# Patient Record
Sex: Male | Born: 1940 | Race: White | Hispanic: No | Marital: Married | Smoking: Never smoker
Health system: Southern US, Community
[De-identification: ages and names within clinical notes are randomized; demographics above are authoritative.]

## PROBLEM LIST (undated history)

## (undated) DIAGNOSIS — D631 Anemia in chronic kidney disease: Secondary | ICD-10-CM

## (undated) DIAGNOSIS — E669 Obesity, unspecified: Secondary | ICD-10-CM

## (undated) DIAGNOSIS — I272 Pulmonary hypertension, unspecified: Secondary | ICD-10-CM

## (undated) DIAGNOSIS — I10 Essential (primary) hypertension: Secondary | ICD-10-CM

## (undated) DIAGNOSIS — D649 Anemia, unspecified: Secondary | ICD-10-CM

## (undated) DIAGNOSIS — Z8679 Personal history of other diseases of the circulatory system: Secondary | ICD-10-CM

## (undated) DIAGNOSIS — K219 Gastro-esophageal reflux disease without esophagitis: Secondary | ICD-10-CM

## (undated) DIAGNOSIS — I5032 Chronic diastolic (congestive) heart failure: Secondary | ICD-10-CM

## (undated) DIAGNOSIS — R51 Headache: Secondary | ICD-10-CM

## (undated) DIAGNOSIS — N189 Chronic kidney disease, unspecified: Secondary | ICD-10-CM

## (undated) DIAGNOSIS — N183 Chronic kidney disease, stage 3 unspecified: Secondary | ICD-10-CM

## (undated) DIAGNOSIS — E291 Testicular hypofunction: Secondary | ICD-10-CM

## (undated) DIAGNOSIS — I2699 Other pulmonary embolism without acute cor pulmonale: Secondary | ICD-10-CM

## (undated) DIAGNOSIS — Z8719 Personal history of other diseases of the digestive system: Secondary | ICD-10-CM

## (undated) DIAGNOSIS — K279 Peptic ulcer, site unspecified, unspecified as acute or chronic, without hemorrhage or perforation: Secondary | ICD-10-CM

## (undated) DIAGNOSIS — R519 Headache, unspecified: Secondary | ICD-10-CM

## (undated) DIAGNOSIS — E271 Primary adrenocortical insufficiency: Secondary | ICD-10-CM

## (undated) DIAGNOSIS — I2510 Atherosclerotic heart disease of native coronary artery without angina pectoris: Secondary | ICD-10-CM

## (undated) DIAGNOSIS — E079 Disorder of thyroid, unspecified: Secondary | ICD-10-CM

## (undated) DIAGNOSIS — Z9289 Personal history of other medical treatment: Secondary | ICD-10-CM

## (undated) DIAGNOSIS — G8929 Other chronic pain: Secondary | ICD-10-CM

## (undated) DIAGNOSIS — K922 Gastrointestinal hemorrhage, unspecified: Secondary | ICD-10-CM

## (undated) DIAGNOSIS — K759 Inflammatory liver disease, unspecified: Secondary | ICD-10-CM

## (undated) DIAGNOSIS — M549 Dorsalgia, unspecified: Secondary | ICD-10-CM

## (undated) HISTORY — DX: Essential (primary) hypertension: I10

## (undated) HISTORY — DX: Anemia in chronic kidney disease: D63.1

## (undated) HISTORY — PX: TONSILLECTOMY: SUR1361

## (undated) HISTORY — DX: Personal history of other medical treatment: Z92.89

## (undated) HISTORY — DX: Chronic kidney disease, stage 3 unspecified: N18.30

## (undated) HISTORY — DX: Disorder of thyroid, unspecified: E07.9

## (undated) HISTORY — DX: Chronic kidney disease, stage 3 (moderate): N18.3

## (undated) HISTORY — DX: Chronic diastolic (congestive) heart failure: I50.32

## (undated) HISTORY — DX: Pulmonary hypertension, unspecified: I27.20

## (undated) HISTORY — PX: CHOLECYSTECTOMY: SHX55

## (undated) HISTORY — DX: Personal history of other diseases of the circulatory system: Z86.79

## (undated) HISTORY — DX: Chronic kidney disease, unspecified: N18.9

## (undated) HISTORY — DX: Primary adrenocortical insufficiency: E27.1

## (undated) HISTORY — DX: Testicular hypofunction: E29.1

## (undated) HISTORY — DX: Other pulmonary embolism without acute cor pulmonale: I26.99

## (undated) MED FILL — Dextrose Inj 5%: INTRAVENOUS | Qty: 1000 | Status: AC

---
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
## 1999-07-23 ENCOUNTER — Encounter: Admission: RE | Admit: 1999-07-23 | Discharge: 1999-07-23 | Payer: Self-pay | Admitting: Family Medicine

## 1999-07-23 ENCOUNTER — Encounter: Payer: Self-pay | Admitting: Family Medicine

## 1999-07-31 ENCOUNTER — Encounter: Admission: RE | Admit: 1999-07-31 | Discharge: 1999-07-31 | Payer: Self-pay | Admitting: Gastroenterology

## 1999-07-31 ENCOUNTER — Encounter: Payer: Self-pay | Admitting: Gastroenterology

## 1999-11-25 ENCOUNTER — Encounter: Payer: Self-pay | Admitting: Emergency Medicine

## 1999-11-25 ENCOUNTER — Emergency Department (HOSPITAL_COMMUNITY): Admission: EM | Admit: 1999-11-25 | Discharge: 1999-11-25 | Payer: Self-pay | Admitting: Emergency Medicine

## 1999-11-27 ENCOUNTER — Ambulatory Visit (HOSPITAL_COMMUNITY): Admission: RE | Admit: 1999-11-27 | Discharge: 1999-11-27 | Payer: Self-pay | Admitting: Internal Medicine

## 2000-12-06 ENCOUNTER — Encounter: Admission: RE | Admit: 2000-12-06 | Discharge: 2001-01-13 | Payer: Self-pay | Admitting: Obstetrics and Gynecology

## 2002-04-12 ENCOUNTER — Encounter: Payer: Self-pay | Admitting: Otolaryngology

## 2002-04-12 ENCOUNTER — Encounter: Admission: RE | Admit: 2002-04-12 | Discharge: 2002-04-12 | Payer: Self-pay | Admitting: Otolaryngology

## 2002-06-11 ENCOUNTER — Encounter: Payer: Self-pay | Admitting: Gastroenterology

## 2002-06-11 ENCOUNTER — Encounter: Admission: RE | Admit: 2002-06-11 | Discharge: 2002-06-11 | Payer: Self-pay | Admitting: Gastroenterology

## 2002-06-15 ENCOUNTER — Ambulatory Visit (HOSPITAL_COMMUNITY): Admission: RE | Admit: 2002-06-15 | Discharge: 2002-06-15 | Payer: Self-pay | Admitting: Gastroenterology

## 2002-06-15 ENCOUNTER — Encounter: Payer: Self-pay | Admitting: Gastroenterology

## 2002-08-13 ENCOUNTER — Ambulatory Visit (HOSPITAL_COMMUNITY): Admission: RE | Admit: 2002-08-13 | Discharge: 2002-08-13 | Payer: Self-pay | Admitting: Gastroenterology

## 2002-08-13 ENCOUNTER — Encounter: Payer: Self-pay | Admitting: Gastroenterology

## 2002-09-11 ENCOUNTER — Ambulatory Visit (HOSPITAL_COMMUNITY): Admission: RE | Admit: 2002-09-11 | Discharge: 2002-09-11 | Payer: Self-pay | Admitting: Endocrinology

## 2002-09-11 ENCOUNTER — Encounter: Payer: Self-pay | Admitting: Endocrinology

## 2003-04-15 ENCOUNTER — Emergency Department (HOSPITAL_COMMUNITY): Admission: EM | Admit: 2003-04-15 | Discharge: 2003-04-15 | Payer: Self-pay | Admitting: Emergency Medicine

## 2003-04-21 ENCOUNTER — Ambulatory Visit (HOSPITAL_COMMUNITY): Admission: RE | Admit: 2003-04-21 | Discharge: 2003-04-21 | Payer: Self-pay | Admitting: Otolaryngology

## 2003-06-28 ENCOUNTER — Ambulatory Visit (HOSPITAL_COMMUNITY): Admission: RE | Admit: 2003-06-28 | Discharge: 2003-06-28 | Payer: Self-pay | Admitting: Neurology

## 2003-09-14 ENCOUNTER — Emergency Department (HOSPITAL_COMMUNITY): Admission: EM | Admit: 2003-09-14 | Discharge: 2003-09-15 | Payer: Self-pay | Admitting: Emergency Medicine

## 2004-08-03 ENCOUNTER — Encounter: Admission: RE | Admit: 2004-08-03 | Discharge: 2004-08-03 | Payer: Self-pay | Admitting: Gastroenterology

## 2004-10-02 ENCOUNTER — Ambulatory Visit (HOSPITAL_COMMUNITY): Admission: RE | Admit: 2004-10-02 | Discharge: 2004-10-02 | Payer: Self-pay | Admitting: Gastroenterology

## 2004-10-02 ENCOUNTER — Encounter (INDEPENDENT_AMBULATORY_CARE_PROVIDER_SITE_OTHER): Payer: Self-pay | Admitting: *Deleted

## 2005-05-05 ENCOUNTER — Encounter: Admission: RE | Admit: 2005-05-05 | Discharge: 2005-05-05 | Payer: Self-pay | Admitting: Gastroenterology

## 2005-05-18 ENCOUNTER — Encounter: Admission: RE | Admit: 2005-05-18 | Discharge: 2005-05-18 | Payer: Self-pay | Admitting: Gastroenterology

## 2006-04-01 ENCOUNTER — Encounter: Admission: RE | Admit: 2006-04-01 | Discharge: 2006-04-01 | Payer: Self-pay | Admitting: Family Medicine

## 2006-04-05 ENCOUNTER — Encounter: Admission: RE | Admit: 2006-04-05 | Discharge: 2006-04-05 | Payer: Self-pay | Admitting: Family Medicine

## 2006-04-15 ENCOUNTER — Encounter: Admission: RE | Admit: 2006-04-15 | Discharge: 2006-04-15 | Payer: Self-pay | Admitting: General Surgery

## 2006-05-05 ENCOUNTER — Ambulatory Visit (HOSPITAL_COMMUNITY): Admission: RE | Admit: 2006-05-05 | Discharge: 2006-05-05 | Payer: Self-pay | Admitting: General Surgery

## 2006-05-05 ENCOUNTER — Encounter (INDEPENDENT_AMBULATORY_CARE_PROVIDER_SITE_OTHER): Payer: Self-pay | Admitting: Specialist

## 2006-05-18 ENCOUNTER — Encounter: Admission: RE | Admit: 2006-05-18 | Discharge: 2006-05-18 | Payer: Self-pay | Admitting: Surgery

## 2006-09-29 ENCOUNTER — Ambulatory Visit (HOSPITAL_COMMUNITY): Admission: RE | Admit: 2006-09-29 | Discharge: 2006-09-30 | Payer: Self-pay | Admitting: Endocrinology

## 2007-03-11 ENCOUNTER — Encounter: Admission: RE | Admit: 2007-03-11 | Discharge: 2007-03-11 | Payer: Self-pay | Admitting: Orthopedic Surgery

## 2007-03-21 ENCOUNTER — Ambulatory Visit (HOSPITAL_COMMUNITY): Admission: RE | Admit: 2007-03-21 | Discharge: 2007-03-22 | Payer: Self-pay | Admitting: Orthopedic Surgery

## 2007-04-30 ENCOUNTER — Ambulatory Visit: Payer: Self-pay | Admitting: Internal Medicine

## 2007-05-08 LAB — CBC WITH DIFFERENTIAL/PLATELET
BASO%: 1 % (ref 0.0–2.0)
Eosinophils Absolute: 0.6 10*3/uL — ABNORMAL HIGH (ref 0.0–0.5)
LYMPH%: 35.8 % (ref 14.0–48.0)
MCHC: 35.2 g/dL (ref 32.0–35.9)
MCV: 88.6 fL (ref 81.6–98.0)
MONO%: 8.8 % (ref 0.0–13.0)
Platelets: 275 10*3/uL (ref 145–400)
RBC: 5.36 10*6/uL (ref 4.20–5.71)

## 2007-05-08 LAB — COMPREHENSIVE METABOLIC PANEL
Alkaline Phosphatase: 83 U/L (ref 39–117)
Glucose, Bld: 107 mg/dL — ABNORMAL HIGH (ref 70–99)
Sodium: 139 mEq/L (ref 135–145)
Total Bilirubin: 0.5 mg/dL (ref 0.3–1.2)
Total Protein: 6.9 g/dL (ref 6.0–8.3)

## 2007-05-09 LAB — LEUKOCYTE ALKALINE PHOS: Leukocyte Alkaline  Phos Stain: 150 — ABNORMAL HIGH (ref 22–124)

## 2007-05-22 LAB — CBC WITH DIFFERENTIAL/PLATELET
Basophils Absolute: 0 10*3/uL (ref 0.0–0.1)
Eosinophils Absolute: 0.6 10*3/uL — ABNORMAL HIGH (ref 0.0–0.5)
HGB: 15.1 g/dL (ref 13.0–17.1)
MCV: 88.6 fL (ref 81.6–98.0)
MONO#: 0.8 10*3/uL (ref 0.1–0.9)
NEUT#: 4.9 10*3/uL (ref 1.5–6.5)
RDW: 13.6 % (ref 11.2–14.6)
WBC: 9.6 10*3/uL (ref 4.0–10.0)
lymph#: 3.3 10*3/uL (ref 0.9–3.3)

## 2007-06-08 ENCOUNTER — Ambulatory Visit (HOSPITAL_COMMUNITY): Admission: RE | Admit: 2007-06-08 | Discharge: 2007-06-08 | Payer: Self-pay | Admitting: Orthopedic Surgery

## 2008-02-15 ENCOUNTER — Encounter: Admission: RE | Admit: 2008-02-15 | Discharge: 2008-02-15 | Payer: Self-pay | Admitting: Family Medicine

## 2008-04-22 ENCOUNTER — Encounter: Admission: RE | Admit: 2008-04-22 | Discharge: 2008-04-22 | Payer: Self-pay | Admitting: Family Medicine

## 2008-06-20 ENCOUNTER — Inpatient Hospital Stay (HOSPITAL_COMMUNITY): Admission: EM | Admit: 2008-06-20 | Discharge: 2008-06-27 | Payer: Self-pay | Admitting: Emergency Medicine

## 2008-06-20 ENCOUNTER — Ambulatory Visit: Payer: Self-pay | Admitting: Cardiology

## 2008-06-21 ENCOUNTER — Encounter: Payer: Self-pay | Admitting: Internal Medicine

## 2008-06-21 ENCOUNTER — Encounter (INDEPENDENT_AMBULATORY_CARE_PROVIDER_SITE_OTHER): Payer: Self-pay | Admitting: Internal Medicine

## 2008-06-25 ENCOUNTER — Encounter: Payer: Self-pay | Admitting: Cardiovascular Disease

## 2008-06-26 HISTORY — PX: CARDIAC CATHETERIZATION: SHX172

## 2008-07-16 ENCOUNTER — Encounter
Admission: RE | Admit: 2008-07-16 | Discharge: 2008-08-06 | Payer: Self-pay | Admitting: Physical Medicine & Rehabilitation

## 2008-07-22 ENCOUNTER — Ambulatory Visit: Payer: Self-pay | Admitting: Physical Medicine & Rehabilitation

## 2008-08-01 ENCOUNTER — Encounter: Admission: RE | Admit: 2008-08-01 | Discharge: 2008-08-01 | Payer: Self-pay | Admitting: Anesthesiology

## 2008-08-06 ENCOUNTER — Ambulatory Visit: Payer: Self-pay | Admitting: Physical Medicine & Rehabilitation

## 2008-09-16 ENCOUNTER — Encounter: Admission: RE | Admit: 2008-09-16 | Discharge: 2008-09-16 | Payer: Self-pay | Admitting: Family Medicine

## 2009-03-11 ENCOUNTER — Ambulatory Visit: Payer: Self-pay | Admitting: Internal Medicine

## 2009-03-23 ENCOUNTER — Encounter: Admission: RE | Admit: 2009-03-23 | Discharge: 2009-03-23 | Payer: Self-pay | Admitting: Neurosurgery

## 2009-04-22 ENCOUNTER — Ambulatory Visit (HOSPITAL_COMMUNITY): Admission: RE | Admit: 2009-04-22 | Discharge: 2009-04-22 | Payer: Self-pay | Admitting: Internal Medicine

## 2009-04-22 ENCOUNTER — Encounter: Payer: Self-pay | Admitting: Internal Medicine

## 2009-05-08 ENCOUNTER — Ambulatory Visit: Payer: Self-pay | Admitting: Internal Medicine

## 2009-05-08 DIAGNOSIS — E039 Hypothyroidism, unspecified: Secondary | ICD-10-CM

## 2009-05-08 DIAGNOSIS — Z9189 Other specified personal risk factors, not elsewhere classified: Secondary | ICD-10-CM | POA: Insufficient documentation

## 2009-05-08 DIAGNOSIS — R0609 Other forms of dyspnea: Secondary | ICD-10-CM

## 2009-05-08 DIAGNOSIS — I5022 Chronic systolic (congestive) heart failure: Secondary | ICD-10-CM

## 2009-05-08 DIAGNOSIS — D510 Vitamin B12 deficiency anemia due to intrinsic factor deficiency: Secondary | ICD-10-CM

## 2010-01-30 ENCOUNTER — Ambulatory Visit: Payer: Self-pay | Admitting: Cardiovascular Disease

## 2010-02-16 ENCOUNTER — Encounter: Payer: Self-pay | Admitting: Family Medicine
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
## 2010-04-06 ENCOUNTER — Ambulatory Visit (INDEPENDENT_AMBULATORY_CARE_PROVIDER_SITE_OTHER): Payer: BC Managed Care – PPO | Admitting: Nurse Practitioner

## 2010-04-06 DIAGNOSIS — R0602 Shortness of breath: Secondary | ICD-10-CM

## 2010-04-06 DIAGNOSIS — I509 Heart failure, unspecified: Secondary | ICD-10-CM

## 2010-04-09 ENCOUNTER — Encounter: Payer: Self-pay | Admitting: Internal Medicine

## 2010-04-09 ENCOUNTER — Ambulatory Visit (HOSPITAL_COMMUNITY): Payer: BC Managed Care – PPO | Attending: Internal Medicine

## 2010-04-09 DIAGNOSIS — E2749 Other adrenocortical insufficiency: Secondary | ICD-10-CM | POA: Insufficient documentation

## 2010-04-09 DIAGNOSIS — I10 Essential (primary) hypertension: Secondary | ICD-10-CM | POA: Insufficient documentation

## 2010-04-09 DIAGNOSIS — R0989 Other specified symptoms and signs involving the circulatory and respiratory systems: Secondary | ICD-10-CM | POA: Insufficient documentation

## 2010-04-09 DIAGNOSIS — R0609 Other forms of dyspnea: Secondary | ICD-10-CM | POA: Insufficient documentation

## 2010-04-09 DIAGNOSIS — E039 Hypothyroidism, unspecified: Secondary | ICD-10-CM | POA: Insufficient documentation

## 2010-04-09 DIAGNOSIS — I519 Heart disease, unspecified: Secondary | ICD-10-CM | POA: Insufficient documentation

## 2010-04-09 DIAGNOSIS — I509 Heart failure, unspecified: Secondary | ICD-10-CM | POA: Insufficient documentation

## 2010-04-09 HISTORY — PX: US ECHOCARDIOGRAPHY: HXRAD669

## 2010-04-16 ENCOUNTER — Encounter: Payer: Self-pay | Admitting: Cardiovascular Disease

## 2010-04-16 ENCOUNTER — Ambulatory Visit (INDEPENDENT_AMBULATORY_CARE_PROVIDER_SITE_OTHER): Payer: BC Managed Care – PPO | Admitting: Cardiovascular Disease

## 2010-04-16 VITALS — BP 108/72 | HR 88 | Resp 20 | Wt 211.0 lb

## 2010-04-16 DIAGNOSIS — E291 Testicular hypofunction: Secondary | ICD-10-CM | POA: Insufficient documentation

## 2010-04-16 DIAGNOSIS — R5383 Other fatigue: Secondary | ICD-10-CM | POA: Insufficient documentation

## 2010-04-16 DIAGNOSIS — I509 Heart failure, unspecified: Secondary | ICD-10-CM

## 2010-04-16 DIAGNOSIS — R5381 Other malaise: Secondary | ICD-10-CM

## 2010-04-16 DIAGNOSIS — N289 Disorder of kidney and ureter, unspecified: Secondary | ICD-10-CM

## 2010-04-16 DIAGNOSIS — E869 Volume depletion, unspecified: Secondary | ICD-10-CM | POA: Insufficient documentation

## 2010-04-16 NOTE — Progress Notes (Signed)
History of Present Illness:  [name] is a middle-aged gentleman with a history of congestive heart failure and pulmonary hypertension. He also has Addison's disease, hypothyroidism, and hypogonadism. He also has a history of a pituitary tumor but was thought not to have complete pituitary failure. He was seen by [name] last week for fatigue and shortness of breath. His Lasix was increased from 40 mg to 60 mg a day. She ordered an echocardiogram which revealed normal left ventricular systolic function. Labwork at that time revealed a white blood cell count of 12.5 ( slightly elevated because of his steroid therapy), a creatinine of 1.99, BUN 27, and a B natruretic peptide of 2.2.   He presents today for followup.  Current medications: Note:  The patient was on Voltaren when he was seen today.  This medication was discontinued today at this visit. Current Outpatient Prescriptions  Medication Sig Dispense Refill  . aspirin 81 MG tablet Take 81 mg by mouth daily.        . carvedilol (COREG) 25 MG tablet Take 25 mg by mouth 2 (two) times daily with a meal.       . Cholecalciferol (VITAMIN D3 PO) Take by mouth.        . cyclobenzaprine (FLEXERIL) 10 MG tablet       . furosemide (LASIX) 40 MG tablet Take 60 mg by mouth daily.       . hydrocortisone (CORTEF) 20 MG tablet Take 20 mg by mouth daily.        . hydrOXYzine (VISTARIL) 25 MG capsule Take 25 mg by mouth 3 (three) times daily as needed.        Marland Kitchen lisinopril (PRINIVIL,ZESTRIL) 20 MG tablet Take 20 mg by mouth daily.        . metoCLOPramide (REGLAN) 10 MG tablet Take 10 mg by mouth 4 (four) times daily.        Marland Kitchen omeprazole (PRILOSEC) 20 MG capsule Take 20 mg by mouth daily.        . potassium chloride (K-DUR,KLOR-CON) 10 MEQ tablet Take 10 mEq by mouth 2 (two) times daily.        . promethazine (PHENERGAN) 25 MG tablet Take 25 mg by mouth every 6 (six) hours as needed.        . tadalafil (CIALIS) 5 MG tablet Take 5 mg by mouth daily as  needed.        . thyroid (ARMOUR) 60 MG tablet Take 60 mg by mouth daily.        . traMADol (ULTRAM) 50 MG tablet Take 50 mg by mouth every 6 (six) hours as needed.        . traZODone (DESYREL) 100 MG tablet Take 100 mg by mouth at bedtime.        Marland Kitchen DISCONTD: diclofenac (VOLTAREN) 75 MG EC tablet Take 75 mg by mouth 2 (two) times daily.           No Known Allergies  Past Medical History  Diagnosis Date  . CHF (congestive heart failure)   . Thyroid disease     addisons  . Pulmonary HTN   . Hypertension   . Addison's disease   . Hypogonadism male     Past Surgical History  Procedure Date  . Cholecystectomy     History  Smoking status  . Never Smoker   Smokeless tobacco  . Not on file    History  Alcohol Use No    History reviewed. No pertinent family  history.  Review of Systems: The patient denies any heat or cold intolerance.  No weight gain or weight loss.  The patient denies headaches or blurry vision.  There is no cough or sputum production.  The patient denies dizziness.  There is no hematuria or hematochezia.  The patient denies any muscle aches or arthritis.  The patient denies any rash.  The patient denies frequent falling or instability.  There is no history of depression or anxiety.  All other systems were reviewed and are negative.  Physical Exam: BP 108/72  Pulse 88  Resp 20  The patient is alert and oriented x 3.  The mood and affect are normal.  The skin is warm and dry.  Color is normal.  The HEENT exam reveals that the sclera are nonicteric.  The mucous membranes are dryer than normal.  The carotids are 2+ without bruits.  There is no thyromegaly.  There is no JVD.  The lungs are clear.  The chest wall is non tender.  The heart exam reveals a regular rate with a normal S1 and S2.  There are no murmurs, gallops, or rubs.  The PMI is not displaced.   Abdominal exam reveals good bowel sounds.  There is no guarding or rebound.  There is no hepatosplenomegaly  or tenderness.  There are no masses.  Exam of the legs reveal no clubbing, cyanosis, or edema. Skin turgor is slightly diminished.   The legs are without rashes.  The distal pulses are intact.  Cranial nerves II - XII are intact.  Motor and sensory functions are intact.  The gait is normal.  Have reviewed his echocardiogram from April 09, 1998. He reveals normal left ventricular systolic function with an ejection fraction of 60-65%. His previous echocardiogram from 2000 and reveals an ejection fraction of 30-35%. This was corroborated by left ventriculogram at the time of heart catheterization in 2010  Labs: Creatinine of 1.99, BUN equals 27,BNP is 2.2. Assessment / Plan:

## 2010-04-16 NOTE — Patient Instructions (Signed)
Hold Furosemide, potassium, and voltaren.

## 2010-04-16 NOTE — Assessment & Plan Note (Signed)
[name] presents with fatigue, dehydration, elevated creatinine, and a very low BNP. He's been thirsty for the past several weeks. I suspect that he is active volume depleted. This is the most likely cause of his renal insufficiency. He's also on Voltaren which can cause renal insufficiency. We will discontinue the Voltaren.We will hold the Lasix and potassium. I'll see him in 2 weeks for office visit, basic metabolic profile, and BNP.

## 2010-04-16 NOTE — Assessment & Plan Note (Signed)
His congestive heart failure has resolved. His left ventricular systolic function has improved from 35% to 60% on medical therapy.  We will continue with his same medications since they have improved his left ventricular systolic function.

## 2010-04-16 NOTE — Assessment & Plan Note (Signed)
I suspect that his profound fatigue is due to volume depletion and renal insufficiency. We have discontinued the Voltaren and we'll hold the Lasix and potassium for the time being to see if this helps.It's also possible that he needs additional cortisol for his Addison's disease. We'll see if he improves with correction of his volume and will have him see his endocrinologist if needed.

## 2010-04-16 NOTE — Assessment & Plan Note (Signed)
The Lasix has been held. We will also hold his potassium.

## 2010-05-04 ENCOUNTER — Encounter: Payer: Self-pay | Admitting: Cardiovascular Disease

## 2010-05-04 ENCOUNTER — Encounter: Payer: Self-pay | Admitting: *Deleted

## 2010-05-04 ENCOUNTER — Other Ambulatory Visit: Payer: Self-pay | Admitting: Cardiovascular Disease

## 2010-05-04 DIAGNOSIS — I509 Heart failure, unspecified: Secondary | ICD-10-CM

## 2010-05-04 DIAGNOSIS — R0602 Shortness of breath: Secondary | ICD-10-CM

## 2010-05-04 LAB — POCT I-STAT 3, VENOUS BLOOD GAS (G3P V)
Bicarbonate: 21.7 mEq/L (ref 20.0–24.0)
O2 Saturation: 57 %
TCO2: 23 mmol/L (ref 0–100)
pCO2, Ven: 36 mmHg — ABNORMAL LOW (ref 45.0–50.0)
pH, Ven: 7.388 — ABNORMAL HIGH (ref 7.250–7.300)
pO2, Ven: 30 mmHg (ref 30.0–45.0)

## 2010-05-04 LAB — CBC
MCHC: 33.4 g/dL (ref 30.0–36.0)
Platelets: 340 10*3/uL (ref 150–400)
Platelets: 352 10*3/uL (ref 150–400)
RDW: 17.3 % — ABNORMAL HIGH (ref 11.5–15.5)
RDW: 17.7 % — ABNORMAL HIGH (ref 11.5–15.5)
WBC: 14.8 10*3/uL — ABNORMAL HIGH (ref 4.0–10.5)

## 2010-05-04 LAB — BASIC METABOLIC PANEL
BUN: 10 mg/dL (ref 6–23)
BUN: 20 mg/dL (ref 6–23)
CO2: 24 mEq/L (ref 19–32)
CO2: 28 mEq/L (ref 19–32)
Calcium: 8.6 mg/dL (ref 8.4–10.5)
Chloride: 104 mEq/L (ref 96–112)
Creatinine, Ser: 0.84 mg/dL (ref 0.4–1.5)
Creatinine, Ser: 1.37 mg/dL (ref 0.4–1.5)
GFR calc non Af Amer: >60 mL/min (ref >60)
Glucose, Bld: 86 mg/dL (ref 70–99)
Potassium: 4.5 mEq/L (ref 3.5–5.1)
Sodium: 140 mEq/L (ref 135–145)

## 2010-05-04 LAB — POCT I-STAT 3, ART BLOOD GAS (G3+)
O2 Saturation: 96 %
pCO2 arterial: 29.9 mmHg — ABNORMAL LOW (ref 35.0–45.0)
pH, Arterial: 7.435 (ref 7.350–7.450)
pO2, Arterial: 75 mmHg — ABNORMAL LOW (ref 80.0–100.0)

## 2010-05-05 ENCOUNTER — Encounter: Payer: Self-pay | Admitting: Cardiovascular Disease

## 2010-05-05 ENCOUNTER — Ambulatory Visit (INDEPENDENT_AMBULATORY_CARE_PROVIDER_SITE_OTHER): Payer: BC Managed Care – PPO | Admitting: Cardiovascular Disease

## 2010-05-05 VITALS — BP 120/80 | HR 90 | Wt 209.0 lb

## 2010-05-05 DIAGNOSIS — E860 Dehydration: Secondary | ICD-10-CM

## 2010-05-05 DIAGNOSIS — I509 Heart failure, unspecified: Secondary | ICD-10-CM

## 2010-05-05 DIAGNOSIS — R0602 Shortness of breath: Secondary | ICD-10-CM

## 2010-05-05 LAB — T4, FREE: Free T4: 0.65 ng/dL — ABNORMAL LOW (ref 0.80–1.80)

## 2010-05-05 LAB — CBC
HCT: 43.5 % (ref 39.0–52.0)
HCT: 45.1 % (ref 39.0–52.0)
Hemoglobin: 14.3 g/dL (ref 13.0–17.0)
Hemoglobin: 14.5 g/dL (ref 13.0–17.0)
Hemoglobin: 15.1 g/dL (ref 13.0–17.0)
Hemoglobin: 15.8 g/dL (ref 13.0–17.0)
MCHC: 32.9 g/dL (ref 30.0–36.0)
MCHC: 33.3 g/dL (ref 30.0–36.0)
MCHC: 33.3 g/dL (ref 30.0–36.0)
MCHC: 33.4 g/dL (ref 30.0–36.0)
MCV: 86.9 fL (ref 78.0–100.0)
MCV: 87.3 fL (ref 78.0–100.0)
RBC: 4.97 MIL/uL (ref 4.22–5.81)
RBC: 5.5 MIL/uL (ref 4.22–5.81)
RDW: 17.1 % — ABNORMAL HIGH (ref 11.5–15.5)
RDW: 17.2 % — ABNORMAL HIGH (ref 11.5–15.5)
RDW: 17.4 % — ABNORMAL HIGH (ref 11.5–15.5)
WBC: 11.7 10*3/uL — ABNORMAL HIGH (ref 4.0–10.5)

## 2010-05-05 LAB — POCT I-STAT 3, ART BLOOD GAS (G3+)
Bicarbonate: 21.4 mEq/L (ref 20.0–24.0)
Patient temperature: 97.4
TCO2: 23 mmol/L (ref 0–100)
pCO2 arterial: 37 mmHg (ref 35.0–45.0)
pH, Arterial: 7.368 (ref 7.350–7.450)

## 2010-05-05 LAB — DIFFERENTIAL
Basophils Absolute: 0.1 10*3/uL (ref 0.0–0.1)
Basophils Relative: 0 % (ref 0–1)
Basophils Relative: 1 % (ref 0–1)
Eosinophils Absolute: 0.5 10*3/uL (ref 0.0–0.7)
Lymphs Abs: 3 10*3/uL (ref 0.7–4.0)
Monocytes Absolute: 0.9 10*3/uL (ref 0.1–1.0)
Monocytes Relative: 7 % (ref 3–12)
Monocytes Relative: 7 % (ref 3–12)
Neutro Abs: 8.1 10*3/uL — ABNORMAL HIGH (ref 1.7–7.7)
Neutrophils Relative %: 50 % (ref 43–77)

## 2010-05-05 LAB — APTT: aPTT: 35 seconds (ref 24–37)

## 2010-05-05 LAB — COMPREHENSIVE METABOLIC PANEL
ALT: 15 U/L (ref 0–53)
Alkaline Phosphatase: 72 U/L (ref 39–117)
CO2: 26 mEq/L (ref 19–32)
Chloride: 107 mEq/L (ref 96–112)
Glucose, Bld: 98 mg/dL (ref 70–99)
Potassium: 5 mEq/L (ref 3.5–5.1)
Sodium: 140 mEq/L (ref 135–145)
Total Bilirubin: 1.2 mg/dL (ref 0.3–1.2)
Total Protein: 6.8 g/dL (ref 6.0–8.3)

## 2010-05-05 LAB — BASIC METABOLIC PANEL
BUN: 19 mg/dL (ref 6–23)
CO2: 25 mEq/L (ref 19–32)
CO2: 28 mEq/L (ref 19–32)
Calcium: 8.4 mg/dL (ref 8.4–10.5)
Calcium: 8.7 mg/dL (ref 8.4–10.5)
Chloride: 108 mEq/L (ref 96–112)
Creatinine, Ser: 1.9 mg/dL — ABNORMAL HIGH (ref 0.4–1.5)
GFR calc Af Amer: >60 mL/min (ref >60)
GFR: 37.97 mL/min — ABNORMAL LOW (ref >60.00)
Glucose, Bld: 115 mg/dL — ABNORMAL HIGH (ref 70–99)
Potassium: 4.3 mEq/L (ref 3.5–5.1)
Sodium: 140 mEq/L (ref 135–145)
Sodium: 142 mEq/L (ref 135–145)

## 2010-05-05 LAB — HEMOGLOBIN A1C
Hgb A1c MFr Bld: 6.1 % (ref 4.6–6.1)
Mean Plasma Glucose: 128 mg/dL

## 2010-05-05 LAB — LIPID PANEL
Cholesterol: 180 mg/dL (ref 0–200)
HDL: 31.3 mg/dL — ABNORMAL LOW (ref >39.00)
LDL Cholesterol: 92 mg/dL (ref 0–99)
LDL Cholesterol: 123 mg/dL — ABNORMAL HIGH (ref 0–99)
Total CHOL/HDL Ratio: 5.8 RATIO
Triglycerides: 133 mg/dL (ref <150)
Triglycerides: 128 mg/dL (ref 0.0–149.0)
VLDL: 27 mg/dL (ref 0–40)
VLDL: 25.6 mg/dL (ref 0.0–40.0)

## 2010-05-05 LAB — HEPATIC FUNCTION PANEL: Total Bilirubin: 0.6 mg/dL (ref 0.3–1.2)

## 2010-05-05 LAB — CK TOTAL AND CKMB (NOT AT ARMC): Total CK: 53 U/L (ref 7–232)

## 2010-05-05 LAB — PROTIME-INR: INR: 1.1 (ref 0.00–1.49)

## 2010-05-05 LAB — CARDIAC PANEL(CRET KIN+CKTOT+MB+TROPI)
Relative Index: INVALID (ref 0.0–2.5)
Troponin I: 0.03 ng/mL (ref 0.00–0.06)

## 2010-05-05 NOTE — Assessment & Plan Note (Signed)
[name] seems to be doing quite a bit better since we held his Lasix and potassium. We also had him hold his Voltaren. Hopefully his renal function has improved. He has labs were drawn today that are currently pending. We will have him continue to hold the Lasix and potassium for now. 

## 2010-05-05 NOTE — Assessment & Plan Note (Signed)
[name] seems to be doing quite a bit better since we held his Lasix and potassium. We also had him hold his Voltaren. Hopefully his renal function has improved. He has labs were drawn today that are currently pending. We will have him continue to hold the Lasix and potassium for now.

## 2010-05-05 NOTE — Assessment & Plan Note (Signed)
His congestive heart failure has resolved. His ejection fraction is now 60-65%. We will continue with his same medications.

## 2010-05-05 NOTE — Progress Notes (Signed)
History of Present Illness:  [name] is a middle-aged gentleman with a history of congestive heart failure and adrenal insufficiency. He presented to me several weeks ago with symptoms consistent with dehydration and possible adrenal insufficiency.  It severe weakness and shortness breath. He had worsening renal insufficiency. We discontinued his Voltaren. We held his Lasix and potassium.  He's feeling much better since that time.  Current Outpatient Prescriptions on File Prior to Visit  Medication Sig Dispense Refill  . aspirin 81 MG tablet Take 81 mg by mouth daily.        . carvedilol (COREG) 25 MG tablet Take 25 mg by mouth 2 (two) times daily with a meal.       . Cholecalciferol (VITAMIN D3 PO) Take by mouth.        . cyclobenzaprine (FLEXERIL) 10 MG tablet       . hydrocortisone (CORTEF) 20 MG tablet Take 20 mg by mouth daily.        . hydrOXYzine (VISTARIL) 25 MG capsule Take 25 mg by mouth 3 (three) times daily as needed.        Marland Kitchen lisinopril (PRINIVIL,ZESTRIL) 20 MG tablet Take 20 mg by mouth daily.        . metoCLOPramide (REGLAN) 10 MG tablet Take 10 mg by mouth 4 (four) times daily.        Marland Kitchen omeprazole (PRILOSEC) 20 MG capsule Take 20 mg by mouth daily.        . promethazine (PHENERGAN) 25 MG tablet Take 25 mg by mouth every 6 (six) hours as needed.        . tadalafil (CIALIS) 5 MG tablet Take 5 mg by mouth daily as needed.        . thyroid (ARMOUR) 60 MG tablet Take 60 mg by mouth daily.        . traMADol (ULTRAM) 50 MG tablet Take 50 mg by mouth every 6 (six) hours as needed.        . traZODone (DESYREL) 100 MG tablet Take 100 mg by mouth at bedtime.        . furosemide (LASIX) 40 MG tablet Take 60 mg by mouth daily.       . potassium chloride (K-DUR,KLOR-CON) 10 MEQ tablet Take 10 mEq by mouth 2 (two) times daily.         Please note that the Lasix and potassium Listed above  have been on hold for the past 2 weeks. No Known Allergies  Past Medical History  Diagnosis Date  .  CHF (congestive heart failure)   . Thyroid disease     addisons  . Pulmonary HTN   . Hypertension   . Addison's disease   . Hypogonadism male   . Renal insufficiency     Past Surgical History  Procedure Date  . Cholecystectomy     History  Smoking status  . Never Smoker   Smokeless tobacco  . Not on file    History  Alcohol Use No    No family history on file.  Reviw of Systems:  Noted in the history of present illness. All other systems are negative. Physical Exam: BP 120/80  Pulse 90  Wt 209 lb (94.802 kg) The patient is alert and oriented x 3.  The mood and affect are normal.  The skin is warm and dry.  Color is normal.  The HEENT exam reveals that the sclera are nonicteric.  The mucous membranes are moist.  The carotids are 2+ without  bruits.  There is no thyromegaly.  There is no JVD.  The lungs are clear.  The chest wall is non tender.  The heart exam reveals a regular rate with a normal S1 and S2.  There are no murmurs, gallops, or rubs.  The PMI is not displaced.   Abdominal exam reveals good bowel sounds.  There is no guarding or rebound.  There is no hepatosplenomegaly or tenderness.  There are no masses.  Exam of the legs reveal no clubbing, cyanosis, or edema.  The legs are without rashes.  The distal pulses are intact.  Cranial nerves II - XII are intact.  Motor and sensory functions are intact.  The gait is normal.  A recent echocardiogram from March 15 reveals a normal left ventricular systolic function with an ejection fraction of 60-65%. He has hypokinesis of the basal inferior and inferoseptal walls. He has mild left ventricular he was found to have trivial aortic insufficiency. His pulmonary artery pressures were normal  Assessment / Plan:

## 2010-05-07 ENCOUNTER — Telehealth: Payer: Self-pay | Admitting: Cardiovascular Disease

## 2010-05-07 NOTE — Telephone Encounter (Signed)
Patient called with lab results. Pt informed to call dr Tenny Craw and set up app to address kidney issues, labs sent to pcp.  Pt verbalized understanding. Alfonso Ramus RN

## 2010-05-11 ENCOUNTER — Other Ambulatory Visit: Payer: Self-pay | Admitting: Family Medicine

## 2010-05-11 DIAGNOSIS — N289 Disorder of kidney and ureter, unspecified: Secondary | ICD-10-CM

## 2010-05-19 ENCOUNTER — Ambulatory Visit
Admission: RE | Admit: 2010-05-19 | Discharge: 2010-05-19 | Disposition: A | Discharge status: Home / Self Care | Payer: BC Managed Care – PPO | Source: Ambulatory Visit | Attending: Family Medicine | Admitting: Family Medicine

## 2010-05-19 DIAGNOSIS — N289 Disorder of kidney and ureter, unspecified: Secondary | ICD-10-CM

## 2010-06-09 NOTE — H&P (Signed)
NAMESHANNON, KIRKENDALL             ACCOUNT NO.:  [id number]   MEDICAL RECORD NO.:  [id number]          PATIENT TYPE:  EMS   LOCATION:  [facility]                         FACILITY:  [facility]   PHYSICIAN:  Michiel Cowboy, MDDATE OF BIRTH:  [birth year]   DATE OF ADMISSION:  06/20/2008  DATE OF DISCHARGE:                              HISTORY & PHYSICAL   PRIMARY CARE PHYSICIAN:  C. Duane Lope, M.D.   CHIEF COMPLAINT:  Shortness of breath.   The patient is a 70 year old gentleman with past medical history  significant for Addison disease, anxiety, GERD, chronic pain who  presents with a few days history of worsening shortness of breath.  He  had been having a lot of congestion lately, initially was able to blow  his nose and now cannot which started to make him feel more and more  short of breath.  He initially scheduled appointment with ENT because he  was very worried that there was something stuck in his nose. ENT thought  that maybe he has a pneumonia and sent to emergency department for  further evaluation.  Here a chest x-ray showed a questionable perihilar  infiltrate.  Of note, the patient is hypoxic on ambulation.  He is short  of breath somewhat at baseline.  No chest pain or pleuritic chest pain  and chest discomfort at all, no nausea, vomiting.  The patient has been  very constipated but looking back it has been a chronic problem.  He  states that he has to use either stool softeners to have a bowel  movement but the patient has a longstanding history of narcotic use.   No fevers, no chills.  He had been having cough but lately became  nonproductive.   Otherwise review of systems only significant for neck pain which he has  chronically.   SOCIAL HISTORY:  The patient does not smoke or drink.   FAMILY HISTORY:  Noncontributory.   ALLERGIES:  No known drug allergies.   MEDICATIONS:  1. Synthroid dose unknown.  2. Testosterone 0.5 mg q. Week.  3. Reglan dose unknown.  4.  OxyContin 40 mg p.o. t.i.d.  5. Diclofenac twice a day.  6. Prevacid.  7. Clonazepam unclear dose, but he takes half a dose in p.m. and twice      as much before bedtime.  8. Aspirin 81 mg p.o. daily.  9. Phenergan.  10.Misoprostol at night.  11.Finasteride.  12.Hydrocortisone down to 20 mg p.o. daily.   ALLERGIES:  NO KNOWN DRUG ALLERGIES.   PHYSICAL EXAMINATION:  VITALS:  Temperature 97.0, blood pressure  155/102, respirations 22, heart rate 110s.  He is saturating 94% on 2  liters.  GENERAL:  The patient appears to be currently actually no acute  distress.  HEAD:  Nontraumatic.  The patient appears anxious, somewhat dryish  mucous membranes but normal skin turgor.  LUNGS:  Clear to auscultation bilaterally.  HEART:  Regular rate and rhythm but somewhat rapid.  No murmurs could be  appreciated.  ABDOMEN:  Obese, soft, nontender.  LOWER EXTREMITIES: Without clubbing, cyanosis or edema.  NEUROLOGY:  The patient intact.  SKIN:  Appears to be intact.   LABORATORY DATA:  White blood cell count 11.7 and hemoglobin 15.8,  sodium 140, potassium 5.0, creatinine 1.4.  LFTs within normal limits.  Chest x-ray showing questionable left perihilar infiltrate.  EKG showing  early repolarization in lead V2 and V3, poor baseline with possible T-  wave flattening in lead II.   ASSESSMENT AND PLAN:  This is a 70 year old gentleman with anxiety,  Addison disease presenting with shortness of breath, tachycardia and  questionable pneumonia.  1. Pneumonia.  Will cover for right now with Rocephin and Zithromax.      Will give p.r.n. Xopenex and avoid albuterol.  2. Shortness of breath, can be explained only by pneumonia as this is      a very small infiltrate.  We will check D-dimer.  Check BNP.  Will      also cycle cardiac enzymes.  3. Tachycardia.  Will check D-dimer.  Will also check TSH.  4. History of Addison disease.  Check orthostatics.  The patient does      not show any symptoms  concerning for Addison crisis at this point,      actually somewhat hypertensive.  Since he is admitted for possible      pneumonia we will increase dose of hydrocortisone.  5. Hypertension.  The patient does not necessarily carry diagnosis of      hypertension.  Rather has elevated blood pressure currently.  Will      give hydralazine p.r.n. to see if we can improve his blood pressure      but given his regular dose of OxyContin and Klonopin as he has not      gotten it recently and acting very anxious.  6. Prophylaxis.  Protonix plus Lovenox.  7. Constipation.  Will give MiraLax and Colace.  8. Chronic pain.  Continue his home dose of p.o. medications.      Michiel Cowboy, MD  Electronically Signed     AVD/MEDQ  D:  06/20/2008  T:  06/20/2008  Job:  [id number]   cc:   Dr. Duane Lope

## 2010-06-09 NOTE — Op Note (Signed)
[name], [name]             ACCOUNT NO.:  [id number]   MEDICAL RECORD NO.:  [id number]          PATIENT TYPE:  OIB   LOCATION:  5037                         FACILITY:  [facility]   PHYSICIAN:  [name], M.D.DATE OF BIRTH:  08-Mar-1940   DATE OF PROCEDURE:  03/21/2007  DATE OF DISCHARGE:                               OPERATIVE REPORT   PREOPERATIVE DIAGNOSIS:  Partial tear rotator cuff left shoulder;  impingement syndrome left shoulder; osteoarthritis AC joint left  shoulder.   POSTOPERATIVE DIAGNOSIS:  Torn labrum and partial tear rotator cuff left  shoulder; impingement syndrome with inferior bone spurs acromion;  osteoarthritis, left AC joint.   OPERATION:  Arthroscopic debridement labrum and debride partial tear of  articular surface rotator cuff; arthroscopic subacromial decompression;  distal clavicle resection through the arthroscope.   SURGEON:  [name]. [name], M.D.   ASSISTANT:  [name], P.A.-C.   ANESTHESIA:  General.   PROCEDURE:  The patient placed on the operating table flat on his back.  After general anesthesia and intubation, the patient placed in semi-  sitting position.  Left upper extremity and shoulder was then prepped  with DuraPrep, draped out in the usual manner.  Through a standard  posterior portal, arthroscope was introduced into the shoulder.  A  standard anterior operative portal was then made.  Very careful  examination of the shoulder was undertaken.  There was fairly normal  articular cartilage over the glenoid and labrum.  There was fraying of  the superior margin of the labrum where it attached to the glenoid but  was not unstable.  The biceps itself appeared normal.  On either side of  the biceps as it exited, there was fraying and tearing of the articular  surface of the rotator cuff but a full thickness tear was not seen.  Through the anterior operative portal, the intra-articular shaver was  introduced.  Superior  margin of the labrum was debrided and the  undersurface of partial tear of the rotator cuff was also debrided  anterior and posterior to the biceps itself.  The arthroscope was then  placed in subacromial space and there was fraying of the surface of the  rotator cuff but no full thickness tear and there was fraying on the  undersurface of acromion.  ArthroCare wand was inserted through a  lateral portal and the undersurface acromion and distal clavicle had  soft tissue resected.  Bleeders were coagulated with the articular wand.  Excellent visualization was achieved.  Through the lateral portal,  a 6  mm bur was inserted and there was a very large bone spur just adjacent  to the [facility] joint on the undersurface of the acromion, this was debrided  back.  An anterior acromioplasty was done and excellent decompression  was achieved.  Then the undersurface of the clavicle was debrided and  the joint itself could then clearly be seen.  A spinal needle was placed  anteriorly to enter the [facility] joint in a parallel manner.  The large 6 mm  bur was then inserted through the anterior portal and distal end of the  clavicle was resected.  Excellent decompression of the distal clavicle  was achieved from anterior to posterior.  Bleeders were coagulated  throughout the procedure which the ArthroCare wand.  I was very pleased  with the surgical outcome once this was completed.  Sutures used to  close the skin and sterile dressing applied and the patient returned to  the recovery room in excellent condition.  Technically this went  extremely well.   DISPOSITION:  1. To my office on Monday.  2. Percocet for pain.  3. He was given a shoulder block.  4. Sling.  5. Usual postop instructions were given.      Dennis A. [name], M.D.  Electronically Signed     RAM/MEDQ  D:  03/21/2007  T:  03/22/2007  Job:  [id number]

## 2010-06-09 NOTE — Cardiovascular Report (Signed)
NAMELAVERN, MASLOW             ACCOUNT NO.:  [id number]   MEDICAL RECORD NO.:  [id number]          PATIENT TYPE:  INP   LOCATION:  2040                         FACILITY:  [facility]   PHYSICIAN:  Vesta Mixer, M.D. DATE OF BIRTH:  14-Feb-1940   DATE OF PROCEDURE:  06/26/2008  DATE OF DISCHARGE:                            CARDIAC CATHETERIZATION   [name] is a 70 year old gentleman with recent hospitalization  for congestive heart failure.  He has been using Afrin nose spray for  the past 40 years.  He had an echocardiogram, which revealed moderate-to-  severe left ventricular dysfunction.  He is referred for heart  catheterization for further evaluation.   The procedure was a right and left heart catheterization with coronary  angiography and left ventriculography.   The right femoral artery and the right femoral vein were easily  cannulated using a modified Seldinger technique.   HEMODYNAMICS:  RA pressure is 13.  RV pressure is 45/15 with a mean of  15.  Pulmonary capillary wedge pressure is 36/35 with a mean of 34.  The  pulmonary artery pressure is 42/35 with a mean of 35.   The left ventricular end-diastolic pressure is 31.   The LV pressure is 128/22.  The aortic pressure is 134/96.   Cardiac output by thermodilution is 3.62 L.  This is an index of 1.66.  The cardiac output by Fick calculation is 3.57.  This is 1.64 index.  The aortic saturation was 96%.  The PA saturation is 57%.   Calculated Wood units are less than 1.   Angiography:  The left ventriculogram revealed a markedly depressed left  ventricular systolic function.  The ejection fraction is 30-35%.   Angiography left main:  The left main is fairly smooth and normal.  It  is fairly short.   The left anterior descending artery is smooth and normal throughout its  course.  It gives off a moderate-sized diagonal branch, which is normal.   There is a large ramus intermediate vessel, which is normal.   The left  circumflex artery is a large vessel.  The obtuse marginal artery is  smooth and normal.   The right coronary artery is large and is dominant.  There is a 50%  stenosis in the proximal aspect of the posterior descending artery.  The  posterolateral segment artery is normal.   COMPLICATIONS:  None.   CONCLUSIONS:  1. Minimal coronary artery irregularities.  He has a 50% stenosis in      the posterior descending artery.  We will treat this medically.  2. Markedly depressed left ventricular systolic function.  He has an      ejection fraction of 30-35%.  He has a markedly elevated left      ventricular end-diastolic pressure with a mean of about 34.  This      is causing secondary pulmonary hypertension.  His Wood units are      normal.   We will continue with aggressive medical management.  We are just  getting him started on carvedilol and ACE inhibitor.  He is to avoid  Afrin  and other nasal sprays.  We will plan on seeing him in the office  in a week or 2.      Vesta Mixer, M.D.  Electronically Signed     PJN/MEDQ  D:  06/26/2008  T:  06/26/2008  Job:  [id number]   cc:   Miguel Aschoff, M.D.
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
## 2010-06-12 NOTE — Discharge Summary (Signed)
[name], [name]             ACCOUNT NO.:  [id number]   MEDICAL RECORD NO.:  [id number]          PATIENT TYPE:  INP   LOCATION:  2040                         FACILITY:  [facility]   PHYSICIAN:  Kela Millin, M.D.DATE OF BIRTH:  04-01-1940   DATE OF ADMISSION:  06/20/2008  DATE OF DISCHARGE:  06/27/2008                               DISCHARGE SUMMARY   DISCHARGE DIAGNOSIS:  1. Pneumonia, community-acquired.  2. Moderate-to-severe left ventricular dysfunction, ejection fraction      30 to 35%.  3. 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  4. 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  The patient to follow up with Dr.      Talmage Nap as directed for further evaluation and management.  5. History of Addison's disease.  6. Hypothyroidism, under-treated.  His thyroid replacement dose      increased.  7. History of hypogonadotropic.  8. Hypertension.  9. History of chronic pain.   PROCEDURE/STUDIES:  1. A CT angiogram of chest.  No evidence for pulmonary embolism.  Mild      aneurysmal dilatation of the ascending thoracic aorta.  Moderate      bronchitic changes with left perihilar infiltrate.  Right basilar      pleural effusions and atelectasis.  Prominent lymph nodes with a      single enlarged subcarinal node, question reactive, in light of      their observed infiltrate.  Left perihilar infiltrate in the      superior segment of the left lower lobe.  Diffuse peribronchial      thickening, mild-to-moderate degree.  2. MRI of brain:  A 7 mm area of relatively delayed enhancement in the      left side of the pituitary gland, could represent a pituitary      adenoma, measuring 7 mm.  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  No      other acute intracranial abnormality.  Moderate atrophy and white      matter disease, advanced for age.  3. Cardiac catheterization:  Per Dr. Elease Hashimoto on June 26, 2008.  Minimal      coronary artery irregularities.  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  Markedly      depressed left ventricular systolic function.  He has an ejection      fraction of 30-35%.  He has a markedly elevated left ventricular      end-diastolic pressure with a mean of about 34.  This is causing      secondary pulmonary hypertension.  His Wood units are normal.  The      patient started on carvedilol and ACE inhibitor.  Will continue      aggressive medical management.  He is to avoid Afrin and other      nasal sprays.  4. 2-D echocardiogram:  Initial one on Jun 13, 2008.  Estimated      ejection fraction about 30%.  Cannot rule out apical clot.  5. Repeat 2-D echo on June 25, 2008.  Ejection fraction 40-45%.  No  evidence of thrombosis.   CONSULTATIONS:  Cardiology, Dr. Elease Hashimoto.   BRIEF HISTORY:  The patient is a 70 year old white male with the above-  listed medical problems who presented with a history of worsening  shortness of breath for a few days.  He reported that he had been having  a lot of congestion and initially was able to get some relief by blowing  his nose but got to a point where he could not, and that made him feel  more and more short of breath.  He initially schedule an appointment  with the ENT because he was worried there was something stuck in his  nose.  He saw the ENT doctor, who thought that the patient might have a  pneumonia and sent him to the ED for further evaluation and management.  A chest x-ray showed a possible perihilar infiltrate.  The patient was  noted to be hypoxic with ambulation.  He denied chest pain, pleuritic  pain, and no chest discomfort.  He complained of constipation, and it  was noted that it was a chronic problem.  He was admitted for further  evaluation and management.  It was noted that the patient was on  longstanding pain medications and had the problems with constipation on  a chronic basis.   Please see the full admission history and physical  dictated on Jun 20, 2008 by Dr. Adela Glimpse for the details of the admission physical exam as  well as the laboratory data.   HOSPITAL COURSE:  1. Perihilar pneumonia, community-acquired:  Upon admission he was      started on empiric antibiotics.  He was also placed on supplemental      oxygen.  He had a follow-up CT angiogram of his chest while in the      hospital, and the results as stated above, confirmed pneumonia.      With antibiotic treatment, his symptoms improved, and he was      oxygenating well on room air.  His leukocytosis was noted to      increase while he was in the hospital, but he had been put on a      decreased dose of steroids, given his history of Addison's disease      on chronic steroids.  He completed his antibiotic course during his      hospital stay and was to follow up with his primary care physician.  2. Severe LV dysfunction:  As part of the workup for his dyspnea, a      BNP was done on admission, and it was mildly elevated at 360.  A 2-      D echocardiogram was ordered to follow up on this, and the results      as stated above.  Following this, cardiology was consulted, and Dr.      Elease Hashimoto saw the patient.  A repeat 2-D echocardiogram was done,      which confirmed his LV dysfunction, and there was no clot that was      noted on that repeat echo.  Following these findings, the patient      was started on a low-dose ACE inhibitor.  Dr. Elease Hashimoto saw the      patient, and cardiac cath was done on Jun 22, 2008.  The patient      was started on Coreg as well.  Dr. Elease Hashimoto also felt that his      chronic use of Afrin nasal spray for 40  years, which is a      vasoconstrictor could have caused pulmonary hypotension, and he      instructed the patient to stop using the Afrin.  Dr. Elease Hashimoto      followed up with the patient and did a cardiac cath on June 26, 2008, which showed minimal cardiac irregularities with 50% stenosis      in the posterior descending artery.   Ejection fraction was 30 to      35%.  He recommended continuing aggressive  medical management and      again, as already mentioned, to avoid Afrin.  Patient was to follow      up with him in the office in 1 to 2 weeks.  3. Hypothyroidism:  The patient had a TSH done, and it was elevated at      5.218.  He was on thyroid replacement, thyroid extract, and the      dose was increased to 120 mg p.o. daily.  He was to follow up with      his PCP/endocrinologist.  4. Probable micro adenoma:  It was noted that the patient has      Addison's as well as hypogonadism and hypothyroidism.  An MRI was      done to evaluate for pituitary dysfunction, and the results are      stated above.  I discussed findings with the patient's      endocrinologist, Dr. Talmage Nap, and she stated that his last MRI was in      2004 and it did not show any microadenoma but that the patient      needed to follow up with her outpatient for further evaluation and      management.  This was discussed with the patient.  5. Addison's disease:  He was on chronic hydrocortisone outpatient and      was placed on stress-dose steroids during his hospital stay.  He      remained stable during this hospital stay and was discharged on      tapering doses of steroids and to continue at his maintenance dose      as tapering.  The patient is a 70 year old white male with the      above-listed medical problems who presented with a history of      worsening shortness of breath for a few days.  He reported that he      had been having a lot of congestion and initially was able to get      some relief by blowing his nose but got to a point where he could      not, and that made him feel more and more short of breath.  He      initially schedule an appointment with the ENT because he was      worried there was something stuck in his nose.  He saw the ENT      doctor, who thought that the patient might have a pneumonia and      sent him to the  ED for further evaluation and management.  A chest      x-ray showed a possible perihilar infiltrate.  The patient was      noted to be hypoxic with ambulation.  He denied chest pain,      pleuritic pain, and no chest discomfort.  He complained of      constipation, and it was noted that it was  a chronic problem.  He      was admitted for further evaluation and management.  It was noted      that the patient was on longstanding pain medications and had the      problems with constipation on a chronic basis.   DISCHARGE MEDICATIONS:  1. Carvedilol of 25 mg one p.o. b.i.d.  2. Lisinopril 5 mg p.o. daily.  3. Hydrocortisone taper, as directed, down to the maintenance dose of      20 mg daily as previously.  4. Fluticasone nasal spray 50 mcg 1 nasal spray per nostril daily      p.r.n.  5. The patient to continue testosterone 0.5 mL once weekly as      previously.  6. OxyContin 40 mg t.i.d. as previously.  7. Promethazine 25 mg p.r.n. as previously.  8. Clonazepam t.i.d. p.r.n.  9. Thyroid extract changed to 120 mg daily.  10.Reglan 10 mg q.a.c. and q.h.s. as previously.  11.Misoprostol 100 mg b.i.d.  12.Protonix 40 mg daily.  13.Finasteride 5 mg p.o. daily.  14.Aspirin 81 mg p.o. daily.  15.Lasix 40 mg p.o. daily.  16.KCl 10 mEq p.o. daily.  17.Mucinex 600 mg 1 p.o. b.i.d. x5 days.  18.Triamterene/HCTZ.  Discontinued.   FOLLOW-UP CARE:  1. Dr. Elease Hashimoto in 1-2 weeks.  2. Dr. Tenny Craw in 1-2 weeks.  3. Dr. Talmage Nap in 1 week.   DISCHARGE CONDITION:  Improve and stable.      Kela Millin, M.D.  Electronically Signed     ACV/MEDQ  D:  08/14/2008  T:  08/14/2008  Job:  [id number]   cc:   Vesta Mixer, M.D.  Dorisann Frames, M.D.  Dr. Tenny Craw

## 2010-06-12 NOTE — Procedures (Signed)
[facility]  Patient:    [name], [name]                    MRN: [id number] Proc. Date: 11/27/99 Adm. Date:  [id number] Attending:  Estella Husk CC:         Bing Neighbors. Tenny Craw, M.D.   Procedure Report  PROCEDURE:  Esophagogastroduodenoscopy  INDICATION: Abdominal pain and chronic reflux.  HISTORY OF PRESENT ILLNESS:  This is a 70 year old with 304 month history of abdominal bloating and pain.  This in the context or worsening constipation. He has just completed colonoscopy.  Please see that dictation.  He also has chronic reflux symptoms.  She is now for upper endoscopy.  INFORMED CONSENT:  The nature of the procedure as well as the risks, benefits and alternatives were reviewed.  He understood and agreed to proceed.  PHYSICAL EXAMINATION:  Completed prior to colonoscopy.  See that dictations.  DESCRIPTION OF PROCEDURE:  After completing colonoscopy, the patient remained in the left lateral decubitus position.  Additional sedation was provided with Versed 1 mg and Demerol 10 mg IV.  The Olympus endoscope was  passed orally under direct vision into the esophagus.  The cervical esophagus revealed an incidental inlet patch.  The proximal and mid esophagus was otherwise normal. The distal esophagus was - remarkable for fibrous stricture of peptic origin. There was associated mucosal inflammation.  No Barretts esophagus.  The endoscope passed beyond this without resistance.  The stomach revealed a sliding hiatal hernia but was otherwise normal. The duodenal bulb and postbulbar duodenum to the third portion was normal.  The major ampulla was normal.  IMPRESSION:  Gastroesophageal reflux disease with endoscopic evidence of esophagitis and stricture.  RECOMMENDATIONS: 1. Reflux precautions. 2. Prevacid 30 mg p.o. q.d. 3. Office visit in three four weeks. DD:  11/27/99 TD:  11/28/99 Job: [id number]
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
## 2010-06-12 NOTE — Procedures (Signed)
[facility]  Patient:    [name], [name]                    MRN: [id number] Proc. Date: 11/27/99 Adm. Date:  [id number] Attending:  Estella Husk CC:         C. Miguel Aschoff, M.D.   Procedure Report  PROCEDURE:  Colonoscopy.  INDICATIONS:  Constipation and abdominal pain.  HISTORY:  This is a 70 year old white male who has had three to four-month history of abdominal pain, bloating and constipation.  He has undergone several evaluations.  He was seen by me two days ago in the emergency room. Laboratories were unremarkable.  X-rays, including CT scan, were also unremarkable.  He has not had prior colonoscopy for evaluation of these symptoms or for the purposes of colorectal neoplasia screen.  He is now for that exam.  The nature of the procedure, as well as the risks, benefits and alternatives have been reviewed.  He understood and agreed to proceed.  PHYSICAL EXAMINATION:  GENERAL:  A well-appearing man in no acute distress.  He is alert and oriented.  VITAL SIGNS:  Stable.  LUNGS:  Clear.  HEART:  Regular.  ABDOMEN:  Soft.  DESCRIPTION OF PROCEDURE:  After informed consent was obtained, the patient was sedated with 90 mg of Demerol and 9 mg of Versed IV.  Digital rectal examination was performed and found to be unremarkable.  The Olympus colonoscope was passed under direct vision per rectum through the entire length of the colon to the cecal tip.  Preparation was excellent, except for dense liquid stool pooled in the cecal tip.  This was irrigated and suctioned.  Small mucosal abnormalities could not be excluded.  However, lesions larger than 1 cm were excluded.  Terminal ileum was intubated and appeared normal.  Careful examination of the mucosa from the cecum to rectum revealed no mucosal abnormalities.  A retroflexed view of the rectum demonstrated internal hemorrhoids.  IMPRESSION: 1. Normal colon and terminal ileum. 2.  Internal hemorrhoids. 3. Abdominal symptoms likely secondary to constipation from chronic    narcotic use.  RECOMMENDATIONS: 1. Miralax 17 g b.i.d. in 10 oz of water or juice. 2. Proceed to upper endoscopy. 3. Office visit in three to four weeks. DD:  11/27/99 TD:  11/28/99 Job: [id number]

## 2010-06-12 NOTE — Op Note (Signed)
[name], [name]             ACCOUNT NO.:  [id number]   MEDICAL RECORD NO.:  [id number]          PATIENT TYPE:  AMB   LOCATION:  ENDO                         FACILITY:  [facility]   PHYSICIAN:  [name]., M.D.DATE OF BIRTH:  22-Dec-1940   DATE OF PROCEDURE:  10/02/2004  DATE OF DISCHARGE:                                 OPERATIVE REPORT   PROCEDURE:  Sigmoidoscopy with biopsy.   INDICATIONS:  Persistent diarrhea.  Wife called, said he was having terrible  diarrhea and was dehydrated and brought him over for an unprepped sigmoid.   MEDICATIONS:  None.  The procedure was done unsedated.   ENDOSCOPE:  Pediatric adjustable colonoscope.   DESCRIPTION OF PROCEDURE:  The procedure had been explained to the patient  and consent obtained.  In the left lateral decubitus position, the scope was  inserted.  This was done in the unprepped state.  Immediately upon entering  the rectum, the patient was seen to have severe melanosis coli with diffuse  dark mucosa throughout.  We went up to approximately 30 cm.  At this point,  solid stool was encountered.  Multiple biopsies were taken.  I was only able  to pass 30 cm.  He had diffuse melanosis coli to this limit.  The scope was  withdrawn.  The patient tolerated the procedure well.   ASSESSMENT:  __________ colon with probable functional diarrhea, 564.5.   PLAN:  Will start the patient on Questran and will check pathology results,  see him back in the office in one to two weeks.           ______________________________  Llana Aliment Malon Gates., M.D.     Waldron Session  D:  10/02/2004  T:  10/02/2004  Job:  [id number]   cc:   C. Duane Lope, M.D.  [address]  [place]  [place] [address]  Fax: [phone]   Dorisann Frames, M.D.  [phone] [address] [place] [address]  Fax: [phone]

## 2010-06-15 ENCOUNTER — Telehealth: Payer: Self-pay | Admitting: Cardiovascular Disease

## 2010-06-15 NOTE — Telephone Encounter (Signed)
Calling wanting Dr. Elease Hashimoto to call [facility] to have them come and pick up the daytime/outdoor breathing equipment equipment because her husband is doing so well. Please call back. I have pulled the chart.

## 2010-06-18 ENCOUNTER — Telehealth: Payer: Self-pay | Admitting: *Deleted

## 2010-06-18 NOTE — Telephone Encounter (Signed)
msg left to call back with where he wants script faxed to for oxygen pick up.Alfonso Ramus RN

## 2010-08-26 ENCOUNTER — Telehealth: Payer: Self-pay | Admitting: Cardiovascular Disease

## 2010-08-26 NOTE — Telephone Encounter (Signed)
PER WIFE, PT HAS HAD TROUBLE BREATHING FOR THE PAST 2 NIGHTS AND SHE IS CONCERNED. PLEASE CALL BACK TODAY. CHART IN BOX.

## 2010-08-26 NOTE — Telephone Encounter (Signed)
No answer

## 2010-08-26 NOTE — Telephone Encounter (Signed)
Unable to reach to advise f/u with PCP or pulmonology. Will try tomorrow.

## 2010-08-26 NOTE — Telephone Encounter (Signed)
Wife said HX: CHF/addisons, wears O2 @[facility] , past two nights she had to wake him up due to agonal type breathing and he stops breathing, pt stated he is even dreaming he cant breath. Denies LE edema.  Has congestion/ runny nose and sob at night only. Please advise.

## 2010-08-26 NOTE — Telephone Encounter (Signed)
Several call backs no answer and unable to leave msg/voice mail box full.

## 2010-08-26 NOTE — Telephone Encounter (Signed)
His LV function is now normal.  He should consult his primary medical doctor

## 2010-08-26 NOTE — Telephone Encounter (Signed)
NO ANSWER, WILL CONTINUE TO CALL

## 2010-08-26 NOTE — Telephone Encounter (Signed)
Called no answer

## 2010-08-27 NOTE — Telephone Encounter (Signed)
Attempt call, voice mail box not set up is what recorder keeps saying.

## 2010-08-27 NOTE — Telephone Encounter (Signed)
msg left/ first time i spoke with wife she said they were having trouble with there phone lines. i left  msg on home line this am stating to f/u with pcp or pulmonologist.

## 2010-10-01 ENCOUNTER — Other Ambulatory Visit: Payer: Self-pay | Admitting: *Deleted

## 2010-10-01 ENCOUNTER — Telehealth: Payer: Self-pay | Admitting: Cardiovascular Disease

## 2010-10-01 MED ORDER — CARVEDILOL 25 MG PO TABS: 25.0000 mg | ORAL_TABLET | Freq: Two times a day (BID) | ORAL | Status: DC

## 2010-10-01 NOTE — Telephone Encounter (Signed)
Patient request refill. done Jodette Sharene Krikorian RN  

## 2010-10-01 NOTE — Telephone Encounter (Signed)
Called needing a refill of her husbands carvedilol (COREG) 25 MG tablet with [employer] Sc[employer]. Please call back. I have pulled his chart.

## 2010-10-01 NOTE — Telephone Encounter (Signed)
Refill complete/ wife called

## 2010-10-01 NOTE — Telephone Encounter (Signed)
meds refilled to both pharmacies till mail order gets there.

## 2010-10-16 LAB — BASIC METABOLIC PANEL
BUN: 13
CO2: 22
GFR calc non Af Amer: 58 — ABNORMAL LOW
Glucose, Bld: 126 — ABNORMAL HIGH
Potassium: 4.3
Sodium: 137

## 2010-10-16 LAB — COMPREHENSIVE METABOLIC PANEL
Alkaline Phosphatase: 82
BUN: 9
CO2: 25
Chloride: 106
Creatinine, Ser: 1.31
GFR calc non Af Amer: 55 — ABNORMAL LOW
Potassium: 3.6
Total Bilirubin: 0.7

## 2010-10-16 LAB — URINALYSIS, ROUTINE W REFLEX MICROSCOPIC
Bilirubin Urine: NEGATIVE
Hgb urine dipstick: NEGATIVE
Ketones, ur: NEGATIVE
Nitrite: NEGATIVE
Urobilinogen, UA: 0.2
pH: 5.5

## 2010-10-16 LAB — APTT: aPTT: 29

## 2010-10-16 LAB — CBC
HCT: 44.1
HCT: 48.5
Hemoglobin: 15.1
Hemoglobin: 16.5
MCHC: 34.2
MCV: 94.2
Platelets: 272
RBC: 5.15
RDW: 14.4
WBC: 16.1 — ABNORMAL HIGH

## 2010-10-16 LAB — PROTIME-INR: INR: 1

## 2010-11-06 LAB — ACTH STIMULATION, 3 TIME POINTS: Cortisol, 60 Min: 18.1 (ref >20)

## 2010-11-11 ENCOUNTER — Encounter: Payer: Self-pay | Admitting: Cardiovascular Disease

## 2010-11-16 ENCOUNTER — Ambulatory Visit (INDEPENDENT_AMBULATORY_CARE_PROVIDER_SITE_OTHER): Payer: BC Managed Care – PPO | Admitting: Cardiovascular Disease

## 2010-11-16 ENCOUNTER — Encounter: Payer: Self-pay | Admitting: Cardiovascular Disease

## 2010-11-16 VITALS — BP 133/85 | HR 82 | Ht 69.0 in | Wt 197.4 lb

## 2010-11-16 DIAGNOSIS — E785 Hyperlipidemia, unspecified: Secondary | ICD-10-CM

## 2010-11-16 DIAGNOSIS — I509 Heart failure, unspecified: Secondary | ICD-10-CM

## 2010-11-16 NOTE — Progress Notes (Signed)
[name] Date of Birth  1940/04/08 [facility] [address]    [address] [place], [place]  [address] [phone]  Fax  [phone]  History of Present Illness:  70 year old gentleman with a history of congestive heart failure. His ejection fraction is 30-35%. He has a history of pulmonary hypertension thought to be at least partially due to chronic aspirin use.  He presents today with no cardiac complaints. He's been having some numbness and tingling in his left arm. He also describes Raynaud's phenomenon. He states that his hands have been turning white ever since the temperature dropped.  He complains of having some neck pain. I suspect that he is having some cervical spine issues.   Current Outpatient Prescriptions on File Prior to Visit  Medication Sig Dispense Refill  . aspirin 81 MG tablet Take 81 mg by mouth daily.        . carvedilol (COREG) 25 MG tablet Take 1 tablet (25 mg total) by mouth 2 (two) times daily with a meal.  60 tablet  1  . Cholecalciferol (VITAMIN D3 PO) Take by mouth.        . cyclobenzaprine (FLEXERIL) 10 MG tablet       . furosemide (LASIX) 40 MG tablet Take 60 mg by mouth daily.       . hydrocortisone (CORTEF) 20 MG tablet Take 20 mg by mouth daily.        Marland Kitchen lisinopril (PRINIVIL,ZESTRIL) 20 MG tablet Take 20 mg by mouth daily.        Marland Kitchen omeprazole (PRILOSEC) 20 MG capsule Take 20 mg by mouth daily.        . promethazine (PHENERGAN) 25 MG tablet Take 25 mg by mouth every 6 (six) hours as needed.        . thyroid (ARMOUR) 60 MG tablet Take 60 mg by mouth daily.        . traMADol (ULTRAM) 50 MG tablet Take 50 mg by mouth every 6 (six) hours as needed.        . traZODone (DESYREL) 100 MG tablet Take 100 mg by mouth at bedtime.         No Known Allergies  Past Medical History  Diagnosis Date  . CHF (congestive heart failure)   . Thyroid disease     addisons  . Pulmonary HTN   . Hypertension   . Addison's disease   . Hypogonadism  male   . Renal insufficiency   . History of aortic insufficiency   . SOB (shortness of breath)     Past Surgical History  Procedure Date  . Cholecystectomy   . Cardiac catheterization 06/26/2008    EF 30-35%  . US echocardiography 04/09/2010    EF 60-65%     History  Smoking status  . Never Smoker   Smokeless tobacco  . Not on file    History  Alcohol Use No    No family history on file.  Reviw of Systems:  Reviewed in the HPI.  All other systems are negative.  Physical Exam: BP 133/85  Pulse 82  Ht 5\' 9"  (1.753 m)  Wt 197 lb 6.4 oz (89.54 kg)  BMI 29.15 kg/m2 The patient is alert and oriented x 3.  The mood and affect are normal.   Skin: warm and dry.  Color is normal.    HEENT:   Normocephalic, atraumatic. His carotids are 2+. He is no JVD.  His voice is raspy.  Lungs: Clear to auscultation  Heart: Regular rate S1-S2.    Abdomen: Has normal bowel sounds. His abdomen is nontender. There is no hepatosplenomegaly.  Extremities:  His distal pulses are excellent. His fingers were cool. There is no evidence of Raynauds  phenomenon today.  Neuro:  Nonfocal. His gait is normal.     ECG: Normal sinus rhythm. His EKG is normal.   Assessment / Plan:

## 2010-11-16 NOTE — Assessment & Plan Note (Signed)
His ejection fraction has improved from 35% to 65% on medical therapy. He is no longer needing home oxygen during the day. He still uses his home oxygen at night.  Is not having nearly the episodes of dizziness. He's lost a lot of weight and all these have improved his breathing and overall feeling of well being.  We'll continue with his current medications.

## 2010-11-16 NOTE — Patient Instructions (Signed)
Your physician wants you to follow-up in: 6 months You will receive a reminder letter in the mail two months in advance. If you don't receive a letter, please call our office to schedule the follow-up appointment.  Your physician recommends that you return for a FASTING lipid profile: 6 months   Your physician recommends that you continue on your current medications as directed. Please refer to the Current Medication list given to you today.  

## 2010-12-03 ENCOUNTER — Telehealth: Payer: Self-pay | Admitting: Cardiovascular Disease

## 2010-12-03 MED ORDER — LISINOPRIL 20 MG PO TABS: 20.0000 mg | ORAL_TABLET | Freq: Every day | ORAL | Status: DC

## 2010-12-03 NOTE — Telephone Encounter (Signed)
Refill completed.

## 2010-12-03 NOTE — Telephone Encounter (Signed)
Lisinopril 20mg , [facility] 90 days supply

## 2011-05-10 ENCOUNTER — Other Ambulatory Visit: Payer: Self-pay

## 2011-05-10 MED ORDER — CARVEDILOL 25 MG PO TABS: 25.0000 mg | ORAL_TABLET | Freq: Two times a day (BID) | ORAL | Status: DC

## 2011-05-10 NOTE — Telephone Encounter (Signed)
Refilled medication

## 2011-05-10 NOTE — Telephone Encounter (Signed)
[facility]

## 2011-05-14 ENCOUNTER — Telehealth: Payer: Self-pay | Admitting: Cardiovascular Disease

## 2011-05-14 NOTE — Telephone Encounter (Signed)
[name] wife calling back stating husband dizzy and SOB--asked if she wanted to see dr Elease Hashimoto or pulmonologist--she stated she wanted to see dr Elease Hashimoto as" he really doesn't have much trouble with his lungs"--advised to go to nearest ED if becomes worse--in meantime we will get an appoint with dr Trudie Buckler agrees--i had mellisa call ms Kullman and give her an appoint for mr Lundblad--nt

## 2011-05-14 NOTE — Telephone Encounter (Signed)
4:45PM--Attempted to phone pt's wife to find out what's going on with pt--no answer--LM on answering machine for them to call us back--nt

## 2011-05-14 NOTE — Telephone Encounter (Signed)
[name] wife  °

## 2011-05-14 NOTE — Telephone Encounter (Signed)
New Problem:    Patient's wife called in because her husband has not felt like himself.  Withing the last two weeks the patient has felt dizzy when bending down and standing up, even with his oxygen he feels like he can't breath, patient is losing weight very rapidly.  Please call back.

## 2011-05-20 ENCOUNTER — Ambulatory Visit: Payer: BC Managed Care – PPO | Admitting: Nurse Practitioner

## 2011-06-14 ENCOUNTER — Other Ambulatory Visit: Payer: Self-pay | Admitting: Cardiovascular Disease

## 2011-06-14 NOTE — Telephone Encounter (Signed)
Out of pills 2 weeks worth to last until get refill [facility]

## 2011-06-15 MED ORDER — LISINOPRIL 20 MG PO TABS: 20.0000 mg | ORAL_TABLET | Freq: Every day | ORAL | Status: DC

## 2011-06-15 MED ORDER — CARVEDILOL 25 MG PO TABS: 25.0000 mg | ORAL_TABLET | Freq: Two times a day (BID) | ORAL | Status: DC

## 2011-06-15 NOTE — Telephone Encounter (Signed)
SPOKE WITH PATIENTS WIFE, AND SCHEDULED AN APPT. WITH DR.[name]. SENT A 30 DAY SUPPLY UNTIL OFFICE VISIT

## 2011-06-16 ENCOUNTER — Other Ambulatory Visit: Payer: Self-pay | Admitting: Cardiovascular Disease

## 2011-06-16 MED ORDER — LISINOPRIL 20 MG PO TABS: 20.0000 mg | ORAL_TABLET | Freq: Every day | ORAL | Status: DC

## 2011-06-16 MED ORDER — CARVEDILOL 25 MG PO TABS: 25.0000 mg | ORAL_TABLET | Freq: Two times a day (BID) | ORAL | Status: DC

## 2011-06-24 ENCOUNTER — Ambulatory Visit (INDEPENDENT_AMBULATORY_CARE_PROVIDER_SITE_OTHER): Payer: BC Managed Care – PPO | Admitting: Cardiovascular Disease

## 2011-06-24 ENCOUNTER — Encounter: Payer: Self-pay | Admitting: Cardiovascular Disease

## 2011-06-24 VITALS — BP 120/78 | HR 80 | Ht 69.0 in | Wt 175.4 lb

## 2011-06-24 DIAGNOSIS — I509 Heart failure, unspecified: Secondary | ICD-10-CM

## 2011-06-24 MED ORDER — LISINOPRIL 10 MG PO TABS: 10.0000 mg | ORAL_TABLET | Freq: Every day | ORAL | Status: DC

## 2011-06-24 NOTE — Assessment & Plan Note (Addendum)
More he feels fairly well. He's had some adjustments to his medications. He initially decrease his testosterone dose and this ultimately required an increase in his son Cortef dose. His lisinopril dose has been decreased a little bit because of hypertension.  It has been 3 years since her last echocardiogram. His furosemide has also been stopped.  We'll get another echocardiogram For further evaluation of his left ventricular function  and to assess his pulmonary hypertension.

## 2011-06-24 NOTE — Patient Instructions (Addendum)
Your physician has requested that you have an echocardiogram. Echocardiography is a painless test that uses sound waves to create images of your heart. It provides your doctor with information about the size and shape of your heart and how well your heart's chambers and valves are working. This procedure takes approximately one hour. There are no restrictions for this procedure.    Your physician recommends that you schedule a follow-up appointment in: 6 MONTHS  Your physician recommends that you return for a FASTING lipid profile:6 MONTHS BMET, LIPID, HEPATIC   Your physician has recommended you make the following change in your medication:   LASIX WAS REMOVED FROM YOUR LIST/ STOP MED  DECREASE LISINOPRIL TO 10 MG DAILY.

## 2011-06-24 NOTE — Progress Notes (Signed)
Selinda Orion Date of Birth  05-Nov-1940       [facility]    [place] [address], [address]  [address] [place], [place]  [address]   [place], [place]  [address] [phone]     [phone]   Fax  [phone]    Fax [phone]  Problem List: 1. 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 Congestive heart failure-EF of 30-35% by echo in 2010 3. Hypertension 4. Addison's disease-on chronic steroid therapy 5. Hypothyroidism 6. Mild adrenal insufficiency  History of Present Illness:  Raymone has doing well from a cardiac standpoint.  He made some adjustments to his Testosterone dose and ultimately had problems with his Addisons disease. His breathing continues to slowly improve.  Current Outpatient Prescriptions on File Prior to Visit  Medication Sig Dispense Refill  . aspirin 81 MG tablet Take 81 mg by mouth daily.        . carvedilol (COREG) 25 MG tablet Take 1 tablet (25 mg total) by mouth 2 (two) times daily with a meal.  180 tablet  1  . Cholecalciferol (VITAMIN D3 PO) Take by mouth.        . cyclobenzaprine (FLEXERIL) 10 MG tablet Take 10 mg by mouth 2 (two) times daily as needed.       . hydrocortisone (CORTEF) 20 MG tablet Take 20 mg by mouth daily.        Marland Kitchen omeprazole (PRILOSEC) 20 MG capsule Take 20 mg by mouth daily.        . promethazine (PHENERGAN) 25 MG tablet Take 25 mg by mouth every 6 (six) hours as needed.        . thyroid (ARMOUR) 60 MG tablet Take 60 mg by mouth daily.        . traMADol (ULTRAM) 50 MG tablet Take 50 mg by mouth every 6 (six) hours as needed.        . traZODone (DESYREL) 100 MG tablet Take 50 mg by mouth at bedtime.       Marland Kitchen DISCONTD: lisinopril (PRINIVIL,ZESTRIL) 20 MG tablet Take 1 tablet (20 mg total) by mouth daily.  90 tablet  1  . furosemide (LASIX) 40 MG tablet Take 60 mg by mouth daily.         No Known Allergies  Past Medical History  Diagnosis Date  . CHF  (congestive heart failure)     His initial ejection fraction was between 30 and 35%. His most recent echocardiogram performed in March of 2012 reveals normal left ventricular systolic function with an EF of 60-65%.  . Thyroid disease     addisons  . Pulmonary HTN   . Hypertension   . Addison's disease   . Hypogonadism male   . Renal insufficiency   . History of aortic insufficiency   . SOB (shortness of breath)     Past Surgical History  Procedure Date  . Cholecystectomy   . Cardiac catheterization 06/26/2008    EF 30-35%  . US echocardiography 04/09/2010    EF 60-65%    History  Smoking status  . Never Smoker   Smokeless tobacco  . Not on file    History  Alcohol Use No    No family history on file.  Reviw of Systems:  Reviewed in the HPI.  All other systems are negative.  Physical Exam: Blood pressure 120/78, pulse 80, height 5\' 9"  (1.753 m), weight 175 lb 6.4 oz (79.561 kg).  General: Well developed, well nourished, in no acute distress.  Head: Normocephalic, atraumatic, sclera non-icteric, mucus membranes are moist,   Neck: Supple. Carotids are 2 + without bruits. No JVD  Lungs: Clear bilaterally to auscultation.  Heart: regular rate.  normal  S1 S2. No murmurs, gallops or rubs.  Abdomen: Soft, non-tender, non-distended with normal bowel sounds. No hepatomegaly. No rebound/guarding. No masses.  Msk:  Strength and tone are normal  Extremities: No clubbing or cyanosis. No edema.  Distal pedal pulses are 2+ and equal bilaterally.  Neuro: Alert and oriented X 3. Moves all extremities spontaneously.  Psych:  Responds to questions appropriately with a normal affect.  ECG:  Assessment / Plan:

## 2011-07-05 ENCOUNTER — Other Ambulatory Visit (HOSPITAL_COMMUNITY): Payer: BC Managed Care – PPO

## 2011-07-12 ENCOUNTER — Ambulatory Visit (HOSPITAL_COMMUNITY): Payer: BC Managed Care – PPO | Attending: Cardiology | Admitting: Radiology

## 2011-07-12 DIAGNOSIS — R0609 Other forms of dyspnea: Secondary | ICD-10-CM | POA: Insufficient documentation

## 2011-07-12 DIAGNOSIS — I359 Nonrheumatic aortic valve disorder, unspecified: Secondary | ICD-10-CM | POA: Insufficient documentation

## 2011-07-12 DIAGNOSIS — I270 Primary pulmonary hypertension: Secondary | ICD-10-CM | POA: Insufficient documentation

## 2011-07-12 DIAGNOSIS — R0989 Other specified symptoms and signs involving the circulatory and respiratory systems: Secondary | ICD-10-CM | POA: Insufficient documentation

## 2011-07-12 DIAGNOSIS — I10 Essential (primary) hypertension: Secondary | ICD-10-CM | POA: Insufficient documentation

## 2011-07-12 DIAGNOSIS — I509 Heart failure, unspecified: Secondary | ICD-10-CM | POA: Insufficient documentation

## 2011-07-12 DIAGNOSIS — E2749 Other adrenocortical insufficiency: Secondary | ICD-10-CM | POA: Insufficient documentation

## 2011-07-12 NOTE — Progress Notes (Signed)
Echocardiogram performed.  

## 2012-01-13 ENCOUNTER — Other Ambulatory Visit: Payer: Self-pay | Admitting: *Deleted

## 2012-01-13 MED ORDER — CARVEDILOL 25 MG PO TABS: 25.0000 mg | ORAL_TABLET | Freq: Two times a day (BID) | ORAL | Status: DC

## 2012-03-14 ENCOUNTER — Other Ambulatory Visit: Payer: Self-pay | Admitting: Gastroenterology

## 2012-03-14 DIAGNOSIS — R109 Unspecified abdominal pain: Secondary | ICD-10-CM

## 2012-03-20 ENCOUNTER — Ambulatory Visit
Admission: RE | Admit: 2012-03-20 | Discharge: 2012-03-20 | Disposition: A | Discharge status: Home / Self Care | Payer: BC Managed Care – PPO | Source: Ambulatory Visit | Attending: Gastroenterology | Admitting: Gastroenterology

## 2012-04-20 ENCOUNTER — Telehealth: Payer: Self-pay | Admitting: Cardiovascular Disease

## 2012-04-20 NOTE — Telephone Encounter (Signed)
Called x 2, unable to leave a msg.

## 2012-04-20 NOTE — Telephone Encounter (Signed)
New Prob   Pt experiencing SOB. Is currently on extra oxygen, however, still feels he is not getting enough throughout the day. Concerned and would like to speak to nurse.

## 2012-04-20 NOTE — Telephone Encounter (Signed)
Wife called back, states her husband is out on errands and will be back soon to confirm some of the questions we reviewed. Pt does not have LE edema, wt is stable at approx 198 lb, pt follows low salt diet due to addison's and everything already tastes salty. She states that he only has SOB with ambulation not at rest. She will call back to inform if he is having any CP. Pt was given a regular f/u app but nothing soon. I will wait to see if he has any other sx, pt does have lung problems too and may need to f/u with pulmonology.  She will call back.

## 2012-04-21 NOTE — Telephone Encounter (Signed)
I called wife and she said he does not have cp, no leg edema, only other complaint is dizziness. Told her to make an app today with pcp to evaluate dizziness. Told her to call with any further questions or concerns, She agreed to plan,

## 2012-05-15 ENCOUNTER — Telehealth: Payer: Self-pay | Admitting: Cardiovascular Disease

## 2012-05-15 NOTE — Telephone Encounter (Signed)
UNABLE TO LEAVE MSG, WILL TRY LATER

## 2012-05-15 NOTE — Telephone Encounter (Signed)
Unable to leave a msg, will try later

## 2012-05-15 NOTE — Telephone Encounter (Signed)
Follow Up     Calling in following up on call from earlier. Please call.

## 2012-05-15 NOTE — Telephone Encounter (Signed)
Wife states pt has had right ankle edema  x 1 day and SOB x 2 weeks, pt is able to lay down at HS and denies belly tightening. Denies weight gain. No other symptoms. Told her to call PCP to evaluate. Told pt I would forward a note to Dr Elease Hashimoto to see if any further recommendations, pt agreed to plan.

## 2012-05-15 NOTE — Telephone Encounter (Signed)
New Prob    Has some questions regarding some swelling in pts ankles. Would like to speak to nurse.

## 2012-05-30 NOTE — Telephone Encounter (Signed)
No answer/ no vm. Wanted to get update on SS

## 2012-06-26 ENCOUNTER — Other Ambulatory Visit: Payer: Self-pay | Admitting: *Deleted

## 2012-06-26 MED ORDER — CARVEDILOL 25 MG PO TABS: 25.0000 mg | ORAL_TABLET | Freq: Two times a day (BID) | ORAL | Status: DC

## 2012-06-26 NOTE — Telephone Encounter (Signed)
Fax Received. Refill Completed. [facility] (R.M.A)  No refills until appointment  

## 2012-07-03 ENCOUNTER — Ambulatory Visit: Payer: BC Managed Care – PPO | Admitting: Cardiovascular Disease

## 2012-07-12 ENCOUNTER — Other Ambulatory Visit (HOSPITAL_COMMUNITY): Payer: Self-pay | Admitting: Urology

## 2012-07-12 DIAGNOSIS — N289 Disorder of kidney and ureter, unspecified: Secondary | ICD-10-CM

## 2012-07-19 ENCOUNTER — Encounter: Payer: Self-pay | Admitting: Cardiovascular Disease

## 2012-08-07 ENCOUNTER — Ambulatory Visit (HOSPITAL_COMMUNITY)
Admission: RE | Admit: 2012-08-07 | Discharge: 2012-08-07 | Disposition: A | Discharge status: Home / Self Care | Payer: BC Managed Care – PPO | Source: Ambulatory Visit | Attending: Urology | Admitting: Urology

## 2012-08-07 DIAGNOSIS — Z9089 Acquired absence of other organs: Secondary | ICD-10-CM | POA: Insufficient documentation

## 2012-08-07 DIAGNOSIS — N289 Disorder of kidney and ureter, unspecified: Secondary | ICD-10-CM

## 2012-08-07 DIAGNOSIS — N281 Cyst of kidney, acquired: Secondary | ICD-10-CM | POA: Insufficient documentation

## 2012-08-07 DIAGNOSIS — R1011 Right upper quadrant pain: Secondary | ICD-10-CM | POA: Insufficient documentation

## 2012-08-07 MED ORDER — GADOBENATE DIMEGLUMINE 529 MG/ML IV SOLN
20.0000 mL | Freq: Once | INTRAVENOUS | Status: AC | PRN
  Administered 2012-08-07: 18 mL via INTRAVENOUS

## 2012-08-08 ENCOUNTER — Ambulatory Visit (INDEPENDENT_AMBULATORY_CARE_PROVIDER_SITE_OTHER)
Admission: RE | Admit: 2012-08-08 | Discharge: 2012-08-08 | Disposition: A | Discharge status: Home / Self Care | Payer: BC Managed Care – PPO | Source: Ambulatory Visit | Attending: Physician Assistant | Admitting: Physician Assistant

## 2012-08-08 ENCOUNTER — Ambulatory Visit (INDEPENDENT_AMBULATORY_CARE_PROVIDER_SITE_OTHER): Payer: BC Managed Care – PPO | Admitting: Physician Assistant

## 2012-08-08 ENCOUNTER — Encounter: Payer: Self-pay | Admitting: Physician Assistant

## 2012-08-08 VITALS — BP 130/80 | HR 74 | Ht 70.0 in | Wt 201.0 lb

## 2012-08-08 DIAGNOSIS — I10 Essential (primary) hypertension: Secondary | ICD-10-CM

## 2012-08-08 DIAGNOSIS — I2510 Atherosclerotic heart disease of native coronary artery without angina pectoris: Secondary | ICD-10-CM

## 2012-08-08 DIAGNOSIS — I5032 Chronic diastolic (congestive) heart failure: Secondary | ICD-10-CM

## 2012-08-08 DIAGNOSIS — R0602 Shortness of breath: Secondary | ICD-10-CM

## 2012-08-08 LAB — BASIC METABOLIC PANEL
CO2: 26 mEq/L (ref 19–32)
Chloride: 104 mEq/L (ref 96–112)
Glucose, Bld: 85 mg/dL (ref 70–99)
Potassium: 3.9 mEq/L (ref 3.5–5.1)
Sodium: 140 mEq/L (ref 135–145)

## 2012-08-08 LAB — CBC WITH DIFFERENTIAL/PLATELET
Basophils Absolute: 0.1 10*3/uL (ref 0.0–0.1)
Basophils Relative: 0.4 % (ref 0.0–3.0)
Eosinophils Relative: 0.9 % (ref 0.0–5.0)
Hemoglobin: 14.6 g/dL (ref 13.0–17.0)
Lymphs Abs: 2.1 10*3/uL (ref 0.7–4.0)
MCHC: 33.3 g/dL (ref 30.0–36.0)
MCV: 94.7 fl (ref 78.0–100.0)
RBC: 4.64 Mil/uL (ref 4.22–5.81)

## 2012-08-08 NOTE — Progress Notes (Signed)
[address], [address] [place], [place]  [address] Phone: [phone] Fax:  [phone]  Date:  08/08/2012   ID:  AIMEE TIMMONS, DOB Apr 09, 1940, MRN [id number]  PCP:  Daisy Floro, MD  Cardiologist:  Dr. Delane Ginger     History of Present Illness: [name] is a 72 y.o. male who returns for evaluation of dyspnea.  He has a hx of NICM with systolic CHF dx in 05/2008, secondary pulmonary HTN, non-obstructive CAD, Addison's Disease, hypothyroidism, hypogonadism, HTN, CKD.  Initial EF was 30-35%.  LHC 06/2008:  EF 30-35%, pPDA 50%, markedly elevated LVEDP with secondary pulmonary HTN.  EF has improved and last echo 06/2011:  Mild LVH, EF 55-60%, Gr 1 DD, mild AI.    He notes a hx of DOE over the last 1+ year.  He feels like it is getting worse.  Denies CP.  No syncope.  No palpitations.  No orthopnea, PND, edema.  He does note an episode of what sounds like PND many months ago but none recent.  No cough.  No fever.  He did lose a lot of weight, but has gained it all back.  Testosterone levels were low recently.  He is on replacement.  He states all his hormone levels are "ok" and follows regularly with endocrine.   Labs (6/10):  Hgb 15.7,  Labs (4/12):  K 4.7, Cr 1.9, ALT 20, proBNP 40.4, LDL 123  Labs (7/14):  Cr 1.32  Wt Readings from Last 3 Encounters:  08/08/12 201 lb (91.173 kg)  06/24/11 175 lb 6.4 oz (79.561 kg)  11/16/10 197 lb 6.4 oz (89.54 kg)     Past Medical History  Diagnosis Date  . CHF (congestive heart failure)     His initial ejection fraction was between 30 and 35%. His most recent echocardiogram performed in March of 2012 reveals normal left ventricular systolic function with an EF of 60-65%.  . Thyroid disease     addisons  . Pulmonary HTN   . Hypertension   . Addison's disease   . Hypogonadism male   . Renal insufficiency   . History of aortic insufficiency   . SOB (shortness of breath)     Current Outpatient Prescriptions    Medication Sig Dispense Refill  . aspirin 81 MG tablet Take 81 mg by mouth daily.        . carvedilol (COREG) 25 MG tablet Take 1 tablet (25 mg total) by mouth 2 (two) times daily with a meal.  180 tablet  0  . Cholecalciferol (VITAMIN D3 PO) Take by mouth.        . fluoxymesterone (HALOTESTIN) 10 MG tablet Take by mouth daily.      . hydrocortisone (CORTEF) 20 MG tablet Take 15 mg by mouth daily.       Marland Kitchen lisinopril (PRINIVIL,ZESTRIL) 10 MG tablet Take 1 tablet (10 mg total) by mouth daily.  90 tablet  3  . omeprazole (PRILOSEC) 20 MG capsule Take 20 mg by mouth daily.        . promethazine (PHENERGAN) 25 MG tablet Take 25 mg by mouth every 6 (six) hours as needed.        . thyroid (ARMOUR) 60 MG tablet Take 60 mg by mouth daily.        . traMADol (ULTRAM) 50 MG tablet Take 50 mg by mouth every 6 (six) hours as needed.        . traZODone (DESYREL) 100 MG tablet Take 50  mg by mouth at bedtime.        No current facility-administered medications for this visit.    Allergies:   No Known Allergies  Social History:  The patient  reports that he has never smoked. He does not have any smokeless tobacco history on file. He reports that he does not drink alcohol or use illicit drugs.   ROS:  Please see the history of present illness.      All other systems reviewed and negative.   PHYSICAL EXAM: VS:  BP 130/80  Pulse 74  Ht 5\' 10"  (1.778 m)  Wt 201 lb (91.173 kg)  BMI 28.84 kg/m2  SpO2 96% Well nourished, well developed, in no acute distress HEENT: normal Neck: no JVD at 90 Vascular: No carotid bruits Cardiac:  normal S1, S2; RRR; no murmur Lungs:  clear to auscultation bilaterally, no wheezing, rhonchi or rales Abd: soft, nontender, no hepatomegaly Ext: no edema Skin: warm and dry Neuro:  CNs 2-12 intact, no focal abnormalities noted  EKG:  NSR, HR 74, normal axis, no acute changes     ASSESSMENT AND PLAN:  1. Dyspnea:  Etiology not clear. He did have a 50% PDA lesion at the  time of this cardiac catheterization. Question if this is an anginal equivalent. I will arrange an ETT-Myoview.  This will also allow reassessment of his LV function. This was normal by prior assessment in 06/2011. He does not have an AI murmur on exam. At this point, it does not appear that his AI has worsened. I will also check a basic metabolic panel, BNP, CBC. If his BNP is significantly elevated, consider adding Lasix and obtaining a formal echocardiogram. Also check a chest x-ray.  He does have a history of secondary pulmonary hypertension.  He tells me he wears O2 Q night.  He saw pulmonology once.  If cardiac workup is unremarkable, consider referral back to pulmonology.  2. CAD:  Continue ASA.  He is not on statin for unclear reasons.  Proceed with myoview as noted.  Consider statin Rx in the future. 3. Hypertension:  Controlled.  Continue current therapy.  4. Diastolic CHF:  EF has normalized.  He does not appear volume overloaded.  Check BNP as noted. 5. Disposition:  F/u with Dr. Delane Ginger in 3-4 weeks.  Signed, Tereso Newcomer, PA-C  08/08/2012 9:11 AM

## 2012-08-08 NOTE — Patient Instructions (Addendum)
Your physician recommends that you return for lab work today--BMET/BNP/CBCd  A chest x-ray takes a picture of the organs and structures inside the chest, including the heart, lungs, and blood vessels. This test can show several things, including, whether the heart is enlarges; whether fluid is building up in the lungs; and whether pacemaker / defibrillator leads are still in place. [place] [facility] [address] basement today.   Your physician has requested that you have en exercise stress myoview. For further information please visit [email or web address] Please follow instruction sheet, as given.  Your physician recommends that you schedule a follow-up appointment in: 3-4 weeks with Dr Elease Hashimoto.

## 2012-08-09 ENCOUNTER — Telehealth: Payer: Self-pay | Admitting: *Deleted

## 2012-08-09 NOTE — Telephone Encounter (Signed)
Message copied by Burnell Blanks on Wed Aug 09, 2012 12:11 PM ------      Message from: [place], [place] T      Created: Tue Aug 08, 2012  5:46 PM       chest X-ray without acute findings      Continue with current treatment plan.      Tereso Newcomer, PA-C        08/08/2012 5:46 PM ------

## 2012-08-09 NOTE — Telephone Encounter (Signed)
Message copied by Burnell Blanks on Wed Aug 09, 2012 12:11 PM ------      Message from: [place], [place] T      Created: Tue Aug 08, 2012  5:46 PM       Potassium and kidney function ok      BNP normal      Hgb normal      Continue with current treatment plan.      Tereso Newcomer, PA-C        08/08/2012 5:46 PM ------

## 2012-08-09 NOTE — Telephone Encounter (Signed)
Left message with wife, call back if any questions

## 2012-08-14 ENCOUNTER — Ambulatory Visit (HOSPITAL_COMMUNITY): Payer: BC Managed Care – PPO | Attending: Physician Assistant | Admitting: Radiology

## 2012-08-14 VITALS — BP 156/94 | HR 76 | Ht 70.0 in | Wt 197.0 lb

## 2012-08-14 DIAGNOSIS — I517 Cardiomegaly: Secondary | ICD-10-CM | POA: Insufficient documentation

## 2012-08-14 DIAGNOSIS — I5032 Chronic diastolic (congestive) heart failure: Secondary | ICD-10-CM

## 2012-08-14 DIAGNOSIS — I4949 Other premature depolarization: Secondary | ICD-10-CM

## 2012-08-14 DIAGNOSIS — I10 Essential (primary) hypertension: Secondary | ICD-10-CM

## 2012-08-14 DIAGNOSIS — R0989 Other specified symptoms and signs involving the circulatory and respiratory systems: Secondary | ICD-10-CM | POA: Insufficient documentation

## 2012-08-14 DIAGNOSIS — R42 Dizziness and giddiness: Secondary | ICD-10-CM | POA: Insufficient documentation

## 2012-08-14 DIAGNOSIS — R0609 Other forms of dyspnea: Secondary | ICD-10-CM | POA: Insufficient documentation

## 2012-08-14 DIAGNOSIS — I2510 Atherosclerotic heart disease of native coronary artery without angina pectoris: Secondary | ICD-10-CM

## 2012-08-14 DIAGNOSIS — R0602 Shortness of breath: Secondary | ICD-10-CM

## 2012-08-14 MED ORDER — TECHNETIUM TC 99M SESTAMIBI GENERIC - CARDIOLITE
10.0000 | Freq: Once | INTRAVENOUS | Status: AC | PRN
  Administered 2012-08-14: 10 via INTRAVENOUS

## 2012-08-14 MED ORDER — TECHNETIUM TC 99M SESTAMIBI GENERIC - CARDIOLITE
30.0000 | Freq: Once | INTRAVENOUS | Status: AC | PRN
  Administered 2012-08-14: 30 via INTRAVENOUS

## 2012-08-14 NOTE — Progress Notes (Signed)
[facility] SITE 3 NUCLEAR MED [address] [place], [place] [address] [phone]    Cardiology Nuclear Med Study  [name] is a 72 y.o. male     MRN : [id number]     DOB: 1940-07-04  Procedure Date: 08/14/2012  Nuclear Med Background Indication for Stress Test:  Evaluation for Ischemia History:  '10 Cath:n/o CAD, EF=35%; medical tx; '13 Echo:EF=60%, mild AI and LVH Cardiac Risk Factors: Hypertension and Lipids  Symptoms:  Dizziness and DOE   Nuclear Pre-Procedure Caffeine/Decaff Intake:  None> 12 hrs NPO After: 3:00pm   Lungs:  Clear. O2 Sat: 94% on room air. IV 0.9% NS with Angio Cath:  22g  IV Site: R Antecubital x 1, tolerated well IV Started by:  Irean Hong, RN  Chest Size (in):  42 Cup Size: n/a  Height: 5\' 10"  (1.778 m)  Weight:  197 lb (89.359 kg)  BMI:  Body mass index is 28.27 kg/(m^2). Tech Comments:  No medications today    Nuclear Med Study 1 or 2 day study: 1 day  Stress Test Type:  Stress  Reading MD: [name], MD  Order Authorizing Provider:  Kristeen Miss, MD  Resting Radionuclide: Technetium 59m Sestamibi  Resting Radionuclide Dose: 11.0 mCi   Stress Radionuclide:  Technetium 46m Sestamibi  Stress Radionuclide Dose: 33.0 mCi           Stress Protocol Rest HR: 76 Stress HR: 133  Rest BP: 156/94 Stress BP: 207/92  Exercise Time (min): 3:00 METS: 4.6   Predicted Max HR: 149 bpm % Max HR: 89.26 bpm Rate Pressure Product: 21308   Dose of Adenosine (mg):  n/a Dose of Lexiscan: n/a mg  Dose of Atropine (mg): n/a Dose of Dobutamine: n/a mcg/kg/min (at max HR)  Stress Test Technologist: Smiley Houseman, CMA-N  Nuclear Technologist:  Doyne Keel, CNMT     Rest Procedure:  Myocardial perfusion imaging was performed at rest 45 minutes following the intravenous administration of Technetium 37m Sestamibi.  Rest ECG: NSR - Normal EKG  Stress Procedure:  The patient exercised on the treadmill utilizing the Bruce Protocol for  three minutes. The patient stopped due to dyspnea and fatigue and denied any chest pain.  Technetium 64m Sestamibi was injected at peak exercise and myocardial perfusion imaging was performed after a brief delay.  Stress ECG: No significant ST segment change suggestive of ischemia.  QPS Raw Data Images:  Acquisition technically good; normal left ventricular size. Stress Images:  There is decreased uptake in the inferior wall. Rest Images:  There is decreased uptake in the inferior wall. Subtraction (SDS):  No evidence of ischemia. Transient Ischemic Dilatation (Normal <1.22):  n/a Lung/Heart Ratio (Normal <0.45):  0.34  Quantitative Gated Spect Images QGS EDV:  112 ml QGS ESV:  52 ml  Impression Exercise Capacity:  Poor exercise capacity. BP Response:  Hypertensive blood pressure response. Clinical Symptoms:  There is dyspnea ECG Impression:  No significant ST segment change suggestive of ischemia. Comparison with Prior Nuclear Study: No images to compare  Overall Impression:  Low risk stress nuclear study with a small, mild, fixed inferior defect consistent with inferior thinning; no ischemia..  LV Ejection Fraction: 54%.  LV Wall Motion:  NL LV Function; NL Wall Motion  [name]

## 2012-08-18 ENCOUNTER — Encounter: Payer: Self-pay | Admitting: Physician Assistant

## 2012-08-21 ENCOUNTER — Encounter: Payer: Self-pay | Admitting: Cardiovascular Disease

## 2012-08-21 ENCOUNTER — Telehealth: Payer: Self-pay | Admitting: *Deleted

## 2012-08-21 NOTE — Telephone Encounter (Signed)
Message copied by Tarri Fuller on Mon Aug 21, 2012 11:08 AM ------      Message from: [place], [place] T      Created: Fri Aug 18, 2012  1:35 PM       Please inform patient stress test normal.      Tereso Newcomer, PA-C  1:35 PM 08/18/2012 ------

## 2012-08-21 NOTE — Telephone Encounter (Signed)
wife notified about normal myoview

## 2012-09-07 ENCOUNTER — Ambulatory Visit: Payer: BC Managed Care – PPO | Admitting: Cardiovascular Disease

## 2012-09-08 ENCOUNTER — Other Ambulatory Visit: Payer: Self-pay | Admitting: Cardiovascular Disease

## 2012-11-15 ENCOUNTER — Telehealth: Payer: Self-pay | Admitting: Cardiovascular Disease

## 2012-11-15 DIAGNOSIS — R0602 Shortness of breath: Secondary | ICD-10-CM

## 2012-11-15 DIAGNOSIS — R609 Edema, unspecified: Secondary | ICD-10-CM

## 2012-11-15 NOTE — Telephone Encounter (Signed)
C/O bilateral feet/toes and ankles swelling for over 1 week. C/o SOB on exertion but no changes otherwise. His diet was reviewed for hidden salt and pt is not getting additional salt. He is elevating his legs but swelling reduces only a small amount but not back to normal.  Pt is not on any diuretics.  Please advise.  07/12/11 last echo   ------------------------------------------------------------ Study Conclusions  - Left ventricle: The cavity size was normal. Wall thickness was increased in a pattern of mild LVH. Systolic function was normal. The estimated ejection fraction was in the range of 55% to 60%. Wall motion was normal; there were no regional wall motion abnormalities. Doppler parameters are consistent with abnormal left ventricular relaxation (grade 1 diastolic dysfunction). - Aortic valve: Mild regurgitation. Transthoracic echocardiography. M-mode, complete 2D, spectral Doppler, and color Doppler. Height: Height: 175.3cm. Height: 69in. Weight: Weight: 79.4kg. Weight: 174.6lb. Body mass index: BMI: 25.8kg/m^2. Body surface area: BSA: 1.49m^2. Blood pressure: 120/78. Patient status: Outpatient. Location: [facility]   08/14/12 last stress test, Overall Impression: Low risk stress nuclear study with a small, mild, fixed inferior defect consistent with inferior thinning; no ischemia..  LV Ejection Fraction: 54%. LV Wall Motion: NL LV Function; NL Wall Motion  [name]

## 2012-11-15 NOTE — Telephone Encounter (Signed)
New problem  Patients wife is concerned with the swelling in patients legs, please return her call.

## 2012-11-15 NOTE — Telephone Encounter (Signed)
I have not seen mr. Squitieri in a long time.  Will need an ov

## 2012-11-16 NOTE — Telephone Encounter (Signed)
I will speak with Dr Elease Hashimoto first before scheduling an app.  Wife was informed and is agreeable to plan. I will return call in am 10/24.

## 2012-11-16 NOTE — Telephone Encounter (Signed)
Follow Up:  [name] wife states she is calling Jodette back. [name] wife also states the pt's legs, feet, and toes are all swollen.... Please advise

## 2012-11-17 MED ORDER — POTASSIUM CHLORIDE ER 10 MEQ PO TBCR: 10.0000 meq | EXTENDED_RELEASE_TABLET | Freq: Every day | ORAL | Status: DC

## 2012-11-17 MED ORDER — FUROSEMIDE 40 MG PO TABS: 40.0000 mg | ORAL_TABLET | Freq: Every day | ORAL | Status: DC

## 2012-11-17 NOTE — Telephone Encounter (Signed)
Pt to start lasix 40 mg 1 daily and kdur 10 meq for 1 week . He is to return for bmet and ov in 2 weeks.

## 2012-11-22 ENCOUNTER — Encounter: Payer: Self-pay | Admitting: Cardiovascular Disease

## 2012-11-28 ENCOUNTER — Other Ambulatory Visit: Payer: Self-pay | Admitting: *Deleted

## 2012-11-28 ENCOUNTER — Other Ambulatory Visit: Payer: Self-pay | Admitting: Cardiovascular Disease

## 2012-11-28 DIAGNOSIS — I509 Heart failure, unspecified: Secondary | ICD-10-CM

## 2012-11-28 MED ORDER — LISINOPRIL 10 MG PO TABS: 10.0000 mg | ORAL_TABLET | Freq: Every day | ORAL | Status: DC

## 2012-12-01 ENCOUNTER — Ambulatory Visit: Payer: BC Managed Care – PPO | Admitting: Nurse Practitioner

## 2012-12-13 ENCOUNTER — Telehealth: Payer: Self-pay | Admitting: *Deleted

## 2012-12-13 NOTE — Telephone Encounter (Signed)
unable to leave msg for pt to call back, voice MSG states no one is available to take your call then disconnects. Pt missed last two app/ pt needs bmet and f/u.

## 2012-12-29 ENCOUNTER — Ambulatory Visit (INDEPENDENT_AMBULATORY_CARE_PROVIDER_SITE_OTHER): Payer: BC Managed Care – PPO | Admitting: Nurse Practitioner

## 2012-12-29 ENCOUNTER — Encounter (INDEPENDENT_AMBULATORY_CARE_PROVIDER_SITE_OTHER): Payer: Self-pay

## 2012-12-29 ENCOUNTER — Encounter: Payer: Self-pay | Admitting: Nurse Practitioner

## 2012-12-29 ENCOUNTER — Other Ambulatory Visit: Payer: BC Managed Care – PPO

## 2012-12-29 VITALS — BP 152/88 | HR 83 | Ht 70.0 in | Wt 203.0 lb

## 2012-12-29 DIAGNOSIS — R0602 Shortness of breath: Secondary | ICD-10-CM

## 2012-12-29 DIAGNOSIS — R0600 Dyspnea, unspecified: Secondary | ICD-10-CM

## 2012-12-29 DIAGNOSIS — R609 Edema, unspecified: Secondary | ICD-10-CM

## 2012-12-29 DIAGNOSIS — R0609 Other forms of dyspnea: Secondary | ICD-10-CM

## 2012-12-29 LAB — BASIC METABOLIC PANEL
BUN: 27 mg/dL — ABNORMAL HIGH (ref 6–23)
CO2: 27 mEq/L (ref 19–32)
Calcium: 9.1 mg/dL (ref 8.4–10.5)
Chloride: 106 mEq/L (ref 96–112)
Creatinine, Ser: 1.5 mg/dL (ref 0.4–1.5)
GFR: 48.9 mL/min — ABNORMAL LOW (ref >60.00)
Glucose, Bld: 97 mg/dL (ref 70–99)
Potassium: 3.4 mEq/L — ABNORMAL LOW (ref 3.5–5.1)
Sodium: 141 mEq/L (ref 135–145)

## 2012-12-29 LAB — CBC WITH DIFFERENTIAL/PLATELET
Basophils Absolute: 0.1 10*3/uL (ref 0.0–0.1)
Basophils Relative: 0.6 % (ref 0.0–3.0)
Eosinophils Absolute: 0.2 10*3/uL (ref 0.0–0.7)
Eosinophils Relative: 1.6 % (ref 0.0–5.0)
HCT: 40.2 % (ref 39.0–52.0)
Hemoglobin: 13.3 g/dL (ref 13.0–17.0)
Lymphocytes Relative: 17.5 % (ref 12.0–46.0)
Lymphs Abs: 2.3 10*3/uL (ref 0.7–4.0)
MCHC: 33.2 g/dL (ref 30.0–36.0)
MCV: 92.1 fl (ref 78.0–100.0)
Monocytes Absolute: 1.1 10*3/uL — ABNORMAL HIGH (ref 0.1–1.0)
Monocytes Relative: 8.2 % (ref 3.0–12.0)
Neutro Abs: 9.3 10*3/uL — ABNORMAL HIGH (ref 1.4–7.7)
Neutrophils Relative %: 72.1 % (ref 43.0–77.0)
Platelets: 232 10*3/uL (ref 150.0–400.0)
RBC: 4.36 Mil/uL (ref 4.22–5.81)
RDW: 15.4 % — ABNORMAL HIGH (ref 11.5–14.6)
WBC: 13 10*3/uL — ABNORMAL HIGH (ref 4.5–10.5)

## 2012-12-29 LAB — BRAIN NATRIURETIC PEPTIDE: Pro B Natriuretic peptide (BNP): 80 pg/mL (ref 0.0–100.0)

## 2012-12-29 MED ORDER — POTASSIUM CHLORIDE ER 10 MEQ PO TBCR: 10.0000 meq | EXTENDED_RELEASE_TABLET | Freq: Every day | ORAL | Status: DC

## 2012-12-29 MED ORDER — FUROSEMIDE 40 MG PO TABS: 40.0000 mg | ORAL_TABLET | Freq: Every day | ORAL | Status: DC

## 2012-12-29 NOTE — Progress Notes (Addendum)
[name] Date of Birth: [birth year] Medical Record #[id number]  History of Present Illness: Mr. Casher is seen back today for a follow up visit. Seen for Dr. Elease Hashimoto. He has a NICM with systolic HF since 2010, secondary pulmonary HTN, nonobstructive CAD per cath in 2010, Addison's disease, hypothyroidism, hypogonadism, HTN, and CKD. EF was down to 30 to 35% but last echo in June of 2013 showed improvement to 55 to 60%.   Was seen back here in July by Tereso Newcomer, PA - was to have been seen back in just a couple of weeks - has not followed thru. He reported being more short of breath at that time. Myoview was carried out. Referral to pulmonary was mentioned.   Called back in October with swelling - lasix was added - continued to miss follow up visits.   Comes in today. Here with his wife. Back here with the same complaint - short of breath with exertion. No real cough. Has had more swelling - took the Lasix and potassium for a week - some improvement but now not taking. ?salt use. Rare PND.   Current Outpatient Prescriptions  Medication Sig Dispense Refill  . amLODipine (NORVASC) 5 MG tablet Take 2.5 mg by mouth daily.       . ANDROGEL PUMP 20.25 MG/ACT (1.62%) GEL daily.       Marland Kitchen aspirin 81 MG tablet Take 81 mg by mouth daily.        . carvedilol (COREG) 25 MG tablet TAKE 1 TABLET TWICE A DAY WITH MEALS  180 tablet  3  . Cholecalciferol (VITAMIN D3 PO) Take by mouth.        . cyclobenzaprine (FLEXERIL) 10 MG tablet Take 10 mg by mouth 3 (three) times daily as needed for muscle spasms.      . hydrocortisone (CORTEF) 20 MG tablet Take 20 mg by mouth 2 (two) times daily.       Marland Kitchen lisinopril (PRINIVIL,ZESTRIL) 10 MG tablet Take 1 tablet (10 mg total) by mouth daily.  90 tablet  0  . omeprazole (PRILOSEC) 20 MG capsule Take 20 mg by mouth daily.        . promethazine (PHENERGAN) 25 MG tablet Take 25 mg by mouth every 6 (six) hours as needed.        . thyroid (ARMOUR) 60 MG tablet Take  60 mg by mouth daily.        . traMADol (ULTRAM) 50 MG tablet Take 50 mg by mouth every 6 (six) hours as needed.        . traZODone (DESYREL) 100 MG tablet Take 50 mg by mouth at bedtime.       . furosemide (LASIX) 40 MG tablet Take 1 tablet (40 mg total) by mouth daily. For one week then hold  30 tablet  0  . potassium chloride (K-DUR) 10 MEQ tablet Take 1 tablet (10 mEq total) by mouth daily. For one week then hold  30 tablet  0   No current facility-administered medications for this visit.    No Known Allergies  Past Medical History  Diagnosis Date  . CHF (congestive heart failure)     His initial ejection fraction was between 30 and 35%. His most recent echocardiogram performed in March of 2012 reveals normal left ventricular systolic function with an EF of 60-65%.  . Thyroid disease     addisons  . Pulmonary HTN   . Hypertension   . Addison's disease   . Hypogonadism  male   . Renal insufficiency   . History of aortic insufficiency   . SOB (shortness of breath)   . Hx of cardiovascular stress test     ETT-Myoview 7/14:  Low risk, inferior defect consistent with thinning, no ischemia, normal wall motion, EF 54%    Past Surgical History  Procedure Laterality Date  . Cholecystectomy    . Cardiac catheterization  06/26/2008    EF 30-35%  . US echocardiography  04/09/2010    EF 60-65%    History  Smoking status  . Never Smoker   Smokeless tobacco  . Not on file    History  Alcohol Use No    History reviewed. No pertinent family history.  Review of Systems: The review of systems is per the HPI.  All other systems were reviewed and are negative.  Physical Exam: BP 152/88  Pulse 83  Ht 5\' 10"  (1.778 m)  Wt 203 lb (92.08 kg)  BMI 29.13 kg/m2  SpO2 97% Weight is up about 6 pounds Patient is very pleasant and in no acute distress. His voice is quite hoarse (says this is normal for him) Skin is warm and dry. Color is normal.  HEENT is unremarkable.  Normocephalic/atraumatic. PERRL. Sclera are nonicteric. Neck is supple. No masses. No JVD. Lungs are clear. Cardiac exam shows a regular rate and rhythm. Abdomen is soft. Extremities are with trace edema. Gait and ROM are intact. No gross neurologic deficits noted.  Wt Readings from Last 3 Encounters:  12/29/12 203 lb (92.08 kg)  08/14/12 197 lb (89.359 kg)  08/08/12 201 lb (91.173 kg)     LABORATORY DATA:   Chemistry      Component Value Date/Time   NA 140 08/08/2012 0930   K 3.9 08/08/2012 0930   CL 104 08/08/2012 0930   CO2 26 08/08/2012 0930   BUN 19 08/08/2012 0930   CREATININE 1.4 08/08/2012 0930      Component Value Date/Time   CALCIUM 9.5 08/08/2012 0930   ALKPHOS 59 05/05/2010 1146   AST 20 05/05/2010 1146   ALT 20 05/05/2010 1146   BILITOT 0.6 05/05/2010 1146     Lab Results  Component Value Date   WBC 15.5* 08/08/2012   HGB 14.6 08/08/2012   HCT 43.9 08/08/2012   MCV 94.7 08/08/2012   PLT 257.0 08/08/2012   Lab Results  Component Value Date   CHOL 180 05/05/2010   HDL 31.30* 05/05/2010   LDLCALC 123* 05/05/2010   TRIG 128.0 05/05/2010   CHOLHDL 6 05/05/2010    Myoview Impression July 2014  Exercise Capacity: Poor exercise capacity.  BP Response: Hypertensive blood pressure response.  Clinical Symptoms: There is dyspnea  ECG Impression: No significant ST segment change suggestive of ischemia.  Comparison with Prior Nuclear Study: No images to compare  Overall Impression: Low risk stress nuclear study with a small, mild, fixed inferior defect consistent with inferior thinning; no ischemia..  LV Ejection Fraction: 54%. LV Wall Motion: NL LV Function; NL Wall Motion  [name]    Echo Study Conclusions from June 2013  - Left ventricle: The cavity size was normal. Wall thickness was increased in a pattern of mild LVH. Systolic function was normal. The estimated ejection fraction was in the range of 55% to 60%. Wall motion was normal; there were no regional wall  motion abnormalities. Doppler parameters are consistent with abnormal left ventricular relaxation (grade 1 diastolic dysfunction). - Aortic valve: Mild regurgitation.   Assessment / Plan: 1.  NICM - last echo showed EF of 55 to 60% - will check labs today - add back potassium and lasix. See him back in a month.   2. Dyspnea - most likely multifactorial - has pulmonary HTN, obesity, past systolic HF - will update his echo, check labs, restart diuretic and refer to Dr. Delton Coombes of pulmonary.  3. Nonobstructive CAD - no chest pain reported. Had negative Myoview in July.  Patient is agreeable to this plan and will call if any problems develop in the interim.   Rosalio Macadamia, RN, ANP-C [facility] [address] [place], [place]  [address]

## 2012-12-29 NOTE — Patient Instructions (Addendum)
Stay on your current medicines but add back the Lasix 40 mg and the Potassium daily  We need to check labs today  We will send you to pulmonary - Dr. Delton Coombes  We need to update your ultrasound of your heart  See Dr. Elease Hashimoto back in a month  Call the [facility] office at [phone] if you have any questions, problems or concerns.

## 2013-01-09 ENCOUNTER — Institutional Professional Consult (permissible substitution): Payer: BC Managed Care – PPO | Admitting: Emergency Medicine

## 2013-01-10 ENCOUNTER — Encounter: Payer: Self-pay | Admitting: Emergency Medicine

## 2013-01-22 ENCOUNTER — Other Ambulatory Visit (HOSPITAL_COMMUNITY): Payer: BC Managed Care – PPO

## 2013-01-22 ENCOUNTER — Encounter: Payer: Self-pay | Admitting: Emergency Medicine

## 2013-02-08 ENCOUNTER — Ambulatory Visit: Payer: BC Managed Care – PPO | Admitting: Cardiovascular Disease

## 2013-02-09 ENCOUNTER — Other Ambulatory Visit: Payer: Self-pay | Admitting: Cardiovascular Disease

## 2013-02-12 ENCOUNTER — Institutional Professional Consult (permissible substitution): Payer: BC Managed Care – PPO | Admitting: Emergency Medicine

## 2013-02-15 ENCOUNTER — Encounter: Payer: Self-pay | Admitting: Emergency Medicine

## 2013-03-12 ENCOUNTER — Ambulatory Visit: Payer: BC Managed Care – PPO | Admitting: Cardiovascular Disease

## 2013-03-12 ENCOUNTER — Other Ambulatory Visit (HOSPITAL_COMMUNITY): Payer: Self-pay | Admitting: Family Medicine

## 2013-03-12 ENCOUNTER — Ambulatory Visit (HOSPITAL_COMMUNITY)
Admission: RE | Admit: 2013-03-12 | Discharge: 2013-03-12 | Disposition: A | Discharge status: Home / Self Care | Payer: BC Managed Care – PPO | Source: Ambulatory Visit | Attending: Family Medicine | Admitting: Family Medicine

## 2013-03-12 ENCOUNTER — Telehealth: Payer: Self-pay | Admitting: Cardiovascular Disease

## 2013-03-12 DIAGNOSIS — M7989 Other specified soft tissue disorders: Secondary | ICD-10-CM | POA: Insufficient documentation

## 2013-03-12 DIAGNOSIS — M25569 Pain in unspecified knee: Secondary | ICD-10-CM

## 2013-03-12 DIAGNOSIS — M79609 Pain in unspecified limb: Secondary | ICD-10-CM

## 2013-03-12 NOTE — Telephone Encounter (Signed)
Pt is taking him to pcp now to assess leg pain.

## 2013-03-12 NOTE — Telephone Encounter (Signed)
New message     C/o last week upper thigh started hurting and pain went down to his calf.  Yesterday ankle was swollen.  Pt has CHF.  Is this a heart problem?

## 2013-03-13 ENCOUNTER — Other Ambulatory Visit: Payer: Self-pay | Admitting: Family Medicine

## 2013-03-26 ENCOUNTER — Ambulatory Visit: Payer: BC Managed Care – PPO | Admitting: Cardiovascular Disease

## 2013-03-27 ENCOUNTER — Encounter: Payer: Self-pay | Admitting: Cardiovascular Disease

## 2013-04-11 ENCOUNTER — Encounter (HOSPITAL_COMMUNITY)
Admission: EM | Disposition: A | Discharge status: Home / Self Care | Payer: Self-pay | Source: Home / Self Care | Attending: Internal Medicine

## 2013-04-11 ENCOUNTER — Encounter (HOSPITAL_COMMUNITY): Payer: Self-pay | Admitting: Emergency Medicine

## 2013-04-11 ENCOUNTER — Inpatient Hospital Stay (HOSPITAL_COMMUNITY)
Admission: EM | Admit: 2013-04-11 | Discharge: 2013-04-12 | DRG: 378 | Disposition: A | Discharge status: Home / Self Care | Payer: BC Managed Care – PPO | Attending: Internal Medicine | Admitting: Internal Medicine

## 2013-04-11 DIAGNOSIS — K922 Gastrointestinal hemorrhage, unspecified: Secondary | ICD-10-CM | POA: Diagnosis present

## 2013-04-11 DIAGNOSIS — E039 Hypothyroidism, unspecified: Secondary | ICD-10-CM | POA: Diagnosis present

## 2013-04-11 DIAGNOSIS — Z9189 Other specified personal risk factors, not elsewhere classified: Secondary | ICD-10-CM

## 2013-04-11 DIAGNOSIS — R0609 Other forms of dyspnea: Secondary | ICD-10-CM

## 2013-04-11 DIAGNOSIS — I10 Essential (primary) hypertension: Secondary | ICD-10-CM | POA: Diagnosis present

## 2013-04-11 DIAGNOSIS — D500 Iron deficiency anemia secondary to blood loss (chronic): Secondary | ICD-10-CM | POA: Diagnosis present

## 2013-04-11 DIAGNOSIS — I5030 Unspecified diastolic (congestive) heart failure: Secondary | ICD-10-CM | POA: Diagnosis present

## 2013-04-11 DIAGNOSIS — R5383 Other fatigue: Secondary | ICD-10-CM

## 2013-04-11 DIAGNOSIS — E291 Testicular hypofunction: Secondary | ICD-10-CM

## 2013-04-11 DIAGNOSIS — D62 Acute posthemorrhagic anemia: Secondary | ICD-10-CM | POA: Diagnosis present

## 2013-04-11 DIAGNOSIS — IMO0002 Reserved for concepts with insufficient information to code with codable children: Secondary | ICD-10-CM

## 2013-04-11 DIAGNOSIS — I359 Nonrheumatic aortic valve disorder, unspecified: Secondary | ICD-10-CM | POA: Diagnosis present

## 2013-04-11 DIAGNOSIS — D510 Vitamin B12 deficiency anemia due to intrinsic factor deficiency: Secondary | ICD-10-CM

## 2013-04-11 DIAGNOSIS — K449 Diaphragmatic hernia without obstruction or gangrene: Secondary | ICD-10-CM | POA: Diagnosis present

## 2013-04-11 DIAGNOSIS — E271 Primary adrenocortical insufficiency: Secondary | ICD-10-CM

## 2013-04-11 DIAGNOSIS — I509 Heart failure, unspecified: Secondary | ICD-10-CM | POA: Diagnosis present

## 2013-04-11 DIAGNOSIS — D689 Coagulation defect, unspecified: Secondary | ICD-10-CM

## 2013-04-11 DIAGNOSIS — T39095A Adverse effect of salicylates, initial encounter: Secondary | ICD-10-CM | POA: Diagnosis present

## 2013-04-11 DIAGNOSIS — E860 Dehydration: Secondary | ICD-10-CM

## 2013-04-11 DIAGNOSIS — R791 Abnormal coagulation profile: Secondary | ICD-10-CM | POA: Diagnosis present

## 2013-04-11 DIAGNOSIS — R0989 Other specified symptoms and signs involving the circulatory and respiratory systems: Secondary | ICD-10-CM

## 2013-04-11 DIAGNOSIS — Z86718 Personal history of other venous thrombosis and embolism: Secondary | ICD-10-CM

## 2013-04-11 DIAGNOSIS — Z7982 Long term (current) use of aspirin: Secondary | ICD-10-CM

## 2013-04-11 DIAGNOSIS — Y92009 Unspecified place in unspecified non-institutional (private) residence as the place of occurrence of the external cause: Secondary | ICD-10-CM

## 2013-04-11 DIAGNOSIS — K279 Peptic ulcer, site unspecified, unspecified as acute or chronic, without hemorrhage or perforation: Secondary | ICD-10-CM | POA: Diagnosis present

## 2013-04-11 DIAGNOSIS — K254 Chronic or unspecified gastric ulcer with hemorrhage: Principal | ICD-10-CM | POA: Diagnosis present

## 2013-04-11 DIAGNOSIS — I2789 Other specified pulmonary heart diseases: Secondary | ICD-10-CM | POA: Diagnosis present

## 2013-04-11 DIAGNOSIS — N289 Disorder of kidney and ureter, unspecified: Secondary | ICD-10-CM

## 2013-04-11 DIAGNOSIS — E2749 Other adrenocortical insufficiency: Secondary | ICD-10-CM | POA: Diagnosis present

## 2013-04-11 HISTORY — DX: Inflammatory liver disease, unspecified: K75.9

## 2013-04-11 HISTORY — DX: Peptic ulcer, site unspecified, unspecified as acute or chronic, without hemorrhage or perforation: K27.9

## 2013-04-11 HISTORY — DX: Personal history of other diseases of the digestive system: Z87.19

## 2013-04-11 HISTORY — DX: Headache, unspecified: R51.9

## 2013-04-11 HISTORY — DX: Gastro-esophageal reflux disease without esophagitis: K21.9

## 2013-04-11 HISTORY — DX: Other chronic pain: G89.29

## 2013-04-11 HISTORY — DX: Headache: R51

## 2013-04-11 HISTORY — DX: Gastrointestinal hemorrhage, unspecified: K92.2

## 2013-04-11 HISTORY — DX: Dorsalgia, unspecified: M54.9

## 2013-04-11 HISTORY — PX: ESOPHAGOGASTRODUODENOSCOPY: SHX5428

## 2013-04-11 LAB — CBC WITH DIFFERENTIAL/PLATELET
BASOS ABS: 0.1 10*3/uL (ref 0.0–0.1)
BASOS PCT: 0 % (ref 0–1)
Eosinophils Absolute: 0.3 10*3/uL (ref 0.0–0.7)
Eosinophils Relative: 2 % (ref 0–5)
HEMATOCRIT: 22.4 % — AB (ref 39.0–52.0)
Hemoglobin: 7 g/dL — ABNORMAL LOW (ref 13.0–17.0)
LYMPHS PCT: 25 % (ref 12–46)
Lymphs Abs: 3.2 10*3/uL (ref 0.7–4.0)
MCH: 29 pg (ref 26.0–34.0)
MCHC: 31.3 g/dL (ref 30.0–36.0)
MCV: 92.9 fL (ref 78.0–100.0)
MONO ABS: 1.1 10*3/uL — AB (ref 0.1–1.0)
Monocytes Relative: 8 % (ref 3–12)
NEUTROS ABS: 8.2 10*3/uL — AB (ref 1.7–7.7)
NEUTROS PCT: 64 % (ref 43–77)
PLATELETS: 285 10*3/uL (ref 150–400)
RBC: 2.41 MIL/uL — ABNORMAL LOW (ref 4.22–5.81)
RDW: 14.8 % (ref 11.5–15.5)
WBC: 12.9 10*3/uL — AB (ref 4.0–10.5)

## 2013-04-11 LAB — COMPREHENSIVE METABOLIC PANEL
ALT: 11 U/L (ref 0–53)
AST: 11 U/L (ref 0–37)
Albumin: 2.9 g/dL — ABNORMAL LOW (ref 3.5–5.2)
Alkaline Phosphatase: 44 U/L (ref 39–117)
BUN: 30 mg/dL — ABNORMAL HIGH (ref 6–23)
CHLORIDE: 106 meq/L (ref 96–112)
CO2: 23 meq/L (ref 19–32)
Calcium: 8.6 mg/dL (ref 8.4–10.5)
Creatinine, Ser: 1.33 mg/dL (ref 0.50–1.35)
GFR, EST AFRICAN AMERICAN: 60 mL/min — AB (ref >90)
GFR, EST NON AFRICAN AMERICAN: 52 mL/min — AB (ref >90)
GLUCOSE: 101 mg/dL — AB (ref 70–99)
Potassium: 3.5 mEq/L — ABNORMAL LOW (ref 3.7–5.3)
Sodium: 142 mEq/L (ref 137–147)
Total Protein: 5.4 g/dL — ABNORMAL LOW (ref 6.0–8.3)

## 2013-04-11 LAB — PREPARE RBC (CROSSMATCH)

## 2013-04-11 LAB — ABO/RH: ABO/RH(D): B POS

## 2013-04-11 LAB — PROTIME-INR
INR: 2.37 — ABNORMAL HIGH (ref 0.00–1.49)
Prothrombin Time: 25.1 seconds — ABNORMAL HIGH (ref 11.6–15.2)

## 2013-04-11 LAB — APTT: aPTT: 32 seconds (ref 24–37)

## 2013-04-11 LAB — POC OCCULT BLOOD, ED: Fecal Occult Bld: POSITIVE — AB

## 2013-04-11 SURGERY — EGD (ESOPHAGOGASTRODUODENOSCOPY)
Anesthesia: Moderate Sedation

## 2013-04-11 MED ORDER — HYDROCODONE-ACETAMINOPHEN 5-325 MG PO TABS
1.0000 | ORAL_TABLET | ORAL | Status: DC | PRN
  Administered 2013-04-11 (×2): 2 via ORAL
  Filled 2013-04-11 (×2): qty 2

## 2013-04-11 MED ORDER — PROMETHAZINE HCL 25 MG PO TABS
25.0000 mg | ORAL_TABLET | Freq: Every day | ORAL | Status: DC | PRN
Start: 2013-04-11 — End: 2013-04-12

## 2013-04-11 MED ORDER — THYROID 60 MG PO TABS
60.0000 mg | ORAL_TABLET | Freq: Every day | ORAL | Status: DC
  Administered 2013-04-11 – 2013-04-12 (×2): 60 mg via ORAL
  Filled 2013-04-11 (×4): qty 1

## 2013-04-11 MED ORDER — FENTANYL CITRATE 0.05 MG/ML IJ SOLN
INTRAMUSCULAR | Status: DC | PRN
  Administered 2013-04-11 (×2): 25 ug via INTRAVENOUS

## 2013-04-11 MED ORDER — SODIUM CHLORIDE 0.9 % IJ SOLN
3.0000 mL | Freq: Two times a day (BID) | INTRAMUSCULAR | Status: DC
  Administered 2013-04-12: 3 mL via INTRAVENOUS

## 2013-04-11 MED ORDER — HYDROCORTISONE 20 MG PO TABS
20.0000 mg | ORAL_TABLET | Freq: Two times a day (BID) | ORAL | Status: DC
  Administered 2013-04-11 – 2013-04-12 (×3): 20 mg via ORAL
  Filled 2013-04-11 (×6): qty 1

## 2013-04-11 MED ORDER — TRAMADOL HCL 50 MG PO TABS
100.0000 mg | ORAL_TABLET | Freq: Every day | ORAL | Status: DC
  Administered 2013-04-11: 100 mg via ORAL
  Filled 2013-04-11: qty 2

## 2013-04-11 MED ORDER — SODIUM CHLORIDE 0.9 % IV BOLUS (SEPSIS)
1000.0000 mL | Freq: Once | INTRAVENOUS | Status: DC
  Administered 2013-04-11: 1000 mL via INTRAVENOUS

## 2013-04-11 MED ORDER — SODIUM CHLORIDE 0.9 % IV SOLN: INTRAVENOUS | Status: DC

## 2013-04-11 MED ORDER — PROTHROMBIN COMPLEX CONC HUMAN 500 UNITS IV KIT
4518.0000 [IU] | PACK | Status: AC
  Administered 2013-04-11: 4518 [IU] via INTRAVENOUS
  Filled 2013-04-11: qty 181

## 2013-04-11 MED ORDER — SODIUM CHLORIDE 0.9 % IJ SOLN: 3.0000 mL | INTRAMUSCULAR | Status: DC | PRN

## 2013-04-11 MED ORDER — LISINOPRIL 10 MG PO TABS
10.0000 mg | ORAL_TABLET | Freq: Every day | ORAL | Status: DC
  Administered 2013-04-11 – 2013-04-12 (×2): 10 mg via ORAL
  Filled 2013-04-11 (×3): qty 1

## 2013-04-11 MED ORDER — SODIUM CHLORIDE 0.9 % IV SOLN: 250.0000 mL | INTRAVENOUS | Status: DC | PRN

## 2013-04-11 MED ORDER — BUTAMBEN-TETRACAINE-BENZOCAINE 2-2-14 % EX AERO
INHALATION_SPRAY | CUTANEOUS | Status: DC | PRN
  Administered 2013-04-11: 1 via TOPICAL

## 2013-04-11 MED ORDER — ONDANSETRON HCL 4 MG PO TABS: 4.0000 mg | ORAL_TABLET | Freq: Four times a day (QID) | ORAL | Status: DC | PRN

## 2013-04-11 MED ORDER — ACETAMINOPHEN 500 MG PO TABS
1000.0000 mg | ORAL_TABLET | Freq: Every day | ORAL | Status: DC
  Administered 2013-04-11 – 2013-04-12 (×2): 1000 mg via ORAL
  Filled 2013-04-11 (×3): qty 2

## 2013-04-11 MED ORDER — FENTANYL CITRATE 0.05 MG/ML IJ SOLN
INTRAMUSCULAR | Status: AC
  Filled 2013-04-11: qty 2

## 2013-04-11 MED ORDER — ONDANSETRON HCL 4 MG/2ML IJ SOLN: 4.0000 mg | Freq: Four times a day (QID) | INTRAMUSCULAR | Status: DC | PRN

## 2013-04-11 MED ORDER — CARVEDILOL 25 MG PO TABS
25.0000 mg | ORAL_TABLET | Freq: Two times a day (BID) | ORAL | Status: DC
  Administered 2013-04-11 – 2013-04-12 (×2): 25 mg via ORAL
  Filled 2013-04-11 (×5): qty 1

## 2013-04-11 MED ORDER — CYCLOBENZAPRINE HCL 10 MG PO TABS: 10.0000 mg | ORAL_TABLET | Freq: Three times a day (TID) | ORAL | Status: DC | PRN

## 2013-04-11 MED ORDER — PANTOPRAZOLE SODIUM 40 MG PO TBEC
40.0000 mg | DELAYED_RELEASE_TABLET | Freq: Every day | ORAL | Status: DC
  Administered 2013-04-11 – 2013-04-12 (×2): 40 mg via ORAL
  Filled 2013-04-11 (×2): qty 1

## 2013-04-11 MED ORDER — MIDAZOLAM HCL 10 MG/2ML IJ SOLN
INTRAMUSCULAR | Status: DC | PRN
  Administered 2013-04-11: 2 mg via INTRAVENOUS
  Administered 2013-04-11: 1 mg via INTRAVENOUS

## 2013-04-11 MED ORDER — MIDAZOLAM HCL 5 MG/ML IJ SOLN
INTRAMUSCULAR | Status: AC
  Filled 2013-04-11: qty 2

## 2013-04-11 MED ORDER — SODIUM CHLORIDE 0.9 % IJ SOLN
3.0000 mL | Freq: Two times a day (BID) | INTRAMUSCULAR | Status: DC
  Administered 2013-04-11 – 2013-04-12 (×3): 3 mL via INTRAVENOUS

## 2013-04-11 MED ORDER — AMLODIPINE BESYLATE 2.5 MG PO TABS
2.5000 mg | ORAL_TABLET | Freq: Every day | ORAL | Status: DC
  Administered 2013-04-11 – 2013-04-12 (×2): 2.5 mg via ORAL
  Filled 2013-04-11 (×3): qty 1

## 2013-04-11 NOTE — Progress Notes (Signed)
EGD done: See formal report. 3 small antral ulcers seen. Would hold off on anticoagulation a few days and patient instructed to substitute Tylenol for [facility] powders for aches and pains. We'll followup in 2 days and check Helicobacter status.

## 2013-04-11 NOTE — ED Notes (Signed)
Pt noticed the past week has had dark tarry stools. 4 days ago pt started feeling weak and has increased/ feels dizzy when standing. Pt is orthostatic- sitting BP 210/106, standing 182/90. HR 80's. Pt recently placed on xarelto for DVT's. LBM today. Pt no diaphoretic, pt nauseated but has not vomited.

## 2013-04-11 NOTE — ED Notes (Signed)
Hospitalist consult at bedside. 

## 2013-04-11 NOTE — ED Notes (Signed)
Pt lying on stretcher resting. Blood administration transfusing.

## 2013-04-11 NOTE — Progress Notes (Signed)
Pt admitted to the unit at 1630. Pt mental status is A&Ox4. Pt oriented to room, staff, and call bell. Skin is intact. Full assessment charted in CH[facility]. Call bell within reach. Visitor guidelines reviewed w/ pt and/or family.    

## 2013-04-11 NOTE — Op Note (Signed)
[facility] [place], [address]   ENDOSCOPY PROCEDURE REPORT  PATIENT: Levorn, Oleski  MR#: [id number] BIRTHDATE: October 20, 1940 , 72  yrs. old GENDER: Male ENDOSCOPIST:Shanan Mcmiller Amedeo Plenty, MD REFERRED BY: PROCEDURE DATE:  04/11/2013 PROCEDURE: ASA CLASS: INDICATIONS:  melena and anemia MEDICATION:   fentanyl 50 mcg, Versed 4 mg TOPICAL ANESTHETIC:    Cetacaine spray  DESCRIPTION OF PROCEDURE:   esophagus: Small sliding hiatal hernia otherwise normal Stomach: No old blood seen some slight residual food. 3 small prepyloric ulcers were seen within 2 cm of the pylorus. One was somewhat linear and serpiginous . other 2 rounded and approximately 4 mm.one of them had a flat rather translucent visible vessel without strong stigmata of hemorrhage. A CLOtest was obtained. 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 Review last colonoscopy and consider repeating if due. await CLOtest and treat for eradication of Helicobacter if positive.    _______________________________ Lorrin MaisTeena Irani, MD 04/11/2013 3:15 PM

## 2013-04-11 NOTE — Progress Notes (Signed)
Called for report from [place] @ 1340 unavailable at that time will call back.

## 2013-04-11 NOTE — Consult Note (Signed)
[facility] Consult Note  Referring Provider: No ref. provider found Primary Care Physician:   Melinda Crutch, MD Primary Gastroenterologist:  Dr.  Laurel Dimmer Complaint: Black stools HPI: [name] is an 73 y.o. white male  who presents with a five-day history of 3-4 lack bowel movements per day with progressive weakness and was found to have a hemoglobin of 7 with a slightly elevated BUN. He was recently started on the anticoagulant xarelto 1 week ago. He also takes [place] powders up to 5 a day. He has no abdominal pain nausea or vomiting. He denies any bright red blood per rectum. He has been started on Protonix and transfusion of packed red blood cells is been initiated.  Past Medical History  Diagnosis Date  . CHF (congestive heart failure)     His initial ejection fraction was between 30 and 35%. His most recent echocardiogram performed in March of 2012 reveals normal left ventricular systolic function with an EF of 60-65%.  . Thyroid disease     addisons  . Pulmonary HTN   . Hypertension   . Addison's disease   . Hypogonadism male   . Renal insufficiency   . History of aortic insufficiency   . SOB (shortness of breath)   . Hx of cardiovascular stress test     ETT-Myoview 7/14:  Low risk, inferior defect consistent with thinning, no ischemia, normal wall motion, EF 54%  . DVT (deep venous thrombosis)     Past Surgical History  Procedure Laterality Date  . Cholecystectomy    . Cardiac catheterization  06/26/2008    EF 30-35%  . US echocardiography  04/09/2010    EF 60-65%     (Not in a hospital admission)  Allergies: No Known Allergies  History reviewed. No pertinent family history.  Social History:  reports that he has never smoked. He does not have any smokeless tobacco history on file. He reports that he does not drink alcohol or use illicit drugs.  Review of Systems: negative except as above   Blood pressure 144/85, pulse 82, temperature 98.6 F (37  C), temperature source Oral, resp. rate 13, height 5' 9"  (1.753 m), weight 92.08 kg (203 lb), SpO2 99.00%. Head: Normocephalic, without obvious abnormality, atraumatic Neck: no adenopathy, no carotid bruit, no JVD, supple, symmetrical, trachea midline and thyroid not enlarged, symmetric, no tenderness/mass/nodules Resp: clear to auscultation bilaterally Cardio: regular rate and rhythm, S1, S2 normal, no murmur, click, rub or gallop GI: Abdomen soft nondistended with normoactive bowel sounds. No hepatosplenomegaly mass or guarding. Extremities: extremities normal, atraumatic, no cyanosis or edema  Results for orders placed during the hospital encounter of 04/11/13 (from the past 48 hour(s))  POC OCCULT BLOOD, ED     Status: Abnormal   Collection Time    04/11/13  9:25 AM      Result Value Ref Range   Fecal Occult Bld POSITIVE (*) NEGATIVE  CBC WITH DIFFERENTIAL     Status: Abnormal   Collection Time    04/11/13 10:00 AM      Result Value Ref Range   WBC 12.9 (*) 4.0 - 10.5 K/uL   RBC 2.41 (*) 4.22 - 5.81 MIL/uL   Hemoglobin 7.0 (*) 13.0 - 17.0 g/dL   HCT 22.4 (*) 39.0 - 52.0 %   MCV 92.9  78.0 - 100.0 fL   MCH 29.0  26.0 - 34.0 pg   MCHC 31.3  30.0 - 36.0 g/dL   RDW 14.8  11.5 - 15.5 %  Platelets 285  150 - 400 K/uL   Neutrophils Relative % 64  43 - 77 %   Neutro Abs 8.2 (*) 1.7 - 7.7 K/uL   Lymphocytes Relative 25  12 - 46 %   Lymphs Abs 3.2  0.7 - 4.0 K/uL   Monocytes Relative 8  3 - 12 %   Monocytes Absolute 1.1 (*) 0.1 - 1.0 K/uL   Eosinophils Relative 2  0 - 5 %   Eosinophils Absolute 0.3  0.0 - 0.7 K/uL   Basophils Relative 0  0 - 1 %   Basophils Absolute 0.1  0.0 - 0.1 K/uL  COMPREHENSIVE METABOLIC PANEL     Status: Abnormal   Collection Time    04/11/13 10:00 AM      Result Value Ref Range   Sodium 142  137 - 147 mEq/L   Potassium 3.5 (*) 3.7 - 5.3 mEq/L   Chloride 106  96 - 112 mEq/L   CO2 23  19 - 32 mEq/L   Glucose, Bld 101 (*) 70 - 99 mg/dL   BUN 30 (*) 6 -  23 mg/dL   Creatinine, Ser 1.33  0.50 - 1.35 mg/dL   Calcium 8.6  8.4 - 10.5 mg/dL   Total Protein 5.4 (*) 6.0 - 8.3 g/dL   Albumin 2.9 (*) 3.5 - 5.2 g/dL   AST 11  0 - 37 U/L   ALT 11  0 - 53 U/L   Alkaline Phosphatase 44  39 - 117 U/L   Total Bilirubin <0.2 (*) 0.3 - 1.2 mg/dL   GFR calc non Af Amer 52 (*) >90 mL/min   GFR calc Af Amer 60 (*) >90 mL/min   Comment: (NOTE)     The eGFR has been calculated using the CKD EPI equation.     This calculation has not been validated in all clinical situations.     eGFR's persistently <90 mL/min signify possible Chronic Kidney     Disease.  PROTIME-INR     Status: Abnormal   Collection Time    04/11/13 10:00 AM      Result Value Ref Range   Prothrombin Time 25.1 (*) 11.6 - 15.2 seconds   INR 2.37 (*) 0.00 - 1.49  APTT     Status: None   Collection Time    04/11/13 10:00 AM      Result Value Ref Range   aPTT 32  24 - 37 seconds  TYPE AND SCREEN     Status: None   Collection Time    04/11/13 10:00 AM      Result Value Ref Range   ABO/RH(D) B POS     Antibody Screen NEG     Sample Expiration 04/14/2013     Unit Number [id number]     Blood Component Type RED CELLS,LR     Unit division 00     Status of Unit ISSUED     Transfusion Status OK TO TRANSFUSE     Crossmatch Result Compatible     Unit Number [id number]     Blood Component Type RED CELLS,LR     Unit division 00     Status of Unit ALLOCATED     Transfusion Status OK TO TRANSFUSE     Crossmatch Result Compatible    ABO/RH     Status: None   Collection Time    04/11/13 10:00 AM      Result Value Ref Range   ABO/RH(D) B POS    PREPARE  RBC (CROSSMATCH)     Status: None   Collection Time    04/11/13 10:00 AM      Result Value Ref Range   Order Confirmation ORDER PROCESSED BY BLOOD BANK     No results found.  Assessment: Likely upper GI bleed exacerbated by use of NSAIDs and anticoagulants suspect peptic ulcer disease Plan:  In addition to supportive care and  proton pump inhibitor and transfusion will proceed with EGD later this afternoon. If negative will need colonoscopy. Eythan Jayne C 04/11/2013, 2:19 PM

## 2013-04-11 NOTE — H&P (Signed)
[facility] History and Physical  DWON SKY KVQ:[id number] DOB: [birth year] DOA: 04/11/2013  Referring physician: Dr. Karlton Lemon PCP:  Melinda Crutch, MD   Chief Complaint: melena  HPI: [name] is a 73 y.o. male  Past medical diastolic heart failure with an EF of 55% on ASA, also Addison's disease currently on steroids, for a  DVT diagnosed 03/12/2013, once relative that comes in for melanotic stools are started one week prior to admission. His previous hemoglobin was 13 he relates about 3-4 bowel movements a day all black urine he started feeling lightheadedness and dizziness with exertion. Some shortness of breath but no chest pain. He relates some abdominal discomfort denies been taking Goody powder for 5 times a day.   In ED: The CBC was done that showed a hemoglobin of 7, (previous hemoglobin was 13 on December of last year), a white count of 13 with an ANC of 8.2, he was typed and crossed 2 units rectal exam was done that showed melanotic stool.   Review of Systems:  Constitutional:  No weight loss, night sweats, Fevers, chills, fatigue.  HEENT:  No headaches, Difficulty swallowing,Tooth/dental problems,Sore throat,  No sneezing, itching, ear ache, nasal congestion, post nasal drip,  Cardio-vascular:  No chest pain, Orthopnea, PND, swelling in lower extremities, anasarca, dizziness, palpitations  Resp:  No shortness of breath with exertion or at rest. No excess mucus, no productive cough, No non-productive cough, No coughing up of blood.No change in color of mucus.No wheezing.No chest wall deformity  Skin:  no rash or lesions.  GU:  no dysuria, change in color of urine, no urgency or frequency. No flank pain.  Musculoskeletal:  No joint pain or swelling. No decreased range of motion. No back pain.  Psych:  No change in mood or affect. No depression or anxiety. No memory loss.   Past Medical History  Diagnosis Date  . CHF (congestive heart failure)     His  initial ejection fraction was between 30 and 35%. His most recent echocardiogram performed in March of 2012 reveals normal left ventricular systolic function with an EF of 60-65%.  . Thyroid disease     addisons  . Pulmonary HTN   . Hypertension   . Addison's disease   . Hypogonadism male   . Renal insufficiency   . History of aortic insufficiency   . SOB (shortness of breath)   . Hx of cardiovascular stress test     ETT-Myoview 7/14:  Low risk, inferior defect consistent with thinning, no ischemia, normal wall motion, EF 54%  . DVT (deep venous thrombosis)    Past Surgical History  Procedure Laterality Date  . Cholecystectomy    . Cardiac catheterization  06/26/2008    EF 30-35%  . US echocardiography  04/09/2010    EF 60-65%   Social History:  reports that he has never smoked. He does not have any smokeless tobacco history on file. He reports that he does not drink alcohol or use illicit drugs.  No Known Allergies  History reviewed. No pertinent family history.   Prior to Admission medications   Medication Sig Start Date End Date Taking? Authorizing Provider  acetaminophen (TYLENOL) 500 MG tablet Take 1,000 mg by mouth daily.   Yes Historical Provider, MD  amLODipine (NORVASC) 5 MG tablet Take 2.5 mg by mouth daily.  12/05/12  Yes Historical Provider, MD  ANDROGEL PUMP 20.25 MG/ACT (1.62%) GEL daily. Apply 1 pump two times daily topically to shoulders. 12/08/12  Yes  Historical Provider, MD  aspirin 81 MG tablet Take 81 mg by mouth daily.    Yes Historical Provider, MD  carvedilol (COREG) 25 MG tablet Take 25 mg by mouth 2 (two) times daily with a meal.   Yes Historical Provider, MD  Cholecalciferol (VITAMIN D3 PO) Take 1 tablet by mouth daily.    Yes Historical Provider, MD  cyclobenzaprine (FLEXERIL) 10 MG tablet Take 10 mg by mouth 3 (three) times daily as needed for muscle spasms.   Yes Historical Provider, MD  furosemide (LASIX) 40 MG tablet Take 40 mg by mouth daily. 12/29/12   Yes Burtis Junes, NP  hydrocortisone (CORTEF) 20 MG tablet Take 20 mg by mouth 2 (two) times daily.    Yes Historical Provider, MD  lisinopril (PRINIVIL,ZESTRIL) 10 MG tablet Take 10 mg by mouth daily.   Yes Historical Provider, MD  pantoprazole (PROTONIX) 40 MG tablet Take 40 mg by mouth daily.   Yes Historical Provider, MD  promethazine (PHENERGAN) 25 MG tablet Take 25 mg by mouth daily as needed for nausea.    Yes Historical Provider, MD  thyroid (ARMOUR) 60 MG tablet Take 60 mg by mouth daily.    Yes Historical Provider, MD  traMADol (ULTRAM) 50 MG tablet Take 100 mg by mouth 3 (three) times daily. 100 mg in the morning, 50 mg in the afternoon, and 100 mg at bedtime.   Yes Historical Provider, MD  traMADol (ULTRAM) 50 MG tablet Take 100 mg by mouth at bedtime.   Yes Historical Provider, MD  XARELTO 20 MG TABS tablet Take 20 mg by mouth every morning. 03/29/13  Yes Historical Provider, MD   Physical Exam: Filed Vitals:   04/11/13 1120  BP: 108/76  Pulse: 82  Temp:   Resp: 16    BP 108/76  Pulse 82  Temp(Src) 98.4 F (36.9 C) (Oral)  Resp 16  Ht 5\' 9"  (1.753 m)  Wt 92.08 kg (203 lb)  BMI 29.96 kg/m2  SpO2 99%  General:  Appears calm and comfortable Eyes: PERRL, normal lids, irises & conjunctiva ENT: grossly normal hearing, lips & tongue Neck: no LAD, masses or thyromegaly Cardiovascular: RRR, no m/r/g. No LE edema. Telemetry: SR, no arrhythmias  Respiratory: CTA bilaterally, no w/r/r. Normal respiratory effort. Abdomen: soft, ntnd Skin: no rash or induration seen on limited exam Musculoskeletal: grossly normal tone BUE/BLE Psychiatric: grossly normal mood and affect, speech fluent and appropriate Neurologic: grossly non-focal.          Labs on Admission:  Basic Metabolic Panel:  Recent Labs Lab 04/11/13 1000  NA 142  K 3.5*  CL 106  CO2 23  GLUCOSE 101*  BUN 30*  CREATININE 1.33  CALCIUM 8.6   Liver Function Tests:  Recent Labs Lab 04/11/13 1000  AST  11  ALT 11  ALKPHOS 44  BILITOT <0.2*  PROT 5.4*  ALBUMIN 2.9*   No results found for this basename: LIPASE, AMYLASE,  in the last 168 hours No results found for this basename: AMMONIA,  in the last 168 hours CBC:  Recent Labs Lab 04/11/13 1000  WBC 12.9*  NEUTROABS 8.2*  HGB 7.0*  HCT 22.4*  MCV 92.9  PLT 285   Cardiac Enzymes: No results found for this basename: CKTOTAL, CKMB, CKMBINDEX, TROPONINI,  in the last 168 hours  BNP (last 3 results)  Recent Labs  08/08/12 0930 12/29/12 0913  PROBNP 38.0 80.0   CBG: No results found for this basename: GLUCAP,  in the last  168 hours  Radiological Exams on Admission: No results found.  EKG: Independently reviewed. none  Assessment/Plan  Acute upper GI bleeding/ Acute blood loss anemia: - Concern for an upper GI bleed, as he was taking Goody powders and ASA, felt some nausea but no epigastric pain to exam. We'll get 2 units of packed red blood cells, prothrombin complex, GI consulted for possible endoscopy. We'll number protonic IV twice a day. We'll place him n.p.o. Place 2 large this idea given a liter saline, knowing that he has diastolic heart failure.  HYPOTHYROIDISM: - Continue Synthroid.  Addison disease: - Continue steroids and Florinef.   Code Status: full Family Communication: none Disposition Plan: inpatient  Time spent: 80 minutes  Charlynne Cousins [facility] Pager [phone]

## 2013-04-11 NOTE — ED Provider Notes (Signed)
CSN: [id number]     Arrival date & time 04/11/13  0911 History   First MD Initiated Contact with Patient 04/11/13 0915     Chief Complaint  Patient presents with  . GI Bleeding     (Consider location/radiation/quality/duration/timing/severity/associated sxs/prior Treatment) HPI Pt with history of CHF recently diagnosed with thrombus in superficial saphenous vein and started on Xarelto. He reports black stools for the last several days with occasional mild upper abdominal discomfort. He has been feeling weak and dizzy at times. He states some mild exertional SOB but no CP. No leg swelling. No syncope.   Past Medical History  Diagnosis Date  . CHF (congestive heart failure)     His initial ejection fraction was between 30 and 35%. His most recent echocardiogram performed in March of 2012 reveals normal left ventricular systolic function with an EF of 60-65%.  . Thyroid disease     addisons  . Pulmonary HTN   . Hypertension   . Addison's disease   . Hypogonadism male   . Renal insufficiency   . History of aortic insufficiency   . SOB (shortness of breath)   . Hx of cardiovascular stress test     ETT-Myoview 7/14:  Low risk, inferior defect consistent with thinning, no ischemia, normal wall motion, EF 54%  . DVT (deep venous thrombosis)    Past Surgical History  Procedure Laterality Date  . Cholecystectomy    . Cardiac catheterization  06/26/2008    EF 30-35%  . US echocardiography  04/09/2010    EF 60-65%   History reviewed. No pertinent family history. History  Substance Use Topics  . Smoking status: Never Smoker   . Smokeless tobacco: Not on file  . Alcohol Use: No    Review of Systems  All other systems reviewed and are negative except as noted in HPI.    Allergies  Review of patient's allergies indicates no known allergies.  Home Medications   Current Outpatient Rx  Name  Route  Sig  Dispense  Refill  . amLODipine (NORVASC) 5 MG tablet   Oral   Take 2.5 mg by  mouth daily.          . ANDROGEL PUMP 20.25 MG/ACT (1.62%) GEL      daily.          Marland Kitchen aspirin 81 MG tablet   Oral   Take 81 mg by mouth daily.           . carvedilol (COREG) 25 MG tablet      TAKE 1 TABLET TWICE A DAY WITH MEALS   180 tablet   3   . Cholecalciferol (VITAMIN D3 PO)   Oral   Take by mouth.           . cyclobenzaprine (FLEXERIL) 10 MG tablet   Oral   Take 10 mg by mouth 3 (three) times daily as needed for muscle spasms.         . furosemide (LASIX) 40 MG tablet   Oral   Take 1 tablet (40 mg total) by mouth daily.   30 tablet   6   . hydrocortisone (CORTEF) 20 MG tablet   Oral   Take 20 mg by mouth 2 (two) times daily.          Marland Kitchen lisinopril (PRINIVIL,ZESTRIL) 10 MG tablet      TAKE 1 TABLET DAILY   90 tablet   0   . omeprazole (PRILOSEC) 20 MG capsule   Oral  Take 20 mg by mouth daily.           . potassium chloride (K-DUR) 10 MEQ tablet   Oral   Take 1 tablet (10 mEq total) by mouth daily.   30 tablet   6   . promethazine (PHENERGAN) 25 MG tablet   Oral   Take 25 mg by mouth every 6 (six) hours as needed.           . thyroid (ARMOUR) 60 MG tablet   Oral   Take 60 mg by mouth daily.           . traMADol (ULTRAM) 50 MG tablet   Oral   Take 50 mg by mouth every 6 (six) hours as needed.           . traZODone (DESYREL) 100 MG tablet   Oral   Take 50 mg by mouth at bedtime.           BP 115/98  Temp(Src) 98.4 F (36.9 C) (Oral)  Resp 18  Ht 5\' 9"  (1.753 m)  Wt 203 lb (92.08 kg)  BMI 29.96 kg/m2  SpO2 100% Physical Exam  Nursing note and vitals reviewed. Constitutional: He is oriented to person, place, and time. He appears well-developed and well-nourished.  HENT:  Head: Normocephalic and atraumatic.  Eyes: EOM are normal. Pupils are equal, round, and reactive to light.  Neck: Normal range of motion. Neck supple.  Cardiovascular: Normal rate, normal heart sounds and intact distal pulses.    Pulmonary/Chest: Effort normal and breath sounds normal.  Abdominal: Bowel sounds are normal. He exhibits no distension. There is no tenderness.  Genitourinary: Guaiac positive stool.  Rectal:  no hemorrhoids, no masses, no tenderness, stool black  Musculoskeletal: Normal range of motion. He exhibits no edema and no tenderness.  Neurological: He is alert and oriented to person, place, and time. He has normal strength. No cranial nerve deficit or sensory deficit.  Skin: Skin is warm and dry. No rash noted.  Psychiatric: He has a normal mood and affect.    ED Course  Procedures (including critical care time) Labs Review Labs Reviewed  CBC WITH DIFFERENTIAL - Abnormal; Notable for the following:    WBC 12.9 (*)    RBC 2.41 (*)    Hemoglobin 7.0 (*)    HCT 22.4 (*)    Neutro Abs 8.2 (*)    Monocytes Absolute 1.1 (*)    All other components within normal limits  COMPREHENSIVE METABOLIC PANEL - Abnormal; Notable for the following:    Potassium 3.5 (*)    Glucose, Bld 101 (*)    BUN 30 (*)    Total Protein 5.4 (*)    Albumin 2.9 (*)    Total Bilirubin <0.2 (*)    GFR calc non Af Amer 52 (*)    GFR calc Af Amer 60 (*)    All other components within normal limits  PROTIME-INR - Abnormal; Notable for the following:    Prothrombin Time 25.1 (*)    INR 2.37 (*)    All other components within normal limits  POC OCCULT BLOOD, ED - Abnormal; Notable for the following:    Fecal Occult Bld POSITIVE (*)    All other components within normal limits  APTT  TYPE AND SCREEN  ABO/RH   Imaging Review No results found.   EKG Interpretation None      MDM   Final diagnoses:  GI bleeding  Anemia due to blood loss  Coagulopathy  Pt with anemia and heme positive stools on Xarelto. HR and BP stable for now. Pharmacy called to help with Xarelto reversal. Spoke with Dr. Amedeo Plenty on call for [facility]. Awaiting call back from Hospitalist to admit.    Charles B. Karle Starch, MD 04/13/13  [phone]

## 2013-04-12 ENCOUNTER — Encounter (HOSPITAL_COMMUNITY): Payer: Self-pay | Admitting: Gastroenterology

## 2013-04-12 DIAGNOSIS — K922 Gastrointestinal hemorrhage, unspecified: Secondary | ICD-10-CM

## 2013-04-12 DIAGNOSIS — K279 Peptic ulcer, site unspecified, unspecified as acute or chronic, without hemorrhage or perforation: Secondary | ICD-10-CM | POA: Diagnosis present

## 2013-04-12 DIAGNOSIS — D500 Iron deficiency anemia secondary to blood loss (chronic): Secondary | ICD-10-CM

## 2013-04-12 LAB — CBC WITH DIFFERENTIAL/PLATELET
BASOS ABS: 0 10*3/uL (ref 0.0–0.1)
Basophils Relative: 0 % (ref 0–1)
EOS ABS: 0.2 10*3/uL (ref 0.0–0.7)
EOS PCT: 2 % (ref 0–5)
HCT: 28.1 % — ABNORMAL LOW (ref 39.0–52.0)
Hemoglobin: 9.4 g/dL — ABNORMAL LOW (ref 13.0–17.0)
LYMPHS ABS: 3.1 10*3/uL (ref 0.7–4.0)
Lymphocytes Relative: 23 % (ref 12–46)
MCH: 29.8 pg (ref 26.0–34.0)
MCHC: 33.5 g/dL (ref 30.0–36.0)
MCV: 89.2 fL (ref 78.0–100.0)
Monocytes Absolute: 1.2 10*3/uL — ABNORMAL HIGH (ref 0.1–1.0)
Monocytes Relative: 9 % (ref 3–12)
Neutro Abs: 9.1 10*3/uL — ABNORMAL HIGH (ref 1.7–7.7)
Neutrophils Relative %: 66 % (ref 43–77)
Platelets: 258 10*3/uL (ref 150–400)
RBC: 3.15 MIL/uL — ABNORMAL LOW (ref 4.22–5.81)
RDW: 16.3 % — AB (ref 11.5–15.5)
WBC: 13.7 10*3/uL — ABNORMAL HIGH (ref 4.0–10.5)

## 2013-04-12 LAB — COMPREHENSIVE METABOLIC PANEL
ALBUMIN: 3.2 g/dL — AB (ref 3.5–5.2)
ALT: 12 U/L (ref 0–53)
AST: 14 U/L (ref 0–37)
Alkaline Phosphatase: 52 U/L (ref 39–117)
BUN: 22 mg/dL (ref 6–23)
CO2: 21 mEq/L (ref 19–32)
Calcium: 8.7 mg/dL (ref 8.4–10.5)
Chloride: 103 mEq/L (ref 96–112)
Creatinine, Ser: 1.12 mg/dL (ref 0.50–1.35)
GFR calc Af Amer: 74 mL/min — ABNORMAL LOW (ref >90)
GFR calc non Af Amer: 64 mL/min — ABNORMAL LOW (ref >90)
Glucose, Bld: 95 mg/dL (ref 70–99)
Potassium: 3.8 mEq/L (ref 3.7–5.3)
SODIUM: 140 meq/L (ref 137–147)
TOTAL PROTEIN: 6.1 g/dL (ref 6.0–8.3)
Total Bilirubin: 0.4 mg/dL (ref 0.3–1.2)

## 2013-04-12 LAB — CBC
HEMATOCRIT: 30 % — AB (ref 39.0–52.0)
HEMOGLOBIN: 10 g/dL — AB (ref 13.0–17.0)
MCH: 29.8 pg (ref 26.0–34.0)
MCHC: 33.3 g/dL (ref 30.0–36.0)
MCV: 89.3 fL (ref 78.0–100.0)
Platelets: 285 10*3/uL (ref 150–400)
RBC: 3.36 MIL/uL — ABNORMAL LOW (ref 4.22–5.81)
RDW: 16.4 % — ABNORMAL HIGH (ref 11.5–15.5)
WBC: 14.2 10*3/uL — ABNORMAL HIGH (ref 4.0–10.5)

## 2013-04-12 LAB — TYPE AND SCREEN
ABO/RH(D): B POS
Antibody Screen: NEGATIVE
Unit division: 0
Unit division: 0

## 2013-04-12 LAB — CLOTEST (H. PYLORI), BIOPSY: HELICOBACTER SCREEN: NEGATIVE

## 2013-04-12 MED ORDER — PANTOPRAZOLE SODIUM 40 MG PO TBEC: 40.0000 mg | DELAYED_RELEASE_TABLET | Freq: Two times a day (BID) | ORAL | Status: DC

## 2013-04-12 MED ORDER — POLYETHYLENE GLYCOL 3350 17 GM/SCOOP PO POWD: 17.0000 g | Freq: Once | ORAL | Status: DC

## 2013-04-12 MED ORDER — RIVAROXABAN 20 MG PO TABS: 20.0000 mg | ORAL_TABLET | Freq: Every morning | ORAL | Status: DC

## 2013-04-12 MED ORDER — POLYSACCHARIDE IRON COMPLEX 150 MG PO CAPS: 150.0000 mg | ORAL_CAPSULE | Freq: Every day | ORAL | Status: DC

## 2013-04-12 MED ORDER — ASPIRIN 81 MG PO TABS: 81.0000 mg | ORAL_TABLET | Freq: Every day | ORAL | Status: DC

## 2013-04-12 NOTE — Discharge Summary (Signed)
Addendum  Patient seen and examined, chart and data base reviewed.  I agree with the above assessment and plan.  For full details please see Mrs. Imogene Burn PA note.  Upper GI bleed secondary to NSAIDs related gastric ulcers, Protonix twice a day.  Follow up with Dr. Amedeo Plenty of [facility] for results of the biopsies and colonoscopy.   Birdie Hopes, MD [facility] Pager: [phone] 04/12/2013, 1:31 PM

## 2013-04-12 NOTE — Discharge Instructions (Signed)
ASPIRIN AND XERALTO HAVE BEEN TEMPORARILY DISCONTINUED UNTIL YOU SEE DR. ROSS EARLY NEXT WEEK.    You may not need Xeralto at the point.  Your clot was in a superficial vein (the left greater saphenous) rather than a deep vein. Please reassess this with Dr. Harrington Challenger.

## 2013-04-12 NOTE — Discharge Summary (Signed)
Physician Discharge Summary  [name] [id number] DOB: 01-29-1940 DOA: 04/11/2013  PCP:  Dennis Crutch, MD  Admit date: 04/11/2013 Discharge date: 04/12/2013  Time spent: 40 minutes  Recommendations for Outpatient Follow-up:  1. Follow-up with PCP on Monday, March 23- Repeat CBC, discontinue aspirin unless strong indication, hold Xarelto until follow-up appointment and PCP determines that it is necessary.   2. Begin taking Niferex 150 mg q day. This should not cause as much constipation as other iron supplements but also prescribing Glycolax if needed. 3. Continue taking Protonix BID q 1 month and then decrease to q day starting the second month. 4. H. Pylori biopsy is pending and patient is over due for colonoscopy.  Follow up with Dr. Amedeo Gates on 4/1 at 9:30.  Discharge Diagnoses:  Principal Problem:   Acute lower GI bleeding Active Problems:   HYPOTHYROIDISM   Addison disease   Acute blood loss anemia   Acute GI bleeding   Peptic ulcer disease   Discharge Condition: stable  Diet recommendation: heart healthy   History of present illness:  [name] is a 73 year old male with a PMH of CHF, Addisons Disease, Thyroid diease, Hepatitis, and Pulmonary HTN, who admitted after having 1 week of melanotic stools, lightheadedness and dizziness with exertion. His baseline Hgb was 13 and on admission it was 7. He reported some abdominal discomfort.  He has been taking an 81 mg aspirin and multiple BC powders daily.  He was also started on Xeralto in February.  Hospital Course:  Acute GI bleeding/Acute blood loss anemia - Pt reported 1 week history of melanotic stools and chronic use of Goody powders. - He was started on Xeralto in 02/2013 for an extensive clot in the left saphenous vein. - CBC showed Hgb of 7, down from 13 in December 2014. - GI was consulted and Dr. Amedeo Gates performed upper endoscopy on 04/11/2013.  Three small antral ulcers were visualized. - He was transfused 2  units PRBCs, started on PPI, and Xarelto was held. - 3/19 Hgb is stable at 10 and will most likely continue to increase. Helicobacter biopsy is pending.   - He has had no further stools since his endoscopic procedure. - Pt was counseled on discontinuing use of Goody Powders and other NSAIDs.   - Aspirin and [name] are on hold until 3/23 when he can discuss their use with his PCP.  HYPOTHYROIDISM -Stable while inpatient. -Treated with Synthroid.  Addison disease - Stable while inpatient. - treated with at home steroid regimen and Florinef.    CHF - Stable while inpatient. - Continued on lasix, lisinopril and coreg.  Procedures:  Upper endoscopy 04/11/2013  Consultations:  G.I.  Discharge Exam: Filed Vitals:   04/12/13 0353  BP: 162/82  Pulse: 88  Temp: 98.8 F (37.1 C)  Resp: 18   Filed Weights   04/11/13 0917 04/11/13 1629  Weight: 92.08 kg (203 lb) 92.806 kg (204 lb 9.6 oz)     General: AAO x 3, very pleasant, NAD Cardiovascular: RRR, no murmors, gallops, rubs. Respiratory: CTA bilaterally, no wheezes, rhonchi, rales. Equal chest rise b/l. Abdomen: soft, nontender, nondistended. Skin: no rash Extremities: warm to touch, distal pulses intact. Able to move all four extremities.  Discharge Instructions      Discharge Orders   Future Orders Complete By Expires   Diet - low sodium heart healthy  As directed    Increase activity slowly  As directed        Medication List  STOP taking these medications       aspirin 81 MG tablet     XARELTO 20 MG Tabs tablet  Generic drug:  Rivaroxaban      TAKE these medications       acetaminophen 500 MG tablet  Commonly known as:  TYLENOL  Take 1,000 mg by mouth daily.     amLODipine 5 MG tablet  Commonly known as:  NORVASC  Take 2.5 mg by mouth daily.     ANDROGEL PUMP 20.25 MG/ACT (1.62%) Gel  Generic drug:  Testosterone  daily. Apply 1 pump two times daily topically to shoulders.     carvedilol 25  MG tablet  Commonly known as:  COREG  Take 25 mg by mouth 2 (two) times daily with a meal.     cyclobenzaprine 10 MG tablet  Commonly known as:  FLEXERIL  Take 10 mg by mouth 3 (three) times daily as needed for muscle spasms.     furosemide 40 MG tablet  Commonly known as:  LASIX  Take 40 mg by mouth daily.     hydrocortisone 20 MG tablet  Commonly known as:  CORTEF  Take 20 mg by mouth 2 (two) times daily.     iron polysaccharides 150 MG capsule  Commonly known as:  NIFEREX  Take 1 capsule (150 mg total) by mouth daily.     lisinopril 10 MG tablet  Commonly known as:  PRINIVIL,ZESTRIL  Take 10 mg by mouth daily.     pantoprazole 40 MG tablet  Commonly known as:  PROTONIX  Take 1 tablet (40 mg total) by mouth 2 (two) times daily. TAKE TWICE A DAY FOR 30 DAYS.  THEN RETURN TO ONCE A DAY     polyethylene glycol powder powder  Commonly known as:  GLYCOLAX  Take 17 g by mouth once. Take 17 g, 1 capful daily in 4 ounces of liquid (1/2 cup).  Take daily unless you have diarrhea     promethazine 25 MG tablet  Commonly known as:  PHENERGAN  Take 25 mg by mouth daily as needed for nausea.     thyroid 60 MG tablet  Commonly known as:  ARMOUR  Take 60 mg by mouth daily.     traMADol 50 MG tablet  Commonly known as:  ULTRAM  Take 100 mg by mouth 3 (three) times daily. 100 mg in the morning, 50 mg in the afternoon, and 100 mg at bedtime.     VITAMIN D3 PO  Take 1 tablet by mouth daily.       No Known Allergies Follow-up Information   Follow up with  Dennis Crutch, MD In 4 days.   Specialty:  Family Medicine   Contact information:   [address] [place] [address] [phone]       Follow up with Dennis Sabins, MD On 04/25/2013. (9:30 am to see Dr. Amedeo Gates.)    Specialty:  Gastroenterology   Contact information:   [address] [place] [address] [phone]       Follow up with  Dennis Crutch, MD On 04/16/2013. (12:30 to see Dr. Harrington Gates)     Specialty:  Family Medicine   Contact information:   [address] [place] [address] [phone]        The results of significant diagnostics from this hospitalization (including imaging, microbiology, ancillary and laboratory) are listed below for reference.      Labs: Basic Metabolic Panel:  Recent Labs  Lab 04/11/13 1000 04/12/13 0845  NA 142 140  K 3.5* 3.8  CL 106 103  CO2 23 21  GLUCOSE 101* 95  BUN 30* 22  CREATININE 1.33 1.12  CALCIUM 8.6 8.7   Liver Function Tests:  Recent Labs Lab 04/11/13 1000 04/12/13 0845  AST 11 14  ALT 11 12  ALKPHOS 44 52  BILITOT <0.2* 0.4  PROT 5.4* 6.1  ALBUMIN 2.9* 3.2*   CBC:  Recent Labs Lab 04/11/13 1000 04/12/13 0845 04/12/13 1107  WBC 12.9* 14.2* 13.7*  NEUTROABS 8.2*  --  9.1*  HGB 7.0* 10.0* 9.4*  HCT 22.4* 30.0* 28.1*  MCV 92.9 89.3 89.2  PLT 285 285 258    BNP (last 3 results)  Recent Labs  08/08/12 0930 12/29/12 0913  PROBNP 38.0 80.0       Signed:  [address], [place], [place] [phone] [facility] 04/12/2013, 12:25 PM

## 2013-04-12 NOTE — Progress Notes (Signed)
Utilization review completed.  

## 2013-04-12 NOTE — Progress Notes (Signed)
Pt given discharge instructions.  PIV removed.  Pt escorted downstairs to drop off location.

## 2013-04-16 ENCOUNTER — Ambulatory Visit (HOSPITAL_COMMUNITY)
Admission: RE | Admit: 2013-04-16 | Discharge: 2013-04-16 | Disposition: A | Discharge status: Home / Self Care | Payer: BC Managed Care – PPO | Source: Ambulatory Visit | Attending: Family Medicine | Admitting: Family Medicine

## 2013-04-16 DIAGNOSIS — D62 Acute posthemorrhagic anemia: Secondary | ICD-10-CM | POA: Insufficient documentation

## 2013-04-16 LAB — ABO/RH: ABO/RH(D): B POS

## 2013-04-17 ENCOUNTER — Other Ambulatory Visit (HOSPITAL_COMMUNITY): Payer: Self-pay | Admitting: Family Medicine

## 2013-04-17 ENCOUNTER — Encounter (HOSPITAL_COMMUNITY): Payer: Self-pay

## 2013-04-17 ENCOUNTER — Ambulatory Visit (HOSPITAL_COMMUNITY)
Admission: RE | Admit: 2013-04-17 | Discharge: 2013-04-17 | Disposition: A | Discharge status: Home / Self Care | Payer: BC Managed Care – PPO | Source: Ambulatory Visit | Attending: Family Medicine | Admitting: Family Medicine

## 2013-04-17 DIAGNOSIS — D62 Acute posthemorrhagic anemia: Secondary | ICD-10-CM | POA: Insufficient documentation

## 2013-04-17 DIAGNOSIS — K254 Chronic or unspecified gastric ulcer with hemorrhage: Secondary | ICD-10-CM | POA: Insufficient documentation

## 2013-04-17 LAB — PREPARE RBC (CROSSMATCH)

## 2013-04-17 MED ORDER — FUROSEMIDE 10 MG/ML IJ SOLN
20.0000 mg | Freq: Once | INTRAMUSCULAR | Status: AC
  Administered 2013-04-17: 20 mg via INTRAVENOUS
  Filled 2013-04-17: qty 2

## 2013-04-17 MED ORDER — SODIUM CHLORIDE 0.9 % IV SOLN
Freq: Once | INTRAVENOUS | Status: AC
  Administered 2013-04-17: 250 mL via INTRAVENOUS

## 2013-04-17 NOTE — Progress Notes (Signed)
Uneventful infusion of 2 units PRBC with lasix 20 mg IV between units. Pt discharged via w/c by staff to meet wife at car

## 2013-04-17 NOTE — Discharge Instructions (Signed)
Blood Transfusion Information WHAT IS A BLOOD TRANSFUSION? A transfusion is the replacement of blood or some of its parts. Blood is made up of multiple cells which provide different functions.  Red blood cells carry oxygen and are used for blood loss replacement.  White blood cells fight against infection.  Platelets control bleeding.  Plasma helps clot blood.  Other blood products are available for specialized needs, such as hemophilia or other clotting disorders. BEFORE THE TRANSFUSION  Who gives blood for transfusions?   You may be able to donate blood to be used at a later date on yourself (autologous donation).  Relatives can be asked to donate blood. This is generally not any safer than if you have received blood from a stranger. The same precautions are taken to ensure safety when a relative's blood is donated.  Healthy volunteers who are fully evaluated to make sure their blood is safe. This is blood bank blood. Transfusion therapy is the safest it has ever been in the practice of medicine. Before blood is taken from a donor, a complete history is taken to make sure that person has no history of diseases nor engages in risky social behavior (examples are intravenous drug use or sexual activity with multiple partners). The donor's travel history is screened to minimize risk of transmitting infections, such as malaria. The donated blood is tested for signs of infectious diseases, such as HIV and hepatitis. The blood is then tested to be sure it is compatible with you in order to minimize the chance of a transfusion reaction. If you or a relative donates blood, this is often done in anticipation of surgery and is not appropriate for emergency situations. It takes many days to process the donated blood. RISKS AND COMPLICATIONS Although transfusion therapy is very safe and saves many lives, the main dangers of transfusion include:   Getting an infectious disease.  Developing a  transfusion reaction. This is an allergic reaction to something in the blood you were given. Every precaution is taken to prevent this. The decision to have a blood transfusion has been considered carefully by your caregiver before blood is given. Blood is not given unless the benefits outweigh the risks. AFTER THE TRANSFUSION  Right after receiving a blood transfusion, you will usually feel much better and more energetic. This is especially true if your red blood cells have gotten low (anemic). The transfusion raises the level of the red blood cells which carry oxygen, and this usually causes an energy increase.  The nurse administering the transfusion will monitor you carefully for complications. HOME CARE INSTRUCTIONS  No special instructions are needed after a transfusion. You may find your energy is better. Speak with your caregiver about any limitations on activity for underlying diseases you may have. SEEK MEDICAL CARE IF:   Your condition is not improving after your transfusion.  You develop redness or irritation at the intravenous (IV) site. SEEK IMMEDIATE MEDICAL CARE IF:  Any of the following symptoms occur over the next 12 hours:  Shaking chills.  You have a temperature by mouth above 102 F (38.9 C), not controlled by medicine.  Chest, back, or muscle pain.  People around you feel you are not acting correctly or are confused.  Shortness of breath or difficulty breathing.  Dizziness and fainting.  You get a rash or develop hives.  You have a decrease in urine output.  Your urine turns a dark color or changes to pink, red, or brown. Any of the following   symptoms occur over the next 10 days:  You have a temperature by mouth above 102 F (38.9 C), not controlled by medicine.  Shortness of breath.  Weakness after normal activity.  The white part of the eye turns yellow (jaundice).  You have a decrease in the amount of urine or are urinating less often.  Your  urine turns a dark color or changes to pink, red, or brown. Document Released: 01/09/2000 Document Revised: 04/05/2011 Document Reviewed: 08/28/2007 ExitCare Patient Information 2014 [employer].  

## 2013-04-18 LAB — TYPE AND SCREEN
ABO/RH(D): B POS
Antibody Screen: NEGATIVE
Unit division: 0
Unit division: 0

## 2013-04-27 ENCOUNTER — Other Ambulatory Visit: Payer: Self-pay | Admitting: Cardiovascular Disease

## 2013-04-30 ENCOUNTER — Telehealth: Payer: Self-pay | Admitting: Cardiovascular Disease

## 2013-04-30 MED ORDER — POTASSIUM CHLORIDE CRYS ER 10 MEQ PO TBCR: 10.0000 meq | EXTENDED_RELEASE_TABLET | Freq: Once | ORAL | Status: DC

## 2013-04-30 NOTE — Telephone Encounter (Signed)
New Message  Pt wife called states that the pt's Feet and ankles are swollen.Lonna Duval that this is a sign of internal bleeding.. Made an appt w/ NP Truitt Merle.. Pt requests a call back to discuss

## 2013-04-30 NOTE — Telephone Encounter (Signed)
C/o SOB, 3+pitting leg edema, per wife he is avoiding all forms of sodium. Pt has colonoscopy 05/02/13 due to anemia, has had 5 units PRBC's. 04/11/13 and 04/17/13. Pt is having a hard time laying down and elevates with pillows. 04/12/13 BUN 22,  creat 1.12, all  in normal range. Has app 05/04/13 w/ Cecille Rubin. Please advise.

## 2013-04-30 NOTE — Telephone Encounter (Signed)
Spoke with Truitt Merle NP, pt to take lasix and K+ twice daily x 3 days. Pt has app here Friday and will have f/u labs. Wife verbalized understanding.

## 2013-05-02 ENCOUNTER — Other Ambulatory Visit: Payer: Self-pay | Admitting: *Deleted

## 2013-05-02 MED ORDER — POTASSIUM CHLORIDE CRYS ER 10 MEQ PO TBCR: 10.0000 meq | EXTENDED_RELEASE_TABLET | Freq: Once | ORAL | Status: DC

## 2013-05-11 ENCOUNTER — Encounter: Payer: Self-pay | Admitting: Nurse Practitioner

## 2013-05-11 ENCOUNTER — Other Ambulatory Visit: Payer: Self-pay | Admitting: *Deleted

## 2013-05-11 ENCOUNTER — Other Ambulatory Visit: Payer: Self-pay | Admitting: Nurse Practitioner

## 2013-05-11 ENCOUNTER — Ambulatory Visit
Admission: RE | Admit: 2013-05-11 | Discharge: 2013-05-11 | Disposition: A | Discharge status: Home / Self Care | Payer: BC Managed Care – PPO | Source: Ambulatory Visit | Attending: Nurse Practitioner | Admitting: Nurse Practitioner

## 2013-05-11 ENCOUNTER — Ambulatory Visit (INDEPENDENT_AMBULATORY_CARE_PROVIDER_SITE_OTHER): Payer: BC Managed Care – PPO | Admitting: Nurse Practitioner

## 2013-05-11 ENCOUNTER — Encounter: Payer: Self-pay | Admitting: Cardiology

## 2013-05-11 ENCOUNTER — Ambulatory Visit (HOSPITAL_COMMUNITY): Payer: BC Managed Care – PPO | Attending: Cardiology | Admitting: *Deleted

## 2013-05-11 VITALS — BP 120/78 | HR 78 | Ht 69.0 in | Wt 203.8 lb

## 2013-05-11 DIAGNOSIS — R0600 Dyspnea, unspecified: Secondary | ICD-10-CM

## 2013-05-11 DIAGNOSIS — I10 Essential (primary) hypertension: Secondary | ICD-10-CM

## 2013-05-11 DIAGNOSIS — R609 Edema, unspecified: Secondary | ICD-10-CM

## 2013-05-11 DIAGNOSIS — K922 Gastrointestinal hemorrhage, unspecified: Secondary | ICD-10-CM

## 2013-05-11 DIAGNOSIS — I8000 Phlebitis and thrombophlebitis of superficial vessels of unspecified lower extremity: Secondary | ICD-10-CM

## 2013-05-11 DIAGNOSIS — I428 Other cardiomyopathies: Secondary | ICD-10-CM | POA: Insufficient documentation

## 2013-05-11 DIAGNOSIS — R0989 Other specified symptoms and signs involving the circulatory and respiratory systems: Secondary | ICD-10-CM | POA: Insufficient documentation

## 2013-05-11 DIAGNOSIS — R0609 Other forms of dyspnea: Secondary | ICD-10-CM | POA: Insufficient documentation

## 2013-05-11 DIAGNOSIS — R0602 Shortness of breath: Secondary | ICD-10-CM

## 2013-05-11 LAB — BRAIN NATRIURETIC PEPTIDE: Pro B Natriuretic peptide (BNP): 95 pg/mL (ref 0.0–100.0)

## 2013-05-11 LAB — CBC
HCT: 30 % — ABNORMAL LOW (ref 39.0–52.0)
Hemoglobin: 9.8 g/dL — ABNORMAL LOW (ref 13.0–17.0)
MCHC: 32.7 g/dL (ref 30.0–36.0)
MCV: 85.1 fl (ref 78.0–100.0)
Platelets: 302 10*3/uL (ref 150.0–400.0)
RBC: 3.53 Mil/uL — ABNORMAL LOW (ref 4.22–5.81)
RDW: 16.1 % — ABNORMAL HIGH (ref 11.5–14.6)
WBC: 12.5 10*3/uL — ABNORMAL HIGH (ref 4.5–10.5)

## 2013-05-11 LAB — BASIC METABOLIC PANEL
BUN: 25 mg/dL — ABNORMAL HIGH (ref 6–23)
CO2: 27 mEq/L (ref 19–32)
Calcium: 9.1 mg/dL (ref 8.4–10.5)
Chloride: 105 mEq/L (ref 96–112)
Creatinine, Ser: 1.6 mg/dL — ABNORMAL HIGH (ref 0.4–1.5)
GFR: 47.04 mL/min — ABNORMAL LOW (ref >60.00)
Glucose, Bld: 97 mg/dL (ref 70–99)
Potassium: 3.8 mEq/L (ref 3.5–5.1)
Sodium: 141 mEq/L (ref 135–145)

## 2013-05-11 MED ORDER — FUROSEMIDE 40 MG PO TABS: 40.0000 mg | ORAL_TABLET | Freq: Two times a day (BID) | ORAL | Status: DC

## 2013-05-11 NOTE — Patient Instructions (Addendum)
We need to check venous doppler today  We need an echocardiogram early next week  Increase the Lasix to 40 mg two times a day  We will check lab today  I need to see you in one week - please keep this visit  Please go to [facility] [place] on the first floor for a chest Xray - you may walk in.

## 2013-05-11 NOTE — Progress Notes (Signed)
Lower extremity venous duplex complete

## 2013-05-11 NOTE — Progress Notes (Signed)
[name] Date of Birth: 10-Sep-1940 Medical Record #[id number]  History of Present Illness: [name] is seen back today for what was to be a 6 week check. Last seen in December 2014. Seen for [name]. He has a NICM with systolic HF since 7124, secondary pulmonary HTN, nonobstructive CAD per cath in 2010, Addison's disease, hypothyroidism, hypogonadism, HTN and CKD. Ef was down to 30 to 35% but last echo from June of 2013 showed improvement to 55 to 60%.   Was seen back here in July of 2014 by Richardson Dopp, PA - was to have been seen back in just a couple of weeks - did not followed thru. He reported being more short of breath at that time. Myoview was carried out. Referral to pulmonary was mentioned.   Called back in October of 2014 with swelling - lasix was added - continued to miss follow up visits.   I saw him back in December of 2014 with the same complaint - short of breath with exertion. I added back his diuretic and potassium. Ordered an echo but he did not follow thru. He was to have been seen back 6 weeks later.  Since he was last here, he had a GI bleed in March - was transfused. Review of the chart notes that he had been placed on Xarelto in February - for what looks like for DVT on the left. Was taking aspirin and multiple BC powders in addition to the Xarelto.   Comes back today. Here with his wife. They are here today because "they had missed so many appointments and he is feeling so bad". Has more swelling in his legs - more in the right than in the left - DVT was in the left. His legs do not hurt. He remains short of breath. No CXR noted from his recent admission. Weight is up and down. ?Salt use. No chest pain. No cough. He is fatigued. He tells me his blood count is not improving. He has no active bleeding reported. He wants to know what is going to be done about his blood clot. Says no one has told them anything. Sounds like there was no explanation for his DVT. No  prior travel/long car trips, etc.   Current Outpatient Prescriptions  Medication Sig Dispense Refill  . acetaminophen (TYLENOL) 500 MG tablet Take 1,000 mg by mouth daily.      Marland Kitchen amLODipine (NORVASC) 5 MG tablet Take 5 mg by mouth daily.       . carvedilol (COREG) 25 MG tablet Take 25 mg by mouth 2 (two) times daily with a meal.      . Cholecalciferol (VITAMIN D3 PO) Take 1 tablet by mouth daily.       . furosemide (LASIX) 40 MG tablet Take 40 mg by mouth daily.      . hydrocortisone (CORTEF) 20 MG tablet Take 20 mg by mouth 3 (three) times daily.       . iron polysaccharides (NIFEREX) 150 MG capsule Take 1 capsule (150 mg total) by mouth daily.  30 capsule  2  . lisinopril (PRINIVIL,ZESTRIL) 10 MG tablet TAKE 1 TABLET DAILY  90 tablet  0  . pantoprazole (PROTONIX) 40 MG tablet Take 1 tablet (40 mg total) by mouth 2 (two) times daily. TAKE TWICE A DAY FOR 30 DAYS.  THEN RETURN TO ONCE A DAY  60 tablet  0  . potassium chloride (K-DUR,KLOR-CON) 10 MEQ tablet Take 1 tablet (10 mEq total) by  mouth once.  90 tablet  3  . promethazine (PHENERGAN) 25 MG tablet Take 25 mg by mouth daily as needed for nausea.       . sucralfate (CARAFATE) 1 G tablet Take 1 g by mouth 2 (two) times daily.       Marland Kitchen thyroid (ARMOUR) 60 MG tablet Take 60 mg by mouth daily.       . traMADol (ULTRAM) 50 MG tablet Take 100 mg by mouth 3 (three) times daily. 100 mg in the morning, 50 mg in the afternoon, and 100 mg at bedtime.       No current facility-administered medications for this visit.    No Known Allergies  Past Medical History  Diagnosis Date  . CHF (congestive heart failure)     His initial ejection fraction was between 30 and 35%. His most recent echocardiogram performed in March of 2012 reveals normal left ventricular systolic function with an EF of 60-65%.  . Thyroid disease     addisons  . Pulmonary HTN   . Hypertension   . Addison's disease   . Hypogonadism male   . Renal insufficiency   . History of  aortic insufficiency   . SOB (shortness of breath)   . Hx of cardiovascular stress test     ETT-Myoview 7/14:  Low risk, inferior defect consistent with thinning, no ischemia, normal wall motion, EF 54%  . DVT (deep venous thrombosis) ~ 02/2013    BLE  . Family history of anesthesia complication     "brother wakes up in the middle of OR" (04/11/2013)  . GERD (gastroesophageal reflux disease)   . H/O hiatal hernia   . Hepatitis 1959    "think it was a form of C"   . Daily headache   . Chronic back pain     "neck to tailbone" (04/11/2013)  . Peptic ulcer disease     suspected 04/11/2013  . On home oxygen therapy     "2-3L @ night" (04/11/2013)  . Acute lower GI bleeding     admitted to [facility] 04/11/2013    Past Surgical History  Procedure Laterality Date  . Cardiac catheterization  06/26/2008    EF 30-35%  . US echocardiography  04/09/2010    EF 60-65%  . Tonsillectomy  1940's  . Appendectomy  1950's  . Cholecystectomy  ~ 1965  . Esophagogastroduodenoscopy N/A 04/11/2013    Procedure: ESOPHAGOGASTRODUODENOSCOPY (EGD);  Surgeon: Missy Sabins, MD;  Location: [facility] ENDOSCOPY;  Service: Endoscopy;  Laterality: N/A;    History  Smoking status  . Never Smoker   Smokeless tobacco  . Never Used    History  Alcohol Use No    History reviewed. No pertinent family history.  Review of Systems: The review of systems is per the HPI.  All other systems were reviewed and are negative.  Physical Exam: BP 120/78  Pulse 78  Ht 5\' 9"  (1.753 m)  Wt 203 lb 12.8 oz (92.443 kg)  BMI 30.08 kg/m2  SpO2 98% Patient is alert and in no acute distress. Skin is warm and dry. Color is normal.  HEENT is unremarkable - voice is hoarse. Normocephalic/atraumatic. PERRL. Sclera are nonicteric. Neck is supple. No masses. No JVD. Lungs are clear. Cardiac exam shows a regular rate and rhythm. Abdomen is soft. Extremities are with 1+ edema on the right, trace on the left. Gait and ROM are intact. No gross neurologic  deficits noted.  Wt Readings from Last 3 Encounters:  05/11/13  203 lb 12.8 oz (92.443 kg)  04/17/13 209 lb (94.802 kg)  04/11/13 204 lb 9.6 oz (92.806 kg)     LABORATORY DATA: Lab Results  Component Value Date   WBC 13.7* 04/12/2013   HGB 9.4* 04/12/2013   HCT 28.1* 04/12/2013   PLT 258 04/12/2013   GLUCOSE 95 04/12/2013   CHOL 180 05/05/2010   TRIG 128.0 05/05/2010   HDL 31.30* 05/05/2010   LDLCALC 123* 05/05/2010   ALT 12 04/12/2013   AST 14 04/12/2013   NA 140 04/12/2013   K 3.8 04/12/2013   CL 103 04/12/2013   CREATININE 1.12 04/12/2013   BUN 22 04/12/2013   CO2 21 04/12/2013   TSH 5.218 Test methodology is 3rd generation TSH 06/21/2008   INR 2.37* 04/11/2013   HGBA1C  Value: 6.1 (NOTE) The ADA recommends the following therapeutic goal for glycemic control related to Hgb A1c measurement: Goal of therapy: <6.5 Hgb A1c  Reference: American Diabetes Association: Clinical Practice Recommendations 2010, Diabetes Care, 2010, 33: (Suppl  1). 06/21/2008   Venous Doppler from 03/12/13 Summary:  - Findings consistent with superficial vein thrombosis involving the left greater saphenous vein. - No evidence of deep vein thrombosis involving the left lower extremity. - No evidence of deep vein thrombosis involving the right common femoral vein. Other specific details can be found in the table(s) above. Prepared and Electronically Authenticated by  Adele Barthel 2015-02-17T11:45:29.740   Assessment / Plan: 1. Recent GI bleed from aspirin, xarelto and BC powders - several ulcers noted on EGD - he tells me his counts are not improving - sees GI again next week.   2. NICM - Chronic systolic HF - last echo from 2013 had shown improvement - he did not follow up for repeat study - continues to have dyspnea and edema - needs to get the echo and check labs today. Lasix increased to 40 mg BID. Send for CXR as well.   3. Nonobstructive CAD - negative Myoview from 2014.   4. Prior DVT - 2 months ago but  in the left superficial greater saphenous vein - no longer on anticoagulation due to GI bleed - will recheck doppler - this is unchanged   Discussed with Dr. Marlou Porch (DOD) - he is in agreement with my plan of increasing the Lasix, get echo and check labs with early follow up. Need to see back next week.   Review of his EPIC chart shows CTA of the chest from 5 years ago that was negative for PE but with mild dilatation of his aorta. This will need to be discussed on return.   Patient is agreeable to this plan and will call if any problems develop in the interim.   Burtis Junes, RN, [place] [address] [place], [place]  [address] [phone]

## 2013-05-18 ENCOUNTER — Ambulatory Visit (INDEPENDENT_AMBULATORY_CARE_PROVIDER_SITE_OTHER): Payer: BC Managed Care – PPO | Admitting: Nurse Practitioner

## 2013-05-18 ENCOUNTER — Encounter: Payer: Self-pay | Admitting: Nurse Practitioner

## 2013-05-18 ENCOUNTER — Ambulatory Visit (HOSPITAL_COMMUNITY)
Admission: RE | Admit: 2013-05-18 | Discharge: 2013-05-18 | Disposition: A | Discharge status: Home / Self Care | Payer: BC Managed Care – PPO | Source: Ambulatory Visit | Attending: Cardiology | Admitting: Cardiology

## 2013-05-18 VITALS — BP 129/77 | HR 92 | Ht 69.0 in | Wt 204.0 lb

## 2013-05-18 DIAGNOSIS — R0600 Dyspnea, unspecified: Secondary | ICD-10-CM

## 2013-05-18 DIAGNOSIS — R0609 Other forms of dyspnea: Secondary | ICD-10-CM

## 2013-05-18 DIAGNOSIS — R0602 Shortness of breath: Secondary | ICD-10-CM

## 2013-05-18 DIAGNOSIS — I10 Essential (primary) hypertension: Secondary | ICD-10-CM

## 2013-05-18 DIAGNOSIS — I359 Nonrheumatic aortic valve disorder, unspecified: Secondary | ICD-10-CM

## 2013-05-18 DIAGNOSIS — R609 Edema, unspecified: Secondary | ICD-10-CM

## 2013-05-18 DIAGNOSIS — I428 Other cardiomyopathies: Secondary | ICD-10-CM

## 2013-05-18 DIAGNOSIS — K922 Gastrointestinal hemorrhage, unspecified: Secondary | ICD-10-CM

## 2013-05-18 DIAGNOSIS — R0989 Other specified symptoms and signs involving the circulatory and respiratory systems: Secondary | ICD-10-CM

## 2013-05-18 LAB — BASIC METABOLIC PANEL
BUN: 29 mg/dL — ABNORMAL HIGH (ref 6–23)
CO2: 25 mEq/L (ref 19–32)
Calcium: 9.4 mg/dL (ref 8.4–10.5)
Chloride: 106 mEq/L (ref 96–112)
Creatinine, Ser: 1.8 mg/dL — ABNORMAL HIGH (ref 0.4–1.5)
GFR: 39.08 mL/min — ABNORMAL LOW (ref >60.00)
Glucose, Bld: 97 mg/dL (ref 70–99)
Potassium: 3.1 mEq/L — ABNORMAL LOW (ref 3.5–5.1)
Sodium: 140 mEq/L (ref 135–145)

## 2013-05-18 LAB — CBC
HCT: 30.8 % — ABNORMAL LOW (ref 39.0–52.0)
Hemoglobin: 9.8 g/dL — ABNORMAL LOW (ref 13.0–17.0)
MCHC: 31.6 g/dL (ref 30.0–36.0)
MCV: 85.1 fl (ref 78.0–100.0)
Platelets: 339 10*3/uL (ref 150.0–400.0)
RBC: 3.62 Mil/uL — ABNORMAL LOW (ref 4.22–5.81)
RDW: 15.8 % — ABNORMAL HIGH (ref 11.5–14.6)
WBC: 13 10*3/uL — ABNORMAL HIGH (ref 4.5–10.5)

## 2013-05-18 MED ORDER — FUROSEMIDE 40 MG PO TABS: 40.0000 mg | ORAL_TABLET | Freq: Every day | ORAL | Status: DC

## 2013-05-18 NOTE — Progress Notes (Signed)
[name] Date of Birth: [birth year] Medical Record [id number]  History of Present Illness: [name] is seen back today for a one week check. Seen for [name]. He has a NICM with systolic HF since [id number], secondary pulmonary HTN, nonobstructive CAD per cath in 2010, Addison's disease, hypothyroidism, hypogonadism, HTN and CKD. Ef was down to 30 to 35% but last echo from June of 2013 showed improvement to 55 to 60%.   Was seen back here in July of 2014 by Richardson Dopp, PA - was to have been seen back in just a couple of weeks - did not followed thru. He reported being more short of breath at that time. Myoview was carried out. Referral to pulmonary was mentioned.   Called back in October of 2014 with swelling - lasix was added - continued to miss follow up visits.   I saw him back in December of 2014 with the same complaint - short of breath with exertion. I added back his diuretic and potassium. Ordered an echo but he did not follow thru. He was to have been seen back 6 weeks later.   Since he was last here, he had a GI bleed in March - was transfused. Review of the chart notes that he had been placed on Xarelto in February - for what looks like for DVT on the left. Was taking aspirin and multiple BC powders in addition to the Xarelto.   Seen last week with multiple issues - and because "they had missed so many appointments and he is feeling so bad". Had more swelling in his legs - more in the right than in the left - DVT was in the left. He remained short of breath. No CXR noted from his recent admission. Weight up and down. ?Salt use. No chest pain. No cough. He is fatigued. He tells me his blood count is not improving. He has no active bleeding reported. He wants to know what is going to be done about his blood clot. Says no one has told them anything. Sounded like there was no explanation for his DVT - turned out to be superficial on our recheck.   Comes in today. Here with his  wife. He is doing better.  His swelling is gone. Legs feel fine. Remains short of breath. Still with raspy voice - has been this way over 10 years. Little dizzy yesterday. No chest pain. No bleeding reported. Sees GI next week.   Current Outpatient Prescriptions  Medication Sig Dispense Refill  . acetaminophen (TYLENOL) 500 MG tablet Take 1,000 mg by mouth daily.      Marland Kitchen amLODipine (NORVASC) 5 MG tablet Take 5 mg by mouth daily.       . carvedilol (COREG) 25 MG tablet Take 25 mg by mouth 2 (two) times daily with a meal.      . Cholecalciferol (VITAMIN D3 PO) Take 1 tablet by mouth daily.       . furosemide (LASIX) 40 MG tablet Take 1 tablet (40 mg total) by mouth 2 (two) times daily.  30 tablet    . hydrocortisone (CORTEF) 20 MG tablet Take 20 mg by mouth 3 (three) times daily.       . iron polysaccharides (NIFEREX) 150 MG capsule Take 1 capsule (150 mg total) by mouth daily.  30 capsule  2  . lisinopril (PRINIVIL,ZESTRIL) 10 MG tablet TAKE 1 TABLET DAILY  90 tablet  0  . pantoprazole (PROTONIX) 40 MG tablet TAKE TWICE A  DAY FOR 30 DAYS.  THEN RETURN TO ONCE A DAY      . potassium chloride (K-DUR,KLOR-CON) 10 MEQ tablet Take 1 tablet (10 mEq total) by mouth once.  90 tablet  3  . promethazine (PHENERGAN) 25 MG tablet Take 25 mg by mouth daily as needed for nausea.       . sucralfate (CARAFATE) 1 G tablet Take 1 g by mouth 2 (two) times daily.       Marland Kitchen thyroid (ARMOUR) 60 MG tablet Take 60 mg by mouth daily.       . traMADol (ULTRAM) 50 MG tablet Take 100 mg by mouth 3 (three) times daily. 100 mg in the morning, 50 mg in the afternoon, and 100 mg at bedtime.       No current facility-administered medications for this visit.    No Known Allergies  Past Medical History  Diagnosis Date  . CHF (congestive heart failure)     His initial ejection fraction was between 30 and 35%. His most recent echocardiogram performed in March of 2012 reveals normal left ventricular systolic function with an EF of  60-65%.  . Thyroid disease     addisons  . Pulmonary HTN   . Hypertension   . Addison's disease   . Hypogonadism male   . Renal insufficiency   . History of aortic insufficiency   . SOB (shortness of breath)   . Hx of cardiovascular stress test     ETT-Myoview 7/14:  Low risk, inferior defect consistent with thinning, no ischemia, normal wall motion, EF 54%  . DVT (deep venous thrombosis) ~ 02/2013    BLE  . Family history of anesthesia complication     "brother wakes up in the middle of OR" (04/11/2013)  . GERD (gastroesophageal reflux disease)   . H/O hiatal hernia   . Hepatitis 1959    "think it was a form of C"   . Daily headache   . Chronic back pain     "neck to tailbone" (04/11/2013)  . Peptic ulcer disease     suspected 04/11/2013  . On home oxygen therapy     "2-3L @ night" (04/11/2013)  . Acute lower GI bleeding     admitted to [facility] 04/11/2013    Past Surgical History  Procedure Laterality Date  . Cardiac catheterization  06/26/2008    EF 30-35%  . US echocardiography  04/09/2010    EF 60-65%  . Tonsillectomy  1940's  . Appendectomy  1950's  . Cholecystectomy  ~ 1965  . Esophagogastroduodenoscopy N/A 04/11/2013    Procedure: ESOPHAGOGASTRODUODENOSCOPY (EGD);  Surgeon: Missy Sabins, MD;  Location: [facility] ENDOSCOPY;  Service: Endoscopy;  Laterality: N/A;    History  Smoking status  . Never Smoker   Smokeless tobacco  . Never Used    History  Alcohol Use No    History reviewed. No pertinent family history.  Review of Systems: The review of systems is per the HPI.  All other systems were reviewed and are negative.  Physical Exam: BP 129/77  Pulse 92  Ht 5\' 9"  (1.753 m)  Wt 204 lb (92.534 kg)  BMI 30.11 kg/m2 Patient is very pleasant and in no acute distress. Skin is warm and dry. Color is normal.  HEENT is unremarkable. Normocephalic/atraumatic. PERRL. Sclera are nonicteric. Neck is supple. No masses. No JVD. Lungs are clear. Cardiac exam shows a regular rate  and rhythm. Abdomen is soft. Extremities are without edema. Gait and ROM are intact.  No gross neurologic deficits noted.  Wt Readings from Last 3 Encounters:  05/18/13 204 lb (92.534 kg)  05/11/13 203 lb 12.8 oz (92.443 kg)  04/17/13 209 lb (94.802 kg)     LABORATORY DATA: PENDING   Lab Results  Component Value Date   WBC 12.5* 05/11/2013   HGB 9.8* 05/11/2013   HCT 30.0* 05/11/2013   PLT 302.0 05/11/2013   GLUCOSE 97 05/11/2013   CHOL 180 05/05/2010   TRIG 128.0 05/05/2010   HDL 31.30* 05/05/2010   LDLCALC 123* 05/05/2010   ALT 12 04/12/2013   AST 14 04/12/2013   NA 141 05/11/2013   K 3.8 05/11/2013   CL 105 05/11/2013   CREATININE 1.6* 05/11/2013   BUN 25* 05/11/2013   CO2 27 05/11/2013   TSH 5.218 Test methodology is 3rd generation TSH 06/21/2008   INR 2.37* 04/11/2013   HGBA1C  Value: 6.1 (NOTE) The ADA recommends the following therapeutic goal for glycemic control related to Hgb A1c measurement: Goal of therapy: <6.5 Hgb A1c  Reference: American Diabetes Association: Clinical Practice Recommendations 2010, Diabetes Care, 2010, 33: (Suppl  1). 06/21/2008   Dg Chest 2 View  05/11/2013    IMPRESSION: Stable exam.  No active cardiopulmonary disease.   Electronically Signed   By: Earle Gell M.D.   On: 05/11/2013 16:56   Echo from 04/2013 Study Conclusions  - Left ventricle: The cavity size was normal. Systolic function was normal. The estimated ejection fraction was in the range of 50% to 55%. Wall motion was normal; there were no regional wall motion abnormalities. There was an increased relative contribution of atrial contraction to ventricular filling. Doppler parameters are consistent with abnormal left ventricular relaxation (grade 1 diastolic dysfunction). - Aortic valve: Mild to moderate regurgitation. - Aorta: Aortic root dimension: 90mm (ED). - Ascending aorta: The ascending aorta was mildly dilated. - Mitral valve: Valve area by pressure half-time: 2.08cm^2.  Venous Doppler  from 03/12/13 Summary:  - Findings consistent with superficial vein thrombosis involving the left greater saphenous vein. - No evidence of deep vein thrombosis involving the left lower extremity. - No evidence of deep vein thrombosis involving the right common femoral vein. Other specific details can be found in the table(s) above. Prepared and Electronically Authenticated by  Adele Barthel 2015-02-17T11:45:29.740    Assessment / Plan:  1. Recent GI bleed from aspirin, xarelto and BC powders - several ulcers noted on EGD -  sees GI again next week.   2. NICM - Chronic systolic HF - last echo from 2013 had shown improvement - most recent echo with normal LV function - this was reported to the patient. Lasix cut back to just one a day.   3. Nonobstructive CAD - negative Myoview from 2014. No active symptoms  4. Prior DVT - 2 months ago but in the left superficial greater saphenous vein - no longer on anticoagulation due to GI bleed - rechecked the doppler and this was found to be unchanged   5. Persistent dyspnea - will send to pulmonary - will need PFTs.   6. 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   See him back here in 6 months. Cut the lasix back. Recheck labs today.   Patient is agreeable to this plan and will call if any problems develop in the interim.   Burtis Junes, RN, [place]  [address]  [place], [place] [address]  [phone]

## 2013-05-18 NOTE — Progress Notes (Signed)
2D Echo Performed 05/18/2013    Secily Walthour, RCS  

## 2013-05-18 NOTE — Patient Instructions (Addendum)
We will recheck labs today  Cut your Lasix back to just one pill a day  We will refer you to pulmonary - Dr. Lamonte Sakai, Dr. Jacquelynn Cree or Dr. Lake Bells only  See [name] in 6 months  Call the [facility] office at [phone] if you have any questions, problems or concerns.

## 2013-05-21 ENCOUNTER — Encounter: Payer: Self-pay | Admitting: Nurse Practitioner

## 2013-05-21 ENCOUNTER — Telehealth: Payer: Self-pay | Admitting: Nurse Practitioner

## 2013-05-21 ENCOUNTER — Other Ambulatory Visit: Payer: Self-pay | Admitting: Nurse Practitioner

## 2013-05-21 DIAGNOSIS — I509 Heart failure, unspecified: Secondary | ICD-10-CM

## 2013-05-21 NOTE — Telephone Encounter (Signed)
New message ° ° ° °Returning Kim's call °

## 2013-05-21 NOTE — Telephone Encounter (Signed)
Pt aware of results 

## 2013-06-04 ENCOUNTER — Telehealth: Payer: Self-pay | Admitting: Cardiovascular Disease

## 2013-06-04 DIAGNOSIS — I82819 Embolism and thrombosis of superficial veins of unspecified lower extremities: Secondary | ICD-10-CM

## 2013-06-04 DIAGNOSIS — R609 Edema, unspecified: Secondary | ICD-10-CM

## 2013-06-04 NOTE — Telephone Encounter (Signed)
Needs support stockings.  May take an extra lasix for 2 days only  Really needs to restrict his salt.  Refer to [facility] for evaluation.   His follow up needs to be with [name].

## 2013-06-04 NOTE — Telephone Encounter (Signed)
Spoke with patient's wife and reviewed Truitt Merle, NP advice.  Wife reports healthy low sodium diet; reports patient has support stockings and she will advise him to put them back on.  Referral for VVS in epic and follow-up scheduled for [name]; patient will need bmet on that day as well. Patient scheduled for June 8 and I advised wife to call back with worsening symptoms in the meantime.  Patient's wife verbalized understanding and agreement.

## 2013-06-04 NOTE — Telephone Encounter (Signed)
New message     Pt has ankle,feet,toes swelling.  Still swollen after double fluid pills given.  Please advise

## 2013-06-04 NOTE — Telephone Encounter (Signed)
I have not seen this patient in 2 years - he has been seen by Cecille Rubin for this issue.  Venous Duplex was negative.  I will have to see him for any further advice.  Or please forward to [place] who may have a better idea of what is going on.

## 2013-06-04 NOTE — Telephone Encounter (Signed)
Spoke with patient's wife who states patient is having lower extremity swelling x approximately 1 week; states swelling is in toes, feet, ankles up to calves; Right larger than left but similar bilaterally.  Wife denies that patient has had SOB, chest discomfort.  Wife states that they were instructed at last ov to take 2 Lasix daily.  Cecille Rubin Gerhardt's notes state that patient was told to decrease Lasix to 40 mg daily, not to increase dose.  Wife reports patient has been taking Lasix 80 mg daily, including today.  When lab work was reviewed, Juanda Crumble, CMA called and advised patient to increase his potassium to 2 pills daily due to low potassium - wife states patient has done this.  Patient is due for repeat bmet today.  I advised wife that I will forward message to [name] for advice and will call her back.  Wife verbalized understanding and agreement.

## 2013-06-11 ENCOUNTER — Other Ambulatory Visit: Payer: Self-pay | Admitting: *Deleted

## 2013-06-11 DIAGNOSIS — I8000 Phlebitis and thrombophlebitis of superficial vessels of unspecified lower extremity: Secondary | ICD-10-CM

## 2013-06-14 ENCOUNTER — Other Ambulatory Visit: Payer: Self-pay

## 2013-06-14 MED ORDER — FUROSEMIDE 40 MG PO TABS: 40.0000 mg | ORAL_TABLET | Freq: Two times a day (BID) | ORAL | Status: DC

## 2013-06-14 MED ORDER — POTASSIUM CHLORIDE CRYS ER 10 MEQ PO TBCR: 10.0000 meq | EXTENDED_RELEASE_TABLET | Freq: Two times a day (BID) | ORAL | Status: DC

## 2013-06-15 ENCOUNTER — Other Ambulatory Visit: Payer: Self-pay | Admitting: *Deleted

## 2013-06-15 MED ORDER — POTASSIUM CHLORIDE CRYS ER 10 MEQ PO TBCR: 10.0000 meq | EXTENDED_RELEASE_TABLET | Freq: Two times a day (BID) | ORAL | Status: DC

## 2013-06-20 ENCOUNTER — Other Ambulatory Visit: Payer: Self-pay | Admitting: Internal Medicine

## 2013-06-20 ENCOUNTER — Encounter: Payer: Self-pay | Admitting: Emergency Medicine

## 2013-06-20 ENCOUNTER — Ambulatory Visit (INDEPENDENT_AMBULATORY_CARE_PROVIDER_SITE_OTHER): Payer: BC Managed Care – PPO | Admitting: Emergency Medicine

## 2013-06-20 VITALS — BP 158/90 | HR 76 | Temp 98.0°F | Ht 70.0 in | Wt 209.2 lb

## 2013-06-20 DIAGNOSIS — R0609 Other forms of dyspnea: Secondary | ICD-10-CM

## 2013-06-20 DIAGNOSIS — R0989 Other specified symptoms and signs involving the circulatory and respiratory systems: Principal | ICD-10-CM

## 2013-06-20 NOTE — Patient Instructions (Signed)
We will perform full pulmonary function testing  We will perform a ventilation-perfusion scan We will discuss with Dr Acie Fredrickson and L. Gerhardt, consider adjusting your blood pressure medication to help your hoarse voice.  Follow with Dr Lamonte Sakai next available to review the results

## 2013-06-20 NOTE — Progress Notes (Signed)
Subjective:    Patient ID: [name], male    DOB: [birth year], 73 y.o.   MRN: [id number]  HPI 73 yo man, never smoker, hx HTN, diastolic dysfxn, Addison's dz, B LE DVT, GERD / PUD / GIB with anemia (Hgb 9.4-10.0). Xarelto was stopped due to the GIB (finished 5-6 weeks). 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 Better on last TTE 4/'15). Referred today for exertional SOB. No hx asthma or childhood PNA's.   He is dyspneic with exertion, progressive over several years. He has a very hoarse voice, occasionally loses his voice. He feels a weakness in his legs when he carries groceries. Sometimes hears wheezing, doesn't cough. Denies CP. He is feels GERD sx, on protonix. He is on an ACE-i.    Review of Systems  Constitutional: Negative for fever and unexpected weight change.  HENT: Negative for congestion, dental problem, ear pain, nosebleeds, postnasal drip, rhinorrhea, sinus pressure, sneezing, sore throat and trouble swallowing.   Eyes: Negative for redness and itching.  Respiratory: Positive for shortness of breath and wheezing. Negative for cough and chest tightness.   Cardiovascular: Negative for palpitations and leg swelling.  Gastrointestinal: Negative for nausea and vomiting.  Genitourinary: Negative for dysuria.  Musculoskeletal: Negative for joint swelling.  Skin: Negative for rash.  Neurological: Negative for headaches.  Hematological: Does not bruise/bleed easily.  Psychiatric/Behavioral: Negative for dysphoric mood. The patient is not nervous/anxious.     Past Medical History  Diagnosis Date  . CHF (congestive heart failure)     His initial ejection fraction was between 30 and 35%. His most recent echocardiogram performed in March of 2012 reveals normal left ventricular systolic function with an EF of 60-65%.  . Thyroid disease     addisons  . Pulmonary HTN   . Hypertension   . Addison's disease   . Hypogonadism male   . Renal insufficiency   . History of  aortic insufficiency   . SOB (shortness of breath)   . Hx of cardiovascular stress test     ETT-Myoview 7/14:  Low risk, inferior defect consistent with thinning, no ischemia, normal wall motion, EF 54%  . DVT (deep venous thrombosis) ~ 02/2013    BLE  . Family history of anesthesia complication     "brother wakes up in the middle of OR" (04/11/2013)  . GERD (gastroesophageal reflux disease)   . H/O hiatal hernia   . Hepatitis 1959    "think it was a form of C"   . Daily headache   . Chronic back pain     "neck to tailbone" (04/11/2013)  . Peptic ulcer disease     suspected 04/11/2013  . On home oxygen therapy     "2-3L @ night" (04/11/2013)  . Acute lower GI bleeding     admitted to [facility] 04/11/2013     Family History  Problem Relation Age of Onset  . Clotting disorder Mother   . Clotting disorder Brother   . Clotting disorder Brother   . Clotting disorder Brother   . Clotting disorder Other     Niece.     History   Social History  . Marital Status: Married    Spouse Name: N/A    Number of Children: N/A  . Years of Education: N/A   Occupational History  . retired    Social History Main Topics  . Smoking status: Never Smoker   . Smokeless tobacco: Never Used  . Alcohol Use: No  .  Drug Use: No  . Sexual Activity: Not Currently   Other Topics Concern  . Not on file   Social History Narrative  . No narrative on file  He owns cats. Had birds as a youngster Has worked as Surveyor, quantity, water treatment Has lived in [place], [place], [place], [place]   No Known Allergies   Outpatient Prescriptions Prior to Visit  Medication Sig Dispense Refill  . acetaminophen (TYLENOL) 500 MG tablet Take 1,000 mg by mouth daily.      Marland Kitchen amLODipine (NORVASC) 5 MG tablet Take 5 mg by mouth daily.       . carvedilol (COREG) 25 MG tablet Take 25 mg by mouth 2 (two) times daily with a meal.      . Cholecalciferol (VITAMIN D3 PO) Take 1 tablet by mouth daily.       . furosemide (LASIX) 40 MG tablet  Take 1 tablet (40 mg total) by mouth 2 (two) times daily.  60 tablet  1  . hydrocortisone (CORTEF) 20 MG tablet Take 20 mg by mouth 3 (three) times daily.       . iron polysaccharides (NIFEREX) 150 MG capsule Take 1 capsule (150 mg total) by mouth daily.  30 capsule  2  . lisinopril (PRINIVIL,ZESTRIL) 10 MG tablet TAKE 1 TABLET DAILY  90 tablet  0  . pantoprazole (PROTONIX) 40 MG tablet TAKE TWICE A DAY FOR 30 DAYS.  THEN RETURN TO ONCE A DAY      . potassium chloride (K-DUR,KLOR-CON) 10 MEQ tablet Take 1 tablet (10 mEq total) by mouth 2 (two) times daily.  180 tablet  1  . promethazine (PHENERGAN) 25 MG tablet Take 25 mg by mouth daily as needed for nausea.       . sucralfate (CARAFATE) 1 G tablet Take 1 g by mouth 2 (two) times daily.       Marland Kitchen thyroid (ARMOUR) 60 MG tablet Take 60 mg by mouth daily.       . traMADol (ULTRAM) 50 MG tablet 100 mg in the morning, 50 mg in the afternoon, and 150 mg at bedtime.       No facility-administered medications prior to visit.          Objective:   Physical Exam Filed Vitals:   06/20/13 1340  BP: 158/90  Pulse: 76  Temp: 98 F (36.7 C)  TempSrc: Oral  Height: 5\' 10"  (1.778 m)  Weight: 209 lb 3.2 oz (94.892 kg)  SpO2: 96%   Gen: Pleasant, well-nourished, in no distress,  normal affect  ENT: No lesions,  mouth clear,  oropharynx clear, no postnasal drip  Neck: No JVD, no TMG, no carotid bruits  Lungs: No use of accessory muscles, no dullness to percussion, clear without rales or rhonchi  Cardiovascular: RRR, heart sounds normal, no murmur or gallops, no peripheral edema  Musculoskeletal: No deformities, no cyanosis or clubbing  Neuro: alert, non focal  Skin: Warm, no lesions or rashes   CXR 05/11/13 --  COMPARISON: 08/08/2012  FINDINGS:  The heart size and mediastinal contours are within normal limits.  Both lungs are clear. The visualized skeletal structures are  unremarkable.  IMPRESSION:  Stable exam. No active  cardiopulmonary disease.     Assessment & Plan:  DYSPNEA No tobacco hx. He has hoarse voice but no cough and no wheeze. Believe we need to eval for obstructive / restrictive disease. Also need to r/o PE since he has hx DVT and did not complete therapy.  -  V/q scan - full PFT - consider changing ACE-i to ARB, can discuss with Dr Acie Fredrickson and L. Servando Snare

## 2013-06-20 NOTE — Assessment & Plan Note (Addendum)
No tobacco hx. He has hoarse voice but no cough and no wheeze. Believe we need to eval for obstructive / restrictive disease. Also need to r/o PE since he has hx DVT and did not complete therapy.  - V/q scan - full PFT - consider changing ACE-i to ARB, can discuss with Dr Acie Fredrickson and L. Servando Snare

## 2013-06-22 ENCOUNTER — Ambulatory Visit (HOSPITAL_COMMUNITY)
Admission: RE | Admit: 2013-06-22 | Discharge: 2013-06-22 | Disposition: A | Discharge status: Home / Self Care | Payer: BC Managed Care – PPO | Source: Ambulatory Visit | Attending: Emergency Medicine | Admitting: Emergency Medicine

## 2013-06-22 ENCOUNTER — Encounter (HOSPITAL_COMMUNITY): Payer: Self-pay

## 2013-06-22 DIAGNOSIS — R0989 Other specified symptoms and signs involving the circulatory and respiratory systems: Principal | ICD-10-CM

## 2013-06-22 DIAGNOSIS — R0609 Other forms of dyspnea: Secondary | ICD-10-CM

## 2013-06-22 DIAGNOSIS — R0602 Shortness of breath: Secondary | ICD-10-CM | POA: Insufficient documentation

## 2013-06-22 DIAGNOSIS — M47814 Spondylosis without myelopathy or radiculopathy, thoracic region: Secondary | ICD-10-CM | POA: Insufficient documentation

## 2013-06-22 LAB — PULMONARY FUNCTION TEST
DL/VA % PRED: 66 %
DL/VA: 2.97 ml/min/mmHg/L
DLCO UNC: 17.49 ml/min/mmHg
DLCO unc % pred: 59 %
FEF 25-75 POST: 2.04 L/s
FEF 25-75 Pre: 1.73 L/sec
FEF2575-%CHANGE-POST: 17 %
FEF2575-%PRED-POST: 93 %
FEF2575-%PRED-PRE: 79 %
FEV1-%Change-Post: 4 %
FEV1-%PRED-PRE: 92 %
FEV1-%Pred-Post: 97 %
FEV1-POST: 2.82 L
FEV1-Pre: 2.7 L
FEV1FVC-%CHANGE-POST: 3 %
FEV1FVC-%Pred-Pre: 95 %
FEV6-%CHANGE-POST: 1 %
FEV6-%Pred-Post: 104 %
FEV6-%Pred-Pre: 102 %
FEV6-PRE: 3.86 L
FEV6-Post: 3.91 L
FEV6FVC-%Change-Post: 0 %
FEV6FVC-%PRED-POST: 105 %
FEV6FVC-%Pred-Pre: 105 %
FVC-%CHANGE-POST: 1 %
FVC-%Pred-Post: 98 %
FVC-%Pred-Pre: 97 %
FVC-Post: 3.93 L
FVC-Pre: 3.88 L
POST FEV1/FVC RATIO: 72 %
PRE FEV1/FVC RATIO: 70 %
PRE FEV6/FVC RATIO: 99 %
Post FEV6/FVC ratio: 100 %
RV % pred: 99 %
RV: 2.37 L
TLC % pred: 95 %
TLC: 6.3 L

## 2013-06-22 MED ORDER — TECHNETIUM TO 99M ALBUMIN AGGREGATED
5.5000 | Freq: Once | INTRAVENOUS | Status: AC | PRN
  Administered 2013-06-22: 5.5 via INTRAVENOUS

## 2013-06-22 MED ORDER — ALBUTEROL SULFATE (2.5 MG/3ML) 0.083% IN NEBU
2.5000 mg | INHALATION_SOLUTION | Freq: Once | RESPIRATORY_TRACT | Status: AC
  Administered 2013-06-22: 2.5 mg via RESPIRATORY_TRACT

## 2013-06-22 MED ORDER — TECHNETIUM TC 99M DIETHYLENETRIAME-PENTAACETIC ACID: 42.3000 | Freq: Once | INTRAVENOUS | Status: DC | PRN

## 2013-06-25 ENCOUNTER — Telehealth: Payer: Self-pay | Admitting: Cardiovascular Disease

## 2013-06-25 NOTE — Telephone Encounter (Signed)
Spoke with patient's wife who states someone from our office called patient and patient was unable to hear them; no message left.  I reviewed appointments and found note that next week's appointment needs to be rescheduled.  Appointment moved to June 3 and wife aware that patient also needs lab work on this day.  Wife verbalized understanding and gratitude.

## 2013-06-25 NOTE — Telephone Encounter (Signed)
New message ° ° ° ° ° °Returning a nurses call °

## 2013-06-27 ENCOUNTER — Encounter: Payer: Self-pay | Admitting: Emergency Medicine

## 2013-06-27 ENCOUNTER — Encounter: Payer: Self-pay | Admitting: Cardiovascular Disease

## 2013-06-27 ENCOUNTER — Encounter: Payer: Self-pay | Admitting: Vascular Surgery

## 2013-06-27 ENCOUNTER — Ambulatory Visit (INDEPENDENT_AMBULATORY_CARE_PROVIDER_SITE_OTHER): Payer: BC Managed Care – PPO | Admitting: Cardiovascular Disease

## 2013-06-27 ENCOUNTER — Other Ambulatory Visit: Payer: BC Managed Care – PPO

## 2013-06-27 ENCOUNTER — Ambulatory Visit (INDEPENDENT_AMBULATORY_CARE_PROVIDER_SITE_OTHER): Payer: BC Managed Care – PPO | Admitting: Emergency Medicine

## 2013-06-27 VITALS — BP 128/76 | HR 81 | Ht 69.0 in | Wt 209.0 lb

## 2013-06-27 VITALS — BP 130/82 | HR 80 | Ht 70.0 in | Wt 206.0 lb

## 2013-06-27 DIAGNOSIS — R490 Dysphonia: Secondary | ICD-10-CM

## 2013-06-27 DIAGNOSIS — R0989 Other specified symptoms and signs involving the circulatory and respiratory systems: Secondary | ICD-10-CM

## 2013-06-27 DIAGNOSIS — R0609 Other forms of dyspnea: Secondary | ICD-10-CM

## 2013-06-27 DIAGNOSIS — I5022 Chronic systolic (congestive) heart failure: Secondary | ICD-10-CM

## 2013-06-27 DIAGNOSIS — I509 Heart failure, unspecified: Secondary | ICD-10-CM

## 2013-06-27 MED ORDER — ALBUTEROL SULFATE HFA 108 (90 BASE) MCG/ACT IN AERS: 2.0000 | INHALATION_SPRAY | Freq: Four times a day (QID) | RESPIRATORY_TRACT | Status: DC | PRN

## 2013-06-27 NOTE — Patient Instructions (Signed)
Please start albuterol 2 puffs as needed for shortness of breath. You may want to take this 10 minutes before doing any exertion to see if it helps you.  Walking oximetry today We will refer you to see Dr Wilburn Cornelia with ENT to evaluate your hoarse voice.  Follow with Dr Lamonte Sakai in 2 months to discuss whether the medication has helped you

## 2013-06-27 NOTE — Assessment & Plan Note (Signed)
He is doing ok.  His EF has normalized.  He continues to have dyspnea.  At this point, I do not see any further cardiac medication adjustments needed. He had a normal Myoview study last year. His echocardiogram was basically unremarkable. He has normal left ventricular systolic function. He has mild valvular disease. His pulmonary artery pressures have normalized.  He still has a superficial venous thrombosis. He was not able to take anticoagulants because of a major GI bleed. He's wearing a compression hose. He'll need to continue to elevate his legs.  I'll see him again in one year.

## 2013-06-27 NOTE — Progress Notes (Signed)
[name] Date of Birth  [birth year]       [facility]    [employer] [address], [place], [place]  [address]   [place], [place]  [address] [phone]     [phone]   Fax  [phone]    Fax [phone]  Problem List: 1. 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 Congestive heart failure-EF of 30-35% by echo in 2010 3. Hypertension 4. Addison's disease-on chronic steroid therapy 5. Hypothyroidism 6. Mild adrenal insufficiency  History of Present Illness:  Jayant has doing well from a cardiac standpoint.  He made some adjustments to his Testosterone dose and ultimately had problems with his Addisons disease. His breathing continues to slowly improve.  June 27, 2013:  I last saw Mr. Tardif approximately 2 years ago. He's been seen by Cecille Rubin and by Richardson Dopp in the meantime. He's been little bit noncompliant and has not made it to all of his visits.  Echo 05/18/13: Left ventricle: The cavity size was normal. Systolic function was normal. The estimated ejection fraction was in the range of 50% to 55%. Wall motion was normal; there were no regional wall motion abnormalities. There was an increased relative contribution of atrial contraction to ventricular filling. Doppler parameters are consistent with abnormal left ventricular relaxation (grade 1 diastolic dysfunction). - Aortic valve: Mild to moderate regurgitation. - Aorta: Aortic root dimension: 18mm (ED). - Ascending aorta: The ascending aorta was mildly dilated   He's been diagnosed as having a superficial vein thrombosis.  He was on xarelto for a while but this was stopped when he had a GI bleed.  He's had a GI bleed in March, 2015. He required ransfusions.     . Current Outpatient Prescriptions on File Prior to Visit  Medication Sig Dispense Refill  . acetaminophen (TYLENOL) 500 MG tablet Take 1,000 mg by mouth daily.       Marland Kitchen amLODipine (NORVASC) 5 MG tablet Take 5 mg by mouth daily.       . carvedilol (COREG) 25 MG tablet Take 25 mg by mouth 2 (two) times daily with a meal.      . Cholecalciferol (VITAMIN D3 PO) Take 1 tablet by mouth daily.       . furosemide (LASIX) 40 MG tablet Take 1 tablet (40 mg total) by mouth 2 (two) times daily.  60 tablet  1  . hydrocortisone (CORTEF) 20 MG tablet Take 20 mg by mouth 3 (three) times daily.       . iron polysaccharides (NIFEREX) 150 MG capsule Take 1 capsule (150 mg total) by mouth daily.  30 capsule  2  . lisinopril (PRINIVIL,ZESTRIL) 10 MG tablet TAKE 1 TABLET DAILY  90 tablet  0  . pantoprazole (PROTONIX) 40 MG tablet TAKE TWICE A DAY FOR 30 DAYS.  THEN RETURN TO ONCE A DAY      . potassium chloride (K-DUR,KLOR-CON) 10 MEQ tablet Take 1 tablet (10 mEq total) by mouth 2 (two) times daily.  180 tablet  1  . promethazine (PHENERGAN) 25 MG tablet Take 25 mg by mouth daily as needed for nausea.       Marland Kitchen thyroid (ARMOUR) 60 MG tablet Take 60 mg by mouth daily.       . traMADol (ULTRAM) 50 MG tablet 100 mg in the morning, 50 mg in the afternoon, and 150 mg at bedtime.       No current  facility-administered medications on file prior to visit.    No Known Allergies  Past Medical History  Diagnosis Date  . CHF (congestive heart failure)     His initial ejection fraction was between 30 and 35%. His most recent echocardiogram performed in March of 2012 reveals normal left ventricular systolic function with an EF of 60-65%.  . Thyroid disease     addisons  . Pulmonary HTN   . Hypertension   . Addison's disease   . Hypogonadism male   . Renal insufficiency   . History of aortic insufficiency   . SOB (shortness of breath)   . Hx of cardiovascular stress test     ETT-Myoview 7/14:  Low risk, inferior defect consistent with thinning, no ischemia, normal wall motion, EF 54%  . DVT (deep venous thrombosis) ~ 02/2013    BLE  . Family history of anesthesia complication       "brother wakes up in the middle of OR" (04/11/2013)  . GERD (gastroesophageal reflux disease)   . H/O hiatal hernia   . Hepatitis 1959    "think it was a form of C"   . Daily headache   . Chronic back pain     "neck to tailbone" (04/11/2013)  . Peptic ulcer disease     suspected 04/11/2013  . On home oxygen therapy     "2-3L @ night" (04/11/2013)  . Acute lower GI bleeding     admitted to [facility] 04/11/2013    Past Surgical History  Procedure Laterality Date  . Cardiac catheterization  06/26/2008    EF 30-35%  . US echocardiography  04/09/2010    EF 60-65%  . Tonsillectomy  1940's  . Appendectomy  1950's  . Cholecystectomy  ~ 1965  . Esophagogastroduodenoscopy N/A 04/11/2013    Procedure: ESOPHAGOGASTRODUODENOSCOPY (EGD);  Surgeon: Missy Sabins, MD;  Location: [facility] ENDOSCOPY;  Service: Endoscopy;  Laterality: N/A;    History  Smoking status  . Never Smoker   Smokeless tobacco  . Never Used    History  Alcohol Use No    Family History  Problem Relation Age of Onset  . Clotting disorder Mother   . Clotting disorder Brother   . Clotting disorder Brother   . Clotting disorder Brother   . Clotting disorder Other     Niece.    Reviw of Systems:  Reviewed in the HPI.  All other systems are negative.  Physical Exam: Blood pressure 130/82, pulse 80, height 5\' 10"  (1.778 m), weight 206 lb (93.441 kg). General: Well developed, well nourished, in no acute distress.  Head: Normocephalic, atraumatic, sclera non-icteric, mucus membranes are moist,   Neck: Supple. Carotids are 2 + without bruits. No JVD  Lungs: Clear bilaterally to auscultation.  Heart: regular rate.  normal  S1 S2. No murmurs, gallops or rubs.  Abdomen: Soft, non-tender, non-distended with normal bowel sounds. No hepatomegaly. No rebound/guarding. No masses.  Msk:  Strength and tone are normal  Extremities: No clubbing or cyanosis. No edema.  Distal pedal pulses are 2+ and equal bilaterally.  Neuro: Alert  and oriented X 3. Moves all extremities spontaneously.  Psych:  Responds to questions appropriately with a normal affect.  ECG:  Assessment / Plan:

## 2013-06-27 NOTE — Progress Notes (Signed)
Subjective:    Patient ID: [name], male    DOB: 1940-07-21, 73 y.o.   MRN: [id number]  Shortness of Breath Associated symptoms include wheezing. Pertinent negatives include no ear pain, fever, headaches, leg swelling, rash, rhinorrhea, sore throat or vomiting.   73 yo man, never smoker, hx HTN, diastolic dysfxn, Addison's dz, B LE DVT, GERD / PUD / GIB with anemia (Hgb 9.4-10.0). Xarelto was stopped due to the GIB (finished 5-6 weeks). 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 Better on last TTE 4/'15). Referred today for exertional SOB. No hx asthma or childhood PNA's.   He is dyspneic with exertion, progressive over several years. He has a very hoarse voice, occasionally loses his voice. He feels a weakness in his legs when he carries groceries. Sometimes hears wheezing, doesn't cough. Denies CP. He is feels GERD sx, on protonix. He is on an ACE-i.   ROV 06/27/13 -- follows for dyspnea. He has had V/q > low prob for chronic PE, Full PFT done 06/22/13 >> mild AFL, normal volumes, decrease DLCO that corrects for Va.  His breathing is about the same. No cough but still has a hoarse voice.    Review of Systems  Constitutional: Negative for fever and unexpected weight change.  HENT: Negative for congestion, dental problem, ear pain, nosebleeds, postnasal drip, rhinorrhea, sinus pressure, sneezing, sore throat and trouble swallowing.   Eyes: Negative for redness and itching.  Respiratory: Positive for shortness of breath and wheezing. Negative for cough and chest tightness.   Cardiovascular: Negative for palpitations and leg swelling.  Gastrointestinal: Negative for nausea and vomiting.  Genitourinary: Negative for dysuria.  Musculoskeletal: Negative for joint swelling.  Skin: Negative for rash.  Neurological: Negative for headaches.  Hematological: Does not bruise/bleed easily.  Psychiatric/Behavioral: Negative for dysphoric mood. The patient is not nervous/anxious.     Past  Medical History  Diagnosis Date  . CHF (congestive heart failure)     His initial ejection fraction was between 30 and 35%. His most recent echocardiogram performed in March of 2012 reveals normal left ventricular systolic function with an EF of 60-65%.  . Thyroid disease     addisons  . Pulmonary HTN   . Hypertension   . Addison's disease   . Hypogonadism male   . Renal insufficiency   . History of aortic insufficiency   . SOB (shortness of breath)   . Hx of cardiovascular stress test     ETT-Myoview 7/14:  Low risk, inferior defect consistent with thinning, no ischemia, normal wall motion, EF 54%  . DVT (deep venous thrombosis) ~ 02/2013    BLE  . Family history of anesthesia complication     "brother wakes up in the middle of OR" (04/11/2013)  . GERD (gastroesophageal reflux disease)   . H/O hiatal hernia   . Hepatitis 1959    "think it was a form of C"   . Daily headache   . Chronic back pain     "neck to tailbone" (04/11/2013)  . Peptic ulcer disease     suspected 04/11/2013  . On home oxygen therapy     "2-3L @ night" (04/11/2013)  . Acute lower GI bleeding     admitted to [facility] 04/11/2013     Family History  Problem Relation Age of Onset  . Clotting disorder Mother   . Clotting disorder Brother   . Clotting disorder Brother   . Clotting disorder Brother   . Clotting disorder  Other     Niece.     History   Social History  . Marital Status: Married    Spouse Name: N/A    Number of Children: N/A  . Years of Education: N/A   Occupational History  . retired    Social History Main Topics  . Smoking status: Never Smoker   . Smokeless tobacco: Never Used  . Alcohol Use: No  . Drug Use: No  . Sexual Activity: Not Currently   Other Topics Concern  . Not on file   Social History Narrative  . No narrative on file  He owns cats. 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 acetaminophen (TYLENOL) 500 MG tablet Take 1,000 mg by mouth daily.      Marland Kitchen amitriptyline (ELAVIL) 50 MG tablet       . amLODipine (NORVASC) 5 MG tablet Take 5 mg by mouth daily.       . Cholecalciferol (VITAMIN D3 PO) Take 1 tablet by mouth daily.       . hydrocortisone (CORTEF) 20 MG tablet Take 20 mg by mouth 3 (three) times daily.       . iron polysaccharides (NIFEREX) 150 MG capsule Take 1 capsule (150 mg total) by mouth daily.  30 capsule  2  . pantoprazole (PROTONIX) 40 MG tablet TAKE TWICE A DAY FOR 30 DAYS.  THEN RETURN TO ONCE A DAY      . promethazine (PHENERGAN) 25 MG tablet Take 25 mg by mouth daily as needed for nausea.       Marland Kitchen thyroid (ARMOUR) 60 MG tablet Take 60 mg by mouth daily.       . traMADol (ULTRAM) 50 MG tablet 100 mg in the morning, 50 mg in the afternoon, and 150 mg at bedtime.      . carvedilol (COREG) 25 MG tablet Take 25 mg by mouth 2 (two) times daily with a meal.      . furosemide (LASIX) 40 MG tablet Take 1 tablet (40 mg total) by mouth 2 (two) times daily.  60 tablet  1  . lisinopril (PRINIVIL,ZESTRIL) 10 MG tablet TAKE 1 TABLET DAILY  90 tablet  0  . potassium chloride (K-DUR,KLOR-CON) 10 MEQ tablet Take 1 tablet (10 mEq total) by mouth 2 (two) times daily.  180 tablet  1   No facility-administered medications prior to visit.          Objective:   Physical Exam Filed Vitals:   06/27/13 1432 06/27/13 1437  BP: 128/90 128/76  Pulse: 81 81  Height: 5\' 9"  (1.753 m) 5\' 9"  (1.753 m)  Weight: 209 lb (94.802 kg) 209 lb (94.802 kg)  SpO2: 97% 97%   Gen: Pleasant, well-nourished, in no distress,  normal affect  ENT: No lesions,  mouth clear,  oropharynx clear, no postnasal drip  Neck: No JVD, no TMG, no carotid bruits  Lungs: No use of accessory muscles, no dullness to percussion, clear without rales or rhonchi  Cardiovascular: RRR, heart sounds normal, no murmur or  gallops, no peripheral edema  Musculoskeletal: No deformities, no cyanosis or clubbing  Neuro: alert, non focal  Skin: Warm, no lesions or rashes   CXR 05/11/13 --  COMPARISON: 08/08/2012  FINDINGS:  The heart size and mediastinal contours are within normal limits.  Both lungs are  clear. The visualized skeletal structures are  unremarkable.  IMPRESSION:  Stable exam. No active cardiopulmonary disease.     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   Please start albuterol 2 puffs as needed for shortness of breath. You may want to take this 10 minutes before doing any exertion to see if it helps you.  Walking oximetry today We will refer you to see Dr Wilburn Cornelia with ENT to evaluate your hoarse voice.  Follow with Dr Lamonte Sakai in 2 months to discuss whether the medication has helped you

## 2013-06-27 NOTE — Patient Instructions (Addendum)
Call Dr. Chalmers Cater about the dose of cortisol.    Your physician recommends that you continue on your current medications as directed. Please refer to the Current Medication list given to you today.  Your physician wants you to follow-up in: 1 year with [name].  You will receive a reminder letter in the mail two months in advance. If you don't receive a letter, please call our office to schedule the follow-up appointment.

## 2013-06-28 ENCOUNTER — Encounter: Payer: BC Managed Care – PPO | Admitting: Vascular Surgery

## 2013-06-28 ENCOUNTER — Other Ambulatory Visit (HOSPITAL_COMMUNITY): Payer: BC Managed Care – PPO

## 2013-07-02 ENCOUNTER — Ambulatory Visit: Payer: BC Managed Care – PPO | Admitting: Cardiovascular Disease

## 2013-07-02 ENCOUNTER — Other Ambulatory Visit: Payer: Self-pay | Admitting: Vascular Surgery

## 2013-07-02 ENCOUNTER — Other Ambulatory Visit: Payer: BC Managed Care – PPO

## 2013-07-02 ENCOUNTER — Ambulatory Visit (HOSPITAL_COMMUNITY)
Admission: RE | Admit: 2013-07-02 | Discharge: 2013-07-02 | Disposition: A | Discharge status: Home / Self Care | Payer: BC Managed Care – PPO | Source: Ambulatory Visit | Attending: Surgery | Admitting: Surgery

## 2013-07-02 DIAGNOSIS — R609 Edema, unspecified: Secondary | ICD-10-CM

## 2013-07-02 DIAGNOSIS — I8000 Phlebitis and thrombophlebitis of superficial vessels of unspecified lower extremity: Secondary | ICD-10-CM

## 2013-07-10 ENCOUNTER — Other Ambulatory Visit: Payer: Self-pay | Admitting: Cardiovascular Disease

## 2013-07-13 ENCOUNTER — Encounter: Payer: Self-pay | Admitting: Surgery

## 2013-07-16 ENCOUNTER — Encounter: Payer: BC Managed Care – PPO | Admitting: Surgery

## 2013-07-25 ENCOUNTER — Other Ambulatory Visit: Payer: Self-pay | Admitting: Cardiovascular Disease

## 2013-08-27 ENCOUNTER — Other Ambulatory Visit: Payer: Self-pay | Admitting: Cardiovascular Disease

## 2013-08-28 ENCOUNTER — Other Ambulatory Visit: Payer: Self-pay | Admitting: Cardiovascular Disease

## 2013-09-14 ENCOUNTER — Other Ambulatory Visit: Payer: Self-pay | Admitting: *Deleted

## 2013-09-14 MED ORDER — POTASSIUM CHLORIDE CRYS ER 10 MEQ PO TBCR: 10.0000 meq | EXTENDED_RELEASE_TABLET | Freq: Two times a day (BID) | ORAL | Status: DC

## 2013-09-24 ENCOUNTER — Other Ambulatory Visit: Payer: Self-pay | Admitting: Gastroenterology

## 2013-10-22 NOTE — Assessment & Plan Note (Addendum)
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   Please start albuterol 2 puffs as needed for shortness of breath. You may want to take this 10 minutes before doing any exertion to see if it helps you.  Walking oximetry today We will refer you to see Dr Wilburn Cornelia with ENT to evaluate your hoarse voice.  Follow with Dr Lamonte Sakai in 2 months to discuss whether the medication has helped you

## 2013-11-16 ENCOUNTER — Ambulatory Visit: Payer: BC Managed Care – PPO | Admitting: Emergency Medicine

## 2013-11-20 ENCOUNTER — Ambulatory Visit: Payer: BC Managed Care – PPO | Admitting: Emergency Medicine

## 2013-11-27 ENCOUNTER — Ambulatory Visit: Payer: BC Managed Care – PPO | Admitting: Emergency Medicine

## 2014-01-16 ENCOUNTER — Other Ambulatory Visit: Payer: Self-pay | Admitting: Emergency Medicine

## 2014-02-14 ENCOUNTER — Encounter: Payer: Self-pay | Admitting: Surgery

## 2014-02-18 ENCOUNTER — Ambulatory Visit (INDEPENDENT_AMBULATORY_CARE_PROVIDER_SITE_OTHER): Payer: Self-pay | Admitting: Surgery

## 2014-02-18 ENCOUNTER — Inpatient Hospital Stay (HOSPITAL_COMMUNITY): Admission: RE | Admit: 2014-02-18 | Payer: BC Managed Care – PPO | Source: Ambulatory Visit

## 2014-02-18 DIAGNOSIS — I809 Phlebitis and thrombophlebitis of unspecified site: Secondary | ICD-10-CM

## 2014-02-22 NOTE — Progress Notes (Signed)
No show

## 2014-03-12 ENCOUNTER — Other Ambulatory Visit: Payer: Self-pay | Admitting: Cardiovascular Disease

## 2014-03-13 NOTE — Telephone Encounter (Signed)
Medication Detail      Disp Refills Start End     carvedilol (COREG) 25 MG tablet 180 tablet 2 07/25/2013     Sig: TAKE 1 TABLET TWICE A DAY WITH MEALS    E-Prescribing Status: Receipt confirmed by pharmacy (07/25/2013 12:20 PM EDT)

## 2014-03-29 ENCOUNTER — Encounter: Payer: Self-pay | Admitting: Surgery

## 2014-04-01 ENCOUNTER — Encounter (HOSPITAL_COMMUNITY): Payer: Medicare Other

## 2014-04-01 ENCOUNTER — Encounter: Payer: Medicare Other | Admitting: Surgery

## 2014-05-07 ENCOUNTER — Ambulatory Visit (INDEPENDENT_AMBULATORY_CARE_PROVIDER_SITE_OTHER): Payer: BLUE CROSS/BLUE SHIELD | Admitting: Nurse Practitioner

## 2014-05-07 ENCOUNTER — Telehealth: Payer: Self-pay | Admitting: Cardiovascular Disease

## 2014-05-07 ENCOUNTER — Encounter: Payer: Self-pay | Admitting: Nurse Practitioner

## 2014-05-07 VITALS — BP 116/80 | HR 75 | Ht 69.0 in | Wt 216.2 lb

## 2014-05-07 DIAGNOSIS — I5033 Acute on chronic diastolic (congestive) heart failure: Secondary | ICD-10-CM | POA: Diagnosis not present

## 2014-05-07 DIAGNOSIS — I10 Essential (primary) hypertension: Secondary | ICD-10-CM | POA: Diagnosis not present

## 2014-05-07 DIAGNOSIS — I5022 Chronic systolic (congestive) heart failure: Secondary | ICD-10-CM | POA: Diagnosis not present

## 2014-05-07 MED ORDER — FUROSEMIDE 40 MG PO TABS: 40.0000 mg | ORAL_TABLET | Freq: Two times a day (BID) | ORAL | Status: DC

## 2014-05-07 NOTE — Telephone Encounter (Signed)
Agree with plans for patient to be seen by [facility].

## 2014-05-07 NOTE — Telephone Encounter (Signed)
Reviewed with [name], NP and he can see pt today at 3 PM.  Wife notified. Pt will be here for appt today

## 2014-05-07 NOTE — Telephone Encounter (Signed)
Pt c/o Shortness Of Breath: STAT if SOB developed within the last 24 hours or pt is noticeably SOB on the phone  1. Are you currently SOB (can you hear that pt is SOB on the phone)? Wife is calling. She reports he is experiencing SOB right now  2. How long have you been experiencing SOB? Within the last 2-3 weeks it has gotten worse and worse.  3. Are you SOB when sitting or when up moving around? With minor movement he experiences sob  4.  Are you currently experiencing any other symptoms? Dizzy light headed. But the SOB is a terrible concern.

## 2014-05-07 NOTE — Progress Notes (Signed)
Patient Name: [name] Date of Encounter: 05/07/2014  Primary Care Provider:   Melinda Crutch, MD Primary Cardiologist:  Joaquim Nam, MD   Chief Complaint  74 y/o male with a h/o pulm HTN and diast CHF who presents with increasing dyspnea and edema.  Past Medical History   Past Medical History  Diagnosis Date  . Chronic diastolic CHF (congestive heart failure)     a. His initial ejection fraction was between 30 and 35%;  b. 04/2013 Echo: EF 50-55% Gr1 DD, mild-mod MR.  . Thyroid disease   . Pulmonary HTN   . Hypertension   . Addison's disease   . Hypogonadism male   . CKD (chronic kidney disease), stage III   . History of aortic insufficiency   . Hx of cardiovascular stress test     a. ETT-Myoview 7/14:  Low risk, inferior defect consistent with thinning, no ischemia, normal wall motion, EF 54%  . DVT (deep venous thrombosis) ~ 02/2013    BLE  . GERD (gastroesophageal reflux disease)   . H/O hiatal hernia   . Hepatitis 1959    "think it was a form of C"   . Daily headache   . Chronic back pain     "neck to tailbone" (04/11/2013)  . Peptic ulcer disease     suspected 04/11/2013  . On home oxygen therapy     "2-3L @ night" (04/11/2013)  . Acute lower GI bleeding     admitted to [facility] 04/11/2013   Past Surgical History  Procedure Laterality Date  . Cardiac catheterization  06/26/2008    EF 30-35%  . US echocardiography  04/09/2010    EF 60-65%  . Tonsillectomy  1940's  . Appendectomy  1950's  . Cholecystectomy  ~ 1965  . Esophagogastroduodenoscopy N/A 04/11/2013    Procedure: ESOPHAGOGASTRODUODENOSCOPY (EGD);  Surgeon: Missy Sabins, MD;  Location: [facility] ENDOSCOPY;  Service: Endoscopy;  Laterality: N/A;    Allergies  No Known Allergies  HPI  74 year old male with a prior history of LV dysfxn with subsequent normalization of LV fxn by echo in 04/2013.  He had a low risk MV in 07/2012.  He also has a h/o PAH and is followed by pulmonology.  About 3 wks ago, he began to note  some DOE and upper chest congestion.  He was scene by his PCP and placed on a steroid taper and abx for URI.  Since then, he has noted progression of DOE along with increasing lower extremity edema, increasing abd girth, and early satiety.  His wt is up about 8 lbs on his home scale. He watches his salt intake very closely and his wife prepares all her meals. They do not routinely go out to eat. He was seen back by primary care yesterday and because of progressive dyspnea, he was advised to present for cardiology evaluation today. He has not had any chest pain. He denies PND, orthopnea, dizziness, syncope.  Home Medications  Prior to Admission medications   Medication Sig Start Date End Date Taking? Authorizing Provider  amLODipine (NORVASC) 5 MG tablet Take 5 mg by mouth daily.  12/05/12  Yes Historical Provider, MD  carvedilol (COREG) 25 MG tablet TAKE 1 TABLET TWICE A DAY WITH MEALS 07/25/13  Yes Thayer Headings, MD  Cholecalciferol (VITAMIN D3 PO) Take 1,000 capsules by mouth daily.    Yes Historical Provider, MD  furosemide (LASIX) 40 MG tablet Take 1 tablet (40 mg total) by mouth 2 (two) times  daily. 05/07/14  Yes Rogelia Mire, NP  hydrocortisone (CORTEF) 20 MG tablet Take 20 mg by mouth 3 (three) times daily.    Yes Historical Provider, MD  lisinopril (PRINIVIL,ZESTRIL) 10 MG tablet TAKE 1 TABLET DAILY (NEEDS TO CONTACT OFFICE TO SCHEDULE APPOINTMENT FOR FUTURE REFILLS)   Yes Thayer Headings, MD  metoCLOPramide (REGLAN) 5 MG tablet Take 5 mg by mouth 4 (four) times daily.   Yes Historical Provider, MD  potassium chloride (K-DUR,KLOR-CON) 10 MEQ tablet Take 1 tablet (10 mEq total) by mouth 2 (two) times daily. 09/14/13  Yes Thayer Headings, MD  PROAIR HFA 108 (90 BASE) MCG/ACT inhaler INHALE 2 PUFFS INTO THE LUNGS EVERY 6 (SIX) HOURS AS NEEDED FOR WHEEZING OR SHORTNESS OF BREATH. 01/16/14  Yes Collene Gobble, MD  thyroid (ARMOUR) 60 MG tablet Take 60 mg by mouth daily.    Yes Historical  Provider, MD  traMADol (ULTRAM) 50 MG tablet Take 50 mg by mouth every 6 (six) hours as needed.   Yes Historical Provider, MD  traZODone (DESYREL) 100 MG tablet Take 100 mg by mouth every evening.  04/16/14  Yes Historical Provider, MD    Review of Systems  As above, is a 2 to three-week history of progressive dyspnea on exertion now associated with lower extremity edema, increasing abdominal girth, and early satiety. He denies PND, chest pain, palpitations, orthopnea, dizziness, or syncope.  All other systems reviewed and are otherwise negative except as noted above.  Physical Exam  VS:  BP 116/80 mmHg  Pulse 75  Ht 5\' 9"  (1.753 m)  Wt 216 lb 3.2 oz (98.068 kg)  BMI 31.91 kg/m2 , BMI Body mass index is 31.91 kg/(m^2). GEN: Well nourished, well developed, in no acute distress. HEENT: normal. Neck: Supple, no carotid bruits, or masses.  JVD present with a JVP of approximately 12.  Cardiac: RRR, no murmurs, rubs, or gallops. No clubbing, cyanosis.  2+ bilateral lower extremity edema to the knees.  Radials/DP/PT 2+ and equal bilaterally.  Respiratory:  Respirations regular and unlabored, clear to auscultation bilaterally. ZS:WFUX, protuberant, nontender  BS + x 4. MS: no deformity or atrophy. Skin: warm and dry, no rash. Neuro:  Strength and sensation are intact. Psych: Normal affect.  Accessory Clinical Findings  ECG - regular sinus rhythm, 75, no acute ST or T changes.  Assessment & Plan  1.  Acute on chronic diastolic congestive heart failure: Patient presented with a 2 to three-week history of progressive dyspnea on exertion, lower extreme edema, early satiety, and increasing abdominal girth. Symptoms of volume overload seemed to worsen after a recent prednisone taper which was given secondary to upper respiratory symptoms. His last echo was a year ago showing normal LV function though he does have a history of LV dysfunction. I will repeat a 2-D echocardiogram. He is on Lasix at  home and I have increased this to 80 mg twice a day over the next week and he will drop back to 40 twice a day following that. He will also increase his potassium chloride to 20 mg twice a day. We will check basic metabolic profile today. I will see him back in clinic within the next 1-2 weeks.  Continue beta blocker, ACE inhibitor.  2. Hypertension: Stable on current regimen including beta blocker, ACE inhibitor, and calcium channel blocker.  3. Disposition: Basic metabolic panel today. Echo this week. Increase Lasix over the next week and follow-up within the next 1-2 weeks.   Murray Hodgkins,  NP 05/07/2014, 4:57 PM

## 2014-05-07 NOTE — Telephone Encounter (Signed)
Received direct call from operator and spoke with pt's wife. She report pt has had increasing shortness of breath for last few weeks. Also with weight gain. Does not weigh daily but a few weeks ago weight was around 200 lbs and is now 216 lbs. Pt was treated with prednisone and antibiotic in mid March by primary care for congestion and cough.  Little improvement. Saw primary care yesterday and chest X-ray done. Have not heard results. Wife reports primary care told her to contact cardiology to be seen this week.  Wife reports pt is short of breath with activity.  Will be short of breath when putting on socks or brushing teeth.  Pt last saw Dr. Cathie Olden in June 2015 and dyspnea noted at that time but wife states shortness of breath has greatly worsened recently.Improves some with rest.   Saw pulmonary last June but has not seen since.  Will review with provider in office.

## 2014-05-07 NOTE — Patient Instructions (Signed)
Medication Instructions:  Increase Lasix ( 80 mg ) twice a day for one weeks, than go back to ( 40 mg ) twice a day Increase Potassium ( 20 meq) twice a day for one week, than go back to ( 10 meq) twice a day Will send in prescription today per patient's request for lasix Labwork: Bmet  Testing/Procedures: Your physician has requested that you have an echocardiogram. Echocardiography is a painless test that uses sound waves to create images of your heart. It provides your doctor with information about the size and shape of your heart and how well your heart's chambers and valves are working. This procedure takes approximately one hour. There are no restrictions for this procedure.   Follow-Up: 2 weeks with Dennis Bayley, NP  Any Other Special Instructions Will Be Listed Below (If Applicable).  Call sooner if any problems to get appointment

## 2014-05-08 LAB — BASIC METABOLIC PANEL
BUN: 26 mg/dL — AB (ref 6–23)
CALCIUM: 8.9 mg/dL (ref 8.4–10.5)
CO2: 26 mEq/L (ref 19–32)
CREATININE: 1.64 mg/dL — AB (ref 0.40–1.50)
Chloride: 105 mEq/L (ref 96–112)
GFR: 43.95 mL/min — AB (ref >60.00)
GLUCOSE: 119 mg/dL — AB (ref 70–99)
Potassium: 4.3 mEq/L (ref 3.5–5.1)
SODIUM: 141 meq/L (ref 135–145)

## 2014-05-10 ENCOUNTER — Telehealth: Payer: Self-pay | Admitting: Cardiology

## 2014-05-10 ENCOUNTER — Encounter: Payer: Self-pay | Admitting: Cardiology

## 2014-05-10 ENCOUNTER — Other Ambulatory Visit: Payer: Self-pay

## 2014-05-10 ENCOUNTER — Inpatient Hospital Stay (HOSPITAL_COMMUNITY)
Admission: EM | Admit: 2014-05-10 | Discharge: 2014-05-13 | DRG: 176 | Disposition: A | Discharge status: Home / Self Care | Payer: Medicare Other | Attending: Internal Medicine | Admitting: Internal Medicine

## 2014-05-10 ENCOUNTER — Ambulatory Visit (HOSPITAL_BASED_OUTPATIENT_CLINIC_OR_DEPARTMENT_OTHER): Payer: Medicare Other | Admitting: Radiology

## 2014-05-10 ENCOUNTER — Other Ambulatory Visit: Payer: BLUE CROSS/BLUE SHIELD

## 2014-05-10 ENCOUNTER — Ambulatory Visit (INDEPENDENT_AMBULATORY_CARE_PROVIDER_SITE_OTHER): Payer: BLUE CROSS/BLUE SHIELD | Admitting: Cardiology

## 2014-05-10 ENCOUNTER — Encounter (HOSPITAL_COMMUNITY): Payer: Self-pay | Admitting: *Deleted

## 2014-05-10 ENCOUNTER — Encounter (HOSPITAL_COMMUNITY): Payer: Self-pay | Admitting: Radiology

## 2014-05-10 VITALS — BP 148/84 | HR 78 | Resp 20

## 2014-05-10 DIAGNOSIS — I2699 Other pulmonary embolism without acute cor pulmonale: Principal | ICD-10-CM | POA: Diagnosis present

## 2014-05-10 DIAGNOSIS — I129 Hypertensive chronic kidney disease with stage 1 through stage 4 chronic kidney disease, or unspecified chronic kidney disease: Secondary | ICD-10-CM | POA: Diagnosis present

## 2014-05-10 DIAGNOSIS — J449 Chronic obstructive pulmonary disease, unspecified: Secondary | ICD-10-CM | POA: Diagnosis present

## 2014-05-10 DIAGNOSIS — E876 Hypokalemia: Secondary | ICD-10-CM | POA: Diagnosis present

## 2014-05-10 DIAGNOSIS — E039 Hypothyroidism, unspecified: Secondary | ICD-10-CM | POA: Diagnosis present

## 2014-05-10 DIAGNOSIS — I5022 Chronic systolic (congestive) heart failure: Secondary | ICD-10-CM | POA: Diagnosis not present

## 2014-05-10 DIAGNOSIS — N183 Chronic kidney disease, stage 3 unspecified: Secondary | ICD-10-CM | POA: Diagnosis present

## 2014-05-10 DIAGNOSIS — E271 Primary adrenocortical insufficiency: Secondary | ICD-10-CM | POA: Diagnosis present

## 2014-05-10 DIAGNOSIS — I10 Essential (primary) hypertension: Secondary | ICD-10-CM | POA: Diagnosis not present

## 2014-05-10 DIAGNOSIS — I5033 Acute on chronic diastolic (congestive) heart failure: Secondary | ICD-10-CM

## 2014-05-10 DIAGNOSIS — K449 Diaphragmatic hernia without obstruction or gangrene: Secondary | ICD-10-CM | POA: Diagnosis present

## 2014-05-10 DIAGNOSIS — K279 Peptic ulcer, site unspecified, unspecified as acute or chronic, without hemorrhage or perforation: Secondary | ICD-10-CM | POA: Diagnosis present

## 2014-05-10 DIAGNOSIS — R0602 Shortness of breath: Secondary | ICD-10-CM

## 2014-05-10 DIAGNOSIS — R0609 Other forms of dyspnea: Secondary | ICD-10-CM

## 2014-05-10 DIAGNOSIS — Z86718 Personal history of other venous thrombosis and embolism: Secondary | ICD-10-CM

## 2014-05-10 DIAGNOSIS — I272 Other secondary pulmonary hypertension: Secondary | ICD-10-CM | POA: Diagnosis present

## 2014-05-10 DIAGNOSIS — I5042 Chronic combined systolic (congestive) and diastolic (congestive) heart failure: Secondary | ICD-10-CM | POA: Diagnosis present

## 2014-05-10 DIAGNOSIS — M549 Dorsalgia, unspecified: Secondary | ICD-10-CM | POA: Diagnosis present

## 2014-05-10 DIAGNOSIS — K2970 Gastritis, unspecified, without bleeding: Secondary | ICD-10-CM | POA: Diagnosis present

## 2014-05-10 DIAGNOSIS — K219 Gastro-esophageal reflux disease without esophagitis: Secondary | ICD-10-CM | POA: Diagnosis present

## 2014-05-10 DIAGNOSIS — G8929 Other chronic pain: Secondary | ICD-10-CM | POA: Diagnosis present

## 2014-05-10 DIAGNOSIS — K2980 Duodenitis without bleeding: Secondary | ICD-10-CM | POA: Diagnosis present

## 2014-05-10 DIAGNOSIS — D510 Vitamin B12 deficiency anemia due to intrinsic factor deficiency: Secondary | ICD-10-CM | POA: Diagnosis present

## 2014-05-10 LAB — D-DIMER, QUANTITATIVE (NOT AT ARMC): D DIMER QUANT: 4.23 ug{FEU}/mL — AB (ref 0.00–0.48)

## 2014-05-10 LAB — BRAIN NATRIURETIC PEPTIDE: Pro B Natriuretic peptide (BNP): 24 pg/mL (ref 0.0–100.0)

## 2014-05-10 NOTE — Progress Notes (Signed)
Echocardiogram performed. Patient arrived for his appointment complaining of severe shortness of breath, edema and nausea.  Triage nurse was notified.  Patient was assessed by triage nurse and Dr. Radford Pax.  Patient was discharged to lab per Dr. Radford Pax.

## 2014-05-10 NOTE — Telephone Encounter (Signed)
I went into [facility] to see results of Chest CT and ER visit after my nurse called him to tell him that D-Dimer was elevated and he needed to go to the ER ASAP.  There was no record of ER visit so I called the patient and spoke with his wife.  She informed me that they had decided to wait until morning.   I instructed her to take her husband to [facility] ER immediately.  He has a high d-dimer and history of bilateral DVTs a year ago so probability of D-Dimer is very high.  I explained to his wife that if he has a PE and does not seek medical attention immediately it could result in death.  She understood and said she would take him immediately.

## 2014-05-10 NOTE — ED Notes (Signed)
Headache nausea today  With indigestion

## 2014-05-10 NOTE — Progress Notes (Addendum)
Cardiology Office Note   Date:  05/10/2014   ID:  [name], DOB [birth year], MRN [id number]  PCP:   Melinda Crutch, MD    Chief Complaint  Patient presents with  . Shortness of Breath      History of Present Illness: [name] is a 74 y.o. male who presented today for echocardiogram in workup for SOB. He has a prior history of LV dysfxn with subsequent normalization of LV fxn by echo in 04/2013. He had a low risk MV in 07/2012. He also has a h/o PAH and is followed by pulmonology. About 3 wks ago, he began to note some DOE and upper chest congestion. He was seen by his PCP and placed on a steroid taper and abx for URI. Since then, he has noted progression of DOE along with increasing lower extremity edema, increasing abd girth, and early satiety. He watches his salt intake very closely and his wife prepares all her meals. They do not routinely go out to eat. He was seen back by primary care on 4/11 and because of progressive dyspnea, he was advised to present for cardiology evaluation 4/12. He has not had any chest pain. He denies PND, orthopnea, dizziness, syncope.  His Lasix was increased to 80mg  BID without any improvement and actually has gotten worse since then.  He presented today for 2D echo and was very SOB.  He has a history of bilateral DVT 02/2013.   Past Medical History  Diagnosis Date  . Chronic diastolic CHF (congestive heart failure)     a. His initial ejection fraction was between 30 and 35%;  b. 04/2013 Echo: EF 50-55% Gr1 DD, mild-mod MR.  . Thyroid disease   . Pulmonary HTN   . Hypertension   . Addison's disease   . Hypogonadism male   . CKD (chronic kidney disease), stage III   . History of aortic insufficiency   . Hx of cardiovascular stress test     a. ETT-Myoview 7/14:  Low risk, inferior defect consistent with thinning, no ischemia, normal wall motion, EF 54%  . DVT (deep venous thrombosis) ~ 02/2013    BLE  . GERD (gastroesophageal reflux  disease)   . H/O hiatal hernia   . Hepatitis 1959    "think it was a form of C"   . Daily headache   . Chronic back pain     "neck to tailbone" (04/11/2013)  . Peptic ulcer disease     suspected 04/11/2013  . On home oxygen therapy     "2-3L @ night" (04/11/2013)  . Acute lower GI bleeding     admitted to [facility] 04/11/2013    Past Surgical History  Procedure Laterality Date  . Cardiac catheterization  06/26/2008    EF 30-35%  . US echocardiography  04/09/2010    EF 60-65%  . Tonsillectomy  1940's  . Appendectomy  1950's  . Cholecystectomy  ~ 1965  . Esophagogastroduodenoscopy N/A 04/11/2013    Procedure: ESOPHAGOGASTRODUODENOSCOPY (EGD);  Surgeon: Missy Sabins, MD;  Location: [facility] ENDOSCOPY;  Service: Endoscopy;  Laterality: N/A;     Current Outpatient Prescriptions  Medication Sig Dispense Refill  . amLODipine (NORVASC) 5 MG tablet Take 5 mg by mouth daily.     . carvedilol (COREG) 25 MG tablet TAKE 1 TABLET TWICE A DAY WITH MEALS 180 tablet 2  . Cholecalciferol (VITAMIN D3 PO) Take 1,000 capsules by mouth daily.     . furosemide (LASIX) 40 MG  tablet Take 1 tablet (40 mg total) by mouth 2 (two) times daily. 180 tablet 1  . hydrocortisone (CORTEF) 20 MG tablet Take 20 mg by mouth 3 (three) times daily.     Marland Kitchen lisinopril (PRINIVIL,ZESTRIL) 10 MG tablet TAKE 1 TABLET DAILY (NEEDS TO CONTACT OFFICE TO SCHEDULE APPOINTMENT FOR FUTURE REFILLS) 90 tablet 3  . metoCLOPramide (REGLAN) 5 MG tablet Take 5 mg by mouth 4 (four) times daily.    . potassium chloride (K-DUR,KLOR-CON) 10 MEQ tablet Take 1 tablet (10 mEq total) by mouth 2 (two) times daily. 180 tablet 1  . PROAIR HFA 108 (90 BASE) MCG/ACT inhaler INHALE 2 PUFFS INTO THE LUNGS EVERY 6 (SIX) HOURS AS NEEDED FOR WHEEZING OR SHORTNESS OF BREATH. 8.5 each 2  . thyroid (ARMOUR) 60 MG tablet Take 60 mg by mouth daily.     . traMADol (ULTRAM) 50 MG tablet Take 50 mg by mouth every 6 (six) hours as needed.    . traZODone (DESYREL) 100 MG tablet  Take 100 mg by mouth every evening.   2   No current facility-administered medications for this visit.    Allergies:   Review of patient's allergies indicates no known allergies.    Social History:  The patient  reports that he has never smoked. He has never used smokeless tobacco. He reports that he does not drink alcohol or use illicit drugs.   Family History:  The patient's family history includes Clotting disorder in his brother, brother, brother, mother, and other.    ROS:  Please see the history of present illness.   Otherwise, review of systems are positive for none.   All other systems are reviewed and negative.    PHYSICAL EXAM: VS:  BP 148/84 mmHg  Pulse 78  Resp 20  SpO2 100% , BMI There is no weight on file to calculate BMI. GEN: Well nourished, well developed, in no acute distress HEENT: normal Neck: no JVD, carotid bruits, or masses Cardiac: RRR; no murmurs, rubs, or gallops,no edema  Respiratory:  clear to auscultation bilaterally, normal work of breathing GI: soft, nontender, nondistended, + BS MS: no deformity or atrophy Skin: warm and dry, no rash Neuro:  Strength and sensation are intact Psych: euthymic mood, full affect   EKG:  EKG is not ordered today.    Recent Labs: 05/18/2013: Hemoglobin 9.8*; Platelets 339.0 05/07/2014: BUN 26*; Creatinine 1.64*; Potassium 4.3; Sodium 141 05/10/2014: Pro B Natriuretic peptide (BNP) 24.0    Lipid Panel    Component Value Date/Time   CHOL 180 05/05/2010 1146   TRIG 128.0 05/05/2010 1146   HDL 31.30* 05/05/2010 1146   CHOLHDL 6 05/05/2010 1146   VLDL 25.6 05/05/2010 1146   LDLCALC 123* 05/05/2010 1146      Wt Readings from Last 3 Encounters:  05/07/14 216 lb 3.2 oz (98.068 kg)  06/27/13 209 lb (94.802 kg)  06/27/13 206 lb (93.441 kg)    Assessment & Plan  1. Chronic diastolic congestive heart failure: Patient presented with a 2 to three-week history of progressive dyspnea on exertion, lower extreme  edema, early satiety, and increasing abdominal girth. Symptoms of volume overload seemed to worsen after a recent prednisone taper which was given secondary to upper respiratory symptoms. His last echo was a year ago showing normal LV function though he does have a history of LV dysfunction.  Lasix was increased this to 80 mg twice a day without any improvement in SOB and actually he has gotten worse. Exam shows  clear lungs bilaterally.  I have recommended that we get a stat d-dimer to rule out acute PE given his history of bilateral LE DVTs a year ago and recent LE edema.  2D echo pending but initial images show normal LVF.  I will get a BNP as well.  Await final results of echo. 2. Hypertension: Borderline control on current regimen including beta blocker, ACE inhibitor, and calcium channel blocker 3.  SOB - see above   Current medicines are reviewed at length with the patient today.  The patient does not have concerns regarding medicines.  The following changes have been made:  no change  Labs/ tests ordered today include: see above assessment and plan No orders of the defined types were placed in this encounter.     Disposition:   FU with [name], NP on 4/25   SignedSueanne Margarita, MD  05/10/2014 10:50 PM    [facility] [place], [place], [place]  [address] Phone: [phone]; Fax: [phone]

## 2014-05-10 NOTE — ED Notes (Signed)
The pt has been sob for 3 weeks .  He saw dr turtner earlier today and tonight they were called because his d-dimer was elevated.  He has had dvts in his legs recently.

## 2014-05-11 ENCOUNTER — Emergency Department (HOSPITAL_COMMUNITY): Payer: Medicare Other

## 2014-05-11 ENCOUNTER — Encounter (HOSPITAL_COMMUNITY): Payer: Self-pay | Admitting: Radiology

## 2014-05-11 DIAGNOSIS — I272 Other secondary pulmonary hypertension: Secondary | ICD-10-CM | POA: Diagnosis present

## 2014-05-11 DIAGNOSIS — M549 Dorsalgia, unspecified: Secondary | ICD-10-CM | POA: Diagnosis present

## 2014-05-11 DIAGNOSIS — I2699 Other pulmonary embolism without acute cor pulmonale: Secondary | ICD-10-CM | POA: Diagnosis present

## 2014-05-11 DIAGNOSIS — I5042 Chronic combined systolic (congestive) and diastolic (congestive) heart failure: Secondary | ICD-10-CM | POA: Diagnosis present

## 2014-05-11 DIAGNOSIS — R0602 Shortness of breath: Secondary | ICD-10-CM | POA: Diagnosis present

## 2014-05-11 DIAGNOSIS — K2970 Gastritis, unspecified, without bleeding: Secondary | ICD-10-CM | POA: Diagnosis present

## 2014-05-11 DIAGNOSIS — I129 Hypertensive chronic kidney disease with stage 1 through stage 4 chronic kidney disease, or unspecified chronic kidney disease: Secondary | ICD-10-CM | POA: Diagnosis present

## 2014-05-11 DIAGNOSIS — J449 Chronic obstructive pulmonary disease, unspecified: Secondary | ICD-10-CM | POA: Diagnosis present

## 2014-05-11 DIAGNOSIS — Z86718 Personal history of other venous thrombosis and embolism: Secondary | ICD-10-CM | POA: Diagnosis not present

## 2014-05-11 DIAGNOSIS — I5022 Chronic systolic (congestive) heart failure: Secondary | ICD-10-CM | POA: Diagnosis not present

## 2014-05-11 DIAGNOSIS — N183 Chronic kidney disease, stage 3 (moderate): Secondary | ICD-10-CM | POA: Diagnosis not present

## 2014-05-11 DIAGNOSIS — G8929 Other chronic pain: Secondary | ICD-10-CM | POA: Diagnosis present

## 2014-05-11 DIAGNOSIS — E039 Hypothyroidism, unspecified: Secondary | ICD-10-CM | POA: Diagnosis present

## 2014-05-11 DIAGNOSIS — E271 Primary adrenocortical insufficiency: Secondary | ICD-10-CM | POA: Diagnosis present

## 2014-05-11 DIAGNOSIS — D510 Vitamin B12 deficiency anemia due to intrinsic factor deficiency: Secondary | ICD-10-CM | POA: Diagnosis present

## 2014-05-11 DIAGNOSIS — K449 Diaphragmatic hernia without obstruction or gangrene: Secondary | ICD-10-CM | POA: Diagnosis present

## 2014-05-11 DIAGNOSIS — K2980 Duodenitis without bleeding: Secondary | ICD-10-CM | POA: Diagnosis present

## 2014-05-11 DIAGNOSIS — K279 Peptic ulcer, site unspecified, unspecified as acute or chronic, without hemorrhage or perforation: Secondary | ICD-10-CM | POA: Diagnosis not present

## 2014-05-11 DIAGNOSIS — E876 Hypokalemia: Secondary | ICD-10-CM | POA: Diagnosis present

## 2014-05-11 DIAGNOSIS — K219 Gastro-esophageal reflux disease without esophagitis: Secondary | ICD-10-CM | POA: Diagnosis present

## 2014-05-11 LAB — TROPONIN I
Troponin I: 0.03 ng/mL (ref <0.031)
Troponin I: <0.03 ng/mL (ref <0.031)
Troponin I: <0.03 ng/mL (ref <0.031)
Troponin I: <0.03 ng/mL (ref <0.031)

## 2014-05-11 LAB — COMPREHENSIVE METABOLIC PANEL
ALT: 16 U/L (ref 0–53)
ANION GAP: 15 (ref 5–15)
AST: 12 U/L (ref 0–37)
Albumin: 3.2 g/dL — ABNORMAL LOW (ref 3.5–5.2)
Alkaline Phosphatase: 50 U/L (ref 39–117)
BUN: 24 mg/dL — AB (ref 6–23)
CHLORIDE: 104 mmol/L (ref 96–112)
CO2: 22 mmol/L (ref 19–32)
Calcium: 8.4 mg/dL (ref 8.4–10.5)
Creatinine, Ser: 1.65 mg/dL — ABNORMAL HIGH (ref 0.50–1.35)
GFR calc Af Amer: 46 mL/min — ABNORMAL LOW (ref >90)
GFR calc non Af Amer: 40 mL/min — ABNORMAL LOW (ref >90)
GLUCOSE: 103 mg/dL — AB (ref 70–99)
Potassium: 2.9 mmol/L — ABNORMAL LOW (ref 3.5–5.1)
Sodium: 141 mmol/L (ref 135–145)
TOTAL PROTEIN: 6 g/dL (ref 6.0–8.3)
Total Bilirubin: 0.7 mg/dL (ref 0.3–1.2)

## 2014-05-11 LAB — BRAIN NATRIURETIC PEPTIDE: B NATRIURETIC PEPTIDE 5: 26.9 pg/mL (ref 0.0–100.0)

## 2014-05-11 LAB — CBC WITH DIFFERENTIAL/PLATELET
BASOS PCT: 0 % (ref 0–1)
Basophils Absolute: 0 10*3/uL (ref 0.0–0.1)
Basophils Absolute: 0 10*3/uL (ref 0.0–0.1)
Basophils Relative: 0 % (ref 0–1)
EOS ABS: 0.1 10*3/uL (ref 0.0–0.7)
EOS ABS: 0.2 10*3/uL (ref 0.0–0.7)
EOS PCT: 1 % (ref 0–5)
Eosinophils Relative: 2 % (ref 0–5)
HCT: 37.8 % — ABNORMAL LOW (ref 39.0–52.0)
HEMATOCRIT: 35 % — AB (ref 39.0–52.0)
Hemoglobin: 11.6 g/dL — ABNORMAL LOW (ref 13.0–17.0)
Hemoglobin: 12.5 g/dL — ABNORMAL LOW (ref 13.0–17.0)
LYMPHS ABS: 1.9 10*3/uL (ref 0.7–4.0)
Lymphocytes Relative: 14 % (ref 12–46)
Lymphocytes Relative: 22 % (ref 12–46)
Lymphs Abs: 2.8 10*3/uL (ref 0.7–4.0)
MCH: 30 pg (ref 26.0–34.0)
MCH: 30.4 pg (ref 26.0–34.0)
MCHC: 33.1 g/dL (ref 30.0–36.0)
MCHC: 33.1 g/dL (ref 30.0–36.0)
MCV: 90.4 fL (ref 78.0–100.0)
MCV: 92 fL (ref 78.0–100.0)
MONOS PCT: 7 % (ref 3–12)
Monocytes Absolute: 0.9 10*3/uL (ref 0.1–1.0)
Monocytes Absolute: 1.1 10*3/uL — ABNORMAL HIGH (ref 0.1–1.0)
Monocytes Relative: 9 % (ref 3–12)
Neutro Abs: 10.4 10*3/uL — ABNORMAL HIGH (ref 1.7–7.7)
Neutro Abs: 8.2 10*3/uL — ABNORMAL HIGH (ref 1.7–7.7)
Neutrophils Relative %: 67 % (ref 43–77)
Neutrophils Relative %: 78 % — ABNORMAL HIGH (ref 43–77)
PLATELETS: 175 10*3/uL (ref 150–400)
Platelets: 225 10*3/uL (ref 150–400)
RBC: 3.87 MIL/uL — ABNORMAL LOW (ref 4.22–5.81)
RBC: 4.11 MIL/uL — ABNORMAL LOW (ref 4.22–5.81)
RDW: 15.8 % — AB (ref 11.5–15.5)
RDW: 15.9 % — ABNORMAL HIGH (ref 11.5–15.5)
WBC: 12.3 10*3/uL — ABNORMAL HIGH (ref 4.0–10.5)
WBC: 13.3 10*3/uL — ABNORMAL HIGH (ref 4.0–10.5)

## 2014-05-11 LAB — TSH: TSH: 0.322 u[IU]/mL — ABNORMAL LOW (ref 0.350–4.500)

## 2014-05-11 LAB — GLUCOSE, CAPILLARY
GLUCOSE-CAPILLARY: 168 mg/dL — AB (ref 70–99)
GLUCOSE-CAPILLARY: 97 mg/dL (ref 70–99)
Glucose-Capillary: 88 mg/dL (ref 70–99)
Glucose-Capillary: 101 mg/dL — ABNORMAL HIGH (ref 70–99)

## 2014-05-11 LAB — BASIC METABOLIC PANEL
Anion gap: 14 (ref 5–15)
BUN: 26 mg/dL — ABNORMAL HIGH (ref 6–23)
CO2: 24 mmol/L (ref 19–32)
CREATININE: 1.88 mg/dL — AB (ref 0.50–1.35)
Calcium: 9 mg/dL (ref 8.4–10.5)
Chloride: 103 mmol/L (ref 96–112)
GFR calc Af Amer: 39 mL/min — ABNORMAL LOW (ref >90)
GFR calc non Af Amer: 34 mL/min — ABNORMAL LOW (ref >90)
GLUCOSE: 176 mg/dL — AB (ref 70–99)
Potassium: 3.5 mmol/L (ref 3.5–5.1)
SODIUM: 141 mmol/L (ref 135–145)

## 2014-05-11 LAB — MAGNESIUM: Magnesium: 2.1 mg/dL (ref 1.5–2.5)

## 2014-05-11 LAB — D-DIMER, QUANTITATIVE: D-Dimer, Quant: 4.8 ug/mL-FEU — ABNORMAL HIGH (ref 0.00–0.48)

## 2014-05-11 LAB — MRSA PCR SCREENING: MRSA by PCR: NEGATIVE

## 2014-05-11 LAB — HEPARIN LEVEL (UNFRACTIONATED)
Heparin Unfractionated: 0.59 IU/mL (ref 0.30–0.70)
Heparin Unfractionated: 0.52 IU/mL (ref 0.30–0.70)

## 2014-05-11 LAB — CBG MONITORING, ED: GLUCOSE-CAPILLARY: 95 mg/dL (ref 70–99)

## 2014-05-11 MED ORDER — ALBUTEROL SULFATE (2.5 MG/3ML) 0.083% IN NEBU
2.5000 mg | INHALATION_SOLUTION | RESPIRATORY_TRACT | Status: DC | PRN
Start: 2014-05-11 — End: 2014-05-13

## 2014-05-11 MED ORDER — IOHEXOL 350 MG/ML SOLN: 80.0000 mL | Freq: Once | INTRAVENOUS | Status: DC | PRN

## 2014-05-11 MED ORDER — TRAZODONE HCL 50 MG PO TABS
50.0000 mg | ORAL_TABLET | Freq: Every evening | ORAL | Status: DC
  Administered 2014-05-11: 50 mg via ORAL
  Administered 2014-05-12 – 2014-05-13 (×2): 100 mg via ORAL
  Filled 2014-05-11 (×3): qty 2

## 2014-05-11 MED ORDER — SODIUM CHLORIDE 0.9 % IV SOLN
INTRAVENOUS | Status: DC
  Administered 2014-05-11 (×2): via INTRAVENOUS

## 2014-05-11 MED ORDER — CARVEDILOL 25 MG PO TABS
25.0000 mg | ORAL_TABLET | Freq: Two times a day (BID) | ORAL | Status: DC
  Administered 2014-05-11 – 2014-05-13 (×6): 25 mg via ORAL
  Filled 2014-05-11 (×8): qty 1

## 2014-05-11 MED ORDER — AMLODIPINE BESYLATE 5 MG PO TABS
5.0000 mg | ORAL_TABLET | Freq: Every day | ORAL | Status: DC
  Administered 2014-05-11 – 2014-05-12 (×2): 5 mg via ORAL
  Filled 2014-05-11 (×3): qty 1

## 2014-05-11 MED ORDER — SODIUM CHLORIDE 0.9 % IJ SOLN
3.0000 mL | Freq: Two times a day (BID) | INTRAMUSCULAR | Status: DC
  Administered 2014-05-11 – 2014-05-12 (×2): 3 mL via INTRAVENOUS

## 2014-05-11 MED ORDER — HYDROCORTISONE 20 MG PO TABS
20.0000 mg | ORAL_TABLET | Freq: Three times a day (TID) | ORAL | Status: DC
  Administered 2014-05-11 – 2014-05-13 (×8): 20 mg via ORAL
  Filled 2014-05-11 (×11): qty 1

## 2014-05-11 MED ORDER — ALBUTEROL SULFATE HFA 108 (90 BASE) MCG/ACT IN AERS: 2.0000 | INHALATION_SPRAY | RESPIRATORY_TRACT | Status: DC | PRN

## 2014-05-11 MED ORDER — HEPARIN BOLUS VIA INFUSION
4500.0000 [IU] | Freq: Once | INTRAVENOUS | Status: AC
  Filled 2014-05-11: qty 4500

## 2014-05-11 MED ORDER — POTASSIUM CHLORIDE 10 MEQ/100ML IV SOLN
10.0000 meq | INTRAVENOUS | Status: AC
  Administered 2014-05-11 (×2): 10 meq via INTRAVENOUS
  Filled 2014-05-11 (×2): qty 100

## 2014-05-11 MED ORDER — SODIUM CHLORIDE 0.9 % IV BOLUS (SEPSIS)
500.0000 mL | Freq: Once | INTRAVENOUS | Status: AC
  Administered 2014-05-11: 500 mL via INTRAVENOUS

## 2014-05-11 MED ORDER — INSULIN ASPART 100 UNIT/ML ~~LOC~~ SOLN
0.0000 [IU] | SUBCUTANEOUS | Status: DC
  Administered 2014-05-11: 2 [IU] via SUBCUTANEOUS
  Administered 2014-05-12: 1 [IU] via SUBCUTANEOUS
  Administered 2014-05-12: 2 [IU] via SUBCUTANEOUS

## 2014-05-11 MED ORDER — HEPARIN (PORCINE) IN NACL 100-0.45 UNIT/ML-% IJ SOLN
1300.0000 [IU]/h | INTRAMUSCULAR | Status: DC
  Administered 2014-05-11 (×2): 1200 [IU]/h via INTRAVENOUS
  Filled 2014-05-11 (×6): qty 250

## 2014-05-11 MED ORDER — POTASSIUM CHLORIDE 10 MEQ/100ML IV SOLN
10.0000 meq | Freq: Once | INTRAVENOUS | Status: DC
  Administered 2014-05-11: 10 meq via INTRAVENOUS

## 2014-05-11 MED ORDER — THYROID 60 MG PO TABS
60.0000 mg | ORAL_TABLET | Freq: Every day | ORAL | Status: DC
  Administered 2014-05-11 – 2014-05-12 (×2): 60 mg via ORAL
  Filled 2014-05-11 (×3): qty 1

## 2014-05-11 MED ORDER — IOHEXOL 350 MG/ML SOLN
75.0000 mL | Freq: Once | INTRAVENOUS | Status: AC | PRN
  Administered 2014-05-11: 75 mL via INTRAVENOUS

## 2014-05-11 MED ORDER — TRAMADOL HCL 50 MG PO TABS
50.0000 mg | ORAL_TABLET | Freq: Three times a day (TID) | ORAL | Status: DC
  Administered 2014-05-11 – 2014-05-13 (×7): 50 mg via ORAL
  Filled 2014-05-11 (×7): qty 1

## 2014-05-11 MED ORDER — POTASSIUM CHLORIDE CRYS ER 20 MEQ PO TBCR: 40.0000 meq | EXTENDED_RELEASE_TABLET | Freq: Two times a day (BID) | ORAL | Status: DC

## 2014-05-11 MED ORDER — METOCLOPRAMIDE HCL 5 MG PO TABS
5.0000 mg | ORAL_TABLET | Freq: Every day | ORAL | Status: DC
  Administered 2014-05-11 – 2014-05-13 (×3): 5 mg via ORAL
  Filled 2014-05-11 (×5): qty 1

## 2014-05-11 NOTE — ED Notes (Signed)
Dr. Wentz at the bedside.  

## 2014-05-11 NOTE — ED Notes (Signed)
Called [facility]ilab for add on mag level.

## 2014-05-11 NOTE — ED Notes (Signed)
Verified with main lab that mag level has already been added.
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
## 2014-05-11 NOTE — ED Provider Notes (Signed)
CSN: [id number]     Arrival date & time 05/10/14  2332 History   First MD Initiated Contact with Patient 05/10/14 2354     Chief Complaint  Patient presents with  . Shortness of Breath    (Consider location/radiation/quality/duration/timing/severity/associated sxs/prior Treatment) HPI Comments: Patient is a 74 year old male with a history of chronic diastolic CHF, pulmonary hypertension, Addison's disease, CK ED stage III, bilateral DVT in 02/2013, and peptic ulcer disease. Patient was instructed to present to the emergency department this evening secondary to an elevated d-dimer ordered by Dr. Golden Hurter of cardiology. Patient reports 2-3 weeks of progressive dyspnea on exertion which has been worsening. He has also had intermittent lower extremity edema bilaterally. Symptoms associated with nausea, headaches, and lightheadedness as well as a few episodes of near syncope recently. No reported syncope or hemoptysis. Symptoms began in mid March.   He was initially treated for an upper respiratory infection with a prednisone taper and antibiotics. He also saw his endocrinologist, Dr. Chalmers Cater, who reported there was no problem with his Addison's disease. Patient had his Lasix increased to 80 mg twice a day by his cardiologist, thinking that his symptoms are secondary to worsening CHF. Patient reports that he believes his symptoms actually worsened with this. He followed up in the office with Dr. Golden Hurter today for an outpatient echocardiogram. Per the note of Dr. Radford Pax, patient's 2-D echo is pending, but on her evaluation his LVEF appeared normal. She completed a D dimer which was positive at 4.23. Patient reports that there is a strong family history of DVT and PE. He states that all of his brothers and mother had blood clots. He was treated with Xarelto in February 2015 for bilateral DVTs, but this medication was discontinued approximately 4 weeks later as he suffered a significant GI bleed.  Patient  is a 74 y.o. male presenting with shortness of breath. The history is provided by the patient. No language interpreter was used.  Shortness of Breath Associated symptoms: cough and headaches   Associated symptoms: no abdominal pain, no chest pain, no fever and no vomiting     Past Medical History  Diagnosis Date  . Chronic diastolic CHF (congestive heart failure)     a. His initial ejection fraction was between 30 and 35%;  b. 04/2013 Echo: EF 50-55% Gr1 DD, mild-mod MR.  . Thyroid disease   . Pulmonary HTN   . Hypertension   . Addison's disease   . Hypogonadism male   . CKD (chronic kidney disease), stage III   . History of aortic insufficiency   . Hx of cardiovascular stress test     a. ETT-Myoview 7/14:  Low risk, inferior defect consistent with thinning, no ischemia, normal wall motion, EF 54%  . DVT (deep venous thrombosis) ~ 02/2013    BLE  . GERD (gastroesophageal reflux disease)   . H/O hiatal hernia   . Hepatitis 1959    "think it was a form of C"   . Daily headache   . Chronic back pain     "neck to tailbone" (04/11/2013)  . Peptic ulcer disease     suspected 04/11/2013  . On home oxygen therapy     "2-3L @ night" (04/11/2013)  . Acute lower GI bleeding     admitted to [facility]/11/2013   Past Surgical History  Procedure Laterality Date  . Cardiac catheterization  06/26/2008    EF 30-35%  . US echocardiography  04/09/2010    EF 60-65%  .  Tonsillectomy  1940's  . Appendectomy  1950's  . Cholecystectomy  ~ 1965  . Esophagogastroduodenoscopy N/A 04/11/2013    Procedure: ESOPHAGOGASTRODUODENOSCOPY (EGD);  Surgeon: Missy Sabins, MD;  Location: [facility] ENDOSCOPY;  Service: Endoscopy;  Laterality: N/A;   Family History  Problem Relation Age of Onset  . Clotting disorder Mother   . Clotting disorder Brother   . Clotting disorder Brother   . Clotting disorder Brother   . Clotting disorder Other     Niece.   History  Substance Use Topics  . Smoking status: Never Smoker   .  Smokeless tobacco: Never Used  . Alcohol Use: No    Review of Systems  Constitutional: Negative for fever.  Respiratory: Positive for cough and shortness of breath.   Cardiovascular: Negative for chest pain.  Gastrointestinal: Positive for nausea. Negative for vomiting and abdominal pain.  Neurological: Positive for light-headedness and headaches. Negative for syncope.  All other systems reviewed and are negative.   Allergies  Review of patient's allergies indicates no known allergies.  Home Medications   Prior to Admission medications   Medication Sig Start Date End Date Taking? Authorizing Provider  amLODipine (NORVASC) 5 MG tablet Take 5 mg by mouth daily.  12/05/12  Yes Historical Provider, MD  carvedilol (COREG) 25 MG tablet TAKE 1 TABLET TWICE A DAY WITH MEALS 07/25/13  Yes Thayer Headings, MD  Cholecalciferol (VITAMIN D3 PO) Take 1,000 capsules by mouth daily.    Yes Historical Provider, MD  furosemide (LASIX) 40 MG tablet Take 1 tablet (40 mg total) by mouth 2 (two) times daily. 05/07/14  Yes Rogelia Mire, NP  gabapentin (NEURONTIN) 600 MG tablet Take 600 mg by mouth daily.   Yes Historical Provider, MD  hydrocortisone (CORTEF) 20 MG tablet Take 20 mg by mouth 3 (three) times daily.    Yes Historical Provider, MD  lisinopril (PRINIVIL,ZESTRIL) 10 MG tablet TAKE 1 TABLET DAILY (NEEDS TO CONTACT OFFICE TO SCHEDULE APPOINTMENT FOR FUTURE REFILLS)   Yes Thayer Headings, MD  metoCLOPramide (REGLAN) 5 MG tablet Take 5 mg by mouth daily.    Yes Historical Provider, MD  potassium chloride (K-DUR,KLOR-CON) 10 MEQ tablet Take 1 tablet (10 mEq total) by mouth 2 (two) times daily. 09/14/13  Yes Thayer Headings, MD  PROAIR HFA 108 (90 BASE) MCG/ACT inhaler INHALE 2 PUFFS INTO THE LUNGS EVERY 6 (SIX) HOURS AS NEEDED FOR WHEEZING OR SHORTNESS OF BREATH. 01/16/14  Yes Collene Gobble, MD  thyroid (ARMOUR) 60 MG tablet Take 60 mg by mouth daily.    Yes Historical Provider, MD  traMADol  (ULTRAM) 50 MG tablet Take 50 mg by mouth 3 (three) times daily.    Yes Historical Provider, MD  traZODone (DESYREL) 100 MG tablet Take 50-100 mg by mouth every evening.  04/16/14  Yes Historical Provider, MD   BP 143/77 mmHg  Pulse 75  Temp(Src) 98.6 F (37 C)  Resp 13  Ht 5\' 10"  (1.778 m)  Wt 204 lb (92.534 kg)  BMI 29.27 kg/m2  SpO2 97%   Physical Exam  Constitutional: He is oriented to person, place, and time. He appears well-developed and well-nourished. No distress.  Nontoxic/nonseptic appearing. Pleasant.  HENT:  Head: Normocephalic and atraumatic.  Eyes: Conjunctivae and EOM are normal. No scleral icterus.  Neck: Normal range of motion.  No JVD  Cardiovascular: Normal rate, regular rhythm and intact distal pulses.   Pulmonary/Chest: Effort normal and breath sounds normal. No respiratory distress. He has  no wheezes. He has no rales.  Lungs CTAB. Patient appears dyspneic. No tachypnea. O2 sats 96% on 2L [place]. He states he uses O2 via [place] at home PRN at nighttime.  Musculoskeletal: Normal range of motion. He exhibits no edema.  No b/l lower extremity pitting edema.  Neurological: He is alert and oriented to person, place, and time. He exhibits normal muscle tone. Coordination normal.  GCS 15. Speech is goal oriented.  Skin: Skin is warm and dry. No rash noted. He is not diaphoretic. No erythema. No pallor.  Psychiatric: He has a normal mood and affect. His behavior is normal.  Nursing note and vitals reviewed.   ED Course  Procedures (including critical care time) Labs Review Labs Reviewed  BASIC METABOLIC PANEL - Abnormal; Notable for the following:    Glucose, Bld 176 (*)    BUN 26 (*)    Creatinine, Ser 1.88 (*)    GFR calc non Af Amer 34 (*)    GFR calc Af Amer 39 (*)    All other components within normal limits  CBC WITH DIFFERENTIAL/PLATELET - Abnormal; Notable for the following:    WBC 13.3 (*)    RBC 4.11 (*)    Hemoglobin 12.5 (*)    HCT 37.8 (*)    RDW 15.9  (*)    Neutrophils Relative % 78 (*)    Neutro Abs 10.4 (*)    All other components within normal limits  D-DIMER, QUANTITATIVE - Abnormal; Notable for the following:    D-Dimer, Quant 4.80 (*)    All other components within normal limits  BRAIN NATRIURETIC PEPTIDE  TROPONIN I    Imaging Review Ct Angio Chest Pe W/cm &/or Wo Cm  05/11/2014   CLINICAL DATA:  Shortness of breath, elevated D-dimer. Dyspnea on exertion. History of DVT.  EXAM: CT ANGIOGRAPHY CHEST WITH CONTRAST  TECHNIQUE: Multidetector CT imaging of the chest was performed using the standard protocol during bolus administration of intravenous contrast. Multiplanar CT image reconstructions and MIPs were obtained to evaluate the vascular anatomy.  CONTRAST:  16mL OMNIPAQUE IOHEXOL 350 MG/ML SOLN  COMPARISON:  Chest radiograph earlier this date.  FINDINGS: There is filling defects within the right and left pulmonary arterial system consistent pulmonary embolus. On the left this involves the left lower lobe pulmonary artery extending into the subsegmental branches primarily posterior basal. Thrombus extends into the left upper lobe and lingular segmental arteries. On the right there is thrombus in the right upper lobe pulmonary artery extending into the segmental and subsegmental branches, right middle lobe segmental and subsegmental branches, and subsegmental branches of the basilar lower lobes. There is right heart strain with a RV to LV ratio of 1.4.  The thoracic aorta is normal in caliber with mild atherosclerosis. Coronary artery calcifications are seen. There is no pericardial or pleural effusion. No definite pulmonary infarct. There peripheral linear opacities in the lower lobes, lingula, and right middle lobes, likely atelectasis.  Evaluation of the upper abdomen demonstrates splenic granuloma. Postcholecystectomy clips noted.  There are no acute or suspicious osseous abnormalities.  Review of the MIP images confirms the above  findings.  IMPRESSION: Positive for bilateral acute PE with CT evidence of right heart strain (RV/LV Ratio = 1.0) consistent with at least submassive (intermediate risk) PE. The presence of right heart strain has been associated with an increased risk of morbidity and mortality. Please activate Code PE by paging [phone].  Critical Value/emergent results were called by telephone at the time of  interpretation on 05/11/2014 at 2:29 am to [facility] , who verbally acknowledged these results.   Electronically Signed   By: Jeb Levering M.D.   On: 05/11/2014 02:29   Dg Chest Port 1 View  05/11/2014   CLINICAL DATA:  Shortness of breath.  EXAM: PORTABLE CHEST - 1 VIEW  COMPARISON:  06/22/2013  FINDINGS: Cardiomediastinal contours are unchanged allowing for differences in technique. There is minimal right greater than left basilar atelectasis or scarring. Pulmonary vasculature is normal. No consolidation, pleural effusion, or pneumothorax. No acute osseous abnormalities are seen.  IMPRESSION: Mild right greater than left bibasilar atelectasis or scarring.   Electronically Signed   By: Jeb Levering M.D.   On: 05/11/2014 00:48     EKG Interpretation   Date/Time:  Friday May 10 2014 23:42:22 EDT Ventricular Rate:  85 PR Interval:  140 QRS Duration: 92 QT Interval:  372 QTC Calculation: 442 R Axis:   35 Text Interpretation:  Normal sinus rhythm Nonspecific ST abnormality  Abnormal ECG since last tracing no significant change Confirmed by Eulis Foster   MD, ELLIOTT ([id number]) on 05/11/2014 2:38:37 AM      CRITICAL CARE Performed by: Antonietta Breach   Total critical care time: 40  Critical care time was exclusive of separately billable procedures and treating other patients.  Critical care was necessary to treat or prevent imminent or life-threatening deterioration.  Critical care was time spent personally by me on the following activities: development of treatment plan with patient and/or  surrogate as well as nursing, discussions with consultants, evaluation of patient's response to treatment, examination of patient, obtaining history from patient or surrogate, ordering and performing treatments and interventions, ordering and review of laboratory studies, ordering and review of radiographic studies, pulse oximetry and re-evaluation of patient's condition.  MDM   Final diagnoses:  Shortness of breath  DOE (dyspnea on exertion)  Pulmonary embolus    74 year old male presented to the emergency department today for further evaluation of dyspnea on exertion. He had a positive d-dimer prior to arrival. Chest CT shows bilateral acute PE with CT evidence of right heart strain. No physiologic evidence of heart strain. Troponin and BNP are negative. Patient has no tachycardia or tachypnea at rest. No hypoxia. He is satting well on 2-3 L O2 via [place]; he is on O2 PRN at baseline for CHF.  Patient has a history of b/l DVT in 02/2013. He was placed on Xarelto for this which was discontinued approximately 4 weeks later secondary to acute GI bleed. Case discussed with Dr. Jimmy Footman of critical care and Dr. Jacqualyn Posey of IR, both of whom agree that patient is stable for Step Down and that there is no indication for emergent thrombolysis. They will consult on this patient's case in the AM. Heparin therapy initiated. Will consult with [facility] for admission. Would also recommend completion of b/l venous duplex in the AM to evaluate for DVT given aforementioned history.  Dr. Ernestina Patches of [facility] to evaluate the patient for admission to Step Down.   Filed Vitals:   05/11/14 0115 05/11/14 0200 05/11/14 0215 05/11/14 0230  BP: 145/92 138/85 143/77 140/79  Pulse: 79 80 75 75  Temp:      Resp: 15 13 13 13   Height:      Weight:      SpO2: 95% 97% 97% 97%     Antonietta Breach, PA-C 05/11/14 0326  Antonietta Breach, PA-C 05/11/14 [place], MD 05/11/14 [phone]

## 2014-05-11 NOTE — Progress Notes (Signed)
[facility] PROGRESS NOTE  [name] QJJ:[id number] DOB: [birth year] DOA: 05/10/2014 PCP:  Melinda Crutch, MD  Same Day Note  Assessment/Plan: 1. Submassive B PE 1. Pt is continued on heparin GTT 2. Critical Care and IR both consulted through ED on admission overnight 3. Plan to continue systemic heparin per IR 4. Cont to monitor in stepdown 5. LE dopplers ordered, pending 2. Diastolic CHF 1. Clinically euvolemic 2. Monitor 3. Recent 2d echo results pending 3. Addison's disease 1. Stable 2. Pt is continued on flourinef 4. Stage 3 CKD 1. Cr near baseline 2. Cont to monitor 5. Hx significant lower GI bleed 1. hgb stable overnight 2. No obvious signs of bleeding 3. Monitor closely 6. Hypothyroid 1. Pt is continued on thyroid replacement 2. Most recent TSH documented from 2010 3. Will repeat TSH 7. Hypokalemia 1. Replaced this AM 2. Continue to follow lytes and correct as needed 8. HTN 1. BP stable  2. Cont current regimen  Code Status: Full Family Communication: Pt in room (indicate person spoken with, relationship, and if by phone, the number) Disposition Plan: Pending   Consultants:  IR  Critical Care  Procedures:    Antibiotics:   (indicate start date, and stop date if known)  HPI/Subjective: Feels well. Eager to go home  Objective: Filed Vitals:   05/11/14 0600 05/11/14 0700 05/11/14 0752 05/11/14 0753  BP:   152/86   Pulse: 79 80 73   Temp:    98.2 F (36.8 C)  TempSrc:    Oral  Resp: 14 13 12    Height:      Weight:      SpO2: 98% 95% 98%     Intake/Output Summary (Last 24 hours) at 05/11/14 1447 Last data filed at 05/11/14 0941  Gross per 24 hour  Intake    984 ml  Output    450 ml  Net    534 ml   Filed Weights   05/10/14 2340 05/11/14 0526  Weight: 92.534 kg (204 lb) 95.4 kg (210 lb 5.1 oz)    Exam:   General:  Awake, in nad  Cardiovascular: regular, s1, s2  Respiratory: normal resp effort, no  wheezing  Abdomen: soft,nondistended  Musculoskeletal: perfused, no clubbing   Data Reviewed: Basic Metabolic Panel:  Recent Labs Lab 05/07/14 1619 05/10/14 2348 05/11/14 0354  NA 141 141 141  K 4.3 3.5 2.9*  CL 105 103 104  CO2 26 24 22   GLUCOSE 119* 176* 103*  BUN 26* 26* 24*  CREATININE 1.64* 1.88* 1.65*  CALCIUM 8.9 9.0 8.4  MG  --   --  2.1   Liver Function Tests:  Recent Labs Lab 05/11/14 0354  AST 12  ALT 16  ALKPHOS 50  BILITOT 0.7  PROT 6.0  ALBUMIN 3.2*   No results for input(s): LIPASE, AMYLASE in the last 168 hours. No results for input(s): AMMONIA in the last 168 hours. CBC:  Recent Labs Lab 05/10/14 2348 05/11/14 0354  WBC 13.3* 12.3*  NEUTROABS 10.4* 8.2*  HGB 12.5* 11.6*  HCT 37.8* 35.0*  MCV 92.0 90.4  PLT 225 175   Cardiac Enzymes:  Recent Labs Lab 05/10/14 2348 05/11/14 0354 05/11/14 0924  TROPONINI <0.03 <0.03 <0.03   BNP (last 3 results)  Recent Labs  05/10/14 2348  BNP 26.9    ProBNP (last 3 results)  Recent Labs  05/11/13 1521 05/10/14 1405  PROBNP 95.0 24.0    CBG:  Recent Labs Lab 05/11/14 0432 05/11/14 0752  05/11/14 1139  GLUCAP 95 88 101*    Recent Results (from the past 240 hour(s))  MRSA PCR Screening     Status: None   Collection Time: 05/11/14  5:37 AM  Result Value Ref Range Status   MRSA by PCR NEGATIVE NEGATIVE Final    Comment:        The GeneXpert MRSA Assay (FDA approved for NASAL specimens only), is one component of a comprehensive MRSA colonization surveillance program. It is not intended to diagnose MRSA infection nor to guide or monitor treatment for MRSA infections.      Studies: Ct Angio Chest Pe W/cm &/or Wo Cm  05/11/2014   CLINICAL DATA:  Shortness of breath, elevated D-dimer. Dyspnea on exertion. History of DVT.  EXAM: CT ANGIOGRAPHY CHEST WITH CONTRAST  TECHNIQUE: Multidetector CT imaging of the chest was performed using the standard protocol during bolus  administration of intravenous contrast. Multiplanar CT image reconstructions and MIPs were obtained to evaluate the vascular anatomy.  CONTRAST:  31mL OMNIPAQUE IOHEXOL 350 MG/ML SOLN  COMPARISON:  Chest radiograph earlier this date.  FINDINGS: There is filling defects within the right and left pulmonary arterial system consistent pulmonary embolus. On the left this involves the left lower lobe pulmonary artery extending into the subsegmental branches primarily posterior basal. Thrombus extends into the left upper lobe and lingular segmental arteries. On the right there is thrombus in the right upper lobe pulmonary artery extending into the segmental and subsegmental branches, right middle lobe segmental and subsegmental branches, and subsegmental branches of the basilar lower lobes. There is right heart strain with a RV to LV ratio of 1.4.  The thoracic aorta is normal in caliber with mild atherosclerosis. Coronary artery calcifications are seen. There is no pericardial or pleural effusion. No definite pulmonary infarct. There peripheral linear opacities in the lower lobes, lingula, and right middle lobes, likely atelectasis.  Evaluation of the upper abdomen demonstrates splenic granuloma. Postcholecystectomy clips noted.  There are no acute or suspicious osseous abnormalities.  Review of the MIP images confirms the above findings.  IMPRESSION: Positive for bilateral acute PE with CT evidence of right heart strain (RV/LV Ratio = 1.0) consistent with at least submassive (intermediate risk) PE. The presence of right heart strain has been associated with an increased risk of morbidity and mortality. Please activate Code PE by paging [phone].  Critical Value/emergent results were called by telephone at the time of interpretation on 05/11/2014 at 2:29 am to [place] , who verbally acknowledged these results.   Electronically Signed   By: Jeb Levering M.D.   On: 05/11/2014 02:29   Dg Chest Port 1  View  05/11/2014   CLINICAL DATA:  Shortness of breath.  EXAM: PORTABLE CHEST - 1 VIEW  COMPARISON:  06/22/2013  FINDINGS: Cardiomediastinal contours are unchanged allowing for differences in technique. There is minimal right greater than left basilar atelectasis or scarring. Pulmonary vasculature is normal. No consolidation, pleural effusion, or pneumothorax. No acute osseous abnormalities are seen.  IMPRESSION: Mild right greater than left bibasilar atelectasis or scarring.   Electronically Signed   By: Jeb Levering M.D.   On: 05/11/2014 00:48    Scheduled Meds: . amLODipine  5 mg Oral Daily  . carvedilol  25 mg Oral BID WC  . hydrocortisone  20 mg Oral TID WC  . insulin aspart  0-9 Units Subcutaneous 6 times per day  . metoCLOPramide  5 mg Oral Q breakfast  . sodium chloride  3  mL Intravenous Q12H  . thyroid  60 mg Oral Daily  . traMADol  50 mg Oral TID  . traZODone  50-100 mg Oral QPM   Continuous Infusions: . sodium chloride 75 mL/hr at 05/11/14 0941  . heparin 1,200 Units/hr (05/11/14 0320)    Active Problems:   Hypothyroidism   ADDISON'S ANEMIA   Pulmonary embolus   [facility], [place] [facility] Pager [phone]. If 7PM-7AM, please contact night-coverage at [email or web address], password [facility] 05/11/2014, 2:47 PM  LOS: 0 days

## 2014-05-11 NOTE — ED Notes (Signed)
Called Dr. Ernestina Patches to report potassium change from 3.5 to 2.9, and kidney disease. MD acknowledges, gives verbal order for 2 runs of K+ and to add magnesium level.

## 2014-05-11 NOTE — Progress Notes (Signed)
ANTICOAGULATION CONSULT NOTE - Initial Consult  Pharmacy Consult for Heparin  Indication: pulmonary embolus  No Known Allergies  Patient Measurements: Height: 5\' 10"  (177.8 cm) Weight: 204 lb (92.534 kg) IBW/kg (Calculated) : 73  Vital Signs: Temp: 98.6 F (37 C) (04/15 2340) BP: 140/79 mmHg (04/16 0230) Pulse Rate: 75 (04/16 0230)  Labs:  Recent Labs  05/10/14 2348  HGB 12.5*  HCT 37.8*  PLT 225  CREATININE 1.88*  TROPONINI <0.03    Estimated Creatinine Clearance: 40 mL/min (by C-G formula based on Cr of 1.88).  Medical History: Past Medical History  Diagnosis Date  . Chronic diastolic CHF (congestive heart failure)     a. His initial ejection fraction was between 30 and 35%;  b. 04/2013 Echo: EF 50-55% Gr1 DD, mild-mod MR.  . Thyroid disease   . Pulmonary HTN   . Hypertension   . Addison's disease   . Hypogonadism male   . CKD (chronic kidney disease), stage III   . History of aortic insufficiency   . Hx of cardiovascular stress test     a. ETT-Myoview 7/14:  Low risk, inferior defect consistent with thinning, no ischemia, normal wall motion, EF 54%  . DVT (deep venous thrombosis) ~ 02/2013    BLE  . GERD (gastroesophageal reflux disease)   . H/O hiatal hernia   . Hepatitis 1959    "think it was a form of C"   . Daily headache   . Chronic back pain     "neck to tailbone" (04/11/2013)  . Peptic ulcer disease     suspected 04/11/2013  . On home oxygen therapy     "2-3L @ night" (04/11/2013)  . Acute lower GI bleeding     admitted to [facility] 04/11/2013   Assessment: 74 y/o M who has been short of breath for weeks, elevated D-dimer, CT Angio with large PE, pt with recent history of bilateral DVT's, but anti-coagulation was STOPPED due to GI BLEED, Hgb 12.5, PLTS 225, noted renal dysfunction.   Goal of Therapy:  Heparin level 0.3-0.7 units/ml Monitor platelets by anticoagulation protocol: Yes   Plan:  -Heparin 4500 units BOLUS -Start heparin drip at 1200  units/hr -1200 HL -Daily CBC/HL -Monitor for bleeding, trend Hgb  Narda Bonds 05/11/2014,2:53 AM

## 2014-05-11 NOTE — Progress Notes (Signed)
Referring Physician(s): Dr. Ernestina Patches  Subjective: 74 yo male with hx of CHF, CKD, and prior hx of Bilat DVT. Was placed on Xarelto but after about 4 weeks developed GI bleed felt secondary to ulcer disease from NSAID use and the Xarelto. He has not been on anticoagulation since that time last year. Pt has reported recent SOB and was worked up by primary cardiologist. Durene Cal to ER for elevated D-dimer and found to have submassive bilateral PE. Right heart strain noted on CT but Echo reported as normal. Pt has been admitted and has been on IV heparin gtt. Feels better since last pm. Pt reports intermittent LE edema as he has chronic LE venous insufficiency. Currently denies any LE swelling. 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 IR is asked to eval pt for possible PE lysis. Chart, PMHx, meds, labs, and imaging reviewed with Dr. Earleen Newport  Past Medical History  Diagnosis Date  . Chronic diastolic CHF (congestive heart failure)     a. His initial ejection fraction was between 30 and 35%;  b. 04/2013 Echo: EF 50-55% Gr1 DD, mild-mod MR.  . Thyroid disease   . Pulmonary HTN   . Hypertension   . Addison's disease   . Hypogonadism male   . CKD (chronic kidney disease), stage III   . History of aortic insufficiency   . Hx of cardiovascular stress test     a. ETT-Myoview 7/14:  Low risk, inferior defect consistent with thinning, no ischemia, normal wall motion, EF 54%  . DVT (deep venous thrombosis) ~ 02/2013    BLE  . GERD (gastroesophageal reflux disease)   . H/O hiatal hernia   . Hepatitis 1959    "think it was a form of C"   . Daily headache   . Chronic back pain     "neck to tailbone" (04/11/2013)  . Peptic ulcer disease     suspected 04/11/2013  . On home oxygen therapy     "2-3L @ night" (04/11/2013)  . Acute lower GI bleeding     admitted to [facility] 04/11/2013   Past Surgical History  Procedure Laterality Date  . Cardiac catheterization   06/26/2008    EF 30-35%  . US echocardiography  04/09/2010    EF 60-65%  . Tonsillectomy  1940's  . Appendectomy  1950's  . Cholecystectomy  ~ 1965  . Esophagogastroduodenoscopy N/A 04/11/2013    Procedure: ESOPHAGOGASTRODUODENOSCOPY (EGD);  Surgeon: Missy Sabins, MD;  Location: [facility] ENDOSCOPY;  Service: Endoscopy;  Laterality: N/A;   History   Social History  . Marital Status: Married    Spouse Name: N/A  . Number of Children: N/A  . Years of Education: N/A   Occupational History  . retired    Social History Main Topics  . Smoking status: Never Smoker   . Smokeless tobacco: Never Used  . Alcohol Use: No  . Drug Use: No  . Sexual Activity: Not Currently   Other Topics Concern  . Not on file   Social History Narrative    Allergies: Review of patient's allergies indicates no known allergies.  Medications:  Current facility-administered medications:  .  0.9 %  sodium chloride infusion, , Intravenous, Continuous, Deneise Lever, MD, Last Rate: 75 mL/hr at 05/11/14 0941 .  albuterol (PROVENTIL) (2.5 MG/3ML) 0.083% nebulizer solution 2.5 mg, 2.5 mg, Inhalation, Q4H PRN, Deneise Lever, MD .  amLODipine (NORVASC) tablet 5 mg, 5 mg, Oral, Daily, Deneise Lever,  MD .  carvedilol (COREG) tablet 25 mg, 25 mg, Oral, BID WC, Deneise Lever, MD, 25 mg at 05/11/14 0759 .  heparin ADULT infusion 100 units/mL (25000 units/250 mL), 1,200 Units/hr, Intravenous, Continuous, Erenest Blank, RPH, Last Rate: 12 mL/hr at 05/11/14 0320, 1,200 Units/hr at 05/11/14 0320 .  hydrocortisone (CORTEF) tablet 20 mg, 20 mg, Oral, TID WC, Deneise Lever, MD, 20 mg at 05/11/14 0759 .  insulin aspart (novoLOG) injection 0-9 Units, 0-9 Units, Subcutaneous, 6 times per day, Deneise Lever, MD, 0 Units at 05/11/14 [phone] .  metoCLOPramide (REGLAN) tablet 5 mg, 5 mg, Oral, Q breakfast, Deneise Lever, MD, 5 mg at 05/11/14 0759 .  sodium chloride 0.9 % injection 3 mL, 3 mL, Intravenous, Q12H, Deneise Lever,  MD, 3 mL at 05/11/14 0433 .  thyroid (ARMOUR) tablet 60 mg, 60 mg, Oral, Daily, Deneise Lever, MD .  traMADol Veatrice Bourbon) tablet 50 mg, 50 mg, Oral, TID, Deneise Lever, MD, 50 mg at 05/11/14 [phone] .  traZODone (DESYREL) tablet 50-100 mg, 50-100 mg, Oral, QPM, Deneise Lever, MD   Review of Systems  Constitutional: Positive for fatigue. Negative for fever, chills and diaphoresis.  HENT: Negative.   Respiratory: Positive for shortness of breath. Negative for cough, chest tightness and stridor.        Mild SOB, has only been OOB to commode.  Cardiovascular: Negative.   Gastrointestinal: Negative.   Genitourinary: Negative.   Musculoskeletal: Negative.   Neurological: Negative.   Psychiatric/Behavioral: Negative.      Vital Signs: BP 152/86 mmHg  Pulse 73  Temp(Src) 98 F (36.7 C) (Oral)  Resp 12  Ht 5\' 10"  (1.778 m)  Wt 210 lb 5.1 oz (95.4 kg)  BMI 30.18 kg/m2  SpO2 98%  Physical Exam  Constitutional: He is oriented to person, place, and time. He appears well-developed and well-nourished. No distress.  HENT:  Head: Normocephalic and atraumatic.  Neck: Normal range of motion. Neck supple. No JVD present. No tracheal deviation present.  Cardiovascular: Normal rate, regular rhythm, normal heart sounds and intact distal pulses.   Pulmonary/Chest: Breath sounds normal. No respiratory distress. He has no wheezes.  Does not appear to have any increased work of breathing  Abdominal: Soft. Bowel sounds are normal. He exhibits no distension.  Musculoskeletal: Normal range of motion.  Trace edema RLE, no edema on left No calf or thigh tenderness  Neurological: He is alert and oriented to person, place, and time.  Skin: He is not diaphoretic.  Psychiatric: He has a normal mood and affect. Judgment normal.    Imaging: Ct Angio Chest Pe W/cm &/or Wo Cm  05/11/2014   CLINICAL DATA:  Shortness of breath, elevated D-dimer. Dyspnea on exertion. History of DVT.  EXAM: CT ANGIOGRAPHY CHEST  WITH CONTRAST  TECHNIQUE: Multidetector CT imaging of the chest was performed using the standard protocol during bolus administration of intravenous contrast. Multiplanar CT image reconstructions and MIPs were obtained to evaluate the vascular anatomy.  CONTRAST:  11mL OMNIPAQUE IOHEXOL 350 MG/ML SOLN  COMPARISON:  Chest radiograph earlier this date.  FINDINGS: There is filling defects within the right and left pulmonary arterial system consistent pulmonary embolus. On the left this involves the left lower lobe pulmonary artery extending into the subsegmental branches primarily posterior basal. Thrombus extends into the left upper lobe and lingular segmental arteries. On the right there is thrombus in the right upper lobe pulmonary artery extending into the segmental and subsegmental branches,  right middle lobe segmental and subsegmental branches, and subsegmental branches of the basilar lower lobes. There is right heart strain with a RV to LV ratio of 1.4.  The thoracic aorta is normal in caliber with mild atherosclerosis. Coronary artery calcifications are seen. There is no pericardial or pleural effusion. No definite pulmonary infarct. There peripheral linear opacities in the lower lobes, lingula, and right middle lobes, likely atelectasis.  Evaluation of the upper abdomen demonstrates splenic granuloma. Postcholecystectomy clips noted.  There are no acute or suspicious osseous abnormalities.  Review of the MIP images confirms the above findings.  IMPRESSION: Positive for bilateral acute PE with CT evidence of right heart strain (RV/LV Ratio = 1.0) consistent with at least submassive (intermediate risk) PE. The presence of right heart strain has been associated with an increased risk of morbidity and mortality. Please activate Code PE by paging [phone].  Critical Value/emergent results were called by telephone at the time of interpretation on 05/11/2014 at 2:29 am to [place] , who verbally acknowledged  these results.   Electronically Signed   By: Jeb Levering M.D.   On: 05/11/2014 02:29   Dg Chest Port 1 View  05/11/2014   CLINICAL DATA:  Shortness of breath.  EXAM: PORTABLE CHEST - 1 VIEW  COMPARISON:  06/22/2013  FINDINGS: Cardiomediastinal contours are unchanged allowing for differences in technique. There is minimal right greater than left basilar atelectasis or scarring. Pulmonary vasculature is normal. No consolidation, pleural effusion, or pneumothorax. No acute osseous abnormalities are seen.  IMPRESSION: Mild right greater than left bibasilar atelectasis or scarring.   Electronically Signed   By: Jeb Levering M.D.   On: 05/11/2014 00:48    Labs:  CBC:  Recent Labs  05/11/13 1521 05/18/13 1207 05/10/14 2348 05/11/14 0354  WBC 12.5* 13.0* 13.3* 12.3*  HGB 9.8* 9.8* 12.5* 11.6*  HCT 30.0* 30.8* 37.8* 35.0*  PLT 302.0 339.0 225 175    BMP:  Recent Labs  05/18/13 1207 05/07/14 1619 05/10/14 2348 05/11/14 0354  NA 140 141 141 141  K 3.1* 4.3 3.5 2.9*  CL 106 105 103 104  CO2 25 26 24 22   GLUCOSE 97 119* 176* 103*  BUN 29* 26* 26* 24*  CALCIUM 9.4 8.9 9.0 8.4  CREATININE 1.8* 1.64* 1.88* 1.65*  GFRNONAA  --   --  34* 40*  GFRAA  --   --  39* 46*    LIVER FUNCTION TESTS:  Recent Labs  05/11/14 0354  BILITOT 0.7  AST 12  ALT 16  ALKPHOS 50  PROT 6.0  ALBUMIN 3.2*   Troponin I: <0.3 BNP: 26.9  Assessment and Plan: Bilateral submassive PE Right heart strain appreciated on CT, but not on Echo No troponin leak and BNP low Pt symptomatically better since admission and heparin. Prior hx of GIB when on Xarelto At this point, do not feel PE lysis would be necessary or improve clinical status. Procedure was discussed with pt and his brother in detail, including the risks and complications ,especially potential for recurrent GI bleeding. LE venous duplex yet to be done. Clinically do not suspect LE DVT. However, pt may be a candidate for IVC filter if  concern for worsening clot burden. IVC filter procedure was also explained to pt and brother in detail, but not known if will be necessary at this stage. Continue IV heparin, need to decide if pt should be tried on po anticoagulation again. EGD from 03/2013 showed small prepyloric ulcers, pt no  longer on NSAIDs.     I spent a total of 40 minutes face to face in clinical consultation/evaluation, greater than 50% of which was counseling/coordinating care for Bilateral pulmonary embolus.  SignedAscencion Dike 05/11/2014, 10:53 AM

## 2014-05-11 NOTE — ED Notes (Signed)
Discussed plan of care with the patient. Called x-ray to inform patient is available for portable, then patient will travel for CTA after speaking with Claiborne Billings, [place].

## 2014-05-11 NOTE — ED Notes (Signed)
Claiborne Billings, PA-C, at the bedside. Portable x-ray arrived.

## 2014-05-11 NOTE — ED Provider Notes (Signed)
  Face-to-face evaluation   History: Patient here for evaluation of elevated d-dimer. He has been short of breath for 1 month. He saw his cardiologist today, at the time had a cardiac echo.  Physical exam: Alert, calm, cooperative. Lungs clear to auscultation. No wheezes, rales or rhonchi. Legs with bilateral lower extremity edema, right greater than left.  Medical screening examination/treatment/procedure(s) were conducted as a shared visit with non-physician practitioner(s) and myself.  I personally evaluated the patient during the encounter  Daleen Bo, MD 05/11/14 [phone]

## 2014-05-11 NOTE — ED Notes (Signed)
Claiborne Billings, PA-C, at the bedside.

## 2014-05-11 NOTE — Progress Notes (Addendum)
ANTICOAGULATION CONSULT NOTE  Pharmacy Consult for Heparin  Indication: pulmonary embolus  No Known Allergies  Patient Measurements: Height: 5\' 10"  (177.8 cm) Weight: 210 lb 5.1 oz (95.4 kg) IBW/kg (Calculated) : 73  Vital Signs: Temp: 98.2 F (36.8 C) (04/16 0753) Temp Source: Oral (04/16 0753) BP: 152/86 mmHg (04/16 0752) Pulse Rate: 73 (04/16 0752)  Labs:  Recent Labs  05/10/14 2348 05/11/14 0354 05/11/14 0924 05/11/14 1120  HGB 12.5* 11.6*  --   --   HCT 37.8* 35.0*  --   --   PLT 225 175  --   --   HEPARINUNFRC  --   --   --  0.59  CREATININE 1.88* 1.65*  --   --   TROPONINI <0.03 <0.03 <0.03  --     Estimated Creatinine Clearance: 46.2 mL/min (by C-G formula based on Cr of 1.65).  Assessment: 74 y/o M who has been short of breath for weeks, elevated D-dimer, CT Angio with bilateral PE with righ heart strain, pt with recent history of bilateral DVT's, but anti-coagulation was STOPPED due to GI BLEED (Xarelto). Pharmacy consulted to dose heparin for new PE.  Heparin level is currently therapeutic at 0.59 on 1200 units/hr. Hgb 11.6, plts 175. No signs of bleeding noted.  Goal of Therapy:  Heparin level 0.3-0.7 units/ml Monitor platelets by anticoagulation protocol: Yes   Plan:  -Continue heparin drip at 1200 units/hr -2000 HL -Daily CBC/HL -Monitor for bleeding, trend Hgb  Theron Arista, PharmD Clinical Pharmacist - Resident Pager: [phone] 4/16/20161:02 PM   ADDENDUM Heparin level this evening remains therapeutic at 0.52 units/mL. No bleeding ntoed.  Plan: -continue heparin at 1200 units/hr -daily HL and CBC  Braylea Brancato D. Jonette Wassel, PharmD, BCPS Clinical Pharmacist Pager: [phone] 05/11/2014 9:03 PM

## 2014-05-11 NOTE — ED Notes (Signed)
Dr. Ernestina Patches, hospitalist, at the bedside.

## 2014-05-12 DIAGNOSIS — I5022 Chronic systolic (congestive) heart failure: Secondary | ICD-10-CM

## 2014-05-12 DIAGNOSIS — N183 Chronic kidney disease, stage 3 (moderate): Secondary | ICD-10-CM

## 2014-05-12 DIAGNOSIS — I2699 Other pulmonary embolism without acute cor pulmonale: Principal | ICD-10-CM

## 2014-05-12 LAB — COMPREHENSIVE METABOLIC PANEL
ALT: 13 U/L (ref 0–53)
AST: 11 U/L (ref 0–37)
Albumin: 2.7 g/dL — ABNORMAL LOW (ref 3.5–5.2)
Alkaline Phosphatase: 44 U/L (ref 39–117)
Anion gap: 9 (ref 5–15)
BUN: 15 mg/dL (ref 6–23)
CO2: 23 mmol/L (ref 19–32)
Calcium: 8 mg/dL — ABNORMAL LOW (ref 8.4–10.5)
Chloride: 107 mmol/L (ref 96–112)
Creatinine, Ser: 1.37 mg/dL — ABNORMAL HIGH (ref 0.50–1.35)
GFR calc Af Amer: 57 mL/min — ABNORMAL LOW (ref >90)
GFR calc non Af Amer: 50 mL/min — ABNORMAL LOW (ref >90)
Glucose, Bld: 97 mg/dL (ref 70–99)
Potassium: 3.4 mmol/L — ABNORMAL LOW (ref 3.5–5.1)
Sodium: 139 mmol/L (ref 135–145)
Total Bilirubin: 0.8 mg/dL (ref 0.3–1.2)
Total Protein: 5.6 g/dL — ABNORMAL LOW (ref 6.0–8.3)

## 2014-05-12 LAB — CBC WITH DIFFERENTIAL/PLATELET
BASOS PCT: 0 % (ref 0–1)
Basophils Absolute: 0 10*3/uL (ref 0.0–0.1)
Eosinophils Absolute: 0.2 10*3/uL (ref 0.0–0.7)
Eosinophils Relative: 1 % (ref 0–5)
HEMATOCRIT: 33.6 % — AB (ref 39.0–52.0)
HEMOGLOBIN: 11 g/dL — AB (ref 13.0–17.0)
LYMPHS ABS: 2.5 10*3/uL (ref 0.7–4.0)
Lymphocytes Relative: 21 % (ref 12–46)
MCH: 29.8 pg (ref 26.0–34.0)
MCHC: 32.7 g/dL (ref 30.0–36.0)
MCV: 91.1 fL (ref 78.0–100.0)
MONOS PCT: 10 % (ref 3–12)
Monocytes Absolute: 1.2 10*3/uL — ABNORMAL HIGH (ref 0.1–1.0)
Neutro Abs: 8.2 10*3/uL — ABNORMAL HIGH (ref 1.7–7.7)
Neutrophils Relative %: 68 % (ref 43–77)
PLATELETS: 188 10*3/uL (ref 150–400)
RBC: 3.69 MIL/uL — AB (ref 4.22–5.81)
RDW: 15.6 % — ABNORMAL HIGH (ref 11.5–15.5)
WBC: 12.1 10*3/uL — AB (ref 4.0–10.5)

## 2014-05-12 LAB — GLUCOSE, CAPILLARY
GLUCOSE-CAPILLARY: 137 mg/dL — AB (ref 70–99)
GLUCOSE-CAPILLARY: 79 mg/dL (ref 70–99)
GLUCOSE-CAPILLARY: 92 mg/dL (ref 70–99)
Glucose-Capillary: 140 mg/dL — ABNORMAL HIGH (ref 70–99)
Glucose-Capillary: 171 mg/dL — ABNORMAL HIGH (ref 70–99)
Glucose-Capillary: 85 mg/dL (ref 70–99)
Glucose-Capillary: 90 mg/dL (ref 70–99)

## 2014-05-12 LAB — HEPARIN LEVEL (UNFRACTIONATED): Heparin Unfractionated: 0.47 IU/mL (ref 0.30–0.70)

## 2014-05-12 LAB — PROTIME-INR
INR: 1.15 (ref 0.00–1.49)
PROTHROMBIN TIME: 14.9 s (ref 11.6–15.2)

## 2014-05-12 MED ORDER — COUMADIN BOOK
Freq: Once | Status: AC
  Administered 2014-05-12: 21:00:00
  Filled 2014-05-12: qty 1

## 2014-05-12 MED ORDER — PANTOPRAZOLE SODIUM 40 MG PO TBEC
40.0000 mg | DELAYED_RELEASE_TABLET | Freq: Every day | ORAL | Status: DC
  Administered 2014-05-12: 40 mg via ORAL
  Filled 2014-05-12: qty 1

## 2014-05-12 MED ORDER — WARFARIN - PHARMACIST DOSING INPATIENT
Freq: Every day | Status: DC
  Administered 2014-05-12: 19:00:00

## 2014-05-12 MED ORDER — WARFARIN VIDEO
Freq: Once | Status: AC
  Administered 2014-05-13: 07:00:00

## 2014-05-12 MED ORDER — WARFARIN - PHYSICIAN DOSING INPATIENT: Freq: Every day | Status: DC

## 2014-05-12 MED ORDER — ACETAMINOPHEN 325 MG PO TABS
650.0000 mg | ORAL_TABLET | Freq: Four times a day (QID) | ORAL | Status: DC | PRN
  Administered 2014-05-12: 650 mg via ORAL
  Filled 2014-05-12: qty 2

## 2014-05-12 MED ORDER — FAMOTIDINE 20 MG PO TABS
20.0000 mg | ORAL_TABLET | Freq: Two times a day (BID) | ORAL | Status: DC
Start: 2014-05-12 — End: 2014-05-13
  Administered 2014-05-12 (×2): 20 mg via ORAL
  Filled 2014-05-12 (×4): qty 1

## 2014-05-12 MED ORDER — WARFARIN SODIUM 7.5 MG PO TABS
7.5000 mg | ORAL_TABLET | Freq: Once | ORAL | Status: AC
  Administered 2014-05-12: 7.5 mg via ORAL
  Filled 2014-05-12: qty 1

## 2014-05-12 NOTE — Progress Notes (Signed)
[facility] PROGRESS NOTE  JHETT FRETWELL ZOX:[id number] DOB: 19-Jan-1941 DOA: 05/10/2014 PCP:  Melinda Crutch, MD  Assessment/Plan: 1. Submassive B PE with acute R LE DVT, L DVT 1. Pt is continued on heparin GTT 2. Critical Care and IR both consulted through ED on admission overnight 3. Plan to continue systemic heparin per IR 4. Discussed case with Pulmonary, ultimately recs to transition to warfarin with lovenox bridge 5. Cont to monitor in stepdown 6. LE dopplers ordered, pending 7. Reports significant family hx of thrombotic disease. When more stable, would have pt follow up closely with Hematology as an outpatient for further eval 2. Diastolic CHF 1. Clinically euvolemic 2. Monitor 3. Recent 2d echo with normal LVEF and grade 1 diastolic dysfunction. Results relayed by Cardiologist on call.  3. Addison's disease 1. Stable 2. Pt is continued on flourinef 4. Stage 3 CKD 1. Cr near baseline 2. Cont to monitor 5. Hx significant lower GI bleed 1. hgb stable overnight 2. No obvious signs of bleeding 3. Monitor closely 4. Discussed case with Pulmonary, recs to consult GI given hx of transfusion dependent UGI bleed one year ago while on therapeutic anticoagulation 5. GI recs noted. Plans for EGD tomorrow 6. Hypothyroid 1. Pt is continued on thyroid replacement 2. Most recent TSH documented from 2010 3. TSH 0.322 7. Hypokalemia 1. Replaced 2. Continue to follow lytes and correct as needed 8. HTN 1. BP stable  2. Cont current regimen  Code Status: Full Family Communication: Pt in room Disposition Plan: Pending   Consultants:  IR  Critical Care  GI  Procedures:    Antibiotics:   (indicate start date, and stop date if known)  HPI/Subjective: No complaints. Wants to go home  Objective: Filed Vitals:   05/12/14 0000 05/12/14 0404 05/12/14 0800 05/12/14 1100  BP:  156/99 153/86   Pulse:  78    Temp:  98.3 F (36.8 C) 98 F (36.7 C) 99.2 F (37.3 C)   TempSrc:  Oral Oral Oral  Resp: 11 14 15    Height:      Weight:      SpO2:  98% 98%     Intake/Output Summary (Last 24 hours) at 05/12/14 1455 Last data filed at 05/12/14 1358  Gross per 24 hour  Intake 2629.5 ml  Output   1750 ml  Net  879.5 ml   Filed Weights   05/10/14 2340 05/11/14 0526  Weight: 92.534 kg (204 lb) 95.4 kg (210 lb 5.1 oz)    Exam:   General:  Awake, laying in bed, in nad  Cardiovascular: regular, s1, s2  Respiratory: normal resp effort, no wheezing  Abdomen: soft,nondistended, obese  Musculoskeletal: perfused, no clubbing   Data Reviewed: Basic Metabolic Panel:  Recent Labs Lab 05/07/14 1619 05/10/14 2348 05/11/14 0354 05/12/14 0300  NA 141 141 141 139  K 4.3 3.5 2.9* 3.4*  CL 105 103 104 107  CO2 26 24 22 23   GLUCOSE 119* 176* 103* 97  BUN 26* 26* 24* 15  CREATININE 1.64* 1.88* 1.65* 1.37*  CALCIUM 8.9 9.0 8.4 8.0*  MG  --   --  2.1  --    Liver Function Tests:  Recent Labs Lab 05/11/14 0354 05/12/14 0300  AST 12 11  ALT 16 13  ALKPHOS 50 44  BILITOT 0.7 0.8  PROT 6.0 5.6*  ALBUMIN 3.2* 2.7*   No results for input(s): LIPASE, AMYLASE in the last 168 hours. No results for input(s): AMMONIA in the last 168  hours. CBC:  Recent Labs Lab 05/10/14 2348 05/11/14 0354 05/12/14 0300  WBC 13.3* 12.3* 12.1*  NEUTROABS 10.4* 8.2* 8.2*  HGB 12.5* 11.6* 11.0*  HCT 37.8* 35.0* 33.6*  MCV 92.0 90.4 91.1  PLT 225 175 188   Cardiac Enzymes:  Recent Labs Lab 05/10/14 2348 05/11/14 0354 05/11/14 0924 05/11/14 1530  TROPONINI <0.03 <0.03 <0.03 0.03   BNP (last 3 results)  Recent Labs  05/10/14 2348  BNP 26.9    ProBNP (last 3 results)  Recent Labs  05/10/14 1405  PROBNP 24.0    CBG:  Recent Labs Lab 05/11/14 1952 05/11/14 2350 05/12/14 0403 05/12/14 0819 05/12/14 1113  GLUCAP 97 137* 92 79 90    Recent Results (from the past 240 hour(s))  MRSA PCR Screening     Status: None   Collection Time:  05/11/14  5:37 AM  Result Value Ref Range Status   MRSA by PCR NEGATIVE NEGATIVE Final    Comment:        The GeneXpert MRSA Assay (FDA approved for NASAL specimens only), is one component of a comprehensive MRSA colonization surveillance program. It is not intended to diagnose MRSA infection nor to guide or monitor treatment for MRSA infections.      Studies: Ct Angio Chest Pe W/cm &/or Wo Cm  05/11/2014   CLINICAL DATA:  Shortness of breath, elevated D-dimer. Dyspnea on exertion. History of DVT.  EXAM: CT ANGIOGRAPHY CHEST WITH CONTRAST  TECHNIQUE: Multidetector CT imaging of the chest was performed using the standard protocol during bolus administration of intravenous contrast. Multiplanar CT image reconstructions and MIPs were obtained to evaluate the vascular anatomy.  CONTRAST:  16mL OMNIPAQUE IOHEXOL 350 MG/ML SOLN  COMPARISON:  Chest radiograph earlier this date.  FINDINGS: There is filling defects within the right and left pulmonary arterial system consistent pulmonary embolus. On the left this involves the left lower lobe pulmonary artery extending into the subsegmental branches primarily posterior basal. Thrombus extends into the left upper lobe and lingular segmental arteries. On the right there is thrombus in the right upper lobe pulmonary artery extending into the segmental and subsegmental branches, right middle lobe segmental and subsegmental branches, and subsegmental branches of the basilar lower lobes. There is right heart strain with a RV to LV ratio of 1.4.  The thoracic aorta is normal in caliber with mild atherosclerosis. Coronary artery calcifications are seen. There is no pericardial or pleural effusion. No definite pulmonary infarct. There peripheral linear opacities in the lower lobes, lingula, and right middle lobes, likely atelectasis.  Evaluation of the upper abdomen demonstrates splenic granuloma. Postcholecystectomy clips noted.  There are no acute or suspicious  osseous abnormalities.  Review of the MIP images confirms the above findings.  IMPRESSION: Positive for bilateral acute PE with CT evidence of right heart strain (RV/LV Ratio = 1.0) consistent with at least submassive (intermediate risk) PE. The presence of right heart strain has been associated with an increased risk of morbidity and mortality. Please activate Code PE by paging [phone].  Critical Value/emergent results were called by telephone at the time of interpretation on 05/11/2014 at 2:29 am to [place] , who verbally acknowledged these results.   Electronically Signed   By: Jeb Levering M.D.   On: 05/11/2014 02:29   Dg Chest Port 1 View  05/11/2014   CLINICAL DATA:  Shortness of breath.  EXAM: PORTABLE CHEST - 1 VIEW  COMPARISON:  06/22/2013  FINDINGS: Cardiomediastinal contours are unchanged allowing for differences  in technique. There is minimal right greater than left basilar atelectasis or scarring. Pulmonary vasculature is normal. No consolidation, pleural effusion, or pneumothorax. No acute osseous abnormalities are seen.  IMPRESSION: Mild right greater than left bibasilar atelectasis or scarring.   Electronically Signed   By: Jeb Levering M.D.   On: 05/11/2014 00:48    Scheduled Meds: . amLODipine  5 mg Oral Daily  . carvedilol  25 mg Oral BID WC  . famotidine  20 mg Oral BID  . hydrocortisone  20 mg Oral TID WC  . insulin aspart  0-9 Units Subcutaneous 6 times per day  . metoCLOPramide  5 mg Oral Q breakfast  . pantoprazole  40 mg Oral Daily  . sodium chloride  3 mL Intravenous Q12H  . thyroid  60 mg Oral Daily  . traMADol  50 mg Oral TID  . traZODone  50-100 mg Oral QPM   Continuous Infusions: . sodium chloride 75 mL/hr at 05/12/14 0160  . heparin 1,200 Units/hr (05/12/14 0500)    Principal Problem:   Pulmonary embolus Active Problems:   Hypothyroidism   ADDISON'S ANEMIA   Chronic systolic CHF (congestive heart failure)   CKD (chronic kidney disease)  stage 3, GFR 30-59 ml/min   Peptic ulcer disease   Adonna Horsley, [facility] Pager [phone]. If 7PM-7AM, please contact night-coverage at [email or web address], password [facility] 05/12/2014, 2:55 PM  LOS: 1 day

## 2014-05-12 NOTE — Consult Note (Addendum)
Name: [name] MRN: [id number] DOB: December 10, 1940    ADMISSION DATE:  05/10/2014 CONSULTATION DATE:  05/12/2014   REFERRING MD :  TRH CHIU  CHIEF COMPLAINT:   Acute on chronic dyspnea  BRIEF PATIENT DESCRIPTION:  74 year old male with acute segmental and subsegmental pulmonary emboli bilateral with prior history of deep venous thrombosis left lower extremity one year previous. Patient admitted for heparinization. Pulmonary was asked to give guidance and input on to treatment plan. Previous history of upper GI bleed in the past due to nonsteroidal anti-inflammatory use (BC powders)  SIGNIFICANT EVENTS    STUDIES:  CT  chest  scan 05/11/2014: Acute bilateral pulmonary emboli seen Echocardiogram 05/10/2014: No significant right heart strain visualized   HISTORY OF PRESENT ILLNESS:   This 74 year old male with prior history of multiple medical problems including Addison's disease, diastolic heart failure, history of deep venous thrombosis left lower extremity, GI bleeding while on Xarelto secondary to associated nonsteroidal use with upper GI ulceration. History of stage IIIc KD aortic insufficiency. Patient presents now with progressive dyspnea on exertion and at rest of the past 3-4 weeks. The patient was initially thought to have an upper respiratory tract infection but despite antibiotics the patient did not improve. Patient was seen back with a primary care physician and sent the patient to cardiology who then did an evaluation for 15/16 which showed d-dimer is markedly elevated and patient was directed from there to the emergency room for further evaluation. Note echocardiogram done at that time showed to be normal. The patient is noted to have had a left lower extremity deep venous thrombosis 1 year ago and had to stop Xarelto secondary to GI bleeding.  In the emergency room a CT angios showed bilateral segmental and subsegmental pulmonary emboli. Venous Doppler ultrasound of  lower extremities is pending. Patient admitted with IV heparin treatment. Pulmonary was asked to see the patient to see if this would be a patient candidate for catheter directed thrombo-lysis.  PAST MEDICAL HISTORY :   has a past medical history of Chronic diastolic CHF (congestive heart failure); Thyroid disease; Pulmonary HTN; Hypertension; Addison's disease; Hypogonadism male; CKD (chronic kidney disease), stage III; History of aortic insufficiency; cardiovascular stress test; DVT (deep venous thrombosis) (~ 02/2013); GERD (gastroesophageal reflux disease); H/O hiatal hernia; Hepatitis (1959); Daily headache; Chronic back pain; Peptic ulcer disease; On home oxygen therapy; and Acute lower GI bleeding.  has past surgical history that includes Cardiac catheterization (06/26/2008); US echocardiography (04/09/2010); Tonsillectomy (1940's); Appendectomy (1950's); Cholecystectomy (~ 1965); and Esophagogastroduodenoscopy (N/A, 04/11/2013). Prior to Admission medications   Medication Sig Start Date End Date Taking? Authorizing Provider  amLODipine (NORVASC) 5 MG tablet Take 5 mg by mouth daily.  12/05/12  Yes Historical Provider, MD  carvedilol (COREG) 25 MG tablet TAKE 1 TABLET TWICE A DAY WITH MEALS 07/25/13  Yes Thayer Headings, MD  Cholecalciferol (VITAMIN D3 PO) Take 1,000 capsules by mouth daily.    Yes Historical Provider, MD  furosemide (LASIX) 40 MG tablet Take 1 tablet (40 mg total) by mouth 2 (two) times daily. 05/07/14  Yes Rogelia Mire, NP  gabapentin (NEURONTIN) 600 MG tablet Take 600 mg by mouth daily.   Yes Historical Provider, MD  hydrocortisone (CORTEF) 20 MG tablet Take 20 mg by mouth 3 (three) times daily.    Yes Historical Provider, MD  lisinopril (PRINIVIL,ZESTRIL) 10 MG tablet TAKE 1 TABLET DAILY (NEEDS TO CONTACT OFFICE TO SCHEDULE APPOINTMENT FOR FUTURE REFILLS)   Yes Wonda Cheng  [name], MD  metoCLOPramide (REGLAN) 5 MG tablet Take 5 mg by mouth daily.    Yes Historical Provider, MD    potassium chloride (K-DUR,KLOR-CON) 10 MEQ tablet Take 1 tablet (10 mEq total) by mouth 2 (two) times daily. 09/14/13  Yes Thayer Headings, MD  PROAIR HFA 108 (90 BASE) MCG/ACT inhaler INHALE 2 PUFFS INTO THE LUNGS EVERY 6 (SIX) HOURS AS NEEDED FOR WHEEZING OR SHORTNESS OF BREATH. 01/16/14  Yes Collene Gobble, MD  thyroid (ARMOUR) 60 MG tablet Take 60 mg by mouth daily.    Yes Historical Provider, MD  traMADol (ULTRAM) 50 MG tablet Take 50 mg by mouth 3 (three) times daily.    Yes Historical Provider, MD  traZODone (DESYREL) 100 MG tablet Take 50-100 mg by mouth every evening.  04/16/14  Yes Historical Provider, MD   No Known Allergies  FAMILY HISTORY:  family history includes Clotting disorder in his brother, brother, brother, mother, and other. SOCIAL HISTORY:  reports that he has never smoked. He has never used smokeless tobacco. He reports that he does not drink alcohol or use illicit drugs.  REVIEW OF SYSTEMS:   Positives are in bold Constitutional: Negative for fever, chills, weight loss, malaise/fatigue and diaphoresis.  HENT: Negative for hearing loss, ear pain, nosebleeds, congestion, sore throat, neck pain, tinnitus and ear discharge.   Eyes: Negative for blurred vision, double vision, photophobia, pain, discharge and redness.  Respiratory: cough, hemoptysis, sputum production, shortness of breath, wheezing and stridor.   Cardiovascular: Negative for chest pain, palpitations, orthopnea, claudication, leg swelling and PND.  Gastrointestinal: Negative for heartburn, nausea, vomiting, abdominal pain, diarrhea, constipation, blood in stool and melena.  Genitourinary: Negative for dysuria, urgency, frequency, hematuria and flank pain.  Musculoskeletal: Negative for myalgias, back pain, joint pain and falls.  Skin: Negative for itching and rash.  Neurological: Negative for dizziness, tingling, tremors, sensory change, speech change, focal weakness, seizures, loss of consciousness,  weakness and headaches.  Endo/Heme/Allergies: Negative for environmental allergies and polydipsia. Does not bruise/bleed easily.  SUBJECTIVE:  The patient is resting comfortably on a heparin drip VITAL SIGNS: Temp:  [98 F (36.7 C)-98.7 F (37.1 C)] 98 F (36.7 C) (04/17 0800) Pulse Rate:  [78-88] 78 (04/17 0404) Resp:  [11-16] 15 (04/17 0800) BP: (120-156)/(72-99) 153/86 mmHg (04/17 0800) SpO2:  [95 %-98 %] 98 % (04/17 0800)  PHYSICAL EXAMINATION: General:  Patient in no acute distress Neuro:  Awake and alert moves all fours HEENT:  No jugular venous distention no lymphadenopathy no thyromegaly Cardiovascular:  Regular rate and rhythm normal S1-S2 no S3 or S4 Lungs:  Clear bilaterally without wheeze rale or rhonchi Abdomen:  Soft nontender bowel sounds active Musculoskeletal:  Full range of motion no joint deformity Skin:  Clear   Recent Labs Lab 05/10/14 2348 05/11/14 0354 05/12/14 0300  NA 141 141 139  K 3.5 2.9* 3.4*  CL 103 104 107  CO2 24 22 23   BUN 26* 24* 15  CREATININE 1.88* 1.65* 1.37*  GLUCOSE 176* 103* 97    Recent Labs Lab 05/10/14 2348 05/11/14 0354 05/12/14 0300  HGB 12.5* 11.6* 11.0*  HCT 37.8* 35.0* 33.6*  WBC 13.3* 12.3* 12.1*  PLT 225 175 188   Ct Angio Chest Pe W/cm &/or Wo Cm  05/11/2014   CLINICAL DATA:  Shortness of breath, elevated D-dimer. Dyspnea on exertion. History of DVT.  EXAM: CT ANGIOGRAPHY CHEST WITH CONTRAST  TECHNIQUE: Multidetector CT imaging of the chest was performed using the standard protocol  during bolus administration of intravenous contrast. Multiplanar CT image reconstructions and MIPs were obtained to evaluate the vascular anatomy.  CONTRAST:  56mL OMNIPAQUE IOHEXOL 350 MG/ML SOLN  COMPARISON:  Chest radiograph earlier this date.  FINDINGS: There is filling defects within the right and left pulmonary arterial system consistent pulmonary embolus. On the left this involves the left lower lobe pulmonary artery extending into  the subsegmental branches primarily posterior basal. Thrombus extends into the left upper lobe and lingular segmental arteries. On the right there is thrombus in the right upper lobe pulmonary artery extending into the segmental and subsegmental branches, right middle lobe segmental and subsegmental branches, and subsegmental branches of the basilar lower lobes. There is right heart strain with a RV to LV ratio of 1.4.  The thoracic aorta is normal in caliber with mild atherosclerosis. Coronary artery calcifications are seen. There is no pericardial or pleural effusion. No definite pulmonary infarct. There peripheral linear opacities in the lower lobes, lingula, and right middle lobes, likely atelectasis.  Evaluation of the upper abdomen demonstrates splenic granuloma. Postcholecystectomy clips noted.  There are no acute or suspicious osseous abnormalities.  Review of the MIP images confirms the above findings.  IMPRESSION: Positive for bilateral acute PE with CT evidence of right heart strain (RV/LV Ratio = 1.0) consistent with at least submassive (intermediate risk) PE. The presence of right heart strain has been associated with an increased risk of morbidity and mortality. Please activate Code PE by paging [phone].  Critical Value/emergent results were called by telephone at the time of interpretation on 05/11/2014 at 2:29 am to [place] , who verbally acknowledged these results.   Electronically Signed   By: Jeb Levering M.D.   On: 05/11/2014 02:29   Dg Chest Port 1 View  05/11/2014   CLINICAL DATA:  Shortness of breath.  EXAM: PORTABLE CHEST - 1 VIEW  COMPARISON:  06/22/2013  FINDINGS: Cardiomediastinal contours are unchanged allowing for differences in technique. There is minimal right greater than left basilar atelectasis or scarring. Pulmonary vasculature is normal. No consolidation, pleural effusion, or pneumothorax. No acute osseous abnormalities are seen.  IMPRESSION: Mild right greater  than left bibasilar atelectasis or scarring.   Electronically Signed   By: Jeb Levering M.D.   On: 05/11/2014 00:48    ASSESSMENT / PLAN: Principal Problem:   Pulmonary embolus Active Problems:   Hypothyroidism   ADDISON'S ANEMIA   Chronic systolic CHF (congestive heart failure)   CKD (chronic kidney disease) stage 3, GFR 30-59 ml/min   Peptic ulcer disease   #1 acute pulmonary emboli with segmental and subsegmental blood clots.  Discussion:  Venous Doppler ultrasound of lower extremity is pending. There is mild  right heart strain on CT angiogram but it is not significant enough to warrant catheter directed thrombolytic lysis.  Previous history of left lower extremity deep venous thrombosis 1 year ago suggests a tendency for clotting. Specific clotting evaluation will need to be delayed until patient is out of the acute setting.  With history of chronic kidney disease and low creatinine clearance and lack of reversal agent and prior history of GI bleeding warfarin would be the best anticoagulant for choice in the short-term.  Note all of the Noac agents will have reversal therapy available by summer 2016   Recommendations:   Continue IV heparin with transitioning to Lovenox injections   Initiate warfarin therapy   Follow daily CBC   Add Pepcid therapy twice a day   Consider consulting  gastroenterology for risk factor assessment relative to anticoagulation   Follow-up results of venous Doppler ultrasound   Mobilization of the patient is acceptable at this time   Pulmonary can follow this patient hasn't outpatient setting but also the patient could be    referred to the [facility] anticoagulation clinic  Mariel Sleet [place]  Cell  [phone]  If no response or cell goes to voicemail, call beeper [phone]  Pulmonary and [place] Pager: [phone]  05/12/2014, 10:15 AM

## 2014-05-12 NOTE — Progress Notes (Signed)
VASCULAR LAB PRELIMINARY  PRELIMINARY  PRELIMINARY  PRELIMINARY  Bilateral lower extremity venous Dopplers completed.    Preliminary report:  There is acute, non occlusive DVT noted throughout the right lower extremity, beginning in the posterior tibial and peroneal veins and coursing through to the popliteal, femoral, and common femoral veins.  There does not appear to be any propagation of thrombus to the left lower extremity.   Cincere Zorn, RVT 05/12/2014, 10:36 AM

## 2014-05-12 NOTE — Consult Note (Signed)
[facility] CONSULT Reason for consult: abdominal and chest pain in gentleman who is currently anticoagulated and has prior history of gastric ulcers Referring Physician: [facility] hospital[facility]. PCP: Dr. Nnodi. Primary G.I.: Dr. Hayes  [name] is an 74 y.o. male.  HPI: he is currently hospitalized in the ICU with acute segmental bilateral pulmonary emboli in DVT in the left lower extremity and anticoagulated. He's had history of previous DVT's and G.I. bleeds. He saw Dr. Hayes and had small gastric ulcers with negative biopsies for H. pylori about a year ago. It was felt that this is probably due to NSAIDs. Dr. Hayes has been following him in the office for chronic severe dyspepsia felt to be due to reflux. He had a repeat EGD at [facility]15 showing erosive gastritis but no ulcerations biopsies negative again for H. pylori. Screening colonoscopy 3/15 was negative for colon polyps, diverticula and positive only for severe melanocytes:.   the patient states in the office records confirm that he has been on multiple PPI's as well as Carafate and metoclopramide for severe dyspepsia. It appears that all of these medications work to some extent but not appear to adequately control of symptoms. He has multiple other medical problems including Addison's disease, history of several DVT's requiring anticoagulation with G.I. bleeding palm Xarelto. He's had chronic back pain and other pains for which he is used NSAIDs in the past. He apparently was a very heavy user of BC powders that he states in his wife confirms that he is not used any BC powders since his 1st DVT and G.I. bleed approximately one year ago. I carefully question them about BC powders, ibuprofen etc. and they insist that he is taking none of these. He's had no melena or hematochezia. He continues to have fairly severe dyspepsia usually worse after eating and most recently has not been on any acid reducing medications since he  states that none of these tender work very long. He has history COPD, chronic congestive heart failure and does require home oxygen. It appears that he has secondary pulmonary hypertension as well. He has CKD. He is currently receiving IV heparin and consideration is being given to changing to Coumadin since he has had significant bleeding in the past with Xarelto. Were asked to see him regarding whether or not he may have any significant lesions in his stomach.  Past Medical History  Diagnosis Date  . Chronic diastolic CHF (congestive heart failure)     a. His initial ejection fraction was between 30 and 35%;  b. 04/2013 Echo: EF 50-55% Gr1 DD, mild-mod MR.  . Thyroid disease   . Pulmonary HTN   . Hypertension   . Addison's disease   . Hypogonadism male   . CKD (chronic kidney disease), stage III   . History of aortic insufficiency   . Hx of cardiovascular stress test     a. ETT-Myoview 7/14:  Low risk, inferior defect consistent with thinning, no ischemia, normal wall motion, EF 54%  . DVT (deep venous thrombosis) ~ 02/2013    BLE  . GERD (gastroesophageal reflux disease)   . H/O hiatal hernia   . Hepatitis 1959    "think it was a form of C"   . Daily headache   . Chronic back pain     "neck to tailbone" (04/11/2013)  . Peptic ulcer disease     suspected 04/11/2013  . On home oxygen therapy     "2-3L @ night" (04/11/2013)  .   Acute lower GI bleeding     admitted to [facility] 04/11/2013    Past Surgical History  Procedure Laterality Date  . Cardiac catheterization  06/26/2008    EF 30-35%  . US echocardiography  04/09/2010    EF 60-65%  . Tonsillectomy  1940's  . Appendectomy  1950's  . Cholecystectomy  ~ 1965  . Esophagogastroduodenoscopy N/A 04/11/2013    Procedure: ESOPHAGOGASTRODUODENOSCOPY (EGD);  Surgeon: Missy Sabins, MD;  Location: [facility] END[facility]COPY;  Service: Endoscopy;  Laterality: N/A;    Family History  Problem Relation Age of Onset  . Clotting disorder Mother   . Clotting  disorder Brother   . Clotting disorder Brother   . Clotting disorder Brother   . Clotting disorder Other     Niece.    Social History:  reports that he has never smoked. He has never used smokeless tobacco. He reports that he does not drink alcohol or use illicit drugs.  Allergies: No Known Allergies  Medications; Prior to Admission medications   Medication Sig Start Date End Date Taking? Authorizing Provider  amLODipine (NORVASC) 5 MG tablet Take 5 mg by mouth daily.  12/05/12  Yes Historical Provider, MD  carvedilol (COREG) 25 MG tablet TAKE 1 TABLET TWICE A DAY WITH MEALS 07/25/13  Yes Thayer Headings, MD  Cholecalciferol (VITAMIN D3 PO) Take 1,000 capsules by mouth daily.    Yes Historical Provider, MD  furosemide (LASIX) 40 MG tablet Take 1 tablet (40 mg total) by mouth 2 (two) times daily. 05/07/14  Yes Rogelia Mire, NP  gabapentin (NEURONTIN) 600 MG tablet Take 600 mg by mouth daily.   Yes Historical Provider, MD  hydrocortisone (CORTEF) 20 MG tablet Take 20 mg by mouth 3 (three) times daily.    Yes Historical Provider, MD  lisinopril (PRINIVIL,ZESTRIL) 10 MG tablet TAKE 1 TABLET DAILY (NEEDS TO CONTACT OFFICE TO SCHEDULE APPOINTMENT FOR FUTURE REFILLS)   Yes Thayer Headings, MD  metoCLOPramide (REGLAN) 5 MG tablet Take 5 mg by mouth daily.    Yes Historical Provider, MD  potassium chloride (K-DUR,KLOR-CON) 10 MEQ tablet Take 1 tablet (10 mEq total) by mouth 2 (two) times daily. 09/14/13  Yes Thayer Headings, MD  PROAIR HFA 108 (90 BASE) MCG/ACT inhaler INHALE 2 PUFFS INTO THE LUNGS EVERY 6 (SIX) HOURS AS NEEDED FOR WHEEZING OR SHORTNESS OF BREATH. 01/16/14  Yes Collene Gobble, MD  thyroid (ARMOUR) 60 MG tablet Take 60 mg by mouth daily.    Yes Historical Provider, MD  traMADol (ULTRAM) 50 MG tablet Take 50 mg by mouth 3 (three) times daily.    Yes Historical Provider, MD  traZODone (DESYREL) 100 MG tablet Take 50-100 mg by mouth every evening.  04/16/14  Yes Historical Provider,  MD   . amLODipine  5 mg Oral Daily  . carvedilol  25 mg Oral BID WC  . famotidine  20 mg Oral BID  . hydrocortisone  20 mg Oral TID WC  . insulin aspart  0-9 Units Subcutaneous 6 times per day  . metoCLOPramide  5 mg Oral Q breakfast  . pantoprazole  40 mg Oral Daily  . sodium chloride  3 mL Intravenous Q12H  . thyroid  60 mg Oral Daily  . traMADol  50 mg Oral TID  . traZODone  50-100 mg Oral QPM   PRN Meds acetaminophen, albuterol Results for orders placed or performed during the hospital encounter of 05/10/14 (from the past 48 hour(s))  Basic metabolic panel  Status: Abnormal   Collection Time: 05/10/14 11:48 PM  Result Value Ref Range   Sodium 141 135 - 145 mmol/L   Potassium 3.5 3.5 - 5.1 mmol/L   Chloride 103 96 - 112 mmol/L   CO2 24 19 - 32 mmol/L   Glucose, Bld 176 (H) 70 - 99 mg/dL   BUN 26 (H) 6 - 23 mg/dL   Creatinine, Ser 1.88 (H) 0.50 - 1.35 mg/dL   Calcium 9.0 8.4 - 10.5 mg/dL   GFR calc non Af Amer 34 (L) >90 mL/min   GFR calc Af Amer 39 (L) >90 mL/min    Comment: (NOTE) The eGFR has been calculated using the CKD EPI equation. This calculation has not been validated in all clinical situations. eGFR's persistently <90 mL/min signify possible Chronic Kidney Disease.    Anion gap 14 5 - 15  BNP (order ONLY if patient complains of dyspnea/SOB AND you have documented it for THIS visit)     Status: None   Collection Time: 05/10/14 11:48 PM  Result Value Ref Range   B Natriuretic Peptide 26.9 0.0 - 100.0 pg/mL  CBC with Differential     Status: Abnormal   Collection Time: 05/10/14 11:48 PM  Result Value Ref Range   WBC 13.3 (H) 4.0 - 10.5 K/uL    Comment: WHITE COUNT CONFIRMED ON SMEAR   RBC 4.11 (L) 4.22 - 5.81 MIL/uL   Hemoglobin 12.5 (L) 13.0 - 17.0 g/dL   HCT 37.8 (L) 39.0 - 52.0 %   MCV 92.0 78.0 - 100.0 fL   MCH 30.4 26.0 - 34.0 pg   MCHC 33.1 30.0 - 36.0 g/dL   RDW 15.9 (H) 11.5 - 15.5 %   Platelets 225 150 - 400 K/uL   Neutrophils Relative % 78  (H) 43 - 77 %   Lymphocytes Relative 14 12 - 46 %   Monocytes Relative 7 3 - 12 %   Eosinophils Relative 1 0 - 5 %   Basophils Relative 0 0 - 1 %   Neutro Abs 10.4 (H) 1.7 - 7.7 K/uL   Lymphs Abs 1.9 0.7 - 4.0 K/uL   Monocytes Absolute 0.9 0.1 - 1.0 K/uL   Eosinophils Absolute 0.1 0.0 - 0.7 K/uL   Basophils Absolute 0.0 0.0 - 0.1 K/uL   WBC Morphology ATYPICAL LYMPHOCYTES   Troponin I     Status: None   Collection Time: 05/10/14 11:48 PM  Result Value Ref Range   Troponin I <0.03 <0.031 ng/mL    Comment:        NO INDICATION OF MYOCARDIAL INJURY.   D-dimer, quantitative     Status: Abnormal   Collection Time: 05/10/14 11:48 PM  Result Value Ref Range   D-Dimer, Quant 4.80 (H) 0.00 - 0.48 ug/mL-FEU    Comment:        AT THE INHOUSE ESTABLISHED CUTOFF VALUE OF 0.48 ug/mL FEU, THIS ASSAY HAS BEEN DOCUMENTED IN THE LITERATURE TO HAVE A SENSITIVITY AND NEGATIVE PREDICTIVE VALUE OF AT LEAST 98 TO 99%.  THE TEST RESULT SHOULD BE CORRELATED WITH AN ASSESSMENT OF THE CLINICAL PROBABILITY OF DVT / VTE.   CBC WITH DIFFERENTIAL     Status: Abnormal   Collection Time: 05/11/14  3:54 AM  Result Value Ref Range   WBC 12.3 (H) 4.0 - 10.5 K/uL   RBC 3.87 (L) 4.22 - 5.81 MIL/uL   Hemoglobin 11.6 (L) 13.0 - 17.0 g/dL   HCT 35.0 (L) 39.0 - 52.0 %     MCV 90.4 78.0 - 100.0 fL   MCH 30.0 26.0 - 34.0 pg   MCHC 33.1 30.0 - 36.0 g/dL   RDW 15.8 (H) 11.5 - 15.5 %   Platelets 175 150 - 400 K/uL   Neutrophils Relative % 67 43 - 77 %   Neutro Abs 8.2 (H) 1.7 - 7.7 K/uL   Lymphocytes Relative 22 12 - 46 %   Lymphs Abs 2.8 0.7 - 4.0 K/uL   Monocytes Relative 9 3 - 12 %   Monocytes Absolute 1.1 (H) 0.1 - 1.0 K/uL   Eosinophils Relative 2 0 - 5 %   Eosinophils Absolute 0.2 0.0 - 0.7 K/uL   Basophils Relative 0 0 - 1 %   Basophils Absolute 0.0 0.0 - 0.1 K/uL  Comprehensive metabolic panel     Status: Abnormal   Collection Time: 05/11/14  3:54 AM  Result Value Ref Range   Sodium 141 135 - 145  mmol/L   Potassium 2.9 (L) 3.5 - 5.1 mmol/L    Comment: DELTA CHECK NOTED   Chloride 104 96 - 112 mmol/L   CO2 22 19 - 32 mmol/L   Glucose, Bld 103 (H) 70 - 99 mg/dL   BUN 24 (H) 6 - 23 mg/dL   Creatinine, Ser 1.65 (H) 0.50 - 1.35 mg/dL   Calcium 8.4 8.4 - 10.5 mg/dL   Total Protein 6.0 6.0 - 8.3 g/dL   Albumin 3.2 (L) 3.5 - 5.2 g/dL   AST 12 0 - 37 U/L   ALT 16 0 - 53 U/L   Alkaline Phosphatase 50 39 - 117 U/L   Total Bilirubin 0.7 0.3 - 1.2 mg/dL   GFR calc non Af Amer 40 (L) >90 mL/min   GFR calc Af Amer 46 (L) >90 mL/min    Comment: (NOTE) The eGFR has been calculated using the CKD EPI equation. This calculation has not been validated in all clinical situations. eGFR's persistently <90 mL/min signify possible Chronic Kidney Disease.    Anion gap 15 5 - 15  Troponin I     Status: None   Collection Time: 05/11/14  3:54 AM  Result Value Ref Range   Troponin I <0.03 <0.031 ng/mL    Comment:        NO INDICATION OF MYOCARDIAL INJURY.   Magnesium     Status: None   Collection Time: 05/11/14  3:54 AM  Result Value Ref Range   Magnesium 2.1 1.5 - 2.5 mg/dL  CBG monitoring, ED     Status: None   Collection Time: 05/11/14  4:32 AM  Result Value Ref Range   Glucose-Capillary 95 70 - 99 mg/dL  MRSA PCR Screening     Status: None   Collection Time: 05/11/14  5:37 AM  Result Value Ref Range   MRSA by PCR NEGATIVE NEGATIVE    Comment:        The GeneXpert MRSA Assay (FDA approved for NASAL specimens only), is one component of a comprehensive MRSA colonization surveillance program. It is not intended to diagnose MRSA infection nor to guide or monitor treatment for MRSA infections.   Glucose, capillary     Status: None   Collection Time: 05/11/14  7:52 AM  Result Value Ref Range   Glucose-Capillary 88 70 - 99 mg/dL  Troponin I     Status: None   Collection Time: 05/11/14  9:24 AM  Result Value Ref Range   Troponin I <0.03 <0.031 ng/mL    Comment:  NO  INDICATION OF MYOCARDIAL INJURY.   Heparin level (unfractionated)     Status: None   Collection Time: 05/11/14 11:20 AM  Result Value Ref Range   Heparin Unfractionated 0.59 0.30 - 0.70 IU/mL    Comment:        IF HEPARIN RESULTS ARE BELOW EXPECTED VALUES, AND PATIENT DOSAGE HAS BEEN CONFIRMED, SUGGEST FOLLOW UP TESTING OF ANTITHROMBIN III LEVELS.   Glucose, capillary     Status: Abnormal   Collection Time: 05/11/14 11:39 AM  Result Value Ref Range   Glucose-Capillary 101 (H) 70 - 99 mg/dL  Troponin I     Status: None   Collection Time: 05/11/14  3:30 PM  Result Value Ref Range   Troponin I 0.03 <0.031 ng/mL    Comment:        NO INDICATION OF MYOCARDIAL INJURY.   TSH     Status: Abnormal   Collection Time: 05/11/14  3:30 PM  Result Value Ref Range   TSH 0.322 (L) 0.350 - 4.500 uIU/mL  Glucose, capillary     Status: Abnormal   Collection Time: 05/11/14  4:36 PM  Result Value Ref Range   Glucose-Capillary 168 (H) 70 - 99 mg/dL   Comment 1 Notify RN    Comment 2 Document in Chart   Heparin level (unfractionated)     Status: None   Collection Time: 05/11/14  7:50 PM  Result Value Ref Range   Heparin Unfractionated 0.52 0.30 - 0.70 IU/mL    Comment:        IF HEPARIN RESULTS ARE BELOW EXPECTED VALUES, AND PATIENT DOSAGE HAS BEEN CONFIRMED, SUGGEST FOLLOW UP TESTING OF ANTITHROMBIN III LEVELS.   Glucose, capillary     Status: None   Collection Time: 05/11/14  7:52 PM  Result Value Ref Range   Glucose-Capillary 97 70 - 99 mg/dL  Glucose, capillary     Status: Abnormal   Collection Time: 05/11/14 11:50 PM  Result Value Ref Range   Glucose-Capillary 137 (H) 70 - 99 mg/dL   Comment 1 Notify RN   CBC WITH DIFFERENTIAL     Status: Abnormal   Collection Time: 05/12/14  3:00 AM  Result Value Ref Range   WBC 12.1 (H) 4.0 - 10.5 K/uL   RBC 3.69 (L) 4.22 - 5.81 MIL/uL   Hemoglobin 11.0 (L) 13.0 - 17.0 g/dL   HCT 33.6 (L) 39.0 - 52.0 %   MCV 91.1 78.0 - 100.0 fL   MCH  29.8 26.0 - 34.0 pg   MCHC 32.7 30.0 - 36.0 g/dL   RDW 15.6 (H) 11.5 - 15.5 %   Platelets 188 150 - 400 K/uL   Neutrophils Relative % 68 43 - 77 %   Neutro Abs 8.2 (H) 1.7 - 7.7 K/uL   Lymphocytes Relative 21 12 - 46 %   Lymphs Abs 2.5 0.7 - 4.0 K/uL   Monocytes Relative 10 3 - 12 %   Monocytes Absolute 1.2 (H) 0.1 - 1.0 K/uL   Eosinophils Relative 1 0 - 5 %   Eosinophils Absolute 0.2 0.0 - 0.7 K/uL   Basophils Relative 0 0 - 1 %   Basophils Absolute 0.0 0.0 - 0.1 K/uL  Comprehensive metabolic panel     Status: Abnormal   Collection Time: 05/12/14  3:00 AM  Result Value Ref Range   Sodium 139 135 - 145 mmol/L   Potassium 3.4 (L) 3.5 - 5.1 mmol/L   Chloride 107 96 - 112 mmol/L   CO2  23 19 - 32 mmol/L   Glucose, Bld 97 70 - 99 mg/dL   BUN 15 6 - 23 mg/dL   Creatinine, Ser 1.37 (H) 0.50 - 1.35 mg/dL   Calcium 8.0 (L) 8.4 - 10.5 mg/dL   Total Protein 5.6 (L) 6.0 - 8.3 g/dL   Albumin 2.7 (L) 3.5 - 5.2 g/dL   AST 11 0 - 37 U/L   ALT 13 0 - 53 U/L   Alkaline Phosphatase 44 39 - 117 U/L   Total Bilirubin 0.8 0.3 - 1.2 mg/dL   GFR calc non Af Amer 50 (L) >90 mL/min   GFR calc Af Amer 57 (L) >90 mL/min    Comment: (NOTE) The eGFR has been calculated using the CKD EPI equation. This calculation has not been validated in all clinical situations. eGFR's persistently <90 mL/min signify possible Chronic Kidney Disease.    Anion gap 9 5 - 15  Heparin level (unfractionated)     Status: None   Collection Time: 05/12/14  3:00 AM  Result Value Ref Range   Heparin Unfractionated 0.47 0.30 - 0.70 IU/mL    Comment:        IF HEPARIN RESULTS ARE BELOW EXPECTED VALUES, AND PATIENT DOSAGE HAS BEEN CONFIRMED, SUGGEST FOLLOW UP TESTING OF ANTITHROMBIN III LEVELS.   Glucose, capillary     Status: None   Collection Time: 05/12/14  4:03 AM  Result Value Ref Range   Glucose-Capillary 92 70 - 99 mg/dL   Comment 1 Notify RN   Glucose, capillary     Status: None   Collection Time: 05/12/14   8:19 AM  Result Value Ref Range   Glucose-Capillary 79 70 - 99 mg/dL   Comment 1 Notify RN    Comment 2 Document in Chart   Glucose, capillary     Status: None   Collection Time: 05/12/14 11:13 AM  Result Value Ref Range   Glucose-Capillary 90 70 - 99 mg/dL   Comment 1 Notify RN    Comment 2 Document in Chart     Ct Angio Chest Pe W/cm &/or Wo Cm  05/11/2014   CLINICAL DATA:  Shortness of breath, elevated D-dimer. Dyspnea on exertion. History of DVT.  EXAM: CT ANGIOGRAPHY CHEST WITH CONTRAST  TECHNIQUE: Multidetector CT imaging of the chest was performed using the standard protocol during bolus administration of intravenous contrast. Multiplanar CT image reconstructions and MIPs were obtained to evaluate the vascular anatomy.  CONTRAST:  75mL OMNIPAQUE IOHEXOL 350 MG/ML SOLN  COMPARISON:  Chest radiograph earlier this date.  FINDINGS: There is filling defects within the right and left pulmonary arterial system consistent pulmonary embolus. On the left this involves the left lower lobe pulmonary artery extending into the subsegmental branches primarily posterior basal. Thrombus extends into the left upper lobe and lingular segmental arteries. On the right there is thrombus in the right upper lobe pulmonary artery extending into the segmental and subsegmental branches, right middle lobe segmental and subsegmental branches, and subsegmental branches of the basilar lower lobes. There is right heart strain with a RV to LV ratio of 1.4.  The thoracic aorta is normal in caliber with mild atherosclerosis. Coronary artery calcifications are seen. There is no pericardial or pleural effusion. No definite pulmonary infarct. There peripheral linear opacities in the lower lobes, lingula, and right middle lobes, likely atelectasis.  Evaluation of the upper abdomen demonstrates splenic granuloma. Postcholecystectomy clips noted.  There are no acute or suspicious osseous abnormalities.  Review of the MIP images    confirms the above findings.  IMPRESSION: Positive for bilateral acute PE with CT evidence of right heart strain (RV/LV Ratio = 1.0) consistent with at least submassive (intermediate risk) PE. The presence of right heart strain has been associated with an increased risk of morbidity and mortality. Please activate Code PE by paging [phone].  Critical Value/emergent results were called by telephone at the time of interpretation on 05/11/2014 at 2:29 am to [place] , who verbally acknowledged these results.   Electronically Signed   By: Jeb Levering M.D.   On: 05/11/2014 02:29   Dg Chest Port 1 View  05/11/2014   CLINICAL DATA:  Shortness of breath.  EXAM: PORTABLE CHEST - 1 VIEW  COMPARISON:  06/22/2013  FINDINGS: Cardiomediastinal contours are unchanged allowing for differences in technique. There is minimal right greater than left basilar atelectasis or scarring. Pulmonary vasculature is normal. No consolidation, pleural effusion, or pneumothorax. No acute osseous abnormalities are seen.  IMPRESSION: Mild right greater than left bibasilar atelectasis or scarring.   Electronically Signed   By: Jeb Levering M.D.   On: 05/11/2014 00:48   ROS: Constitutional:chronic weakness and fatigue in shortness of breath with exertion HEENT: negative  Cardiovascular: chronic chest pain probably reflux for dyspepsia with coronary history noted above Respiratory: COPD requiring oxygen height with chronic shortness of breath  GI:as above  GU: negative  Musculoskeletal:chronic back pain and musculoskeletal pain.  Neuro/Psychiatric: negative  Endocrine/Heme: hypothyroidism, Addison's disease, history of coagulopathy personally as well as family history            Blood pressure 153/86, pulse 78, temperature 99.2 F (37.3 C), temperature source Oral, resp. rate 15, height 5' 10" (1.778 m), weight 95.4 kg (210 lb 5.1 oz), SpO2 98 %.  Physical exam:   General-- alert male who is somewhat  short of breath unable to talk without difficulty ENT--nonicteric mucous membranes moist Neck-- somewhat chubby neck no acute distress  Heart--regular rate and rhythm without murmurs are gallops Lungs-- clear Abdomen-- soft and nontender  Psych-- alert and oriented and appropriate    Assessment: 1. Bilateral pulmonary emboli and recurrent DVT. Patient until anticoagulation. Will need to be changed to Coumadin. 2. Chronic chest pain and dyspepsia felt to be due to reflux that has been refractory multiple different medications. This also had history of chronic gastritis and gastric ulcers. I agree EGD would be appropriate prior to initiating Coumadin therapy. 3. Pulmonary hypertension on chronic 02  Plan: 1. We will hold his heparin infusion in the morning and he will undergo EGD by Dr. Paulita Fujita at 9 AM. Have discussed this with the patient and his wife. He had full colonoscopy 1 year ago it was negative other than melanoma sis and I do not think I would repeat that at this time.  2. I would go ahead with stool Hemoccult just to be sure that there is not ongoing G.I. bleeding.  3. Since he has had routine clotting abnormalities in a strongly positive family history of clotting abnormalities, would question if he needs to be seen by hematology. I presume that this may have been done in the past but cannot find any evidence of that in epic.    Illiana Losurdo JR,Bali Lyn L 05/12/2014, 12:36 PM

## 2014-05-12 NOTE — Progress Notes (Addendum)
ANTICOAGULATION CONSULT NOTE  Pharmacy Consult for Heparin  Indication: pulmonary embolus  No Known Allergies  Patient Measurements: Height: 5\' 10"  (177.8 cm) Weight: 210 lb 5.1 oz (95.4 kg) IBW/kg (Calculated) : 73  Vital Signs: Temp: 98 F (36.7 C) (04/17 0800) Temp Source: Oral (04/17 0800) BP: 153/86 mmHg (04/17 0800) Pulse Rate: 78 (04/17 0404)  Labs:  Recent Labs  05/10/14 2348 05/11/14 0354 05/11/14 0924 05/11/14 1120 05/11/14 1530 05/11/14 1950 05/12/14 0300  HGB 12.5* 11.6*  --   --   --   --  11.0*  HCT 37.8* 35.0*  --   --   --   --  33.6*  PLT 225 175  --   --   --   --  188  HEPARINUNFRC  --   --   --  0.59  --  0.52 0.47  CREATININE 1.88* 1.65*  --   --   --   --  1.37*  TROPONINI <0.03 <0.03 <0.03  --  0.03  --   --     Estimated Creatinine Clearance: 55.7 mL/min (by C-G formula based on Cr of 1.37).  Assessment: 74 y/o M who has been short of breath for weeks, elevated D-dimer, CT Angio with bilateral PE with righ heart strain, pt with recent history of bilateral DVT's, but anti-coagulation was STOPPED due to GI BLEED (Xarelto). Pharmacy consulted to dose heparin for new PE.  Heparin level remains therapeutic at 0.47 on 1200 units/hr. Hgb 11, plts 188. No signs of bleeding noted. Baseline INR is 1.15.  Goal of Therapy:  Heparin level 0.3-0.7 units/ml Monitor platelets by anticoagulation protocol: Yes   Plan:  -Give Warfarin 7.5 mg X 1 -Continue heparin at 1200 units/hr -Daily CBC/HL, PT/INR -Monitor for bleeding, trend Hgb  Theron Arista, PharmD Clinical Pharmacist - Resident Pager: [phone] 4/17/20169:25 AM

## 2014-05-13 ENCOUNTER — Inpatient Hospital Stay (HOSPITAL_COMMUNITY): Payer: Medicare Other | Admitting: Certified Registered Nurse Anesthetist

## 2014-05-13 ENCOUNTER — Encounter (HOSPITAL_COMMUNITY): Payer: Self-pay | Admitting: Certified Registered Nurse Anesthetist

## 2014-05-13 ENCOUNTER — Encounter (HOSPITAL_COMMUNITY)
Admission: EM | Disposition: A | Discharge status: Home / Self Care | Payer: Self-pay | Source: Home / Self Care | Attending: Internal Medicine

## 2014-05-13 DIAGNOSIS — K279 Peptic ulcer, site unspecified, unspecified as acute or chronic, without hemorrhage or perforation: Secondary | ICD-10-CM

## 2014-05-13 HISTORY — PX: ESOPHAGOGASTRODUODENOSCOPY: SHX5428

## 2014-05-13 LAB — COMPREHENSIVE METABOLIC PANEL
ALBUMIN: 2.8 g/dL — AB (ref 3.5–5.2)
ALT: 14 U/L (ref 0–53)
AST: 11 U/L (ref 0–37)
Alkaline Phosphatase: 47 U/L (ref 39–117)
Anion gap: 11 (ref 5–15)
BUN: 12 mg/dL (ref 6–23)
CO2: 19 mmol/L (ref 19–32)
CREATININE: 1.11 mg/dL (ref 0.50–1.35)
Calcium: 8.5 mg/dL (ref 8.4–10.5)
Chloride: 113 mmol/L — ABNORMAL HIGH (ref 96–112)
GFR calc Af Amer: 74 mL/min — ABNORMAL LOW (ref >90)
GFR calc non Af Amer: 64 mL/min — ABNORMAL LOW (ref >90)
Glucose, Bld: 102 mg/dL — ABNORMAL HIGH (ref 70–99)
Potassium: 3.7 mmol/L (ref 3.5–5.1)
Sodium: 143 mmol/L (ref 135–145)
Total Bilirubin: 0.7 mg/dL (ref 0.3–1.2)
Total Protein: 5.9 g/dL — ABNORMAL LOW (ref 6.0–8.3)

## 2014-05-13 LAB — CBC WITH DIFFERENTIAL/PLATELET
Basophils Absolute: 0 10*3/uL (ref 0.0–0.1)
Basophils Relative: 0 % (ref 0–1)
EOS PCT: 1 % (ref 0–5)
Eosinophils Absolute: 0.1 10*3/uL (ref 0.0–0.7)
HCT: 34 % — ABNORMAL LOW (ref 39.0–52.0)
Hemoglobin: 10.9 g/dL — ABNORMAL LOW (ref 13.0–17.0)
Lymphocytes Relative: 20 % (ref 12–46)
Lymphs Abs: 2 10*3/uL (ref 0.7–4.0)
MCH: 29.4 pg (ref 26.0–34.0)
MCHC: 32.1 g/dL (ref 30.0–36.0)
MCV: 91.6 fL (ref 78.0–100.0)
MONO ABS: 1 10*3/uL (ref 0.1–1.0)
Monocytes Relative: 10 % (ref 3–12)
Neutro Abs: 6.7 10*3/uL (ref 1.7–7.7)
Neutrophils Relative %: 69 % (ref 43–77)
PLATELETS: 188 10*3/uL (ref 150–400)
RBC: 3.71 MIL/uL — AB (ref 4.22–5.81)
RDW: 15.4 % (ref 11.5–15.5)
WBC: 9.8 10*3/uL (ref 4.0–10.5)

## 2014-05-13 LAB — HEPARIN LEVEL (UNFRACTIONATED): Heparin Unfractionated: 0.29 IU/mL — ABNORMAL LOW (ref 0.30–0.70)

## 2014-05-13 LAB — GLUCOSE, CAPILLARY
GLUCOSE-CAPILLARY: 90 mg/dL (ref 70–99)
GLUCOSE-CAPILLARY: 82 mg/dL (ref 70–99)
Glucose-Capillary: 112 mg/dL — ABNORMAL HIGH (ref 70–99)

## 2014-05-13 LAB — HEMOGLOBIN A1C
HEMOGLOBIN A1C: 6.2 % — AB (ref 4.8–5.6)
Mean Plasma Glucose: 131 mg/dL

## 2014-05-13 LAB — PROTIME-INR
INR: 1.23 (ref 0.00–1.49)
PROTHROMBIN TIME: 15.7 s — AB (ref 11.6–15.2)

## 2014-05-13 SURGERY — EGD (ESOPHAGOGASTRODUODENOSCOPY)
Anesthesia: Monitor Anesthesia Care

## 2014-05-13 MED ORDER — MORPHINE SULFATE 2 MG/ML IJ SOLN
2.0000 mg | INTRAMUSCULAR | Status: DC | PRN
  Administered 2014-05-13: 2 mg via INTRAVENOUS
  Filled 2014-05-13: qty 1

## 2014-05-13 MED ORDER — LACTATED RINGERS IV SOLN
INTRAVENOUS | Status: DC
  Administered 2014-05-13: 1000 mL via INTRAVENOUS

## 2014-05-13 MED ORDER — WARFARIN SODIUM 5 MG PO TABS: 5.0000 mg | ORAL_TABLET | Freq: Every day | ORAL | Status: DC

## 2014-05-13 MED ORDER — DOCUSATE SODIUM 100 MG PO CAPS: 100.0000 mg | ORAL_CAPSULE | Freq: Two times a day (BID) | ORAL | Status: DC

## 2014-05-13 MED ORDER — WARFARIN SODIUM 6 MG PO TABS: 6.0000 mg | ORAL_TABLET | Freq: Once | ORAL | Status: DC

## 2014-05-13 MED ORDER — PROPOFOL INFUSION 10 MG/ML OPTIME
INTRAVENOUS | Status: DC | PRN
  Administered 2014-05-13: 75 ug/kg/min via INTRAVENOUS

## 2014-05-13 MED ORDER — ENOXAPARIN SODIUM 100 MG/ML ~~LOC~~ SOLN
95.0000 mg | Freq: Two times a day (BID) | SUBCUTANEOUS | Status: DC
  Administered 2014-05-13: 95 mg via SUBCUTANEOUS
  Filled 2014-05-13 (×2): qty 1

## 2014-05-13 MED ORDER — BUTAMBEN-TETRACAINE-BENZOCAINE 2-2-14 % EX AERO
INHALATION_SPRAY | CUTANEOUS | Status: DC | PRN
  Administered 2014-05-13: 1 via TOPICAL

## 2014-05-13 MED ORDER — PANTOPRAZOLE SODIUM 40 MG PO TBEC: 40.0000 mg | DELAYED_RELEASE_TABLET | Freq: Every day | ORAL | Status: DC

## 2014-05-13 MED ORDER — WARFARIN SODIUM 6 MG PO TABS
6.0000 mg | ORAL_TABLET | Freq: Once | ORAL | Status: AC
  Administered 2014-05-13: 6 mg via ORAL
  Filled 2014-05-13: qty 1

## 2014-05-13 MED ORDER — LACTATED RINGERS IV SOLN
INTRAVENOUS | Status: DC | PRN
  Administered 2014-05-13: 09:00:00 via INTRAVENOUS

## 2014-05-13 MED ORDER — ONDANSETRON HCL 4 MG/2ML IJ SOLN
INTRAMUSCULAR | Status: DC | PRN
  Administered 2014-05-13: 4 mg via INTRAVENOUS

## 2014-05-13 MED ORDER — SODIUM CHLORIDE 0.9 % IV SOLN: INTRAVENOUS | Status: DC

## 2014-05-13 MED ORDER — ENOXAPARIN SODIUM 100 MG/ML ~~LOC~~ SOLN: 100.0000 mg | Freq: Two times a day (BID) | SUBCUTANEOUS | Status: DC

## 2014-05-13 NOTE — Discharge Summary (Addendum)
Physician Discharge Summary  [name] YTK:[id number] DOB: 1940/04/27 DOA: 05/10/2014  PCP:  Melinda Crutch, MD  Admit date: 05/10/2014 Discharge date: 05/13/2014  Time spent: 20 minutes  Recommendations for Outpatient Follow-up:  1. Follow up with PCP in 1-2 weeks 2. Follow up with Hematology as scheduled 3. Follow up with Pulmonary for INR check  Discharge Diagnoses:  Principal Problem:   Pulmonary embolus Active Problems:   Hypothyroidism   ADDISON'S ANEMIA   Chronic systolic CHF (congestive heart failure)   CKD (chronic kidney disease) stage 3, GFR 30-59 ml/min   Peptic ulcer disease   Discharge Condition: Stable  Diet recommendation: Heart healthy  Filed Weights   05/10/14 2340 05/11/14 0526  Weight: 92.534 kg (204 lb) 95.4 kg (210 lb 5.1 oz)    History of present illness:  Please see admit h and p from 4/16 for details. Briefly, pt presented with new B PE with heart strain on CT. Pt was admitted for further work up.  Hospital Course:  1. Submassive B PE with acute R LE DVT, L DVT 1. Pt initially continued on heparin GTT,transitioned to lovenox on discharge 2. Critical Care and IR both consulted through ED on admission 3. Discussed case with Pulmonary, plans to transition to warfarin with lovenox bridge 4. LE dopplers ordered with new RLE DVT 5. Reports significant family hx of thrombotic disease. When more stable, would have pt follow up closely with Hematology as an outpatient for further eval 6. On ambulation, pt with O2 sat of 88% with increased respiratory effort 7. Will set pt up for home O2 2. Diastolic CHF 1. Clinically euvolemic 2. Monitor 3. Recent 2d echo with normal LVEF and grade 1 diastolic dysfunction. Results relayed by Cardiologist on call.  3. Addison's disease 1. Stable 2. Pt is continued on flourinef 4. Stage 3 CKD 1. Cr near baseline 2. Cont to monitor 5. Hx significant lower GI bleed 1. hgb stable 2. No obvious signs of  bleeding 3. Monitor closely 4. Discussed case with Pulmonary, recs to consult GI given hx of transfusion dependent UGI bleed one year ago while on therapeutic anticoagulation 5. GI recs noted. Pt underwent EGD 4/18 with evidence of gastric erosions, mild prox gastritis and bulbar duodenitis with recs for indefinite pantoprazole daily as long as pt is on anticoagulation 6. Hypothyroid 1. Pt is continued on thyroid replacement 2. Most recent TSH documented from 2010 3. TSH 0.322 7. Hypokalemia 1. Replaced 8. HTN 1. BP stable  2. Cont current regimen  Procedures:  EGD 4/18  Consultations:  Pulmonary  IR  Gastroenterology  Discharge Exam: Filed Vitals:   05/13/14 0847 05/13/14 1013 05/13/14 1058 05/13/14 1100  BP: 167/93 148/91 186/87 126/81  Pulse: 72  73 72  Temp: 98.7 F (37.1 C) 98.2 F (36.8 C)  99.1 F (37.3 C)  TempSrc: Oral Oral  Oral  Resp: 14 13 17 14   Height:      Weight:      SpO2: 97% 98% 97% 93%    General: Awake, in nad Cardiovascular: regular, s1, s2 Respiratory: normal resp effort, no wheezing  Discharge Instructions     Medication List    TAKE these medications        amLODipine 5 MG tablet  Commonly known as:  NORVASC  Take 5 mg by mouth daily.     carvedilol 25 MG tablet  Commonly known as:  COREG  TAKE 1 TABLET TWICE A DAY WITH MEALS     enoxaparin  100 MG/ML injection  Commonly known as:  LOVENOX  Inject 1 mL (100 mg total) into the skin every 12 (twelve) hours.     furosemide 40 MG tablet  Commonly known as:  LASIX  Take 1 tablet (40 mg total) by mouth 2 (two) times daily.     gabapentin 600 MG tablet  Commonly known as:  NEURONTIN  Take 600 mg by mouth daily.     hydrocortisone 20 MG tablet  Commonly known as:  CORTEF  Take 20 mg by mouth 3 (three) times daily.     lisinopril 10 MG tablet  Commonly known as:  PRINIVIL,ZESTRIL  TAKE 1 TABLET DAILY (NEEDS TO CONTACT OFFICE TO SCHEDULE APPOINTMENT FOR FUTURE REFILLS)      metoCLOPramide 5 MG tablet  Commonly known as:  REGLAN  Take 5 mg by mouth daily.     pantoprazole 40 MG tablet  Commonly known as:  PROTONIX  Take 1 tablet (40 mg total) by mouth daily.     potassium chloride 10 MEQ tablet  Commonly known as:  K-DUR,KLOR-CON  Take 1 tablet (10 mEq total) by mouth 2 (two) times daily.     PROAIR HFA 108 (90 BASE) MCG/ACT inhaler  Generic drug:  albuterol  INHALE 2 PUFFS INTO THE LUNGS EVERY 6 (SIX) HOURS AS NEEDED FOR WHEEZING OR SHORTNESS OF BREATH.     thyroid 60 MG tablet  Commonly known as:  ARMOUR  Take 60 mg by mouth daily.     traMADol 50 MG tablet  Commonly known as:  ULTRAM  Take 50 mg by mouth 3 (three) times daily.     traZODone 100 MG tablet  Commonly known as:  DESYREL  Take 50-100 mg by mouth every evening.     VITAMIN D3 PO  Take 1,000 capsules by mouth daily.     warfarin 6 MG tablet  Commonly known as:  COUMADIN  Take 1 tablet (6 mg total) by mouth one time only at 6 PM.       No Known Allergies Follow-up Information    Follow up with  Melinda Crutch, MD. Schedule an appointment as soon as possible for a visit in 1 week.   Specialty:  Family Medicine   Contact information:   [place] [address] [phone]       Schedule an appointment as soon as possible for a visit with [place].   Why:  Coumadin management   Contact information:   [place] [address]       Schedule an appointment as soon as possible for a visit with Annia Belt, MD.   Specialty:  Oncology   Why:  Hospital follow up   Contact information:   [place]. [place] [address] [phone]        The results of significant diagnostics from this hospitalization (including imaging, microbiology, ancillary and laboratory) are listed below for reference.    Significant Diagnostic Studies: Ct Angio Chest Pe W/cm &/or Wo Cm  05/11/2014   CLINICAL DATA:   Shortness of breath, elevated D-dimer. Dyspnea on exertion. History of DVT.  EXAM: CT ANGIOGRAPHY CHEST WITH CONTRAST  TECHNIQUE: Multidetector CT imaging of the chest was performed using the standard protocol during bolus administration of intravenous contrast. Multiplanar CT image reconstructions and MIPs were obtained to evaluate the vascular anatomy.  CONTRAST:  46mL OMNIPAQUE IOHEXOL 350 MG/ML SOLN  COMPARISON:  Chest radiograph earlier this date.  FINDINGS: There is filling defects within the right and left pulmonary arterial system consistent pulmonary embolus. On the left this involves the left lower lobe pulmonary artery extending into the subsegmental branches primarily posterior basal. Thrombus extends into the left upper lobe and lingular segmental arteries. On the right there is thrombus in the right upper lobe pulmonary artery extending into the segmental and subsegmental branches, right middle lobe segmental and subsegmental branches, and subsegmental branches of the basilar lower lobes. There is right heart strain with a RV to LV ratio of 1.4.  The thoracic aorta is normal in caliber with mild atherosclerosis. Coronary artery calcifications are seen. There is no pericardial or pleural effusion. No definite pulmonary infarct. There peripheral linear opacities in the lower lobes, lingula, and right middle lobes, likely atelectasis.  Evaluation of the upper abdomen demonstrates splenic granuloma. Postcholecystectomy clips noted.  There are no acute or suspicious osseous abnormalities.  Review of the MIP images confirms the above findings.  IMPRESSION: Positive for bilateral acute PE with CT evidence of right heart strain (RV/LV Ratio = 1.0) consistent with at least submassive (intermediate risk) PE. The presence of right heart strain has been associated with an increased risk of morbidity and mortality. Please activate Code PE by paging [phone].  Critical Value/emergent results were called by  telephone at the time of interpretation on 05/11/2014 at 2:29 am to [place] , who verbally acknowledged these results.   Electronically Signed   By: Jeb Levering M.D.   On: 05/11/2014 02:29   Dg Chest Port 1 View  05/11/2014   CLINICAL DATA:  Shortness of breath.  EXAM: PORTABLE CHEST - 1 VIEW  COMPARISON:  06/22/2013  FINDINGS: Cardiomediastinal contours are unchanged allowing for differences in technique. There is minimal right greater than left basilar atelectasis or scarring. Pulmonary vasculature is normal. No consolidation, pleural effusion, or pneumothorax. No acute osseous abnormalities are seen.  IMPRESSION: Mild right greater than left bibasilar atelectasis or scarring.   Electronically Signed   By: Jeb Levering M.D.   On: 05/11/2014 00:48    Microbiology: Recent Results (from the past 240 hour(s))  MRSA PCR Screening     Status: None   Collection Time: 05/11/14  5:37 AM  Result Value Ref Range Status   MRSA by PCR NEGATIVE NEGATIVE Final    Comment:        The GeneXpert MRSA Assay (FDA approved for NASAL specimens only), is one component of a comprehensive MRSA colonization surveillance program. It is not intended to diagnose MRSA infection nor to guide or monitor treatment for MRSA infections.      Labs: Basic Metabolic Panel:  Recent Labs Lab 05/07/14 1619 05/10/14 2348 05/11/14 0354 05/12/14 0300 05/13/14 0250  NA 141 141 141 139 143  K 4.3 3.5 2.9* 3.4* 3.7  CL 105 103 104 107 113*  CO2 26 24 22 23 19   GLUCOSE 119* 176* 103* 97 102*  BUN 26* 26* 24* 15 12  CREATININE 1.64* 1.88* 1.65* 1.37* 1.11  CALCIUM 8.9 9.0 8.4 8.0* 8.5  MG  --   --  2.1  --   --    Liver Function Tests:  Recent Labs Lab 05/11/14 0354 05/12/14 0300 05/13/14 0250  AST 12 11 11   ALT 16 13 14   ALKPHOS 50 44 47  BILITOT 0.7 0.8 0.7  PROT 6.0 5.6* 5.9*  ALBUMIN 3.2* 2.7* 2.8*   No results for input(s): LIPASE, AMYLASE in the last 168 hours. No results  for  input(s): AMMONIA in the last 168 hours. CBC:  Recent Labs Lab 05/10/14 2348 05/11/14 0354 05/12/14 0300 05/13/14 0250  WBC 13.3* 12.3* 12.1* 9.8  NEUTROABS 10.4* 8.2* 8.2* 6.7  HGB 12.5* 11.6* 11.0* 10.9*  HCT 37.8* 35.0* 33.6* 34.0*  MCV 92.0 90.4 91.1 91.6  PLT 225 175 188 188   Cardiac Enzymes:  Recent Labs Lab 05/10/14 2348 05/11/14 0354 05/11/14 0924 05/11/14 1530  TROPONINI <0.03 <0.03 <0.03 0.03   BNP: BNP (last 3 results)  Recent Labs  05/10/14 2348  BNP 26.9    ProBNP (last 3 results)  Recent Labs  05/10/14 1405  PROBNP 24.0    CBG:  Recent Labs Lab 05/12/14 1957 05/12/14 2334 05/13/14 0348 05/13/14 0801 05/13/14 1256  GLUCAP 140* 171* 90 82 112*    Signed:  Gifford Ballon K  [facility] 05/13/2014, 2:57 PM

## 2014-05-13 NOTE — Progress Notes (Signed)
SATURATION QUALIFICATIONS: (This note is used to comply with regulatory documentation for home oxygen)  Patient Saturations on Room Air at Rest = 95%  Patient Saturations on Room Air while Ambulating = 88%  Patient Saturations on 1 Liters of oxygen while Ambulating = 94%  Please briefly explain why patient needs home oxygen:

## 2014-05-13 NOTE — Anesthesia Preprocedure Evaluation (Addendum)
Anesthesia Evaluation  Patient identified by MRN, date of birth, ID band Patient awake    Reviewed: Allergy & Precautions, H&P , NPO status , Patient's Chart, lab work & pertinent test results, reviewed documented beta blocker date and time   Airway Mallampati: II  TM Distance: >3 FB Neck ROM: Full    Dental  (+) Dental Advisory Given, Teeth Intact   Pulmonary shortness of breath and Long-Term Oxygen Therapy,  breath sounds clear to auscultation        Cardiovascular hypertension, Pt. on medications and Pt. on home beta blockers +CHF and DVT Rhythm:Regular Rate:Normal     Neuro/Psych  Headaches,    GI/Hepatic hiatal hernia, PUD, GERD-  Medicated,(+) Hepatitis -, C  Endo/Other  Hypothyroidism Morbid obesity  Renal/GU Renal InsufficiencyRenal disease     Musculoskeletal   Abdominal   Peds  Hematology  (+) anemia ,   Anesthesia Other Findings   Reproductive/Obstetrics                          Anesthesia Physical Anesthesia Plan  ASA: III  Anesthesia Plan: MAC   Post-op Pain Management:    Induction: Intravenous  Airway Management Planned: Natural Airway and Nasal Cannula  Additional Equipment:   Intra-op Plan:   Post-operative Plan:   Informed Consent: I have reviewed the patients History and Physical, chart, labs and discussed the procedure including the risks, benefits and alternatives for the proposed anesthesia with the patient or authorized representative who has indicated his/her understanding and acceptance.   Dental advisory given  Plan Discussed with: CRNA, Anesthesiologist and Surgeon  Anesthesia Plan Comments:         Anesthesia Quick Evaluation

## 2014-05-13 NOTE — Interval H&P Note (Signed)
History and Physical Interval Note:  05/13/2014 9:42 AM  [name]  has presented today for surgery, with the diagnosis of chest pain  The various methods of treatment have been discussed with the patient and family. After consideration of risks, benefits and other options for treatment, the patient has consented to  Procedure(s): ESOPHAGOGASTRODUODENOSCOPY (EGD) (N/A) as a surgical intervention .  The patient's history has been reviewed, patient examined, no change in status, stable for surgery.  I have reviewed the patient's chart and labs.  Questions were answered to the patient's satisfaction.     [name] M  Assessment:  1.  Epigastric pain. 2.  History gastric ulcers  Plan:  1.  Endoscopy. 2.  Risks (bleeding, infection, bowel perforation that could require surgery, sedation-related changes in cardiopulmonary systems), benefits (identification and possible treatment of source of symptoms, exclusion of certain causes of symptoms), and alternatives (watchful waiting, radiographic imaging studies, empiric medical treatment) of upper endoscopy (EGD) were explained to patient/family in detail and patient wishes to proceed.

## 2014-05-13 NOTE — Anesthesia Postprocedure Evaluation (Signed)
  Anesthesia Post-op Note  Patient: [name]  Procedure(s) Performed: Procedure(s): ESOPHAGOGASTRODUODENOSCOPY (EGD) (N/A)  Patient Location: PACU  Anesthesia Type: MAC  Level of Consciousness: awake and alert   Airway and Oxygen Therapy: Patient Spontanous Breathing  Post-op Pain: none  Post-op Assessment: Post-op Vital signs reviewed, Patient's Cardiovascular Status Stable and Respiratory Function Stable  Post-op Vital Signs: Reviewed  Filed Vitals:   05/13/14 1013  BP: 148/91  Pulse:   Temp: 36.8 C  Resp: 13    Complications: No apparent anesthesia complications

## 2014-05-13 NOTE — Care Management Note (Signed)
    Page 1 of 1   05/13/2014     4:22:08 PM CARE MANAGEMENT NOTE 05/13/2014  Patient:  [name], [name]   Account Number:  [id number]  Date Initiated:  05/11/2014  Documentation initiated by:  Whitman Hero  Subjective/Objective Assessment:   From home with family admitted PE.     Action/Plan:   Return to home when medically stable. CM to f/u with d/c disposition.   Anticipated DC Date:  05/13/2014   Anticipated DC Plan:  HOME/SELF CARE         Choice offered to / List presented to:             Status of service:  Completed, signed off Medicare Important Message given?  NO (If response is "NO", the following Medicare IM given date fields will be blank) Date Medicare IM given:   Medicare IM given by:   Date Additional Medicare IM given:   Additional Medicare IM given by:    Discharge Disposition:  HOME/SELF CARE  Per UR Regulation:    If discussed at Long Length of Stay Meetings, dates discussed:    Comments:  05/13/2014 @ [place] RN,BSN,CM [phone]  CM consulted for home O2, referral made with Jeneen Rinks @ [employer], oxygen to be delivered to room (3S08). No other needs identified.

## 2014-05-13 NOTE — Progress Notes (Signed)
ANTICOAGULATION CONSULT NOTE   Pharmacy Consult for Heparin>>lovenox Indication: pulmonary embolus  No Known Allergies  Patient Measurements: Height: 5\' 10"  (177.8 cm) Weight: 210 lb 5.1 oz (95.4 kg) IBW/kg (Calculated) : 73  Vital Signs: Temp: 99.1 F (37.3 C) (04/18 1100) Temp Source: Oral (04/18 1100) BP: 126/81 mmHg (04/18 1100) Pulse Rate: 72 (04/18 1100)  Labs:  Recent Labs  05/11/14 0354 05/11/14 0924  05/11/14 1530 05/11/14 1950 05/12/14 0300 05/12/14 1240 05/13/14 0250  HGB 11.6*  --   --   --   --  11.0*  --  10.9*  HCT 35.0*  --   --   --   --  33.6*  --  34.0*  PLT 175  --   --   --   --  188  --  188  LABPROT  --   --   --   --   --   --  14.9 15.7*  INR  --   --   --   --   --   --  1.15 1.23  HEPARINUNFRC  --   --   < >  --  0.52 0.47  --  0.29*  CREATININE 1.65*  --   --   --   --  1.37*  --  1.11  TROPONINI <0.03 <0.03  --  0.03  --   --   --   --   < > = values in this interval not displayed.  Estimated Creatinine Clearance: 68.7 mL/min (by C-G formula based on Cr of 1.11).  Medical History: Past Medical History  Diagnosis Date  . Chronic diastolic CHF (congestive heart failure)     a. His initial ejection fraction was between 30 and 35%;  b. 04/2013 Echo: EF 50-55% Gr1 DD, mild-mod MR.  . Thyroid disease   . Pulmonary HTN   . Hypertension   . Addison's disease   . Hypogonadism male   . CKD (chronic kidney disease), stage III   . History of aortic insufficiency   . Hx of cardiovascular stress test     a. ETT-Myoview 7/14:  Low risk, inferior defect consistent with thinning, no ischemia, normal wall motion, EF 54%  . DVT (deep venous thrombosis) ~ 02/2013    BLE  . GERD (gastroesophageal reflux disease)   . H/O hiatal hernia   . Hepatitis 1959    "think it was a form of C"   . Daily headache   . Chronic back pain     "neck to tailbone" (04/11/2013)  . Peptic ulcer disease     suspected 04/11/2013  . On home oxygen therapy     "2-3L @  night" (04/11/2013)  . Acute lower GI bleeding     admitted to [facility]/11/2013   Assessment: 74 yo man to transition from IV heparin to lovenox for treatment of PE.  INR today 1.23.  S/p EGD today and ok to resume anticoagulation.  Goal of Therapy:  Monitor platelets by anticoagulation protocol: Yes   Plan:  -Lovenox 95 mg sq q12 hours -Coumadin 6 mg po today -Daily PT/INR -Monitor for bleeding, trend Hgb  Jamina Macbeth Poteet 05/13/2014,2:15 PM

## 2014-05-13 NOTE — Transfer of Care (Signed)
Immediate Anesthesia Transfer of Care Note  Patient: [name]  Procedure(s) Performed: Procedure(s): ESOPHAGOGASTRODUODENOSCOPY (EGD) (N/A)  Patient Location: Endoscopy Unit  Anesthesia Type:MAC  Level of Consciousness: awake, alert  and oriented  Airway & Oxygen Therapy: Patient Spontanous Breathing and Patient connected to nasal cannula oxygen  Post-op Assessment: Report given to RN, Post -op Vital signs reviewed and stable and Patient moving all extremities X 4  Post vital signs: Reviewed and stable  Last Vitals:  Filed Vitals:   05/13/14 0847  BP: 167/93  Pulse: 72  Temp: 37.1 C  Resp: 14    Complications: No apparent anesthesia complications

## 2014-05-13 NOTE — Op Note (Signed)
[facility] [place], [address]   ENDOSCOPY PROCEDURE REPORT  PATIENT: [name], [name]  MR#: [id number] BIRTHDATE: 1940-11-20 , 73  yrs. old GENDER: male ENDOSCOPIST: Arta Silence, MD REFERRED BY:  [facility] PROCEDURE DATE:  2014-05-18 PROCEDURE:  EGD, diagnostic ASA CLASS:     Class III INDICATIONS:  epigastric pain, history gastric ulcers. MEDICATIONS: Monitored anesthesia care and Per Anesthesia TOPICAL ANESTHETIC: Cetacaine Spray  DESCRIPTION OF PROCEDURE: After the risks benefits and alternatives of the procedure were thoroughly explained, informed consent was obtained.  The Pentax Gastroscope [id number] endoscope was introduced through the mouth and advanced to the second portion of the duodenum. The instrument was slowly withdrawn as the mucosa was fully examined. Estimated blood loss is zero unless otherwise noted in this procedure report.    Findings:  Small hiatal hernia.  Widely patent Schatzki's ring.  Few pre-pyloric gastric erosions, mild proximal gastritis.  Mild bulbar duodenitis.  Otherwise normal endoscopy to the second portion of the duodenum; no old or fresh blood was identified. The scope was then withdrawn from the patient and the procedure completed.  COMPLICATIONS: There were no immediate complications.  ENDOSCOPIC IMPRESSION:     As above.  No findings seen that would be considered high-risk for bleeding upon initiation of anticoagulation.  Suspect patient's abdominal pain is multifactorial (GERD and Gastritis can cause this type of pain, but he also has reported constipation and told me his pain improves after passage of bowel movement).  RECOMMENDATIONS:     1.  Watch for potential complications of procedure. 2.  Continue pantoprazole 40 mg po qd indefinitely, so long as patient remains on anticoagulation. 3.  Miralax 17 g po qd. 4.  OK to resume anticoagulation, as clinically needed for  his recent pulmonary embolism. 5.  Will sign-off; please call with questions; Dr. Amedeo Plenty can see patient as oupatient if needed upon discharge; thank you for the consultation.  eSigned:  Arta Silence, MD 2014-05-18 10:14 AM   CC:  CPT CODES: ICD CODES:  The ICD and CPT codes recommended by this software are interpretations from the data that the clinical staff has captured with the software.  The verification of the translation of this report to the ICD and CPT codes and modifiers is the sole responsibility of the health care institution and practicing physician where this report was generated.  [place]. will not be held responsible for the validity of the ICD and CPT codes included on this report.  AMA assumes no liability for data contained or not contained herein. CPT is a Designer, television/film set of the [employer].

## 2014-05-13 NOTE — H&P (View-Only) (Signed)
[facility] CONSULT Reason for consult: abdominal and chest pain in gentleman who is currently anticoagulated and has prior history of gastric ulcers Referring Physician: [facility] hospital[facility]. PCP: Dr. Nnodi. Primary G.I.: Dr. Hayes  [name] is an 73 y.o. male.  HPI: he is currently hospitalized in the ICU with acute segmental bilateral pulmonary emboli in DVT in the left lower extremity and anticoagulated. He's had history of previous DVT's and G.I. bleeds. He saw Dr. Hayes and had small gastric ulcers with negative biopsies for H. pylori about a year ago. It was felt that this is probably due to NSAIDs. Dr. Hayes has been following him in the office for chronic severe dyspepsia felt to be due to reflux. He had a repeat EGD at [facility]15 showing erosive gastritis but no ulcerations biopsies negative again for H. pylori. Screening colonoscopy 3/15 was negative for colon polyps, diverticula and positive only for severe melanocytes:.   the patient states in the office records confirm that he has been on multiple PPI's as well as Carafate and metoclopramide for severe dyspepsia. It appears that all of these medications work to some extent but not appear to adequately control of symptoms. He has multiple other medical problems including Addison's disease, history of several DVT's requiring anticoagulation with G.I. bleeding palm Xarelto. He's had chronic back pain and other pains for which he is used NSAIDs in the past. He apparently was a very heavy user of BC powders that he states in his wife confirms that he is not used any BC powders since his 1st DVT and G.I. bleed approximately one year ago. I carefully question them about BC powders, ibuprofen etc. and they insist that he is taking none of these. He's had no melena or hematochezia. He continues to have fairly severe dyspepsia usually worse after eating and most recently has not been on any acid reducing medications since he  states that none of these tender work very long. He has history COPD, chronic congestive heart failure and does require home oxygen. It appears that he has secondary pulmonary hypertension as well. He has CKD. He is currently receiving IV heparin and consideration is being given to changing to Coumadin since he has had significant bleeding in the past with Xarelto. Were asked to see him regarding whether or not he may have any significant lesions in his stomach.  Past Medical History  Diagnosis Date  . Chronic diastolic CHF (congestive heart failure)     a. His initial ejection fraction was between 30 and 35%;  b. 04/2013 Echo: EF 50-55% Gr1 DD, mild-mod MR.  . Thyroid disease   . Pulmonary HTN   . Hypertension   . Addison's disease   . Hypogonadism male   . CKD (chronic kidney disease), stage III   . History of aortic insufficiency   . Hx of cardiovascular stress test     a. ETT-Myoview 7/14:  Low risk, inferior defect consistent with thinning, no ischemia, normal wall motion, EF 54%  . DVT (deep venous thrombosis) ~ 02/2013    BLE  . GERD (gastroesophageal reflux disease)   . H/O hiatal hernia   . Hepatitis 1959    "think it was a form of C"   . Daily headache   . Chronic back pain     "neck to tailbone" (04/11/2013)  . Peptic ulcer disease     suspected 04/11/2013  . On home oxygen therapy     "2-3L @ night" (04/11/2013)  .   Acute lower GI bleeding     admitted to [facility] 04/11/2013    Past Surgical History  Procedure Laterality Date  . Cardiac catheterization  06/26/2008    EF 30-35%  . US echocardiography  04/09/2010    EF 60-65%  . Tonsillectomy  1940's  . Appendectomy  1950's  . Cholecystectomy  ~ 1965  . Esophagogastroduodenoscopy N/A 04/11/2013    Procedure: ESOPHAGOGASTRODUODENOSCOPY (EGD);  Surgeon: Missy Sabins, MD;  Location: [facility] ENDOSCOPY;  Service: Endoscopy;  Laterality: N/A;    Family History  Problem Relation Age of Onset  . Clotting disorder Mother   . Clotting  disorder Brother   . Clotting disorder Brother   . Clotting disorder Brother   . Clotting disorder Other     Niece.    Social History:  reports that he has never smoked. He has never used smokeless tobacco. He reports that he does not drink alcohol or use illicit drugs.  Allergies: No Known Allergies  Medications; Prior to Admission medications   Medication Sig Start Date End Date Taking? Authorizing Provider  amLODipine (NORVASC) 5 MG tablet Take 5 mg by mouth daily.  12/05/12  Yes Historical Provider, MD  carvedilol (COREG) 25 MG tablet TAKE 1 TABLET TWICE A DAY WITH MEALS 07/25/13  Yes Thayer Headings, MD  Cholecalciferol (VITAMIN D3 PO) Take 1,000 capsules by mouth daily.    Yes Historical Provider, MD  furosemide (LASIX) 40 MG tablet Take 1 tablet (40 mg total) by mouth 2 (two) times daily. 05/07/14  Yes Rogelia Mire, NP  gabapentin (NEURONTIN) 600 MG tablet Take 600 mg by mouth daily.   Yes Historical Provider, MD  hydrocortisone (CORTEF) 20 MG tablet Take 20 mg by mouth 3 (three) times daily.    Yes Historical Provider, MD  lisinopril (PRINIVIL,ZESTRIL) 10 MG tablet TAKE 1 TABLET DAILY (NEEDS TO CONTACT OFFICE TO SCHEDULE APPOINTMENT FOR FUTURE REFILLS)   Yes Thayer Headings, MD  metoCLOPramide (REGLAN) 5 MG tablet Take 5 mg by mouth daily.    Yes Historical Provider, MD  potassium chloride (K-DUR,KLOR-CON) 10 MEQ tablet Take 1 tablet (10 mEq total) by mouth 2 (two) times daily. 09/14/13  Yes Thayer Headings, MD  PROAIR HFA 108 (90 BASE) MCG/ACT inhaler INHALE 2 PUFFS INTO THE LUNGS EVERY 6 (SIX) HOURS AS NEEDED FOR WHEEZING OR SHORTNESS OF BREATH. 01/16/14  Yes Collene Gobble, MD  thyroid (ARMOUR) 60 MG tablet Take 60 mg by mouth daily.    Yes Historical Provider, MD  traMADol (ULTRAM) 50 MG tablet Take 50 mg by mouth 3 (three) times daily.    Yes Historical Provider, MD  traZODone (DESYREL) 100 MG tablet Take 50-100 mg by mouth every evening.  04/16/14  Yes Historical Provider,  MD   . amLODipine  5 mg Oral Daily  . carvedilol  25 mg Oral BID WC  . famotidine  20 mg Oral BID  . hydrocortisone  20 mg Oral TID WC  . insulin aspart  0-9 Units Subcutaneous 6 times per day  . metoCLOPramide  5 mg Oral Q breakfast  . pantoprazole  40 mg Oral Daily  . sodium chloride  3 mL Intravenous Q12H  . thyroid  60 mg Oral Daily  . traMADol  50 mg Oral TID  . traZODone  50-100 mg Oral QPM   PRN Meds acetaminophen, albuterol Results for orders placed or performed during the hospital encounter of 05/10/14 (from the past 48 hour(s))  Basic metabolic panel  Status: Abnormal   Collection Time: 05/10/14 11:48 PM  Result Value Ref Range   Sodium 141 135 - 145 mmol/L   Potassium 3.5 3.5 - 5.1 mmol/L   Chloride 103 96 - 112 mmol/L   CO2 24 19 - 32 mmol/L   Glucose, Bld 176 (H) 70 - 99 mg/dL   BUN 26 (H) 6 - 23 mg/dL   Creatinine, Ser 1.88 (H) 0.50 - 1.35 mg/dL   Calcium 9.0 8.4 - 10.5 mg/dL   GFR calc non Af Amer 34 (L) >90 mL/min   GFR calc Af Amer 39 (L) >90 mL/min    Comment: (NOTE) The eGFR has been calculated using the CKD EPI equation. This calculation has not been validated in all clinical situations. eGFR's persistently <90 mL/min signify possible Chronic Kidney Disease.    Anion gap 14 5 - 15  BNP (order ONLY if patient complains of dyspnea/SOB AND you have documented it for THIS visit)     Status: None   Collection Time: 05/10/14 11:48 PM  Result Value Ref Range   B Natriuretic Peptide 26.9 0.0 - 100.0 pg/mL  CBC with Differential     Status: Abnormal   Collection Time: 05/10/14 11:48 PM  Result Value Ref Range   WBC 13.3 (H) 4.0 - 10.5 K/uL    Comment: WHITE COUNT CONFIRMED ON SMEAR   RBC 4.11 (L) 4.22 - 5.81 MIL/uL   Hemoglobin 12.5 (L) 13.0 - 17.0 g/dL   HCT 37.8 (L) 39.0 - 52.0 %   MCV 92.0 78.0 - 100.0 fL   MCH 30.4 26.0 - 34.0 pg   MCHC 33.1 30.0 - 36.0 g/dL   RDW 15.9 (H) 11.5 - 15.5 %   Platelets 225 150 - 400 K/uL   Neutrophils Relative % 78  (H) 43 - 77 %   Lymphocytes Relative 14 12 - 46 %   Monocytes Relative 7 3 - 12 %   Eosinophils Relative 1 0 - 5 %   Basophils Relative 0 0 - 1 %   Neutro Abs 10.4 (H) 1.7 - 7.7 K/uL   Lymphs Abs 1.9 0.7 - 4.0 K/uL   Monocytes Absolute 0.9 0.1 - 1.0 K/uL   Eosinophils Absolute 0.1 0.0 - 0.7 K/uL   Basophils Absolute 0.0 0.0 - 0.1 K/uL   WBC Morphology ATYPICAL LYMPHOCYTES   Troponin I     Status: None   Collection Time: 05/10/14 11:48 PM  Result Value Ref Range   Troponin I <0.03 <0.031 ng/mL    Comment:        NO INDICATION OF MYOCARDIAL INJURY.   D-dimer, quantitative     Status: Abnormal   Collection Time: 05/10/14 11:48 PM  Result Value Ref Range   D-Dimer, Quant 4.80 (H) 0.00 - 0.48 ug/mL-FEU    Comment:        AT THE INHOUSE ESTABLISHED CUTOFF VALUE OF 0.48 ug/mL FEU, THIS ASSAY HAS BEEN DOCUMENTED IN THE LITERATURE TO HAVE A SENSITIVITY AND NEGATIVE PREDICTIVE VALUE OF AT LEAST 98 TO 99%.  THE TEST RESULT SHOULD BE CORRELATED WITH AN ASSESSMENT OF THE CLINICAL PROBABILITY OF DVT / VTE.   CBC WITH DIFFERENTIAL     Status: Abnormal   Collection Time: 05/11/14  3:54 AM  Result Value Ref Range   WBC 12.3 (H) 4.0 - 10.5 K/uL   RBC 3.87 (L) 4.22 - 5.81 MIL/uL   Hemoglobin 11.6 (L) 13.0 - 17.0 g/dL   HCT 35.0 (L) 39.0 - 52.0 %     MCV 90.4 78.0 - 100.0 fL   MCH 30.0 26.0 - 34.0 pg   MCHC 33.1 30.0 - 36.0 g/dL   RDW 15.8 (H) 11.5 - 15.5 %   Platelets 175 150 - 400 K/uL   Neutrophils Relative % 67 43 - 77 %   Neutro Abs 8.2 (H) 1.7 - 7.7 K/uL   Lymphocytes Relative 22 12 - 46 %   Lymphs Abs 2.8 0.7 - 4.0 K/uL   Monocytes Relative 9 3 - 12 %   Monocytes Absolute 1.1 (H) 0.1 - 1.0 K/uL   Eosinophils Relative 2 0 - 5 %   Eosinophils Absolute 0.2 0.0 - 0.7 K/uL   Basophils Relative 0 0 - 1 %   Basophils Absolute 0.0 0.0 - 0.1 K/uL  Comprehensive metabolic panel     Status: Abnormal   Collection Time: 05/11/14  3:54 AM  Result Value Ref Range   Sodium 141 135 - 145  mmol/L   Potassium 2.9 (L) 3.5 - 5.1 mmol/L    Comment: DELTA CHECK NOTED   Chloride 104 96 - 112 mmol/L   CO2 22 19 - 32 mmol/L   Glucose, Bld 103 (H) 70 - 99 mg/dL   BUN 24 (H) 6 - 23 mg/dL   Creatinine, Ser 1.65 (H) 0.50 - 1.35 mg/dL   Calcium 8.4 8.4 - 10.5 mg/dL   Total Protein 6.0 6.0 - 8.3 g/dL   Albumin 3.2 (L) 3.5 - 5.2 g/dL   AST 12 0 - 37 U/L   ALT 16 0 - 53 U/L   Alkaline Phosphatase 50 39 - 117 U/L   Total Bilirubin 0.7 0.3 - 1.2 mg/dL   GFR calc non Af Amer 40 (L) >90 mL/min   GFR calc Af Amer 46 (L) >90 mL/min    Comment: (NOTE) The eGFR has been calculated using the CKD EPI equation. This calculation has not been validated in all clinical situations. eGFR's persistently <90 mL/min signify possible Chronic Kidney Disease.    Anion gap 15 5 - 15  Troponin I     Status: None   Collection Time: 05/11/14  3:54 AM  Result Value Ref Range   Troponin I <0.03 <0.031 ng/mL    Comment:        NO INDICATION OF MYOCARDIAL INJURY.   Magnesium     Status: None   Collection Time: 05/11/14  3:54 AM  Result Value Ref Range   Magnesium 2.1 1.5 - 2.5 mg/dL  CBG monitoring, ED     Status: None   Collection Time: 05/11/14  4:32 AM  Result Value Ref Range   Glucose-Capillary 95 70 - 99 mg/dL  MRSA PCR Screening     Status: None   Collection Time: 05/11/14  5:37 AM  Result Value Ref Range   MRSA by PCR NEGATIVE NEGATIVE    Comment:        The GeneXpert MRSA Assay (FDA approved for NASAL specimens only), is one component of a comprehensive MRSA colonization surveillance program. It is not intended to diagnose MRSA infection nor to guide or monitor treatment for MRSA infections.   Glucose, capillary     Status: None   Collection Time: 05/11/14  7:52 AM  Result Value Ref Range   Glucose-Capillary 88 70 - 99 mg/dL  Troponin I     Status: None   Collection Time: 05/11/14  9:24 AM  Result Value Ref Range   Troponin I <0.03 <0.031 ng/mL    Comment:  NO  INDICATION OF MYOCARDIAL INJURY.   Heparin level (unfractionated)     Status: None   Collection Time: 05/11/14 11:20 AM  Result Value Ref Range   Heparin Unfractionated 0.59 0.30 - 0.70 IU/mL    Comment:        IF HEPARIN RESULTS ARE BELOW EXPECTED VALUES, AND PATIENT DOSAGE HAS BEEN CONFIRMED, SUGGEST FOLLOW UP TESTING OF ANTITHROMBIN III LEVELS.   Glucose, capillary     Status: Abnormal   Collection Time: 05/11/14 11:39 AM  Result Value Ref Range   Glucose-Capillary 101 (H) 70 - 99 mg/dL  Troponin I     Status: None   Collection Time: 05/11/14  3:30 PM  Result Value Ref Range   Troponin I 0.03 <0.031 ng/mL    Comment:        NO INDICATION OF MYOCARDIAL INJURY.   TSH     Status: Abnormal   Collection Time: 05/11/14  3:30 PM  Result Value Ref Range   TSH 0.322 (L) 0.350 - 4.500 uIU/mL  Glucose, capillary     Status: Abnormal   Collection Time: 05/11/14  4:36 PM  Result Value Ref Range   Glucose-Capillary 168 (H) 70 - 99 mg/dL   Comment 1 Notify RN    Comment 2 Document in Chart   Heparin level (unfractionated)     Status: None   Collection Time: 05/11/14  7:50 PM  Result Value Ref Range   Heparin Unfractionated 0.52 0.30 - 0.70 IU/mL    Comment:        IF HEPARIN RESULTS ARE BELOW EXPECTED VALUES, AND PATIENT DOSAGE HAS BEEN CONFIRMED, SUGGEST FOLLOW UP TESTING OF ANTITHROMBIN III LEVELS.   Glucose, capillary     Status: None   Collection Time: 05/11/14  7:52 PM  Result Value Ref Range   Glucose-Capillary 97 70 - 99 mg/dL  Glucose, capillary     Status: Abnormal   Collection Time: 05/11/14 11:50 PM  Result Value Ref Range   Glucose-Capillary 137 (H) 70 - 99 mg/dL   Comment 1 Notify RN   CBC WITH DIFFERENTIAL     Status: Abnormal   Collection Time: 05/12/14  3:00 AM  Result Value Ref Range   WBC 12.1 (H) 4.0 - 10.5 K/uL   RBC 3.69 (L) 4.22 - 5.81 MIL/uL   Hemoglobin 11.0 (L) 13.0 - 17.0 g/dL   HCT 33.6 (L) 39.0 - 52.0 %   MCV 91.1 78.0 - 100.0 fL   MCH  29.8 26.0 - 34.0 pg   MCHC 32.7 30.0 - 36.0 g/dL   RDW 15.6 (H) 11.5 - 15.5 %   Platelets 188 150 - 400 K/uL   Neutrophils Relative % 68 43 - 77 %   Neutro Abs 8.2 (H) 1.7 - 7.7 K/uL   Lymphocytes Relative 21 12 - 46 %   Lymphs Abs 2.5 0.7 - 4.0 K/uL   Monocytes Relative 10 3 - 12 %   Monocytes Absolute 1.2 (H) 0.1 - 1.0 K/uL   Eosinophils Relative 1 0 - 5 %   Eosinophils Absolute 0.2 0.0 - 0.7 K/uL   Basophils Relative 0 0 - 1 %   Basophils Absolute 0.0 0.0 - 0.1 K/uL  Comprehensive metabolic panel     Status: Abnormal   Collection Time: 05/12/14  3:00 AM  Result Value Ref Range   Sodium 139 135 - 145 mmol/L   Potassium 3.4 (L) 3.5 - 5.1 mmol/L   Chloride 107 96 - 112 mmol/L   CO2  23 19 - 32 mmol/L   Glucose, Bld 97 70 - 99 mg/dL   BUN 15 6 - 23 mg/dL   Creatinine, Ser 1.37 (H) 0.50 - 1.35 mg/dL   Calcium 8.0 (L) 8.4 - 10.5 mg/dL   Total Protein 5.6 (L) 6.0 - 8.3 g/dL   Albumin 2.7 (L) 3.5 - 5.2 g/dL   AST 11 0 - 37 U/L   ALT 13 0 - 53 U/L   Alkaline Phosphatase 44 39 - 117 U/L   Total Bilirubin 0.8 0.3 - 1.2 mg/dL   GFR calc non Af Amer 50 (L) >90 mL/min   GFR calc Af Amer 57 (L) >90 mL/min    Comment: (NOTE) The eGFR has been calculated using the CKD EPI equation. This calculation has not been validated in all clinical situations. eGFR's persistently <90 mL/min signify possible Chronic Kidney Disease.    Anion gap 9 5 - 15  Heparin level (unfractionated)     Status: None   Collection Time: 05/12/14  3:00 AM  Result Value Ref Range   Heparin Unfractionated 0.47 0.30 - 0.70 IU/mL    Comment:        IF HEPARIN RESULTS ARE BELOW EXPECTED VALUES, AND PATIENT DOSAGE HAS BEEN CONFIRMED, SUGGEST FOLLOW UP TESTING OF ANTITHROMBIN III LEVELS.   Glucose, capillary     Status: None   Collection Time: 05/12/14  4:03 AM  Result Value Ref Range   Glucose-Capillary 92 70 - 99 mg/dL   Comment 1 Notify RN   Glucose, capillary     Status: None   Collection Time: 05/12/14   8:19 AM  Result Value Ref Range   Glucose-Capillary 79 70 - 99 mg/dL   Comment 1 Notify RN    Comment 2 Document in Chart   Glucose, capillary     Status: None   Collection Time: 05/12/14 11:13 AM  Result Value Ref Range   Glucose-Capillary 90 70 - 99 mg/dL   Comment 1 Notify RN    Comment 2 Document in Chart     Ct Angio Chest Pe W/cm &/or Wo Cm  05/11/2014   CLINICAL DATA:  Shortness of breath, elevated D-dimer. Dyspnea on exertion. History of DVT.  EXAM: CT ANGIOGRAPHY CHEST WITH CONTRAST  TECHNIQUE: Multidetector CT imaging of the chest was performed using the standard protocol during bolus administration of intravenous contrast. Multiplanar CT image reconstructions and MIPs were obtained to evaluate the vascular anatomy.  CONTRAST:  75mL OMNIPAQUE IOHEXOL 350 MG/ML SOLN  COMPARISON:  Chest radiograph earlier this date.  FINDINGS: There is filling defects within the right and left pulmonary arterial system consistent pulmonary embolus. On the left this involves the left lower lobe pulmonary artery extending into the subsegmental branches primarily posterior basal. Thrombus extends into the left upper lobe and lingular segmental arteries. On the right there is thrombus in the right upper lobe pulmonary artery extending into the segmental and subsegmental branches, right middle lobe segmental and subsegmental branches, and subsegmental branches of the basilar lower lobes. There is right heart strain with a RV to LV ratio of 1.4.  The thoracic aorta is normal in caliber with mild atherosclerosis. Coronary artery calcifications are seen. There is no pericardial or pleural effusion. No definite pulmonary infarct. There peripheral linear opacities in the lower lobes, lingula, and right middle lobes, likely atelectasis.  Evaluation of the upper abdomen demonstrates splenic granuloma. Postcholecystectomy clips noted.  There are no acute or suspicious osseous abnormalities.  Review of the MIP images    confirms the above findings.  IMPRESSION: Positive for bilateral acute PE with CT evidence of right heart strain (RV/LV Ratio = 1.0) consistent with at least submassive (intermediate risk) PE. The presence of right heart strain has been associated with an increased risk of morbidity and mortality. Please activate Code PE by paging [phone].  Critical Value/emergent results were called by telephone at the time of interpretation on 05/11/2014 at 2:29 am to [place] , who verbally acknowledged these results.   Electronically Signed   By: Jeb Levering M.D.   On: 05/11/2014 02:29   Dg Chest Port 1 View  05/11/2014   CLINICAL DATA:  Shortness of breath.  EXAM: PORTABLE CHEST - 1 VIEW  COMPARISON:  06/22/2013  FINDINGS: Cardiomediastinal contours are unchanged allowing for differences in technique. There is minimal right greater than left basilar atelectasis or scarring. Pulmonary vasculature is normal. No consolidation, pleural effusion, or pneumothorax. No acute osseous abnormalities are seen.  IMPRESSION: Mild right greater than left bibasilar atelectasis or scarring.   Electronically Signed   By: Jeb Levering M.D.   On: 05/11/2014 00:48   ROS: Constitutional:chronic weakness and fatigue in shortness of breath with exertion HEENT: negative  Cardiovascular: chronic chest pain probably reflux for dyspepsia with coronary history noted above Respiratory: COPD requiring oxygen height with chronic shortness of breath  GI:as above  GU: negative  Musculoskeletal:chronic back pain and musculoskeletal pain.  Neuro/Psychiatric: negative  Endocrine/Heme: hypothyroidism, Addison's disease, history of coagulopathy personally as well as family history            Blood pressure 153/86, pulse 78, temperature 99.2 F (37.3 C), temperature source Oral, resp. rate 15, height 5' 10" (1.778 m), weight 95.4 kg (210 lb 5.1 oz), SpO2 98 %.  Physical exam:   General-- alert male who is somewhat  short of breath unable to talk without difficulty ENT--nonicteric mucous membranes moist Neck-- somewhat chubby neck no acute distress  Heart--regular rate and rhythm without murmurs are gallops Lungs-- clear Abdomen-- soft and nontender  Psych-- alert and oriented and appropriate    Assessment: 1. Bilateral pulmonary emboli and recurrent DVT. Patient until anticoagulation. Will need to be changed to Coumadin. 2. Chronic chest pain and dyspepsia felt to be due to reflux that has been refractory multiple different medications. This also had history of chronic gastritis and gastric ulcers. I agree EGD would be appropriate prior to initiating Coumadin therapy. 3. Pulmonary hypertension on chronic 02  Plan: 1. We will hold his heparin infusion in the morning and he will undergo EGD by Dr. Paulita Fujita at 9 AM. Have discussed this with the patient and his wife. He had full colonoscopy 1 year ago it was negative other than melanoma sis and I do not think I would repeat that at this time.  2. I would go ahead with stool Hemoccult just to be sure that there is not ongoing G.I. bleeding.  3. Since he has had routine clotting abnormalities in a strongly positive family history of clotting abnormalities, would question if he needs to be seen by hematology. I presume that this may have been done in the past but cannot find any evidence of that in epic.    Trystyn Sitts JR,Cashawn Yanko L 05/12/2014, 12:36 PM

## 2014-05-13 NOTE — Anesthesia Procedure Notes (Signed)
Procedure Name: MAC Date/Time: 05/13/2014 9:48 AM Performed by: Garrison [place] T Pre-anesthesia Checklist: Patient identified, Emergency Drugs available, Suction available and Patient being monitored Patient Re-evaluated:Patient Re-evaluated prior to inductionOxygen Delivery Method: Simple face mask Preoxygenation: Pre-oxygenation with 100% oxygen Intubation Type: IV induction Placement Confirmation: positive ETCO2 and breath sounds checked- equal and bilateral Dental Injury: Teeth and Oropharynx as per pre-operative assessment

## 2014-05-13 NOTE — Progress Notes (Signed)
ANTICOAGULATION CONSULT NOTE - Initial Consult  Pharmacy Consult for Heparin  Indication: pulmonary embolus  No Known Allergies  Patient Measurements: Height: 5\' 10"  (177.8 cm) Weight: 210 lb 5.1 oz (95.4 kg) IBW/kg (Calculated) : 73  Vital Signs: Temp: 98.6 F (37 C) (04/18 0343) Temp Source: Oral (04/18 0343) BP: 133/78 mmHg (04/17 2334) Pulse Rate: 77 (04/17 2334)  Labs:  Recent Labs  05/10/14 2348 05/11/14 0354 05/11/14 0924  05/11/14 1530 05/11/14 1950 05/12/14 0300 05/12/14 1240 05/13/14 0250  HGB 12.5* 11.6*  --   --   --   --  11.0*  --  10.9*  HCT 37.8* 35.0*  --   --   --   --  33.6*  --  34.0*  PLT 225 175  --   --   --   --  188  --  188  LABPROT  --   --   --   --   --   --   --  14.9 15.7*  INR  --   --   --   --   --   --   --  1.15 1.23  HEPARINUNFRC  --   --   --   < >  --  0.52 0.47  --  0.29*  CREATININE 1.88* 1.65*  --   --   --   --  1.37*  --   --   TROPONINI <0.03 <0.03 <0.03  --  0.03  --   --   --   --   < > = values in this interval not displayed.  Estimated Creatinine Clearance: 55.7 mL/min (by C-G formula based on Cr of 1.37).  Medical History: Past Medical History  Diagnosis Date  . Chronic diastolic CHF (congestive heart failure)     a. His initial ejection fraction was between 30 and 35%;  b. 04/2013 Echo: EF 50-55% Gr1 DD, mild-mod MR.  . Thyroid disease   . Pulmonary HTN   . Hypertension   . Addison's disease   . Hypogonadism male   . CKD (chronic kidney disease), stage III   . History of aortic insufficiency   . Hx of cardiovascular stress test     a. ETT-Myoview 7/14:  Low risk, inferior defect consistent with thinning, no ischemia, normal wall motion, EF 54%  . DVT (deep venous thrombosis) ~ 02/2013    BLE  . GERD (gastroesophageal reflux disease)   . H/O hiatal hernia   . Hepatitis 1959    "think it was a form of C"   . Daily headache   . Chronic back pain     "neck to tailbone" (04/11/2013)  . Peptic ulcer disease      suspected 04/11/2013  . On home oxygen therapy     "2-3L @ night" (04/11/2013)  . Acute lower GI bleeding     admitted to [facility] 04/11/2013   Assessment: Slightly sub-therapeutic heparin level, no issues per RN.   Goal of Therapy:  Heparin level 0.3-0.7 units/ml Monitor platelets by anticoagulation protocol: Yes   Plan:  -Increase heparin drip to 1300 units/hr -1100 HL -Daily CBC/HL -Monitor for bleeding, trend Hgb  Narda Bonds 05/13/2014,3:59 AM

## 2014-05-13 NOTE — Progress Notes (Signed)
DC instructions given to patient, questions answered; Rx given to patient. Pt administered own lovenox injection, tolerated well. Pt watched blood thinner video and given coumadin packet, explained importance of consistent diet, INR checks, medication dosing. Pt completed DME O2 assessment, CM to set up for home O2. VSS. All belongings sent home with patient. eICU and CCMD notified of discharge. PIV DC, hemostasis achieved. Will continue to monitor until time of discharge. Delayed DC due to patient request for spouse to be present for DC instructions.    1815: Pt escorted by RN via wheelchair to private vehicle driven by spouse.

## 2014-05-13 NOTE — Consult Note (Signed)
Name: [name] MRN: [id number] DOB: 28-Mar-1940    ADMISSION DATE:  05/10/2014 CONSULTATION DATE:  05/13/2014   REFERRING MD :  TRH CHIU  CHIEF COMPLAINT:   Acute on chronic dyspnea  BRIEF PATIENT DESCRIPTION:  74 year old male with acute segmental and subsegmental pulmonary emboli bilateral with prior history of deep venous thrombosis left lower extremity one year previous. Patient admitted for heparinization. Pulmonary was asked to give guidance and input on to treatment plan. Previous history of upper GI bleed in the past due to nonsteroidal anti-inflammatory use (BC powders)  SIGNIFICANT EVENTS    STUDIES:  CT  chest  scan 05/11/2014: Acute bilateral pulmonary emboli seen Echocardiogram 05/10/2014: No significant right heart strain visualized   HISTORY OF PRESENT ILLNESS:   This 74 year old male with prior history of multiple medical problems including Addison's disease, diastolic heart failure, history of deep venous thrombosis left lower extremity, GI bleeding while on Xarelto secondary to associated nonsteroidal use with upper GI ulceration. History of stage IIIc KD aortic insufficiency. Patient presents now with progressive dyspnea on exertion and at rest of the past 3-4 weeks. The patient was initially thought to have an upper respiratory tract infection but despite antibiotics the patient did not improve. Patient was seen back with a primary care physician and sent the patient to cardiology who then did an evaluation for 15/16 which showed d-dimer is markedly elevated and patient was directed from there to the emergency room for further evaluation. Note echocardiogram done at that time showed to be normal. The patient is noted to have had a left lower extremity deep venous thrombosis 1 year ago and had to stop Xarelto secondary to GI bleeding.  In the emergency room a CT angios showed bilateral segmental and subsegmental pulmonary emboli. Venous Doppler ultrasound of  lower extremities is pending. Patient admitted with IV heparin treatment. Pulmonary was asked to see the patient to see if this would be a patient candidate for catheter directed thrombo-lysis.  SUBJECTIVE:  The patient is resting comfortably on a heparin drip  VITAL SIGNS: Temp:  [98.2 F (36.8 C)-99.5 F (37.5 C)] 99.1 F (37.3 C) (04/18 1100) Pulse Rate:  [66-86] 72 (04/18 1100) Resp:  [13-17] 14 (04/18 1100) BP: (123-186)/(62-100) 126/81 mmHg (04/18 1100) SpO2:  [93 %-98 %] 93 % (04/18 1100)  PHYSICAL EXAMINATION: General:  Patient in no acute distress Neuro:  Awake and alert moves all fours HEENT:  No jugular venous distention no lymphadenopathy no thyromegaly Cardiovascular:  Regular rate and rhythm normal S1-S2 no S3 or S4 Lungs:  Clear bilaterally without wheeze rale or rhonchi Abdomen:  Soft nontender bowel sounds active Musculoskeletal:  Full range of motion no joint deformity Skin:  Clear   Recent Labs Lab 05/11/14 0354 05/12/14 0300 05/13/14 0250  NA 141 139 143  K 2.9* 3.4* 3.7  CL 104 107 113*  CO2 22 23 19   BUN 24* 15 12  CREATININE 1.65* 1.37* 1.11  GLUCOSE 103* 97 102*    Recent Labs Lab 05/11/14 0354 05/12/14 0300 05/13/14 0250  HGB 11.6* 11.0* 10.9*  HCT 35.0* 33.6* 34.0*  WBC 12.3* 12.1* 9.8  PLT 175 188 188   No results found.  ASSESSMENT / PLAN: Principal Problem:   Pulmonary embolus Active Problems:   Hypothyroidism   ADDISON'S ANEMIA   Chronic systolic CHF (congestive heart failure)   CKD (chronic kidney disease) stage 3, GFR 30-59 ml/min   Peptic ulcer disease   #1 acute pulmonary emboli with segmental  and subsegmental blood clots. #2 R/O DVT #3 GI bleeding  Discussion:  Venous Doppler ultrasound of lower extremity is pending. There is mild  right heart strain on CT angiogram but it is not significant enough to warrant catheter directed thrombolytic lysis.  Previous history of left lower extremity deep venous thrombosis 1  year ago suggests a tendency for clotting. Specific clotting evaluation will need to be delayed until patient is out of the acute setting.  With history of chronic kidney disease and low creatinine clearance and lack of reversal agent and prior history of GI bleeding warfarin would be the best anticoagulant for choice in the short-term.  Note all of the Noac agents will have reversal therapy available by summer 2016   Recommendations:   Continue IV heparin with transitioning to Lovenox injections   Initiate warfarin therapy   Follow daily CBC   Add Pepcid therapy twice a day   Follow-up results of venous Doppler ultrasound   Mobilization of the patient is acceptable at this time   Pulmonary can follow this patient in the outpatient setting but also the patient could be    referred to the [facility] anticoagulation clinic  I reviewed the CT myself evidence of PE noted.  PE: continue heparin, start warfarin therapy today.  No catheter directed lysis.  DVT: as above.  GI bleeding in the setting of anti-coag, consult GI, if needs be may stop anticoagulation and place an IVC filter if recommended by GI.  PCCM will sign off, please call back if needed.  Rush Farmer, M.D. [facility] Pulmonary/Critical Care Medicine. Pager: [phone]. After hours pager: [phone].  05/13/2014, 1:30 PM

## 2014-05-14 ENCOUNTER — Encounter (HOSPITAL_COMMUNITY): Payer: Self-pay | Admitting: Gastroenterology

## 2014-05-15 ENCOUNTER — Telehealth: Payer: Self-pay | Admitting: Emergency Medicine

## 2014-05-15 NOTE — Telephone Encounter (Signed)
Spoke with [name]. I have scheduled pt with TP on 05/17/14 at 9am. Nothing further was needed.

## 2014-05-17 ENCOUNTER — Ambulatory Visit (INDEPENDENT_AMBULATORY_CARE_PROVIDER_SITE_OTHER): Payer: BLUE CROSS/BLUE SHIELD | Admitting: Adult Health

## 2014-05-17 ENCOUNTER — Encounter: Payer: Self-pay | Admitting: Adult Health

## 2014-05-17 VITALS — BP 120/84 | HR 77 | Temp 98.7°F | Ht 69.0 in | Wt 210.4 lb

## 2014-05-17 DIAGNOSIS — I2699 Other pulmonary embolism without acute cor pulmonale: Secondary | ICD-10-CM | POA: Diagnosis not present

## 2014-05-17 DIAGNOSIS — K279 Peptic ulcer, site unspecified, unspecified as acute or chronic, without hemorrhage or perforation: Secondary | ICD-10-CM | POA: Diagnosis not present

## 2014-05-17 DIAGNOSIS — J9610 Chronic respiratory failure, unspecified whether with hypoxia or hypercapnia: Secondary | ICD-10-CM | POA: Insufficient documentation

## 2014-05-17 DIAGNOSIS — J9611 Chronic respiratory failure with hypoxia: Secondary | ICD-10-CM

## 2014-05-17 NOTE — Assessment & Plan Note (Signed)
Ambulatory hypoxia in the setting of PE Patient is to use oxygen with activity to keep saturations above 90%.

## 2014-05-17 NOTE — Patient Instructions (Signed)
Continue on Lovenox and Coumadin Bridge as directed  You will need your coumadin (INR ) checked today at the coumadin clinic at Dr. Harrington Challenger office.  Follow up Dr. Harrington Challenger today .  We are referring you to hematology for recurrent blood clots.  follow up Dr. Lamonte Sakai  In 4-6 weeks and As needed   Please contact office for sooner follow up if symptoms do not improve or worsen or seek emergency care  Report any bleeding immediately.

## 2014-05-17 NOTE — Progress Notes (Signed)
Subjective:    Patient ID: [name], male    DOB: 01-Mar-1940, 74 y.o.   MRN: [id number]  74 yo man, never smoker, hx HTN, diastolic dysfxn, Addison's dz, B LE DVT, GERD / PUD / GIB with anemia (Hgb 9.4-10.0). Xarelto was stopped due to the GIB (finished 5-6 weeks). 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 Better on last TTE 4/'15). Referred today for exertional SOB. No hx asthma or childhood PNA's.   He is dyspneic with exertion, progressive over several years. He has a very hoarse voice, occasionally loses his voice. He feels a weakness in his legs when he carries groceries. Sometimes hears wheezing, doesn't cough. Denies CP. He is feels GERD sx, on protonix. He is on an ACE-i.   ROV 06/27/13 -- follows for dyspnea. He has had V/q > low prob for chronic PE, Full PFT done 06/22/13 >> mild AFL, normal volumes, decrease DLCO that corrects for Va.  His breathing is about the same. No cough but still has a hoarse voice.   05/17/2014 [facility] follow up  Patient returns for a post hospital follow-up Patient was admitted April 15 through April 18 for acute pulmonary embolus and DVT. Patient presented with shortness of breath. CT chest showed acute bilateral pulmonary emboli. Echo showed no significant right heart strain. Venous Doppler showed a new right lower extremity DVT. Patient has a history of previous DVT 1 year ago. He did have a GI bleed while on Xarelto. This was felt secondary to associated nostril use with upper GI ulceration. Patient was seen by GI and underwent an upper endoscopy that showed gastric erosions mild proximal gastritis . He was recommended to use Protonix daily. Patient was treated with heparin with transition to Lovenox and Coumadin bridge. He was set up for oxygen with activity. However, has not been using this since discharge. Patient says that he is improved with decreased shortness of breath. However, continues to get winded intermittently, especially  with activity. He denies any signs of bleeding. He is currently on Lovenox and Coumadin. He denies any chest pain, palpitations, orthopnea, PND, extremity weakness, slurred speech, visual or speech changes, amoxicillin, hematuria, bloody stools. He does complain he has been having intermittent headaches, but denies any neurological changes. Patient has an appointment with his primary care physician today. We discussed that he will need lab work including his INR and Coumadin follow-ups at the Coumadin clinic at [facility]. Says he has a family history of blood clots, discussed referral to hematology.   Review of Systems  Constitutional: Negative for fever and unexpected weight change.  HENT: Negative for congestion, dental problem, ear pain, nosebleeds, postnasal drip, rhinorrhea, sinus pressure, sneezing, sore throat and trouble swallowing.   Eyes: Negative for redness and itching.  Respiratory: Positive for shortness of breath Negative for cough and chest tightness.   Cardiovascular: Negative for palpitations  Gastrointestinal: Negative for nausea and vomiting.  Genitourinary: Negative for dysuria.  Musculoskeletal: Negative for joint swelling.  Skin: Negative for rash.  Neurological: Negative for headaches.  Hematological: Does not bruise/bleed easily.  Psychiatric/Behavioral: Negative for dysphoric mood. The patient is not nervous/anxious.     ROS:   Constitutional:   No  weight loss, night sweats,  Fevers, chills, + fatigue, or  lassitude.  HEENT:   Difficulty swallowing,  Tooth/dental problems, or  Sore throat,                No sneezing, itching, ear ache, nasal congestion, post  nasal drip,   CV:  No chest pain,  Orthopnea, PND, anasarca, dizziness, palpitations, syncope.   GI  No heartburn, indigestion, abdominal pain, nausea, vomiting, diarrhea, change in bowel habits, loss of appetite, bloody stools.   Resp:   No chest wall deformity.  Skin: no rash or lesions.  GU: no  dysuria, change in color of urine, no urgency or frequency.  No flank pain, no hematuria   MS:  No joint pain or swelling.  No decreased range of motion.  No back pain.  Psych:  No change in mood or affect. No depression or anxiety.  No memory loss.  Neuro : no visual/speech changes       EXAM:  Gen: Pleasant, well-nourished, in no distress,  normal affect  ENT: No lesions,  mouth clear,  oropharynx clear, no postnasal drip  Neck: No JVD, no TMG, no carotid bruits  Lungs: No use of accessory muscles, no dullness to percussion, clear without rales or rhonchi  Cardiovascular: RRR, heart sounds normal, no murmur or gallops, tr peripheral edema  Musculoskeletal: No deformities, no cyanosis or clubbing  Neuro: alert, non focal  Skin: Warm, no lesions or rashes       Assessment & Plan:

## 2014-05-17 NOTE — Assessment & Plan Note (Signed)
Acute pulmonary embolus on CT 05/10/2014 Patient has a history of recurrent DVT . 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 Patient will continue on Lovenox and Coumadin bridge. He has a appointment with his primary care physician at [place] today Coumadin monitoring will be done at this office. She is advised to report any signs of bleeding immediately. recommended he use oxygen with activity He will follow up at our office in 4-6 weeks with Dr. Lamonte Sakai

## 2014-05-17 NOTE — Assessment & Plan Note (Signed)
Continue on Protonix daily Avoid all nonsteroidals Follow with GI as directed

## 2014-05-20 ENCOUNTER — Encounter: Payer: Self-pay | Admitting: Nurse Practitioner

## 2014-05-20 ENCOUNTER — Ambulatory Visit (INDEPENDENT_AMBULATORY_CARE_PROVIDER_SITE_OTHER): Payer: BLUE CROSS/BLUE SHIELD | Admitting: Nurse Practitioner

## 2014-05-20 ENCOUNTER — Ambulatory Visit (INDEPENDENT_AMBULATORY_CARE_PROVIDER_SITE_OTHER): Payer: BLUE CROSS/BLUE SHIELD | Admitting: Pharmacist

## 2014-05-20 VITALS — BP 114/72 | HR 84 | Ht 70.0 in | Wt 211.8 lb

## 2014-05-20 DIAGNOSIS — I2699 Other pulmonary embolism without acute cor pulmonale: Secondary | ICD-10-CM

## 2014-05-20 DIAGNOSIS — I5032 Chronic diastolic (congestive) heart failure: Secondary | ICD-10-CM

## 2014-05-20 DIAGNOSIS — I82409 Acute embolism and thrombosis of unspecified deep veins of unspecified lower extremity: Secondary | ICD-10-CM | POA: Insufficient documentation

## 2014-05-20 DIAGNOSIS — I10 Essential (primary) hypertension: Secondary | ICD-10-CM

## 2014-05-20 DIAGNOSIS — I82401 Acute embolism and thrombosis of unspecified deep veins of right lower extremity: Secondary | ICD-10-CM | POA: Diagnosis not present

## 2014-05-20 DIAGNOSIS — Z86711 Personal history of pulmonary embolism: Secondary | ICD-10-CM | POA: Insufficient documentation

## 2014-05-20 LAB — POCT INR: INR: 6.9

## 2014-05-20 LAB — PROTIME-INR
INR: 7 ratio — AB (ref 0.8–1.0)
Prothrombin Time: 73.2 s (ref 9.6–13.1)

## 2014-05-20 NOTE — Progress Notes (Signed)
Patient Name: [name] Date of Encounter: 05/20/2014  Primary Care Provider:   Melinda Crutch, MD Primary Cardiologist:  Joaquim Nam, MD   Chief Complaint  74 year old male who presents for hospital follow-up related to recent admission for bilateral PE and right lower extremity DVT.  Past Medical History   Past Medical History  Diagnosis Date  . Chronic diastolic CHF (congestive heart failure)     a. His initial ejection fraction was between 30 and 35%;  b. 04/2013 Echo: EF 50-55% Gr1 DD, mild-mod MR;  c. 04/2014 Echo: EF 60-65%, Gr 1 DD, triv TR.  Marland Kitchen Thyroid disease   . Pulmonary HTN   . Hypertension   . Addison's disease   . Hypogonadism male   . CKD (chronic kidney disease), stage III   . History of aortic insufficiency   . Hx of cardiovascular stress test     a. ETT-Myoview 7/14:  Low risk, inferior defect consistent with thinning, no ischemia, normal wall motion, EF 54%  . DVT (deep venous thrombosis)     a. 02/2013 Bilat LE-->Xarelto later d/c'd 2/2 GIB;  b. 04/2014 Acute Right PT, Peroneal, Popliteal, Femoral, and common femoral DVT-->coumadoin  . GERD (gastroesophageal reflux disease)   . H/O hiatal hernia   . Hepatitis     a. 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  . Daily headache   . Chronic back pain   . Peptic ulcer disease   . On home oxygen therapy     a. added 04/2014 in setting of PE.  Marland Kitchen Acute lower GI bleeding     a. admitted to [facility] 04/11/2013;  b. 04/2014 EGD: 1.  gastric erosions, mild prox gastritis and bulbar duodenitis->Protonix.  . Pulmonary embolism     a. 04/2014 CTA: Bilat PE w/ right heart strain-->coumadin.   Past Surgical History  Procedure Laterality Date  . Cardiac catheterization  06/26/2008    EF 30-35%  . US echocardiography  04/09/2010    EF 60-65%  . Tonsillectomy  1940's  . Appendectomy  1950's  . Cholecystectomy  ~ 1965  . Esophagogastroduodenoscopy N/A 04/11/2013    Procedure: ESOPHAGOGASTRODUODENOSCOPY (EGD);  Surgeon: Missy Sabins, MD;  Location: [facility]  ENDOSCOPY;  Service: Endoscopy;  Laterality: N/A;  . Esophagogastroduodenoscopy N/A 05/13/2014    Procedure: ESOPHAGOGASTRODUODENOSCOPY (EGD);  Surgeon: Arta Silence, MD;  Location: [facility] ENDOSCOPY;  Service: Endoscopy;  Laterality: N/A;    Allergies  No Known Allergies  HPI  74 year old male with the above complex problem list. I last saw him in clinic about 2 weeks ago, at which time he reported a 4 week history of dyspnea and lower extremity edema, which was initially treated as an upper respiratory infection. He did have evidence of volume overload on exam and we increased his Lasix. He was seen 3 days later secondary to ongoing dyspnea despite diuresis. D-dimer was drawn and was elevated at 4. He was referred to the hospital for CT angiography. This was performed early on April 16 and revealed bilateral submassive pulmonary emboli. He was admitted to medicine and placed on heparin. Radical care in interventional radiology were consulted and did not feel that he met criteria for TPA. He was hemodynamically stable throughout his hospitalization and converted to Coumadin. Prior to this, he was seen by GI given prior history of GI bleeding and an EGD was performed on April 18 and showed gastric erosions without evidence of active bleeding. PPI therapy was prescribed.  Since his discharge, he has continued to  have dyspnea with minimal exertion. He has been on Lovenox bridging and finish his last Lovenox injection yesterday morning. He has not yet had her INR checked. He is not having chest pain, palpitations, PND, orthopnea, dizziness, syncope, edema, or early satiety. Previous symptoms of volume overload including increasing abdominal girth and edema have more or less resolved.  Home Medications  Prior to Admission medications   Medication Sig Start Date End Date Taking? Authorizing Provider  amLODipine (NORVASC) 5 MG tablet Take 5 mg by mouth daily.  12/05/12  Yes Historical Provider, MD    carvedilol (COREG) 25 MG tablet TAKE 1 TABLET TWICE A DAY WITH MEALS 07/25/13  Yes Thayer Headings, MD  Cholecalciferol (VITAMIN D3 PO) Take 1,000 capsules by mouth daily.    Yes Historical Provider, MD  enoxaparin (LOVENOX) 100 MG/ML injection Inject 1 mL (100 mg total) into the skin every 12 (twelve) hours. 05/13/14  Yes Donne Hazel, MD  furosemide (LASIX) 40 MG tablet Take 1 tablet (40 mg total) by mouth 2 (two) times daily. 05/07/14  Yes Rogelia Mire, NP  gabapentin (NEURONTIN) 600 MG tablet Take 600 mg by mouth daily.   Yes Historical Provider, MD  hydrocortisone (CORTEF) 20 MG tablet Take 20 mg by mouth 3 (three) times daily.    Yes Historical Provider, MD  lisinopril (PRINIVIL,ZESTRIL) 10 MG tablet TAKE 1 TABLET DAILY (NEEDS TO CONTACT OFFICE TO SCHEDULE APPOINTMENT FOR FUTURE REFILLS)   Yes Thayer Headings, MD  metoCLOPramide (REGLAN) 5 MG tablet Take 5 mg by mouth daily.    Yes Historical Provider, MD  pantoprazole (PROTONIX) 40 MG tablet Take 1 tablet (40 mg total) by mouth daily. 05/13/14  Yes Donne Hazel, MD  potassium chloride (K-DUR,KLOR-CON) 10 MEQ tablet Take 1 tablet (10 mEq total) by mouth 2 (two) times daily. 09/14/13  Yes Thayer Headings, MD  PROAIR HFA 108 (90 BASE) MCG/ACT inhaler INHALE 2 PUFFS INTO THE LUNGS EVERY 6 (SIX) HOURS AS NEEDED FOR WHEEZING OR SHORTNESS OF BREATH. 01/16/14  Yes Collene Gobble, MD  promethazine (PHENERGAN) 25 MG tablet Take 25 mg by mouth every 6 (six) hours as needed. 05/17/14  Yes Historical Provider, MD  thyroid (ARMOUR) 60 MG tablet Take 60 mg by mouth daily.    Yes Historical Provider, MD  traMADol (ULTRAM) 50 MG tablet Take 50 mg by mouth 3 (three) times daily.    Yes Historical Provider, MD  traZODone (DESYREL) 100 MG tablet Take 50-100 mg by mouth every evening.  04/16/14  Yes Historical Provider, MD  warfarin (COUMADIN) 6 MG tablet Take 1 tablet (6 mg total) by mouth one time only at 6 PM. 05/13/14  Yes Donne Hazel, MD    Review of  Systems  As above, he continues to experience DOE.  He denies chest pain, palpitations, pnd, orthopnea, n, v, dizziness, syncope, edema, weight gain, or early satiety. All other systems reviewed and are otherwise negative except as noted above.  Physical Exam  VS:  BP 114/72 mmHg  Pulse 84  Ht _0  (1.778 m)  Wt 211 lb 12.8 oz (96.072 kg)  BMI 30.39 kg/m2  SpO2 97% , BMI Body mass index is 30.39 kg/(m^2). GEN: Well nourished, well developed, in no acute distress. HEENT: normal. Neck: Supple, no JVD, carotid bruits, or masses. Cardiac: RRR, no murmurs, rubs, or gallops. No clubbing, cyanosis.  Trace bilat LE edema.  Radials/DP/PT 2+ and equal bilaterally.  Respiratory:  Respirations regular and unlabored, clear  to auscultation bilaterally. GI: Soft, nontender, nondistended, BS + x 4. MS: no deformity or atrophy. Skin: warm and dry, no rash. Neuro:  Strength and sensation are intact. Psych: Normal affect.  Accessory Clinical Findings  INR pending today  Assessment & Plan  1.  Bilateral pulmonary emboli/right lower extremity DVT: The patient was recently hospitalized secondary to ongoing dyspnea despite adequate diuresis with finding of an elevated d-dimer. CT angiography didn't fact show bilateral submassive PE with RV strain in the setting of extensive right lower extremity DVT. Interestingly, echocardiogram performed the same day did not show significant RV dysfunction. At no point has the patient had tachycardia or significant hypoxia though he does continue to have dyspnea on exertion. He was discharged home with a Lovenox bridge however INR follow-up was not arranged. We will therefore have an INR checked today and plan to follow up INRs in our Coumadin clinic going forward. If INR is subtherapeutic today, he will require ongoing Lovenox bridging. At some point, he will require hematology referral for additional workup of hypercoagulable state, and this can be arranged through his  primary care office.  2. Chronic diastolic congestive heart failure: Volume appears stable today. Heart rate and blood pressure stable. Continue Lasix at current dose.  3. Hypertension: Stable on current regimen including blocker, ACE inhibitor, and calcium channel blocker therapy.  4. Disposition: I will set him up in Coumadin clinic to be seen today. He has pulmonology follow-up in a few weeks and I will arrange for follow-up with [name] in 6 months.   Murray Hodgkins, NP 05/20/2014, 12:52 PM

## 2014-05-20 NOTE — Patient Instructions (Signed)
Medication Instructions:  Your physician recommends that you continue on your current medications as directed. Please refer to the Current Medication list given to you today.  Labwork: NONE  Testing/Procedures: NONE  Follow-Up: Your physician wants you to follow-up in: 6 months with [name]. You will receive a reminder letter in the mail two months in advance. If you don't receive a letter, please call our office to schedule the follow-up appointment.

## 2014-05-23 ENCOUNTER — Ambulatory Visit (INDEPENDENT_AMBULATORY_CARE_PROVIDER_SITE_OTHER): Payer: BLUE CROSS/BLUE SHIELD | Admitting: *Deleted

## 2014-05-23 ENCOUNTER — Telehealth: Payer: Self-pay | Admitting: *Deleted

## 2014-05-23 DIAGNOSIS — I2699 Other pulmonary embolism without acute cor pulmonale: Secondary | ICD-10-CM | POA: Diagnosis not present

## 2014-05-23 DIAGNOSIS — Z5181 Encounter for therapeutic drug level monitoring: Secondary | ICD-10-CM

## 2014-05-23 DIAGNOSIS — I82401 Acute embolism and thrombosis of unspecified deep veins of right lower extremity: Secondary | ICD-10-CM

## 2014-05-23 LAB — POCT INR: INR: 3.2

## 2014-05-23 NOTE — Telephone Encounter (Signed)
Patient in office today for INR. INR today 3.2 Patient c/o shortness of breath but states no worse than it has been since d/c from hospital 05/13/14 with PE Patient was seen by Carolynn Serve NP on 05/20/14 Vital signs today: blood pressure 128/80, HR 78, and O2 sat 97% Patient does not feel like shortness of breath like has in past when holding on to fluid Discussed with Doreene Burke PA and will just have patient continue current medications and keep follow up appointments Advised patient of recommendation and to call back if breathing worse, verbalized understanding

## 2014-05-24 NOTE — Telephone Encounter (Signed)
Spoke with patient's wife who states patient is sleeping and she will give him message to call me back

## 2014-05-24 NOTE — Telephone Encounter (Signed)
I have reviewed not from [name], It appears that [name] is very stable.  Apt. In 6 months .

## 2014-05-24 NOTE — Telephone Encounter (Signed)
Follow Up ° ° ° ° ° ° ° °Pt returning phone call °

## 2014-05-24 NOTE — Telephone Encounter (Signed)
Spoke with patient who states he is returning our call.  I advised patient that I was calling him regarding a message from Alvina Filbert, LPN who spoke with the patient yesterday when he was in the office.  Patient states she was trying to schedule his follow-up appointment.  I advised that per office visit with [name], NP on Monday 4/25, patient does not need to return to see [name] for 6 months.  I advised patient to call back with questions or concerns, otherwise he will receive a letter for an appointment in 6 months.  Patient verbalized understanding and agreement.

## 2014-05-27 ENCOUNTER — Other Ambulatory Visit: Payer: Self-pay | Admitting: Cardiovascular Disease

## 2014-05-28 ENCOUNTER — Ambulatory Visit (INDEPENDENT_AMBULATORY_CARE_PROVIDER_SITE_OTHER): Payer: BLUE CROSS/BLUE SHIELD | Admitting: *Deleted

## 2014-05-28 DIAGNOSIS — I2699 Other pulmonary embolism without acute cor pulmonale: Secondary | ICD-10-CM | POA: Diagnosis not present

## 2014-05-28 DIAGNOSIS — I82401 Acute embolism and thrombosis of unspecified deep veins of right lower extremity: Secondary | ICD-10-CM

## 2014-05-28 DIAGNOSIS — Z5181 Encounter for therapeutic drug level monitoring: Secondary | ICD-10-CM

## 2014-05-28 LAB — POCT INR: INR: 4.8

## 2014-06-03 ENCOUNTER — Telehealth: Payer: Self-pay | Admitting: *Deleted

## 2014-06-03 NOTE — Telephone Encounter (Signed)
Patient wife reports that patient had blood coming out his ears on Saturday and he cut his hand and he bleed for a very long time.  She states that she did call his PCP and the patient refused to go the hospital for treatment.  The patient is not having any bleeding now. Advised that he really needs to see a physician for any bleeding issues.  She did not give the patient any coumadin on 06/02/14 due to the bleeding problems.

## 2014-06-04 ENCOUNTER — Ambulatory Visit (INDEPENDENT_AMBULATORY_CARE_PROVIDER_SITE_OTHER): Payer: BLUE CROSS/BLUE SHIELD | Admitting: *Deleted

## 2014-06-04 DIAGNOSIS — I2699 Other pulmonary embolism without acute cor pulmonale: Secondary | ICD-10-CM | POA: Diagnosis not present

## 2014-06-04 DIAGNOSIS — Z5181 Encounter for therapeutic drug level monitoring: Secondary | ICD-10-CM

## 2014-06-04 DIAGNOSIS — I82401 Acute embolism and thrombosis of unspecified deep veins of right lower extremity: Secondary | ICD-10-CM | POA: Diagnosis not present

## 2014-06-04 LAB — POCT INR: INR: 4.7

## 2014-06-04 MED ORDER — WARFARIN SODIUM 3 MG PO TABS: ORAL_TABLET | ORAL | Status: DC

## 2014-06-04 MED ORDER — WARFARIN SODIUM 3 MG PO TABS
ORAL_TABLET | ORAL | Status: DC
Start: 2014-06-04 — End: 2014-08-22

## 2014-06-05 ENCOUNTER — Ambulatory Visit (HOSPITAL_BASED_OUTPATIENT_CLINIC_OR_DEPARTMENT_OTHER): Payer: Medicare Other

## 2014-06-05 ENCOUNTER — Encounter: Payer: Self-pay | Admitting: Hematology and Oncology

## 2014-06-05 ENCOUNTER — Telehealth: Payer: Self-pay | Admitting: Hematology and Oncology

## 2014-06-05 ENCOUNTER — Ambulatory Visit (HOSPITAL_BASED_OUTPATIENT_CLINIC_OR_DEPARTMENT_OTHER): Payer: BLUE CROSS/BLUE SHIELD | Admitting: Hematology and Oncology

## 2014-06-05 ENCOUNTER — Ambulatory Visit: Payer: BLUE CROSS/BLUE SHIELD

## 2014-06-05 VITALS — BP 142/89 | HR 96 | Temp 98.6°F | Resp 18 | Ht 70.0 in | Wt 219.8 lb

## 2014-06-05 DIAGNOSIS — I82491 Acute embolism and thrombosis of other specified deep vein of right lower extremity: Secondary | ICD-10-CM | POA: Diagnosis not present

## 2014-06-05 DIAGNOSIS — I82431 Acute embolism and thrombosis of right popliteal vein: Secondary | ICD-10-CM

## 2014-06-05 DIAGNOSIS — I82441 Acute embolism and thrombosis of right tibial vein: Secondary | ICD-10-CM

## 2014-06-05 DIAGNOSIS — I2699 Other pulmonary embolism without acute cor pulmonale: Secondary | ICD-10-CM | POA: Diagnosis not present

## 2014-06-05 NOTE — Assessment & Plan Note (Signed)
Acute bilateral pulmonary emboli 05/11/2014 with evidence of right heart strain, thrombus extends into the left upper lobe and lingular segmental arteries, on the right thrombus in the right upper lobe pulmonary artery extending into segmental and subsegmental branches, right middle lobe branches and lower lobe branches.  Acute deep vein thrombosis involving the posterior tibial, peroneal, popliteal, femoral, and common femoral veins of the right lower extremity.   Current treatment: Coumadin  Counseling regarding high risk of blood clots: Patient has extensive bilateral DVTs and bilateral PEs with the extensive family history of blood clots. I recommended initiating hypercoagulability workup. We will send for factor V Leiden, prothrombin gene mutation, antithrombin III, homocysteine, antiphospholipid antibodies, factor VIII. We will not be doing protein C and S because the patient is on Coumadin therapy.  Treatment duration: If there were no other inherited or acquired hypercoagulable causes, my recommendation would be to anticoagulate him for 6 months to one year. Additional recommendations for long-term anticoagulation would depend on hypercoagulable workup.  Return to clinic in 2 weeks to discuss the results of blood tests that about done today.

## 2014-06-05 NOTE — Progress Notes (Signed)
[facility] CONSULT NOTE  Patient Care Team: Lona Kettle, MD as PCP - General (Family Medicine)  DIAGNOSIS: Bilateral pulmonary emboli with right heart strain, right lower extremity DVT Current treatment: Coumadin being managed by Coumadin clinic CHIEF COMPLIANT: Shortness of breath with exertion  INTERVAL HISTORY: [name] is a 74 year old with above-mentioned history of bilateral pulmonary emboli and right leg DVT currently on anticoagulation with Coumadin. He was admitted to the hospital in April with this symptom of shortness of breath to exertion. He had an outpatient evaluation which showed an elevated d-dimer that prompted admission to the hospital. CT scan done in the hospital revealed bilateral pulmonary emboli that were involving multiple segmental and subsegmental branches. He was started on anticoagulation with Coumadin. It is interesting to note that couple of years ago he had what appeared to be a left proximal veins show thrombus was prescribed anticoagulation with Xarelto but he developed profound bleeding that led to discontinuation of Xarelto. He continues to have shortness of breath to minimal exertion. Although his wife reports that he is slowly improving over the past 3 days. Patient is very anxious to get better so didn't go back to gardening in his everyday life. He has been bruising and bleeding from the skin because of supratherapeutic INR levels. His Coumadin dose is being adjusted by the Coumadin clinic. I reviewed her records extensively and collaborated the history with the patient.  MEDICAL HISTORY:  Past Medical History  Diagnosis Date  . Chronic diastolic CHF (congestive heart failure)     a. His initial ejection fraction was between 30 and 35%;  b. 04/2013 Echo: EF 50-55% Gr1 DD, mild-mod MR;  c. 04/2014 Echo: EF 60-65%, Gr 1 DD, triv TR.  Marland Kitchen Thyroid disease   . Pulmonary HTN   . Hypertension   . Addison's disease   . Hypogonadism male   .  CKD (chronic kidney disease), stage III   . History of aortic insufficiency   . Hx of cardiovascular stress test     a. ETT-Myoview 7/14:  Low risk, inferior defect consistent with thinning, no ischemia, normal wall motion, EF 54%  . DVT (deep venous thrombosis)     a. 02/2013 Bilat LE-->Xarelto later d/c'd 2/2 GIB;  b. 04/2014 Acute Right PT, Peroneal, Popliteal, Femoral, and common femoral DVT-->coumadoin  . GERD (gastroesophageal reflux disease)   . H/O hiatal hernia   . Hepatitis     a. 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  . Daily headache   . Chronic back pain   . Peptic ulcer disease   . On home oxygen therapy     a. added 04/2014 in setting of PE.  Marland Kitchen Acute lower GI bleeding     a. admitted to [facility] 04/11/2013;  b. 04/2014 EGD: 1.  gastric erosions, mild prox gastritis and bulbar duodenitis->Protonix.  . Pulmonary embolism     a. 04/2014 CTA: Bilat PE w/ right heart strain-->coumadin.    SURGICAL HISTORY: Past Surgical History  Procedure Laterality Date  . Cardiac catheterization  06/26/2008    EF 30-35%  . US echocardiography  04/09/2010    EF 60-65%  . Tonsillectomy  1940's  . Appendectomy  1950's  . Cholecystectomy  ~ 1965  . Esophagogastroduodenoscopy N/A 04/11/2013    Procedure: ESOPHAGOGASTRODUODENOSCOPY (EGD);  Surgeon: Missy Sabins, MD;  Location: [facility] ENDOSCOPY;  Service: Endoscopy;  Laterality: N/A;  . Esophagogastroduodenoscopy N/A 05/13/2014    Procedure: ESOPHAGOGASTRODUODENOSCOPY (EGD);  Surgeon: Arta Silence, MD;  Location:  [facility] ENDOSCOPY;  Service: Endoscopy;  Laterality: N/A;    SOCIAL HISTORY: History   Social History  . Marital Status: Married    Spouse Name: N/A  . Number of Children: N/A  . Years of Education: N/A   Occupational History  . retired    Social History Main Topics  . Smoking status: Never Smoker   . Smokeless tobacco: Never Used  . Alcohol Use: No  . Drug Use: No  . Sexual Activity: Not Currently   Other Topics Concern  . Not on file   Social History  Narrative    FAMILY HISTORY: Family History  Problem Relation Age of Onset  . Clotting disorder Mother   . Clotting disorder Brother   . Clotting disorder Brother   . Clotting disorder Brother   . Clotting disorder Other     Niece.    ALLERGIES:  has No Known Allergies.  MEDICATIONS:  Current Outpatient Prescriptions  Medication Sig Dispense Refill  . amLODipine (NORVASC) 5 MG tablet Take 5 mg by mouth daily.     . carvedilol (COREG) 25 MG tablet TAKE 1 TABLET TWICE A DAY WITH MEALS 180 tablet 2  . Cholecalciferol (VITAMIN D3 PO) Take 1,000 capsules by mouth daily.     Marland Kitchen enoxaparin (LOVENOX) 100 MG/ML injection Inject 1 mL (100 mg total) into the skin every 12 (twelve) hours. 0 Syringe   . furosemide (LASIX) 40 MG tablet Take 1 tablet (40 mg total) by mouth 2 (two) times daily. 180 tablet 1  . gabapentin (NEURONTIN) 600 MG tablet Take 600 mg by mouth daily.    . hydrocortisone (CORTEF) 20 MG tablet Take 20 mg by mouth 3 (three) times daily.     Marland Kitchen lisinopril (PRINIVIL,ZESTRIL) 10 MG tablet Take 1 tablet (10 mg total) by mouth daily. 90 tablet 2  . metoCLOPramide (REGLAN) 5 MG tablet Take 5 mg by mouth daily.     . pantoprazole (PROTONIX) 40 MG tablet Take 1 tablet (40 mg total) by mouth daily. 30 tablet 0  . potassium chloride (K-DUR,KLOR-CON) 10 MEQ tablet Take 1 tablet (10 mEq total) by mouth 2 (two) times daily. 180 tablet 1  . PROAIR HFA 108 (90 BASE) MCG/ACT inhaler INHALE 2 PUFFS INTO THE LUNGS EVERY 6 (SIX) HOURS AS NEEDED FOR WHEEZING OR SHORTNESS OF BREATH. 8.5 each 2  . promethazine (PHENERGAN) 25 MG tablet Take 25 mg by mouth every 6 (six) hours as needed.  0  . thyroid (ARMOUR) 60 MG tablet Take 60 mg by mouth daily.     . traMADol (ULTRAM) 50 MG tablet Take 50 mg by mouth 3 (three) times daily.     . traZODone (DESYREL) 100 MG tablet Take 50-100 mg by mouth every evening.   2  . warfarin (COUMADIN) 3 MG tablet Take as directed by coumadin clinic 30 tablet 0   No  current facility-administered medications for this visit.    REVIEW OF SYSTEMS:   Constitutional: Denies fevers, chills or abnormal night sweats Eyes: Denies blurriness of vision, double vision or watery eyes Ears, nose, mouth, throat, and face: Denies mucositis or sore throat Respiratory: Shortness of breath to exertion and wheezing Cardiovascular: Denies palpitation, chest discomfort or lower extremity swelling Gastrointestinal:  Denies nausea, heartburn or change in bowel habits Skin: Denies abnormal skin rashes Lymphatics: Denies new lymphadenopathy or easy bruising Neurological:Denies numbness, tingling or new weaknesses Behavioral/Psych: Mood is stable, no new changes   All other systems were reviewed with the patient and  are negative.  PHYSICAL EXAMINATION: ECOG PERFORMANCE STATUS: 1 - Symptomatic but completely ambulatory  Filed Vitals:   06/05/14 1228  BP: 142/89  Pulse: 96  Temp: 98.6 F (37 C)  Resp: 18   Filed Weights   06/05/14 1228  Weight: 219 lb 12.8 oz (99.701 kg)    GENERAL:alert, no distress and comfortable SKIN: skin color, texture, turgor are normal, no rashes or significant lesions EYES: normal, conjunctiva are pink and non-injected, sclera clear OROPHARYNX:no exudate, no erythema and lips, buccal mucosa, and tongue normal  NECK: supple, thyroid normal size, non-tender, without nodularity LYMPH:  no palpable lymphadenopathy in the cervical, axillary or inguinal LUNGS: clear to auscultation and percussion with normal breathing effort HEART: regular rate & rhythm and no murmurs and no lower extremity edema ABDOMEN:abdomen soft, non-tender and normal bowel sounds Musculoskeletal:no cyanosis of digits and no clubbing  PSYCH: alert & oriented x 3 with fluent speech NEURO: no focal motor/sensory deficits   LABORATORY DATA:  I have reviewed the data as listed Lab Results  Component Value Date   WBC 9.8 05/13/2014   HGB 10.9* 05/13/2014   HCT 34.0*  05/13/2014   MCV 91.6 05/13/2014   PLT 188 05/13/2014   Lab Results  Component Value Date   NA 143 05/13/2014   K 3.7 05/13/2014   CL 113* 05/13/2014   CO2 19 05/13/2014    RADIOGRAPHIC STUDIES: I have personally reviewed the radiology reports and agreed with their findings. CT chest with contrast showed multiple bilateral PEs Ultrasound lower extremities showed right lower extremity DVT  ASSESSMENT & PLAN:  Pulmonary embolism Acute bilateral pulmonary emboli 05/11/2014 with evidence of right heart strain, thrombus extends into the left upper lobe and lingular segmental arteries, on the right thrombus in the right upper lobe pulmonary artery extending into segmental and subsegmental branches, right middle lobe branches and lower lobe branches.  Acute deep vein thrombosis involving the posterior tibial, peroneal, popliteal, femoral, and common femoral veins of the right lower extremity.   Current treatment: Coumadin  Counseling regarding high risk of blood clots: Patient has extensive bilateral DVTs and bilateral PEs with the extensive family history of blood clots. I recommended initiating hypercoagulability workup. We will send for factor V Leiden, prothrombin gene mutation, antithrombin III, homocysteine, antiphospholipid antibodies, factor VIII. We will not be doing protein C and S because the patient is on Coumadin therapy.  Treatment duration: If there were no other inherited or acquired hypercoagulable causes, my recommendation would be to anticoagulate him for 6 months to one year. Additional recommendations for long-term anticoagulation would depend on hypercoagulable workup. If patient develops complications to anticoagulation then he will need a filter placement and vascular or cardiothoracic surgery consultation.  Return to clinic in 2 weeks to discuss the results of blood tests that about done today. If his symptoms do not get better then I would make a referral to  cardiothoracic surgery for their assistance in management of his bulky bilateral PE and DVT.    All questions were answered. The patient knows to call the clinic with any problems, questions or concerns.    Rulon Eisenmenger, MD 1:15 PM

## 2014-06-05 NOTE — Progress Notes (Signed)
Checked in new pt with no financial concerns prior to seeing the dr.  Pt has 2 insurances so financial assistance may not be needed.  ° °

## 2014-06-05 NOTE — Telephone Encounter (Signed)
Gave avs & calendar for May. Sent patient to lab

## 2014-06-06 ENCOUNTER — Encounter: Payer: Self-pay | Admitting: Emergency Medicine

## 2014-06-06 ENCOUNTER — Ambulatory Visit (INDEPENDENT_AMBULATORY_CARE_PROVIDER_SITE_OTHER): Payer: BLUE CROSS/BLUE SHIELD | Admitting: Emergency Medicine

## 2014-06-06 VITALS — BP 128/98 | HR 85 | Ht 69.0 in | Wt 222.0 lb

## 2014-06-06 DIAGNOSIS — I2699 Other pulmonary embolism without acute cor pulmonale: Secondary | ICD-10-CM

## 2014-06-06 NOTE — Assessment & Plan Note (Signed)
Bilateral pulmonary emboli in April 2016. His TTE showed normal right heart size and function. He is currently on Coumadin being managed at the [facility] cardiac risk reduction clinic. Guest with him the fact that since this is his second episode of DVT he will need to be treated for life. He has questions about his breathing and when he will start to feel less dyspneic. I've asked him to start to slowly increase his activity. I believe that he will slowly regain his functional  capacity as he is treated with anticoagulation as he begins  to exercise. We will follow-up in 6 months

## 2014-06-06 NOTE — Patient Instructions (Signed)
Please continue your Coumadin and your Coumadin monitoring at the [facility] You may begin to slowly increase you activity to rebuild you stamina.  Please follow up with Dr Acie Fredrickson as recommended to manage your diuretic medications.  Follow with Dr Lamonte Sakai in 6 months or sooner if you have any problems

## 2014-06-06 NOTE — Progress Notes (Signed)
Subjective:    Patient ID: [name], male    DOB: [birth year], 74 y.o.   MRN: [id number]  74 yo man, never smoker, hx HTN, diastolic dysfxn, Addison's dz, B LE DVT, GERD / PUD / GIB with anemia (Hgb 9.4-10.0). Xarelto was stopped due to the GIB (finished 5-6 weeks). Has been followed by [name] for NICM (? Better on last TTE 4/'15). Referred today for exertional SOB. No hx asthma or childhood PNA's.   He is dyspneic with exertion, progressive over several years. He has a very hoarse voice, occasionally loses his voice. He feels a weakness in his legs when he carries groceries. Sometimes hears wheezing, doesn't cough. Denies CP. He is feels GERD sx, on protonix. He is on an ACE-i.   ROV 06/27/13 -- follows for dyspnea. He has had V/q > low prob for chronic PE, Full PFT done 06/22/13 >> mild AFL, normal volumes, decrease DLCO that corrects for Va.  His breathing is about the same. No cough but still has a hoarse voice.   [facility] follow up 04/27/14 ---  Patient returns for a post hospital follow-up Patient was admitted April 15 through April 18 for acute pulmonary embolus and DVT. Patient presented with shortness of breath. CT chest showed acute bilateral pulmonary emboli. Echo showed no significant right heart strain. Venous Doppler showed a new right lower extremity DVT. Patient has a history of previous DVT 1 year ago. He did have a GI bleed while on Xarelto. This was felt secondary to associated nostril use with upper GI ulceration. Patient was seen by GI and underwent an upper endoscopy that showed gastric erosions mild proximal gastritis . He was recommended to use Protonix daily. Patient was treated with heparin with transition to Lovenox and Coumadin bridge. He was set up for oxygen with activity. However, has not been using this since discharge. Patient says that he is improved with decreased shortness of breath. However, continues to get winded intermittently, especially  with activity. He denies any signs of bleeding. He is currently on Lovenox and Coumadin. He denies any chest pain, palpitations, orthopnea, PND, extremity weakness, slurred speech, visual or speech changes, amoxicillin, hematuria, bloody stools. He does complain he has been having intermittent headaches, but denies any neurological changes. Patient has an appointment with his primary care physician today. We discussed that he will need lab work including his INR and Coumadin follow-ups at the Coumadin clinic at [facility]. Says he has a family history of blood clots, discussed referral to hematology.  ROV 06/06/14 -- follow-up visit for DVT and pulmonary embolism diagnosed in April 2016. He also has mild obstruction on pulmonary function testing, a history of diastolic and systolic CHF.  He has been supertherapeutic on coumadin, being adjusted. No current bleeding. Continues to have hoarse voice, no cough. He has been inactive lately. He is having dizziness and imbalance lately.   ROS:   As per the HPI   EXAM:  Filed Vitals:   06/06/14 0919  BP: 128/98  Pulse: 85  Height: 5\' 9"  (1.753 m)  Weight: 222 lb (100.699 kg)  SpO2: 95%    Gen: Pleasant, well-nourished, in no distress,  normal affect  ENT: No lesions,  mouth clear,  oropharynx clear, no postnasal drip, hoarseg  Neck: No JVD, no TMG, no carotid bruits  Lungs: No use of accessory muscles, clear without rales or rhonchi  Cardiovascular: RRR, heart sounds normal, no murmur or gallops, tr peripheral edema  Musculoskeletal: No deformities,  no cyanosis or clubbing  Neuro: alert, non focal  Skin: Warm, no lesions or rashes      Assessment & Plan:  Pulmonary embolism Bilateral pulmonary emboli in April 2016. His TTE showed normal right heart size and function. He is currently on Coumadin being managed at the [facility] cardiac risk reduction clinic. Guest with him the fact that since this is his second episode of DVT he will need to be  treated for life. He has questions about his breathing and when he will start to feel less dyspneic. I've asked him to start to slowly increase his activity. I believe that he will slowly regain his functional  capacity as he is treated with anticoagulation as he begins  to exercise. We will follow-up in 6 months

## 2014-06-09 LAB — HYPERCOAGULABLE PANEL, COMPREHENSIVE
ANTICARDIOLIPIN IGA: 3 U/mL (ref <22)
ANTICARDIOLIPIN IGG: 14 GPL U/mL (ref <23)
AntiThromb III Func: 119 % (ref 76–126)
Anticardiolipin IgM: 15 MPL U/mL — ABNORMAL HIGH (ref <11)
Beta-2 Glyco I IgG: 5 G Units (ref <20)
Beta-2-Glycoprotein I IgA: 22 A Units — ABNORMAL HIGH (ref <20)
Beta-2-Glycoprotein I IgM: 26 M Units — ABNORMAL HIGH (ref <20)
DRVVT 1:1 Mix: 40.2 secs (ref <42.9)
DRVVT: 84.9 secs — ABNORMAL HIGH (ref <42.9)
LUPUS ANTICOAGULANT: NOT DETECTED
PTT Lupus Anticoagulant: 49.4 secs — ABNORMAL HIGH (ref 28.0–43.0)
PTTLA 4:1 Mix: 39.9 secs (ref 28.0–43.0)
Protein C Activity: <15 % — ABNORMAL LOW (ref 75–133)
Protein C, Total: 71 % — ABNORMAL LOW (ref 72–160)
Protein S Activity: 33 % — ABNORMAL LOW (ref 69–129)
Protein S Total: 51 % — ABNORMAL LOW (ref 60–150)

## 2014-06-09 LAB — FACTOR 8 ASSAY: Coagulation Factor VIII: 469 % — ABNORMAL HIGH (ref 73–140)

## 2014-06-09 LAB — MTHFR DNA ANALYSIS

## 2014-06-09 LAB — HOMOCYSTEINE: Homocysteine: 13.7 umol/L (ref 4.0–15.4)

## 2014-06-11 ENCOUNTER — Ambulatory Visit (INDEPENDENT_AMBULATORY_CARE_PROVIDER_SITE_OTHER): Payer: BLUE CROSS/BLUE SHIELD | Admitting: *Deleted

## 2014-06-11 DIAGNOSIS — Z5181 Encounter for therapeutic drug level monitoring: Secondary | ICD-10-CM | POA: Diagnosis not present

## 2014-06-11 DIAGNOSIS — I82401 Acute embolism and thrombosis of unspecified deep veins of right lower extremity: Secondary | ICD-10-CM

## 2014-06-11 DIAGNOSIS — I2699 Other pulmonary embolism without acute cor pulmonale: Secondary | ICD-10-CM | POA: Diagnosis not present

## 2014-06-11 DIAGNOSIS — I82409 Acute embolism and thrombosis of unspecified deep veins of unspecified lower extremity: Secondary | ICD-10-CM

## 2014-06-11 LAB — POCT INR: INR: 5.5

## 2014-06-12 ENCOUNTER — Telehealth: Payer: Self-pay | Admitting: Cardiovascular Disease

## 2014-06-12 DIAGNOSIS — R600 Localized edema: Secondary | ICD-10-CM

## 2014-06-12 NOTE — Telephone Encounter (Signed)
Received call directly from operator from patient's wife, [name], who states she is very concerned about swelling in patient's lower extremities from the feet all the way up to the thighs and into the hands.  She states the patient feels terrible; c/o SOB which has not resolved since being hospitalized and told he has multiple blood clots in bilateral lungs.  Patient is followed by our Coumadin clinic and had pt/inr yesterday of 5.5 of which the patient and his wife are aware.  Patient would like to be seen soon; she reports BP today is 123/75 (1 hour after morning meds).  I advised wife that I will forward message to [name] who is working in the hospital today and will call her back with his advice.  She verbalized understanding and agreement.

## 2014-06-12 NOTE — Telephone Encounter (Signed)
New Message   Patient wife states that patient has massive swelling  Pt c/o swelling: STAT is pt has developed SOB within 24 hours  1. How long have you been experiencing swelling? Some time and Dr. Franco Nones about   2. Where is the swelling located? Feet ankles leg thighs and even in his hands   3.  Are you currently taking a "fluid pill" yes twice a day   4.  Are you currently SOB? Yes and "that's because of Blood Clots "  5.  Have you traveled recently?no

## 2014-06-12 NOTE — Telephone Encounter (Signed)
Spoke with patient's wife and reviewed Dr. Elmarie Shiley advice.  Wife would like to stop Amlodipine to see if this is contributing to the edema; I advised her that patient may d/c.  I advised her that patient will take Lasix 80 mg BID and Klor-Con 20 meq BID today, tomorrow, and Friday and then will resume regular dosage of Lasix 40 mg BID and Klor-Con 10 meq BID on Saturday.  I advised her on appropriate leg elevation above the level of the heart and need for lab appointment for bmet which is scheduled for 5/24 on same day of Coumadin clinic appointment.  I advised her to call me back on Monday to evaluate how patient is doing.  Patient's wife verbalized understanding and agreement with plan of care.  I advised her to call before Monday with questions or concerns.

## 2014-06-12 NOTE — Telephone Encounter (Signed)
Pt has multiple reasons to have leg edema Patient has documented DVT, pulmonary emboli, and diastolic dysfunction and is on amlodipine. All of these will contribute to leg edema. The DVT is in the right leg.  Is the edema worse in the right leg or the same .  I recommend leg elevation Double his lasix for 3 days.  Also double potassium x 3 days ( or add potassium 10 bid if he is not on any)  Can recheck BMP in next week.

## 2014-06-12 NOTE — Telephone Encounter (Signed)
Agree with note by Michelle Swinyer, RN  

## 2014-06-17 ENCOUNTER — Ambulatory Visit: Payer: BLUE CROSS/BLUE SHIELD | Admitting: Emergency Medicine

## 2014-06-18 ENCOUNTER — Other Ambulatory Visit (INDEPENDENT_AMBULATORY_CARE_PROVIDER_SITE_OTHER): Payer: BLUE CROSS/BLUE SHIELD | Admitting: *Deleted

## 2014-06-18 ENCOUNTER — Telehealth: Payer: Self-pay | Admitting: Cardiovascular Disease

## 2014-06-18 ENCOUNTER — Encounter: Payer: Self-pay | Admitting: Nurse Practitioner

## 2014-06-18 ENCOUNTER — Ambulatory Visit (INDEPENDENT_AMBULATORY_CARE_PROVIDER_SITE_OTHER): Payer: BLUE CROSS/BLUE SHIELD | Admitting: *Deleted

## 2014-06-18 DIAGNOSIS — R600 Localized edema: Secondary | ICD-10-CM

## 2014-06-18 DIAGNOSIS — I82401 Acute embolism and thrombosis of unspecified deep veins of right lower extremity: Secondary | ICD-10-CM | POA: Diagnosis not present

## 2014-06-18 DIAGNOSIS — I2699 Other pulmonary embolism without acute cor pulmonale: Secondary | ICD-10-CM | POA: Diagnosis not present

## 2014-06-18 DIAGNOSIS — Z5181 Encounter for therapeutic drug level monitoring: Secondary | ICD-10-CM

## 2014-06-18 LAB — BASIC METABOLIC PANEL
BUN: 24 mg/dL — AB (ref 6–23)
CALCIUM: 9 mg/dL (ref 8.4–10.5)
CO2: 31 mEq/L (ref 19–32)
Chloride: 104 mEq/L (ref 96–112)
Creatinine, Ser: 1.58 mg/dL — ABNORMAL HIGH (ref 0.40–1.50)
GFR: 45.87 mL/min — AB (ref >60.00)
Glucose, Bld: 104 mg/dL — ABNORMAL HIGH (ref 70–99)
Potassium: 3.3 mEq/L — ABNORMAL LOW (ref 3.5–5.1)
Sodium: 142 mEq/L (ref 135–145)

## 2014-06-18 LAB — POCT INR: INR: 1.8

## 2014-06-18 NOTE — Telephone Encounter (Signed)
New problem   Want to speak back to you concerning her husbands blood clots. Please call pt's wife.

## 2014-06-18 NOTE — Telephone Encounter (Signed)
Spoke with patient's wife who called to ask about lab appointment scheduled today.  I advised her that patient has lab appointment today for bmet which can be obtained prior to coumadin clinic appointment at 4:30.  She states patient's swelling began to decrease yesterday and they are very pleased.  I advised her to call back with questions or concerns and she verbalized understanding and agreement.

## 2014-06-19 ENCOUNTER — Other Ambulatory Visit: Payer: Self-pay | Admitting: Hematology and Oncology

## 2014-06-19 ENCOUNTER — Ambulatory Visit (HOSPITAL_BASED_OUTPATIENT_CLINIC_OR_DEPARTMENT_OTHER): Payer: BLUE CROSS/BLUE SHIELD | Admitting: Hematology and Oncology

## 2014-06-19 ENCOUNTER — Telehealth: Payer: Self-pay | Admitting: Cardiovascular Disease

## 2014-06-19 ENCOUNTER — Other Ambulatory Visit: Payer: Self-pay | Admitting: *Deleted

## 2014-06-19 ENCOUNTER — Telehealth: Payer: Self-pay | Admitting: Hematology and Oncology

## 2014-06-19 VITALS — BP 161/87 | HR 95 | Temp 98.4°F | Resp 18 | Ht 69.0 in | Wt 224.1 lb

## 2014-06-19 DIAGNOSIS — I82441 Acute embolism and thrombosis of right tibial vein: Secondary | ICD-10-CM

## 2014-06-19 DIAGNOSIS — I82431 Acute embolism and thrombosis of right popliteal vein: Secondary | ICD-10-CM | POA: Diagnosis not present

## 2014-06-19 DIAGNOSIS — I82491 Acute embolism and thrombosis of other specified deep vein of right lower extremity: Secondary | ICD-10-CM | POA: Diagnosis not present

## 2014-06-19 DIAGNOSIS — I2699 Other pulmonary embolism without acute cor pulmonale: Secondary | ICD-10-CM

## 2014-06-19 DIAGNOSIS — R0602 Shortness of breath: Secondary | ICD-10-CM

## 2014-06-19 NOTE — Telephone Encounter (Signed)
Spoke with patient and his wife who states leg swelling is worse today, particularly in the right leg.  I spoke with them yesterday and they reported the swelling had improved.  Patient states he has had his legs elevated above the level of his heart as advised and states SOB is the same as it has been since being diagnosed with blood clots in lungs months ago.  Patient's wife states bilateral feet have good color but are very tight; denies weeping of fluid.  I advised her that I will discuss treatment options with [name].  The patient had BMET yesterday which indicate K+ level of 3.3, Creatinine 1.58 and BUN 24.  He currently takes Lasix 40 mg BID.  He recently d/c'ed Amlodipine to see if it would help decrease leg swelling.  On 5/18-5/20 he doubled Lasix to 80 mg BID and wife states his leg swelling did improve but he did not urinate as much as he thought he should have.  I am forwarding note to [name] for advice and patient is aware I will call her back later.  She verbalized understanding and agreement.

## 2014-06-19 NOTE — Progress Notes (Signed)
Patient Care Team: Lona Kettle, MD as PCP - General (Family Medicine)  DIAGNOSIS: Bilateral PE with heart strain; DVT legs Current treatment: Coumadin   INTERVAL HISTORY: [name] is a 74 year old with above-mentioned history of bilateral pulmonary emboli who underwent hypercoagulability workup and is here today to discuss the result. He continues to be severely short of breath. His breathing has not improved at all in spite of being on anticoagulation. In fact he reports that he feels much worse. He is continuing to gain water weight with lower extremity edema. His prescribed diabetics by his primary care physician.  REVIEW OF SYSTEMS:   Constitutional: Denies fevers, chills or abnormal weight loss Eyes: Denies blurriness of vision Ears, nose, mouth, throat, and face: Denies mucositis or sore throat Respiratory: Shortness of breath at rest Cardiovascular: Occasional palpitations Gastrointestinal:  Denies nausea, heartburn or change in bowel habits Skin: Denies abnormal skin rashes Lymphatics: Denies new lymphadenopathy or easy bruising Neurological:Denies numbness, tingling or new weaknesses Behavioral/Psych: Mood is stable, no new changes   All other systems were reviewed with the patient and are negative.  I have reviewed the past medical history, past surgical history, social history and family history with the patient and they are unchanged from previous note.  ALLERGIES:  has No Known Allergies.  MEDICATIONS:  Current Outpatient Prescriptions  Medication Sig Dispense Refill  . carvedilol (COREG) 25 MG tablet TAKE 1 TABLET TWICE A DAY WITH MEALS 180 tablet 2  . Cholecalciferol (VITAMIN D3 PO) Take 1,000 capsules by mouth daily.     . furosemide (LASIX) 40 MG tablet Take 1 tablet (40 mg total) by mouth 2 (two) times daily. 180 tablet 1  . gabapentin (NEURONTIN) 600 MG tablet Take 600 mg by mouth daily.    . hydrocortisone (CORTEF) 20 MG tablet Take 20 mg by mouth 3  (three) times daily.     Marland Kitchen lisinopril (PRINIVIL,ZESTRIL) 10 MG tablet Take 1 tablet (10 mg total) by mouth daily. 90 tablet 2  . metoCLOPramide (REGLAN) 5 MG tablet Take 5 mg by mouth daily.     . pantoprazole (PROTONIX) 40 MG tablet Take 1 tablet (40 mg total) by mouth daily. 30 tablet 0  . potassium chloride (K-DUR,KLOR-CON) 10 MEQ tablet Take 1 tablet (10 mEq total) by mouth 2 (two) times daily. 180 tablet 1  . PROAIR HFA 108 (90 BASE) MCG/ACT inhaler INHALE 2 PUFFS INTO THE LUNGS EVERY 6 (SIX) HOURS AS NEEDED FOR WHEEZING OR SHORTNESS OF BREATH. 8.5 each 2  . promethazine (PHENERGAN) 25 MG tablet Take 25 mg by mouth every 6 (six) hours as needed.  0  . thyroid (ARMOUR) 60 MG tablet Take 60 mg by mouth daily.     . traMADol (ULTRAM) 50 MG tablet Take 50 mg by mouth 3 (three) times daily.     . traZODone (DESYREL) 100 MG tablet Take 50-100 mg by mouth every evening.   2  . warfarin (COUMADIN) 3 MG tablet Take as directed by coumadin clinic 30 tablet 0   No current facility-administered medications for this visit.    PHYSICAL EXAMINATION: ECOG PERFORMANCE STATUS: 1 - Symptomatic but completely ambulatory  Filed Vitals:   06/19/14 1408  BP: 161/87  Pulse: 95  Temp: 98.4 F (36.9 C)  Resp: 18   Filed Weights   06/19/14 1408  Weight: 224 lb 1 oz (101.634 kg)    GENERAL:alert, no distress and comfortable SKIN: skin color, texture, turgor are normal, no rashes or significant lesions EYES:  normal, Conjunctiva are pink and non-injected, sclera clear OROPHARYNX:no exudate, no erythema and lips, buccal mucosa, and tongue normal  NECK: supple, thyroid normal size, non-tender, without nodularity LYMPH:  no palpable lymphadenopathy in the cervical, axillary or inguinal LUNGS: Diminished breath sounds at the bases with fine crackles  HEART: regular rate & rhythm and no murmurs ABDOMEN:abdomen soft, non-tender and normal bowel sounds Musculoskeletal:no cyanosis of digits and no clubbing   NEURO: alert & oriented x 3 with fluent speech, no focal motor/sensory deficits Extremities: 3-4+ pitting edema LABORATORY DATA:  I have reviewed the data as listed   Chemistry      Component Value Date/Time   NA 142 06/18/2014 1553   K 3.3* 06/18/2014 1553   CL 104 06/18/2014 1553   CO2 31 06/18/2014 1553   BUN 24* 06/18/2014 1553   CREATININE 1.58* 06/18/2014 1553      Component Value Date/Time   CALCIUM 9.0 06/18/2014 1553   ALKPHOS 47 05/13/2014 0250   AST 11 05/13/2014 0250   ALT 14 05/13/2014 0250   BILITOT 0.7 05/13/2014 0250       Lab Results  Component Value Date   WBC 9.8 05/13/2014   HGB 10.9* 05/13/2014   HCT 34.0* 05/13/2014   MCV 91.6 05/13/2014   PLT 188 05/13/2014   NEUTROABS 6.7 05/13/2014     ASSESSMENT & PLAN:  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 Acute deep vein thrombosis involving the posterior tibial, peroneal, popliteal, femoral, and common femoral veins of the right lower extremity.   Current treatment: Coumadin  Hypercoagulability workup. 1. factor V Leiden: Normal 2. prothrombin gene mutation: Not detected  3. antithrombin III: Normal  4. Homocysteine: Normal 13.7 5. antiphospholipid antibodies: Functional assay was normal but beaded 2 glycoprotein 1 IgG (26) and IgA (22) were elevated, anticardiolipin IgM elevated at 15 6. 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 MTHFR gene mutation: Not detected   Severe shortness of breath: I am very concerned about his breathing issues which have not improved in spite of being on anticoagulation. I will refer him to cardiothoracic surgery to evaluate if he needs  TPA. Profound leg edema: Could be related to his DVT. I would like to refer him to vascular surgery for further evaluation of his legs as well.  I instructed the patient that if he develops profound shortness of breath that he should go to the emergency room and got admitted. While hes in the hospital, he might get a thorough evaluation from the respective specialties.  Treatment duration: We will repeat anti-phospholipid antibody panel in 3 months. If it is persistently positive then he will need to be on anticoagulation indefinitely.   Return to clinic in 3 months after repeat blood testing and follow-up.  No orders of the defined types were placed in this encounter.   The patient has a good understanding of the overall plan. he agrees with it. he will call with any problems that may develop before the next visit here.   Rulon Eisenmenger, MD

## 2014-06-19 NOTE — Assessment & Plan Note (Signed)
Acute bilateral pulmonary emboli 05/11/2014 with evidence of right heart strain, thrombus extends into the left upper lobe and lingular segmental arteries, on the right thrombus in the right upper lobe pulmonary artery extending into segmental and subsegmental branches, right middle lobe branches and lower lobe branches. Acute deep vein thrombosis involving the posterior tibial, peroneal, popliteal, femoral, and common femoral veins of the right lower extremity.   Current treatment: Coumadin  Hypercoagulability workup. 1. factor V Leiden: Normal 2. prothrombin gene mutation,  3. antithrombin III,  4. Homocysteine: Normal 13.7 5. antiphospholipid antibodies: Functional assay was normal but beaded 2 glycoprotein 1 IgG (26) and IgA (22) were elevated, anticardiolipin IgM elevated at 15 6. 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 MTHFR gene mutation: Not detected   Treatment duration: We will repeat anti-phospholipid antibody panel in 3 months. If it is persistently positive then he will need to be on anticoagulation indefinitely.   Return to clinic in 3 months after repeat blood testing and follow-up.

## 2014-06-19 NOTE — Telephone Encounter (Signed)
Reviewed Dr. Elmarie Shiley advice with patient who verbalized understanding and agreement.  Patient aware of lab results and to continue current treatment.

## 2014-06-19 NOTE — Telephone Encounter (Signed)
New Message   Patient wife is calling  Pt c/o swelling: STAT is pt has developed SOB within 24 hours  1. How long have you been experiencing swelling? A month  2. Where is the swelling located? Ankles and feet   3.  Are you currently taking a "fluid pill"?yes   4.  Are you currently SOB?he has been b/c he has blood clots in lungs   5.  Have you traveled recently?no

## 2014-06-19 NOTE — Telephone Encounter (Signed)
Appointments made and a calendar was  mailed to the patient,the referral to [facility] -they

## 2014-06-19 NOTE — Telephone Encounter (Signed)
He has extensive DVT in his right leg which is causing the swelling . He needs bed rest with leg elevation.  We can give him a script for compression hose if needed.  Continue anticoagulation This is not really a cardiac issues but more of a DVT issue.   Increasing lasix is not likely to help much so I would suggest continuing the same dose.

## 2014-06-21 ENCOUNTER — Encounter (HOSPITAL_COMMUNITY): Payer: Self-pay

## 2014-06-21 ENCOUNTER — Ambulatory Visit (HOSPITAL_COMMUNITY)
Admission: RE | Admit: 2014-06-21 | Discharge: 2014-06-21 | Disposition: A | Discharge status: Home / Self Care | Payer: BLUE CROSS/BLUE SHIELD | Source: Ambulatory Visit | Attending: Hematology and Oncology | Admitting: Hematology and Oncology

## 2014-06-21 DIAGNOSIS — I2699 Other pulmonary embolism without acute cor pulmonale: Secondary | ICD-10-CM

## 2014-06-21 DIAGNOSIS — R918 Other nonspecific abnormal finding of lung field: Secondary | ICD-10-CM | POA: Insufficient documentation

## 2014-06-21 DIAGNOSIS — I2510 Atherosclerotic heart disease of native coronary artery without angina pectoris: Secondary | ICD-10-CM | POA: Insufficient documentation

## 2014-06-21 MED ORDER — IOHEXOL 350 MG/ML SOLN
100.0000 mL | Freq: Once | INTRAVENOUS | Status: AC | PRN
Start: 2014-06-21 — End: 2014-06-21
  Administered 2014-06-21: 100 mL via INTRAVENOUS

## 2014-06-25 ENCOUNTER — Telehealth: Payer: Self-pay | Admitting: *Deleted

## 2014-06-25 NOTE — Telephone Encounter (Signed)
VM message from pt's wife this am @8 :13 am. She states her husband is feeling very short of breath. Has h/o of significant PE. Pt had scan on 06/21/14 which showed no acute PE at that time. Call back to pt's home. No answer. Left VM to call back.

## 2014-06-25 NOTE — Telephone Encounter (Signed)
Pt reports he has spoken with Dr. Lindi Adie.

## 2014-06-25 NOTE — Telephone Encounter (Signed)
Call report dtd 06/21/14 rcvd from [facility].  Sent to scan.

## 2014-06-26 ENCOUNTER — Telehealth: Payer: Self-pay | Admitting: *Deleted

## 2014-06-26 NOTE — Telephone Encounter (Signed)
Patients spouse called with questions about blood clots. "Dr. Lindi Adie spoke with [place] about test to make sure there are no blood clots.  We know there are no clots in legs but did we miss a test for the lungs?   Will notify provider for any further orders or instructions.  Return number is home [phone].  Observed appointment for coumadin clinic with cardiologist.  Discussed these lab values are an indicator.

## 2014-06-26 NOTE — Telephone Encounter (Signed)
Dr. Lindi Adie called and spoke to patient's wife.

## 2014-06-28 ENCOUNTER — Ambulatory Visit (INDEPENDENT_AMBULATORY_CARE_PROVIDER_SITE_OTHER): Payer: Medicare Other | Admitting: Cardiovascular Disease

## 2014-06-28 ENCOUNTER — Ambulatory Visit (INDEPENDENT_AMBULATORY_CARE_PROVIDER_SITE_OTHER): Payer: Medicare Other | Admitting: *Deleted

## 2014-06-28 ENCOUNTER — Encounter: Payer: Self-pay | Admitting: Cardiovascular Disease

## 2014-06-28 VITALS — BP 176/104 | HR 81 | Ht 69.0 in | Wt 227.8 lb

## 2014-06-28 DIAGNOSIS — I82401 Acute embolism and thrombosis of unspecified deep veins of right lower extremity: Secondary | ICD-10-CM | POA: Diagnosis not present

## 2014-06-28 DIAGNOSIS — I82409 Acute embolism and thrombosis of unspecified deep veins of unspecified lower extremity: Secondary | ICD-10-CM | POA: Diagnosis not present

## 2014-06-28 DIAGNOSIS — I2699 Other pulmonary embolism without acute cor pulmonale: Secondary | ICD-10-CM | POA: Diagnosis not present

## 2014-06-28 DIAGNOSIS — Z5181 Encounter for therapeutic drug level monitoring: Secondary | ICD-10-CM

## 2014-06-28 DIAGNOSIS — R600 Localized edema: Secondary | ICD-10-CM

## 2014-06-28 LAB — POCT INR: INR: 2.5

## 2014-06-28 MED ORDER — SPIRONOLACTONE 25 MG PO TABS: 25.0000 mg | ORAL_TABLET | Freq: Every day | ORAL | Status: DC

## 2014-06-28 NOTE — Patient Instructions (Addendum)
Medication Instructions:  START Aldactone (Spironolactone) 25 mg once daily  Labwork: Your physician recommends that you return for lab work in: 1 week for basic metabolic panel   Testing/Procedures: None Ordered  Follow-Up: Your physician recommends that you schedule a follow-up appointment in: 2 months with [name]     For your  leg edema you  should do  the following 1. Leg elevation - I recommend the Lounge Dr. Leg rest.  See below for details  2. Salt restriction  -  Use potassium chloride instead of regular salt as a salt substitute. 3. Walk regularly 4. Compression hose - [facility] 5. Weight loss     Go to [email or web address]

## 2014-06-28 NOTE — Progress Notes (Signed)
 Patient Name: [name] Date of Encounter: 06/28/2014  Primary Care Provider:   Ross, Alan, MD Primary Cardiologist:  P. , MD   Chief Complaint  74-year-old male who presents for hospital follow-up related to recent admission for bilateral PE and right lower extremity DVT.  Past Medical History   Past Medical History  Diagnosis Date  . Chronic diastolic CHF (congestive heart failure)     a. His initial ejection fraction was between 30 and 35%;  b. 04/2013 Echo: EF 50-55% Gr1 DD, mild-mod MR;  c. 04/2014 Echo: EF 60-65%, Gr 1 DD, triv TR.  . Thyroid disease   . Pulmonary HTN   . Hypertension   . Addison's disease   . Hypogonadism male   . CKD (chronic kidney disease), stage III   . History of aortic insufficiency   . Hx of cardiovascular stress test     a. ETT-Myoview 7/14:  Low risk, inferior defect consistent with thinning, no ischemia, normal wall motion, EF 54%  . DVT (deep venous thrombosis)     a. 02/2013 Bilat LE-->Xarelto later d/c'd 2/2 GIB;  b. 04/2014 Acute Right PT, Peroneal, Popliteal, Femoral, and common femoral DVT-->coumadoin  . GERD (gastroesophageal reflux disease)   . H/O hiatal hernia   . Hepatitis     a. 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  . Daily headache   . Chronic back pain   . Peptic ulcer disease   . On home oxygen therapy     a. added 04/2014 in setting of PE.  . Acute lower GI bleeding     a. admitted to [facility] 04/11/2013;  b. 04/2014 EGD: 1.  gastric erosions, mild prox gastritis and bulbar duodenitis->Protonix.  . Pulmonary embolism     a. 04/2014 CTA: Bilat PE w/ right heart strain-->coumadin.   Past Surgical History  Procedure Laterality Date  . Cardiac catheterization  06/26/2008    EF 30-35%  . Us echocardiography  04/09/2010    EF 60-65%  . Tonsillectomy  1940's  . Appendectomy  1950's  . Cholecystectomy  ~ 1965  . Esophagogastroduodenoscopy N/A 04/11/2013    Procedure: ESOPHAGOGASTRODUODENOSCOPY (EGD);  Surgeon: John C Hayes, MD;  Location: [facility]  ENDOSCOPY;  Service: Endoscopy;  Laterality: N/A;  . Esophagogastroduodenoscopy N/A 05/13/2014    Procedure: ESOPHAGOGASTRODUODENOSCOPY (EGD);  Surgeon: William Outlaw, MD;  Location: [facility] ENDOSCOPY;  Service: Endoscopy;  Laterality: N/A;    Allergies  No Known Allergies  HPI He was seen by Chris Berge, NP in April, 2016 - note is as below  74-year-old male with the above complex problem list. I last saw him in clinic about 2 weeks ago, at which time he reported a 4 week history of dyspnea and lower extremity edema, which was initially treated as an upper respiratory infection. He did have evidence of volume overload on exam and we increased his Lasix. He was seen 3 days later secondary to ongoing dyspnea despite diuresis. D-dimer was drawn and was elevated at 4. He was referred to the hospital for CT angiography. This was performed early on April 16 and revealed bilateral submassive pulmonary emboli. He was admitted to medicine and placed on heparin. Radical care in interventional radiology were consulted and did not feel that he met criteria for TPA. He was hemodynamically stable throughout his hospitalization and converted to Coumadin. Prior to this, he was seen by GI given prior history of GI bleeding and an EGD was performed on April 18 and showed gastric erosions without evidence   of active bleeding. PPI therapy was prescribed.  Since his discharge, he has continued to have dyspnea with minimal exertion. He has been on Lovenox bridging and finish his last Lovenox injection yesterday morning. He has not yet had her INR checked. He is not having chest pain, palpitations, PND, orthopnea, dizziness, syncope, edema, or early satiety. Previous symptoms of volume overload including increasing abdominal girth and edema have more or less resolved.   June 28, 2014:     Home Medications  Current Outpatient Prescriptions  Medication Sig Dispense Refill  . carvedilol (COREG) 25 MG tablet TAKE 1 TABLET  TWICE A DAY WITH MEALS 180 tablet 2  . Cholecalciferol (VITAMIN D3 PO) Take 1,000 capsules by mouth daily.     . furosemide (LASIX) 40 MG tablet Take 1 tablet (40 mg total) by mouth 2 (two) times daily. 180 tablet 1  . gabapentin (NEURONTIN) 600 MG tablet Take 600 mg by mouth daily.    . hydrocortisone (CORTEF) 20 MG tablet Take 20 mg by mouth 3 (three) times daily.     Marland Kitchen lisinopril (PRINIVIL,ZESTRIL) 10 MG tablet Take 1 tablet (10 mg total) by mouth daily. 90 tablet 2  . metoCLOPramide (REGLAN) 5 MG tablet Take 5 mg by mouth daily.     . pantoprazole (PROTONIX) 40 MG tablet Take 1 tablet (40 mg total) by mouth daily. 30 tablet 0  . potassium chloride (K-DUR,KLOR-CON) 10 MEQ tablet Take 1 tablet (10 mEq total) by mouth 2 (two) times daily. 180 tablet 1  . PROAIR HFA 108 (90 BASE) MCG/ACT inhaler INHALE 2 PUFFS INTO THE LUNGS EVERY 6 (SIX) HOURS AS NEEDED FOR WHEEZING OR SHORTNESS OF BREATH. 8.5 each 2  . promethazine (PHENERGAN) 25 MG tablet Take 25 mg by mouth every 6 (six) hours as needed.  0  . thyroid (ARMOUR) 60 MG tablet Take 60 mg by mouth daily.     . traMADol (ULTRAM) 50 MG tablet Take 50 mg by mouth 3 (three) times daily.     . traZODone (DESYREL) 100 MG tablet Take 50-100 mg by mouth every evening.   2  . warfarin (COUMADIN) 3 MG tablet Take as directed by coumadin clinic 30 tablet 0   No current facility-administered medications for this visit.                                                                                                                       Review of Systems  As above, he continues to experience DOE.  He denies chest pain, palpitations, pnd, orthopnea, n, v, dizziness, syncope, edema, weight gain, or early satiety. All other systems reviewed and are otherwise negative except as noted above.  Physical Exam  VS:  BP 176/104 mmHg  Pulse 81  Ht 5' 9" (1.753 m)  Wt 103.329 kg (227 lb 12.8 oz)  BMI 33.62 kg/m2  SpO2 98% , BMI Body mass index  is 33.62 kg/(m^2). GEN: Well nourished, well developed, in no acute distress. HEENT: normal. Neck:  Supple, no JVD, carotid bruits, or masses. Cardiac: RRR, no murmurs, rubs, or gallops. No clubbing, cyanosis.  Trace bilat LE edema.  Radials/DP/PT 2+ and equal bilaterally.  Respiratory:  Respirations regular and unlabored, clear to auscultation bilaterally. GI: Soft, nontender, nondistended, BS + x 4. MS: no deformity or atrophy. Skin: warm and dry, no rash. Neuro:  Strength and sensation are intact. Psych: Normal affect.  Accessory Clinical Findings  INR pending today  Assessment & Plan  1.  Bilateral pulmonary emboli/right lower extremity DVT:    On warfarin,    2. Chronic diastolic congestive heart failure: Volume appears stable today. Heart rate and blood pressure stable. Continue Lasix at current dose. Will add spironolactone 25 mg a day. We'll check a basic medical profile in the week.  3. Hypertension: Stable on current regimen including blocker, ACE inhibitor, and calcium channel blocker therapy.  We'll add spironolactone 25 mg a day.  4. 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 He had a elevated left ventricular end-diastolic pressure. If he feels better with the addition of Aldactone and I do not think that we need to do anything further but if he has continued trust breath.suggest that we repeat a right and left heart catheterization. I'll see him again in 2 months for follow-up assessment.    ,  J, MD  06/28/2014 9:19 AM    [facility] [address],  [address] [place], [place]  [address] Pager [phone] Phone: [phone]; Fax: [phone]   [place] [facility]  [address] [place], [place]  [address] [phone]    Fax [phone]   

## 2014-07-01 ENCOUNTER — Other Ambulatory Visit: Payer: Self-pay | Admitting: *Deleted

## 2014-07-01 MED ORDER — CARVEDILOL 25 MG PO TABS: 25.0000 mg | ORAL_TABLET | Freq: Two times a day (BID) | ORAL | Status: DC

## 2014-07-05 ENCOUNTER — Other Ambulatory Visit: Payer: Medicare Other

## 2014-07-08 ENCOUNTER — Telehealth: Payer: Self-pay | Admitting: Cardiovascular Disease

## 2014-07-08 ENCOUNTER — Other Ambulatory Visit (INDEPENDENT_AMBULATORY_CARE_PROVIDER_SITE_OTHER): Payer: BLUE CROSS/BLUE SHIELD | Admitting: *Deleted

## 2014-07-08 ENCOUNTER — Ambulatory Visit (INDEPENDENT_AMBULATORY_CARE_PROVIDER_SITE_OTHER): Payer: BLUE CROSS/BLUE SHIELD | Admitting: *Deleted

## 2014-07-08 DIAGNOSIS — I82401 Acute embolism and thrombosis of unspecified deep veins of right lower extremity: Secondary | ICD-10-CM | POA: Diagnosis not present

## 2014-07-08 DIAGNOSIS — Z5181 Encounter for therapeutic drug level monitoring: Secondary | ICD-10-CM

## 2014-07-08 DIAGNOSIS — I82409 Acute embolism and thrombosis of unspecified deep veins of unspecified lower extremity: Secondary | ICD-10-CM | POA: Diagnosis not present

## 2014-07-08 DIAGNOSIS — I2699 Other pulmonary embolism without acute cor pulmonale: Secondary | ICD-10-CM | POA: Diagnosis not present

## 2014-07-08 DIAGNOSIS — R600 Localized edema: Secondary | ICD-10-CM

## 2014-07-08 LAB — BASIC METABOLIC PANEL
BUN: 28 mg/dL — ABNORMAL HIGH (ref 6–23)
CO2: 25 mEq/L (ref 19–32)
Calcium: 9.8 mg/dL (ref 8.4–10.5)
Chloride: 105 mEq/L (ref 96–112)
Creatinine, Ser: 2.09 mg/dL — ABNORMAL HIGH (ref 0.40–1.50)
GFR: 33.21 mL/min — ABNORMAL LOW (ref >60.00)
GLUCOSE: 99 mg/dL (ref 70–99)
POTASSIUM: 3.9 meq/L (ref 3.5–5.1)
SODIUM: 137 meq/L (ref 135–145)

## 2014-07-08 LAB — POCT INR: INR: 2.2

## 2014-07-08 NOTE — Telephone Encounter (Addendum)
Patient's wife st that patient has had BLE cramping for a couple days.  Yesterday, he had a "terrible episode" lasting the whole morning and a majority of the day. He had diarrhea, nausea, the sweats, chills, and was pale.  [name] wife st his legs were "scary skinny" as well since starting the aldactone at last OV on 6/3. During the episode, BP= 88/59. After the episode, he held his afternoon diuretics and his BP increased to 120/82.  This morning, he held his diuretics as well. His BP is 144/80. He feels "a little better but is still experiencing dizziness." He has a BMET scheduled for today.  Per Truitt Merle, patient is to hold his diuretics and call Wednesday to follow-up with [name].  Patient's wife agrees with treatment plan.

## 2014-07-08 NOTE — Telephone Encounter (Signed)
New Message  Pt wife calling about pt's BP issue: 88/59 recorded yesterday 6/12, better today:116/87 per pt wife. Pt is coming in to office for blood work wants to spweak w/ Rn about BP issue. Please call back and discuss.

## 2014-07-08 NOTE — Addendum Note (Signed)
Addended by: Eulis Foster on: 07/08/2014 08:53 AM   Modules accepted: Orders

## 2014-07-08 NOTE — Telephone Encounter (Signed)
Left message to call back  

## 2014-07-10 ENCOUNTER — Telehealth: Payer: Self-pay | Admitting: Cardiovascular Disease

## 2014-07-10 DIAGNOSIS — I5032 Chronic diastolic (congestive) heart failure: Secondary | ICD-10-CM

## 2014-07-10 MED ORDER — FUROSEMIDE 40 MG PO TABS: 20.0000 mg | ORAL_TABLET | Freq: Every day | ORAL | Status: DC

## 2014-07-10 MED ORDER — SPIRONOLACTONE 25 MG PO TABS: 12.5000 mg | ORAL_TABLET | Freq: Every day | ORAL | Status: DC

## 2014-07-10 MED ORDER — POTASSIUM CHLORIDE CRYS ER 10 MEQ PO TBCR: 10.0000 meq | EXTENDED_RELEASE_TABLET | Freq: Once | ORAL | Status: DC

## 2014-07-10 NOTE — Telephone Encounter (Signed)
New message      Call to give an update to [place].  Pt is still weak.  He was dehydrated from the fluid pills.

## 2014-07-10 NOTE — Telephone Encounter (Signed)
I spoke with Dennis Merle, NP who advises patient restart Lasix 20 mg once daily, Spironolactone 12.5 mg once daily and Kdur 7meq once daily; repeat bmet in 1 week.  I reported these changes to patient's wife who verbalized understanding and agreement.  Patient is scheduled for lab appointment on 6/23.

## 2014-07-10 NOTE — Telephone Encounter (Signed)
Spoke with patient's wife who called to give a follow-up report on how patient is feeling since calling in on 6/13.  Patient is currently holding diuretics and potassium supplement as instructed by Truitt Merle, NP on 6/13.  She reports BP 129/78, HR 91 bpm this morning before going to pharmacy; 100/60, HR 92 after returning from errand.  She reports patient continues to be weak and get very tired with activity but this is not new for the patient; he has multiple pulmonary emboli and requires supplemental oxygen.  I advised her that [name] is out of the office this week and that I will review with Truitt Merle, NP for recommendations on restarting diuretics.  She verbalized understanding and agreement.

## 2014-07-13 NOTE — Telephone Encounter (Signed)
His labs also suggest significant volume depletion Diuretics have been held Will re-address on Wednesday

## 2014-07-17 ENCOUNTER — Telehealth: Payer: Self-pay | Admitting: Cardiovascular Disease

## 2014-07-17 ENCOUNTER — Other Ambulatory Visit: Payer: Self-pay | Admitting: Emergency Medicine

## 2014-07-17 DIAGNOSIS — I2782 Chronic pulmonary embolism: Secondary | ICD-10-CM

## 2014-07-17 DIAGNOSIS — I272 Pulmonary hypertension, unspecified: Secondary | ICD-10-CM

## 2014-07-17 DIAGNOSIS — I5032 Chronic diastolic (congestive) heart failure: Secondary | ICD-10-CM

## 2014-07-17 NOTE — Telephone Encounter (Signed)
Spoke with patient's wife who states they are in agreement with getting cardiopulmonary stress test.  She states they would like an answer to the reason for his SOB and trust Dr. Elmarie Shiley advice.  I reviewed pre-test instructions with her including for patient to hold Carvedilol the night before and the morning of the test; she states she wrote these down to review with her husband.  I advised that the patient will receive a call to schedule the test.  She verbalized understanding and agreement and will call back with questions or concerns.

## 2014-07-17 NOTE — Telephone Encounter (Signed)
Lets get a cardiopulmonary stress test.

## 2014-07-17 NOTE — Telephone Encounter (Signed)
Spoke with patient's wife who states patient continues to have SOB despite the reduction of blood clots in the lungs (states large clots are dissolved and residual ones remain) and swelling in lower extremities is resolved.  States BP has been well-controlled.   BP readings:  Mon 130/77, HR 90 bpm Tues 152/87, HR 90's bpm Today  157/76, HR 85 bpm Wife states patient complains that he is wiped out just from walking the short distance down the driveway to the mailbox and back.  Patient would like to know why SOB continues to get worse.  I advised that I will send message to [name] who is in the office today for advice and call them back.  Wife verbalized understanding and agreement.

## 2014-07-17 NOTE — Telephone Encounter (Signed)
Pt c/o Shortness Of Breath: STAT if SOB developed within the last 24 hours or pt is noticeably SOB on the phone  1. Are you currently SOB (can you hear that pt is SOB on the phone)? Yes  2. How long have you been experiencing SOB? Ongoing since April  3. Are you SOB when sitting or when up moving around? When moving around  4. Are you currently experiencing any other symptoms? No

## 2014-07-18 ENCOUNTER — Other Ambulatory Visit (INDEPENDENT_AMBULATORY_CARE_PROVIDER_SITE_OTHER): Payer: BLUE CROSS/BLUE SHIELD | Admitting: *Deleted

## 2014-07-18 DIAGNOSIS — I5032 Chronic diastolic (congestive) heart failure: Secondary | ICD-10-CM | POA: Diagnosis not present

## 2014-07-18 LAB — BASIC METABOLIC PANEL
BUN: 26 mg/dL — ABNORMAL HIGH (ref 6–23)
CO2: 22 mEq/L (ref 19–32)
Calcium: 9.1 mg/dL (ref 8.4–10.5)
Chloride: 107 mEq/L (ref 96–112)
Creatinine, Ser: 1.91 mg/dL — ABNORMAL HIGH (ref 0.40–1.50)
GFR: 36.84 mL/min — ABNORMAL LOW (ref >60.00)
GLUCOSE: 94 mg/dL (ref 70–99)
Potassium: 4.5 mEq/L (ref 3.5–5.1)
Sodium: 137 mEq/L (ref 135–145)

## 2014-07-18 NOTE — Addendum Note (Signed)
Addended by: Eulis Foster on: 07/18/2014 10:18 AM   Modules accepted: Orders

## 2014-07-22 ENCOUNTER — Ambulatory Visit (HOSPITAL_COMMUNITY): Payer: BLUE CROSS/BLUE SHIELD | Attending: Cardiovascular Disease

## 2014-07-22 DIAGNOSIS — I5022 Chronic systolic (congestive) heart failure: Secondary | ICD-10-CM | POA: Insufficient documentation

## 2014-07-22 DIAGNOSIS — I270 Primary pulmonary hypertension: Secondary | ICD-10-CM

## 2014-07-22 DIAGNOSIS — I5032 Chronic diastolic (congestive) heart failure: Secondary | ICD-10-CM

## 2014-07-22 DIAGNOSIS — I272 Other secondary pulmonary hypertension: Secondary | ICD-10-CM | POA: Insufficient documentation

## 2014-07-22 DIAGNOSIS — I2782 Chronic pulmonary embolism: Secondary | ICD-10-CM | POA: Insufficient documentation

## 2014-07-23 ENCOUNTER — Telehealth: Payer: Self-pay | Admitting: Cardiovascular Disease

## 2014-07-23 ENCOUNTER — Ambulatory Visit (INDEPENDENT_AMBULATORY_CARE_PROVIDER_SITE_OTHER): Payer: BLUE CROSS/BLUE SHIELD | Admitting: *Deleted

## 2014-07-23 DIAGNOSIS — I2699 Other pulmonary embolism without acute cor pulmonale: Secondary | ICD-10-CM

## 2014-07-23 DIAGNOSIS — I82401 Acute embolism and thrombosis of unspecified deep veins of right lower extremity: Secondary | ICD-10-CM | POA: Diagnosis not present

## 2014-07-23 DIAGNOSIS — Z5181 Encounter for therapeutic drug level monitoring: Secondary | ICD-10-CM | POA: Diagnosis not present

## 2014-07-23 DIAGNOSIS — I82409 Acute embolism and thrombosis of unspecified deep veins of unspecified lower extremity: Secondary | ICD-10-CM

## 2014-07-23 LAB — POCT INR: INR: 2

## 2014-07-23 NOTE — Telephone Encounter (Signed)
New message     patient wife calling for test results

## 2014-07-24 NOTE — Telephone Encounter (Signed)
F/u    Pt following up on previous message concerning test results.

## 2014-07-24 NOTE — Telephone Encounter (Signed)
Spoke with patient's wife who is calling for CPX results for patient.  I advised that per [name], the test needs to be read by Dr. Haroldine Laws for final interpretation; there are very specific measurements that we are measured and he is the physician trained in reading them.  I advised her that we will call as soon as the results are available.  Patient's wife verbalized understanding and agreement.

## 2014-07-27 ENCOUNTER — Other Ambulatory Visit: Payer: Self-pay | Admitting: Emergency Medicine

## 2014-07-30 ENCOUNTER — Telehealth: Payer: Self-pay | Admitting: Cardiovascular Disease

## 2014-07-30 NOTE — Telephone Encounter (Signed)
New message      Pt calling regarding heart/lung stress test results.  Please call to advise

## 2014-07-30 NOTE — Telephone Encounter (Signed)
Returned call to patient's wife.She was calling to get results from cardiopulmonary stress test done 07/22/14.Advised final report not available.Advised I will let Dr.[name]'s nurse know you called.

## 2014-07-31 ENCOUNTER — Telehealth: Payer: Self-pay | Admitting: Nurse Practitioner

## 2014-07-31 NOTE — Telephone Encounter (Signed)
The cardiopulmonary stress test shows mild functional impairment that seems to be cardiac.     I think he needs a right and left heart cath . It has been over a month since I saw him so maybe we should work him in tomorrow at the open slot so I can go over the cath and results.    He will need to hold coumadin so please schedule it for next week .     Thanks    Spoke with patient and his wife and reviewed results.  Patient is scheduled to see [name] tomorrow, 7/7 and cath will be scheduled after that visit.  Patient and wife verbalized understanding and agreement.

## 2014-08-01 ENCOUNTER — Ambulatory Visit (INDEPENDENT_AMBULATORY_CARE_PROVIDER_SITE_OTHER): Payer: BLUE CROSS/BLUE SHIELD | Admitting: Cardiovascular Disease

## 2014-08-01 ENCOUNTER — Encounter: Payer: Self-pay | Admitting: Cardiovascular Disease

## 2014-08-01 VITALS — BP 110/66 | HR 80 | Ht 69.0 in | Wt 218.0 lb

## 2014-08-01 DIAGNOSIS — I5032 Chronic diastolic (congestive) heart failure: Secondary | ICD-10-CM | POA: Insufficient documentation

## 2014-08-01 DIAGNOSIS — I2699 Other pulmonary embolism without acute cor pulmonale: Secondary | ICD-10-CM | POA: Diagnosis not present

## 2014-08-01 NOTE — Progress Notes (Signed)
Patient Name: [name] Date of Encounter: 08/01/2014  Primary Care Provider:   Melinda Crutch, MD Primary Cardiologist:  Joaquim Nam, MD   Chief Complaint  74 year old male who presents for hospital follow-up related to recent admission for bilateral PE and right lower extremity DVT.  Past Medical History   Past Medical History  Diagnosis Date  . Chronic diastolic CHF (congestive heart failure)     a. His initial ejection fraction was between 30 and 35%;  b. 04/2013 Echo: EF 50-55% Gr1 DD, mild-mod MR;  c. 04/2014 Echo: EF 60-65%, Gr 1 DD, triv TR.  Marland Kitchen Thyroid disease   . Pulmonary HTN   . Hypertension   . Addison's disease   . Hypogonadism male   . CKD (chronic kidney disease), stage III   . History of aortic insufficiency   . Hx of cardiovascular stress test     a. ETT-Myoview 7/14:  Low risk, inferior defect consistent with thinning, no ischemia, normal wall motion, EF 54%  . DVT (deep venous thrombosis)     a. 02/2013 Bilat LE-->Xarelto later d/c'd 2/2 GIB;  b. 04/2014 Acute Right PT, Peroneal, Popliteal, Femoral, and common femoral DVT-->coumadoin  . GERD (gastroesophageal reflux disease)   . H/O hiatal hernia   . Hepatitis     a. 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  . Daily headache   . Chronic back pain   . Peptic ulcer disease   . On home oxygen therapy     a. added 04/2014 in setting of PE.  Marland Kitchen Acute lower GI bleeding     a. admitted to [facility] 04/11/2013;  b. 04/2014 EGD: 1.  gastric erosions, mild prox gastritis and bulbar duodenitis->Protonix.  . Pulmonary embolism     a. 04/2014 CTA: Bilat PE w/ right heart strain-->coumadin.   Past Surgical History  Procedure Laterality Date  . Cardiac catheterization  06/26/2008    EF 30-35%  . US echocardiography  04/09/2010    EF 60-65%  . Tonsillectomy  1940's  . Appendectomy  1950's  . Cholecystectomy  ~ 1965  . Esophagogastroduodenoscopy N/A 04/11/2013    Procedure: ESOPHAGOGASTRODUODENOSCOPY (EGD);  Surgeon: Missy Sabins, MD;  Location: [facility]  ENDOSCOPY;  Service: Endoscopy;  Laterality: N/A;  . Esophagogastroduodenoscopy N/A 05/13/2014    Procedure: ESOPHAGOGASTRODUODENOSCOPY (EGD);  Surgeon: Arta Silence, MD;  Location: [facility] ENDOSCOPY;  Service: Endoscopy;  Laterality: N/A;    Allergies  No Known Allergies  HPI He was seen by [name], NP in April, 2016 - note is as below  74 year old male with the above complex problem list. I last saw him in clinic about 2 weeks ago, at which time he reported a 4 week history of dyspnea and lower extremity edema, which was initially treated as an upper respiratory infection. He did have evidence of volume overload on exam and we increased his Lasix. He was seen 3 days later secondary to ongoing dyspnea despite diuresis. D-dimer was drawn and was elevated at 4. He was referred to the hospital for CT angiography. This was performed early on April 16 and revealed bilateral submassive pulmonary emboli. He was admitted to medicine and placed on heparin. Radical care in interventional radiology were consulted and did not feel that he met criteria for TPA. He was hemodynamically stable throughout his hospitalization and converted to Coumadin. Prior to this, he was seen by GI given prior history of GI bleeding and an EGD was performed on April 18 and showed gastric erosions without evidence  of active bleeding. PPI therapy was prescribed.  Since his discharge, he has continued to have dyspnea with minimal exertion. He has been on Lovenox bridging and finish his last Lovenox injection yesterday morning. He has not yet had her INR checked. He is not having chest pain, palpitations, PND, orthopnea, dizziness, syncope, edema, or early satiety. Previous symptoms of volume overload including increasing abdominal girth and edema have more or less resolved.   August 01, 2014:    Tae returns for evaluation of his chronic shortness of breath and leg edema. The cardiopulmonary stress test revealed mild functional  impairment suggestive of a circulatory issue. He returns today to discuss right left heart catheterization.  Still has shortness of breath. Unable to do anything without gasping for air - doing ADLs cause significant dyspnea . Has had progressive DOE over the past month or so    Home Medications  Current Outpatient Prescriptions  Medication Sig Dispense Refill  . carvedilol (COREG) 25 MG tablet Take 1 tablet (25 mg total) by mouth 2 (two) times daily with a meal. 180 tablet 1  . Cholecalciferol (VITAMIN D3 PO) Take 1,000 capsules by mouth daily.     . furosemide (LASIX) 40 MG tablet Take 0.5 tablets (20 mg total) by mouth daily. 180 tablet 1  . gabapentin (NEURONTIN) 600 MG tablet Take 600 mg by mouth daily.    . hydrocortisone (CORTEF) 20 MG tablet Take 20 mg by mouth 2 (two) times daily. Pt take 1/2 tab in QAM and 1 tab in QPM    . lisinopril (PRINIVIL,ZESTRIL) 10 MG tablet Take 1 tablet (10 mg total) by mouth daily. 90 tablet 2  . metoCLOPramide (REGLAN) 5 MG tablet Take 5 mg by mouth daily.     . pantoprazole (PROTONIX) 40 MG tablet Take 1 tablet (40 mg total) by mouth daily. 30 tablet 0  . potassium chloride (K-DUR,KLOR-CON) 10 MEQ tablet Take 1 tablet (10 mEq total) by mouth once. 180 tablet 1  . PROAIR HFA 108 (90 BASE) MCG/ACT inhaler INHALE 2 PUFFS INTO THE LUNGS EVERY 6 (SIX) HOURS AS NEEDED FOR WHEEZING OR SHORTNESS OF BREATH. 8.5 each 2  . promethazine (PHENERGAN) 25 MG tablet Take 25 mg by mouth every 6 (six) hours as needed.  0  . thyroid (ARMOUR) 60 MG tablet Take 60 mg by mouth daily.     . traMADol (ULTRAM) 50 MG tablet Take 50 mg by mouth 3 (three) times daily.     . traZODone (DESYREL) 100 MG tablet Take 50-100 mg by mouth every evening.   2  . warfarin (COUMADIN) 3 MG tablet Take as directed by coumadin clinic 30 tablet 0  . spironolactone (ALDACTONE) 25 MG tablet Take 0.5 tablets (12.5 mg total) by mouth daily. (Patient not taking: Reported on 08/01/2014) 31 tablet 11    No current facility-administered medications for this visit.  Review of Systems  As above, he continues to experience DOE.  He denies chest pain, palpitations, pnd, orthopnea, n, v, dizziness, syncope, edema, weight gain, or early satiety. All other systems reviewed and are otherwise negative except as noted above.  Physical Exam  VS:  BP 110/66 mmHg  Pulse 80  Ht _0  (1.753 m)  Wt 98.884 kg (218 lb)  BMI 32.18 kg/m2  SpO2 96% , BMI Body mass index is 32.18 kg/(m^2). GEN: Well nourished, well developed, in no acute distress. HEENT: normal. Neck: Supple, no JVD, carotid bruits, or masses. Cardiac: RRR, no murmurs, rubs, or gallops. No clubbing, cyanosis.  Trace bilat LE edema.  Radials/DP/PT 2+ and equal bilaterally.  Respiratory:  Respirations regular and unlabored, clear to auscultation bilaterally. GI: Soft, nontender, nondistended, BS + x 4. MS: no deformity or atrophy. Skin: warm and dry, no rash. Neuro:  Strength and sensation are intact. Psych: Normal affect.  Accessory Clinical Findings  INR pending today  Assessment & Plan  1.  Bilateral pulmonary emboli/right lower extremity DVT:    On warfarin,    2. Chronic diastolic congestive heart failure: Volume appears stable today. Heart rate and blood pressure stable. Continue Lasix at current dose. spirinolactone was stopped by his nephrologist.  He did not notice any worsening of his dyspnea.   3. Hypertension: Stable on current regimen including blocker, ACE inhibitor, and calcium channel blocker therapy.     4. Pulmonary hypertension: He had a cardiac cath  in 2010 which revealed secondary pulmonary  hypertension.   He has had bilateral pulmonary emboli since his last cath. Will schedule him for R and L heart cath for several weeks with dr. Haroldine Laws .    Deanglo Hissong, Wonda Cheng, MD   08/01/2014 3:10 PM    [address],  [place], [place]  [address] Pager [phone] Phone: [phone]; Fax: [phone]   [facility]  [address] [place], [place]  [address] [phone]    Fax [phone]

## 2014-08-01 NOTE — Patient Instructions (Signed)
Medication Instructions:  Your physician recommends that you continue on your current medications as directed. Please refer to the Current Medication list given to you today.   Labwork: Your physician recommends that you return for lab work in: at least 2 days before your heart cath   Testing/Procedures: Your physician has requested that you have a cardiac catheterization.  Cardiac catheterization is used to diagnose and/or treat various heart conditions. Doctors may recommend this procedure for a number of different reasons. The most common reason is to evaluate chest pain. Chest pain can be a symptom of coronary artery disease (CAD), and cardiac catheterization can show whether plaque is narrowing or blocking your heart's arteries. This procedure is also used to evaluate the valves, as well as measure the blood flow and oxygen levels in different parts of your heart. For further information please visit [email or web address]. Please follow instruction sheet, as given.  We will call you to schedule - your cath will be scheduled with Dr. Haroldine Laws when he returns from vacation the week of July 18  Follow-Up: Your physician wants you to follow-up in: 3 months with [name].  You will receive a reminder letter in the mail two months in advance. If you don't receive a letter, please call our office to schedule the follow-up appointment.

## 2014-08-03 ENCOUNTER — Other Ambulatory Visit: Payer: Self-pay | Admitting: Emergency Medicine

## 2014-08-05 ENCOUNTER — Telehealth: Payer: Self-pay | Admitting: Cardiovascular Disease

## 2014-08-05 NOTE — Telephone Encounter (Signed)
Spoke with patient who states he is having low blood pressure today and feeling fatigued.  He states he saw his nephrologist, Dr. Lauralyn Primes @ [facility] [phone]) today who prescribed 1 tablet of Lasix 40 mg today and tomorrow (patient has been taking 20 mg daily).  Patient asked to put his wife on the phone to explain other changes and symptoms to me.  She states Dr. Olivia Mackie also decreased Lisinopril to 5 mg daily and gave directions from for management of dye and fluids for upcoming heart cath.  I advised patient's wife that we will call to schedule that when Dr. Haroldine Laws returns.  She advised that patient has swelling in right ankle up to calf that started over the weekend.  I advised that per [name], there will be vascular and tissue changes due to past hx of DVT.  He advises continued foot elevation and continue medical therapy.  I advised her to call back with worsening problems or concerns.  Patient's wife verbalized understanding and agreement with plan of care.  Medication list updated to reflect change in Lisinopril.

## 2014-08-05 NOTE — Telephone Encounter (Signed)
New message      Pt c/o swelling: STAT is pt has developed SOB within 24 hours  1. How long have you been experiencing swelling? Noticed it over the weekend 2. Where is the swelling located? Rt ankle up to calf 3.  Are you currently taking a "fluid pill"? yes  4.  Are you currently SOB? Pt is still SOB---nothing new  5.  Have you traveled recently?

## 2014-08-13 ENCOUNTER — Telehealth (HOSPITAL_COMMUNITY): Payer: Self-pay | Admitting: *Deleted

## 2014-08-13 ENCOUNTER — Ambulatory Visit (INDEPENDENT_AMBULATORY_CARE_PROVIDER_SITE_OTHER): Payer: BLUE CROSS/BLUE SHIELD | Admitting: *Deleted

## 2014-08-13 DIAGNOSIS — I2699 Other pulmonary embolism without acute cor pulmonale: Secondary | ICD-10-CM

## 2014-08-13 DIAGNOSIS — I82401 Acute embolism and thrombosis of unspecified deep veins of right lower extremity: Secondary | ICD-10-CM

## 2014-08-13 DIAGNOSIS — Z5181 Encounter for therapeutic drug level monitoring: Secondary | ICD-10-CM | POA: Diagnosis not present

## 2014-08-13 DIAGNOSIS — I82409 Acute embolism and thrombosis of unspecified deep veins of unspecified lower extremity: Secondary | ICD-10-CM | POA: Diagnosis not present

## 2014-08-13 LAB — POCT INR: INR: 2.4

## 2014-08-13 NOTE — Telephone Encounter (Signed)
[name] wife called earlier today very anxious to schedule pt's heart cath, per Dr Elmarie Shiley note he would like pt to do a R/L HC for pulm htn, Dr Haroldine Laws states this is ok to schedule.  Scheduled cath for 7/26 and called pt's wife with info, however she has tons of questions and is very concerned about his kidney function and holding coumadin.  Scheduled appt with Dr Haroldine Laws on Fri 7/22 to discuss all these issues and make a plan for cath, will leave sch for 7/26 at this time

## 2014-08-16 ENCOUNTER — Encounter (HOSPITAL_COMMUNITY): Payer: Self-pay | Admitting: *Deleted

## 2014-08-16 ENCOUNTER — Other Ambulatory Visit: Payer: Self-pay | Admitting: *Deleted

## 2014-08-16 ENCOUNTER — Encounter (HOSPITAL_COMMUNITY): Payer: Self-pay

## 2014-08-16 ENCOUNTER — Encounter (HOSPITAL_COMMUNITY): Payer: Self-pay | Admitting: Cardiology

## 2014-08-16 ENCOUNTER — Ambulatory Visit (HOSPITAL_COMMUNITY)
Admission: RE | Admit: 2014-08-16 | Discharge: 2014-08-16 | Disposition: A | Discharge status: Home / Self Care | Payer: BLUE CROSS/BLUE SHIELD | Source: Ambulatory Visit | Attending: Internal Medicine | Admitting: Internal Medicine

## 2014-08-16 VITALS — BP 114/70 | HR 77 | Ht 70.0 in | Wt 218.8 lb

## 2014-08-16 DIAGNOSIS — J988 Other specified respiratory disorders: Secondary | ICD-10-CM | POA: Diagnosis not present

## 2014-08-16 DIAGNOSIS — R0600 Dyspnea, unspecified: Secondary | ICD-10-CM | POA: Insufficient documentation

## 2014-08-16 DIAGNOSIS — I5032 Chronic diastolic (congestive) heart failure: Secondary | ICD-10-CM | POA: Diagnosis not present

## 2014-08-16 DIAGNOSIS — I2699 Other pulmonary embolism without acute cor pulmonale: Secondary | ICD-10-CM | POA: Insufficient documentation

## 2014-08-16 DIAGNOSIS — N183 Chronic kidney disease, stage 3 (moderate): Secondary | ICD-10-CM | POA: Diagnosis not present

## 2014-08-16 DIAGNOSIS — E669 Obesity, unspecified: Secondary | ICD-10-CM | POA: Insufficient documentation

## 2014-08-16 NOTE — Progress Notes (Signed)
Pt scheduled for R/L Heart cath on 08/20/14 with Dr. Haroldine Laws Cpt [phone] With pts current insurance- [employer]

## 2014-08-16 NOTE — Progress Notes (Signed)
Patient ID: [name], male   DOB: [birth year], 74 y.o.   MRN: [id number]  Referring Physician: [name] Primary Care: Primary Cardiologist:  [name] HF: Bensimhon  HPI: [name] is a 74 y.o. with hx of obesity, chronic SOB, diastolic CHF, CKD stage 3 (baseline cr 1.9-2.0), HTN, Addison disease, submassive bilateral PE in 4/16 referred by [name] for persistent pulmonary HTN.  He has long h/o chronic dyspnea. Has been followed by Drs. [name] and [name]. Had CT scan in 2010 which was normal. Echo with EF 40-45%. (previously noted to be 30-35%). Echo repeated in 2012 and EF normalized.   In 2015 had superficial thrombophlebitis. Started on Xarelto and had GI bleed.   He was admitted to hospital on 05/10/14 for worsening SOB and was found to have submassive PEs. He did not meet criteria for TPA.  He was placed on heparin drip and transitioned to coumadin prior to d/c.   Echo with EF 55-60%. RV noted to be normal. In 7/14 normal Myoview.   Has had persistent dyspnea. No SOB with just minimal activity like walking to mailbox. F/u CT scan of chest in 5/16 was normal except for small pulmonary nodule and mild calcification of LAD. No residual PE. Non-smoker. Wife says he snores just a little bit. No witnessed apnea. Just mild LE edema. No orthopnea or PND. Has gained 20 pounds in about 6 months. Nonsmoker.   He presents today to establish prior to a [facility] with Dr Haroldine Laws on 08/21/14.  PFTs  5/15 FEV1 2.7L FVC   3.88 DLCO 60%  CPX 07/22/14 FVC 3.75, FEV1 2.37, FEV1/FVC 63 BP rest: 124/70 BP peak: 216/92 Peak VO2: 16.8 (76% predicted peak VO2) - when corrected to ideal weight the peak VO2 is 21.2 ml/kg (ibw)/min (81% of the ibw-adjusted predicted).  VE/VCO2 slope: 37 OUES: 1.8 Peak RER: 1.11 Ventilatory Threshold: 9.2 (41.6% predicted measured peak VO2) VE/MVV: 68% PETCO2 at peak: 31 O2pulse: 12  (80% predicted O2pulse)  Past Medical History  Diagnosis Date  .  Chronic diastolic CHF (congestive heart failure)     a. His initial ejection fraction was between 30 and 35%;  b. 04/2013 Echo: EF 50-55% Gr1 DD, mild-mod MR;  c. 04/2014 Echo: EF 60-65%, Gr 1 DD, triv TR.  Marland Kitchen Thyroid disease   . Pulmonary HTN   . Hypertension   . Addison's disease   . Hypogonadism male   . CKD (chronic kidney disease), stage III   . History of aortic insufficiency   . Hx of cardiovascular stress test     a. ETT-Myoview 7/14:  Low risk, inferior defect consistent with thinning, no ischemia, normal wall motion, EF 54%  . DVT (deep venous thrombosis)     a. 02/2013 Bilat LE-->Xarelto later d/c'd 2/2 GIB;  b. 04/2014 Acute Right PT, Peroneal, Popliteal, Femoral, and common femoral DVT-->coumadoin  . GERD (gastroesophageal reflux disease)   . H/O hiatal hernia   . Hepatitis     a. 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  . Daily headache   . Chronic back pain   . Peptic ulcer disease   . On home oxygen therapy     a. added 04/2014 in setting of PE.  Marland Kitchen Acute lower GI bleeding     a. admitted to [facility] 04/11/2013;  b. 04/2014 EGD: 1.  gastric erosions, mild prox gastritis and bulbar duodenitis->Protonix.  . Pulmonary embolism     a. 04/2014 CTA: Bilat PE w/ right heart strain-->coumadin.    Current  Outpatient Prescriptions  Medication Sig Dispense Refill  . carvedilol (COREG) 25 MG tablet Take 1 tablet (25 mg total) by mouth 2 (two) times daily with a meal. 180 tablet 1  . Cholecalciferol (VITAMIN D3 PO) Take 1,000 capsules by mouth daily.     . furosemide (LASIX) 40 MG tablet Take 0.5 tablets (20 mg total) by mouth daily. 180 tablet 1  . gabapentin (NEURONTIN) 600 MG tablet Take 600 mg by mouth daily.    . hydrocortisone (CORTEF) 20 MG tablet Take 20 mg by mouth 2 (two) times daily. Pt take 1/2 tab in QAM and 1 tab in QPM    . lisinopril (PRINIVIL,ZESTRIL) 10 MG tablet Take 5 mg by mouth daily.    . metoCLOPramide (REGLAN) 5 MG tablet Take 5 mg by mouth daily.     . pantoprazole (PROTONIX) 40 MG tablet  Take 1 tablet (40 mg total) by mouth daily. 30 tablet 0  . potassium chloride (K-DUR,KLOR-CON) 10 MEQ tablet Take 1 tablet (10 mEq total) by mouth once. 180 tablet 1  . PROAIR HFA 108 (90 BASE) MCG/ACT inhaler INHALE 2 PUFFS INTO THE LUNGS EVERY 6 (SIX) HOURS AS NEEDED FOR WHEEZING OR SHORTNESS OF BREATH. 8.5 Inhaler 2  . promethazine (PHENERGAN) 25 MG tablet Take 25 mg by mouth every 6 (six) hours as needed.  0  . thyroid (ARMOUR) 60 MG tablet Take 60 mg by mouth daily.     . traMADol (ULTRAM) 50 MG tablet Take 50 mg by mouth 3 (three) times daily.     . traZODone (DESYREL) 100 MG tablet Take 50-100 mg by mouth every evening.   2  . warfarin (COUMADIN) 3 MG tablet Take as directed by coumadin clinic 30 tablet 0   No current facility-administered medications for this encounter.    No Known Allergies    History   Social History  . Marital Status: Married    Spouse Name: N/A  . Number of Children: N/A  . Years of Education: N/A   Occupational History  . retired    Social History Main Topics  . Smoking status: Never Smoker   . Smokeless tobacco: Never Used  . Alcohol Use: No  . Drug Use: No  . Sexual Activity: Not Currently   Other Topics Concern  . Not on file   Social History Narrative      Family History  Problem Relation Age of Onset  . Clotting disorder Mother   . Clotting disorder Brother   . Clotting disorder Brother   . Clotting disorder Brother   . Clotting disorder Other     Niece.    Filed Vitals:   08/16/14 1034  BP: 114/70  Pulse: 77  Height: 5\' 10"  (1.778 m)  Weight: 218 lb 12.8 oz (99.247 kg)  SpO2: 100%    PHYSICAL EXAM: General:  Well appearing. No respiratory difficulty. Hoarse voice HEENT: normal Neck: supple. no JVD. Carotids 2+ bilat; no bruits. No lymphadenopathy or thryomegaly appreciated. Cor: PMI nondisplaced. Regular rate & rhythm. +s4 no murmur Lungs: clear Abdomen: obese soft, nontender, nondistended. No hepatosplenomegaly. No  bruits or masses. Good bowel sounds. Extremities: no cyanosis, clubbing, rash, edema Neuro: alert & oriented x 3, cranial nerves grossly intact. moves all 4 extremities w/o difficulty. Affect pleasant.  ECG: 05/10/14 NSR 88 normal axis. No ST-T wave abnormalities.     ASSESSMENT & PLAN:  1. Chronic dyspnea 2. Submassive PE 4/16    --f/u CT scan 5/16 no residual clot 3.  Obesity 4. Mild obstructive airway disease 5. CKD, stage 3  Complex situation. I have reviewed all of his studies closely as described in HPI. He has chronic dyspnea which has gotten much worse over the past 6 months to 1 year. This is complicated by recent submassive PE which have resolved on follow CT scan. In looking at his CPX testing, his functional capacit is just mildly decreased and when corrected for normal body actually comes up into the normal range. That said his Ve/VCo2 slope is elevated and suggestive of higher filling pressures during exercise. I suspect his dyspnea is multifactorial but that his weight may be playing a significant role but there is also evidence of COPD and diastolic HF.   I have recommended a low-carb diet for weight loss and we will proceed with L/R cath to further evaluate. We discussed risks of contrast nephropathy. He will also need a sleep study at some point and possible VQ to exclude chronic PE.   Total time spent 45 minutes. Over half that time spent discussing above.   Bensimhon, Daniel,MD 11:43 AM

## 2014-08-16 NOTE — Patient Instructions (Signed)
Heart Catheterization scheduled for 7/26, see instruction sheet  Follow up with Dr Acie Fredrickson

## 2014-08-16 NOTE — Addendum Note (Signed)
Encounter addended by: Scarlette Calico, RN on: 08/16/2014 12:06 PM<BR>     Documentation filed: Patient Instructions Section

## 2014-08-20 ENCOUNTER — Encounter (HOSPITAL_COMMUNITY)
Admission: RE | Disposition: A | Discharge status: Home / Self Care | Payer: Self-pay | Source: Ambulatory Visit | Attending: Cardiovascular Disease

## 2014-08-20 ENCOUNTER — Encounter (HOSPITAL_COMMUNITY): Payer: Self-pay | Admitting: Internal Medicine

## 2014-08-20 ENCOUNTER — Inpatient Hospital Stay (HOSPITAL_COMMUNITY)
Admission: RE | Admit: 2014-08-20 | Discharge: 2014-08-22 | DRG: 246 | Disposition: A | Discharge status: Home / Self Care | Payer: BLUE CROSS/BLUE SHIELD | Source: Ambulatory Visit | Attending: Cardiovascular Disease | Admitting: Cardiovascular Disease

## 2014-08-20 DIAGNOSIS — Z86711 Personal history of pulmonary embolism: Secondary | ICD-10-CM

## 2014-08-20 DIAGNOSIS — N183 Chronic kidney disease, stage 3 (moderate): Secondary | ICD-10-CM

## 2014-08-20 DIAGNOSIS — Z86718 Personal history of other venous thrombosis and embolism: Secondary | ICD-10-CM

## 2014-08-20 DIAGNOSIS — I2510 Atherosclerotic heart disease of native coronary artery without angina pectoris: Secondary | ICD-10-CM | POA: Diagnosis not present

## 2014-08-20 DIAGNOSIS — I5032 Chronic diastolic (congestive) heart failure: Secondary | ICD-10-CM | POA: Diagnosis present

## 2014-08-20 DIAGNOSIS — R0600 Dyspnea, unspecified: Secondary | ICD-10-CM | POA: Diagnosis not present

## 2014-08-20 DIAGNOSIS — Z79899 Other long term (current) drug therapy: Secondary | ICD-10-CM

## 2014-08-20 DIAGNOSIS — I2542 Coronary artery dissection: Secondary | ICD-10-CM | POA: Diagnosis present

## 2014-08-20 DIAGNOSIS — I200 Unstable angina: Secondary | ICD-10-CM | POA: Diagnosis present

## 2014-08-20 DIAGNOSIS — Z955 Presence of coronary angioplasty implant and graft: Secondary | ICD-10-CM

## 2014-08-20 DIAGNOSIS — I272 Other secondary pulmonary hypertension: Secondary | ICD-10-CM | POA: Diagnosis present

## 2014-08-20 DIAGNOSIS — J449 Chronic obstructive pulmonary disease, unspecified: Secondary | ICD-10-CM | POA: Diagnosis present

## 2014-08-20 DIAGNOSIS — E079 Disorder of thyroid, unspecified: Secondary | ICD-10-CM | POA: Diagnosis present

## 2014-08-20 DIAGNOSIS — I129 Hypertensive chronic kidney disease with stage 1 through stage 4 chronic kidney disease, or unspecified chronic kidney disease: Secondary | ICD-10-CM | POA: Diagnosis present

## 2014-08-20 DIAGNOSIS — Z7901 Long term (current) use of anticoagulants: Secondary | ICD-10-CM

## 2014-08-20 DIAGNOSIS — E669 Obesity, unspecified: Secondary | ICD-10-CM | POA: Diagnosis present

## 2014-08-20 DIAGNOSIS — Z9981 Dependence on supplemental oxygen: Secondary | ICD-10-CM

## 2014-08-20 DIAGNOSIS — Z8711 Personal history of peptic ulcer disease: Secondary | ICD-10-CM

## 2014-08-20 DIAGNOSIS — K219 Gastro-esophageal reflux disease without esophagitis: Secondary | ICD-10-CM | POA: Diagnosis present

## 2014-08-20 DIAGNOSIS — E271 Primary adrenocortical insufficiency: Secondary | ICD-10-CM | POA: Diagnosis present

## 2014-08-20 DIAGNOSIS — Z6831 Body mass index (BMI) 31.0-31.9, adult: Secondary | ICD-10-CM

## 2014-08-20 HISTORY — DX: Obesity, unspecified: E66.9

## 2014-08-20 HISTORY — PX: CARDIAC CATHETERIZATION: SHX172

## 2014-08-20 HISTORY — DX: Anemia, unspecified: D64.9

## 2014-08-20 HISTORY — DX: Atherosclerotic heart disease of native coronary artery without angina pectoris: I25.10

## 2014-08-20 LAB — POCT I-STAT 3, VENOUS BLOOD GAS (G3P V)
ACID-BASE DEFICIT: 1 mmol/L (ref 0.0–2.0)
ACID-BASE DEFICIT: 1 mmol/L (ref 0.0–2.0)
Acid-Base Excess: 2 mmol/L (ref 0.0–2.0)
BICARBONATE: 26.5 meq/L — AB (ref 20.0–24.0)
Bicarbonate: 23.9 mEq/L (ref 20.0–24.0)
Bicarbonate: 24 mEq/L (ref 20.0–24.0)
Bicarbonate: 24.6 mEq/L — ABNORMAL HIGH (ref 20.0–24.0)
O2 Saturation: 69 %
O2 Saturation: 71 %
O2 Saturation: 74 %
O2 Saturation: 68 %
PCO2 VEN: 39.2 mmHg — AB (ref 45.0–50.0)
PCO2 VEN: 40 mmHg — AB (ref 45.0–50.0)
PCO2 VEN: 42.2 mmHg — AB (ref 45.0–50.0)
PH VEN: 7.406 — AB (ref 7.250–7.300)
PH VEN: 7.407 — AB (ref 7.250–7.300)
PO2 VEN: 36 mmHg (ref 30.0–45.0)
TCO2: 25 mmol/L (ref 0–100)
TCO2: 26 mmol/L (ref 0–100)
TCO2: 28 mmol/L (ref 0–100)
TCO2: 25 mmol/L (ref 0–100)
pCO2, Ven: 38.9 mmHg — ABNORMAL LOW (ref 45.0–50.0)
pH, Ven: 7.387 — ABNORMAL HIGH (ref 7.250–7.300)
pH, Ven: 7.397 — ABNORMAL HIGH (ref 7.250–7.300)
pO2, Ven: 38 mmHg (ref 30.0–45.0)
pO2, Ven: 39 mmHg (ref 30.0–45.0)
pO2, Ven: 36 mmHg (ref 30.0–45.0)

## 2014-08-20 LAB — BASIC METABOLIC PANEL
Anion gap: 10 (ref 5–15)
BUN: 25 mg/dL — AB (ref 6–20)
CO2: 24 mmol/L (ref 22–32)
Calcium: 8.9 mg/dL (ref 8.9–10.3)
Chloride: 106 mmol/L (ref 101–111)
Creatinine, Ser: 1.8 mg/dL — ABNORMAL HIGH (ref 0.61–1.24)
GFR calc non Af Amer: 36 mL/min — ABNORMAL LOW (ref >60)
GFR, EST AFRICAN AMERICAN: 41 mL/min — AB (ref >60)
Glucose, Bld: 102 mg/dL — ABNORMAL HIGH (ref 65–99)
Potassium: 3.8 mmol/L (ref 3.5–5.1)
Sodium: 140 mmol/L (ref 135–145)

## 2014-08-20 LAB — POCT I-STAT 3, ART BLOOD GAS (G3+)
Acid-base deficit: 2 mmol/L (ref 0.0–2.0)
Bicarbonate: 21.9 mEq/L (ref 20.0–24.0)
O2 Saturation: 93 %
PH ART: 7.415 (ref 7.350–7.450)
TCO2: 23 mmol/L (ref 0–100)
pCO2 arterial: 34.2 mmHg — ABNORMAL LOW (ref 35.0–45.0)
pO2, Arterial: 65 mmHg — ABNORMAL LOW (ref 80.0–100.0)

## 2014-08-20 LAB — PROTIME-INR
INR: 1.31 (ref 0.00–1.49)
Prothrombin Time: 16.4 s — ABNORMAL HIGH (ref 11.6–15.2)

## 2014-08-20 LAB — CBC
HCT: 35.4 % — ABNORMAL LOW (ref 39.0–52.0)
Hemoglobin: 11.3 g/dL — ABNORMAL LOW (ref 13.0–17.0)
MCH: 28.8 pg (ref 26.0–34.0)
MCHC: 31.9 g/dL (ref 30.0–36.0)
MCV: 90.3 fL (ref 78.0–100.0)
Platelets: 276 K/uL (ref 150–400)
RBC: 3.92 MIL/uL — ABNORMAL LOW (ref 4.22–5.81)
RDW: 15.8 % — ABNORMAL HIGH (ref 11.5–15.5)
WBC: 10.6 K/uL — ABNORMAL HIGH (ref 4.0–10.5)

## 2014-08-20 SURGERY — RIGHT/LEFT HEART CATH AND CORONARY ANGIOGRAPHY

## 2014-08-20 MED ORDER — HEPARIN (PORCINE) IN NACL 2-0.9 UNIT/ML-% IJ SOLN
INTRAMUSCULAR | Status: AC
  Filled 2014-08-20: qty 1000

## 2014-08-20 MED ORDER — SODIUM CHLORIDE 0.9 % IJ SOLN
3.0000 mL | Freq: Two times a day (BID) | INTRAMUSCULAR | Status: DC
Start: 2014-08-20 — End: 2014-08-20

## 2014-08-20 MED ORDER — ASPIRIN 81 MG PO CHEW
CHEWABLE_TABLET | ORAL | Status: AC
  Administered 2014-08-20: 81 mg via ORAL
  Filled 2014-08-20: qty 1

## 2014-08-20 MED ORDER — LIDOCAINE HCL (PF) 1 % IJ SOLN
INTRAMUSCULAR | Status: DC | PRN
  Administered 2014-08-20: 09:00:00

## 2014-08-20 MED ORDER — SODIUM CHLORIDE 0.9 % IV SOLN: 250.0000 mL | INTRAVENOUS | Status: DC | PRN

## 2014-08-20 MED ORDER — CARVEDILOL 12.5 MG PO TABS
25.0000 mg | ORAL_TABLET | Freq: Two times a day (BID) | ORAL | Status: DC
  Administered 2014-08-20 – 2014-08-22 (×5): 25 mg via ORAL
  Filled 2014-08-20 (×6): qty 1
  Filled 2014-08-20 (×2): qty 2

## 2014-08-20 MED ORDER — HEPARIN SODIUM (PORCINE) 5000 UNIT/ML IJ SOLN
5000.0000 [IU] | Freq: Three times a day (TID) | INTRAMUSCULAR | Status: DC
  Administered 2014-08-20 – 2014-08-21 (×2): 5000 [IU] via SUBCUTANEOUS
  Filled 2014-08-20 (×5): qty 1

## 2014-08-20 MED ORDER — SODIUM CHLORIDE 0.9 % IJ SOLN
3.0000 mL | Freq: Two times a day (BID) | INTRAMUSCULAR | Status: DC
  Administered 2014-08-20 – 2014-08-21 (×2): 3 mL via INTRAVENOUS

## 2014-08-20 MED ORDER — SODIUM BICARBONATE 8.4 % IV SOLN
INTRAVENOUS | Status: DC
  Filled 2014-08-20: qty 1000

## 2014-08-20 MED ORDER — TRAZODONE HCL 50 MG PO TABS
100.0000 mg | ORAL_TABLET | Freq: Every evening | ORAL | Status: DC
  Administered 2014-08-20 – 2014-08-21 (×2): 100 mg via ORAL
  Filled 2014-08-20: qty 2
  Filled 2014-08-20 (×2): qty 1
  Filled 2014-08-20: qty 2

## 2014-08-20 MED ORDER — FENTANYL CITRATE (PF) 100 MCG/2ML IJ SOLN
INTRAMUSCULAR | Status: AC
  Filled 2014-08-20: qty 2

## 2014-08-20 MED ORDER — GABAPENTIN 600 MG PO TABS
600.0000 mg | ORAL_TABLET | Freq: Every day | ORAL | Status: DC
  Administered 2014-08-21 – 2014-08-22 (×2): 600 mg via ORAL
  Filled 2014-08-20 (×2): qty 1

## 2014-08-20 MED ORDER — SODIUM CHLORIDE 0.9 % IV SOLN
INTRAVENOUS | Status: DC
  Administered 2014-08-20: 1000 mL via INTRAVENOUS

## 2014-08-20 MED ORDER — SODIUM CHLORIDE 0.9 % IJ SOLN: 3.0000 mL | INTRAMUSCULAR | Status: DC | PRN

## 2014-08-20 MED ORDER — LIDOCAINE HCL (PF) 1 % IJ SOLN
INTRAMUSCULAR | Status: AC
  Filled 2014-08-20: qty 30

## 2014-08-20 MED ORDER — ACETAMINOPHEN 325 MG PO TABS
650.0000 mg | ORAL_TABLET | ORAL | Status: DC | PRN
  Administered 2014-08-20 – 2014-08-22 (×4): 650 mg via ORAL
  Filled 2014-08-20 (×5): qty 2

## 2014-08-20 MED ORDER — NITROGLYCERIN 1 MG/10 ML FOR IR/CATH LAB
INTRA_ARTERIAL | Status: AC
  Filled 2014-08-20: qty 10

## 2014-08-20 MED ORDER — LISINOPRIL 5 MG PO TABS
5.0000 mg | ORAL_TABLET | Freq: Every day | ORAL | Status: DC
  Administered 2014-08-20 – 2014-08-22 (×3): 5 mg via ORAL
  Filled 2014-08-20 (×4): qty 1

## 2014-08-20 MED ORDER — METOCLOPRAMIDE HCL 5 MG PO TABS
5.0000 mg | ORAL_TABLET | Freq: Every day | ORAL | Status: DC
  Administered 2014-08-21 – 2014-08-22 (×2): 5 mg via ORAL
  Filled 2014-08-20 (×2): qty 1

## 2014-08-20 MED ORDER — CARVEDILOL 25 MG PO TABS: 25.0000 mg | ORAL_TABLET | Freq: Two times a day (BID) | ORAL | Status: DC

## 2014-08-20 MED ORDER — FENTANYL CITRATE (PF) 100 MCG/2ML IJ SOLN
INTRAMUSCULAR | Status: DC | PRN
  Administered 2014-08-20: 25 ug via INTRAVENOUS

## 2014-08-20 MED ORDER — ALBUTEROL SULFATE (2.5 MG/3ML) 0.083% IN NEBU
3.0000 mL | INHALATION_SOLUTION | Freq: Four times a day (QID) | RESPIRATORY_TRACT | Status: DC
  Administered 2014-08-20 – 2014-08-21 (×4): 3 mL via RESPIRATORY_TRACT
  Filled 2014-08-20 (×5): qty 3

## 2014-08-20 MED ORDER — PANTOPRAZOLE SODIUM 40 MG PO TBEC
40.0000 mg | DELAYED_RELEASE_TABLET | Freq: Every day | ORAL | Status: DC
  Administered 2014-08-21 – 2014-08-22 (×2): 40 mg via ORAL
  Filled 2014-08-20 (×2): qty 1

## 2014-08-20 MED ORDER — ASPIRIN 81 MG PO CHEW
81.0000 mg | CHEWABLE_TABLET | ORAL | Status: AC
Start: 2014-08-20 — End: 2014-08-20
  Administered 2014-08-20: 81 mg via ORAL

## 2014-08-20 MED ORDER — POTASSIUM CHLORIDE CRYS ER 10 MEQ PO TBCR
10.0000 meq | EXTENDED_RELEASE_TABLET | Freq: Once | ORAL | Status: AC
  Administered 2014-08-20: 10 meq via ORAL
  Filled 2014-08-20: qty 1

## 2014-08-20 MED ORDER — SODIUM CHLORIDE 0.9 % IV SOLN
INTRAVENOUS | Status: DC
  Administered 2014-08-20: 07:00:00 via INTRAVENOUS

## 2014-08-20 MED ORDER — SODIUM BICARBONATE BOLUS VIA INFUSION
INTRAVENOUS | Status: AC
  Administered 2014-08-20: 08:00:00 via INTRAVENOUS
  Filled 2014-08-20: qty 1

## 2014-08-20 MED ORDER — MIDAZOLAM HCL 2 MG/2ML IJ SOLN
INTRAMUSCULAR | Status: AC
  Filled 2014-08-20: qty 2

## 2014-08-20 MED ORDER — IOHEXOL 350 MG/ML SOLN
INTRAVENOUS | Status: DC | PRN
  Administered 2014-08-20: 70 mL via INTRACARDIAC

## 2014-08-20 MED ORDER — SODIUM CHLORIDE 0.9 % WEIGHT BASED INFUSION: 1.0000 mL/kg/h | INTRAVENOUS | Status: AC

## 2014-08-20 MED ORDER — MIDAZOLAM HCL 2 MG/2ML IJ SOLN
INTRAMUSCULAR | Status: DC | PRN
  Administered 2014-08-20: 1 mg via INTRAVENOUS

## 2014-08-20 MED ORDER — ONDANSETRON HCL 4 MG/2ML IJ SOLN
4.0000 mg | Freq: Four times a day (QID) | INTRAMUSCULAR | Status: DC | PRN
  Administered 2014-08-21: 4 mg via INTRAVENOUS
  Filled 2014-08-20: qty 2

## 2014-08-20 MED ORDER — ASPIRIN 81 MG PO CHEW
81.0000 mg | CHEWABLE_TABLET | ORAL | Status: AC
  Administered 2014-08-21: 81 mg via ORAL
  Filled 2014-08-20: qty 1

## 2014-08-20 MED ORDER — FENTANYL CITRATE (PF) 100 MCG/2ML IJ SOLN
25.0000 ug | INTRAMUSCULAR | Status: DC | PRN
  Administered 2014-08-20: 25 ug via INTRAVENOUS

## 2014-08-20 MED ORDER — THYROID 60 MG PO TABS
60.0000 mg | ORAL_TABLET | Freq: Every day | ORAL | Status: DC
  Administered 2014-08-21 – 2014-08-22 (×2): 60 mg via ORAL
  Filled 2014-08-20 (×2): qty 1

## 2014-08-20 SURGICAL SUPPLY — 12 items
CATH INFINITI 5FR MULTPACK ANG (CATHETERS) ×2 IMPLANT
CATH SWAN GANZ 7F STRAIGHT (CATHETERS) ×3 IMPLANT
HOVERMATT SINGLE USE (MISCELLANEOUS) ×2 IMPLANT
KIT HEART LEFT (KITS) ×3 IMPLANT
KIT HEART RIGHT NAMIC (KITS) ×3 IMPLANT
PACK CARDIAC CATHETERIZATION (CUSTOM PROCEDURE TRAY) ×3 IMPLANT
SHEATH PINNACLE 5F 10CM (SHEATH) ×2 IMPLANT
SHEATH PINNACLE 7F 10CM (SHEATH) ×2 IMPLANT
TRANSDUCER W/STOPCOCK (MISCELLANEOUS) ×6 IMPLANT
TUBING CIL FLEX 10 FLL-RA (TUBING) ×1 IMPLANT
WIRE EMERALD 3MM-J .025X260CM (WIRE) ×2 IMPLANT
WIRE EMERALD 3MM-J .035X150CM (WIRE) ×2 IMPLANT

## 2014-08-20 NOTE — H&P (Addendum)
HPI:  [name] is a 74 y.o. with hx of obesity, chronic SOB, diastolic CHF, CKD stage 3 (baseline cr 1.9-2.0), HTN, Addison disease, submassive bilateral PE in 4/16 referred by [name] for persistent pulmonary HTN.  He has long h/o chronic dyspnea. Has been followed by Drs. [name] and [name]. Had CT scan in 2010 which was normal. Echo with EF 40-45%. (previously noted to be 30-35%). Echo repeated in 2012 and EF normalized.   In 2015 had superficial thrombophlebitis. Started on Xarelto and had GI bleed.   He was admitted to hospital on 05/10/14 for worsening SOB and was found to have submassive PEs. He did not meet criteria for TPA. He was placed on heparin drip and transitioned to coumadin prior to d/c. Echo with EF 55-60%. RV noted to be normal. In 7/14 normal Myoview.   Has had persistent dyspnea. No SOB with just minimal activity like walking to mailbox. F/u CT scan of chest in 5/16 was normal except for small pulmonary nodule and mild calcification of LAD. No residual PE.   I saw him in clinic last week and we set him up for R/L cath today. Cath showed 2v CAD (RCA and LAD) with normal pulmonary pressures. Will admit overnight for hydration.   Review of Systems:     Cardiac Review of Systems: {Y] = yes [ ]  = no  Chest Pain [    ]  Resting SOB [  y ] Exertional SOB  Blue.Reese  ]  Orthopnea [  ]   Pedal Edema [   ]    Palpitations [  ] Syncope  [  ]   Presyncope [   ]  General Review of Systems: [Y] = yes [  ]=no Constitional: recent weight change [  ]; anorexia [  ]; fatigue Blue.Reese  ]; nausea [  ]; night sweats [  ]; fever [  ]; or chills [  ];                                                                     Dental: poor dentition[  ];   Eye : blurred vision [  ]; diplopia [   ]; vision changes [  ];  Amaurosis fugax[  ]; Resp: cough [  ];  wheezing[  ];  hemoptysis[  ]; shortness of breath[  ]; paroxysmal nocturnal dyspnea[  ]; dyspnea on exertion[  ]; or orthopnea[  ];    GI:  gallstones[  ], vomiting[  ];  dysphagia[  ]; melena[  ];  hematochezia [  ]; heartburn[  ];   GU: kidney stones [  ]; hematuria[  ];   dysuria [  ];  nocturia[  ];               Skin: rash [  ], swelling[  ];, hair loss[  ];  peripheral edema[  ];  or itching[  ]; Musculosketetal: myalgias[  ];  joint swelling[  ];  joint erythema[  ];  joint pain[  ];  back pain[  ];  Heme/Lymph: bruising[  ];  bleeding[  ];  anemia[  ];  Neuro: TIA[  ];  headaches[  ];  stroke[  ];  vertigo[  ];  seizures[  ];   paresthesias[  ];  difficulty walking[  ];  Psych:depression[  ]; anxiety[  ];  Endocrine: diabetes[  ];  thyroid dysfunction[  ];  Other:  Past Medical History  Diagnosis Date  . Chronic diastolic CHF (congestive heart failure)     a. His initial ejection fraction was between 30 and 35%;  b. 04/2013 Echo: EF 50-55% Gr1 DD, mild-mod MR;  c. 04/2014 Echo: EF 60-65%, Gr 1 DD, triv TR.  Marland Kitchen Thyroid disease   . Pulmonary HTN   . Hypertension   . Addison's disease   . Hypogonadism male   . CKD (chronic kidney disease), stage III   . History of aortic insufficiency   . Hx of cardiovascular stress test     a. ETT-Myoview 7/14:  Low risk, inferior defect consistent with thinning, no ischemia, normal wall motion, EF 54%  . DVT (deep venous thrombosis)     a. 02/2013 Bilat LE-->Xarelto later d/c'd 2/2 GIB;  b. 04/2014 Acute Right PT, Peroneal, Popliteal, Femoral, and common femoral DVT-->coumadoin  . GERD (gastroesophageal reflux disease)   . H/O hiatal hernia   . Hepatitis     a. 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  . Daily headache   . Chronic back pain   . Peptic ulcer disease   . On home oxygen therapy     a. added 04/2014 in setting of PE.  Marland Kitchen Acute lower GI bleeding     a. admitted to [facility] 04/11/2013;  b. 04/2014 EGD: 1.  gastric erosions, mild prox gastritis and bulbar duodenitis->Protonix.  . Pulmonary embolism     a. 04/2014 CTA: Bilat PE w/ right heart strain-->coumadin.  . Coronary artery disease   .  Shortness of breath dyspnea     Prior to Admission medications   Medication Sig Start Date End Date Taking? Authorizing Provider  carvedilol (COREG) 25 MG tablet Take 1 tablet (25 mg total) by mouth 2 (two) times daily with a meal. 07/01/14  Yes Thayer Headings, MD  Cholecalciferol (VITAMIN D3 PO) Take 1,000 capsules by mouth 2 (two) times a week.    Yes Historical Provider, MD  furosemide (LASIX) 40 MG tablet Take 0.5 tablets (20 mg total) by mouth daily. 07/10/14  Yes Burtis Junes, NP  gabapentin (NEURONTIN) 600 MG tablet Take 600 mg by mouth daily.   Yes Historical Provider, MD  hydrocortisone (CORTEF) 20 MG tablet Take 20 mg by mouth 2 (two) times daily.    Yes Historical Provider, MD  lisinopril (PRINIVIL,ZESTRIL) 10 MG tablet Take 5 mg by mouth daily.   Yes Historical Provider, MD  metoCLOPramide (REGLAN) 5 MG tablet Take 5 mg by mouth daily.    Yes Historical Provider, MD  pantoprazole (PROTONIX) 40 MG tablet Take 1 tablet (40 mg total) by mouth daily. 05/13/14  Yes Donne Hazel, MD  potassium chloride (K-DUR,KLOR-CON) 10 MEQ tablet Take 1 tablet (10 mEq total) by mouth once. 07/10/14  Yes Burtis Junes, NP  PROAIR HFA 108 (90 BASE) MCG/ACT inhaler INHALE 2 PUFFS INTO THE LUNGS EVERY 6 (SIX) HOURS AS NEEDED FOR WHEEZING OR SHORTNESS OF BREATH. 08/05/14  Yes Collene Gobble, MD  thyroid (ARMOUR) 60 MG tablet Take 60 mg by mouth daily.    Yes Historical Provider, MD  traMADol (ULTRAM) 50 MG tablet Take 100 mg by mouth 3 (three) times daily.    Yes Historical Provider, MD  traZODone (DESYREL) 100 MG tablet Take 100 mg by mouth every  evening.  04/16/14  Yes Historical Provider, MD  warfarin (COUMADIN) 3 MG tablet Take as directed by coumadin clinic Patient taking differently: Take 3-4.5 mg by mouth daily at 6 PM. Take as directed by coumadin clinic 3 mg Tues, Thurs, Sat  :   All other days is 4.5 mg 06/04/14  Yes Collene Gobble, MD      No Known Allergies  History   Social History  .  Marital Status: Married    Spouse Name: N/A  . Number of Children: N/A  . Years of Education: N/A   Occupational History  . retired    Social History Main Topics  . Smoking status: Never Smoker   . Smokeless tobacco: Never Used  . Alcohol Use: No  . Drug Use: No  . Sexual Activity: Not Currently   Other Topics Concern  . Not on file   Social History Narrative    Family History  Problem Relation Age of Onset  . Clotting disorder Mother   . Clotting disorder Brother   . Clotting disorder Brother   . Clotting disorder Brother   . Clotting disorder Other     Niece.    PHYSICAL EXAM: Filed Vitals:   08/20/14 1617  BP: 153/83  Pulse:   Temp:   Resp:    General: Well appearing. No respiratory difficulty. Hoarse voice HEENT: normal Neck: supple. no JVD. Carotids 2+ bilat; no bruits. No lymphadenopathy or thryomegaly appreciated. Cor: PMI nondisplaced. Regular rate & rhythm. +s4 no murmur Lungs: clear Abdomen: obese soft, nontender, nondistended. No hepatosplenomegaly. No bruits or masses. Good bowel sounds. Extremities: no cyanosis, clubbing, rash, edema Neuro: alert & oriented x 3, cranial nerves grossly intact. moves all 4 extremities w/o difficulty. Affect pleasant.  ECG: pending  Results for orders placed or performed during the hospital encounter of 08/20/14 (from the past 24 hour(s))  Basic metabolic panel     Status: Abnormal   Collection Time: 08/20/14  6:53 AM  Result Value Ref Range   Sodium 140 135 - 145 mmol/L   Potassium 3.8 3.5 - 5.1 mmol/L   Chloride 106 101 - 111 mmol/L   CO2 24 22 - 32 mmol/L   Glucose, Bld 102 (H) 65 - 99 mg/dL   BUN 25 (H) 6 - 20 mg/dL   Creatinine, Ser 1.80 (H) 0.61 - 1.24 mg/dL   Calcium 8.9 8.9 - 10.3 mg/dL   GFR calc non Af Amer 36 (L) >60 mL/min   GFR calc Af Amer 41 (L) >60 mL/min   Anion gap 10 5 - 15  CBC     Status: Abnormal   Collection Time: 08/20/14  6:53 AM  Result Value Ref Range   WBC 10.6 (H) 4.0 - 10.5  K/uL   RBC 3.92 (L) 4.22 - 5.81 MIL/uL   Hemoglobin 11.3 (L) 13.0 - 17.0 g/dL   HCT 35.4 (L) 39.0 - 52.0 %   MCV 90.3 78.0 - 100.0 fL   MCH 28.8 26.0 - 34.0 pg   MCHC 31.9 30.0 - 36.0 g/dL   RDW 15.8 (H) 11.5 - 15.5 %   Platelets 276 150 - 400 K/uL  Protime-INR     Status: Abnormal   Collection Time: 08/20/14  6:53 AM  Result Value Ref Range   Prothrombin Time 16.4 (H) 11.6 - 15.2 seconds   INR 1.31 0.00 - 1.49  I-STAT 3, arterial blood gas (G3+)     Status: Abnormal   Collection Time: 08/20/14  8:39 AM  Result Value Ref Range   pH, Arterial 7.415 7.350 - 7.450   pCO2 arterial 34.2 (L) 35.0 - 45.0 mmHg   pO2, Arterial 65.0 (L) 80.0 - 100.0 mmHg   Bicarbonate 21.9 20.0 - 24.0 mEq/L   TCO2 23 0 - 100 mmol/L   O2 Saturation 93.0 %   Acid-base deficit 2.0 0.0 - 2.0 mmol/L   Sample type ARTERIAL   I-STAT 3, venous blood gas (G3P V)     Status: Abnormal   Collection Time: 08/20/14  8:49 AM  Result Value Ref Range   pH, Ven 7.397 (H) 7.250 - 7.300   pCO2, Ven 38.9 (L) 45.0 - 50.0 mmHg   pO2, Ven 39.0 30.0 - 45.0 mmHg   Bicarbonate 23.9 20.0 - 24.0 mEq/L   TCO2 25 0 - 100 mmol/L   O2 Saturation 74.0 %   Acid-base deficit 1.0 0.0 - 2.0 mmol/L   Sample type VENOUS    Comment NOTIFIED PHYSICIAN   I-STAT 3, venous blood gas (G3P V)     Status: Abnormal   Collection Time: 08/20/14  8:53 AM  Result Value Ref Range   pH, Ven 7.387 (H) 7.250 - 7.300   pCO2, Ven 40.0 (L) 45.0 - 50.0 mmHg   pO2, Ven 38.0 30.0 - 45.0 mmHg   Bicarbonate 24.0 20.0 - 24.0 mEq/L   TCO2 25 0 - 100 mmol/L   O2 Saturation 71.0 %   Acid-base deficit 1.0 0.0 - 2.0 mmol/L   Sample type VENOUS    Comment NOTIFIED PHYSICIAN   I-STAT 3, venous blood gas (G3P V)     Status: Abnormal   Collection Time: 08/20/14  9:16 AM  Result Value Ref Range   pH, Ven 7.406 (H) 7.250 - 7.300   pCO2, Ven 42.2 (L) 45.0 - 50.0 mmHg   pO2, Ven 36.0 30.0 - 45.0 mmHg   Bicarbonate 26.5 (H) 20.0 - 24.0 mEq/L   TCO2 28 0 - 100 mmol/L    O2 Saturation 68.0 %   Acid-Base Excess 2.0 0.0 - 2.0 mmol/L   Sample type VENOUS    Comment NOTIFIED PHYSICIAN   I-STAT 3, venous blood gas (G3P V)     Status: Abnormal   Collection Time: 08/20/14  9:18 AM  Result Value Ref Range   pH, Ven 7.407 (H) 7.250 - 7.300   pCO2, Ven 39.2 (L) 45.0 - 50.0 mmHg   pO2, Ven 36.0 30.0 - 45.0 mmHg   Bicarbonate 24.6 (H) 20.0 - 24.0 mEq/L   TCO2 26 0 - 100 mmol/L   O2 Saturation 69.0 %   Sample type VENOUS    Comment NOTIFIED PHYSICIAN    No results found.   ASSESSMENT: 1. 2v CAD 2. Chronic dyspnea 3. Recent PE 4/16  4. CKD, stage 3  PLAN/DISCUSSION:  Cath results reviewed with Drs Acie Fredrickson and Irish Lack. Given severe limiting dyspnea, will plan PCI of R PDA +/- LAD and see if this help with symptoms. He does have recent PE 3 months ago and remains on coumadin so will need to consider appropriate anticoagulation approach. Recent chest CT shows complete resolution of PE so may be able to stop coumadin in favor of DAPT only or could consider coumadin/Plavix only. Will leave to discretion of interventional team. Hydrate gently overnight. Check BMET in am.   Bensimhon, Daniel,MD 5:51 PM

## 2014-08-20 NOTE — Interval H&P Note (Signed)
History and Physical Interval Note:  08/20/2014 8:27 AM  [name]  has presented today for surgery, with the diagnosis of hp  The various methods of treatment have been discussed with the patient and family. After consideration of risks, benefits and other options for treatment, the patient has consented to  Procedure(s): Right/Left Heart Cath and Coronary Angiography (N/A) and possible coronary angioplasty as a surgical intervention .  The patient's history has been reviewed, patient examined, no change in status, stable for surgery.  I have reviewed the patient's chart and labs.  Questions were answered to the patient's satisfaction.     [name], Quillian Quince

## 2014-08-20 NOTE — H&P (View-Only) (Signed)
Patient ID: [name], male   DOB: 02-06-1940, 74 y.o.   MRN: [id number]  Referring Physician: [name] Primary Care: Primary Cardiologist:  [name] HF: Kailana Benninger  HPI: [name] is a 74 y.o. with hx of obesity, chronic SOB, diastolic CHF, CKD stage 3 (baseline cr 1.9-2.0), HTN, Addison disease, submassive bilateral PE in 4/16 referred by [name] for persistent pulmonary HTN.  He has long h/o chronic dyspnea. Has been followed by Drs. [name] and [name]. Had CT scan in 2010 which was normal. Echo with EF 40-45%. (previously noted to be 30-35%). Echo repeated in 2012 and EF normalized.   In 2015 had superficial thrombophlebitis. Started on Xarelto and had GI bleed.   He was admitted to hospital on 05/10/14 for worsening SOB and was found to have submassive PEs. He did not meet criteria for TPA.  He was placed on heparin drip and transitioned to coumadin prior to d/c.   Echo with EF 55-60%. RV noted to be normal. In 7/14 normal Myoview.   Has had persistent dyspnea. No SOB with just minimal activity like walking to mailbox. F/u CT scan of chest in 5/16 was normal except for small pulmonary nodule and mild calcification of LAD. No residual PE. Non-smoker. Wife says he snores just a little bit. No witnessed apnea. Just mild LE edema. No orthopnea or PND. Has gained 20 pounds in about 6 months. Nonsmoker.   He presents today to establish prior to a [facility] with Dr Haroldine Laws on 08/21/14.  PFTs  5/15 FEV1 2.7L FVC   3.88 DLCO 60%  CPX 07/22/14 FVC 3.75, FEV1 2.37, FEV1/FVC 63 BP rest: 124/70 BP peak: 216/92 Peak VO2: 16.8 (76% predicted peak VO2) - when corrected to ideal weight the peak VO2 is 21.2 ml/kg (ibw)/min (81% of the ibw-adjusted predicted).  VE/VCO2 slope: 37 OUES: 1.8 Peak RER: 1.11 Ventilatory Threshold: 9.2 (41.6% predicted measured peak VO2) VE/MVV: 68% PETCO2 at peak: 31 O2pulse: 12  (80% predicted O2pulse)  Past Medical History  Diagnosis Date  .  Chronic diastolic CHF (congestive heart failure)     a. His initial ejection fraction was between 30 and 35%;  b. 04/2013 Echo: EF 50-55% Gr1 DD, mild-mod MR;  c. 04/2014 Echo: EF 60-65%, Gr 1 DD, triv TR.  Marland Kitchen Thyroid disease   . Pulmonary HTN   . Hypertension   . Addison's disease   . Hypogonadism male   . CKD (chronic kidney disease), stage III   . History of aortic insufficiency   . Hx of cardiovascular stress test     a. ETT-Myoview 7/14:  Low risk, inferior defect consistent with thinning, no ischemia, normal wall motion, EF 54%  . DVT (deep venous thrombosis)     a. 02/2013 Bilat LE-->Xarelto later d/c'd 2/2 GIB;  b. 04/2014 Acute Right PT, Peroneal, Popliteal, Femoral, and common femoral DVT-->coumadoin  . GERD (gastroesophageal reflux disease)   . H/O hiatal hernia   . Hepatitis     a. 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  . Daily headache   . Chronic back pain   . Peptic ulcer disease   . On home oxygen therapy     a. added 04/2014 in setting of PE.  Marland Kitchen Acute lower GI bleeding     a. admitted to [facility] 04/11/2013;  b. 04/2014 EGD: 1.  gastric erosions, mild prox gastritis and bulbar duodenitis->Protonix.  . Pulmonary embolism     a. 04/2014 CTA: Bilat PE w/ right heart strain-->coumadin.    Current  Outpatient Prescriptions  Medication Sig Dispense Refill  . carvedilol (COREG) 25 MG tablet Take 1 tablet (25 mg total) by mouth 2 (two) times daily with a meal. 180 tablet 1  . Cholecalciferol (VITAMIN D3 PO) Take 1,000 capsules by mouth daily.     . furosemide (LASIX) 40 MG tablet Take 0.5 tablets (20 mg total) by mouth daily. 180 tablet 1  . gabapentin (NEURONTIN) 600 MG tablet Take 600 mg by mouth daily.    . hydrocortisone (CORTEF) 20 MG tablet Take 20 mg by mouth 2 (two) times daily. Pt take 1/2 tab in QAM and 1 tab in QPM    . lisinopril (PRINIVIL,ZESTRIL) 10 MG tablet Take 5 mg by mouth daily.    . metoCLOPramide (REGLAN) 5 MG tablet Take 5 mg by mouth daily.     . pantoprazole (PROTONIX) 40 MG tablet  Take 1 tablet (40 mg total) by mouth daily. 30 tablet 0  . potassium chloride (K-DUR,KLOR-CON) 10 MEQ tablet Take 1 tablet (10 mEq total) by mouth once. 180 tablet 1  . PROAIR HFA 108 (90 BASE) MCG/ACT inhaler INHALE 2 PUFFS INTO THE LUNGS EVERY 6 (SIX) HOURS AS NEEDED FOR WHEEZING OR SHORTNESS OF BREATH. 8.5 Inhaler 2  . promethazine (PHENERGAN) 25 MG tablet Take 25 mg by mouth every 6 (six) hours as needed.  0  . thyroid (ARMOUR) 60 MG tablet Take 60 mg by mouth daily.     . traMADol (ULTRAM) 50 MG tablet Take 50 mg by mouth 3 (three) times daily.     . traZODone (DESYREL) 100 MG tablet Take 50-100 mg by mouth every evening.   2  . warfarin (COUMADIN) 3 MG tablet Take as directed by coumadin clinic 30 tablet 0   No current facility-administered medications for this encounter.    No Known Allergies    History   Social History  . Marital Status: Married    Spouse Name: N/A  . Number of Children: N/A  . Years of Education: N/A   Occupational History  . retired    Social History Main Topics  . Smoking status: Never Smoker   . Smokeless tobacco: Never Used  . Alcohol Use: No  . Drug Use: No  . Sexual Activity: Not Currently   Other Topics Concern  . Not on file   Social History Narrative      Family History  Problem Relation Age of Onset  . Clotting disorder Mother   . Clotting disorder Brother   . Clotting disorder Brother   . Clotting disorder Brother   . Clotting disorder Other     Niece.    Filed Vitals:   08/16/14 1034  BP: 114/70  Pulse: 77  Height: 5\' 10"  (1.778 m)  Weight: 218 lb 12.8 oz (99.247 kg)  SpO2: 100%    PHYSICAL EXAM: General:  Well appearing. No respiratory difficulty. Hoarse voice HEENT: normal Neck: supple. no JVD. Carotids 2+ bilat; no bruits. No lymphadenopathy or thryomegaly appreciated. Cor: PMI nondisplaced. Regular rate & rhythm. +s4 no murmur Lungs: clear Abdomen: obese soft, nontender, nondistended. No hepatosplenomegaly. No  bruits or masses. Good bowel sounds. Extremities: no cyanosis, clubbing, rash, edema Neuro: alert & oriented x 3, cranial nerves grossly intact. moves all 4 extremities w/o difficulty. Affect pleasant.  ECG: 05/10/14 NSR 88 normal axis. No ST-T wave abnormalities.     ASSESSMENT & PLAN:  1. Chronic dyspnea 2. Submassive PE 4/16    --f/u CT scan 5/16 no residual clot 3.  Obesity 4. Mild obstructive airway disease 5. CKD, stage 3  Complex situation. I have reviewed all of his studies closely as described in HPI. He has chronic dyspnea which has gotten much worse over the past 6 months to 1 year. This is complicated by recent submassive PE which have resolved on follow CT scan. In looking at his CPX testing, his functional capacit is just mildly decreased and when corrected for normal body actually comes up into the normal range. That said his Ve/VCo2 slope is elevated and suggestive of higher filling pressures during exercise. I suspect his dyspnea is multifactorial but that his weight may be playing a significant role but there is also evidence of COPD and diastolic HF.   I have recommended a low-carb diet for weight loss and we will proceed with L/R cath to further evaluate. We discussed risks of contrast nephropathy. He will also need a sleep study at some point and possible VQ to exclude chronic PE.   Total time spent 45 minutes. Over half that time spent discussing above.   Denali Becvar,MD 11:43 AM

## 2014-08-20 NOTE — Progress Notes (Signed)
  Spoke with [name] who spoke with interventional team. Would like to admit and hydrate overnight for PCI R PDA and possible LAD in am, if creatinine stable.   Bensimhon, Daniel,MD 9:42 AM

## 2014-08-20 NOTE — Progress Notes (Signed)
Site area: RFA/RFV Site Prior to Removal:  Level 0 Pressure Applied For:69min Manual:  yes  Patient Status During Pull:  stable Post Pull Site:  Level 0 Post Pull Instructions Given:  yes Post Pull Pulses Present: palpable Dressing Applied:  clear Bedrest begins @ 1030 1430 Comments:

## 2014-08-21 ENCOUNTER — Encounter (HOSPITAL_COMMUNITY): Payer: Self-pay | Admitting: Cardiovascular Disease

## 2014-08-21 ENCOUNTER — Encounter (HOSPITAL_COMMUNITY)
Admission: RE | Disposition: A | Discharge status: Home / Self Care | Payer: BLUE CROSS/BLUE SHIELD | Source: Ambulatory Visit | Attending: Cardiovascular Disease

## 2014-08-21 DIAGNOSIS — N289 Disorder of kidney and ureter, unspecified: Secondary | ICD-10-CM | POA: Diagnosis not present

## 2014-08-21 DIAGNOSIS — E079 Disorder of thyroid, unspecified: Secondary | ICD-10-CM | POA: Diagnosis present

## 2014-08-21 DIAGNOSIS — Z9981 Dependence on supplemental oxygen: Secondary | ICD-10-CM | POA: Diagnosis not present

## 2014-08-21 DIAGNOSIS — I129 Hypertensive chronic kidney disease with stage 1 through stage 4 chronic kidney disease, or unspecified chronic kidney disease: Secondary | ICD-10-CM | POA: Diagnosis present

## 2014-08-21 DIAGNOSIS — I200 Unstable angina: Secondary | ICD-10-CM | POA: Diagnosis not present

## 2014-08-21 DIAGNOSIS — E271 Primary adrenocortical insufficiency: Secondary | ICD-10-CM | POA: Diagnosis present

## 2014-08-21 DIAGNOSIS — Z86711 Personal history of pulmonary embolism: Secondary | ICD-10-CM | POA: Diagnosis not present

## 2014-08-21 DIAGNOSIS — I5032 Chronic diastolic (congestive) heart failure: Secondary | ICD-10-CM | POA: Diagnosis present

## 2014-08-21 DIAGNOSIS — N183 Chronic kidney disease, stage 3 (moderate): Secondary | ICD-10-CM | POA: Diagnosis present

## 2014-08-21 DIAGNOSIS — Z6831 Body mass index (BMI) 31.0-31.9, adult: Secondary | ICD-10-CM | POA: Diagnosis not present

## 2014-08-21 DIAGNOSIS — K219 Gastro-esophageal reflux disease without esophagitis: Secondary | ICD-10-CM | POA: Diagnosis present

## 2014-08-21 DIAGNOSIS — Z7901 Long term (current) use of anticoagulants: Secondary | ICD-10-CM | POA: Diagnosis not present

## 2014-08-21 DIAGNOSIS — Z8711 Personal history of peptic ulcer disease: Secondary | ICD-10-CM | POA: Diagnosis not present

## 2014-08-21 DIAGNOSIS — J449 Chronic obstructive pulmonary disease, unspecified: Secondary | ICD-10-CM | POA: Diagnosis present

## 2014-08-21 DIAGNOSIS — R0600 Dyspnea, unspecified: Secondary | ICD-10-CM | POA: Diagnosis present

## 2014-08-21 DIAGNOSIS — Z86718 Personal history of other venous thrombosis and embolism: Secondary | ICD-10-CM | POA: Diagnosis not present

## 2014-08-21 DIAGNOSIS — I272 Other secondary pulmonary hypertension: Secondary | ICD-10-CM | POA: Diagnosis present

## 2014-08-21 DIAGNOSIS — Z79899 Other long term (current) drug therapy: Secondary | ICD-10-CM | POA: Diagnosis not present

## 2014-08-21 DIAGNOSIS — E669 Obesity, unspecified: Secondary | ICD-10-CM | POA: Diagnosis present

## 2014-08-21 DIAGNOSIS — I2542 Coronary artery dissection: Secondary | ICD-10-CM | POA: Diagnosis present

## 2014-08-21 DIAGNOSIS — I2510 Atherosclerotic heart disease of native coronary artery without angina pectoris: Secondary | ICD-10-CM | POA: Diagnosis present

## 2014-08-21 HISTORY — PX: CARDIAC CATHETERIZATION: SHX172

## 2014-08-21 LAB — CBC
HCT: 33.5 % — ABNORMAL LOW (ref 39.0–52.0)
Hemoglobin: 10.7 g/dL — ABNORMAL LOW (ref 13.0–17.0)
MCH: 28.6 pg (ref 26.0–34.0)
MCHC: 31.9 g/dL (ref 30.0–36.0)
MCV: 89.6 fL (ref 78.0–100.0)
PLATELETS: 265 10*3/uL (ref 150–400)
RBC: 3.74 MIL/uL — ABNORMAL LOW (ref 4.22–5.81)
RDW: 16 % — ABNORMAL HIGH (ref 11.5–15.5)
WBC: 8.4 10*3/uL (ref 4.0–10.5)

## 2014-08-21 LAB — BASIC METABOLIC PANEL
Anion gap: 8 (ref 5–15)
BUN: 17 mg/dL (ref 6–20)
CO2: 24 mmol/L (ref 22–32)
Calcium: 8.1 mg/dL — ABNORMAL LOW (ref 8.9–10.3)
Chloride: 107 mmol/L (ref 101–111)
Creatinine, Ser: 1.42 mg/dL — ABNORMAL HIGH (ref 0.61–1.24)
GFR calc Af Amer: 55 mL/min — ABNORMAL LOW (ref >60)
GFR, EST NON AFRICAN AMERICAN: 47 mL/min — AB (ref >60)
Glucose, Bld: 86 mg/dL (ref 65–99)
POTASSIUM: 4 mmol/L (ref 3.5–5.1)
SODIUM: 139 mmol/L (ref 135–145)

## 2014-08-21 LAB — PROTIME-INR
INR: 1.24 (ref 0.00–1.49)
PROTHROMBIN TIME: 15.8 s — AB (ref 11.6–15.2)

## 2014-08-21 LAB — POCT ACTIVATED CLOTTING TIME: Activated Clotting Time: 546 s

## 2014-08-21 SURGERY — CORONARY STENT INTERVENTION

## 2014-08-21 MED ORDER — FENTANYL CITRATE (PF) 100 MCG/2ML IJ SOLN
INTRAMUSCULAR | Status: AC
  Filled 2014-08-21: qty 2

## 2014-08-21 MED ORDER — ALBUTEROL SULFATE (2.5 MG/3ML) 0.083% IN NEBU: 3.0000 mL | INHALATION_SOLUTION | Freq: Four times a day (QID) | RESPIRATORY_TRACT | Status: DC | PRN

## 2014-08-21 MED ORDER — ASPIRIN 81 MG PO CHEW
CHEWABLE_TABLET | ORAL | Status: AC
  Filled 2014-08-21: qty 4

## 2014-08-21 MED ORDER — SODIUM CHLORIDE 0.9 % IJ SOLN: 3.0000 mL | Freq: Two times a day (BID) | INTRAMUSCULAR | Status: DC

## 2014-08-21 MED ORDER — ASPIRIN 81 MG PO CHEW
CHEWABLE_TABLET | ORAL | Status: DC | PRN
  Administered 2014-08-21: 324 mg via ORAL

## 2014-08-21 MED ORDER — ISOSORBIDE MONONITRATE ER 30 MG PO TB24
30.0000 mg | ORAL_TABLET | Freq: Every day | ORAL | Status: DC
  Administered 2014-08-21 – 2014-08-22 (×2): 30 mg via ORAL
  Filled 2014-08-21 (×2): qty 1

## 2014-08-21 MED ORDER — ASPIRIN 81 MG PO CHEW: 81.0000 mg | CHEWABLE_TABLET | Freq: Every day | ORAL | Status: DC

## 2014-08-21 MED ORDER — NITROGLYCERIN 1 MG/10 ML FOR IR/CATH LAB
INTRA_ARTERIAL | Status: DC | PRN
  Administered 2014-08-21: 200 ug
  Administered 2014-08-21 (×4): 200 ug via INTRACORONARY

## 2014-08-21 MED ORDER — SODIUM CHLORIDE 0.9 % IV SOLN: 250.0000 mL | INTRAVENOUS | Status: DC | PRN

## 2014-08-21 MED ORDER — HEPARIN (PORCINE) IN NACL 2-0.9 UNIT/ML-% IJ SOLN
INTRAMUSCULAR | Status: AC
  Filled 2014-08-21: qty 1000

## 2014-08-21 MED ORDER — SODIUM CHLORIDE 0.9 % IJ SOLN
3.0000 mL | Freq: Two times a day (BID) | INTRAMUSCULAR | Status: DC
  Administered 2014-08-21: 16:00:00 3 mL via INTRAVENOUS

## 2014-08-21 MED ORDER — MAGNESIUM HYDROXIDE 400 MG/5ML PO SUSP: 30.0000 mL | Freq: Every day | ORAL | Status: DC | PRN

## 2014-08-21 MED ORDER — LIDOCAINE HCL (PF) 1 % IJ SOLN
INTRAMUSCULAR | Status: AC
  Filled 2014-08-21: qty 30

## 2014-08-21 MED ORDER — IOHEXOL 350 MG/ML SOLN
INTRAVENOUS | Status: DC | PRN
  Administered 2014-08-21: 275 mL via INTRAVENOUS

## 2014-08-21 MED ORDER — SODIUM CHLORIDE 0.9 % IJ SOLN
3.0000 mL | INTRAMUSCULAR | Status: DC | PRN
Start: 2014-08-21 — End: 2014-08-22

## 2014-08-21 MED ORDER — CLOPIDOGREL BISULFATE 300 MG PO TABS
ORAL_TABLET | ORAL | Status: AC
  Filled 2014-08-21: qty 2

## 2014-08-21 MED ORDER — CLOPIDOGREL BISULFATE 75 MG PO TABS
ORAL_TABLET | ORAL | Status: DC | PRN
  Administered 2014-08-21: 600 mg via ORAL

## 2014-08-21 MED ORDER — MIDAZOLAM HCL 2 MG/2ML IJ SOLN
INTRAMUSCULAR | Status: DC | PRN
  Administered 2014-08-21 (×2): 1 mg via INTRAVENOUS

## 2014-08-21 MED ORDER — FUROSEMIDE 20 MG PO TABS
20.0000 mg | ORAL_TABLET | Freq: Once | ORAL | Status: AC
  Administered 2014-08-21: 20 mg via ORAL
  Filled 2014-08-21: qty 1

## 2014-08-21 MED ORDER — BIVALIRUDIN 250 MG IV SOLR
INTRAVENOUS | Status: AC
  Filled 2014-08-21: qty 250

## 2014-08-21 MED ORDER — NITROGLYCERIN 1 MG/10 ML FOR IR/CATH LAB
INTRA_ARTERIAL | Status: AC
  Filled 2014-08-21: qty 10

## 2014-08-21 MED ORDER — SODIUM CHLORIDE 0.9 % IV SOLN
250.0000 mg | INTRAVENOUS | Status: DC | PRN
  Administered 2014-08-21 (×2): 1.75 mg/kg/h via INTRAVENOUS

## 2014-08-21 MED ORDER — SODIUM BICARBONATE BOLUS VIA INFUSION
INTRAVENOUS | Status: DC
  Filled 2014-08-21: qty 1

## 2014-08-21 MED ORDER — HEPARIN (PORCINE) IN NACL 100-0.45 UNIT/ML-% IJ SOLN: 1200.0000 [IU]/h | INTRAMUSCULAR | Status: DC

## 2014-08-21 MED ORDER — SODIUM BICARBONATE BOLUS VIA INFUSION
INTRAVENOUS | Status: AC
  Administered 2014-08-21: 290 meq via INTRAVENOUS

## 2014-08-21 MED ORDER — SODIUM CHLORIDE 0.9 % IV SOLN
0.2500 mg/kg/h | INTRAVENOUS | Status: DC
  Filled 2014-08-21: qty 250

## 2014-08-21 MED ORDER — FENTANYL CITRATE (PF) 100 MCG/2ML IJ SOLN
INTRAMUSCULAR | Status: DC | PRN
  Administered 2014-08-21 (×2): 50 ug via INTRAVENOUS

## 2014-08-21 MED ORDER — ADENOSINE 12 MG/4ML IV SOLN
16.0000 mL | INTRAVENOUS | Status: AC
  Administered 2014-08-21: 16 mL via INTRAVENOUS
  Filled 2014-08-21: qty 16

## 2014-08-21 MED ORDER — MIDAZOLAM HCL 2 MG/2ML IJ SOLN
INTRAMUSCULAR | Status: AC
Start: 2014-08-21 — End: 2014-08-21
  Filled 2014-08-21: qty 2

## 2014-08-21 MED ORDER — BIVALIRUDIN BOLUS VIA INFUSION - CUPID
INTRAVENOUS | Status: DC | PRN
  Administered 2014-08-21: 74.175 mg via INTRAVENOUS

## 2014-08-21 MED ORDER — WARFARIN - PHARMACIST DOSING INPATIENT: Freq: Every day | Status: DC

## 2014-08-21 MED ORDER — HEPARIN (PORCINE) IN NACL 100-0.45 UNIT/ML-% IJ SOLN
1200.0000 [IU]/h | INTRAMUSCULAR | Status: DC
  Administered 2014-08-22: 01:00:00 1200 [IU]/h via INTRAVENOUS
  Filled 2014-08-21: qty 250

## 2014-08-21 MED ORDER — SODIUM BICARBONATE 8.4 % IV SOLN
INTRAVENOUS | Status: AC
  Filled 2014-08-21: qty 1000

## 2014-08-21 MED ORDER — WARFARIN SODIUM 5 MG PO TABS
5.0000 mg | ORAL_TABLET | Freq: Once | ORAL | Status: AC
  Administered 2014-08-21: 18:00:00 5 mg via ORAL
  Filled 2014-08-21: qty 1

## 2014-08-21 MED ORDER — SODIUM CHLORIDE 0.9 % IJ SOLN: 3.0000 mL | INTRAMUSCULAR | Status: DC | PRN

## 2014-08-21 MED ORDER — CLOPIDOGREL BISULFATE 75 MG PO TABS
75.0000 mg | ORAL_TABLET | Freq: Every day | ORAL | Status: DC
  Administered 2014-08-22: 75 mg via ORAL
  Filled 2014-08-21: qty 1

## 2014-08-21 SURGICAL SUPPLY — 22 items
BALLN EUPHORA RX 1.5X20 (BALLOONS) ×3
BALLN TREK RX 2.5X8 (BALLOONS) ×3
BALLN ~~LOC~~ EUPHORA RX 3.0X15 (BALLOONS) ×3
BALLOON EUPHORA RX 1.5X20 (BALLOONS) IMPLANT
BALLOON TREK RX 2.5X8 (BALLOONS) IMPLANT
BALLOON ~~LOC~~ EUPHORA RX 3.0X15 (BALLOONS) IMPLANT
CATH MICROCATH NAVVUS (MICROCATHETER) ×1 IMPLANT
CATH VISTA GUIDE 6FR AR2 (CATHETERS) ×3 IMPLANT
CATH VISTA GUIDE 6FR JR4 (CATHETERS) ×2 IMPLANT
CATH VISTA GUIDE 6FR XBLAD3.5 (CATHETERS) ×2 IMPLANT
GLIDESHEATH SLEND SS 6F .021 (SHEATH) ×2 IMPLANT
KIT ENCORE 26 ADVANTAGE (KITS) ×3 IMPLANT
KIT HEART LEFT (KITS) ×3 IMPLANT
MICROCATHETER NAVVUS (MICROCATHETER) ×3
PACK CARDIAC CATHETERIZATION (CUSTOM PROCEDURE TRAY) ×3 IMPLANT
STENT RESOLUTE INTEG 2.75X14 (Permanent Stent) ×2 IMPLANT
STENT RESOLUTE INTEG 2.75X18 (Permanent Stent) ×2 IMPLANT
TRANSDUCER W/STOPCOCK (MISCELLANEOUS) ×3 IMPLANT
TUBING CIL FLEX 10 FLL-RA (TUBING) ×3 IMPLANT
WIRE INTUITION PROPEL ST 180CM (WIRE) ×2 IMPLANT
WIRE RUNTHROUGH .014X180CM (WIRE) ×2 IMPLANT
WIRE SAFE-T 1.5MM-J .035X260CM (WIRE) ×2 IMPLANT

## 2014-08-21 NOTE — Progress Notes (Signed)
TR BAND REMOVAL  LOCATION:    right radial  DEFLATED PER PROTOCOL:    Yes.    TIME BAND OFF / DRESSING APPLIED:    1630   SITE UPON ARRIVAL:    Level 0  SITE AFTER BAND REMOVAL:    Level 0  REVERSE ALLEN'S TEST:     positive  CIRCULATION SENSATION AND MOVEMENT:    Within Normal Limits   Yes.    COMMENTS:   Tolerated procedure well

## 2014-08-21 NOTE — Progress Notes (Addendum)
ANTICOAGULATION CONSULT NOTE - Initial Consult  Pharmacy Consult for heparin and warfarin Indication: Hx of PE, Coumadin PTA  No Known Allergies  Patient Measurements: Height: 5\' 10"  (177.8 cm) Weight: 218 lb 1.6 oz (98.93 kg) (a scale) IBW/kg (Calculated) : 73 Heparin Dosing Weight: 93.3 kg  Vital Signs: Temp: 98.5 F (36.9 C) (07/27 0515) Temp Source: Oral (07/27 0515) BP: 133/68 mmHg (07/27 1311) Pulse Rate: 64 (07/27 1311)  Labs:  Recent Labs  08/20/14 0653 08/21/14 0555  HGB 11.3* 10.7*  HCT 35.4* 33.5*  PLT 276 265  LABPROT 16.4* 15.8*  INR 1.31 1.24  CREATININE 1.80* 1.42*    Estimated Creatinine Clearance: 54.7 mL/min (by C-G formula based on Cr of 1.42).   Medical History: Past Medical History  Diagnosis Date  . Chronic diastolic CHF (congestive heart failure)     a. His initial ejection fraction was between 30 and 35%;  b. 04/2013 Echo: EF 50-55% Gr1 DD, mild-mod MR;  c. 04/2014 Echo: EF 60-65%, Gr 1 DD, triv TR.  Marland Kitchen Thyroid disease   . Pulmonary HTN   . Hypertension   . Addison's disease   . Hypogonadism male   . CKD (chronic kidney disease), stage III   . History of aortic insufficiency   . Hx of cardiovascular stress test     a. ETT-Myoview 7/14:  Low risk, inferior defect consistent with thinning, no ischemia, normal wall motion, EF 54%  . DVT (deep venous thrombosis)     a. 02/2013 Bilat LE-->Xarelto later d/c'd 2/2 GIB;  b. 04/2014 Acute Right PT, Peroneal, Popliteal, Femoral, and common femoral DVT-->coumadoin  . GERD (gastroesophageal reflux disease)   . H/O hiatal hernia   . Hepatitis     a. 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  . Daily headache   . Chronic back pain   . Peptic ulcer disease   . On home oxygen therapy     a. added 04/2014 in setting of PE.  Marland Kitchen Acute lower GI bleeding     a. admitted to [facility] 04/11/2013;  b. 04/2014 EGD: 1.  gastric erosions, mild prox gastritis and bulbar duodenitis->Protonix.  . Pulmonary embolism     a. 04/2014 CTA: Bilat PE w/  right heart strain-->coumadin.  . Coronary artery disease   . Shortness of breath dyspnea     Medications:  Scheduled:  . albuterol  3 mL Inhalation Q6H  . aspirin  81 mg Oral Daily  . carvedilol  25 mg Oral BID WC  . [START ON 08/22/2014] clopidogrel  75 mg Oral Q breakfast  . gabapentin  600 mg Oral Daily  . isosorbide mononitrate  30 mg Oral Daily  . lisinopril  5 mg Oral Daily  . metoCLOPramide  5 mg Oral Daily  . pantoprazole  40 mg Oral QAC breakfast  . sodium bicarbonate (CIN) infusion   Intravenous Pre-Cath  . sodium chloride  3 mL Intravenous Q12H  . sodium chloride  3 mL Intravenous Q12H  . sodium chloride  3 mL Intravenous Q12H  . thyroid  60 mg Oral Daily  . traZODone  100 mg Oral QPM  . warfarin  5 mg Oral ONCE-1800  . Warfarin - Pharmacist Dosing Inpatient   Does not apply q1800    Assessment: 74 YO male who presented for a cath procedure. Pharmacy consulted to restart Coumadin with heparin bridge s/p cath.. S/p diagnostic cath 7/26, s/p PCI +DES today. Sheath removed 12:10pm, TR band scheduled for removal at 4:45pm. Pharmacy consulted to  restart warfarin for hx PE 3 months ago this evening with heparin bridge to begin 8 hr post TR band removal. Stop ASA once INR therapeutic.  PTA warfarin regimen: 4.5 mg daily except 3.5 mg TuThSa INR 1.31 upon admission on 7/26 and 1.24 prior to cath procedure.   Goal of Therapy:  INR 2-3  HL 0.3 - 0.7 Monitor platelets by anticoagulation protocol: Yes   Plan:  - Warfarin 5 mg po x1 this evening - Start heparin 1200 units/hr 8 hours after TR band removal starting at 0100 - 0700 HL -  F/u INR, HL, CBC, renal function, s/sx bleeding -  D/c ASA once INR therapeutic per MD - Continue plavix once INR therapeutic per MD  Joya San, PharmD Clinical Pharmacist Pager # [phone] 08/21/2014 3:06 PM

## 2014-08-21 NOTE — Interval H&P Note (Signed)
Cath Lab Visit (complete for each Cath Lab visit)  Clinical Evaluation Leading to the Procedure:   ACS: No.  Non-ACS:    Anginal Classification: CCS III  Anti-ischemic medical therapy: Minimal Therapy (1 class of medications)  Non-Invasive Test Results: No non-invasive testing performed  Prior CABG: No previous CABG      History and Physical Interval Note:  08/21/2014 10:09 AM  [name]  has presented today for surgery, with the diagnosis of CAD  The various methods of treatment have been discussed with the patient and family. After consideration of risks, benefits and other options for treatment, the patient has consented to  Procedure(s): Coronary Stent Intervention (N/A) as a surgical intervention .  The patient's history has been reviewed, patient examined, no change in status, stable for surgery.  I have reviewed the patient's chart and labs.  Questions were answered to the patient's satisfaction.     Kathlyn [place]

## 2014-08-21 NOTE — Progress Notes (Signed)
Notified Pat that pt NaBicarb going at 290 bolus dose stated at 0930 on iv to Rt hand area, pt lost 2nd site.  Verbalized understanding.  Karie Kirks, Therapist, sports.

## 2014-08-22 ENCOUNTER — Encounter (HOSPITAL_COMMUNITY): Payer: Self-pay | Admitting: Physician Assistant

## 2014-08-22 DIAGNOSIS — N289 Disorder of kidney and ureter, unspecified: Secondary | ICD-10-CM

## 2014-08-22 DIAGNOSIS — Z7901 Long term (current) use of anticoagulants: Secondary | ICD-10-CM

## 2014-08-22 DIAGNOSIS — I200 Unstable angina: Secondary | ICD-10-CM

## 2014-08-22 LAB — CBC
HCT: 32.8 % — ABNORMAL LOW (ref 39.0–52.0)
Hemoglobin: 10.6 g/dL — ABNORMAL LOW (ref 13.0–17.0)
MCH: 28.7 pg (ref 26.0–34.0)
MCHC: 32.3 g/dL (ref 30.0–36.0)
MCV: 88.9 fL (ref 78.0–100.0)
PLATELETS: 237 10*3/uL (ref 150–400)
RBC: 3.69 MIL/uL — ABNORMAL LOW (ref 4.22–5.81)
RDW: 15.9 % — ABNORMAL HIGH (ref 11.5–15.5)
WBC: 10.2 10*3/uL (ref 4.0–10.5)

## 2014-08-22 LAB — PROTIME-INR
INR: 1.38 (ref 0.00–1.49)
Prothrombin Time: 17.1 seconds — ABNORMAL HIGH (ref 11.6–15.2)

## 2014-08-22 LAB — HEPARIN LEVEL (UNFRACTIONATED): Heparin Unfractionated: 0.4 IU/mL (ref 0.30–0.70)

## 2014-08-22 LAB — BASIC METABOLIC PANEL
Anion gap: 8 (ref 5–15)
BUN: 10 mg/dL (ref 6–20)
CALCIUM: 8.3 mg/dL — AB (ref 8.9–10.3)
CO2: 26 mmol/L (ref 22–32)
Chloride: 108 mmol/L (ref 101–111)
Creatinine, Ser: 1.39 mg/dL — ABNORMAL HIGH (ref 0.61–1.24)
GFR calc Af Amer: 56 mL/min — ABNORMAL LOW (ref >60)
GFR calc non Af Amer: 49 mL/min — ABNORMAL LOW (ref >60)
Glucose, Bld: 96 mg/dL (ref 65–99)
Potassium: 3.5 mmol/L (ref 3.5–5.1)
Sodium: 142 mmol/L (ref 135–145)

## 2014-08-22 MED ORDER — HYDROCORTISONE 20 MG PO TABS
20.0000 mg | ORAL_TABLET | Freq: Two times a day (BID) | ORAL | Status: DC
  Administered 2014-08-22: 11:00:00 20 mg via ORAL
  Filled 2014-08-22 (×2): qty 1

## 2014-08-22 MED ORDER — WARFARIN SODIUM 3 MG PO TABS
ORAL_TABLET | ORAL | Status: DC
Start: 2014-08-22 — End: 2015-01-13

## 2014-08-22 MED ORDER — POTASSIUM CHLORIDE CRYS ER 10 MEQ PO TBCR
10.0000 meq | EXTENDED_RELEASE_TABLET | Freq: Every day | ORAL | Status: DC
  Administered 2014-08-22: 12:00:00 10 meq via ORAL
  Filled 2014-08-22: qty 1

## 2014-08-22 MED ORDER — TRAMADOL HCL 50 MG PO TABS
50.0000 mg | ORAL_TABLET | Freq: Once | ORAL | Status: AC
  Administered 2014-08-22: 50 mg via ORAL
  Filled 2014-08-22: qty 1

## 2014-08-22 MED ORDER — ASPIRIN EC 81 MG PO TBEC
81.0000 mg | DELAYED_RELEASE_TABLET | Freq: Every day | ORAL | Status: DC
  Administered 2014-08-22: 81 mg via ORAL
  Filled 2014-08-22: qty 1

## 2014-08-22 MED ORDER — ASPIRIN 81 MG PO TBEC: 81.0000 mg | DELAYED_RELEASE_TABLET | Freq: Every day | ORAL | Status: DC

## 2014-08-22 MED ORDER — ISOSORBIDE MONONITRATE ER 30 MG PO TB24: 30.0000 mg | ORAL_TABLET | Freq: Every day | ORAL | Status: DC

## 2014-08-22 MED ORDER — FUROSEMIDE 20 MG PO TABS
20.0000 mg | ORAL_TABLET | Freq: Every day | ORAL | Status: DC
  Administered 2014-08-22: 20 mg via ORAL
  Filled 2014-08-22: qty 1

## 2014-08-22 MED ORDER — POTASSIUM CHLORIDE CRYS ER 10 MEQ PO TBCR: 10.0000 meq | EXTENDED_RELEASE_TABLET | Freq: Every day | ORAL | Status: DC

## 2014-08-22 MED ORDER — CLOPIDOGREL BISULFATE 75 MG PO TABS
75.0000 mg | ORAL_TABLET | Freq: Every day | ORAL | Status: DC
Start: 2014-08-22 — End: 2015-04-18

## 2014-08-22 MED ORDER — NITROGLYCERIN 0.4 MG SL SUBL: 0.4000 mg | SUBLINGUAL_TABLET | SUBLINGUAL | Status: DC | PRN

## 2014-08-22 MED FILL — Clopidogrel Bisulfate Tab 300 MG (Base Equiv): ORAL | Qty: 2 | Status: AC

## 2014-08-22 NOTE — Progress Notes (Signed)
Pt given all discharge instructions and questions were answered. Pt aware that asa is to be stopped once his inr is therapeutic. All belongings with pt and pt is not experiencing any chest pain or sob at discharge.

## 2014-08-22 NOTE — Progress Notes (Signed)
CARDIAC REHAB PHASE I   PRE:  Rate/Rhythm: 98 SR    BP: sitting 127/67    SaO2: 88 RA  MODE:  Ambulation: 500 ft   POST:  Rate/Rhythm: 108 ST    BP: sitting 124/90     SaO2: 95 RA  Pt just d/c'd O2 upon my arrival. Somewhat SOB coming out of BR. Able to walk 500 ft with minimal SOB. Sts he feels much improved and that he would have been panting after a few feet before PCI. SaO2 difficult to register in hall but 95 RA after walking with warming fingers. To recliner. Ed completed and pt receptive. Encouraged slow increase in walking and CRPII which pt is interested in. Will send referral to [facility]. Sts his wife manages his meds. I reinforced importance of Plavix.  [phone]   Josephina Shih [place] CES, ACSM 08/22/2014 10:19 AM

## 2014-08-22 NOTE — Discharge Summary (Signed)
Discharge Summary   Patient ID: [name] MRN: [id number], DOB/AGE: 1940-02-10 74 y.o. Admit date: 08/20/2014 D/C date:     08/22/2014  Primary Care Provider:  Melinda Crutch, MD Primary Cardiologist: [name]/Bensimhon  Primary Discharge Diagnoses:  1. Dyspnea likely multifactorial but may be due in part to newly diagnosed CAD  - s/p difficult PCI - had pressure wire analysis of LAD s/p DES in mid segment c/b wire-induced dissection in the distal segment of the LAD which was treated with repeated balloon inflations. Also had DES to rPDA - again had either spasm distal to the stent placement or an edge dissection but the vessel was small in that area and given the lack of symptoms and EKG changes, Dr. Fletcher Anon decided to abort the procedure 2. 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 CKD stage III 4. Addison's disease 5. Chronic diastolic CHF 6. Obesity Body mass index is 31.44 kg/(m^2). 7. Mild anemia  PMH:  Past Medical History  Diagnosis Date  . Chronic diastolic CHF (congestive heart failure)     a. His initial ejection fraction was between 30 and 35%;  b. 04/2013 Echo: EF 50-55% Gr1 DD, mild-mod MR;  c. 04/2014 Echo: EF 60-65%, Gr 1 DD, triv TR.  Marland Kitchen Thyroid disease   . Pulmonary HTN   . Hypertension   . Addison's disease   . Hypogonadism male   . CKD (chronic kidney disease), stage III   . History of aortic insufficiency   . Hx of cardiovascular stress test     a. ETT-Myoview 7/14:  Low risk, inferior defect consistent with thinning, no ischemia, normal wall motion, EF 54%  . DVT (deep venous thrombosis)     a. 02/2013 superficial thrombophlebitis --> Xarelto later d/c'd 2/2 GIB;  b. 04/2014 Acute Right PT, Peroneal, Popliteal, Femoral, and common femoral DVT-->coumadin  . GERD (gastroesophageal reflux disease)   . H/O hiatal hernia   . Hepatitis     a. 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  . Daily  headache   . Chronic back pain   . Peptic ulcer disease   . On home oxygen therapy     a. added 04/2014 in setting of PE.  Marland Kitchen Acute lower GI bleeding     a. [facility] 04/11/2013;  b. 04/2014 EGD: 1.  gastric erosions, mild prox gastritis and bulbar duodenitis->Protonix.  . Pulmonary embolism     a. 04/2014 CTA: Bilat submassive PE ->coumadin.  . Coronary artery disease     a. 07/2014 - s/p difficult PCI - had pressure wire analysis of LAD s/p DES in mid segment c/b wire-induced dissection in the distal segment of the LAD which was treated with repeated balloon inflations. Also had DES to rPDA - again had either spasm distal to the stent placement or an edge dissection but the vessel was small in that area; procedure aborted.  . Obesity   . Anemia      Hospital Course: [name] is a 74 y.o. with hx of obesity, chronic SOB, diastolic CHF, CKD stage 3 (baseline cr 1.9-2.0), HTN, Addison disease, submassive bilateral PE in 4/16 referred by [name] for persistent pulmonary HTN.  He has long h/o chronic dyspnea. Has been followed by Drs. [name] and [name]. Had CT scan in 2010 which was normal. Echo with EF 40-45%. (previously noted to be 30-35%). Echo repeated in 2012 and EF normalized.  In 2015 had superficial thrombophlebitis. Started on Xarelto and had GI bleed.   He was admitted to hospital on 05/10/14 for worsening SOB and was found to have submassive PEs. He did not meet criteria for TPA. He was placed on heparin drip and transitioned to coumadin prior to d/c. Echo with EF 55-60%. RV noted to be normal. In 7/14 normal Myoview. Has had persistent dyspnea. No SOB with just minimal activity like walking to mailbox. F/u CT scan of chest in 5/16 was normal except for small pulmonary nodule and mild calcification of LAD. No residual PE.   Dr. Haroldine Laws recommended outpatient [facility] for further eval. Coumadin was held without bridging in prep for for this. He underwent diagnostic  cath on 08/20/14 showing 2V CAD, normal pulmonary pressures with elevated cardiac output but no evidence of intracardiac shunt. As above he'd previously had normal EF by echo. Dr. Haroldine Laws suspected some of his dyspnea may be related to his body habitus, but also felt he required PCI for this CAD. The patient was hydrated. Lasix was held. The following day (yesterday 08/21/14) he underwent planned PCI. This was a difficult procedure. He had pressure wire analysis of LAD with subsequent DES placement in mid segment c/b wire-induced dissection in the distal segment of the LAD which was treated with repeated balloon inflations. Also had DES to rPDA - again had either spasm distal to the stent placement or an edge dissection but the vessel was small in that area. Given the lack of symptoms and EKG changes, Dr. Fletcher Anon decided to abort the procedure. Imdur was started. The interventional team recommended to resume Coumadin last night, start Plavix, and continue ASA, with plans to stop ASA when INR therapeutic. I discussed with pharmacy and they recommended to continue home dose of Coumadin with INR check on Monday which has been arranged. He was not felt to require bridging going out of the hospital. Creatinine was stable post-cath. The patient initially was felt to have borderline low O2 sats this AM but when rechecked, these were normal, including with ambulation. The patient feels well today and is very eager to be discharged. Dr. Irish Lack has seen and examined the patient today and feels he is stable for discharge.    Discharge Vitals: Blood pressure 107/62, pulse 89, temperature 98 F (36.7 C), temperature source Oral, resp. rate 18, height 5\' 10"  (1.778 m), weight 219 lb 2.2 oz (99.4 kg), SpO2 92 %.  Labs: Lab Results  Component Value Date   WBC 10.2 08/22/2014   HGB 10.6* 08/22/2014   HCT 32.8* 08/22/2014   MCV 88.9 08/22/2014   PLT 237 08/22/2014     Recent Labs Lab 08/22/14 0327  NA 142  K 3.5    CL 108  CO2 26  BUN 10  CREATININE 1.39*  CALCIUM 8.3*  GLUCOSE 96    Lab Results  Component Value Date   CHOL 180 05/05/2010   HDL 31.30* 05/05/2010   LDLCALC 123* 05/05/2010   TRIG 128.0 05/05/2010     Diagnostic Studies/Procedures   Cardiac catheterization this admission, please see full report and above for summary.   Discharge Medications   Current Discharge Medication List    START taking these medications   Details  aspirin EC 81 MG EC tablet Take 1 tablet (81 mg total) by mouth daily. Stop aspirin as soon as Coumadin INR level is therapeutic (between 2-3).    clopidogrel (PLAVIX) 75 MG tablet Take 1 tablet (75 mg total) by mouth daily.  Qty: 30 tablet, Refills: 11    isosorbide mononitrate (IMDUR) 30 MG 24 hr tablet Take 1 tablet (30 mg total) by mouth daily. Qty: 30 tablet, Refills: 6    nitroGLYCERIN (NITROSTAT) 0.4 MG SL tablet Place 1 tablet (0.4 mg total) under the tongue every 5 (five) minutes as needed for chest pain (up to 3 doses). Qty: 25 tablet, Refills: 3      CONTINUE these medications which have CHANGED   Details  potassium chloride (K-DUR,KLOR-CON) 10 MEQ tablet Take 1 tablet (10 mEq total) by mouth daily. Previous Rx said take "once" not "once daily" Qty: 30 tablet, Refills: 3    warfarin (COUMADIN) 3 MG tablet Continue taking 1/2 tablet everyday except 1 tablet on Tuesdays, Thursdays and Saturdays. Not changed just added actual instructions rather than "as directed"      CONTINUE these medications which have NOT CHANGED   Details  carvedilol (COREG) 25 MG tablet Take 1 tablet (25 mg total) by mouth 2 (two) times daily with a meal.     Cholecalciferol (VITAMIN D3 PO) Take 1,000 capsules by mouth 2 (two) times a week.     furosemide (LASIX) 40 MG tablet Take 0.5 tablets (20 mg total) by mouth daily.     gabapentin (NEURONTIN) 600 MG tablet Take 600 mg by mouth daily.    hydrocortisone (CORTEF) 20 MG tablet Take 20 mg by mouth 2 (two)  times daily.     lisinopril (PRINIVIL,ZESTRIL) 10 MG tablet Take 5 mg by mouth daily.    metoCLOPramide (REGLAN) 5 MG tablet Take 5 mg by mouth daily.     pantoprazole (PROTONIX) 40 MG tablet Take 1 tablet (40 mg total) by mouth daily.     PROAIR HFA 108 (90 BASE) MCG/ACT inhaler INHALE 2 PUFFS INTO THE LUNGS EVERY 6 (SIX) HOURS AS NEEDED FOR WHEEZING OR SHORTNESS OF BREATH.     thyroid (ARMOUR) 60 MG tablet Take 60 mg by mouth daily.     traMADol (ULTRAM) 50 MG tablet Take 100 mg by mouth 3 (three) times daily.     traZODone (DESYREL) 100 MG tablet Take 100 mg by mouth every evening.          Disposition   The patient will be discharged in stable condition to home. Discharge Instructions    Amb Referral to Cardiac Rehabilitation    Complete by:  As directed   Congestive Heart Failure: If diagnosis is Heart Failure, patient MUST meet each of the CMS criteria: 1. Left Ventricular Ejection Fraction </= 35% 2. NYHA class II-IV symptoms despite being on optimal heart failure therapy for at least 6 weeks. 3. Stable = have not had a recent (<6 weeks) or planned (<6 months) major cardiovascular hospitalization or procedure  Program Details: - Physician supervised classes - 1-3 classes per week over a 12-18 week period, generally for a total of 36 sessions  Physician Certification: I certify that the above Cardiac Rehabilitation treatment is medically necessary and is medically approved by me for treatment of this patient. The patient is willing and cooperative, able to ambulate and medically stable to participate in exercise rehabilitation. The participant's progress and Individualized Treatment Plan will be reviewed by the Medical Director, Cardiac Rehab staff and as indicated by the Referring/Ordering Physician.  Diagnosis:  PCI     Diet - low sodium heart healthy    Complete by:  As directed      Increase activity slowly    Complete by:  As directed  New medicines include  aspirin, Plavix, Imdur, and Nitroglycerin. Baby aspirin is available over the counter. You will have your Coumadin INR level rechecked on Monday 08/26/14. As soon as your INR level is therapeutic again (between 2-3), you will STOP your aspirin.  You will continue the same dose of Coumadin you were taking before you came into the hospital.  No driving for 2 days. No lifting over 5 lbs for 1 week. No sexual activity for 1 week. Keep procedure site clean & dry. If you notice increased pain, swelling, bleeding or pus, call/return!  You may shower, but no soaking baths/hot tubs/pools for 1 week.          Follow-up Information    Follow up with [facility].   Specialty:  Cardiology   Why:  Coumadin clinic appointment - 08/26/14 at 12:15pm.   Contact information:   [address], [place] [phone]      Follow up with Richardson Dopp, PA-C.   Specialties:  Physician Assistant, Radiology, Interventional Cardiology   Why:  [facility] - 09/05/14 at 1:30pm. Nicki Reaper is one of Dr. Elmarie Shiley PAs.   Contact information:   [address] [address] [phone]         Duration of Discharge Encounter: Greater than 30 minutes including physician and PA time.  Signed, Lisbeth Renshaw, [place] PA-C 08/22/2014, 11:14 AM  I have examined the patient and reviewed assessment and plan and discussed with patient.  Agree with above as stated.  Cr stable post PCI.  No further chest pain.  Continue DAPT until Coumadin therapeutic. THen COumadin and plavix.  See my note from earlier today.  Dennis Alabi S.

## 2014-08-22 NOTE — Progress Notes (Signed)
Patient: [name] / Admit Date: 08/20/2014 / Date of Encounter: 08/22/2014, 8:28 AM   Subjective: No CP or SOB.   Objective: Telemetry: NSR Physical Exam: Blood pressure 131/75, pulse 89, temperature 98.8 F (37.1 C), temperature source Oral, resp. rate 20, height 5\' 10"  (1.778 m), weight 219 lb 2.2 oz (99.4 kg), SpO2 92 %. General: Well developed obese WM in no acute distress. Head: Normocephalic, atraumatic, sclera non-icteric, no xanthomas, nares are without discharge. Neck:  JVP not elevated. Lungs: Coarse bilaterally to auscultation without wheezes, rales, or rhonchi. Breathing is unlabored. Heart: RRR S1 S2 without murmurs, rubs, or gallops.  Abdomen: Soft, non-tender, non-distended with normoactive bowel sounds. No rebound/guarding. Extremities: No clubbing or cyanosis. No edema. Distal pedal pulses are 2+ and equal bilaterally. Neuro: Alert and oriented X 3. Moves all extremities spontaneously. Psych:  Responds to questions appropriately with a normal affect.   Intake/Output Summary (Last 24 hours) at 08/22/14 0828 Last data filed at 08/22/14 0539  Gross per 24 hour  Intake    500 ml  Output   1250 ml  Net   -750 ml    Inpatient Medications:  . aspirin  81 mg Oral Daily  . carvedilol  25 mg Oral BID WC  . clopidogrel  75 mg Oral Q breakfast  . gabapentin  600 mg Oral Daily  . isosorbide mononitrate  30 mg Oral Daily  . lisinopril  5 mg Oral Daily  . metoCLOPramide  5 mg Oral Daily  . pantoprazole  40 mg Oral QAC breakfast  . sodium chloride  3 mL Intravenous Q12H  . sodium chloride  3 mL Intravenous Q12H  . sodium chloride  3 mL Intravenous Q12H  . thyroid  60 mg Oral Daily  . traZODone  100 mg Oral QPM  . Warfarin - Pharmacist Dosing Inpatient   Does not apply q1800   Infusions:  . heparin 1,200 Units/hr (08/22/14 0033)    Labs:  Recent Labs  08/21/14 0555 08/22/14 0327  NA 139 142  K 4.0 3.5  CL 107 108  CO2 24 26  GLUCOSE 86 96  BUN 17  10  CREATININE 1.42* 1.39*  CALCIUM 8.1* 8.3*   No results for input(s): AST, ALT, ALKPHOS, BILITOT, PROT, ALBUMIN in the last 72 hours.  Recent Labs  08/21/14 0555 08/22/14 0327  WBC 8.4 10.2  HGB 10.7* 10.6*  HCT 33.5* 32.8*  MCV 89.6 88.9  PLT 265 237   No results for input(s): CKTOTAL, CKMB, TROPONINI in the last 72 hours. Invalid input(s): POCBNP No results for input(s): HGBA1C in the last 72 hours.   Radiology/Studies:  No results found.   Assessment and Plan   1. Recent continued dyspnea with finding of CAD on diagnostic cath 7/26. On 7/27 he difficult PCI - had successful pressure wire interrogation of LAD stenosis with subsequent placement of DES in mid-segment, but it was complicated by wire-induced dissection in the distal segment of the LAD which was treated with repeated balloon inflations. He also had DES to rPDA - again had either spasm distal to the stent placement or an edge dissection but the vessel was small in that area and given the lack of symptoms and EKG changes, Dr. Fletcher Anon decided to abort the procedure. Plan is for ASA + Plavix + resumption of Coumadin, then discontinuation of aspirin once INR therapeutic. I will discuss with MD whether this is to occur inpatient or outpatient. Complicated hx of PE/DVT and GIB in the past.  2. 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 See above.   3. O2 desaturations - O2 92% on 2L this AM. Cardiac rehab to see to determine if he needs home O2. He reports he was not on this prior to admission. May need input from CHF team to see if there is anything else they want while he is here.  4. CKD stage III - Cr stable post-cath. Consider resuming home Lasix. 4. Addison's disease - resume home Solu-Cortef. 5. Chronic diastolic CHF, weight up a few pounds in-house but similar to outpatient weight on 7/22 (218lb). 6. Obesity Body  mass index is 31.44 kg/(m^2).  7. Mild anemia  Signed, Melina Copa PA-C Pager: [phone]   I have examined the patient and reviewed assessment and plan and discussed with patient.  Agree with above as stated.  He wants to go home. Discussed issues with anticoagulation but he still prefers to go home.  He does wear oxygen.  If he does well with cardiac rehab, will plan on d/c later today.  Stop aspirin when INR therapeutic.  Rubel Heckard S.

## 2014-08-26 ENCOUNTER — Ambulatory Visit (INDEPENDENT_AMBULATORY_CARE_PROVIDER_SITE_OTHER): Payer: BLUE CROSS/BLUE SHIELD | Admitting: *Deleted

## 2014-08-26 ENCOUNTER — Telehealth: Payer: Self-pay | Admitting: Cardiovascular Disease

## 2014-08-26 DIAGNOSIS — Z5181 Encounter for therapeutic drug level monitoring: Secondary | ICD-10-CM | POA: Diagnosis not present

## 2014-08-26 DIAGNOSIS — I82401 Acute embolism and thrombosis of unspecified deep veins of right lower extremity: Secondary | ICD-10-CM | POA: Diagnosis not present

## 2014-08-26 DIAGNOSIS — I2699 Other pulmonary embolism without acute cor pulmonale: Secondary | ICD-10-CM | POA: Diagnosis not present

## 2014-08-26 LAB — POCT INR: INR: 1.9

## 2014-08-26 NOTE — Telephone Encounter (Signed)
Symptoms noted, As we just learned, his leg swelling and other leg symptoms are likely related to his DVT and not his heart. He may need to check with his medical doctor for additional advice.

## 2014-08-26 NOTE — Telephone Encounter (Signed)
His PA pressures are normal. I suspect this leg swelling is due to possible residual DVT or venous insufficiency due to the DVT.    Leg elevation will help

## 2014-08-26 NOTE — Telephone Encounter (Signed)
Pt wife called as pt has gone back to lay down.  Pt has had swelling in feet and ankles since cath with no difference when laying down at night.  She has noticed any indention's in his feel or ankles but she has not specifically looked at it. Pt does not have any SOB but does have some congestions.  Pt was not given incentive spirometer during 2 night stay at hospital but educated pt wife on importance of ambulation and taking deep breaths to keep lungs open and may encourage coughing to decrease congestion.  Pt has not taken today's medications or checked his BP. According to wife, she has not checked his cath site but she stated he has not c/o about it and stated that he took the bandage off the other day.  Instructed wife that when he wakes up to check BP, weight, and swelling, and give [place] a call back.   Message routed to [name] for any suggestions

## 2014-08-26 NOTE — Telephone Encounter (Signed)
Spoke with pt he is having some periodic sharp pain lower extremity but it is not very often.  Pt stated that is weight is up 4 lbs since Friday as he did not weigh over the weekend, from 215 lbs to 219 lbs; earlier in the week pt weight had been 217 lbs. When he presses in the the swelling of his feet there is some pitting.  Pt stated he has been taking mediations as ordered. Pt unable to check BP as machine ran out of batteries. Pt has a scheduled Coumadin appt today, will let him know Dr. Elmarie Shiley suggestions of keeping feet elevated.  Will remind pt to check weight daily, and call our office if weight increases >3 lbs or greater over night or >5 lbs or greater in a week.

## 2014-08-26 NOTE — Telephone Encounter (Signed)
Pt c/o swelling: STAT is pt has developed SOB within 24 hours  1. How long have you been experiencing swelling? On and off for months. Surgery last week but sweeling over the weekend in his feet and ankles   2. Where is the swelling located? Feet and ankles   3.  Are you currently taking a "fluid pill"? Yes   4.  Are you currently SOB? No but he is experiencing congestion   5.  Have you traveled recently?No   Pt wife called states that they are concerned about the swelling and congestion

## 2014-08-27 NOTE — Telephone Encounter (Signed)
Patient's wife calling. Patient complaining of severe headache in the mornings, hand cordination is slightly off, feeling extremely tired, and  feels off balance when ambulating. Patient's recent VS HR 83, BP 133/87, and O2 98 % on room air. Patient is alert and oriented x 3. Patient does not have any one-sided weakness. Patient is keeping BLE elevated, and swelling slightly better. Patient is sleeping all day and night. Informed patient's wife that patient might be recovering from being in the hospital, where it is hard to get some rest. Encouraged patient's wife that his vital signs are stable. Will forward to [name] for further advisement.

## 2014-08-27 NOTE — Telephone Encounter (Signed)
Follow up      Pt had stents put in last week.  He is wanting to sleep a lot and seems to be confused.  Could this be from the stents?  Pt spoke to a nurse yesterday and forgot to ask this question.

## 2014-08-28 NOTE — Telephone Encounter (Signed)
Attempted to call patient- no answer and no voice mail.

## 2014-08-28 NOTE — Telephone Encounter (Signed)
Spoke with patient's wife who states patient is feeling better today, states she believes he has a common cold with sneezing, coughing, runny nose, and body aches.  She requests to know what patient should take for his cold symptoms.  I advised her to avoid medicines with decongestants and suggested Tylenol for fever/aches, Mucinex for cough/chest congestion, and plenty of fluids.  She reports BP 150 diastolic; states she cannot remember systolic reading. States prior to this reading, BP has been within normal range.  I advised her to continue to monitor and to call back if BP does not improve.  I reviewed Dr. Elmarie Shiley advice regarding the lower extremity swelling.  Patient's wife verbalized understanding and agreement with plan of care and thanked me for the call.

## 2014-09-02 ENCOUNTER — Ambulatory Visit: Payer: Medicare Other | Admitting: Cardiovascular Disease

## 2014-09-02 ENCOUNTER — Ambulatory Visit (INDEPENDENT_AMBULATORY_CARE_PROVIDER_SITE_OTHER): Payer: Medicare Other

## 2014-09-02 DIAGNOSIS — I82409 Acute embolism and thrombosis of unspecified deep veins of unspecified lower extremity: Secondary | ICD-10-CM

## 2014-09-02 DIAGNOSIS — Z5181 Encounter for therapeutic drug level monitoring: Secondary | ICD-10-CM

## 2014-09-02 DIAGNOSIS — I82401 Acute embolism and thrombosis of unspecified deep veins of right lower extremity: Secondary | ICD-10-CM | POA: Diagnosis not present

## 2014-09-02 DIAGNOSIS — I2699 Other pulmonary embolism without acute cor pulmonale: Secondary | ICD-10-CM

## 2014-09-02 LAB — POCT INR: INR: 1.7

## 2014-09-04 NOTE — Progress Notes (Signed)
Cardiology Office Note   Date:  09/05/2014   ID:  [name], DOB [birth year], MRN [id number]  PCP:   Melinda Crutch, MD  Cardiologist:  Dr. Liam Rogers   Electrophysiologist:  n/a  Chief Complaint  Patient presents with  . Coronary Artery Disease    s/p PCI     History of Present Illness: [name] is a 74 y.o. male with a hx of diastolic HF (prior nonischemic cardiomyopathy with systolic HF), pulmonary HTN, HTN, CKD stage 3, Addison's disease, AI, prior DVT, prior GI bleed (while on Xarelto).  Admitted in 04/2014 with submassive pulmonary embolism with associated R heart strain and RLE DVT.  He was placed on Coumadin.    He continued to dyspnea and LE edema.  CPX testing demonstrated a mild functional impairment.  The primary defect appeared to be circulatory in nature.  There was a suggestion of mild obstruction on PFTs.  Last seen by Dr. Liam Rogers 08/01/14.  Volume appeared to remain stable.  R/L heart cath was arranged.    R/L heart cath was done by Dr. Glori Bickers and demonstrated normal pulmonary pressures and elevated CO and 2 v CAD.  He therefore underwent 2 v PCI with a DES to the distal LAD (done with FFR) and a DES to the RPDA.  Procedure was complicated by wire-induced dissection in the distal segment of the LAD which was treated with repeated balloon inflations. He also had spasm distal to the stent placement or an edge dissection in the RCA. Patient was placed on aspirin plus Plavix plus Coumadin. Plan is to stop aspirin once his INR is therapeutic.  He returns for follow-up. He is here today with his son. He is doing well. Denies chest pain. Dyspnea is much improved. He does have some dyspnea with exertion and is NYHA 2b. He denies PND. He sleeps on 4 pillows chronically. LE edema is improved. He denies syncope. He denies near syncope. He does get dizzy at times. He has felt fatigued since his PCI. He is somewhat nauseated today. He does have occasional  wheezing. Denies fevers or cough.   Studies/Reports Reviewed Today:  PCI 08/21/14 1. Successful pressure wire interrogation of the LAD with subsequent placement of a drug-eluting stent in the midsegment for an initial 70% stenosis with 0% residual stenosis. This was complicated by a wire-induced dissection in the distal segment of the LAD which was treated with repeated balloon inflations.  2.75 x 18 mm Resolute Integrity DES 2. Successful drug-eluting stent placement to the right PDA. Initial stenosis was 80% which was reduced to 0%. Again, the patient was noted to have either spasm distal to the stent placement or an edge dissection but the vessel was small in that area and given the lack of symptoms and EKG changes I decided to abort the procedure.  2.75 x 18 mm Resolute Integrity DES Recommendations: This was overall a difficult procedure due to the mentioned issues. Continue hydration to prevent contrast-induced nephropathy. In terms of anticoagulation, warfarin can be resumed tonight. Heparin can be resumed in 8 hours. For now continue with aspirin and Plavix. Once INR is therapeutic, aspirin can be discontinued. The patient can continue on warfarin and Plavix.   R/L [facility] 08/20/14 LAD:  Dist 80% RCA:  Mid 20%, RPDA 95% RA = 6 RV = 28/4/10 PA = 29/15 (21) PCWP = 15 Ao = 148/77 (107) LV = 138/5/18 Fick CO/CI = 7.8/3.6 Thermo CO/CI = 6.5/3.0 PVR < 1 WU  Ao sat 93% PA sat = 71%, 74% High SVC sat = 68% RA sat = 69% Assessment: 1) 2V CAD  2) Normal pulmonary pressures with elevated cardiac output but no evidence of intracardiac shunt 3) Normal EF by echo  CPX 07/22/14 Conclusion: Exercise testing with gas exchange demonstrates a mild functional impairment when compared to matched sedentary norms. The primary defect appears to be circulatory in nature but the patient's obesity is also playing a role. There was a hypertensive response to exercise. Resting spirometry suggests mild  obstruction.   LE venous duplex 05/12/14 Findings consistent with acute deep vein thrombosis involving the posterior tibial, peroneal, popliteal, femoral, and common femoral veins of the right lower extremity.  Echo 05/10/14 EF 60-65%, no RWMA, Gr 1 DD, trivial AI  Myoview 08/15/12 Low risk stress nuclear study with a small, mild, fixed inferior defect consistent with inferior thinning; no ischemia. LV Ejection Fraction: 54%. LV Wall Motion: NL LV Function; NL Wall Motion   Past Medical History  Diagnosis Date  . Chronic diastolic CHF (congestive heart failure)     a. His initial ejection fraction was between 30 and 35%;  b. 04/2013 Echo: EF 50-55% Gr1 DD, mild-mod MR;  c. 04/2014 Echo: EF 60-65%, Gr 1 DD, triv TR.  Marland Kitchen Thyroid disease   . Pulmonary HTN   . Hypertension   . Addison's disease   . Hypogonadism male   . CKD (chronic kidney disease), stage III   . History of aortic insufficiency   . Hx of cardiovascular stress test     a. ETT-Myoview 7/14:  Low risk, inferior defect consistent with thinning, no ischemia, normal wall motion, EF 54%  . DVT (deep venous thrombosis)     a. 02/2013 superficial thrombophlebitis --> Xarelto later d/c'd 2/2 GIB;  b. 04/2014 Acute Right PT, Peroneal, Popliteal, Femoral, and common femoral DVT-->coumadin  . GERD (gastroesophageal reflux disease)   . H/O hiatal hernia   . Hepatitis     a. 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  . Daily headache   . Chronic back pain   . Peptic ulcer disease   . On home oxygen therapy     a. added 04/2014 in setting of PE.  Marland Kitchen Acute lower GI bleeding     a. admitted to [facility] 04/11/2013;  b. 04/2014 EGD: 1.  gastric erosions, mild prox gastritis and bulbar duodenitis->Protonix.  . Pulmonary embolism     a. 04/2014 CTA: Bilat submassive PE ->coumadin.  . Coronary artery disease     a. 07/2014 - s/p difficult PCI - had pressure wire analysis of LAD s/p DES in mid segment c/b wire-induced dissection in the distal segment of the LAD which was treated  with repeated balloon inflations. Also had DES to rPDA - again had either spasm distal to the stent placement or an edge dissection but the vessel was small in that area; procedure aborted.  . Obesity   . Anemia     Past Surgical History  Procedure Laterality Date  . US echocardiography  04/09/2010    EF 60-65%  . Tonsillectomy  1940's  . Appendectomy  1950's  . Cholecystectomy  ~ 1965  . Esophagogastroduodenoscopy N/A 04/11/2013    Procedure: ESOPHAGOGASTRODUODENOSCOPY (EGD);  Surgeon: Missy Sabins, MD;  Location[facility] ENDOSCOPY;  Service: Endoscopy;  Laterality: N/A;  . Esophagogastroduodenoscopy N/A 05/13/2014    Procedure: ESOPHAGOGASTRODUODENOSCOPY (EGD);  Surgeon: Arta Silence, MD;  Location[facility] ENDOSCOPY;  Service: Endoscopy;  Laterality: N/A;  . Cardiac catheterization  06/26/2008    EF 30-35%  . Cardiac catheterization N/A 08/20/2014    Procedure: Right/Left Heart Cath and Coronary Angiography;  Surgeon: Jolaine Artist, MD;  Location: [place] [facility] LAB;  Service: Cardiovascular;  Laterality: N/A;  . Cardiac catheterization N/A 08/21/2014    Procedure: Coronary Stent Intervention;  Surgeon: [place] Hampshire, MD;  Location: [place] [facility];  Service: Cardiovascular;  Laterality: N/A;     Current Outpatient Prescriptions  Medication Sig Dispense Refill  . carvedilol (COREG) 25 MG tablet Take 1 tablet (25 mg total) by mouth 2 (two) times daily with a meal. 180 tablet 1  . Cholecalciferol (VITAMIN D3 PO) Take 1,000 capsules by mouth 2 (two) times a week.     . clopidogrel (PLAVIX) 75 MG tablet Take 1 tablet (75 mg total) by mouth daily. 30 tablet 11  . furosemide (LASIX) 40 MG tablet Take 0.5 tablets (20 mg total) by mouth daily. 180 tablet 1  . gabapentin (NEURONTIN) 600 MG tablet Take 600 mg by mouth daily.    . hydrocortisone (CORTEF) 20 MG tablet Take 20 mg by mouth 2 (two) times daily.     . isosorbide mononitrate (IMDUR) 30 MG 24 hr tablet Take 1 tablet (30 mg total) by  mouth daily. 30 tablet 6  . lisinopril (PRINIVIL,ZESTRIL) 10 MG tablet Take 5 mg by mouth daily.    . metoCLOPramide (REGLAN) 5 MG tablet Take 5 mg by mouth daily.     . nitroGLYCERIN (NITROSTAT) 0.4 MG SL tablet Place 1 tablet (0.4 mg total) under the tongue every 5 (five) minutes as needed for chest pain (up to 3 doses). 25 tablet 3  . pantoprazole (PROTONIX) 40 MG tablet Take 1 tablet (40 mg total) by mouth daily. 30 tablet 0  . potassium chloride (K-DUR,KLOR-CON) 10 MEQ tablet Take 1 tablet (10 mEq total) by mouth daily. 30 tablet 3  . PROAIR HFA 108 (90 BASE) MCG/ACT inhaler INHALE 2 PUFFS INTO THE LUNGS EVERY 6 (SIX) HOURS AS NEEDED FOR WHEEZING OR SHORTNESS OF BREATH. 8.5 Inhaler 2  . thyroid (ARMOUR) 60 MG tablet Take 60 mg by mouth daily.     . traMADol (ULTRAM) 50 MG tablet Take 100 mg by mouth 3 (three) times daily.     . traZODone (DESYREL) 100 MG tablet Take 100 mg by mouth every evening.   2  . warfarin (COUMADIN) 3 MG tablet Continue taking 1/2 tablet everyday except 1 tablet on Tuesdays, Thursdays and Saturdays.    Marland Kitchen aspirin EC 81 MG EC tablet Take 1 tablet (81 mg total) by mouth daily. Stop aspirin as soon as Coumadin INR level is therapeutic (between 2-3). (Patient not taking: Reported on 09/05/2014)     No current facility-administered medications for this visit.    Allergies:   Review of patient's allergies indicates no known allergies.    Social History:  The patient  reports that he has never smoked. He has never used smokeless tobacco. He reports that he does not drink alcohol or use illicit drugs.   Family History:  The patient's family history includes Clotting disorder in his brother, brother, brother, mother, and other.    ROS:   Please see the history of present illness.   Review of Systems  All other systems reviewed and are negative.    PHYSICAL EXAM: VS:  BP 120/78 mmHg  Pulse 83  Ht 5\' 10"  (1.778 m)  Wt 224 lb 6.4 oz (101.787 kg)  BMI 32.20 kg/m2  Wt Readings from Last 3 Encounters:  09/05/14 224 lb 6.4 oz (101.787 kg)  08/22/14 219 lb 2.2 oz (99.4 kg)  08/16/14 218 lb 12.8 oz (99.247 kg)     GEN: Well nourished, well developed, in no acute distress HEENT: normal Neck: no JVD,  no masses Cardiac:  Normal S1/S2, RRR; no murmur ,  no rubs or gallops, no edema ; R wrist without hematoma; R groin without hematoma or bruit Respiratory:  clear to auscultation bilaterally, no wheezing, rhonchi or rales. GI: soft, nontender, nondistended, + BS MS: no deformity or atrophy Skin: warm and dry  Neuro:  CNs II-XII intact, Strength and sensation are intact Psych: Normal affect   EKG:  EKG is ordered today.  It demonstrates:   NSR, HR 83, normal axis   Recent Labs: 05/10/2014: B Natriuretic Peptide 26.9; Pro B Natriuretic peptide (BNP) 24.0 05/11/2014: Magnesium 2.1; TSH 0.322* 05/13/2014: ALT 14 08/22/2014: BUN 10; Creatinine, Ser 1.39*; Hemoglobin 10.6*; Platelets 237; Potassium 3.5; Sodium 142    Lipid Panel    Component Value Date/Time   CHOL 180 05/05/2010 1146   TRIG 128.0 05/05/2010 1146   HDL 31.30* 05/05/2010 1146   CHOLHDL 6 05/05/2010 1146   VLDL 25.6 05/05/2010 1146   LDLCALC 123* 05/05/2010 1146      ASSESSMENT AND PLAN:  Coronary artery disease involving native coronary artery of native heart without angina pectoris: Doing well after recent PCI to the LAD and right PDA with a DES. Procedure was complicated by spasm and distal dissection. He was placed on isosorbide. He has had some dizziness and lethargy since his procedure. I have recommended that we decrease his isosorbide to 15 mg daily. He has a prior history of GI bleeding. Check a follow-up BMET and CBC today.  He stopped aspirin. He should remain on aspirin + Plavix and Coumadin until his INR is therapeutic. He will then stop his aspirin and remain on Plavix + Coumadin. He will resume his aspirin until his INR is greater than 2. Continue beta blocker, ACE  inhibitor, nitrates. He is not on statin therapy for unclear reasons. I will place him on atorvastatin 40 mg daily. Check lipids and LFTs in 6 weeks.  Pulmonary embolus:  Continue Coumadin. He will need lifelong anticoagulation.   Pulmonary hypertension: Pulmonary pressures on his right heart catheterization were normal.   Chronic diastolic CHF (congestive heart failure): Volume status appears to be stable. Check follow-up BMET today.  CKD (chronic kidney disease) stage 3, GFR 30-59 ml/min: Creatinine recently stable. Check follow-up BMET.   Hyperlipidemia: Add atorvastatin as noted.   check follow-up lipids and LFTs in 6 weeks.    Medication Changes: Current medicines are reviewed at length with the patient today.  Concerns regarding medicines are as outlined above.  The following changes have been made:   Discontinued Medications   No medications on file   Modified Medications   No medications on file   New Prescriptions   No medications on file    Labs/ tests ordered today include:   No orders of the defined types were placed in this encounter.      Disposition:   FU with Dr. Liam Rogers 8 weeks.    Signed, Versie Starks, MHS 09/05/2014 2:05 PM    [facility] [place], [place], [place]  [address] Phone: [phone]; Fax: [phone]

## 2014-09-05 ENCOUNTER — Other Ambulatory Visit: Payer: Self-pay | Admitting: Emergency Medicine

## 2014-09-05 ENCOUNTER — Telehealth: Payer: Self-pay | Admitting: *Deleted

## 2014-09-05 ENCOUNTER — Other Ambulatory Visit: Payer: Self-pay | Admitting: Cardiovascular Disease

## 2014-09-05 ENCOUNTER — Ambulatory Visit (INDEPENDENT_AMBULATORY_CARE_PROVIDER_SITE_OTHER): Payer: BLUE CROSS/BLUE SHIELD | Admitting: Physician Assistant

## 2014-09-05 ENCOUNTER — Encounter: Payer: Self-pay | Admitting: Physician Assistant

## 2014-09-05 VITALS — BP 120/78 | HR 83 | Ht 70.0 in | Wt 224.4 lb

## 2014-09-05 DIAGNOSIS — I2699 Other pulmonary embolism without acute cor pulmonale: Secondary | ICD-10-CM | POA: Diagnosis not present

## 2014-09-05 DIAGNOSIS — N183 Chronic kidney disease, stage 3 unspecified: Secondary | ICD-10-CM

## 2014-09-05 DIAGNOSIS — E785 Hyperlipidemia, unspecified: Secondary | ICD-10-CM

## 2014-09-05 DIAGNOSIS — I5032 Chronic diastolic (congestive) heart failure: Secondary | ICD-10-CM

## 2014-09-05 DIAGNOSIS — I270 Primary pulmonary hypertension: Secondary | ICD-10-CM | POA: Diagnosis not present

## 2014-09-05 DIAGNOSIS — Z9861 Coronary angioplasty status: Secondary | ICD-10-CM

## 2014-09-05 DIAGNOSIS — I2510 Atherosclerotic heart disease of native coronary artery without angina pectoris: Secondary | ICD-10-CM

## 2014-09-05 DIAGNOSIS — I272 Pulmonary hypertension, unspecified: Secondary | ICD-10-CM

## 2014-09-05 LAB — BASIC METABOLIC PANEL
BUN: 24 mg/dL — ABNORMAL HIGH (ref 6–23)
CALCIUM: 9.7 mg/dL (ref 8.4–10.5)
CHLORIDE: 106 meq/L (ref 96–112)
CO2: 26 mEq/L (ref 19–32)
Creatinine, Ser: 2.01 mg/dL — ABNORMAL HIGH (ref 0.40–1.50)
GFR: 34.72 mL/min — ABNORMAL LOW (ref >60.00)
Glucose, Bld: 102 mg/dL — ABNORMAL HIGH (ref 70–99)
Potassium: 4.4 mEq/L (ref 3.5–5.1)
SODIUM: 141 meq/L (ref 135–145)

## 2014-09-05 LAB — CBC WITH DIFFERENTIAL/PLATELET
BASOS ABS: 0 10*3/uL (ref 0.0–0.1)
Basophils Relative: 0.4 % (ref 0.0–3.0)
Eosinophils Absolute: 0.3 10*3/uL (ref 0.0–0.7)
Eosinophils Relative: 2.4 % (ref 0.0–5.0)
HEMATOCRIT: 34.5 % — AB (ref 39.0–52.0)
Hemoglobin: 11.1 g/dL — ABNORMAL LOW (ref 13.0–17.0)
LYMPHS ABS: 2.4 10*3/uL (ref 0.7–4.0)
Lymphocytes Relative: 22.2 % (ref 12.0–46.0)
MCHC: 32.3 g/dL (ref 30.0–36.0)
MCV: 87.2 fl (ref 78.0–100.0)
MONOS PCT: 9.9 % (ref 3.0–12.0)
Monocytes Absolute: 1.1 10*3/uL — ABNORMAL HIGH (ref 0.1–1.0)
Neutro Abs: 7.2 10*3/uL (ref 1.4–7.7)
Neutrophils Relative %: 65.1 % (ref 43.0–77.0)
PLATELETS: 407 10*3/uL — AB (ref 150.0–400.0)
RBC: 3.96 Mil/uL — ABNORMAL LOW (ref 4.22–5.81)
RDW: 16.9 % — ABNORMAL HIGH (ref 11.5–15.5)
WBC: 11 10*3/uL — AB (ref 4.0–10.5)

## 2014-09-05 MED ORDER — ATORVASTATIN CALCIUM 40 MG PO TABS: 40.0000 mg | ORAL_TABLET | Freq: Every day | ORAL | Status: DC

## 2014-09-05 MED ORDER — POTASSIUM CHLORIDE CRYS ER 10 MEQ PO TBCR: 10.0000 meq | EXTENDED_RELEASE_TABLET | ORAL | Status: DC

## 2014-09-05 MED ORDER — FUROSEMIDE 40 MG PO TABS: 20.0000 mg | ORAL_TABLET | ORAL | Status: DC

## 2014-09-05 MED ORDER — ISOSORBIDE MONONITRATE ER 30 MG PO TB24: 15.0000 mg | ORAL_TABLET | Freq: Every day | ORAL | Status: DC

## 2014-09-05 NOTE — Telephone Encounter (Signed)
[name] wife notified of lab results and Hold Lasix, K+, Lisinopril x 2 days.; then resume lisinopril 5 mg on Sunday also starting Sunday take lasix and K+ every other day. Wife verbalized understanding.

## 2014-09-05 NOTE — Patient Instructions (Signed)
Medication Instructions:  1. DECREASE IMDUR TO 15 MG DAILY (1/2 TAB DAILY)  2. REMAIN ON ASPIRIN UNTIL YOU SEE THE COUMADIN CLINIC  3. START ATORVASTATIN 40 MG 1 TABLET EVERY NIGHT AT BEDTIME  Labwork: 1. TODAY BMET, CBC W/DIFF  2. FASTING LIPID AND LIVER PANEL IN 6 WEEKS  Testing/Procedures: NONE  Follow-Up: 6-8 WEEKS WITH [name]  Any Other Special Instructions Will Be Listed Below (If Applicable). [facility]

## 2014-09-09 ENCOUNTER — Ambulatory Visit (INDEPENDENT_AMBULATORY_CARE_PROVIDER_SITE_OTHER): Payer: BLUE CROSS/BLUE SHIELD | Admitting: *Deleted

## 2014-09-09 ENCOUNTER — Telehealth: Payer: Self-pay | Admitting: Cardiovascular Disease

## 2014-09-09 ENCOUNTER — Other Ambulatory Visit: Payer: Medicare Other

## 2014-09-09 ENCOUNTER — Other Ambulatory Visit (INDEPENDENT_AMBULATORY_CARE_PROVIDER_SITE_OTHER): Payer: BLUE CROSS/BLUE SHIELD | Admitting: *Deleted

## 2014-09-09 ENCOUNTER — Telehealth: Payer: Self-pay | Admitting: *Deleted

## 2014-09-09 DIAGNOSIS — I2699 Other pulmonary embolism without acute cor pulmonale: Secondary | ICD-10-CM | POA: Diagnosis not present

## 2014-09-09 DIAGNOSIS — I5032 Chronic diastolic (congestive) heart failure: Secondary | ICD-10-CM | POA: Diagnosis not present

## 2014-09-09 DIAGNOSIS — Z5181 Encounter for therapeutic drug level monitoring: Secondary | ICD-10-CM

## 2014-09-09 DIAGNOSIS — I82401 Acute embolism and thrombosis of unspecified deep veins of right lower extremity: Secondary | ICD-10-CM | POA: Diagnosis not present

## 2014-09-09 LAB — BASIC METABOLIC PANEL
BUN: 18 mg/dL (ref 6–23)
CALCIUM: 9.5 mg/dL (ref 8.4–10.5)
CO2: 25 mEq/L (ref 19–32)
Chloride: 108 mEq/L (ref 96–112)
Creatinine, Ser: 1.68 mg/dL — ABNORMAL HIGH (ref 0.40–1.50)
GFR: 42.7 mL/min — ABNORMAL LOW (ref >60.00)
Glucose, Bld: 101 mg/dL — ABNORMAL HIGH (ref 70–99)
Potassium: 4.1 mEq/L (ref 3.5–5.1)
Sodium: 141 mEq/L (ref 135–145)

## 2014-09-09 LAB — POCT INR: INR: 2.1

## 2014-09-09 NOTE — Telephone Encounter (Signed)
Spoke with patient's wife, Carlyon Shadow, who states she is concerned about the way the patient has been feeling over the past few days.  Reports patient had dizziness, fatigue yesterday; went to get up and said he felt like he would fall down if he tried to stand.  BP at that time was 103/56, she is unsure as to what his pulse was.  States he is sleeping more.  BP today is 136/72 and he reports he feels better than he did yesterday.  Patient had recent medications made after last week's ov with Richardson Dopp, PA due to elevated BUN/Creatine.  Darlene reports she gave him 1/2 Lisinopril yesterday after holding for 2 days as advised by Julaine Hua, [place] for Richardson Dopp.  She reports she gave him Lasix and K+ today and will continue every other day as advised.  He is scheduled for repeat bmet today.  She reports today's weight is 210 lb, no increase over the past few days.  Denies ankle/leg edema.  States he does not have much appetite and is sleeping more than usual.  I advised her that he is likely feeling the effects of the decreased kidney function, especially if he is not eating and drinking well.  I advised her to make certain he is well hydrated and following a healthy diet with regular meals through the day.  She reports they eat a good deal of fresh fruits and vegetables and lean protein but admits patient has probably not had enough to eat or drink over the past few days.  I advised her to encourage this with him and that we will follow-up after his repeat bmet today.  I advised her to call back with questions or concerns prior to that time.  Darlene verbalized understanding and agreement.

## 2014-09-09 NOTE — Telephone Encounter (Signed)
Pt notfied of lab results by phone with verbal understanding

## 2014-09-09 NOTE — Telephone Encounter (Signed)
I have called [facility].  Spoke to his wife, Carlyon Shadow Most of his issues are not cardiac. He has moderate CKD - his lasix has been cut back for a creatinine of 2.0. His creatinine has been 2.0 in the past . He has various aches and pain .   I have reassured him that his complaints are mainly noncardiac and that he should contact his medical doctor for further advice.   I've advised him to decrease his Kdur to every other day along with the lasix .     [name], Wonda Cheng, MD  09/09/2014 11:06 AM    [facility],  [place], [place]  [address] Pager [phone] Phone: [phone]; Fax: [phone]   [facility]  [address] [place], [place]  [address] [phone]   Fax [phone]

## 2014-09-09 NOTE — Telephone Encounter (Signed)
New message    Patient wife calling to speak with nurse.

## 2014-09-11 ENCOUNTER — Ambulatory Visit (HOSPITAL_BASED_OUTPATIENT_CLINIC_OR_DEPARTMENT_OTHER): Payer: Medicare Other

## 2014-09-11 DIAGNOSIS — I2699 Other pulmonary embolism without acute cor pulmonale: Secondary | ICD-10-CM

## 2014-09-12 ENCOUNTER — Telehealth: Payer: Self-pay | Admitting: Cardiovascular Disease

## 2014-09-12 MED ORDER — CARVEDILOL 25 MG PO TABS: 12.5000 mg | ORAL_TABLET | Freq: Two times a day (BID) | ORAL | Status: DC

## 2014-09-12 NOTE — Telephone Encounter (Addendum)
Spoke with patient's wife, Carlyon Shadow, who states patient is sweaty today, feels weak and has lower than normal BP.  She reports BP 90/53 mmHg today, pulse 70's bpm.  States patient continues to take Lisinopril 5 mg daily, Lasix 20 mg every other day (due tomorrow), and Carvedilol 25 mg BID.  She states he feels weak and like he will fall if he walks around too much, so he has spent much of the day in the bed.  He denies SOB.  She reviewed his diet with me and he has likely not eaten enough today but continues to stay well hydrated.  I advised her that he may have a salty snack to help with light-headedness, low blood pressure and that possibly the Carvedilol dose is too high.  I was able to talk with [name], who is in the [facility] office today and he advised patient may decrease Carvedilol to 12.5 mg BID. I advised her to call back Monday to report how patient is doing. Darlene verbalized understanding and agreement with plan of care and thanked me for the call.

## 2014-09-12 NOTE — Telephone Encounter (Signed)
New Message  Pt requested to speak w/ RN concerning pt sweating and lo BP. Please call back and discuss.

## 2014-09-14 LAB — CARDIOLIPIN ANTIBODIES, IGG, IGM, IGA
ANTICARDIOLIPIN IGG: 19 GPL U/mL (ref <23)
ANTICARDIOLIPIN IGM: 14 [MPL'U]/mL — AB (ref <11)
Anticardiolipin IgA: 6 APL U/mL (ref <22)

## 2014-09-14 LAB — BETA-2 GLYCOPROTEIN ANTIBODIES
BETA 2 GLYCO I IGG: 4 G Units (ref <20)
BETA-2-GLYCOPROTEIN I IGA: 39 A Units — AB (ref <20)
Beta-2-Glycoprotein I IgM: 49 M Units — ABNORMAL HIGH (ref <20)

## 2014-09-14 LAB — LUPUS ANTICOAGULANT PANEL

## 2014-09-16 ENCOUNTER — Telehealth: Payer: Self-pay | Admitting: Cardiovascular Disease

## 2014-09-16 ENCOUNTER — Ambulatory Visit: Payer: Medicare Other | Admitting: Hematology and Oncology

## 2014-09-16 LAB — RFX DRVVT SCR W/RFLX CONF 1:1 MIX: dRVVT Screen: 72 s — ABNORMAL HIGH (ref <=45)

## 2014-09-16 NOTE — Assessment & Plan Note (Signed)
Acute bilateral pulmonary emboli 05/11/2014 with evidence of right heart strain, thrombus extends into the left upper lobe and lingular segmental arteries, on the right thrombus in the right upper lobe pulmonary artery extending into segmental and subsegmental branches, right middle lobe branches and lower lobe branches. Acute deep vein thrombosis involving the posterior tibial, peroneal, popliteal, femoral, and common femoral veins of the right lower extremity.   Current treatment: Coumadin  Hypercoagulability workup. 1. factor V Leiden: Normal 2. prothrombin gene mutation: Not detected  3. antithrombin III: Normal  4. Homocysteine: Normal 13.7 5. Antiphospholipid antibodies: Functional assay was normal but beaded 2 glycoprotein 1 IgG (26) and IgA (22) were elevated, anticardiolipin IgM elevated at 15; repeat testing on 09/14/2014 showed persistent positive, anti-Cardiolipin IgM antibody as well as beta-2 glycoprotein IgM and IgA antibodies 6. 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 MTHFR gene mutation: Not detected   Plan for anticoagulation:Based on persistently positive antibodies, I recommend Indefinite anticoagulation unless there are contraindications.  Severe dyspnea: Due to coronary artery disease status post difficult PCI Chronic diastolic CHF Chronic kidney disease stage III  Return to clinic in 6 months for follow-up

## 2014-09-16 NOTE — Telephone Encounter (Signed)
New message      Pt c/o BP issue: STAT if pt c/o blurred vision, one-sided weakness or slurred speech  1. What are your last 5 BP readings? 125/75 today @ 8:45am   2. Are you having any other symptoms (ex. Dizziness, headache, blurred vision, passed out)? Dizziness all week  3. What is your BP issue? B/p was running lower last week but seems to be back to normal  Pt wife state pt is not feeling well and pt is sleeping a lot which is not normal for him Pt wife states no chest pain

## 2014-09-16 NOTE — Telephone Encounter (Signed)
Spoke with patient's wife, Carlyon Shadow, who states patient's BP has improved since cutting the Carvedilol dose in half but dizziness persists, even when patient is laying down.  I advised her that with the improved BP and constant dizziness she should call PCP for evaluation of other causes of dizziness.  She states he has hx of vertigo, which has been well controlled for years but may have returned.  I advised her to keep follow-up appointment in September with [name] and to call sooner with questions or concerns.  She verbalized understanding and agreement and thanked me for the call.

## 2014-09-18 ENCOUNTER — Telehealth (HOSPITAL_COMMUNITY): Payer: Self-pay

## 2014-09-18 NOTE — Telephone Encounter (Signed)
Phone call to enroll in CRPII, pt declined at this time. Will check with spouse and call back when/if interested.

## 2014-09-23 ENCOUNTER — Ambulatory Visit (INDEPENDENT_AMBULATORY_CARE_PROVIDER_SITE_OTHER): Payer: BLUE CROSS/BLUE SHIELD

## 2014-09-23 DIAGNOSIS — I82401 Acute embolism and thrombosis of unspecified deep veins of right lower extremity: Secondary | ICD-10-CM | POA: Diagnosis not present

## 2014-09-23 DIAGNOSIS — I2699 Other pulmonary embolism without acute cor pulmonale: Secondary | ICD-10-CM | POA: Diagnosis not present

## 2014-09-23 DIAGNOSIS — Z5181 Encounter for therapeutic drug level monitoring: Secondary | ICD-10-CM | POA: Diagnosis not present

## 2014-09-23 LAB — POCT INR: INR: 1.8

## 2014-10-07 ENCOUNTER — Ambulatory Visit (INDEPENDENT_AMBULATORY_CARE_PROVIDER_SITE_OTHER): Payer: BLUE CROSS/BLUE SHIELD | Admitting: *Deleted

## 2014-10-07 ENCOUNTER — Other Ambulatory Visit (INDEPENDENT_AMBULATORY_CARE_PROVIDER_SITE_OTHER): Payer: BLUE CROSS/BLUE SHIELD | Admitting: *Deleted

## 2014-10-07 DIAGNOSIS — I82409 Acute embolism and thrombosis of unspecified deep veins of unspecified lower extremity: Secondary | ICD-10-CM | POA: Diagnosis not present

## 2014-10-07 DIAGNOSIS — I10 Essential (primary) hypertension: Secondary | ICD-10-CM

## 2014-10-07 DIAGNOSIS — I2699 Other pulmonary embolism without acute cor pulmonale: Secondary | ICD-10-CM

## 2014-10-07 DIAGNOSIS — I82401 Acute embolism and thrombosis of unspecified deep veins of right lower extremity: Secondary | ICD-10-CM | POA: Diagnosis not present

## 2014-10-07 DIAGNOSIS — Z5181 Encounter for therapeutic drug level monitoring: Secondary | ICD-10-CM | POA: Diagnosis not present

## 2014-10-07 DIAGNOSIS — I5032 Chronic diastolic (congestive) heart failure: Secondary | ICD-10-CM | POA: Diagnosis not present

## 2014-10-07 DIAGNOSIS — I200 Unstable angina: Secondary | ICD-10-CM | POA: Diagnosis not present

## 2014-10-07 LAB — HEPATIC FUNCTION PANEL
ALBUMIN: 3.7 g/dL (ref 3.5–5.2)
ALK PHOS: 49 U/L (ref 39–117)
ALT: 12 U/L (ref 0–53)
AST: 14 U/L (ref 0–37)
BILIRUBIN TOTAL: 0.2 mg/dL (ref 0.2–1.2)
Bilirubin, Direct: 0 mg/dL (ref 0.0–0.3)
Total Protein: 6.6 g/dL (ref 6.0–8.3)

## 2014-10-07 LAB — LIPID PANEL
CHOL/HDL RATIO: 6
Cholesterol: 206 mg/dL — ABNORMAL HIGH (ref 0–200)
HDL: 34 mg/dL — AB (ref >39.00)
Triglycerides: 492 mg/dL — ABNORMAL HIGH (ref 0.0–149.0)

## 2014-10-07 LAB — POCT INR: INR: 2

## 2014-10-07 LAB — LDL CHOLESTEROL, DIRECT: LDL DIRECT: 97 mg/dL

## 2014-10-07 NOTE — Addendum Note (Signed)
Addended by: Eulis Foster on: 10/07/2014 08:31 AM   Modules accepted: Orders

## 2014-10-07 NOTE — Addendum Note (Signed)
Addended by: Eulis Foster on: 10/07/2014 08:32 AM   Modules accepted: Orders

## 2014-10-14 ENCOUNTER — Encounter: Payer: Self-pay | Admitting: Cardiovascular Disease

## 2014-10-14 ENCOUNTER — Ambulatory Visit (INDEPENDENT_AMBULATORY_CARE_PROVIDER_SITE_OTHER): Payer: BLUE CROSS/BLUE SHIELD | Admitting: Cardiovascular Disease

## 2014-10-14 VITALS — BP 106/70 | HR 85 | Ht 70.0 in | Wt 214.8 lb

## 2014-10-14 DIAGNOSIS — I2510 Atherosclerotic heart disease of native coronary artery without angina pectoris: Secondary | ICD-10-CM

## 2014-10-14 DIAGNOSIS — I2699 Other pulmonary embolism without acute cor pulmonale: Secondary | ICD-10-CM

## 2014-10-14 NOTE — Patient Instructions (Signed)
Medication Instructions:  Your physician recommends that you continue on your current medications as directed. Please refer to the Current Medication list given to you today.   Labwork: None Ordered   Testing/Procedures: None Ordered   Follow-Up: Your physician wants you to follow-up in: 6 months with Dr. [name].  You will receive a reminder letter in the mail two months in advance. If you don't receive a letter, please call our office to schedule the follow-up appointment.     

## 2014-10-14 NOTE — Progress Notes (Signed)
Patient Name: [name] Date of Encounter: 10/14/2014  Primary Care Provider:   Melinda Crutch, MD Primary Cardiologist:  Dennis Nam, MD   Chief Complaint  74 year old male who presents for hospital follow-up related to recent admission for bilateral PE and right lower extremity DVT.  Past Medical History   Past Medical History  Diagnosis Date  . Chronic diastolic CHF (congestive heart failure)     a. His initial ejection fraction was between 30 and 35%;  b. 04/2013 Echo: EF 50-55% Gr1 DD, mild-mod MR;  c. 04/2014 Echo: EF 60-65%, Gr 1 DD, triv TR.  Marland Kitchen Thyroid disease   . Pulmonary HTN   . Hypertension   . Addison's disease   . Hypogonadism male   . CKD (chronic kidney disease), stage III   . History of aortic insufficiency   . Hx of cardiovascular stress test     a. ETT-Myoview 7/14:  Low risk, inferior defect consistent with thinning, no ischemia, normal wall motion, EF 54%  . DVT (deep venous thrombosis)     a. 02/2013 superficial thrombophlebitis --> Xarelto later d/c'd 2/2 GIB;  b. 04/2014 Acute Right PT, Peroneal, Popliteal, Femoral, and common femoral DVT-->coumadin  . GERD (gastroesophageal reflux disease)   . H/O hiatal hernia   . Hepatitis     a. 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  . Daily headache   . Chronic back pain   . Peptic ulcer disease   . On home oxygen therapy     a. added 04/2014 in setting of PE.  Marland Kitchen Acute lower GI bleeding     a. admitted to [facility] 04/11/2013;  b. 04/2014 EGD: 1.  gastric erosions, mild prox gastritis and bulbar duodenitis->Protonix.  . Pulmonary embolism     a. 04/2014 CTA: Bilat submassive PE ->coumadin.  . Coronary artery disease     a. 07/2014 - s/p difficult PCI - had pressure wire analysis of LAD s/p DES in mid segment c/b wire-induced dissection in the distal segment of the LAD which was treated with repeated balloon inflations. Also had DES to rPDA - again had either spasm distal to the stent placement or an edge dissection but the vessel was small in  that area; procedure aborted.  . Obesity   . Anemia    Past Surgical History  Procedure Laterality Date  . US echocardiography  04/09/2010    EF 60-65%  . Tonsillectomy  1940's  . Appendectomy  1950's  . Cholecystectomy  ~ 1965  . Esophagogastroduodenoscopy N/A 04/11/2013    Procedure: ESOPHAGOGASTRODUODENOSCOPY (EGD);  Surgeon: Dennis Sabins, MD;  Location: [facility] ENDOSCOPY;  Service: Endoscopy;  Laterality: N/A;  . Esophagogastroduodenoscopy N/A 05/13/2014    Procedure: ESOPHAGOGASTRODUODENOSCOPY (EGD);  Surgeon: Dennis Silence, MD;  Location: [facility] ENDOSCOPY;  Service: Endoscopy;  Laterality: N/A;  . Cardiac catheterization  06/26/2008    EF 30-35%  . Cardiac catheterization N/A 08/20/2014    Procedure: Right/Left Heart Cath and Coronary Angiography;  Surgeon: Dennis Artist, MD;  Location: [facility] CV LAB;  Service: Cardiovascular;  Laterality: N/A;  . Cardiac catheterization N/A 08/21/2014    Procedure: Coronary Stent Intervention;  Surgeon: Dennis Hampshire, MD;  Location: [place] CV LAB;  Service: Cardiovascular;  Laterality: N/A;    Allergies  No Known Allergies  HPI He was seen by Dennis Bayley, NP in April, 2016 - note is as below  74 year old male with the above complex problem list. I last saw him in clinic about 2 weeks  ago, at which time he reported a 4 week history of dyspnea and lower extremity edema, which was initially treated as an upper respiratory infection. He did have evidence of volume overload on exam and we increased his Lasix. He was seen 3 days later secondary to ongoing dyspnea despite diuresis. D-dimer was drawn and was elevated at 4. He was referred to the hospital for CT angiography. This was performed early on April 16 and revealed bilateral submassive pulmonary emboli. He was admitted to medicine and placed on heparin. Radical care in interventional radiology were consulted and did not feel that he met criteria for TPA. He was hemodynamically stable  throughout his hospitalization and converted to Coumadin. Prior to this, he was seen by GI given prior history of GI bleeding and an EGD was performed on April 18 and showed gastric erosions without evidence of active bleeding. PPI therapy was prescribed.  Since his discharge, he has continued to have dyspnea with minimal exertion. He has been on Lovenox bridging and finish his last Lovenox injection yesterday morning. He has not yet had her INR checked. He is not having chest pain, palpitations, PND, orthopnea, dizziness, syncope, edema, or early satiety. Previous symptoms of volume overload including increasing abdominal girth and edema have more or less resolved.   August 01, 2014:    [name] returns for evaluation of his chronic shortness of breath and leg edema. The cardiopulmonary stress test revealed mild functional impairment suggestive of a circulatory issue. He returns today to discuss right left heart catheterization.  Still has shortness of breath. Unable to do anything without gasping for air - doing ADLs cause significant dyspnea . Has had progressive DOE over the past month or so    Sept. 19, 2016:  [name] had PCI of the mid - distal LAD a month ago. mid to distal LAD.  2.75 x 18 mm resolute integrity drug-eluting stent which was deployed to 14 atm. This was postdilated with a 3.0 x 15 mm noncompliant balloon. His right-sided pressures were completely normal.  Complains of some mild swelling of his right ankle. Thinks that he is able to do more exercise and recover quicker.  Breathing is ok   Home Medications  Current Outpatient Prescriptions  Medication Sig Dispense Refill  . atorvastatin (LIPITOR) 40 MG tablet Take 1 tablet (40 mg total) by mouth daily. 30 tablet 11  . carvedilol (COREG) 6.25 MG tablet Take 6.25 mg by mouth 2 (two) times daily with a meal.    . Cholecalciferol (VITAMIN D3 PO) Take 1,000 capsules by mouth 2 (two) times a week.     . clopidogrel (PLAVIX) 75 MG  tablet Take 1 tablet (75 mg total) by mouth daily. 30 tablet 11  . furosemide (LASIX) 40 MG tablet Take 0.5 tablets (20 mg total) by mouth every other day.    . gabapentin (NEURONTIN) 600 MG tablet Take 600 mg by mouth daily.    . hydrocortisone (CORTEF) 20 MG tablet Take 20 mg by mouth 2 (two) times daily.     . isosorbide mononitrate (IMDUR) 30 MG 24 hr tablet Take 0.5 tablets (15 mg total) by mouth daily.    Marland Kitchen lisinopril (PRINIVIL,ZESTRIL) 10 MG tablet Take 5 mg by mouth daily.    . metoCLOPramide (REGLAN) 5 MG tablet Take 5 mg by mouth daily.     . nitroGLYCERIN (NITROSTAT) 0.4 MG SL tablet Place 1 tablet (0.4 mg total) under the tongue every 5 (five) minutes as needed for chest pain (up  to 3 doses). 25 tablet 3  . pantoprazole (PROTONIX) 40 MG tablet Take 1 tablet (40 mg total) by mouth daily. 30 tablet 0  . potassium chloride (K-DUR,KLOR-CON) 10 MEQ tablet Take 1 tablet (10 mEq total) by mouth every other day.    Marland Kitchen PROAIR HFA 108 (90 BASE) MCG/ACT inhaler INHALE 2 PUFFS INTO THE LUNGS EVERY 6 (SIX) HOURS AS NEEDED FOR WHEEZING OR SHORTNESS OF BREATH. 8.5 Inhaler 2  . thyroid (ARMOUR) 60 MG tablet Take 60 mg by mouth daily.     . traMADol (ULTRAM) 50 MG tablet Take 100 mg by mouth 3 (three) times daily.     . traZODone (DESYREL) 100 MG tablet Take 100 mg by mouth every evening.   2  . warfarin (COUMADIN) 3 MG tablet Continue taking 1/2 tablet everyday except 1 tablet on Tuesdays, Thursdays and Saturdays.    Marland Kitchen warfarin (COUMADIN) 3 MG tablet TAKE AS DIRECTED BY COUMADIN CLINIC 30 tablet 3   No current facility-administered medications for this visit.                                                                                                                       Review of Systems  As above, he continues to experience DOE.  He denies chest pain, palpitations, pnd, orthopnea, n, v, dizziness, syncope, edema, weight gain, or early satiety. All other systems reviewed and  are otherwise negative except as noted above.  Physical Exam  VS:  BP 106/70 mmHg  Pulse 85  Ht 5' 10"  (1.778 m)  Wt 97.433 kg (214 lb 12.8 oz)  BMI 30.82 kg/m2  SpO2 98% , BMI Body mass index is 30.82 kg/(m^2). GEN: Well nourished, well developed, in no acute distress. HEENT: normal. Neck: Supple, no JVD, carotid bruits, or masses. Cardiac: RRR, no murmurs, rubs, or gallops. No clubbing, cyanosis.  Trace bilat LE edema.  Radials/DP/PT 2+ and equal bilaterally.  His posterior tibialis pulses is 2+ bilateral  Respiratory:  Respirations regular and unlabored, clear to auscultation bilaterally. GI: Soft, nontender, nondistended, BS + x 4. MS: no deformity or atrophy. Skin: warm and dry, no rash. Neuro:  Strength and sensation are intact. Psych: Normal affect.  Accessory Clinical Findings    Assessment & Plan  1.  Bilateral pulmonary emboli/right lower extremity DVT:    On warfarin,    2. Chronic diastolic congestive heart failure: Volume appears stable today. Heart rate and blood pressure stable. Continue Lasix at current dose. spirinolactone was stopped by his nephrologist.  He did not notice any worsening of his dyspnea.   3. Hypertension: Stable on current regimen including blocker, ACE inhibitor, and calcium channel blocker therapy.     4. CAD - s/p PCI .   No angina .   5.  Peripheral neuropathy:  He has excellent distal pulses in his feet. The burning that he is having in his feet and toes are  not due to peripheral arterial disease. I suspect that he has peripheral  neuropathy. He should talk to his general medical doctor about that. He does not have diabetes.  [name], Wonda Cheng, MD  10/14/2014 11:54 AM    [address],  [place], [place]  [address] Pager [phone] Phone: [phone]; Fax: [phone]   [facility]  [address] [place], [place]  [address] [phone]    Fax [phone]

## 2014-10-21 ENCOUNTER — Ambulatory Visit: Payer: BLUE CROSS/BLUE SHIELD | Admitting: Pharmacist

## 2014-10-28 ENCOUNTER — Ambulatory Visit (INDEPENDENT_AMBULATORY_CARE_PROVIDER_SITE_OTHER): Payer: BLUE CROSS/BLUE SHIELD | Admitting: Pharmacist

## 2014-10-28 DIAGNOSIS — I82401 Acute embolism and thrombosis of unspecified deep veins of right lower extremity: Secondary | ICD-10-CM

## 2014-10-28 DIAGNOSIS — Z5181 Encounter for therapeutic drug level monitoring: Secondary | ICD-10-CM | POA: Diagnosis not present

## 2014-10-28 DIAGNOSIS — I2510 Atherosclerotic heart disease of native coronary artery without angina pectoris: Secondary | ICD-10-CM

## 2014-10-28 DIAGNOSIS — I2699 Other pulmonary embolism without acute cor pulmonale: Secondary | ICD-10-CM | POA: Diagnosis not present

## 2014-10-28 DIAGNOSIS — E785 Hyperlipidemia, unspecified: Secondary | ICD-10-CM

## 2014-10-28 LAB — POCT INR: INR: 1.7

## 2014-10-28 MED ORDER — ATORVASTATIN CALCIUM 80 MG PO TABS: 80.0000 mg | ORAL_TABLET | Freq: Every day | ORAL | Status: DC

## 2014-10-28 MED ORDER — FENOFIBRATE 48 MG PO TABS: 48.0000 mg | ORAL_TABLET | Freq: Every day | ORAL | Status: DC

## 2014-10-28 NOTE — Progress Notes (Signed)
Patient ID: [name]                 DOB: 2040/11/30, 74 yo                        MRN: [id number]     HPI: [name] is a 74 y.o. male patient referred to lipid clinic by [name]. PMH is significant for HLD and CAD s/p PCI, diastolic HF (prior nonischemic cardiomyopathy with systolic HF), pulmonary HTN, HTN, CKD stage 3, Addison's disease, prior DVT/PE and GI bleed while on Xarelto. He already takes atorvastatin 40mg  daily for his HLD and was referred to discuss his elevated TG today.  Current Medications: atorvastatin 40mg  daily - no issues tolerating Intolerances: none Risk Factors: CAD s/p PCI, sex, age LDL goal: 70mg /dL, TG goal 150mg /dL  Diet: Spent an extensive amount of time discussing diet. For breakfast, patient eats oats with flax seed, 3 eggs, liver pudding, and fried grits with syrup. For lunch/dinner - lightly fried fish or chicken, vegetables and potatoes. Likes to put honey on a lot of his food. Snacks on [employer] italian ice - has 3-4 a day. Drinks regular gatorade and water.  Exercise: Stays active in his yard. Would like to start walking more with his wife  Family History: Clotting disorders in his 3 brothers and mother.  Social History: Has never smoked, does not drink alcohol or use illicit drugs.  Labs: 09/2014: TC 206, TG 492, HDL 34, LDL-d 97, LFTs wnl (atorvastatin 40mg  daily) TG normal in 2012, most recent A1c in 04/2014 was prediabetic at 6.2  Past Medical History  Diagnosis Date  . Chronic diastolic CHF (congestive heart failure)     a. His initial ejection fraction was between 30 and 35%;  b. 04/2013 Echo: EF 50-55% Gr1 DD, mild-mod MR;  c. 04/2014 Echo: EF 60-65%, Gr 1 DD, triv TR.  Marland Kitchen Thyroid disease   . Pulmonary HTN   . Hypertension   . Addison's disease   . Hypogonadism male   . CKD (chronic kidney disease), stage III   . History of aortic insufficiency   . Hx of cardiovascular stress test     a. ETT-Myoview 7/14:  Low risk, inferior  defect consistent with thinning, no ischemia, normal wall motion, EF 54%  . DVT (deep venous thrombosis)     a. 02/2013 superficial thrombophlebitis --> Xarelto later d/c'd 2/2 GIB;  b. 04/2014 Acute Right PT, Peroneal, Popliteal, Femoral, and common femoral DVT-->coumadin  . GERD (gastroesophageal reflux disease)   . H/O hiatal hernia   . Hepatitis     a. 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  . Daily headache   . Chronic back pain   . Peptic ulcer disease   . On home oxygen therapy     a. added 04/2014 in setting of PE.  Marland Kitchen Acute lower GI bleeding     a. admitted to [facility] 04/11/2013;  b. 04/2014 EGD: 1.  gastric erosions, mild prox gastritis and bulbar duodenitis->Protonix.  . Pulmonary embolism     a. 04/2014 CTA: Bilat submassive PE ->coumadin.  . Coronary artery disease     a. 07/2014 - s/p difficult PCI - had pressure wire analysis of LAD s/p DES in mid segment c/b wire-induced dissection in the distal segment of the LAD which was treated with repeated balloon inflations. Also had DES to rPDA - again had either spasm distal to the stent placement or an edge dissection  but the vessel was small in that area; procedure aborted.  . Obesity   . Anemia     Current Outpatient Prescriptions on File Prior to Visit  Medication Sig Dispense Refill  . carvedilol (COREG) 6.25 MG tablet Take 6.25 mg by mouth 2 (two) times daily with a meal.    . Cholecalciferol (VITAMIN D3 PO) Take 1,000 capsules by mouth 2 (two) times a week.     . clopidogrel (PLAVIX) 75 MG tablet Take 1 tablet (75 mg total) by mouth daily. 30 tablet 11  . furosemide (LASIX) 40 MG tablet Take 0.5 tablets (20 mg total) by mouth every other day.    . gabapentin (NEURONTIN) 600 MG tablet Take 600 mg by mouth daily.    . hydrocortisone (CORTEF) 20 MG tablet Take 20 mg by mouth 2 (two) times daily.     . isosorbide mononitrate (IMDUR) 30 MG 24 hr tablet Take 0.5 tablets (15 mg total) by mouth daily.    Marland Kitchen lisinopril (PRINIVIL,ZESTRIL) 10 MG tablet Take 5 mg by  mouth daily.    . metoCLOPramide (REGLAN) 5 MG tablet Take 5 mg by mouth daily.     . nitroGLYCERIN (NITROSTAT) 0.4 MG SL tablet Place 1 tablet (0.4 mg total) under the tongue every 5 (five) minutes as needed for chest pain (up to 3 doses). 25 tablet 3  . pantoprazole (PROTONIX) 40 MG tablet Take 1 tablet (40 mg total) by mouth daily. 30 tablet 0  . potassium chloride (K-DUR,KLOR-CON) 10 MEQ tablet Take 1 tablet (10 mEq total) by mouth every other day.    Marland Kitchen PROAIR HFA 108 (90 BASE) MCG/ACT inhaler INHALE 2 PUFFS INTO THE LUNGS EVERY 6 (SIX) HOURS AS NEEDED FOR WHEEZING OR SHORTNESS OF BREATH. 8.5 Inhaler 2  . thyroid (ARMOUR) 60 MG tablet Take 60 mg by mouth daily.     . traMADol (ULTRAM) 50 MG tablet Take 100 mg by mouth 3 (three) times daily.     . traZODone (DESYREL) 100 MG tablet Take 100 mg by mouth every evening.   2  . warfarin (COUMADIN) 3 MG tablet Continue taking 1/2 tablet everyday except 1 tablet on Tuesdays, Thursdays and Saturdays.    Marland Kitchen warfarin (COUMADIN) 3 MG tablet TAKE AS DIRECTED BY COUMADIN CLINIC 30 tablet 3   No current facility-administered medications on file prior to visit.    No Known Allergies  Assessment/Plan:  1. Hyperlipidemia - Patient with LDL of 97mg /dL above goal 70mg /dL given history of CAD s/p PCI. Increased atorvastatin from 40mg  to 80mg  daily and patient will try to limit frying fish and chicken. TG also elevated at 492mg /dL above goal 150mg /dL. Starting fenofibrate 48mg  once daily WF (lower dose since CrCl <60). Also discussed dietary changes extensively regarding excessive sugar consumption - patient will  switch to sugar-free icees and drinks - see patient instructions for details. Will try to start walking more with his wife as well. F/u lipid panel and clinic visit in 3 months.   Megan E. Supple, PharmD [facility] [address] [place], [place] [address] Phone: [phone]; Fax: [phone] 10/28/2014 10:37  AM

## 2014-10-28 NOTE — Patient Instructions (Signed)
It was nice to see you in clinic today. We will work on lowering your triglycerides and your LDL.  To help lower your triglycerides - try to cut back on the sugar. For your frozen ice - look for a sugar free alternative. If you cannot find one, try to limit your daily serving to 1 icee. Instead of buying regular gatorade, look for the vitamin water zero - these have more nutrients and no sugar in them. Watch the portion size of honey that you use every day. I sent over a new prescription for fenofibrate to start taking once a day with food to help lower your triglycerides as well.  To help lower your LDL - try to bake or grill more of your fish and chicken instead of frying it. I also increased the dose of your Lipitor (atorvastatin) from 40mg  to 80mg  once daily.   We will follow up with a lipid panel in 3 months on Monday, January 9th. Lab opens at 7:30 in the morning, come in any time after for fasting blood work. I will see you in clinic the next day on Tuesday, January 10th at 8:30am to talk about your cholesterol.   Call Nicki Gracy in clinic if you have any questions at [phone]

## 2014-11-11 ENCOUNTER — Ambulatory Visit: Payer: Medicare Other | Admitting: Neurology

## 2014-11-12 ENCOUNTER — Encounter: Payer: Self-pay | Admitting: Neurology

## 2014-11-14 ENCOUNTER — Telehealth: Payer: Self-pay | Admitting: Cardiovascular Disease

## 2014-11-14 NOTE — Telephone Encounter (Signed)
New Message       [name] wife calling stating that she has some questions about pt's care. Please call back and advise.

## 2014-11-14 NOTE — Telephone Encounter (Signed)
Spoke with patient's wife, per his ok. She states he feels horrible.  He can't breath secondary to congestion and bad cough.  For the last 2 months he has had a bad sinus infection/head cold and been on 2 "powerful" abx. She is calling cause she isn't sure if this is a primary or cardiology concern.  States that she did call PCP and left message to call back to discuss same concerns above. Informed her that it was correct to have called PCP.  Explained that this sounds more related to recent sinus infection and pt's hx and not heart.  I did ask that they call back if PCP feels like they should be evaluated by cardiology. She agrees with this plan and thought that maybe Dr Harrington Challenger would be who he should see first anyway. She is aware I will forward to Christen Bame, Dr. Elmarie Shiley nurse, for her FYI.  She would like Sharyn Lull to call & check in with them next week.

## 2014-11-27 ENCOUNTER — Ambulatory Visit (INDEPENDENT_AMBULATORY_CARE_PROVIDER_SITE_OTHER): Payer: BLUE CROSS/BLUE SHIELD | Admitting: Internal Medicine

## 2014-11-27 DIAGNOSIS — Z5181 Encounter for therapeutic drug level monitoring: Secondary | ICD-10-CM

## 2014-11-27 LAB — POCT INR: INR: 3.4

## 2014-12-03 ENCOUNTER — Encounter: Payer: Self-pay | Admitting: Cardiovascular Disease

## 2014-12-03 ENCOUNTER — Ambulatory Visit (INDEPENDENT_AMBULATORY_CARE_PROVIDER_SITE_OTHER): Payer: BLUE CROSS/BLUE SHIELD | Admitting: Cardiovascular Disease

## 2014-12-03 VITALS — BP 130/86 | HR 89 | Ht 70.0 in | Wt 215.0 lb

## 2014-12-03 DIAGNOSIS — I2510 Atherosclerotic heart disease of native coronary artery without angina pectoris: Secondary | ICD-10-CM | POA: Diagnosis not present

## 2014-12-03 DIAGNOSIS — R0602 Shortness of breath: Secondary | ICD-10-CM

## 2014-12-03 NOTE — Patient Instructions (Signed)
Medication Instructions:  Your physician recommends that you continue on your current medications as directed. Please refer to the Current Medication list given to you today.   Labwork: TODAY - BNP   Testing/Procedures: Your physician has requested that you have a lexiscan myoview. For further information please visit [email or web address]. Please follow instruction sheet, as given.   Follow-Up: Your physician wants you to follow-up in: 6 months with [name].  You will receive a reminder letter in the mail two months in advance. If you don't receive a letter, please call our office to schedule the follow-up appointment.   If you need a refill on your cardiac medications before your next appointment, please call your pharmacy.   Thank you for choosing [facility]! Christen Bame, RN [phone]

## 2014-12-03 NOTE — Progress Notes (Signed)
 Patient Name: [name] Date of Encounter: 12/03/2014  Primary Care Provider:   Ross, Alan, MD Primary Cardiologist:  P. [name], MD   Chief Complaint  74-year-old male who presents for hospital follow-up related to recent admission for bilateral PE and right lower extremity DVT.  Past Medical History   Past Medical History  Diagnosis Date  . Chronic diastolic CHF (congestive heart failure) (HCC)     a. His initial ejection fraction was between 30 and 35%;  b. 04/2013 Echo: EF 50-55% Gr1 DD, mild-mod MR;  c. 04/2014 Echo: EF 60-65%, Gr 1 DD, triv TR.  . Thyroid disease   . Pulmonary HTN (HCC)   . Hypertension   . Addison's disease (HCC)   . Hypogonadism male   . CKD (chronic kidney disease), stage III   . History of aortic insufficiency   . Hx of cardiovascular stress test     a. ETT-Myoview 7/14:  Low risk, inferior defect consistent with thinning, no ischemia, normal wall motion, EF 54%  . DVT (deep venous thrombosis) (HCC)     a. 02/2013 superficial thrombophlebitis --> Xarelto later d/c'd 2/2 GIB;  b. 04/2014 Acute Right PT, Peroneal, Popliteal, Femoral, and common femoral DVT-->coumadin  . GERD (gastroesophageal reflux disease)   . H/O hiatal hernia   . Hepatitis     a. 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  . Daily headache   . Chronic back pain   . Peptic ulcer disease   . On home oxygen therapy     a. added 04/2014 in setting of PE.  . Acute lower GI bleeding     a. admitted to [facility] 04/11/2013;  b. 04/2014 EGD: 1.  gastric erosions, mild prox gastritis and bulbar duodenitis->Protonix.  . Pulmonary embolism (HCC)     a. 04/2014 CTA: Bilat submassive PE ->coumadin.  . Coronary artery disease     a. 07/2014 - s/p difficult PCI - had pressure wire analysis of LAD s/p DES in mid segment c/b wire-induced dissection in the distal segment of the LAD which was treated with repeated balloon inflations. Also had DES to rPDA - again had either spasm distal to the stent placement or an edge dissection  but the vessel was small in that area; procedure aborted.  . Obesity   . Anemia    Past Surgical History  Procedure Laterality Date  . Us echocardiography  04/09/2010    EF 60-65%  . Tonsillectomy  1940's  . Appendectomy  1950's  . Cholecystectomy  ~ 1965  . Esophagogastroduodenoscopy N/A 04/11/2013    Procedure: ESOPHAGOGASTRODUODENOSCOPY (EGD);  Surgeon: John C Hayes, MD;  Location: [facility] ENDOSCOPY;  Service: Endoscopy;  Laterality: N/A;  . Esophagogastroduodenoscopy N/A 05/13/2014    Procedure: ESOPHAGOGASTRODUODENOSCOPY (EGD);  Surgeon: William Outlaw, MD;  Location: [facility] ENDOSCOPY;  Service: Endoscopy;  Laterality: N/A;  . Cardiac catheterization  06/26/2008    EF 30-35%  . Cardiac catheterization N/A 08/20/2014    Procedure: Right/Left Heart Cath and Coronary Angiography;  Surgeon: Daniel R Bensimhon, MD;  Location: [facility] INVASIVE CV LAB;  Service: Cardiovascular;  Laterality: N/A;  . Cardiac catheterization N/A 08/21/2014    Procedure: Coronary Stent Intervention;  Surgeon: Muhammad A Arida, MD;  Location: [facility] INVASIVE CV LAB;  Service: Cardiovascular;  Laterality: N/A;    Allergies  No Known Allergies  HPI He was seen by Chris Berge, NP in April, 2016 - note is as below  74-year-old male with the above complex problem list. I last saw him   in clinic about 2 weeks ago, at which time he reported a 4 week history of dyspnea and lower extremity edema, which was initially treated as an upper respiratory infection. He did have evidence of volume overload on exam and we increased his Lasix. He was seen 3 days later secondary to ongoing dyspnea despite diuresis. D-dimer was drawn and was elevated at 4. He was referred to the hospital for CT angiography. This was performed early on April 16 and revealed bilateral submassive pulmonary emboli. He was admitted to medicine and placed on heparin. Radical care in interventional radiology were consulted and did not feel that he met criteria for TPA. He was  hemodynamically stable throughout his hospitalization and converted to Coumadin. Prior to this, he was seen by GI given prior history of GI bleeding and an EGD was performed on April 18 and showed gastric erosions without evidence of active bleeding. PPI therapy was prescribed.  Since his discharge, he has continued to have dyspnea with minimal exertion. He has been on Lovenox bridging and finish his last Lovenox injection yesterday morning. He has not yet had her INR checked. He is not having chest pain, palpitations, PND, orthopnea, dizziness, syncope, edema, or early satiety. Previous symptoms of volume overload including increasing abdominal girth and edema have more or less resolved.   August 01, 2014:    Gilles returns for evaluation of his chronic shortness of breath and leg edema. The cardiopulmonary stress test revealed mild functional impairment suggestive of a circulatory issue. He returns today to discuss right left heart catheterization.  Still has shortness of breath. Unable to do anything without gasping for air - doing ADLs cause significant dyspnea . Has had progressive DOE over the past month or so    Sept. 19, 2016:  Zedric had PCI of the mid - distal LAD a month ago. mid to distal LAD.  2.75 x 18 mm resolute integrity drug-eluting stent which was deployed to 14 atm. This was postdilated with a 3.0 x 15 mm noncompliant balloon. His right-sided pressures were completely normal.  Complains of some mild swelling of his right ankle. Thinks that he is able to do more exercise and recover quicker.  Breathing is ok   Nov. 8, 2016: No CP , Still short of breath , seems to be getting a little worse, similar to prior to his stenting .  Getting some exercise  Home Medications  Current Outpatient Prescriptions  Medication Sig Dispense Refill  . atorvastatin (LIPITOR) 80 MG tablet Take 1 tablet (80 mg total) by mouth daily. 90 tablet 3  . carvedilol (COREG) 6.25 MG tablet Take 6.25  mg by mouth 2 (two) times daily with a meal.    . Cholecalciferol (VITAMIN D3 PO) Take 1,000 capsules by mouth 2 (two) times a week.     . clopidogrel (PLAVIX) 75 MG tablet Take 1 tablet (75 mg total) by mouth daily. 30 tablet 11  . fenofibrate (TRICOR) 48 MG tablet Take 1 tablet (48 mg total) by mouth daily. 30 tablet 11  . furosemide (LASIX) 40 MG tablet Take 0.5 tablets (20 mg total) by mouth every other day.    . gabapentin (NEURONTIN) 600 MG tablet Take 600 mg by mouth daily.    . hydrocortisone (CORTEF) 20 MG tablet Take 20 mg by mouth 2 (two) times daily.     . isosorbide mononitrate (IMDUR) 30 MG 24 hr tablet Take 0.5 tablets (15 mg total) by mouth daily.    . lisinopril (PRINIVIL,ZESTRIL)   10 MG tablet Take 5 mg by mouth daily.    . metoCLOPramide (REGLAN) 5 MG tablet Take 5 mg by mouth daily.     . nitroGLYCERIN (NITROSTAT) 0.4 MG SL tablet Place 1 tablet (0.4 mg total) under the tongue every 5 (five) minutes as needed for chest pain (up to 3 doses). 25 tablet 3  . pantoprazole (PROTONIX) 40 MG tablet Take 1 tablet (40 mg total) by mouth daily. 30 tablet 0  . potassium chloride (K-DUR,KLOR-CON) 10 MEQ tablet Take 1 tablet (10 mEq total) by mouth every other day.    Marland Kitchen PROAIR HFA 108 (90 BASE) MCG/ACT inhaler INHALE 2 PUFFS INTO THE LUNGS EVERY 6 (SIX) HOURS AS NEEDED FOR WHEEZING OR SHORTNESS OF BREATH. 8.5 Inhaler 2  . thyroid (ARMOUR) 60 MG tablet Take 60 mg by mouth daily.     . traMADol (ULTRAM) 50 MG tablet Take 100 mg by mouth 3 (three) times daily.     . traZODone (DESYREL) 100 MG tablet Take 100 mg by mouth every evening.   2  . warfarin (COUMADIN) 3 MG tablet Continue taking 1/2 tablet everyday except 1 tablet on Tuesdays, Thursdays and Saturdays.    Marland Kitchen warfarin (COUMADIN) 3 MG tablet TAKE AS DIRECTED BY COUMADIN CLINIC 30 tablet 3   No current facility-administered medications for this visit.                                                                                                                         Review of Systems  As above, he continues to experience DOE.  He denies chest pain, palpitations, pnd, orthopnea, n, v, dizziness, syncope, edema, weight gain, or early satiety. All other systems reviewed and are otherwise negative except as noted above.  Physical Exam  VS:  BP 130/86 mmHg  Pulse 89  Ht 5' 10" (1.778 m)  Wt 215 lb (97.523 kg)  BMI 30.85 kg/m2  SpO2 96% , BMI Body mass index is 30.85 kg/(m^2). GEN: Well nourished, well developed, in no acute distress. HEENT: normal. Neck: Supple, no JVD, carotid bruits, or masses. Cardiac: RRR, no murmurs, rubs, or gallops. No clubbing, cyanosis.  Trace bilat LE edema.  Radials/DP/PT 2+ and equal bilaterally.  His posterior tibialis pulses is 2+ bilateral  Respiratory:  Respirations regular and unlabored, clear to auscultation bilaterally. GI: Soft, nontender, nondistended, BS + x 4. MS: no deformity or atrophy. Skin: warm and dry, no rash. Neuro:  Strength and sensation are intact. Psych: Normal affect.   Assessment & Plan  1.  Bilateral pulmonary emboli/right lower extremity DVT:    On warfarin,    2. Chronic diastolic congestive heart failure: Volume appears stable today. Heart rate and blood pressure stable. Continue Lasix at current dose. spirinolactone was stopped by his nephrologist.  He did not notice any worsening of his dyspnea.   3. Hypertension: Stable on current regimen including blocker, ACE inhibitor, and calcium channel blocker therapy.     4. CAD -  s/p PCI .   No angina .  He continues to have significant DOE.  He relates these symptoms to being similar to prior to his stenting .   Will get a Lexiscan myoview to evaluate his dyspnea.    5.  Peripheral neuropathy:  He has excellent distal pulses in his feet. The burning that he is having in his feet and toes are  not due to peripheral arterial disease.  I suspect that he has peripheral neuropathy. He should talk  to his general medical doctor about that. He does not have diabetes.  [name], Wonda Cheng, MD  12/03/2014 4:45 PM    [address],  [place], [place]  [address] Pager [phone] Phone: [phone]; Fax: [phone]

## 2014-12-04 LAB — BRAIN NATRIURETIC PEPTIDE: BRAIN NATRIURETIC PEPTIDE: 45.8 pg/mL (ref 0.0–100.0)

## 2014-12-05 ENCOUNTER — Other Ambulatory Visit: Payer: Self-pay

## 2014-12-05 MED ORDER — CARVEDILOL 6.25 MG PO TABS: 6.2500 mg | ORAL_TABLET | Freq: Two times a day (BID) | ORAL | Status: DC

## 2014-12-10 ENCOUNTER — Telehealth (HOSPITAL_COMMUNITY): Payer: Self-pay

## 2014-12-10 ENCOUNTER — Telehealth (HOSPITAL_COMMUNITY): Payer: Self-pay | Admitting: Radiology

## 2014-12-10 NOTE — Telephone Encounter (Signed)
Left message on voicemail in reference to upcoming appointment scheduled for 12-13-2014. Phone number given for a call back so details instructions can be given. [name] A   

## 2014-12-10 NOTE — Telephone Encounter (Signed)
Patient given detailed instructions per Myocardial Perfusion Study Information Sheet for the test on 12/13/2014 at 9:15. Patient notified to arrive 15 minutes early and that it is imperative to arrive on time for appointment to keep from having the test rescheduled.  If you need to cancel or reschedule your appointment, please call the office within 24 hours of your appointment. Failure to do so may result in a cancellation of your appointment, and a $50 no show fee. Patient verbalized understanding.EHK

## 2014-12-13 ENCOUNTER — Ambulatory Visit (HOSPITAL_COMMUNITY): Payer: BLUE CROSS/BLUE SHIELD | Attending: Cardiology

## 2014-12-13 ENCOUNTER — Ambulatory Visit (INDEPENDENT_AMBULATORY_CARE_PROVIDER_SITE_OTHER): Payer: BLUE CROSS/BLUE SHIELD | Admitting: *Deleted

## 2014-12-13 DIAGNOSIS — I82401 Acute embolism and thrombosis of unspecified deep veins of right lower extremity: Secondary | ICD-10-CM | POA: Diagnosis not present

## 2014-12-13 DIAGNOSIS — R0602 Shortness of breath: Secondary | ICD-10-CM | POA: Diagnosis not present

## 2014-12-13 DIAGNOSIS — I2699 Other pulmonary embolism without acute cor pulmonale: Secondary | ICD-10-CM | POA: Diagnosis not present

## 2014-12-13 DIAGNOSIS — I2510 Atherosclerotic heart disease of native coronary artery without angina pectoris: Secondary | ICD-10-CM | POA: Insufficient documentation

## 2014-12-13 DIAGNOSIS — Z5181 Encounter for therapeutic drug level monitoring: Secondary | ICD-10-CM

## 2014-12-13 LAB — MYOCARDIAL PERFUSION IMAGING
CHL CUP NUCLEAR SRS: 1
CHL CUP NUCLEAR SSS: 2
CSEPPHR: 93 {beats}/min
LHR: 0.31
LV dias vol: 119 mL
LVSYSVOL: 63 mL
NUC STRESS TID: 1.02
Rest HR: 78 {beats}/min
SDS: 1

## 2014-12-13 LAB — POCT INR: INR: 3.4

## 2014-12-13 MED ORDER — REGADENOSON 0.4 MG/5ML IV SOLN
0.4000 mg | Freq: Once | INTRAVENOUS | Status: AC
  Administered 2014-12-13: 0.4 mg via INTRAVENOUS

## 2014-12-13 MED ORDER — TECHNETIUM TC 99M SESTAMIBI GENERIC - CARDIOLITE
10.2000 | Freq: Once | INTRAVENOUS | Status: AC | PRN
  Administered 2014-12-13: 10 via INTRAVENOUS

## 2014-12-13 MED ORDER — TECHNETIUM TC 99M SESTAMIBI GENERIC - CARDIOLITE
32.1000 | Freq: Once | INTRAVENOUS | Status: AC | PRN
  Administered 2014-12-13: 32.1 via INTRAVENOUS

## 2014-12-23 ENCOUNTER — Telehealth: Payer: Self-pay | Admitting: Cardiovascular Disease

## 2014-12-23 ENCOUNTER — Ambulatory Visit: Payer: BLUE CROSS/BLUE SHIELD | Admitting: Emergency Medicine

## 2014-12-23 NOTE — Telephone Encounter (Signed)
New Message  Pt wife calling to speak w/ RN- pt has been taking full dosage of furosemide and she stated that it does has not helped. Pt calling to see what to do going forward. Please call back and discuss.

## 2014-12-23 NOTE — Telephone Encounter (Signed)
Spoke with patient's wife, Carlyon Shadow, who states she gave patient full dose of Lasix 40 mg for a few days per advice from [name] to see if it helps to improve his breathing.  Darlene states patient reports he felt like his breathing was worse after this therapy.  She wanted to give [place] the update and let us know that she is going to call Dr. Lamonte Sakai for a pulmonary evaluation as [name] has advised that there is not a cardiac explanation for patient's SOB.  I advised her to call back with questions or concerns.  She verbalized understanding and agreement and thanked me for our help.

## 2014-12-23 NOTE — Telephone Encounter (Signed)
[facility]  Pt wife calling to speak w/ RN- pt has been taking full dosage of

## 2014-12-23 NOTE — Telephone Encounter (Signed)
Agree with note from Michelle Swinyer, RN  

## 2014-12-27 ENCOUNTER — Encounter: Payer: Self-pay | Admitting: Emergency Medicine

## 2014-12-27 ENCOUNTER — Ambulatory Visit (INDEPENDENT_AMBULATORY_CARE_PROVIDER_SITE_OTHER): Payer: BLUE CROSS/BLUE SHIELD | Admitting: Emergency Medicine

## 2014-12-27 ENCOUNTER — Ambulatory Visit (INDEPENDENT_AMBULATORY_CARE_PROVIDER_SITE_OTHER): Payer: BLUE CROSS/BLUE SHIELD | Admitting: *Deleted

## 2014-12-27 VITALS — BP 150/90 | HR 85 | Ht 69.0 in | Wt 215.0 lb

## 2014-12-27 DIAGNOSIS — Z5181 Encounter for therapeutic drug level monitoring: Secondary | ICD-10-CM | POA: Diagnosis not present

## 2014-12-27 DIAGNOSIS — I2699 Other pulmonary embolism without acute cor pulmonale: Secondary | ICD-10-CM

## 2014-12-27 DIAGNOSIS — I5032 Chronic diastolic (congestive) heart failure: Secondary | ICD-10-CM | POA: Diagnosis not present

## 2014-12-27 DIAGNOSIS — I82401 Acute embolism and thrombosis of unspecified deep veins of right lower extremity: Secondary | ICD-10-CM | POA: Diagnosis not present

## 2014-12-27 DIAGNOSIS — R0602 Shortness of breath: Secondary | ICD-10-CM | POA: Diagnosis not present

## 2014-12-27 LAB — POCT INR: INR: 2.1

## 2014-12-27 NOTE — Progress Notes (Signed)
Subjective:    Patient ID: [name], male    DOB: October 26, 1940, 74 y.o.   MRN: [id number]  74 yo man, never smoker, hx HTN, diastolic dysfxn, Addison's dz, B LE DVT, GERD / PUD / GIB with anemia (Hgb 9.4-10.0). Xarelto was stopped due to the GIB (finished 5-6 weeks). 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 Better on last TTE 4/'15). Referred today for exertional SOB. No hx asthma or childhood PNA's.   He is dyspneic with exertion, progressive over several years. He has a very hoarse voice, occasionally loses his voice. He feels a weakness in his legs when he carries groceries. Sometimes hears wheezing, doesn't cough. Denies CP. He is feels GERD sx, on protonix. He is on an ACE-i.   ROV 06/27/13 -- follows for dyspnea. He has had V/q > low prob for chronic PE, Full PFT done 06/22/13 >> mild AFL, normal volumes, decrease DLCO that corrects for Va.  His breathing is about the same. No cough but still has a hoarse voice. He does have some increased GERD sx.   [facility] follow up 04/27/14 ---  Patient returns for a post hospital follow-up Patient was admitted April 15 through April 18 for acute pulmonary embolus and DVT. Patient presented with shortness of breath. CT chest showed acute bilateral pulmonary emboli. Echo showed no significant right heart strain. Venous Doppler showed a new right lower extremity DVT. Patient has a history of previous DVT 1 year ago. He did have a GI bleed while on Xarelto. This was felt secondary to associated nostril use with upper GI ulceration. Patient was seen by GI and underwent an upper endoscopy that showed gastric erosions mild proximal gastritis . He was recommended to use Protonix daily. Patient was treated with heparin with transition to Lovenox and Coumadin bridge. He was set up for oxygen with activity. However, has not been using this since discharge. Patient says that he is improved with decreased shortness of breath. However, continues to get  winded intermittently, especially with activity. He denies any signs of bleeding. He is currently on Lovenox and Coumadin. He denies any chest pain, palpitations, orthopnea, PND, extremity weakness, slurred speech, visual or speech changes, amoxicillin, hematuria, bloody stools. He does complain he has been having intermittent headaches, but denies any neurological changes. Patient has an appointment with his primary care physician today. We discussed that he will need lab work including his INR and Coumadin follow-ups at the Coumadin clinic at [facility]. Says he has a family history of blood clots, discussed referral to hematology.  ROV 06/06/14 -- follow-up visit for DVT and pulmonary embolism diagnosed in April 2016. He also has mild obstruction on pulmonary function testing, a history of diastolic and systolic CHF.  He has been supertherapeutic on coumadin, being adjusted. No current bleeding. Continues to have hoarse voice, no cough. He has been inactive lately. He is having dizziness and imbalance lately.   ROV 12/27/14 -- Follow-up visit for history of mild obstructive lung disease, nonischemic cardiomyopathy with systolic CHF, DVT and PE diagnosed April 2016. This was a second DVT as he was also treated for a clot in 2015. He has some chronic upper airway irritation and hoarse voice. Presents today reporting that his coumadin has been influenced by a recent sinusitis and abx use. He was HA, sinus pain, drainage. He still has hoarse voice. He notes heavy breathing with walking, dressing. He has tried [employer], without much benefit. His wt has been stable. He  remains busy, active, but notes that he is more dyspneic. He is having UA noise. Having more GERD sx lately. His Addison's meds have been stable.   ROS:   As per the HPI  EXAM:  Filed Vitals:   12/27/14 0933  BP: 150/90  Pulse: 85  Height: 5\' 9"  (1.753 m)  Weight: 215 lb (97.523 kg)  SpO2: 98%    Gen: Pleasant, well-nourished, in no  distress,  normal affect  ENT: No lesions,  mouth clear,  oropharynx clear, no postnasal drip, hoarseg  Neck: No JVD, no TMG, no carotid bruits  Lungs: No use of accessory muscles, clear without rales or rhonchi  Cardiovascular: RRR, heart sounds normal, no murmur or gallops, tr peripheral edema  Musculoskeletal: No deformities, no cyanosis or clubbing  Neuro: alert, non focal  Skin: Warm, no lesions or rashes      Assessment & Plan:  Chronic diastolic CHF (congestive heart failure) Continue systemic anticoagulation w coumadin, he will need anticoagulation for life given his hx multiple DVT  SOB (shortness of breath)  Etiology unclear and this may be multifactorial. Certainly his Addison's could be a contributor although this has been stable. He does have mild obstruction on spirometry, but I suspect deconditioning is most important factor here. We will do a trial of symbicort to see if he benefits. If so then can continue.

## 2014-12-27 NOTE — Patient Instructions (Signed)
We will start Symbicort 2 puffs twice a day for the next 3-4 weeks to see if you benefit from this.  Please call our office to let us know if the medicine helps you. If so we will continue this.  Continue your coumadin  Follow with Cardiology and Endocrinology as planned.  Follow with Dr Lamonte Sakai in 3 months or sooner if you have any problems.

## 2014-12-27 NOTE — Assessment & Plan Note (Signed)
Etiology unclear and this may be multifactorial. Certainly his Addison's could be a contributor although this has been stable. He does have mild obstruction on spirometry, but I suspect deconditioning is most important factor here. We will do a trial of symbicort to see if he benefits. If so then can continue.

## 2014-12-27 NOTE — Assessment & Plan Note (Signed)
Continue systemic anticoagulation w coumadin, he will need anticoagulation for life given his hx multiple DVT

## 2015-01-02 ENCOUNTER — Other Ambulatory Visit: Payer: Self-pay | Admitting: Nurse Practitioner

## 2015-01-03 NOTE — Telephone Encounter (Signed)
Please advise on what current sig should be. Thanks, MI

## 2015-01-03 NOTE — Telephone Encounter (Signed)
Patient was to return to original dose of 20 mg every other day.  Thank you.

## 2015-01-10 ENCOUNTER — Ambulatory Visit (INDEPENDENT_AMBULATORY_CARE_PROVIDER_SITE_OTHER): Payer: BLUE CROSS/BLUE SHIELD | Admitting: Pharmacist

## 2015-01-10 DIAGNOSIS — Z5181 Encounter for therapeutic drug level monitoring: Secondary | ICD-10-CM | POA: Diagnosis not present

## 2015-01-10 DIAGNOSIS — I82401 Acute embolism and thrombosis of unspecified deep veins of right lower extremity: Secondary | ICD-10-CM | POA: Diagnosis not present

## 2015-01-10 DIAGNOSIS — I2699 Other pulmonary embolism without acute cor pulmonale: Secondary | ICD-10-CM | POA: Diagnosis not present

## 2015-01-10 LAB — POCT INR: INR: 2.1

## 2015-01-12 ENCOUNTER — Other Ambulatory Visit: Payer: Self-pay | Admitting: Cardiovascular Disease

## 2015-01-21 ENCOUNTER — Telehealth: Payer: Self-pay | Admitting: Emergency Medicine

## 2015-01-21 NOTE — Telephone Encounter (Signed)
Per 12/27/14 OV: Patient Instructions       We will start Symbicort 2 puffs twice a day for the next 3-4 weeks to see if you benefit from this.   Please call our office to let us know if the medicine helps you. If so we will continue this.    ---  Called spoke with spouse. She reports pt will start on 2nd box of symbicort today. He can't tell it is helping him at all. He has been doing 2 puffs BID. He wants to know if anything else he can try. Spouse aware RB not back until 02/03/15. He was fine with this. Please advise RB thanks

## 2015-01-24 NOTE — Telephone Encounter (Signed)
Awaiting RB's return to office 

## 2015-01-29 NOTE — Telephone Encounter (Signed)
Spoke with pt, states that his Symbicort is not working for pt.  Pt states he still feels like he "isn't getting enough air".   S/s worse in mornings, feels better as the day progresses. Pt requesting a different inhaler prior to appt on 1/13.   Pt uses [facility] on [address].    RB please advise on alternative inhaler.  Thanks.

## 2015-01-29 NOTE — Telephone Encounter (Signed)
Pt calling stating that med given to try is not helping, and is wanting an sooner appointment to see RB please advise.[name]

## 2015-01-29 NOTE — Telephone Encounter (Signed)
Made pt appointment for 1/13@2 :67, still wanting to speak to nurse about the med issue.[name]

## 2015-02-03 ENCOUNTER — Other Ambulatory Visit: Payer: BLUE CROSS/BLUE SHIELD

## 2015-02-04 ENCOUNTER — Other Ambulatory Visit (INDEPENDENT_AMBULATORY_CARE_PROVIDER_SITE_OTHER): Payer: BLUE CROSS/BLUE SHIELD

## 2015-02-04 ENCOUNTER — Ambulatory Visit: Payer: BLUE CROSS/BLUE SHIELD | Admitting: Pharmacist

## 2015-02-04 DIAGNOSIS — I10 Essential (primary) hypertension: Secondary | ICD-10-CM | POA: Diagnosis not present

## 2015-02-04 NOTE — Telephone Encounter (Signed)
The fact that the symbicort hasn't changed his breathing suggests that other factors are influencing his shortness of breath besides obstructive lung disease, including his addison's disease, deconditioning, etc. If the symbicort hasn't helped, I doubt that he will get benefit from an alternative since they all work in the same way. He has used albuterol in the past, ? Whether he has benefited.

## 2015-02-04 NOTE — Telephone Encounter (Signed)
Called and spoke with patient. Informed him of RB's recs. Pt stated him or his wife do not remember him using albuterol in the past and if RB feels it could help would like to try albuterol. I explained to him that I would send a message to RB and once his recs were received would return his call. Pt voiced understanding and had no further questions.   RB please advise

## 2015-02-04 NOTE — Telephone Encounter (Signed)
RB - please advise. 

## 2015-02-06 NOTE — Telephone Encounter (Signed)
Yes have him retry it and keep track of the effects so we can review >  Albuterol 2 puffs up to every 4 hours if needed for shortness of breath.

## 2015-02-07 ENCOUNTER — Ambulatory Visit: Payer: BLUE CROSS/BLUE SHIELD | Admitting: Pharmacist

## 2015-02-07 ENCOUNTER — Ambulatory Visit (INDEPENDENT_AMBULATORY_CARE_PROVIDER_SITE_OTHER): Payer: BLUE CROSS/BLUE SHIELD | Admitting: Pharmacist

## 2015-02-07 ENCOUNTER — Ambulatory Visit (INDEPENDENT_AMBULATORY_CARE_PROVIDER_SITE_OTHER): Payer: BLUE CROSS/BLUE SHIELD | Admitting: Emergency Medicine

## 2015-02-07 ENCOUNTER — Encounter: Payer: Self-pay | Admitting: Emergency Medicine

## 2015-02-07 VITALS — BP 126/80 | HR 89 | Ht 71.0 in | Wt 216.0 lb

## 2015-02-07 DIAGNOSIS — I2510 Atherosclerotic heart disease of native coronary artery without angina pectoris: Secondary | ICD-10-CM | POA: Diagnosis not present

## 2015-02-07 DIAGNOSIS — R0602 Shortness of breath: Secondary | ICD-10-CM

## 2015-02-07 DIAGNOSIS — R0600 Dyspnea, unspecified: Secondary | ICD-10-CM

## 2015-02-07 DIAGNOSIS — Z5181 Encounter for therapeutic drug level monitoring: Secondary | ICD-10-CM | POA: Diagnosis not present

## 2015-02-07 DIAGNOSIS — I2782 Chronic pulmonary embolism: Secondary | ICD-10-CM | POA: Diagnosis not present

## 2015-02-07 DIAGNOSIS — I82401 Acute embolism and thrombosis of unspecified deep veins of right lower extremity: Secondary | ICD-10-CM | POA: Diagnosis not present

## 2015-02-07 DIAGNOSIS — I2699 Other pulmonary embolism without acute cor pulmonale: Secondary | ICD-10-CM | POA: Diagnosis not present

## 2015-02-07 LAB — POCT INR: INR: 1.9

## 2015-02-07 NOTE — Assessment & Plan Note (Addendum)
Suspect that this is multifactorial, with a contribution of deconditioning. Little evidence to support UA disease impacting his exercise tolerance. He describes difficulty taking deep breaths, restriction. Will check a cardiopulmonary exercise test to help determine deconditioning vs other etiology. Will also check a sniff test. Failure to respond to symbicort argues against significant COPD although he does have some mild obstruction on his spirometry.

## 2015-02-07 NOTE — Telephone Encounter (Signed)
Attempted to contact pt. No answer, no option to leave a message. 

## 2015-02-07 NOTE — Patient Instructions (Signed)
We will perform a sniff test.  We will perform a cardiopulmonary exercise test.  Please stop Symbicort Use albuterol (ProAir) 2 puffs as needed for shortness of breath Follow with Dr Lamonte Sakai after the cardiopulm test to review, or sooner if you have any problems.

## 2015-02-07 NOTE — Telephone Encounter (Signed)
Pt aware that this will be discussed this afternoon. Nothing further needed.

## 2015-02-07 NOTE — Assessment & Plan Note (Signed)
Stable at this time. Has been evaluated recently by dobuta stress test and then L heart cath

## 2015-02-07 NOTE — Assessment & Plan Note (Signed)
On chronic anticoagulation. 

## 2015-02-07 NOTE — Telephone Encounter (Signed)
atc pt, line rang several times and then asked me for "remote access code". Wcb.

## 2015-02-07 NOTE — Progress Notes (Signed)
Subjective:    Patient ID: [name], male    DOB: 09/02/1940, 75 y.o.   MRN: [id number]  75 yo man, never smoker, hx HTN, diastolic dysfxn, Addison's dz, B LE DVT, GERD / PUD / GIB with anemia (Hgb 9.4-10.0). Xarelto was stopped due to the GIB (finished 5-6 weeks). 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 Better on last TTE 4/'15). Referred today for exertional SOB. No hx asthma or childhood PNA's.   He is dyspneic with exertion, progressive over several years. He has a very hoarse voice, occasionally loses his voice. He feels a weakness in his legs when he carries groceries. Sometimes hears wheezing, doesn't cough. Denies CP. He is feels GERD sx, on protonix. He is on an ACE-i.   ROV 06/27/13 -- follows for dyspnea. He has had V/q > low prob for chronic PE, Full PFT done 06/22/13 >> mild AFL, normal volumes, decrease DLCO that corrects for Va.  His breathing is about the same. No cough but still has a hoarse voice. He does have some increased GERD sx.   [facility] follow up 04/27/14 ---  Patient returns for a post hospital follow-up Patient was admitted April 15 through April 18 for acute pulmonary embolus and DVT. Patient presented with shortness of breath. CT chest showed acute bilateral pulmonary emboli. Echo showed no significant right heart strain. Venous Doppler showed a new right lower extremity DVT. Patient has a history of previous DVT 1 year ago. He did have a GI bleed while on Xarelto. This was felt secondary to associated nostril use with upper GI ulceration. Patient was seen by GI and underwent an upper endoscopy that showed gastric erosions mild proximal gastritis . He was recommended to use Protonix daily. Patient was treated with heparin with transition to Lovenox and Coumadin bridge. He was set up for oxygen with activity. However, has not been using this since discharge. Patient says that he is improved with decreased shortness of breath. However, continues to get  winded intermittently, especially with activity. He denies any signs of bleeding. He is currently on Lovenox and Coumadin. He denies any chest pain, palpitations, orthopnea, PND, extremity weakness, slurred speech, visual or speech changes, amoxicillin, hematuria, bloody stools. He does complain he has been having intermittent headaches, but denies any neurological changes. Patient has an appointment with his primary care physician today. We discussed that he will need lab work including his INR and Coumadin follow-ups at the Coumadin clinic at [facility]. Says he has a family history of blood clots, discussed referral to hematology.  ROV 06/06/14 -- follow-up visit for DVT and pulmonary embolism diagnosed in April 2016. He also has mild obstruction on pulmonary function testing, a history of diastolic and systolic CHF.  He has been supertherapeutic on coumadin, being adjusted. No current bleeding. Continues to have hoarse voice, no cough. He has been inactive lately. He is having dizziness and imbalance lately.   ROV 12/27/14 -- Follow-up visit for history of mild obstructive lung disease, nonischemic cardiomyopathy with systolic CHF, DVT and PE diagnosed April 2016. This was a second DVT as he was also treated for a clot in 2015. He has some chronic upper airway irritation and hoarse voice. Presents today reporting that his coumadin has been influenced by a recent sinusitis and abx use. He was HA, sinus pain, drainage. He still has hoarse voice. He notes heavy breathing with walking, dressing. He has tried Ship broker, without much benefit. His wt has been stable. He  remains busy, active, but notes that he is more dyspneic. He is having UA noise. Having more GERD sx lately. His Addison's meds have been stable.   ROV 02/07/15 -- history of Addison's disease, mild obstructive lung disease, nonischemic cardiomyopathy with systolic CHF, DVT and PE diagnosed April 2016. This was a second DVT as he was also treated for  a clot in 2015. He has some chronic upper airway irritation and hoarse voice. Last visit we started Symbicort to see if he would benefit. He did not notice any benefit.  His largest complaint is not being able to take a deep breath. He has chronic hoarseness but does not believe he is having upper airway  obstruction.    ROS:   As per the HPI  EXAM:  Filed Vitals:   02/07/15 1434  BP: 126/80  Pulse: 89  Height: 5\' 11"  (1.803 m)  Weight: 216 lb (97.977 kg)  SpO2: 96%    Gen: Pleasant, well-nourished, in no distress,  normal affect  ENT: No lesions,  mouth clear,  oropharynx clear, no postnasal drip, hoarseg  Neck: No JVD, no TMG, no carotid bruits  Lungs: No use of accessory muscles, clear without rales or rhonchi  Cardiovascular: RRR, heart sounds normal, no murmur or gallops, tr peripheral edema  Musculoskeletal: No deformities, no cyanosis or clubbing  Neuro: alert, non focal  Skin: Warm, no lesions or rashes      Assessment & Plan:  Pulmonary embolus On chronic anticoagulation  CAD (coronary artery disease) Stable at this time. Has been evaluated recently by dobuta stress test and then L heart cath  SOB (shortness of breath) Suspect that this is multifactorial, with a contribution of deconditioning. Little evidence to support UA disease impacting his exercise tolerance. He describes difficulty taking deep breaths, restriction. Will check a cardiopulmonary exercise test to help determine deconditioning vs other etiology. Will also check a sniff test. Failure to respond to symbicort argues against significant COPD although he does have some mild obstruction on his spirometry.

## 2015-02-07 NOTE — Telephone Encounter (Signed)
Pt has appt this afternoon, please discuss this then per pt.

## 2015-02-10 ENCOUNTER — Ambulatory Visit: Payer: Medicare Other | Admitting: Neurology

## 2015-02-11 ENCOUNTER — Encounter (HOSPITAL_COMMUNITY): Payer: BLUE CROSS/BLUE SHIELD

## 2015-02-17 ENCOUNTER — Ambulatory Visit (HOSPITAL_COMMUNITY): Admission: RE | Admit: 2015-02-17 | Payer: BLUE CROSS/BLUE SHIELD | Source: Ambulatory Visit

## 2015-02-19 ENCOUNTER — Encounter (HOSPITAL_COMMUNITY): Payer: BLUE CROSS/BLUE SHIELD

## 2015-02-24 ENCOUNTER — Other Ambulatory Visit: Payer: BLUE CROSS/BLUE SHIELD

## 2015-02-26 ENCOUNTER — Telehealth: Payer: Self-pay | Admitting: Cardiovascular Disease

## 2015-02-26 NOTE — Telephone Encounter (Signed)
Pt on Coumadin for DVT and bilateral PE in April 2016 - will route to pulmonologist Dr. Lamonte Sakai to address any potential need for Lovenox bridging while pt is off Coumadin for 5 days.

## 2015-02-26 NOTE — Telephone Encounter (Signed)
I have printed this note and placed in HIM for faxing

## 2015-02-26 NOTE — Telephone Encounter (Signed)
Patient may hold coumadin for 5 days prior to back injection

## 2015-02-26 NOTE — Telephone Encounter (Signed)
New message      Request for surgical clearance:  What type of surgery is being performed? Epidural in back 1. When is this surgery scheduled?  Pending clearance  2. Are there any medications that need to be held prior to surgery and how long? Hold coumadin 5-7 days prior  3. Name of physician performing surgery? Dr Normajean Glasgow  4. What is your office phone and fax number? Fax [phone]

## 2015-02-26 NOTE — Telephone Encounter (Signed)
Patient has completed almost 9 months of therapy and I believe it would be acceptable for him to temporarily be off warfarin in order to have his procedure without an enoxaparin bridge. Thanks

## 2015-02-27 ENCOUNTER — Other Ambulatory Visit: Payer: BLUE CROSS/BLUE SHIELD | Admitting: *Deleted

## 2015-02-27 ENCOUNTER — Other Ambulatory Visit: Payer: Self-pay | Admitting: Pharmacist

## 2015-02-27 DIAGNOSIS — E785 Hyperlipidemia, unspecified: Secondary | ICD-10-CM

## 2015-02-27 LAB — HEPATIC FUNCTION PANEL
ALBUMIN: 3.8 g/dL (ref 3.6–5.1)
ALK PHOS: 49 U/L (ref 40–115)
ALT: 17 U/L (ref 9–46)
AST: 18 U/L (ref 10–35)
BILIRUBIN TOTAL: 0.2 mg/dL (ref 0.2–1.2)
Bilirubin, Direct: <0.1 mg/dL (ref <=0.2)
TOTAL PROTEIN: 6.6 g/dL (ref 6.1–8.1)

## 2015-02-27 LAB — LIPID PANEL
Cholesterol: 177 mg/dL (ref 125–200)
HDL: 41 mg/dL (ref >=40)
LDL CALC: 91 mg/dL (ref <130)
Total CHOL/HDL Ratio: 4.3 Ratio (ref <=5.0)
Triglycerides: 223 mg/dL — ABNORMAL HIGH (ref <150)
VLDL: 45 mg/dL — AB (ref <30)

## 2015-02-27 LAB — LDL CHOLESTEROL, DIRECT: Direct LDL: 96 mg/dL (ref <130)

## 2015-02-27 NOTE — Telephone Encounter (Signed)
Clearance given to hold Coumadin x5 days with no Lovenox bridge. Will fax clearance to Dr. Mina Marble at [phone].

## 2015-02-27 NOTE — Telephone Encounter (Signed)
Wife wants to know if Dr Mina Marble have contacted you for clearance for pt's surgery?

## 2015-02-28 ENCOUNTER — Ambulatory Visit (INDEPENDENT_AMBULATORY_CARE_PROVIDER_SITE_OTHER): Payer: BLUE CROSS/BLUE SHIELD | Admitting: Pharmacist

## 2015-02-28 ENCOUNTER — Ambulatory Visit: Payer: BLUE CROSS/BLUE SHIELD | Admitting: Pharmacist

## 2015-02-28 VITALS — Wt 216.4 lb

## 2015-02-28 DIAGNOSIS — E785 Hyperlipidemia, unspecified: Secondary | ICD-10-CM | POA: Diagnosis not present

## 2015-02-28 DIAGNOSIS — I2699 Other pulmonary embolism without acute cor pulmonale: Secondary | ICD-10-CM | POA: Diagnosis not present

## 2015-02-28 DIAGNOSIS — I82401 Acute embolism and thrombosis of unspecified deep veins of right lower extremity: Secondary | ICD-10-CM | POA: Diagnosis not present

## 2015-02-28 DIAGNOSIS — Z5181 Encounter for therapeutic drug level monitoring: Secondary | ICD-10-CM

## 2015-02-28 DIAGNOSIS — I2782 Chronic pulmonary embolism: Secondary | ICD-10-CM

## 2015-02-28 LAB — POCT INR: INR: 2.8

## 2015-02-28 MED ORDER — EZETIMIBE 10 MG PO TABS: 10.0000 mg | ORAL_TABLET | Freq: Every day | ORAL | Status: DC

## 2015-02-28 NOTE — Patient Instructions (Signed)
Pick up your new prescription for Zetia 10mg  to take once daily Switch over to a sugar free frozen ice alternative Continue taking Lipitor and fenofibrate for your cholesterol We will recheck your cholesterol in 3 months on Monday, May 8th Call Chaquetta Schlottman in lipid/Coumadin clinic with any questions [phone]

## 2015-02-28 NOTE — Progress Notes (Signed)
Patient ID: [name]            DOB: 04/10/1940, 75 yo                  MRN: [id number]     HPI: [name] is a 75 y.o. male patient referred to lipid clinic by [name] who presents today for follow up. PMH is significant for HLD and CAD s/p PCI, diastolic HF (prior nonischemic cardiomyopathy with systolic HF), pulmonary HTN, HTN, CKD stage 3, Addison's disease, prior DVT/PE and GI bleed while on Xarelto. He was started on fenofibrate last visit and his atorvastatin was increased to 80 mg. He presents today with his wife for follow up on his HLD and elevated TG.  Patient reports that he is tolerating his Lipitor and fenofibrate well with no issues. He reports that he switched his gatorade to a lower sugar alternative, however he admits that he is still eating 4-5 [employer] [place] ices a day (~100g of sugar total). Tg have dropped 50% since starting fenofibrate, dietary changes will bring pt to goal if he makes them.  Current Medications: atorvastatin 80 mg, fenofibrate 48 mg Intolerances: none Risk Factors: CAD s/p PCI, sex, age LDL goal: 70mg /dL, TG goal 150mg /dL  Diet: Spent an extensive amount of time discussing diet. For breakfast, patient eats oats with flax seed, 3 eggs, liver pudding, and fried grits with syrup. For lunch/dinner - lightly fried fish or chicken, vegetables and potatoes. Likes to put honey on a lot of his food. Snacks on Lindy's italian ice - has 4-5 a day. Replaced regular Gatorade with G2 Gatorade.  Exercise: Stays active in his yard. Would like to start walking more with his wife  Family History: Clotting disorders in his 3 brothers and mother.  Social History: Has never smoked, does not drink alcohol or use illicit drugs.  Labs: 02/2015: TC 177, TG 223, HDL 41, LDL-d 96, LFTs wnl (atorvastatin 80mg  daily, fenofibrate 48mg  daily) 09/2014: TC 206, TG 492, HDL 34, LDL-d 97, LFTs wnl (atorvastatin 40mg  daily) TG normal in 2012, most recent A1c in  04/2014 was prediabetic at 6.2  Past Medical History  Diagnosis Date  . Chronic diastolic CHF (congestive heart failure) ([place])     a. His initial ejection fraction was between 30 and 35%;  b. 04/2013 Echo: EF 50-55% Gr1 DD, mild-mod MR;  c. 04/2014 Echo: EF 60-65%, Gr 1 DD, triv TR.  Marland Kitchen Thyroid disease   . Pulmonary HTN ([place])   . Hypertension   . Addison's disease ([place])   . Hypogonadism male   . CKD (chronic kidney disease), stage III   . History of aortic insufficiency   . Hx of cardiovascular stress test     a. ETT-Myoview 7/14:  Low risk, inferior defect consistent with thinning, no ischemia, normal wall motion, EF 54%  . DVT (deep venous thrombosis) ([place])     a. 02/2013 superficial thrombophlebitis --> Xarelto later d/c'd 2/2 GIB;  b. 04/2014 Acute Right PT, Peroneal, Popliteal, Femoral, and common femoral DVT-->coumadin  . GERD (gastroesophageal reflux disease)   . H/O hiatal hernia   . Hepatitis     a. 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  . Daily headache   . Chronic back pain   . Peptic ulcer disease   . On home oxygen therapy     a. added 04/2014 in setting of PE.  Marland Kitchen Acute lower GI bleeding     a. admitted to [facility] 04/11/2013;  b. 04/2014 EGD: 1.  gastric erosions, mild prox gastritis and bulbar duodenitis->Protonix.  . Pulmonary embolism ([place])     a. 04/2014 CTA: Bilat submassive PE ->coumadin.  . Coronary artery disease     a. 07/2014 - s/p difficult PCI - had pressure wire analysis of LAD s/p DES in mid segment c/b wire-induced dissection in the distal segment of the LAD which was treated with repeated balloon inflations. Also had DES to rPDA - again had either spasm distal to the stent placement or an edge dissection but the vessel was small in that area; procedure aborted.  . Obesity   . Anemia     Current Outpatient Prescriptions on File Prior to Visit  Medication Sig Dispense Refill  . atorvastatin (LIPITOR) 80 MG tablet Take 1 tablet (80 mg total) by mouth daily. 90 tablet 3  . carvedilol  (COREG) 6.25 MG tablet Take 1 tablet (6.25 mg total) by mouth 2 (two) times daily with a meal. 180 tablet 3  . Cholecalciferol (VITAMIN D3 PO) Take 1,000 capsules by mouth 2 (two) times a week.     . clopidogrel (PLAVIX) 75 MG tablet Take 1 tablet (75 mg total) by mouth daily. 30 tablet 11  . fenofibrate (TRICOR) 48 MG tablet Take 1 tablet (48 mg total) by mouth daily. 30 tablet 11  . furosemide (LASIX) 40 MG tablet Take 0.5 tablets (20 mg total) by mouth every other day.    . gabapentin (NEURONTIN) 600 MG tablet Take 600 mg by mouth daily.    . hydrocortisone (CORTEF) 20 MG tablet Take 20 mg by mouth 2 (two) times daily.     . isosorbide mononitrate (IMDUR) 30 MG 24 hr tablet Take 0.5 tablets (15 mg total) by mouth daily.    Marland Kitchen lisinopril (PRINIVIL,ZESTRIL) 10 MG tablet Take 5 mg by mouth daily.    . metoCLOPramide (REGLAN) 5 MG tablet Take 5 mg by mouth daily.     . nitroGLYCERIN (NITROSTAT) 0.4 MG SL tablet Place 1 tablet (0.4 mg total) under the tongue every 5 (five) minutes as needed for chest pain (up to 3 doses). 25 tablet 3  . pantoprazole (PROTONIX) 40 MG tablet Take 1 tablet (40 mg total) by mouth daily. 30 tablet 0  . potassium chloride (K-DUR,KLOR-CON) 10 MEQ tablet Take 1 tablet (10 mEq total) by mouth every other day.    Marland Kitchen PROAIR HFA 108 (90 BASE) MCG/ACT inhaler INHALE 2 PUFFS INTO THE LUNGS EVERY 6 (SIX) HOURS AS NEEDED FOR WHEEZING OR SHORTNESS OF BREATH. 8.5 Inhaler 2  . thyroid (ARMOUR) 60 MG tablet Take 60 mg by mouth daily.     . traMADol (ULTRAM) 50 MG tablet Take 100 mg by mouth 3 (three) times daily.     . traZODone (DESYREL) 100 MG tablet Take 100 mg by mouth every evening.   2  . warfarin (COUMADIN) 3 MG tablet TAKE AS DIRECTED BY COUMADIN CLINIC 30 tablet 3   No current facility-administered medications on file prior to visit.    No Known Allergies  Assessment/Plan:  1. Hyperlipidemia - Patient with LDL of 96 mg/dL above goal 70mg /dL given history of CAD s/p PCI.  Patient currently taking atorvastatin 80 mg daily and fenofibrate 48 mg. Will start Zetia 10mg  daily. TG still elevated at 223 mg/dL above goal 150mg /dL. Discussed dietary changes regarding excessive sugar consumption - patient will switch to sugar free icees. F/u lipid panel in 3 months.   Yashika Mask E. Madisynn Plair, PharmD, [facility] [id number] [address] [place], [place] [address] Phone: [phone]; Fax: [phone] 02/28/2015 10:26 AM

## 2015-03-04 ENCOUNTER — Ambulatory Visit (INDEPENDENT_AMBULATORY_CARE_PROVIDER_SITE_OTHER): Payer: BLUE CROSS/BLUE SHIELD | Admitting: *Deleted

## 2015-03-04 DIAGNOSIS — I2782 Chronic pulmonary embolism: Secondary | ICD-10-CM | POA: Diagnosis not present

## 2015-03-04 DIAGNOSIS — Z5181 Encounter for therapeutic drug level monitoring: Secondary | ICD-10-CM | POA: Diagnosis not present

## 2015-03-04 DIAGNOSIS — I82401 Acute embolism and thrombosis of unspecified deep veins of right lower extremity: Secondary | ICD-10-CM | POA: Diagnosis not present

## 2015-03-04 DIAGNOSIS — I2699 Other pulmonary embolism without acute cor pulmonale: Secondary | ICD-10-CM

## 2015-03-04 LAB — POCT INR: INR: 1.3

## 2015-03-07 ENCOUNTER — Ambulatory Visit: Payer: BLUE CROSS/BLUE SHIELD | Admitting: Emergency Medicine

## 2015-03-31 ENCOUNTER — Ambulatory Visit: Payer: BLUE CROSS/BLUE SHIELD | Admitting: Emergency Medicine

## 2015-03-31 ENCOUNTER — Encounter: Payer: Self-pay | Admitting: Emergency Medicine

## 2015-03-31 ENCOUNTER — Ambulatory Visit (INDEPENDENT_AMBULATORY_CARE_PROVIDER_SITE_OTHER): Payer: BLUE CROSS/BLUE SHIELD | Admitting: Emergency Medicine

## 2015-03-31 VITALS — BP 114/70 | HR 84 | Ht 70.0 in | Wt 222.0 lb

## 2015-03-31 DIAGNOSIS — R490 Dysphonia: Secondary | ICD-10-CM

## 2015-03-31 DIAGNOSIS — R0602 Shortness of breath: Secondary | ICD-10-CM

## 2015-03-31 DIAGNOSIS — I272 Other secondary pulmonary hypertension: Secondary | ICD-10-CM | POA: Diagnosis not present

## 2015-03-31 DIAGNOSIS — R0609 Other forms of dyspnea: Secondary | ICD-10-CM | POA: Diagnosis not present

## 2015-03-31 NOTE — Assessment & Plan Note (Signed)
Chronic hoarseness, etiology unclear. I believe he needs to have an airway inspection and I'll refer him to ENT for laryngoscopy

## 2015-03-31 NOTE — Progress Notes (Signed)
Subjective:    Patient ID: [name], male    DOB: 08/10/1940, 75 y.o.   MRN: [id number]  75 yo man, never smoker, hx HTN, diastolic dysfxn, Addison's dz, B LE DVT, GERD / PUD / GIB with anemia (Hgb 9.4-10.0). Xarelto was stopped due to the GIB (finished 5-6 weeks). 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 Better on last TTE 4/'15). Referred today for exertional SOB. No hx asthma or childhood PNA's.   He is dyspneic with exertion, progressive over several years. He has a very hoarse voice, occasionally loses his voice. He feels a weakness in his legs when he carries groceries. Sometimes hears wheezing, doesn't cough. Denies CP. He is feels GERD sx, on protonix. He is on an ACE-i.   ROV 06/27/13 -- follows for dyspnea. He has had V/q > low prob for chronic PE, Full PFT done 06/22/13 >> mild AFL, normal volumes, decrease DLCO that corrects for Va.  His breathing is about the same. No cough but still has a hoarse voice. He does have some increased GERD sx.   [facility] follow up 04/27/14 ---  Patient returns for a post hospital follow-up Patient was admitted April 15 through April 18 for acute pulmonary embolus and DVT. Patient presented with shortness of breath. CT chest showed acute bilateral pulmonary emboli. Echo showed no significant right heart strain. Venous Doppler showed a new right lower extremity DVT. Patient has a history of previous DVT 1 year ago. He did have a GI bleed while on Xarelto. This was felt secondary to associated nostril use with upper GI ulceration. Patient was seen by GI and underwent an upper endoscopy that showed gastric erosions mild proximal gastritis . He was recommended to use Protonix daily. Patient was treated with heparin with transition to Lovenox and Coumadin bridge. He was set up for oxygen with activity. However, has not been using this since discharge. Patient says that he is improved with decreased shortness of breath. However, continues to get  winded intermittently, especially with activity. He denies any signs of bleeding. He is currently on Lovenox and Coumadin. He denies any chest pain, palpitations, orthopnea, PND, extremity weakness, slurred speech, visual or speech changes, amoxicillin, hematuria, bloody stools. He does complain he has been having intermittent headaches, but denies any neurological changes. Patient has an appointment with his primary care physician today. We discussed that he will need lab work including his INR and Coumadin follow-ups at the Coumadin clinic at [facility]. Says he has a family history of blood clots, discussed referral to hematology.  ROV 06/06/14 -- follow-up visit for DVT and pulmonary embolism diagnosed in April 2016. He also has mild obstruction on pulmonary function testing, a history of diastolic and systolic CHF.  He has been supertherapeutic on coumadin, being adjusted. No current bleeding. Continues to have hoarse voice, no cough. He has been inactive lately. He is having dizziness and imbalance lately.   ROV 12/27/14 -- Follow-up visit for history of mild obstructive lung disease, nonischemic cardiomyopathy with systolic CHF, DVT and PE diagnosed April 2016. This was a second DVT as he was also treated for a clot in 2015. He has some chronic upper airway irritation and hoarse voice. Presents today reporting that his coumadin has been influenced by a recent sinusitis and abx use. He was HA, sinus pain, drainage. He still has hoarse voice. He notes heavy breathing with walking, dressing. He has tried Ship broker, without much benefit. His wt has been stable. He  remains busy, active, but notes that he is more dyspneic. He is having UA noise. Having more GERD sx lately. His Addison's meds have been stable.   ROV 02/07/15 -- history of Addison's disease, mild obstructive lung disease, nonischemic cardiomyopathy with systolic CHF, DVT and PE diagnosed April 2016. This was a second DVT as he was also treated for  a clot in 2015. He has some chronic upper airway irritation and hoarse voice. Last visit we started Symbicort to see if he would benefit. He did not notice any benefit.  His largest complaint is not being able to take a deep breath. He has chronic hoarseness but does not believe he is having upper airway  obstruction.   ROV 03/31/15 -- patient with a history of mild obstructive lung disease. He also has a nonischemic cardiomyopathy with systolic CHF, history of DVT and PE for which she is anticoagulated, Addison's disease. He has chronic upper airways disease and hoarse voice.. I suspect that his dyspnea is multifactorial with contribution of deconditioning.  He had a CPEX in 6/16 that showed cardiac limitation, he then went for cath that showed normal PAPs and CAd that required PTCI. He felt better initially, but now back to baseline dyspnea or worse.    ROS:   As per the HPI  EXAM:  Filed Vitals:   03/31/15 1433  BP: 114/70  Pulse: 84  Height: 5\' 10"  (1.778 m)  Weight: 222 lb (100.699 kg)  SpO2: 93%    Gen: Pleasant, well-nourished, in no distress,  normal affect  ENT: No lesions,  mouth clear,  oropharynx clear, no postnasal drip, hoarseg  Neck: No JVD, no TMG, no carotid bruits  Lungs: No use of accessory muscles, clear without rales or rhonchi  Cardiovascular: RRR, heart sounds normal, no murmur or gallops, tr peripheral edema  Musculoskeletal: No deformities, no cyanosis or clubbing  Neuro: alert, non focal  Skin: Warm, no lesions or rashes      Assessment & Plan:  SOB (shortness of breath) He continues to have severely limiting exertional dyspnea, etiology unclear. After his last cardio pulmonary exercise test in June 2016 he underwent cardiac catheterization that showed coronary disease. He underwent stenting to his PDA and his LAD. He was initially better but then his breathing worsened again. Again it's unclear to me whether this is pulmonary or cardiac. His pulmonary  evaluation thus far has been underwhelming. He had a right heart catheterization that showed normal pulmonary arterial pressures. We will repeat the cardio pulmonary exercise test to decide our next steps  Pulmonary hypertension Normal pulmonary pressures by right heart catheterization in 07/2014  Hoarseness Chronic hoarseness, etiology unclear. I believe he needs to have an airway inspection and I'll refer him to ENT for laryngoscopy

## 2015-03-31 NOTE — Patient Instructions (Signed)
We will go forward with your cardiopulmonary exercise testing.  We will refer you to see ENT to have your hoarseness evaluated.  Follow with Dr Lamonte Sakai in 2-3 months or sooner if you have any problems.

## 2015-03-31 NOTE — Assessment & Plan Note (Signed)
Normal pulmonary pressures by right heart catheterization in 07/2014

## 2015-03-31 NOTE — Assessment & Plan Note (Signed)
He continues to have severely limiting exertional dyspnea, etiology unclear. After his last cardio pulmonary exercise test in June 2016 he underwent cardiac catheterization that showed coronary disease. He underwent stenting to his PDA and his LAD. He was initially better but then his breathing worsened again. Again it's unclear to me whether this is pulmonary or cardiac. His pulmonary evaluation thus far has been underwhelming. He had a right heart catheterization that showed normal pulmonary arterial pressures. We will repeat the cardio pulmonary exercise test to decide our next steps

## 2015-04-01 ENCOUNTER — Encounter: Payer: BLUE CROSS/BLUE SHIELD | Admitting: Oncology

## 2015-04-01 ENCOUNTER — Telehealth (HOSPITAL_COMMUNITY): Payer: Self-pay | Admitting: Vascular Surgery

## 2015-04-01 NOTE — Telephone Encounter (Signed)
Returned pt call to change CPX, No voicemail

## 2015-04-02 ENCOUNTER — Other Ambulatory Visit: Payer: Self-pay | Admitting: *Deleted

## 2015-04-02 DIAGNOSIS — I5032 Chronic diastolic (congestive) heart failure: Secondary | ICD-10-CM

## 2015-04-02 MED ORDER — POTASSIUM CHLORIDE CRYS ER 10 MEQ PO TBCR: 10.0000 meq | EXTENDED_RELEASE_TABLET | ORAL | Status: DC

## 2015-04-03 ENCOUNTER — Ambulatory Visit (HOSPITAL_COMMUNITY): Payer: BLUE CROSS/BLUE SHIELD | Attending: Emergency Medicine

## 2015-04-03 DIAGNOSIS — R0609 Other forms of dyspnea: Secondary | ICD-10-CM

## 2015-04-03 DIAGNOSIS — R0600 Dyspnea, unspecified: Secondary | ICD-10-CM | POA: Insufficient documentation

## 2015-04-04 DIAGNOSIS — R0600 Dyspnea, unspecified: Secondary | ICD-10-CM | POA: Diagnosis not present

## 2015-04-07 ENCOUNTER — Telehealth: Payer: Self-pay | Admitting: Emergency Medicine

## 2015-04-07 NOTE — Telephone Encounter (Signed)
Called spoke with Carlyon Shadow (spouse). Pt had CPST on 04/04/15. Please advise RB thanks

## 2015-04-07 NOTE — Telephone Encounter (Signed)
Please let the patient know that the exercise test shows that he does not have a ventilatory limitation on exercise due to lung dysfunction. It instead suggests that he has a cardiac limitation on exercise. Unclear to me whether this reflects heart disease or possibly medications. I believe he needs to see cardiology asap to review the CPEX and discuss any further workup that needs to be done.

## 2015-04-08 NOTE — Telephone Encounter (Signed)
Spoke with pt's wife and advised of pt's CPST results per Dr Lamonte Sakai.  She states that she will call and make appt with Dr Acie Fredrickson asap.

## 2015-04-14 ENCOUNTER — Encounter (HOSPITAL_COMMUNITY): Payer: BLUE CROSS/BLUE SHIELD

## 2015-04-15 ENCOUNTER — Encounter: Payer: Self-pay | Admitting: Cardiology

## 2015-04-15 ENCOUNTER — Ambulatory Visit (INDEPENDENT_AMBULATORY_CARE_PROVIDER_SITE_OTHER): Payer: BLUE CROSS/BLUE SHIELD | Admitting: *Deleted

## 2015-04-15 ENCOUNTER — Ambulatory Visit (INDEPENDENT_AMBULATORY_CARE_PROVIDER_SITE_OTHER): Payer: BLUE CROSS/BLUE SHIELD | Admitting: Cardiology

## 2015-04-15 VITALS — BP 118/76 | HR 93 | Ht 70.0 in | Wt 228.0 lb

## 2015-04-15 DIAGNOSIS — I2510 Atherosclerotic heart disease of native coronary artery without angina pectoris: Secondary | ICD-10-CM

## 2015-04-15 DIAGNOSIS — R9430 Abnormal result of cardiovascular function study, unspecified: Secondary | ICD-10-CM | POA: Insufficient documentation

## 2015-04-15 DIAGNOSIS — N183 Chronic kidney disease, stage 3 unspecified: Secondary | ICD-10-CM

## 2015-04-15 DIAGNOSIS — R0609 Other forms of dyspnea: Secondary | ICD-10-CM

## 2015-04-15 DIAGNOSIS — E039 Hypothyroidism, unspecified: Secondary | ICD-10-CM | POA: Diagnosis not present

## 2015-04-15 DIAGNOSIS — Z5181 Encounter for therapeutic drug level monitoring: Secondary | ICD-10-CM

## 2015-04-15 DIAGNOSIS — I2699 Other pulmonary embolism without acute cor pulmonale: Secondary | ICD-10-CM

## 2015-04-15 DIAGNOSIS — E271 Primary adrenocortical insufficiency: Secondary | ICD-10-CM

## 2015-04-15 DIAGNOSIS — R0602 Shortness of breath: Secondary | ICD-10-CM | POA: Diagnosis not present

## 2015-04-15 DIAGNOSIS — I82401 Acute embolism and thrombosis of unspecified deep veins of right lower extremity: Secondary | ICD-10-CM

## 2015-04-15 DIAGNOSIS — Z9861 Coronary angioplasty status: Secondary | ICD-10-CM

## 2015-04-15 DIAGNOSIS — I2782 Chronic pulmonary embolism: Secondary | ICD-10-CM

## 2015-04-15 DIAGNOSIS — R0600 Dyspnea, unspecified: Secondary | ICD-10-CM

## 2015-04-15 LAB — CBC
HCT: 24.2 % — ABNORMAL LOW (ref 39.0–52.0)
Hemoglobin: 7.3 g/dL — ABNORMAL LOW (ref 13.0–17.0)
MCH: 21 pg — ABNORMAL LOW (ref 26.0–34.0)
MCHC: 30.2 g/dL (ref 30.0–36.0)
MCV: 69.5 fL — ABNORMAL LOW (ref 78.0–100.0)
MPV: 9.1 fL (ref 8.6–12.4)
Platelets: 452 10*3/uL — ABNORMAL HIGH (ref 150–400)
RBC: 3.48 MIL/uL — ABNORMAL LOW (ref 4.22–5.81)
RDW: 18 % — ABNORMAL HIGH (ref 11.5–15.5)
WBC: 13 10*3/uL — ABNORMAL HIGH (ref 4.0–10.5)

## 2015-04-15 LAB — TSH: TSH: 1.24 mIU/L (ref 0.40–4.50)

## 2015-04-15 LAB — T4, FREE: Free T4: 0.7 ng/dL — ABNORMAL LOW (ref 0.8–1.8)

## 2015-04-15 LAB — POCT INR: INR: 3.4

## 2015-04-15 MED ORDER — FUROSEMIDE 20 MG PO TABS: 20.0000 mg | ORAL_TABLET | Freq: Every day | ORAL | Status: DC

## 2015-04-15 NOTE — Assessment & Plan Note (Signed)
[name] main complaint is worsening exertional dyspnea

## 2015-04-15 NOTE — Progress Notes (Addendum)
04/15/2015 [name]   [birth year]  [id number]  Primary Physician  Melinda Crutch, MD Primary Cardiologist: Dr Acie Fredrickson  HPI:   75 y/o complicated patient added to my schedule today after he had an abnormal cardio-pulmonary function study that suggested a cardiac etiology to the pt's complaints of acute on chronic dyspnea. The pt has CAD- s/p complex PCI in July 2016. Myoview in Nov 2016 was not normal but medical Rx recommended. His EF was 55% in April, and 47% in Nov at his Myoview. He has had dyspnea with any exertion. His wife says its chronic but getting worse. They had seen Dr Lamonte Sakai who had ordered the cardio-pulmonary stress. They were told the problem was his heart and to get to his heart doctor ASAP.           The pt denies orthopnea or edema. He is on a low dose of Lasix- 20 mg QOD. He has stage 3 CRI and is followed by a nephrologist in [place].   Current Outpatient Prescriptions  Medication Sig Dispense Refill  . atorvastatin (LIPITOR) 80 MG tablet Take 1 tablet (80 mg total) by mouth daily. 90 tablet 3  . carvedilol (COREG) 6.25 MG tablet Take 1 tablet (6.25 mg total) by mouth 2 (two) times daily with a meal. 180 tablet 3  . Cholecalciferol (VITAMIN D3 PO) Take 1,000 capsules by mouth 2 (two) times a week.     . clopidogrel (PLAVIX) 75 MG tablet Take 1 tablet (75 mg total) by mouth daily. 30 tablet 11  . ezetimibe (ZETIA) 10 MG tablet Take 1 tablet (10 mg total) by mouth daily. 30 tablet 11  . fenofibrate (TRICOR) 48 MG tablet Take 1 tablet (48 mg total) by mouth daily. 30 tablet 11  . gabapentin (NEURONTIN) 600 MG tablet Take 600 mg by mouth daily.    . hydrocortisone (CORTEF) 20 MG tablet Take 20 mg by mouth 2 (two) times daily.     . isosorbide mononitrate (IMDUR) 30 MG 24 hr tablet Take 0.5 tablets (15 mg total) by mouth daily.    Marland Kitchen lisinopril (PRINIVIL,ZESTRIL) 10 MG tablet Take 5 mg by mouth daily.    . metoCLOPramide (REGLAN) 5 MG tablet Take 5 mg by mouth daily.     .  nitroGLYCERIN (NITROSTAT) 0.4 MG SL tablet Place 1 tablet (0.4 mg total) under the tongue every 5 (five) minutes as needed for chest pain (up to 3 doses). 25 tablet 3  . pantoprazole (PROTONIX) 40 MG tablet Take 1 tablet (40 mg total) by mouth daily. 30 tablet 0  . potassium chloride (K-DUR,KLOR-CON) 10 MEQ tablet Take 5 mEq by mouth daily.    Marland Kitchen PROAIR HFA 108 (90 BASE) MCG/ACT inhaler INHALE 2 PUFFS INTO THE LUNGS EVERY 6 (SIX) HOURS AS NEEDED FOR WHEEZING OR SHORTNESS OF BREATH. 8.5 Inhaler 2  . thyroid (ARMOUR) 60 MG tablet Take 60 mg by mouth daily.     . traMADol (ULTRAM) 50 MG tablet Take 100 mg by mouth 3 (three) times daily.     . traZODone (DESYREL) 100 MG tablet Take 100 mg by mouth every evening.   2  . warfarin (COUMADIN) 3 MG tablet TAKE AS DIRECTED BY COUMADIN CLINIC 30 tablet 3  . furosemide (LASIX) 20 MG tablet Take 1 tablet (20 mg total) by mouth daily. 90 tablet 3   No current facility-administered medications for this visit.    No Known Allergies  Social History   Social History  . Marital  Status: Married    Spouse Name: N/A  . Number of Children: N/A  . Years of Education: N/A   Occupational History  . retired    Social History Main Topics  . Smoking status: Never Smoker   . Smokeless tobacco: Never Used  . Alcohol Use: No  . Drug Use: No  . Sexual Activity: Not Currently   Other Topics Concern  . Not on file   Social History Narrative     Review of Systems: General: negative for chills, fever, night sweats or weight changes.  Cardiovascular: negative for chest pain, edema, orthopnea, palpitations Dermatological: negative for rash Respiratory: negative for cough or wheezing Urologic: negative for hematuria Abdominal: negative for nausea, vomiting, diarrhea, bright red blood per rectum, melena, or hematemesis Neurologic: negative for visual changes, syncope, or dizziness All other systems reviewed and are otherwise negative except as noted  above.    Blood pressure 118/76, pulse 93, height 5\' 10"  (1.778 m), weight 228 lb (103.42 kg).  General appearance: alert, cooperative, no distress and moderately obese Neck: no carotid bruit and no JVD Lungs: decreased but no rales Heart: regular rate and rhythm Abdomen: obese, non tender Extremities: no edema Skin: Skin color, texture, turgor normal. No rashes or lesions Neurologic: Grossly normal  EKG NSR,   ASSESSMENT AND PLAN:   Dyspnea on exertion [name] main complaint is worsening exertional dyspnea  Abnormal cardiovascular function study Pt added on to my schedule after abnormal cardio-pulmonary stress test on 04/04/15 indicating a cardiac etiology for his dyspnea  CKD (chronic kidney disease) stage 3, GFR 30-59 ml/min Followed by [facility]  Addison disease On chronic hydrocortisone  History of pulmonary embolism 04/2014 CTA: Bilat PE w/ right heart strain-->initially placed on Xarelto but had GI bleeding and this was changed to coumadin. His INR has been sub therapeutic recently.   Hypothyroidism Last TSH was low   PLAN  I reviewed his stress test with Dr Meda Coffee. Plan is for an echo ASAP. I also ordered labs including a TSH, BNP. CBC, and BMP. He will have an INR today in the office. I increased his Lasix to 40 mg daily x 3 days followed by 20 mg daily. He will follow up with Dr Acie Fredrickson. I did offer to admit him today as he is obviously SOB with exertion (O2 sat 98 on RA) but he declined.   Kerin Ransom K PA-C 04/15/2015 2:34 PM

## 2015-04-15 NOTE — Assessment & Plan Note (Signed)
04/2014 CTA: Bilat PE w/ right heart strain-->coumadin though his INR has been sub theraputic

## 2015-04-15 NOTE — Assessment & Plan Note (Signed)
Followed by [facility]

## 2015-04-15 NOTE — Assessment & Plan Note (Signed)
On chronic hydrocortisone

## 2015-04-15 NOTE — Assessment & Plan Note (Signed)
Last TSH was low

## 2015-04-15 NOTE — Patient Instructions (Signed)
Medication Instructions:  1) INCREASE your Furosemide to 40mg  once daily x 3 days, then start taking 20mg  once daily.  Labwork: BNP, BMP, CBC, TSH and Free T4 today.  Testing/Procedures: Your physician has requested that you have an echocardiogram this week. Echocardiography is a painless test that uses sound waves to create images of your heart. It provides your doctor with information about the size and shape of your heart and how well your heart's chambers and valves are working. This procedure takes approximately one hour. There are no restrictions for this procedure.   Follow-Up: Keep follow up appointment as previously scheduled with [name].  Your physician recommends that you schedule a follow-up appointment in: the CHF clinic soon after you are seen by [name] on 04/30/15.   Any Other Special Instructions Will Be Listed Below (If Applicable).     If you need a refill on your cardiac medications before your next appointment, please call your pharmacy.

## 2015-04-15 NOTE — Assessment & Plan Note (Signed)
Pt added on to my schedule after abnormal cardio-pulmonary stress test on 04/04/15 indicating a cardiac etiology for his dyspnea

## 2015-04-16 ENCOUNTER — Encounter (HOSPITAL_COMMUNITY): Payer: Self-pay | Admitting: Family Medicine

## 2015-04-16 ENCOUNTER — Observation Stay (HOSPITAL_COMMUNITY)
Admission: EM | Admit: 2015-04-16 | Discharge: 2015-04-18 | Disposition: A | Discharge status: Home / Self Care | Payer: BLUE CROSS/BLUE SHIELD | Attending: Internal Medicine | Admitting: Internal Medicine

## 2015-04-16 ENCOUNTER — Ambulatory Visit: Payer: BLUE CROSS/BLUE SHIELD | Admitting: Nurse Practitioner

## 2015-04-16 ENCOUNTER — Telehealth: Payer: Self-pay | Admitting: Cardiology

## 2015-04-16 ENCOUNTER — Observation Stay (HOSPITAL_COMMUNITY): Payer: BLUE CROSS/BLUE SHIELD

## 2015-04-16 DIAGNOSIS — J9610 Chronic respiratory failure, unspecified whether with hypoxia or hypercapnia: Secondary | ICD-10-CM | POA: Diagnosis not present

## 2015-04-16 DIAGNOSIS — Z7902 Long term (current) use of antithrombotics/antiplatelets: Secondary | ICD-10-CM | POA: Insufficient documentation

## 2015-04-16 DIAGNOSIS — K279 Peptic ulcer, site unspecified, unspecified as acute or chronic, without hemorrhage or perforation: Secondary | ICD-10-CM

## 2015-04-16 DIAGNOSIS — D649 Anemia, unspecified: Secondary | ICD-10-CM | POA: Insufficient documentation

## 2015-04-16 DIAGNOSIS — E271 Primary adrenocortical insufficiency: Secondary | ICD-10-CM | POA: Diagnosis not present

## 2015-04-16 DIAGNOSIS — R0609 Other forms of dyspnea: Secondary | ICD-10-CM

## 2015-04-16 DIAGNOSIS — I82509 Chronic embolism and thrombosis of unspecified deep veins of unspecified lower extremity: Secondary | ICD-10-CM | POA: Insufficient documentation

## 2015-04-16 DIAGNOSIS — I2782 Chronic pulmonary embolism: Secondary | ICD-10-CM | POA: Diagnosis not present

## 2015-04-16 DIAGNOSIS — Z7982 Long term (current) use of aspirin: Secondary | ICD-10-CM | POA: Diagnosis not present

## 2015-04-16 DIAGNOSIS — I2699 Other pulmonary embolism without acute cor pulmonale: Secondary | ICD-10-CM | POA: Diagnosis present

## 2015-04-16 DIAGNOSIS — E039 Hypothyroidism, unspecified: Secondary | ICD-10-CM | POA: Insufficient documentation

## 2015-04-16 DIAGNOSIS — N183 Chronic kidney disease, stage 3 unspecified: Secondary | ICD-10-CM | POA: Diagnosis present

## 2015-04-16 DIAGNOSIS — I2510 Atherosclerotic heart disease of native coronary artery without angina pectoris: Secondary | ICD-10-CM | POA: Diagnosis not present

## 2015-04-16 DIAGNOSIS — Z7901 Long term (current) use of anticoagulants: Secondary | ICD-10-CM | POA: Insufficient documentation

## 2015-04-16 DIAGNOSIS — R5383 Other fatigue: Secondary | ICD-10-CM | POA: Diagnosis present

## 2015-04-16 DIAGNOSIS — I5032 Chronic diastolic (congestive) heart failure: Secondary | ICD-10-CM | POA: Insufficient documentation

## 2015-04-16 DIAGNOSIS — I130 Hypertensive heart and chronic kidney disease with heart failure and stage 1 through stage 4 chronic kidney disease, or unspecified chronic kidney disease: Secondary | ICD-10-CM | POA: Diagnosis not present

## 2015-04-16 DIAGNOSIS — D631 Anemia in chronic kidney disease: Secondary | ICD-10-CM | POA: Diagnosis not present

## 2015-04-16 DIAGNOSIS — Z9861 Coronary angioplasty status: Secondary | ICD-10-CM | POA: Diagnosis not present

## 2015-04-16 DIAGNOSIS — R0600 Dyspnea, unspecified: Principal | ICD-10-CM | POA: Insufficient documentation

## 2015-04-16 LAB — COMPREHENSIVE METABOLIC PANEL
ALT: 16 U/L — ABNORMAL LOW (ref 17–63)
ANION GAP: 14 (ref 5–15)
AST: 17 U/L (ref 15–41)
Albumin: 3.4 g/dL — ABNORMAL LOW (ref 3.5–5.0)
Alkaline Phosphatase: 41 U/L (ref 38–126)
BILIRUBIN TOTAL: 0.2 mg/dL — AB (ref 0.3–1.2)
BUN: 32 mg/dL — AB (ref 6–20)
CO2: 20 mmol/L — ABNORMAL LOW (ref 22–32)
Calcium: 8.8 mg/dL — ABNORMAL LOW (ref 8.9–10.3)
Chloride: 110 mmol/L (ref 101–111)
Creatinine, Ser: 2.08 mg/dL — ABNORMAL HIGH (ref 0.61–1.24)
GFR, EST AFRICAN AMERICAN: 34 mL/min — AB (ref >60)
GFR, EST NON AFRICAN AMERICAN: 30 mL/min — AB (ref >60)
Glucose, Bld: 89 mg/dL (ref 65–99)
POTASSIUM: 4 mmol/L (ref 3.5–5.1)
SODIUM: 144 mmol/L (ref 135–145)
TOTAL PROTEIN: 6.2 g/dL — AB (ref 6.5–8.1)

## 2015-04-16 LAB — BRAIN NATRIURETIC PEPTIDE: Brain Natriuretic Peptide: 23.1 pg/mL (ref <100)

## 2015-04-16 LAB — IRON AND TIBC
Iron: 13 ug/dL — ABNORMAL LOW (ref 45–182)
SATURATION RATIOS: 3 % — AB (ref 17.9–39.5)
TIBC: 440 ug/dL (ref 250–450)
UIBC: 427 ug/dL

## 2015-04-16 LAB — HEMOGLOBIN AND HEMATOCRIT, BLOOD
HCT: 27.9 % — ABNORMAL LOW (ref 39.0–52.0)
Hemoglobin: 8 g/dL — ABNORMAL LOW (ref 13.0–17.0)

## 2015-04-16 LAB — CBC
HEMATOCRIT: 24.6 % — AB (ref 39.0–52.0)
HEMOGLOBIN: 7.3 g/dL — AB (ref 13.0–17.0)
MCH: 21.7 pg — ABNORMAL LOW (ref 26.0–34.0)
MCHC: 29.7 g/dL — ABNORMAL LOW (ref 30.0–36.0)
MCV: 73.2 fL — ABNORMAL LOW (ref 78.0–100.0)
Platelets: 386 10*3/uL (ref 150–400)
RBC: 3.36 MIL/uL — ABNORMAL LOW (ref 4.22–5.81)
RDW: 18.3 % — AB (ref 11.5–15.5)
WBC: 13.1 10*3/uL — AB (ref 4.0–10.5)

## 2015-04-16 LAB — BASIC METABOLIC PANEL
BUN: 31 mg/dL — ABNORMAL HIGH (ref 7–25)
CO2: 20 mmol/L (ref 20–31)
Calcium: 8.8 mg/dL (ref 8.6–10.3)
Chloride: 110 mmol/L (ref 98–110)
Creat: 2.19 mg/dL — ABNORMAL HIGH (ref 0.70–1.18)
Glucose, Bld: 111 mg/dL — ABNORMAL HIGH (ref 65–99)
Potassium: 4.6 mmol/L (ref 3.5–5.3)
Sodium: 143 mmol/L (ref 135–146)

## 2015-04-16 LAB — FERRITIN: Ferritin: 8 ng/mL — ABNORMAL LOW (ref 24–336)

## 2015-04-16 LAB — PREPARE RBC (CROSSMATCH)

## 2015-04-16 LAB — POC OCCULT BLOOD, ED: FECAL OCCULT BLD: NEGATIVE

## 2015-04-16 LAB — RETICULOCYTES
RBC.: 3.59 MIL/uL — ABNORMAL LOW (ref 4.22–5.81)
RETIC COUNT ABSOLUTE: 75.4 10*3/uL (ref 19.0–186.0)
RETIC CT PCT: 2.1 % (ref 0.4–3.1)

## 2015-04-16 LAB — FOLATE: Folate: 5.5 ng/mL — ABNORMAL LOW (ref >5.9)

## 2015-04-16 LAB — PROTIME-INR
INR: 2.22 — ABNORMAL HIGH (ref 0.00–1.49)
Prothrombin Time: 24.4 seconds — ABNORMAL HIGH (ref 11.6–15.2)

## 2015-04-16 LAB — VITAMIN B12: VITAMIN B 12: 1000 pg/mL — AB (ref 180–914)

## 2015-04-16 MED ORDER — FENOFIBRATE 54 MG PO TABS
54.0000 mg | ORAL_TABLET | Freq: Every day | ORAL | Status: DC
  Administered 2015-04-17 – 2015-04-18 (×2): 54 mg via ORAL
  Filled 2015-04-16 (×4): qty 1

## 2015-04-16 MED ORDER — TRAMADOL HCL 50 MG PO TABS
100.0000 mg | ORAL_TABLET | Freq: Three times a day (TID) | ORAL | Status: DC
  Administered 2015-04-16 – 2015-04-18 (×5): 100 mg via ORAL
  Filled 2015-04-16 (×5): qty 2

## 2015-04-16 MED ORDER — EZETIMIBE 10 MG PO TABS
10.0000 mg | ORAL_TABLET | Freq: Every day | ORAL | Status: DC
  Administered 2015-04-17 – 2015-04-18 (×2): 10 mg via ORAL
  Filled 2015-04-16 (×3): qty 1

## 2015-04-16 MED ORDER — ATORVASTATIN CALCIUM 80 MG PO TABS
80.0000 mg | ORAL_TABLET | Freq: Every day | ORAL | Status: DC
  Administered 2015-04-16 – 2015-04-17 (×2): 80 mg via ORAL
  Filled 2015-04-16 (×2): qty 1

## 2015-04-16 MED ORDER — ONDANSETRON HCL 4 MG/2ML IJ SOLN: 4.0000 mg | Freq: Four times a day (QID) | INTRAMUSCULAR | Status: DC | PRN

## 2015-04-16 MED ORDER — ONDANSETRON HCL 4 MG PO TABS: 4.0000 mg | ORAL_TABLET | Freq: Four times a day (QID) | ORAL | Status: DC | PRN

## 2015-04-16 MED ORDER — SODIUM CHLORIDE 0.9 % IV SOLN: Freq: Once | INTRAVENOUS | Status: DC

## 2015-04-16 MED ORDER — ISOSORBIDE MONONITRATE ER 30 MG PO TB24
15.0000 mg | ORAL_TABLET | Freq: Every day | ORAL | Status: DC
  Administered 2015-04-17 – 2015-04-18 (×2): 15 mg via ORAL
  Filled 2015-04-16 (×3): qty 1

## 2015-04-16 MED ORDER — SODIUM CHLORIDE 0.9 % IV SOLN: 250.0000 mL | INTRAVENOUS | Status: DC | PRN

## 2015-04-16 MED ORDER — ATORVASTATIN CALCIUM 80 MG PO TABS: 80.0000 mg | ORAL_TABLET | Freq: Every day | ORAL | Status: DC

## 2015-04-16 MED ORDER — POTASSIUM CHLORIDE CRYS ER 10 MEQ PO TBCR
5.0000 meq | EXTENDED_RELEASE_TABLET | Freq: Every day | ORAL | Status: DC
  Filled 2015-04-16: qty 1

## 2015-04-16 MED ORDER — CARVEDILOL 6.25 MG PO TABS
6.2500 mg | ORAL_TABLET | Freq: Two times a day (BID) | ORAL | Status: DC
  Administered 2015-04-16 – 2015-04-18 (×4): 6.25 mg via ORAL
  Filled 2015-04-16 (×4): qty 1

## 2015-04-16 MED ORDER — ACETAMINOPHEN 650 MG RE SUPP: 650.0000 mg | Freq: Four times a day (QID) | RECTAL | Status: DC | PRN

## 2015-04-16 MED ORDER — LISINOPRIL 5 MG PO TABS: 5.0000 mg | ORAL_TABLET | Freq: Every day | ORAL | Status: DC

## 2015-04-16 MED ORDER — HYDROCORTISONE 20 MG PO TABS
20.0000 mg | ORAL_TABLET | Freq: Two times a day (BID) | ORAL | Status: DC
  Administered 2015-04-16 – 2015-04-18 (×4): 20 mg via ORAL
  Filled 2015-04-16 (×5): qty 1

## 2015-04-16 MED ORDER — ACETAMINOPHEN 325 MG PO TABS: 650.0000 mg | ORAL_TABLET | Freq: Four times a day (QID) | ORAL | Status: DC | PRN

## 2015-04-16 MED ORDER — BISACODYL 10 MG RE SUPP: 10.0000 mg | Freq: Every day | RECTAL | Status: DC | PRN

## 2015-04-16 MED ORDER — SODIUM CHLORIDE 0.9% FLUSH
3.0000 mL | Freq: Two times a day (BID) | INTRAVENOUS | Status: DC
  Administered 2015-04-17: 3 mL via INTRAVENOUS

## 2015-04-16 MED ORDER — METOCLOPRAMIDE HCL 5 MG PO TABS
5.0000 mg | ORAL_TABLET | Freq: Every day | ORAL | Status: DC
  Administered 2015-04-16 – 2015-04-18 (×3): 5 mg via ORAL
  Filled 2015-04-16 (×3): qty 1

## 2015-04-16 MED ORDER — TRAZODONE HCL 100 MG PO TABS
100.0000 mg | ORAL_TABLET | Freq: Every evening | ORAL | Status: DC
  Administered 2015-04-16 – 2015-04-17 (×2): 100 mg via ORAL
  Filled 2015-04-16 (×3): qty 1

## 2015-04-16 MED ORDER — SENNOSIDES-DOCUSATE SODIUM 8.6-50 MG PO TABS
2.0000 | ORAL_TABLET | Freq: Two times a day (BID) | ORAL | Status: DC
  Administered 2015-04-17 – 2015-04-18 (×3): 2 via ORAL
  Filled 2015-04-16 (×3): qty 2

## 2015-04-16 MED ORDER — ALBUTEROL SULFATE (2.5 MG/3ML) 0.083% IN NEBU: 2.5000 mg | INHALATION_SOLUTION | Freq: Four times a day (QID) | RESPIRATORY_TRACT | Status: DC | PRN

## 2015-04-16 MED ORDER — LISINOPRIL 5 MG PO TABS
5.0000 mg | ORAL_TABLET | Freq: Every day | ORAL | Status: DC
  Administered 2015-04-17 – 2015-04-18 (×2): 5 mg via ORAL
  Filled 2015-04-16 (×2): qty 1

## 2015-04-16 MED ORDER — FUROSEMIDE 20 MG PO TABS
20.0000 mg | ORAL_TABLET | Freq: Every day | ORAL | Status: DC
  Administered 2015-04-17 – 2015-04-18 (×2): 20 mg via ORAL
  Filled 2015-04-16 (×2): qty 1

## 2015-04-16 MED ORDER — THYROID 60 MG PO TABS
60.0000 mg | ORAL_TABLET | Freq: Every day | ORAL | Status: DC
  Administered 2015-04-17 – 2015-04-18 (×2): 60 mg via ORAL
  Filled 2015-04-16 (×4): qty 1

## 2015-04-16 MED ORDER — SODIUM CHLORIDE 0.9% FLUSH
3.0000 mL | Freq: Two times a day (BID) | INTRAVENOUS | Status: DC
  Administered 2015-04-17 (×2): 3 mL via INTRAVENOUS

## 2015-04-16 MED ORDER — PANTOPRAZOLE SODIUM 40 MG PO TBEC: 40.0000 mg | DELAYED_RELEASE_TABLET | Freq: Every day | ORAL | Status: DC

## 2015-04-16 MED ORDER — GABAPENTIN 300 MG PO CAPS
600.0000 mg | ORAL_CAPSULE | Freq: Every day | ORAL | Status: DC
  Administered 2015-04-16 – 2015-04-18 (×3): 600 mg via ORAL
  Filled 2015-04-16: qty 2
  Filled 2015-04-16: qty 6
  Filled 2015-04-16 (×3): qty 2

## 2015-04-16 MED ORDER — TRAMADOL HCL 50 MG PO TABS: 100.0000 mg | ORAL_TABLET | Freq: Three times a day (TID) | ORAL | Status: DC

## 2015-04-16 MED ORDER — ALBUTEROL SULFATE HFA 108 (90 BASE) MCG/ACT IN AERS: 1.0000 | INHALATION_SPRAY | Freq: Four times a day (QID) | RESPIRATORY_TRACT | Status: DC | PRN

## 2015-04-16 MED ORDER — METOCLOPRAMIDE HCL 5 MG PO TABS: 5.0000 mg | ORAL_TABLET | Freq: Every day | ORAL | Status: DC

## 2015-04-16 MED ORDER — SODIUM CHLORIDE 0.9% FLUSH: 3.0000 mL | INTRAVENOUS | Status: DC | PRN

## 2015-04-16 MED ORDER — FUROSEMIDE 20 MG PO TABS: 20.0000 mg | ORAL_TABLET | Freq: Every day | ORAL | Status: DC

## 2015-04-16 MED ORDER — PANTOPRAZOLE SODIUM 40 MG PO TBEC
40.0000 mg | DELAYED_RELEASE_TABLET | Freq: Every day | ORAL | Status: DC
  Administered 2015-04-17 – 2015-04-18 (×2): 40 mg via ORAL
  Filled 2015-04-16: qty 1

## 2015-04-16 NOTE — ED Notes (Signed)
Lunch tray ordered for pt.

## 2015-04-16 NOTE — ED Notes (Addendum)
Pt sent here by his doctor for anemia. Had some blood work done yesterday. sts past couple of weeks he has been weak ans SOB with exertion. Denies any rectal bleeding. Pt on coumadin.

## 2015-04-16 NOTE — Consult Note (Signed)
Cardiology Consult    Patient ID: [name] MRN: [id number], DOB/AGE: [id number]   Admit date: 04/16/2015 Date of Consult: 04/16/2015  Primary Physician:  Melinda Crutch, MD Reason for Consult: Dyspnea with Exertion Primary Cardiologist: [name] Requesting Provider: Dr. Marily Memos   History of Present Illness    [name] is a 75 y.o. male w/ PMH of CAD (s/p complex DES to LAD, DES to rPDA in 07/2014), chronic diastolic CHF, Stage 3 CKD, HTN, Pulmonary HTN, history of PE (occurred in 04/2014, had GI bleed with Xarelto; now on Coumadin) and Addison's Disease who presented to [name] on 04/16/2015 for worsening dyspnea on exertion and newly diagnosed anemia.  Was seen by Dr. Lamonte Sakai and underwent a cardio-pulmonary stress test on 04/03/2015 which showed a " moderate to severly reduced functional capacity due primarily to a circulatory limitation. The pVO2 had dropped nearly 40% since previous CPX text in 6/16. Consider further cardiac work-up as clinically indicated." With these results, he was referred to Cardiology and seen by Kerin Ransom, PA-C on 04/15/2015. A stat echocardiogram was ordered and labs were placed. His Lasix was increased to 40mg  daily for 3 days, followed by 20mg  daily.  His lab work resulted this morning and showed a Hgb of 7.3 (down from, 11.1 seven months ago). WBC 13.3. Platelets 452. Electrolytes WNL. Creatinine elevated to 2.19.  TSH 1.24. BNP 23.1. The patient was informed of these results and it was recommended he come [name] for further evaluation.  In talking with the patient today, he reports worsening dyspnea for the past two months. He is unsure if this is similar to his previous anginal symptoms. Denies any chest pain, palpitations, orthopnea, or PND. Does report occasional lower extremity edema. Denies any hematemesis,   While in the [facility], he has been seen by Internal Medicine and and is currently being transfused 1 unit pRBC's. While he has not  been up out of bed yet, he already reports feeling "less fatigued". Does report mild nausea.    Past Medical History   Past Medical History  Diagnosis Date  . Chronic diastolic CHF (congestive heart failure) ([place])     a. His initial ejection fraction was between 30 and 35%;  b. 04/2013 Echo: EF 50-55% Gr1 DD, mild-mod MR;  c. 04/2014 Echo: EF 60-65%, Gr 1 DD, triv TR.  Marland Kitchen Thyroid disease   . Pulmonary HTN ([place])   . Hypertension   . Addison's disease ([place])   . Hypogonadism male   . CKD (chronic kidney disease), stage III   . History of aortic insufficiency   . Hx of cardiovascular stress test     a. ETT-Myoview 7/14:  Low risk, inferior defect consistent with thinning, no ischemia, normal wall motion, EF 54%  . DVT (deep venous thrombosis) ([place])     a. 02/2013 superficial thrombophlebitis --> Xarelto later d/c'd 2/2 GIB;  b. 04/2014 Acute Right PT, Peroneal, Popliteal, Femoral, and common femoral DVT-->coumadin  . GERD (gastroesophageal reflux disease)   . H/O hiatal hernia   . Hepatitis     a. 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  . Daily headache   . Chronic back pain   . Peptic ulcer disease   . On home oxygen therapy     a. added 04/2014 in setting of PE.  Marland Kitchen Acute lower GI bleeding     a. admitted to [facility] 04/11/2013;  b. 04/2014 EGD: 1.  gastric erosions, mild prox gastritis and bulbar  duodenitis->Protonix.  . Pulmonary embolism ([place])     a. 04/2014 CTA: Bilat submassive PE ->coumadin.  . Coronary artery disease     a. 07/2014 - s/p difficult PCI - had pressure wire analysis of LAD s/p DES in mid segment c/b wire-induced dissection in the distal segment of the LAD which was treated with repeated balloon inflations. Also had DES to rPDA - again had either spasm distal to the stent placement or an edge dissection but the vessel was small in that area; procedure aborted.  . Obesity   . Anemia     Past Surgical History  Procedure Laterality Date  . US echocardiography  04/09/2010    EF 60-65%  . Tonsillectomy   1940's  . Appendectomy  1950's  . Cholecystectomy  ~ 1965  . Esophagogastroduodenoscopy N/A 04/11/2013    Procedure: ESOPHAGOGASTRODUODENOSCOPY (EGD);  Surgeon: Missy Sabins, MD;  Location: [facility] ENDOSCOPY;  Service: Endoscopy;  Laterality: N/A;  . Esophagogastroduodenoscopy N/A 05/13/2014    Procedure: ESOPHAGOGASTRODUODENOSCOPY (EGD);  Surgeon: Arta Silence, MD;  Location: [facility] ENDOSCOPY;  Service: Endoscopy;  Laterality: N/A;  . Cardiac catheterization  06/26/2008    EF 30-35%  . Cardiac catheterization N/A 08/20/2014    Procedure: Right/Left Heart Cath and Coronary Angiography;  Surgeon: Jolaine Artist, MD;  Location: [facility] LAB;  Service: Cardiovascular;  Laterality: N/A;  . Cardiac catheterization N/A 08/21/2014    Procedure: Coronary Stent Intervention;  Surgeon: Wellington Hampshire, MD;  Location: [place] [facility] LAB;  Service: Cardiovascular;  Laterality: N/A;     Allergies  No Known Allergies  Inpatient Medications    . atorvastatin  80 mg Oral Daily  . carvedilol  6.25 mg Oral BID WC  . ezetimibe  10 mg Oral Daily  . [START ON 04/17/2015] fenofibrate  54 mg Oral Daily  . furosemide  20 mg Oral Daily  . gabapentin  600 mg Oral Daily  . hydrocortisone  20 mg Oral BID  . [START ON 04/17/2015] isosorbide mononitrate  15 mg Oral Daily  . lisinopril  5 mg Oral Daily  . metoCLOPramide  5 mg Oral Daily  . pantoprazole  40 mg Oral Daily  . [START ON 04/17/2015] potassium chloride  5 mEq Oral Daily  . sodium chloride flush  3 mL Intravenous Q12H  . sodium chloride flush  3 mL Intravenous Q12H  . [START ON 04/17/2015] thyroid  60 mg Oral Daily  . traMADol  100 mg Oral TID  . traZODone  100 mg Oral QPM    Family History    Family History  Problem Relation Age of Onset  . Clotting disorder Mother   . Clotting disorder Brother   . Clotting disorder Brother   . Clotting disorder Brother   . Clotting disorder Other     Niece.    Social History    Social History   Social  History  . Marital Status: Married    Spouse Name: N/A  . Number of Children: N/A  . Years of Education: N/A   Occupational History  . retired    Social History Main Topics  . Smoking status: Never Smoker   . Smokeless tobacco: Never Used  . Alcohol Use: No  . Drug Use: No  . Sexual Activity: Not Currently   Other Topics Concern  . Not on file   Social History Narrative     Review of Systems    General:  No chills, fever, night sweats or weight changes.  Positive for fatigue.  Cardiovascular:  No chest pain, orthopnea, palpitations, paroxysmal nocturnal dyspnea. Positive for dyspnea on exertion and edema.  Dermatological: No rash, lesions/masses Respiratory: No cough, dyspnea Urologic: No hematuria, dysuria Abdominal:   No nausea, vomiting, diarrhea, bright red blood per rectum, melena, or hematemesis Neurologic:  No visual changes, wkns, changes in mental status. All other systems reviewed and are otherwise negative except as noted above.  Physical Exam    Blood pressure 154/114, pulse 95, temperature 98.5 F (36.9 C), temperature source Oral, resp. rate 13, height 5\' 10"  (1.778 m), weight 210 lb (95.255 kg), SpO2 96 %.  General: Pleasant, Caucasian male appearing in NAD.  Psych: Normal affect. Neuro: Alert and oriented X 3. Moves all extremities spontaneously. HEENT: Normal  Neck: Supple without bruits or JVD. Lungs:  Resp regular and unlabored, CTA without wheezing or rales. Heart: RRR no s3, s4, or murmurs. Abdomen: Soft, non-tender, non-distended, BS + x 4.  Extremities: No clubbing, cyanosis or edema. DP/PT/Radials 2+ and equal bilaterally.  Labs    Troponin (Point of Care Test) No results for input(s): TROPIPOC in the last 72 hours. No results for input(s): CKTOTAL, CKMB, TROPONINI in the last 72 hours. Lab Results  Component Value Date   WBC 13.1* 04/16/2015   HGB 7.3* 04/16/2015   HCT 24.6* 04/16/2015   MCV 73.2* 04/16/2015   PLT 386 04/16/2015      Recent Labs Lab 04/16/15 0922  NA 144  K 4.0  CL 110  CO2 20*  BUN 32*  CREATININE 2.08*  CALCIUM 8.8*  PROT 6.2*  BILITOT 0.2*  ALKPHOS 41  ALT 16*  AST 17  GLUCOSE 89   Lab Results  Component Value Date   CHOL 177 02/27/2015   HDL 41 02/27/2015   LDLCALC 91 02/27/2015   TRIG 223* 02/27/2015   Lab Results  Component Value Date   DDIMER 4.80* 05/10/2014     Radiology Studies    Portable Chest 1 View: 04/16/2015  CLINICAL DATA:  75 year old male with a history of shortness of breath and fatigue for 5 months. EXAM: PORTABLE CHEST 1 VIEW COMPARISON:  CT 06/21/2014, 05/11/2014, chest x-ray 05/11/2014 FINDINGS: Apical lordotic positioning. Cardiomediastinal silhouette unchanged in size and contour. Low lung volumes accentuating the interstitium. No pneumothorax.  No confluent airspace disease. Atherosclerotic calcification of the aortic arch. No displaced fracture. IMPRESSION: Low lung volumes without evidence of acute cardiopulmonary disease. Signed, Dulcy Fanny. Earleen Newport, DO [facility] Radiology Electronically Signed   By: Corrie Mckusick D.O.   On: 04/16/2015 11:43    EKG & Cardiac Imaging    EKG: Pending  Echocardiogram: 04/2014 Study Conclusions  - Left ventricle: The cavity size was normal. Systolic function was  normal. The estimated ejection fraction was in the range of 60%  to 65%. Wall motion was normal; there were no regional wall  motion abnormalities. Doppler parameters are consistent with  abnormal left ventricular relaxation (grade 1 diastolic  dysfunction). - Aortic valve: There was trivial regurgitation. - Atrial septum: No defect or patent foramen ovale was identified.  Assessment & Plan     1. Dyspnea with exertion - reports 2 months of worsening dyspnea with exertion. Recently seen by Dr. Lamonte Sakai and underwent a cardio-pulmonary stress test on 04/03/2015 which showed a " moderate to severly reduced  functional capacity due primarily to a circulatory limitation".  - found to have a Hgb of 7.3 (down from, 11.1 seven months ago). Currently being transfused  1 unit pRBC's.  - repeat echocardiogram to assess LV function is pending. - would see how his dyspnea responds to improvement of his anemia. If his EF is significantly reduced on echo, he would require further cardiac workup once his anemia and kidney function improves.  2. History of CAD - s/p complex DES to LAD, DES to rPDA in [id number] which was complicated by a wire-induced dissection - denies any current chest pain or radiating pain. Last NST was in 11/2014 and showed a small defect of mild severity in the apex.  - continue statin, BB, Zetia, Fenofibrate (statin intolerant), ACE-I, and Imdur. Plavix currently held in the setting of anemia. Would resume once safe to do so.  3. Anemia - Hgb 7.3 in the office on 04/15/2015. Previously 11.1 seven months ago. - Currently receiving 1 unit pRBC's.  - per admitting team  4. Chronic diastolic CHF  - echo in [id number] showed EF of 60-65%. Was read as 47% by NST in 11/2014. - repeat echocardiogram is pending. Does not appear to be volume overloaded on physical exam. - continue BB, ACE-I, and PO Lasix.   5. Stage 3 CKD - creatinine at 2.08 on 04/16/2015. Similar to previous values in 2016. If continues to worsen, would hold ACE-I. - continue to monitor.  6. History of PE - occurred in 04/2014, had GI bleed with Xarelto and was switched to Coumadin - Coumadin currently held in the setting of his anemia.    Signed, Erma Heritage, PA-C 04/16/2015, 1:54 PM Pager: [phone]  Patient seen, examined. Available data reviewed. Agree with findings, assessment, and plan as outlined by Bernerd Pho, PA-C. Pt independently interviewed and examined. Exam reveals an alert, oriented male in no distress. JVP is normal. There are no carotid bruits. Lung fields are clear. Heart is regular rate and  rhythm without murmur or gallop. There is no peripheral edema. His recent evaluation is reviewed. He had a myocardial perfusion scan 12/13/2014 demonstrating some artifact at the apex but no significant ischemia. LVEF was mildly reduced. He had a cardiopulmonary exercise test 04/04/2015 demonstrating circulatory impairment. An echocardiogram is currently pending. His records are carefully reviewed and he has significant coronary disease and underwent 2 vessel PCI last year. He has no symptoms of angina. The patient is admitted for progressive exertional dyspnea. He is found to have a 4 g hemoglobin drop over the last 7 months with a hemoglobin now of 7.3 mg/dL. I suspect the patient has symptomatic anemia on a background of pulmonary emboli last year. He has been anticoagulated with a combination of Xarelto and Plavix. These are on hold while he undergoes anemia evaluation. We'll review his echo when available. I don't think he will need further cardiac evaluation unless he is not symptomatically better with blood transfusion. Will follow along with you. For the long-term, probably could stop Plavix as the patient is over 6 months out from PCI.  Sherren Mocha, M.D. 04/16/2015 3:00 PM

## 2015-04-16 NOTE — ED Provider Notes (Signed)
CSN: [id number]     Arrival date & time 04/16/15  0845 History   First MD Initiated Contact with Patient 04/16/15 [phone]     Chief Complaint  Patient presents with  . Anemia    Patient is a 75 y.o. male presenting with anemia.  Anemia This is a recurrent problem. Associated symptoms include shortness of breath. Pertinent negatives include no chest pain, no abdominal pain and no headaches.  Patient's had shortness of breath and fatigue. Sent in from home after cardiology appointment yesterday. Reportedly has hemoglobin of 7. He is on Coumadin for DVTs. He had previously been on Xarelto but had GI bleeds and was switched to Coumadin. He's had shortness of breath. He was seen by pulmonology and told it was cardiac cause. Followed up with cardiology because of this. States his been feeling bad for last month.  Past Medical History  Diagnosis Date  . Chronic diastolic CHF (congestive heart failure) ([place])     a. His initial ejection fraction was between 30 and 35%;  b. 04/2013 Echo: EF 50-55% Gr1 DD, mild-mod MR;  c. 04/2014 Echo: EF 60-65%, Gr 1 DD, triv TR.  Marland Kitchen Thyroid disease   . Pulmonary HTN ([place])   . Hypertension   . Addison's disease ([place])   . Hypogonadism male   . CKD (chronic kidney disease), stage III   . History of aortic insufficiency   . Hx of cardiovascular stress test     a. ETT-Myoview 7/14:  Low risk, inferior defect consistent with thinning, no ischemia, normal wall motion, EF 54%  . DVT (deep venous thrombosis) ([place])     a. 02/2013 superficial thrombophlebitis --> Xarelto later d/c'd 2/2 GIB;  b. 04/2014 Acute Right PT, Peroneal, Popliteal, Femoral, and common femoral DVT-->coumadin  . GERD (gastroesophageal reflux disease)   . H/O hiatal hernia   . Hepatitis     a. 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  . Daily headache   . Chronic back pain   . Peptic ulcer disease   . On home oxygen therapy     a. added 04/2014 in setting of PE.  Marland Kitchen Acute lower GI bleeding     a. admitted to [facility] 04/11/2013;  b.  04/2014 EGD: 1.  gastric erosions, mild prox gastritis and bulbar duodenitis->Protonix.  . Pulmonary embolism ([place])     a. 04/2014 CTA: Bilat submassive PE ->coumadin.  . Coronary artery disease     a. 07/2014 - s/p difficult PCI - had pressure wire analysis of LAD s/p DES in mid segment c/b wire-induced dissection in the distal segment of the LAD which was treated with repeated balloon inflations. Also had DES to rPDA - again had either spasm distal to the stent placement or an edge dissection but the vessel was small in that area; procedure aborted.  . Obesity   . Anemia    Past Surgical History  Procedure Laterality Date  . US echocardiography  04/09/2010    EF 60-65%  . Tonsillectomy  1940's  . Appendectomy  1950's  . Cholecystectomy  ~ 1965  . Esophagogastroduodenoscopy N/A 04/11/2013    Procedure: ESOPHAGOGASTRODUODENOSCOPY (EGD);  Surgeon: Missy Sabins, MD;  Location: [facility] ENDOSCOPY;  Service: Endoscopy;  Laterality: N/A;  . Esophagogastroduodenoscopy N/A 05/13/2014    Procedure: ESOPHAGOGASTRODUODENOSCOPY (EGD);  Surgeon: Arta Silence, MD;  Location: [facility] ENDOSCOPY;  Service: Endoscopy;  Laterality: N/A;  . Cardiac catheterization  06/26/2008    EF 30-35%  . Cardiac catheterization N/A 08/20/2014    Procedure:  Right/Left Heart Cath and Coronary Angiography;  Surgeon: Jolaine Artist, MD;  Location: [place] CV LAB;  Service: Cardiovascular;  Laterality: N/A;  . Cardiac catheterization N/A 08/21/2014    Procedure: Coronary Stent Intervention;  Surgeon: Wellington Hampshire, MD;  Location: [place] CV LAB;  Service: Cardiovascular;  Laterality: N/A;   Family History  Problem Relation Age of Onset  . Clotting disorder Mother   . Clotting disorder Brother   . Clotting disorder Brother   . Clotting disorder Brother   . Clotting disorder Other     Niece.   Social History  Substance Use Topics  . Smoking status: Never Smoker   . Smokeless tobacco: Never Used  . Alcohol Use: No     Review of Systems  Constitutional: Positive for fatigue. Negative for activity change and appetite change.  Eyes: Negative for pain.  Respiratory: Positive for shortness of breath. Negative for chest tightness.   Cardiovascular: Negative for chest pain and leg swelling.  Gastrointestinal: Negative for nausea, vomiting, abdominal pain, diarrhea and blood in stool.  Genitourinary: Negative for flank pain.  Musculoskeletal: Negative for back pain and neck stiffness.  Skin: Positive for pallor. Negative for rash.  Neurological: Negative for weakness, numbness and headaches.  Psychiatric/Behavioral: Negative for behavioral problems.      Allergies  Review of patient's allergies indicates no known allergies.  Home Medications   Prior to Admission medications   Medication Sig Start Date End Date Taking? Authorizing Provider  atorvastatin (LIPITOR) 80 MG tablet Take 1 tablet (80 mg total) by mouth daily. 10/28/14  Yes Thayer Headings, MD  carvedilol (COREG) 6.25 MG tablet Take 1 tablet (6.25 mg total) by mouth 2 (two) times daily with a meal. 12/05/14  Yes Thayer Headings, MD  Cholecalciferol (VITAMIN D3 PO) Take 1,000 capsules by mouth 2 (two) times a week.    Yes Historical Provider, MD  clopidogrel (PLAVIX) 75 MG tablet Take 1 tablet (75 mg total) by mouth daily. 08/22/14  Yes Dayna N Dunn, PA-C  ezetimibe (ZETIA) 10 MG tablet Take 1 tablet (10 mg total) by mouth daily. 02/28/15  Yes Thayer Headings, MD  fenofibrate (TRICOR) 48 MG tablet Take 1 tablet (48 mg total) by mouth daily. 10/28/14  Yes Thayer Headings, MD  furosemide (LASIX) 20 MG tablet Take 1 tablet (20 mg total) by mouth daily. 04/15/15  Yes Luke K Kilroy, PA-C  gabapentin (NEURONTIN) 600 MG tablet Take 600 mg by mouth daily.   Yes Historical Provider, MD  hydrocortisone (CORTEF) 20 MG tablet Take 20 mg by mouth 2 (two) times daily.    Yes Historical Provider, MD  isosorbide mononitrate (IMDUR) 30 MG 24 hr tablet Take 0.5  tablets (15 mg total) by mouth daily. 09/05/14  Yes Scott Joylene Draft, PA-C  lisinopril (PRINIVIL,ZESTRIL) 10 MG tablet Take 5 mg by mouth daily.   Yes Historical Provider, MD  metoCLOPramide (REGLAN) 5 MG tablet Take 5 mg by mouth daily.    Yes Historical Provider, MD  pantoprazole (PROTONIX) 40 MG tablet Take 1 tablet (40 mg total) by mouth daily. 05/13/14  Yes Donne Hazel, MD  potassium chloride (K-DUR,KLOR-CON) 10 MEQ tablet Take 5 mEq by mouth daily.   Yes Historical Provider, MD  PROAIR HFA 108 (90 BASE) MCG/ACT inhaler INHALE 2 PUFFS INTO THE LUNGS EVERY 6 (SIX) HOURS AS NEEDED FOR WHEEZING OR SHORTNESS OF BREATH. 08/05/14  Yes Collene Gobble, MD  thyroid (ARMOUR) 60 MG tablet Take 60  mg by mouth daily.    Yes Historical Provider, MD  traMADol (ULTRAM) 50 MG tablet Take 100 mg by mouth 3 (three) times daily.    Yes Historical Provider, MD  traZODone (DESYREL) 100 MG tablet Take 100 mg by mouth every evening.  04/16/14  Yes Historical Provider, MD  warfarin (COUMADIN) 3 MG tablet TAKE AS DIRECTED BY COUMADIN CLINIC 01/13/15  Yes Collene Gobble, MD  nitroGLYCERIN (NITROSTAT) 0.4 MG SL tablet Place 1 tablet (0.4 mg total) under the tongue every 5 (five) minutes as needed for chest pain (up to 3 doses). 08/22/14   Dayna N Dunn, PA-C   BP 131/88 mmHg  Pulse 89  Temp(Src) 98.5 F (36.9 C) (Oral)  Resp 18  Ht 5\' 10"  (1.778 m)  Wt 210 lb (95.255 kg)  BMI 30.13 kg/m2  SpO2 98% Physical Exam  Constitutional: He is oriented to person, place, and time. He appears well-developed and well-nourished.  HENT:  Head: Normocephalic and atraumatic.  Eyes: Pupils are equal, round, and reactive to light.  Neck: Normal range of motion.  Cardiovascular: Normal rate and regular rhythm.   Pulmonary/Chest: Effort normal and breath sounds normal.  Abdominal: Soft. He exhibits no distension. There is no tenderness. There is no rebound and no guarding.  Genitourinary: Rectum normal. Guaiac negative stool.   Musculoskeletal: Normal range of motion. He exhibits no edema.  Neurological: He is alert and oriented to person, place, and time. No cranial nerve deficit.  Skin: Skin is warm and dry. There is pallor.  Psychiatric: He has a normal mood and affect.  Nursing note and vitals reviewed.   ED Course  Procedures (including critical care time) Labs Review Labs Reviewed  COMPREHENSIVE METABOLIC PANEL - Abnormal; Notable for the following:    CO2 20 (*)    BUN 32 (*)    Creatinine, Ser 2.08 (*)    Calcium 8.8 (*)    Total Protein 6.2 (*)    Albumin 3.4 (*)    ALT 16 (*)    Total Bilirubin 0.2 (*)    GFR calc non Af Amer 30 (*)    GFR calc Af Amer 34 (*)    All other components within normal limits  CBC - Abnormal; Notable for the following:    WBC 13.1 (*)    RBC 3.36 (*)    Hemoglobin 7.3 (*)    HCT 24.6 (*)    MCV 73.2 (*)    MCH 21.7 (*)    MCHC 29.7 (*)    RDW 18.3 (*)    All other components within normal limits  PROTIME-INR - Abnormal; Notable for the following:    Prothrombin Time 24.4 (*)    INR 2.22 (*)    All other components within normal limits  RETICULOCYTES - Abnormal; Notable for the following:    RBC. 3.59 (*)    All other components within normal limits  VITAMIN B12  FOLATE  IRON AND TIBC  FERRITIN  POC OCCULT BLOOD, ED  TYPE AND SCREEN  PREPARE RBC (CROSSMATCH)    Imaging Review Portable Chest 1 View  04/16/2015  CLINICAL DATA:  75 year old male with a history of shortness of breath and fatigue for 5 months. EXAM: PORTABLE CHEST 1 VIEW COMPARISON:  CT 06/21/2014, 05/11/2014, chest x-ray 05/11/2014 FINDINGS: Apical lordotic positioning. Cardiomediastinal silhouette unchanged in size and contour. Low lung volumes accentuating the interstitium. No pneumothorax.  No confluent airspace disease. Atherosclerotic calcification of the aortic arch. No displaced fracture. IMPRESSION: Low  lung volumes without evidence of acute cardiopulmonary disease. Signed, Dulcy Fanny. Earleen Newport, DO [facility] Electronically Signed   By: Corrie Mckusick D.O.   On: 04/16/2015 11:43   I have personally reviewed and evaluated these images and lab results as part of my medical decision-making.   EKG Interpretation None      MDM   Final diagnoses:  Anemia, unspecified anemia type    Patient presents with anemia. Likely his been somewhat chronic. Has Coumadin for previous DVTs. Vitals reassuring. Will admit to internal medicine with cardiology consult.    Davonna Belling, MD 04/16/15 1226

## 2015-04-16 NOTE — H&P (Signed)
[facility] History and Physical  [name] [id number] DOB: 12/15/1940 DOA: 04/16/2015  Referring physician: Alvino Chapel PCP:  Melinda Crutch, MD   Chief Complaint: DOE  HPI: [name] is a very pleasant 75 y.o. male with past medical history that includes hypertension, diastolic dysfunction, Addison's disease, lower extremity DVT, GERD/PUD/GIB with anemia presents to the emergency department with the chief complaint abnormal labs. Initial evaluation reveals hemoglobin of 7.3.  Information is obtained from the patient and the chart. Patient reports he's had dyspnea with exertion for several years but has progressed over the last several weeks. Associated symptoms include generalized fatigue hoarseness lower extremity weakness. He states resting makes it better activity makes it worse. He saw pulmonology 2 months ago his note indicates pulmonary evaluation underwhelming. He was then referred to cardiology for further workup. Today they obtain lab work that showed a hemoglobin of 7.2. They recommended he come to the emergency department today. Patient denies chest pain palpitation headache dizziness syncope or near-syncope. He denies fever chills cough lower extremity edema abdominal pain nausea vomiting dysuria hematuria frequency or urgency. He denies melena bright red blood per rectum. He does endorse NSAID use in the form of BC powders but states he does not take these routinely. He reports compliance with his Plavix and Coumadin. He denies any unusual bruising or bleeding. He reports he has had colonoscopy very remote history and EGD 2016. He does not remember the result of either.  Emergency department he's afebrile hemodynamically stable and not hypoxic.   Review of Systems:  10 point review of systems complete and all systems are negative except as indicated in the history of present illness  Past Medical History  Diagnosis Date  . Chronic diastolic CHF (congestive  heart failure) ([place])     a. His initial ejection fraction was between 30 and 35%;  b. 04/2013 Echo: EF 50-55% Gr1 DD, mild-mod MR;  c. 04/2014 Echo: EF 60-65%, Gr 1 DD, triv TR.  Marland Kitchen Thyroid disease   . Pulmonary HTN ([place])   . Hypertension   . Addison's disease ([place])   . Hypogonadism male   . CKD (chronic kidney disease), stage III   . History of aortic insufficiency   . Hx of cardiovascular stress test     a. ETT-Myoview 7/14:  Low risk, inferior defect consistent with thinning, no ischemia, normal wall motion, EF 54%  . DVT (deep venous thrombosis) ([place])     a. 02/2013 superficial thrombophlebitis --> Xarelto later d/c'd 2/2 GIB;  b. 04/2014 Acute Right PT, Peroneal, Popliteal, Femoral, and common femoral DVT-->coumadin  . GERD (gastroesophageal reflux disease)   . H/O hiatal hernia   . Hepatitis     a. 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  . Daily headache   . Chronic back pain   . Peptic ulcer disease   . On home oxygen therapy     a. added 04/2014 in setting of PE.  Marland Kitchen Acute lower GI bleeding     a. admitted to [facility] 04/11/2013;  b. 04/2014 EGD: 1.  gastric erosions, mild prox gastritis and bulbar duodenitis->Protonix.  . Pulmonary embolism ([place])     a. 04/2014 CTA: Bilat submassive PE ->coumadin.  . Coronary artery disease     a. 07/2014 - s/p difficult PCI - had pressure wire analysis of LAD s/p DES in mid segment c/b wire-induced dissection in the distal segment of the LAD which was treated with repeated balloon inflations. Also had DES to rPDA - again  had either spasm distal to the stent placement or an edge dissection but the vessel was small in that area; procedure aborted.  . Obesity   . Anemia    Past Surgical History  Procedure Laterality Date  . US echocardiography  04/09/2010    EF 60-65%  . Tonsillectomy  1940's  . Appendectomy  1950's  . Cholecystectomy  ~ 1965  . Esophagogastroduodenoscopy N/A 04/11/2013    Procedure: ESOPHAGOGASTRODUODENOSCOPY (EGD);  Surgeon: Missy Sabins, MD;  Location: [facility]  ENDOSCOPY;  Service: Endoscopy;  Laterality: N/A;  . Esophagogastroduodenoscopy N/A 05/13/2014    Procedure: ESOPHAGOGASTRODUODENOSCOPY (EGD);  Surgeon: Arta Silence, MD;  Location: [facility] ENDOSCOPY;  Service: Endoscopy;  Laterality: N/A;  . Cardiac catheterization  06/26/2008    EF 30-35%  . Cardiac catheterization N/A 08/20/2014    Procedure: Right/Left Heart Cath and Coronary Angiography;  Surgeon: Jolaine Artist, MD;  Location: [place] CV LAB;  Service: Cardiovascular;  Laterality: N/A;  . Cardiac catheterization N/A 08/21/2014    Procedure: Coronary Stent Intervention;  Surgeon: Wellington Hampshire, MD;  Location: [place] CV LAB;  Service: Cardiovascular;  Laterality: N/A;   Social History:  reports that he has never smoked. He has never used smokeless tobacco. He reports that he does not drink alcohol or use illicit drugs. He is a retired Engineer, drilling. Lives at home with his wife and has done so for the last 58 years. He is independent with ADLs slightly hard of hearing No Known Allergies  Family History  Problem Relation Age of Onset  . Clotting disorder Mother   . Clotting disorder Brother   . Clotting disorder Brother   . Clotting disorder Brother   . Clotting disorder Other     Niece.   He has 4 brothers and 5 1/2 siblings whose collective medical history negative for any Cancer, no one with MI or stroke no diabetes. Other deceased from "clotting disorder"  Prior to Admission medications   Medication Sig Start Date End Date Taking? Authorizing Provider  atorvastatin (LIPITOR) 80 MG tablet Take 1 tablet (80 mg total) by mouth daily. 10/28/14  Yes Thayer Headings, MD  carvedilol (COREG) 6.25 MG tablet Take 1 tablet (6.25 mg total) by mouth 2 (two) times daily with a meal. 12/05/14  Yes Thayer Headings, MD  Cholecalciferol (VITAMIN D3 PO) Take 1,000 capsules by mouth 2 (two) times a week.    Yes Historical Provider, MD  clopidogrel (PLAVIX) 75 MG tablet Take 1 tablet (75 mg  total) by mouth daily. 08/22/14  Yes Dayna N Dunn, PA-C  ezetimibe (ZETIA) 10 MG tablet Take 1 tablet (10 mg total) by mouth daily. 02/28/15  Yes Thayer Headings, MD  fenofibrate (TRICOR) 48 MG tablet Take 1 tablet (48 mg total) by mouth daily. 10/28/14  Yes Thayer Headings, MD  furosemide (LASIX) 20 MG tablet Take 1 tablet (20 mg total) by mouth daily. 04/15/15  Yes Luke K Kilroy, PA-C  gabapentin (NEURONTIN) 600 MG tablet Take 600 mg by mouth daily.   Yes Historical Provider, MD  hydrocortisone (CORTEF) 20 MG tablet Take 20 mg by mouth 2 (two) times daily.    Yes Historical Provider, MD  isosorbide mononitrate (IMDUR) 30 MG 24 hr tablet Take 0.5 tablets (15 mg total) by mouth daily. 09/05/14  Yes Scott Joylene Draft, PA-C  lisinopril (PRINIVIL,ZESTRIL) 10 MG tablet Take 5 mg by mouth daily.   Yes Historical Provider, MD  metoCLOPramide (REGLAN) 5 MG tablet Take 5  mg by mouth daily.    Yes Historical Provider, MD  pantoprazole (PROTONIX) 40 MG tablet Take 1 tablet (40 mg total) by mouth daily. 05/13/14  Yes Donne Hazel, MD  potassium chloride (K-DUR,KLOR-CON) 10 MEQ tablet Take 5 mEq by mouth daily.   Yes Historical Provider, MD  PROAIR HFA 108 (90 BASE) MCG/ACT inhaler INHALE 2 PUFFS INTO THE LUNGS EVERY 6 (SIX) HOURS AS NEEDED FOR WHEEZING OR SHORTNESS OF BREATH. 08/05/14  Yes Collene Gobble, MD  thyroid (ARMOUR) 60 MG tablet Take 60 mg by mouth daily.    Yes Historical Provider, MD  traMADol (ULTRAM) 50 MG tablet Take 100 mg by mouth 3 (three) times daily.    Yes Historical Provider, MD  traZODone (DESYREL) 100 MG tablet Take 100 mg by mouth every evening.  04/16/14  Yes Historical Provider, MD  warfarin (COUMADIN) 3 MG tablet TAKE AS DIRECTED BY COUMADIN CLINIC 01/13/15  Yes Collene Gobble, MD  nitroGLYCERIN (NITROSTAT) 0.4 MG SL tablet Place 1 tablet (0.4 mg total) under the tongue every 5 (five) minutes as needed for chest pain (up to 3 doses). 08/22/14   Charlie Pitter, PA-C   Physical Exam: Filed  Vitals:   04/16/15 0854 04/16/15 1030 04/16/15 1103  BP: 160/86 149/84 146/85  Pulse: 91 89 89  Temp: 98.3 F (36.8 C)    TempSrc: Oral    Resp: 20  12  Height: 5\' 10"  (1.778 m)    Weight: 95.255 kg (210 lb)    SpO2: 99% 97% 97%    Wt Readings from Last 3 Encounters:  04/16/15 95.255 kg (210 lb)  04/15/15 103.42 kg (228 lb)  03/31/15 100.699 kg (222 lb)    General:  Appears calm and comfortable, nourished quite pleasant Eyes: PERRL, normal lids, irises & conjunctiva ENT: grossly normal hearing, lips & tongueIs membranes of his mouth are moist and pink Neck: no LAD, masses or thyromegaly Cardiovascular: RRR, no m/r/g. No LE edema.  Respiratory: CTA bilaterally, no w/r/r. Normal respiratory effort. Abdomen: soft, ntnd, knees positive bowel sounds no guarding or rebounding Skin: no rash or induration seen on limited exam Musculoskeletal: grossly normal tone BUE/BLE, joints without swelling or erythema full range of motion Psychiatric: grossly normal mood and affect, speech fluent and appropriate Neurologic: grossly non-focal. Speech clear facial symmetry           Labs on Admission:  Basic Metabolic Panel:  Recent Labs Lab 04/15/15 1450 04/16/15 0922  NA 143 144  K 4.6 4.0  CL 110 110  CO2 20 20*  GLUCOSE 111* 89  BUN 31* 32*  CREATININE 2.19* 2.08*  CALCIUM 8.8 8.8*   Liver Function Tests:  Recent Labs Lab 04/16/15 0922  AST 17  ALT 16*  ALKPHOS 41  BILITOT 0.2*  PROT 6.2*  ALBUMIN 3.4*   No results for input(s): LIPASE, AMYLASE in the last 168 hours. No results for input(s): AMMONIA in the last 168 hours. CBC:  Recent Labs Lab 04/15/15 1450 04/16/15 0922  WBC 13.0* 13.1*  HGB 7.3* 7.3*  HCT 24.2* 24.6*  MCV 69.5* 73.2*  PLT 452* 386   Cardiac Enzymes: No results for input(s): CKTOTAL, CKMB, CKMBINDEX, TROPONINI in the last 168 hours.  BNP (last 3 results)  Recent Labs  05/10/14 2348  BNP 26.9    ProBNP (last 3 results)  Recent  Labs  05/10/14 1405  PROBNP 24.0    CBG: No results for input(s): GLUCAP in the last 168 hours.  Radiological Exams on Admission: Portable Chest 1 View  04/16/2015  CLINICAL DATA:  75 year old male with a history of shortness of breath and fatigue for 5 months. EXAM: PORTABLE CHEST 1 VIEW COMPARISON:  CT 06/21/2014, 05/11/2014, chest x-ray 05/11/2014 FINDINGS: Apical lordotic positioning. Cardiomediastinal silhouette unchanged in size and contour. Low lung volumes accentuating the interstitium. No pneumothorax.  No confluent airspace disease. Atherosclerotic calcification of the aortic arch. No displaced fracture. IMPRESSION: Low lung volumes without evidence of acute cardiopulmonary disease. Signed, Dulcy Fanny. Earleen Newport, DO [facility] Electronically Signed   By: Corrie Mckusick D.O.   On: 04/16/2015 11:43    EKG:   Assessment/Plan Principal Problem:   DOE (dyspnea on exertion) Active Problems:   Hypothyroidism   Dyspnea on exertion   CKD (chronic kidney disease) stage 3, GFR 30-59 ml/min   Fatigue   Addison disease (HCC)   Peptic ulcer disease   Pulmonary embolus (HCC)   Chronic respiratory failure (HCC)   Chronic diastolic CHF (congestive heart failure) (HCC)   CAD S/P percutaneous coronary angioplasty   Anemia  #1. Dyspnea on exertion. Acute on chronic. Patient has undergone an outpatient workup involving pulmonology and cardiology who opined likely multifactorial. Cardiac versus pulmonary versus anemia. Patient is a nonsmoker. He underwent cardiopulmonary stress test on March 10 report indicates moderate to severely reduced functional capacity and recommended further cardiac workup.  -Admit to telemetry -Obtain chest x-ray -Cardiology consult -Transfuse 1 unit packed RBCs  2. Anemia. Patient with a history of peptic ulcer disease and GI bleed. He's on Plavix and Coumadin due to history of DVT. INR 2.22 on admission.  Reports he does take Goody powders occasionally. 11 7.3 on admission. Chart review indicates 7 months ago hemoglobin 11.1. EGD done April 2016 report indicates no findings seen that would be high risk for bleeding. Colonoscopy done quite remotely does not remember her results. FOBT negative -Obtain an anemia panel -Cycle hemoglobin is -Hold Coumadin and Plavix -Pain PT and INR -Transfuse 1 unit packed RBCs -Hold NSAIDs -Continue PPI -May need GI consult  3. Diastolic heart failure. myoview November 2016 EF 47%. Do not appear overloaded. Home dictation's include Coreg, Lasix, imdur, lisinopril -We'll hold Lasix for now -Monitor intake and output -Obtain daily weight -Await cardiology recommendations  4. CAD status post percutaneous coronary angioplasty 2016. Denies chest pain palpitations. -Obtain EKG -Monitor on telemetry -Continue home meds  5. Chronic kidney disease stage III. Creatinine 2.08 on admission. Chart review indicates this is close to baseline. -Monitor urine output  6. Addison's disease/hypothyroidism. Chronic low-dose steroids. Appears stable at baseline. Recent TSH 1.24 with free T4 0.7 -Continue home meds  [employer] notified  Code Status: full DVT Prophylaxis: Family Communication: none present Disposition Plan: home when ready  Time spent: 67 minutes  [facility]

## 2015-04-16 NOTE — ED Notes (Signed)
Spoke with Apolonio Schneiders in pharmacy, ok to hold medications until meds rescheduled per admission.

## 2015-04-16 NOTE — Telephone Encounter (Signed)
Labs from yesterday reviewed. His Hgb is down to 7.3. He has had significant bleeding in the past on Xarelto and this was changed to Coumadin. INR yesterday in the offcie was 3.4 and he was instructed to hold his Coumadin last pm which he did. I called and spoke with Ms Nitka this am and suggested Mr Verdugo come to [facility] ED to have his labs re checked and for him to plan on admission. We will see him in consult once admitted.   Kerin Ransom PA-C 04/16/2015 8:02 AM

## 2015-04-17 ENCOUNTER — Ambulatory Visit (HOSPITAL_COMMUNITY)
Admission: RE | Admit: 2015-04-17 | Discharge: 2015-04-17 | Disposition: A | Discharge status: Home / Self Care | Payer: BLUE CROSS/BLUE SHIELD | Source: Ambulatory Visit | Attending: Cardiology | Admitting: Cardiology

## 2015-04-17 ENCOUNTER — Observation Stay (HOSPITAL_COMMUNITY): Payer: BLUE CROSS/BLUE SHIELD

## 2015-04-17 DIAGNOSIS — E271 Primary adrenocortical insufficiency: Secondary | ICD-10-CM

## 2015-04-17 DIAGNOSIS — Z7901 Long term (current) use of anticoagulants: Secondary | ICD-10-CM | POA: Diagnosis not present

## 2015-04-17 DIAGNOSIS — I5032 Chronic diastolic (congestive) heart failure: Secondary | ICD-10-CM

## 2015-04-17 DIAGNOSIS — R0609 Other forms of dyspnea: Secondary | ICD-10-CM

## 2015-04-17 DIAGNOSIS — D649 Anemia, unspecified: Secondary | ICD-10-CM | POA: Diagnosis not present

## 2015-04-17 DIAGNOSIS — Z9861 Coronary angioplasty status: Secondary | ICD-10-CM

## 2015-04-17 DIAGNOSIS — E038 Other specified hypothyroidism: Secondary | ICD-10-CM

## 2015-04-17 DIAGNOSIS — R0600 Dyspnea, unspecified: Secondary | ICD-10-CM | POA: Diagnosis not present

## 2015-04-17 DIAGNOSIS — I2510 Atherosclerotic heart disease of native coronary artery without angina pectoris: Secondary | ICD-10-CM | POA: Diagnosis not present

## 2015-04-17 DIAGNOSIS — R0602 Shortness of breath: Secondary | ICD-10-CM

## 2015-04-17 LAB — CBC
HCT: 27.2 % — ABNORMAL LOW (ref 39.0–52.0)
Hemoglobin: 8.1 g/dL — ABNORMAL LOW (ref 13.0–17.0)
MCH: 21.9 pg — ABNORMAL LOW (ref 26.0–34.0)
MCHC: 29.8 g/dL — ABNORMAL LOW (ref 30.0–36.0)
MCV: 73.5 fL — ABNORMAL LOW (ref 78.0–100.0)
Platelets: 395 K/uL (ref 150–400)
RBC: 3.7 MIL/uL — ABNORMAL LOW (ref 4.22–5.81)
RDW: 18.5 % — ABNORMAL HIGH (ref 11.5–15.5)
WBC: 11.4 K/uL — ABNORMAL HIGH (ref 4.0–10.5)

## 2015-04-17 LAB — BASIC METABOLIC PANEL
Anion gap: 11 (ref 5–15)
BUN: 25 mg/dL — ABNORMAL HIGH (ref 6–20)
CALCIUM: 8.6 mg/dL — AB (ref 8.9–10.3)
CHLORIDE: 110 mmol/L (ref 101–111)
CO2: 21 mmol/L — AB (ref 22–32)
CREATININE: 1.89 mg/dL — AB (ref 0.61–1.24)
GFR calc non Af Amer: 33 mL/min — ABNORMAL LOW (ref >60)
GFR, EST AFRICAN AMERICAN: 39 mL/min — AB (ref >60)
GLUCOSE: 92 mg/dL (ref 65–99)
Potassium: 4 mmol/L (ref 3.5–5.1)
Sodium: 142 mmol/L (ref 135–145)

## 2015-04-17 LAB — URINALYSIS, ROUTINE W REFLEX MICROSCOPIC
BILIRUBIN URINE: NEGATIVE
GLUCOSE, UA: NEGATIVE mg/dL
HGB URINE DIPSTICK: NEGATIVE
Ketones, ur: NEGATIVE mg/dL
Leukocytes, UA: NEGATIVE
Nitrite: NEGATIVE
Protein, ur: NEGATIVE mg/dL
SPECIFIC GRAVITY, URINE: 1.01 (ref 1.005–1.030)
pH: 5.5 (ref 5.0–8.0)

## 2015-04-17 LAB — TYPE AND SCREEN
ABO/RH(D): B POS
ANTIBODY SCREEN: NEGATIVE
UNIT DIVISION: 0

## 2015-04-17 LAB — PROTIME-INR
INR: 1.44 (ref 0.00–1.49)
PROTHROMBIN TIME: 17.7 s — AB (ref 11.6–15.2)

## 2015-04-17 MED ORDER — SENNOSIDES-DOCUSATE SODIUM 8.6-50 MG PO TABS: 1.0000 | ORAL_TABLET | Freq: Every day | ORAL | Status: DC

## 2015-04-17 MED ORDER — FERROUS SULFATE 325 (65 FE) MG PO TABS: 325.0000 mg | ORAL_TABLET | Freq: Every day | ORAL | Status: DC

## 2015-04-17 MED ORDER — SODIUM CHLORIDE 0.9 % IV SOLN
510.0000 mg | Freq: Once | INTRAVENOUS | Status: AC
  Administered 2015-04-17: 510 mg via INTRAVENOUS
  Filled 2015-04-17 (×2): qty 17

## 2015-04-17 MED ORDER — WARFARIN - PHARMACIST DOSING INPATIENT: Freq: Every day | Status: DC

## 2015-04-17 MED ORDER — WARFARIN SODIUM 3 MG PO TABS: 3.0000 mg | ORAL_TABLET | Freq: Once | ORAL | Status: DC

## 2015-04-17 MED ORDER — POTASSIUM CHLORIDE 20 MEQ/15ML (10%) PO SOLN
5.0000 meq | Freq: Every day | ORAL | Status: DC
  Administered 2015-04-17 – 2015-04-18 (×2): 5.0667 meq via ORAL
  Filled 2015-04-17 (×2): qty 15

## 2015-04-17 MED ORDER — FOLIC ACID 1 MG PO TABS: 1.0000 mg | ORAL_TABLET | Freq: Every day | ORAL | Status: DC

## 2015-04-17 MED ORDER — FOLIC ACID 1 MG PO TABS
1.0000 mg | ORAL_TABLET | Freq: Every day | ORAL | Status: DC
  Administered 2015-04-17 – 2015-04-18 (×2): 1 mg via ORAL
  Filled 2015-04-17 (×2): qty 1

## 2015-04-17 NOTE — Progress Notes (Signed)
PROGRESS NOTE  [name] [id number] DOB: [birth year] DOA: 04/16/2015 PCP:  Melinda Crutch, MD  HPI/Recap of past 24 hours:  Feeling better, denies sob at rest, no chest pain, no bleeding  Assessment/Plan: Principal Problem:   DOE (dyspnea on exertion) Active Problems:   Hypothyroidism   Dyspnea on exertion   CKD (chronic kidney disease) stage 3, GFR 30-59 ml/min   Fatigue   Addison disease (HCC)   Peptic ulcer disease   Pulmonary embolus (HCC)   Chronic respiratory failure (HCC)   Chronic diastolic CHF (congestive heart failure) ([place])   CAD S/P percutaneous coronary angioplasty   Anemia  #1. Dyspnea on exertion.  Acute on chronic. Patient has undergone an outpatient workup involving pulmonology and cardiology who opioned likely multifactorial. Cardiac versus pulmonary versus anemia. Patient is a nonsmoker. He underwent cardiopulmonary stress test on March 10 report indicates moderate to severely reduced functional capacity and recommended further cardiac workup.  -ekg unremarkable, remain sinus rhythm on telemetry , cxr unremarkable, echocardiogram result pending -Cardiology consulted, input appreciated -s/p 1 unit packed RBCs on 3/22 with appropriate increase of hgb post transfusion.  2. Anemia. Likely combination of malnutrition, and anemia of chronic disease, no overt sign of bleed. FOBt negative, urine no blood. Patient with a history of peptic ulcer disease and GI bleed. He's on Plavix and Coumadin due to history of DVT. INR 2.22 on admission. Reports he does take Goody powders occasionally. 11 7.3 on admission. Chart review indicates 7 months ago hemoglobin 11.1. EGD done April 2016 report indicates no findings seen that would be high risk for bleeding. Colonoscopy done quite remotely does not remember results but he states he was told to repeat colonoscopy in 47yrs.  - anemia panel showed iron deficiency and low folic acid, retic count is also inappropriately  low -coumadin held since admission, resumed on 3/23, due to no overt bleed, FOBt negative, due to h/o recurrent dvt/pe,  -Transfuse 1 unit packed RBCs, s/p iv iron, will discharge on oral iron and folic acid supplement -Hold NSAIDs -Continue PPI -need outpatient GI consult  3. Chronic Diastolic heart failure. myoview November 2016 EF 47%. Do not appear overloaded. Home meds includes Coreg, Lasix, imdur, lisinopril - Lasix held since admission, will resume  -Monitor intake and output -Obtain daily weight -cardiology consulted  4. CAD status post percutaneous coronary angioplasty 2016. Denies chest pain palpitations. - EKG no acute changes -telemetry no arrythmia , echo pending, -Continue home meds, cards consulted  5. Chronic kidney disease stage III. Creatinine 2.08 on admission. Chart review indicates this is close to baseline. -Monitor urine output, patient appear to have some mild memory impairment, he could not tell me whether he sees a nephrologist or not, states his wife takes care all his medical appointments. Per care everywhere, patient is closed followed by nephrologist dr Olivia Mackie at [facility], last visit on 02/05/2015 when his lasix dose was increased from lasix 20mg  qod to daily. Resume lasix that was held since admission, advised patient to return to nephrology due to concerning for anemia of chronic disease.  6. Addison's disease/hypothyroidism. Chronic low-dose steroids. Appears stable at baseline. Recent TSH 1.24 with free T4 0.7 -Continue home meds    Code Status: full DVT Prophylaxis: Family Communication: none present Disposition Plan: home , likely 3/24 if hgb stable   Consultants:  cardiology  Procedures:  prbc transfusion  Iron transfusion  Antibiotics:  none   Objective: BP 130/77 mmHg  Pulse 92  Temp(Src) 98.9 F (  37.2 C) (Oral)  Resp 18  Ht 5\' 10"  (1.778 m)  Wt 98.022 kg (216 lb 1.6 oz)  BMI 31.01 kg/m2  SpO2  97%  Intake/Output Summary (Last 24 hours) at 04/17/15 1524 Last data filed at 04/17/15 1426  Gross per 24 hour  Intake   1089 ml  Output   2175 ml  Net  -1086 ml   Filed Weights   04/16/15 0854 04/16/15 1559 04/17/15 0645  Weight: 95.255 kg (210 lb) 100.7 kg (222 lb 0.1 oz) 98.022 kg (216 lb 1.6 oz)    Exam:   General:  NAD, moon facies likely from chronic steroids  Cardiovascular: RRR  Respiratory: CTABL  Abdomen: Soft/ND/NT, positive BS  Musculoskeletal: No Edema  Neuro: aaox3, but seems to have mild memory impairment  Data Reviewed: Basic Metabolic Panel:  Recent Labs Lab 04/15/15 1450 04/16/15 0922 04/17/15 0400  NA 143 144 142  K 4.6 4.0 4.0  CL 110 110 110  CO2 20 20* 21*  GLUCOSE 111* 89 92  BUN 31* 32* 25*  CREATININE 2.19* 2.08* 1.89*  CALCIUM 8.8 8.8* 8.6*   Liver Function Tests:  Recent Labs Lab 04/16/15 0922  AST 17  ALT 16*  ALKPHOS 41  BILITOT 0.2*  PROT 6.2*  ALBUMIN 3.4*   No results for input(s): LIPASE, AMYLASE in the last 168 hours. No results for input(s): AMMONIA in the last 168 hours. CBC:  Recent Labs Lab 04/15/15 1450 04/16/15 0922 04/16/15 2000 04/17/15 0400  WBC 13.0* 13.1*  --  11.4*  HGB 7.3* 7.3* 8.0* 8.1*  HCT 24.2* 24.6* 27.9* 27.2*  MCV 69.5* 73.2*  --  73.5*  PLT 452* 386  --  395   Cardiac Enzymes:   No results for input(s): CKTOTAL, CKMB, CKMBINDEX, TROPONINI in the last 168 hours. BNP (last 3 results)  Recent Labs  05/10/14 2348  BNP 26.9    ProBNP (last 3 results)  Recent Labs  05/10/14 1405  PROBNP 24.0    CBG: No results for input(s): GLUCAP in the last 168 hours.  No results found for this or any previous visit (from the past 240 hour(s)).   Studies: No results found.  Scheduled Meds: . sodium chloride   Intravenous Once  . atorvastatin  80 mg Oral q1800  . carvedilol  6.25 mg Oral BID WC  . ezetimibe  10 mg Oral Daily  . fenofibrate  54 mg Oral Daily  . ferumoxytol  510  mg Intravenous Once  . folic acid  1 mg Oral Daily  . furosemide  20 mg Oral Daily  . gabapentin  600 mg Oral Daily  . hydrocortisone  20 mg Oral BID  . isosorbide mononitrate  15 mg Oral Daily  . lisinopril  5 mg Oral Daily  . metoCLOPramide  5 mg Oral Daily  . pantoprazole  40 mg Oral Daily  . potassium chloride  5.0667 mEq Oral Daily  . senna-docusate  2 tablet Oral BID  . sodium chloride flush  3 mL Intravenous Q12H  . sodium chloride flush  3 mL Intravenous Q12H  . thyroid  60 mg Oral Daily  . traMADol  100 mg Oral TID  . traZODone  100 mg Oral QPM    Continuous Infusions:    Time spent: 34mins  Nusrat Encarnacion MD, PhD  [facility] Pager [phone]. If 7PM-7AM, please contact night-coverage at [email or web address], password [facility] 04/17/2015, 3:24 PM

## 2015-04-17 NOTE — Progress Notes (Signed)
  Echocardiogram 2D Echocardiogram has been performed.  [name] 04/17/2015, 10:29 AM

## 2015-04-17 NOTE — Progress Notes (Signed)
SATURATION QUALIFICATIONS: (This note is used to comply with regulatory documentation for home oxygen)  Patient Saturations on Room Air at Rest = 97%  Patient Saturations on Room Air while Ambulating = 97%  Patient Saturations on  Liters of oxygen while Ambulating = %  Please briefly explain why patient needs home oxygen:  No O2 required, as pt was on room air while ambulating, with O2 sats at 97%.

## 2015-04-17 NOTE — Progress Notes (Signed)
ANTICOAGULATION CONSULT NOTE - Initial Consult  Pharmacy Consult for coumadin Indication: hx of PE  No Known Allergies  Patient Measurements: Height: 5\' 10"  (177.8 cm) Weight: 216 lb 1.6 oz (98.022 kg) (scale c) IBW/kg (Calculated) : 73  Vital Signs: Temp: 98.9 F (37.2 C) (03/23 1150) Temp Source: Oral (03/23 1150) BP: 130/77 mmHg (03/23 1150) Pulse Rate: 92 (03/23 1150)  Labs:  Recent Labs  04/15/15 1438  04/15/15 1450 04/16/15 0922 04/16/15 2000 04/17/15 0400  HGB  --   < > 7.3* 7.3* 8.0* 8.1*  HCT  --   < > 24.2* 24.6* 27.9* 27.2*  PLT  --   --  452* 386  --  395  LABPROT  --   --   --  24.4*  --   --   INR 3.4  --   --  2.22*  --   --   CREATININE  --   --  2.19* 2.08*  --  1.89*  < > = values in this interval not displayed.  Estimated Creatinine Clearance: 40.3 mL/min (by C-G formula based on Cr of 1.89).   Medical History: Past Medical History  Diagnosis Date  . Chronic diastolic CHF (congestive heart failure) ([place])     a. His initial ejection fraction was between 30 and 35%;  b. 04/2013 Echo: EF 50-55% Gr1 DD, mild-mod MR;  c. 04/2014 Echo: EF 60-65%, Gr 1 DD, triv TR.  Marland Kitchen Thyroid disease   . Pulmonary HTN ([place])   . Hypertension   . Addison's disease ([place])   . Hypogonadism male   . CKD (chronic kidney disease), stage III   . History of aortic insufficiency   . Hx of cardiovascular stress test     a. ETT-Myoview 7/14:  Low risk, inferior defect consistent with thinning, no ischemia, normal wall motion, EF 54%  . DVT (deep venous thrombosis) ([place])     a. 02/2013 superficial thrombophlebitis --> Xarelto later d/c'd 2/2 GIB;  b. 04/2014 Acute Right PT, Peroneal, Popliteal, Femoral, and common femoral DVT-->coumadin  . GERD (gastroesophageal reflux disease)   . H/O hiatal hernia   . Hepatitis     a. 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  . Daily headache   . Chronic back pain   . Peptic ulcer disease   . On home oxygen therapy     a. added 04/2014 in setting of PE.  Marland Kitchen Acute  lower GI bleeding     a. admitted to [facility] 04/11/2013;  b. 04/2014 EGD: 1.  gastric erosions, mild prox gastritis and bulbar duodenitis->Protonix.  . Pulmonary embolism ([place])     a. 04/2014 CTA: Bilat submassive PE ->coumadin.  . Coronary artery disease     a. 07/2014 - s/p difficult PCI - had pressure wire analysis of LAD s/p DES in mid segment c/b wire-induced dissection in the distal segment of the LAD which was treated with repeated balloon inflations. Also had DES to rPDA - again had either spasm distal to the stent placement or an edge dissection but the vessel was small in that area; procedure aborted.  . Obesity   . Anemia     Assessment: 59 YOM with hx of PE on coumadin PTA, admitted with DOE and found to have low hgb 7.3, has been holding coumadin. FOB negative. How hgb 8.1 after 1 unit of PRBC. Pharmacy is consulted to restart coumadin. INR 1.44 today.  PTA dose: 3mg  daily except for 1.5 mg on Monday and Fridays  Goal of  Therapy:  INR 2-3 Monitor platelets by anticoagulation protocol: Yes   Plan:  - Coumadin 3mg  po x 1 tonight - Daily PT/INR  Maryanna Shape, PharmD, BCPS  Clinical Pharmacist  Pager: [phone]   04/17/2015,3:30 PM

## 2015-04-17 NOTE — Progress Notes (Signed)
Hospital Problem List     Principal Problem:   DOE (dyspnea on exertion) Active Problems:   Hypothyroidism   Dyspnea on exertion   CKD (chronic kidney disease) stage 3, GFR 30-59 ml/min   Fatigue   Addison disease (HCC)   Peptic ulcer disease   Pulmonary embolus (HCC)   Chronic respiratory failure (HCC)   Chronic diastolic CHF (congestive heart failure) (HCC)   CAD S/P percutaneous coronary angioplasty   Anemia    Patient Profile:   Primary Cardiologist: [name]  75 y.o. male w/ PMH of CAD (s/p complex DES to LAD, DES to Athena in 07/2014), chronic diastolic CHF, Stage 3 CKD, HTN, Pulmonary HTN, history of PE (occurred in 04/2014, had GI bleed with Xarelto; now on Coumadin) and Addison's Disease who presented to [facility] ED on 04/16/2015 for worsening dyspnea on exertion and newly diagnosed anemia (Hgb 7.3).  Subjective   Says his breathing is similar to yesterday. Denies any chest pain or palpitations. No evidence of active bleeding.  Inpatient Medications    . sodium chloride   Intravenous Once  . atorvastatin  80 mg Oral q1800  . carvedilol  6.25 mg Oral BID WC  . ezetimibe  10 mg Oral Daily  . fenofibrate  54 mg Oral Daily  . furosemide  20 mg Oral Daily  . gabapentin  600 mg Oral Daily  . hydrocortisone  20 mg Oral BID  . isosorbide mononitrate  15 mg Oral Daily  . lisinopril  5 mg Oral Daily  . metoCLOPramide  5 mg Oral Daily  . pantoprazole  40 mg Oral Daily  . potassium chloride  5 mEq Oral Daily  . senna-docusate  2 tablet Oral BID  . sodium chloride flush  3 mL Intravenous Q12H  . sodium chloride flush  3 mL Intravenous Q12H  . thyroid  60 mg Oral Daily  . traMADol  100 mg Oral TID  . traZODone  100 mg Oral QPM    Vital Signs    Filed Vitals:   04/16/15 1559 04/16/15 2231 04/17/15 0217 04/17/15 0645  BP: 127/80 137/74 136/68 150/78  Pulse: 86 86 89 89  Temp: 99 F (37.2 C) 98.3 F (36.8 C) 98.9 F (37.2 C) 99 F (37.2 C)  TempSrc: Oral  Oral Oral Oral  Resp: 18 18 18 18   Height: 5\' 10"  (1.778 m)     Weight: 222 lb 0.1 oz (100.7 kg)   216 lb 1.6 oz (98.022 kg)  SpO2: 95% 94% 93% 95%    Intake/Output Summary (Last 24 hours) at 04/17/15 0825 Last data filed at 04/17/15 0645  Gross per 24 hour  Intake    759 ml  Output   1725 ml  Net   -966 ml   Filed Weights   04/16/15 0854 04/16/15 1559 04/17/15 0645  Weight: 210 lb (95.255 kg) 222 lb 0.1 oz (100.7 kg) 216 lb 1.6 oz (98.022 kg)    Physical Exam    General: Well developed, well nourished, male appearing in no acute distress. Head: Normocephalic, atraumatic.  Neck: Supple without bruits, JVD not elevated. Lungs:  Resp regular and unlabored, CTA without wheezing or rales. Heart: RRR, S1, S2, no S3, S4, or murmur; no rub. Abdomen: Soft, non-tender, non-distended with normoactive bowel sounds. No hepatomegaly. No rebound/guarding. No obvious abdominal masses. Extremities: No clubbing, cyanosis, or edema. Distal pedal pulses are 2+ bilaterally. Neuro: Alert and oriented X 3. Moves all extremities spontaneously. Psych: Normal affect.  Labs    CBC  Recent Labs  04/16/15 0922 04/16/15 2000 04/17/15 0400  WBC 13.1*  --  11.4*  HGB 7.3* 8.0* 8.1*  HCT 24.6* 27.9* 27.2*  MCV 73.2*  --  73.5*  PLT 386  --  [id number]   Basic Metabolic Panel  Recent Labs  04/16/15 0922 04/17/15 0400  NA 144 142  K 4.0 4.0  CL 110 110  CO2 20* 21*  GLUCOSE 89 92  BUN 32* 25*  CREATININE 2.08* 1.89*  CALCIUM 8.8* 8.6*   Liver Function Tests  Recent Labs  04/16/15 0922  AST 17  ALT 16*  ALKPHOS 41  BILITOT 0.2*  PROT 6.2*  ALBUMIN 3.4*   Thyroid Function Tests  Recent Labs  04/15/15 1450  TSH 1.24    Telemetry    NSR HR in 80's - 90's. Tachycardiac into the 110's at times.  ECG    NSR, HR 93, No acute ST or T-wave changes.    Cardiac Studies and Radiology    Portable Chest 1 View: 04/16/2015  CLINICAL DATA:  75 year old male with a history of shortness  of breath and fatigue for 5 months. EXAM: PORTABLE CHEST 1 VIEW COMPARISON:  CT 06/21/2014, 05/11/2014, chest x-ray 05/11/2014 FINDINGS: Apical lordotic positioning. Cardiomediastinal silhouette unchanged in size and contour. Low lung volumes accentuating the interstitium. No pneumothorax.  No confluent airspace disease. Atherosclerotic calcification of the aortic arch. No displaced fracture. IMPRESSION: Low lung volumes without evidence of acute cardiopulmonary disease. Signed, Dulcy Fanny. Earleen Newport, DO [facility] Electronically Signed   By: Corrie Mckusick D.O.   On: 04/16/2015 11:43    Echocardiogram: Pending  Assessment & Plan    1. Dyspnea with exertion - reports 2 months of worsening dyspnea with exertion. Recently seen by Dr. Lamonte Sakai and underwent a cardio-pulmonary stress test on 04/03/2015 which showed a " moderate to severly reduced functional capacity due primarily to a circulatory limitation".  - found to have a Hgb of 7.3 (down from, 11.1 seven months ago). Transfused 1 unit pRBC's thus far.  - repeat echocardiogram to assess LV function is pending and is scheduled to be performed today. - would see how his dyspnea responds to improvement of his anemia. If his EF is significantly reduced on echo, he would require further cardiac workup once his anemia and kidney function improves.  2. History of CAD - s/p complex DES to LAD, DES to rPDA in [id number] which was complicated by a wire-induced dissection - denies any current chest pain or radiating pain. Last NST was in 11/2014 and showed a small defect of mild severity in the apex.  - continue statin, BB, Zetia, Fenofibrate (statin intolerant), ACE-I, and Imdur. Per Dr. Burt Knack, his Plavix can be stopped in the long-term due to being 6 months out from PCI.  3. Anemia - Hgb 7.3 in the office on 04/15/2015. Previously 11.1 seven months ago. - Received 1 unit pRBC's with Hgb at 8.1 this AM. -  per admitting team  4. Chronic diastolic CHF  - echo in [id number] showed EF of 60-65%. Was read as 47% by NST in 11/2014. - repeat echocardiogram is pending. Does not appear to be volume overloaded on physical exam. - continue BB, ACE-I, and PO Lasix.   5. Stage 3 CKD - creatinine at 1.89 on 04/17/2015. Similar to previous values in 2016. If creatinine worsens, would hold ACE-I. - continue to monitor.  6. History of PE - occurred in 04/2014,  had GI bleed with Xarelto and was switched to Coumadin - Coumadin currently held in the setting of his anemia.   Signed, Erma Heritage , PA-C 8:25 AM 04/17/2015 Pager: [phone]  Patient seen, examined. Available data reviewed. Agree with findings, assessment, and plan as outlined by Bernerd Pho, PA-C. The patient is clinically improved. Awaiting his echo report. Exam as outlined above and changes made where appropriate. I suspect he has symptomatic anemia. His hemoglobin is at a proximally 8 mg/dL after transfusion. His baseline is greater than 11. Will defer to the primary team regarding further transfusion is indicated. If he does receive another unit of blood, I would give him IV diuresis with that. Otherwise will await echo report and continue current management. It would be reasonable to transition him from Plavix to aspirin 81 mg daily at this point.  Sherren Mocha, M.D. 04/17/2015 11:30 AM

## 2015-04-18 DIAGNOSIS — Z7901 Long term (current) use of anticoagulants: Secondary | ICD-10-CM | POA: Diagnosis not present

## 2015-04-18 DIAGNOSIS — R0609 Other forms of dyspnea: Secondary | ICD-10-CM | POA: Diagnosis not present

## 2015-04-18 DIAGNOSIS — N183 Chronic kidney disease, stage 3 (moderate): Secondary | ICD-10-CM

## 2015-04-18 DIAGNOSIS — R0600 Dyspnea, unspecified: Secondary | ICD-10-CM | POA: Diagnosis not present

## 2015-04-18 DIAGNOSIS — I2510 Atherosclerotic heart disease of native coronary artery without angina pectoris: Secondary | ICD-10-CM | POA: Diagnosis not present

## 2015-04-18 DIAGNOSIS — Z8619 Personal history of other infectious and parasitic diseases: Secondary | ICD-10-CM

## 2015-04-18 DIAGNOSIS — E271 Primary adrenocortical insufficiency: Secondary | ICD-10-CM | POA: Diagnosis not present

## 2015-04-18 LAB — BASIC METABOLIC PANEL
ANION GAP: 10 (ref 5–15)
BUN: 24 mg/dL — ABNORMAL HIGH (ref 6–20)
CALCIUM: 8.7 mg/dL — AB (ref 8.9–10.3)
CO2: 23 mmol/L (ref 22–32)
CREATININE: 1.99 mg/dL — AB (ref 0.61–1.24)
Chloride: 111 mmol/L (ref 101–111)
GFR, EST AFRICAN AMERICAN: 36 mL/min — AB (ref >60)
GFR, EST NON AFRICAN AMERICAN: 31 mL/min — AB (ref >60)
Glucose, Bld: 124 mg/dL — ABNORMAL HIGH (ref 65–99)
Potassium: 3.8 mmol/L (ref 3.5–5.1)
SODIUM: 144 mmol/L (ref 135–145)

## 2015-04-18 LAB — CBC WITH DIFFERENTIAL/PLATELET
BASOS ABS: 0.1 10*3/uL (ref 0.0–0.1)
BASOS PCT: 0 %
Eosinophils Absolute: 0.5 10*3/uL (ref 0.0–0.7)
Eosinophils Relative: 3 %
HEMATOCRIT: 30.2 % — AB (ref 39.0–52.0)
HEMOGLOBIN: 8.5 g/dL — AB (ref 13.0–17.0)
Lymphocytes Relative: 22 %
Lymphs Abs: 3 10*3/uL (ref 0.7–4.0)
MCH: 21 pg — ABNORMAL LOW (ref 26.0–34.0)
MCHC: 28.1 g/dL — ABNORMAL LOW (ref 30.0–36.0)
MCV: 74.8 fL — ABNORMAL LOW (ref 78.0–100.0)
MONOS PCT: 11 %
Monocytes Absolute: 1.5 10*3/uL — ABNORMAL HIGH (ref 0.1–1.0)
NEUTROS ABS: 8.7 10*3/uL — AB (ref 1.7–7.7)
Neutrophils Relative %: 64 %
Platelets: 421 10*3/uL — ABNORMAL HIGH (ref 150–400)
RBC: 4.04 MIL/uL — AB (ref 4.22–5.81)
RDW: 19.1 % — ABNORMAL HIGH (ref 11.5–15.5)
WBC: 13.7 10*3/uL — AB (ref 4.0–10.5)

## 2015-04-18 LAB — PROTIME-INR
INR: 1.33 (ref 0.00–1.49)
Prothrombin Time: 16.6 seconds — ABNORMAL HIGH (ref 11.6–15.2)

## 2015-04-18 MED ORDER — WARFARIN SODIUM 3 MG PO TABS: ORAL_TABLET | ORAL | Status: DC

## 2015-04-18 MED ORDER — ASPIRIN EC 81 MG PO TBEC
81.0000 mg | DELAYED_RELEASE_TABLET | Freq: Every day | ORAL | Status: DC
  Administered 2015-04-18: 81 mg via ORAL
  Filled 2015-04-18: qty 1

## 2015-04-18 MED ORDER — WARFARIN SODIUM 5 MG PO TABS: 5.0000 mg | ORAL_TABLET | Freq: Once | ORAL | Status: DC

## 2015-04-18 MED ORDER — ASPIRIN 81 MG PO TBEC: 81.0000 mg | DELAYED_RELEASE_TABLET | Freq: Every day | ORAL | Status: DC

## 2015-04-18 NOTE — Care Management Obs Status (Signed)
[place] NOTIFICATION   Patient Details  Name: [name] MRN: [id number] Date of Birth: 1940/12/15   Medicare Observation Status Notification Given:  Yes    Royston Bake, RN 04/18/2015, 11:29 AM

## 2015-04-18 NOTE — Discharge Summary (Signed)
Discharge Summary  [name] [id number] DOB: 1940-12-14  PCP:  Melinda Crutch, MD  Admit date: 04/16/2015 Discharge date: 04/18/2015  Time spent: >65mins  Recommendations for Outpatient Follow-up:  1. Please have INR checked on Monday and follow up with coumadin clinic for further dose adjustment  2. F/u with PMD within a week  for hospital discharge follow up, repeat cbc/bmp at follow up, pmd to refer patient to GI.  3. F/u with cardiology for cad 4. F/u with nephrology for ckd, anemia of chronic disease 5. Medication changes: d/c plavix, start asa, iron and folic acid supplement.  Discharge Diagnoses:  Active Hospital Problems   Diagnosis Date Noted  . DOE (dyspnea on exertion) 04/16/2015  . Chronic anticoagulation   . Anemia 04/16/2015  . CAD S/P percutaneous coronary angioplasty 09/05/2014  . Chronic diastolic CHF (congestive heart failure) ([place]) 08/01/2014  . Chronic respiratory failure ([place]) 05/17/2014  . Pulmonary embolus ([place]) 05/11/2014  . Peptic ulcer disease 04/12/2013  . Addison disease ([place]) 04/11/2013  . CKD (chronic kidney disease) stage 3, GFR 30-59 ml/min 04/16/2010  . Fatigue 04/16/2010  . Hypothyroidism 05/08/2009  . Dyspnea on exertion 05/08/2009    Resolved Hospital Problems   Diagnosis Date Noted Date Resolved  No resolved problems to display.    Discharge Condition: stable  Diet recommendation: heart healthy  Filed Weights   04/16/15 1559 04/17/15 0645 04/18/15 0351  Weight: 100.7 kg (222 lb 0.1 oz) 98.022 kg (216 lb 1.6 oz) 98.657 kg (217 lb 8 oz)    History of present illness:  [name] is a very pleasant 75 y.o. male with past medical history that includes hypertension, diastolic dysfunction, Addison's disease, lower extremity DVT, GERD/PUD/GIB with anemia presents to the emergency department with the chief complaint abnormal labs. Initial evaluation reveals hemoglobin of 7.3.  Information is obtained from the patient and  the chart. Patient reports he's had dyspnea with exertion for several years but has progressed over the last several weeks. Associated symptoms include generalized fatigue hoarseness lower extremity weakness. He states resting makes it better activity makes it worse. He saw pulmonology 2 months ago his note indicates pulmonary evaluation underwhelming. He was then referred to cardiology for further workup. Today they obtain lab work that showed a hemoglobin of 7.2. They recommended he come to the emergency department today. Patient denies chest pain palpitation headache dizziness syncope or near-syncope. He denies fever chills cough lower extremity edema abdominal pain nausea vomiting dysuria hematuria frequency or urgency. He denies melena bright red blood per rectum. He does endorse NSAID use in the form of BC powders but states he does not take these routinely. He reports compliance with his Plavix and Coumadin. He denies any unusual bruising or bleeding. He reports he has had colonoscopy very remote history and EGD 2016. He does not remember the result of either.   Hospital Course:  Principal Problem:   DOE (dyspnea on exertion) Active Problems:   Hypothyroidism   Dyspnea on exertion   CKD (chronic kidney disease) stage 3, GFR 30-59 ml/min   Fatigue   Addison disease (HCC)   Peptic ulcer disease   Pulmonary embolus (HCC)   Chronic respiratory failure (HCC)   Chronic diastolic CHF (congestive heart failure) (HCC)   CAD S/P percutaneous coronary angioplasty   Anemia   Chronic anticoagulation  #1. Dyspnea on exertion.  Acute on chronic. Patient has undergone an outpatient workup involving pulmonology and cardiology who opioned likely multifactorial. Cardiac versus pulmonary versus anemia.  Patient is a nonsmoker. He underwent cardiopulmonary stress test on March 10 report indicates moderate to severely reduced functional capacity and recommended further cardiac workup.  -ekg unremarkable,  remain sinus rhythm on telemetry , cxr unremarkable, echocardiogram no significant interval changes -Cardiology consulted, input appreciated -s/p 1 unit packed RBCs on 3/22 with appropriate increase of hgb post transfusion.  2. Anemia. Likely combination of malnutrition, and anemia of chronic disease, no overt sign of bleed. FOBT negative, urine no blood. Patient with a history of peptic ulcer disease and GI bleed. He's on Plavix and Coumadin due to history of DVT. INR 2.22 on admission. Reports he does take Goody powders occasionally. 11 7.3 on admission. Chart review indicates 7 months ago hemoglobin 11.1. EGD done April 2016 report indicates no findings seen that would be high risk for bleeding. Colonoscopy done in 2001 with internal hemorrhoids.   - anemia panel showed iron deficiency and low folic acid, retic count is also inappropriately low -coumadin held since admission, resumed on 3/23, due to no overt bleed, FOBT negative, due to h/o recurrent dvt/pe,  -Transfuse 1 unit packed RBCs, s/p iv iron, discharged on oral iron and folic acid supplement -avoid NSAIDs -Continue PPI -PMD to refer patient to GI to consider colonoscopy  3. Chronic Diastolic heart failure. myoview November 2016 EF 47%. Do not appear overloaded. Home meds includes Coreg, Lasix, imdur, lisinopril - Lasix held since admission, resumed at discharge -Monitor intake and output -euvolemic at discharge, no edema, lung clear -cardiology input appreciated.  4. CAD status post percutaneous coronary angioplasty 2016. Denies chest pain palpitations. - EKG no acute changes -telemetry no arrythmia , repeat echo no significant interval changes , -Continue home meds, cards consulted, cleared patient to be discharged on asa, d/c plavix,   5. Chronic kidney disease stage III. Creatinine 2.08 on admission. Chart review indicates this is close to baseline. -Monitor urine output, patient appear to have some mild memory  impairment, he could not tell me whether he sees a nephrologist or not, states his wife takes care all his medical appointments. Per care everywhere, patient is closed followed by nephrologist dr Olivia Mackie at [facility], last visit on 02/05/2015 when his lasix dose was increased from lasix 20mg  qod to daily. Resume lasix that was held since admission, advised patient to return to nephrology due to concerning for anemia of chronic disease.  6. Addison's disease/hypothyroidism. Chronic low-dose steroids. Appears stable at baseline. Recent TSH 1.24 with free T4 0.7 -Continue home meds    Code Status: full  Family Communication: patient and wife Disposition Plan: home  3/24    Consultants:  cardiology  Procedures:  prbc transfusionx1 on 3/22  Iron transfusion x1 with feraheme on 3/23  Antibiotics:  none  Discharge Exam: BP 156/79 mmHg  Pulse 89  Temp(Src) 98.4 F (36.9 C) (Oral)  Resp 18  Ht 5\' 10"  (1.778 m)  Wt 98.657 kg (217 lb 8 oz)  BMI 31.21 kg/m2  SpO2 95%   General: NAD, moon facies likely from chronic steroids  Cardiovascular: RRR  Respiratory: CTABL  Abdomen: Soft/ND/NT, positive BS  Musculoskeletal: No Edema  Neuro: aaox3, but seems to have mild memory impairment   Discharge Instructions You were cared for by a hospitalist during your hospital stay. If you have any questions about your discharge medications or the care you received while you were in the hospital after you are discharged, you can call the unit and asked to speak with the hospitalist on call if the  hospitalist that took care of you is not available. Once you are discharged, your primary care physician will handle any further medical issues. Please note that NO REFILLS for any discharge medications will be authorized once you are discharged, as it is imperative that you return to your primary care physician (or establish a relationship with a primary care physician if you do not have  one) for your aftercare needs so that they can reassess your need for medications and monitor your lab values.  Discharge Instructions    Diet - low sodium heart healthy    Complete by:  As directed      Increase activity slowly    Complete by:  As directed             Medication List    STOP taking these medications        clopidogrel 75 MG tablet  Commonly known as:  PLAVIX      TAKE these medications        aspirin 81 MG EC tablet  Take 1 tablet (81 mg total) by mouth daily.     atorvastatin 80 MG tablet  Commonly known as:  LIPITOR  Take 1 tablet (80 mg total) by mouth daily.     carvedilol 6.25 MG tablet  Commonly known as:  COREG  Take 1 tablet (6.25 mg total) by mouth 2 (two) times daily with a meal.     ezetimibe 10 MG tablet  Commonly known as:  ZETIA  Take 1 tablet (10 mg total) by mouth daily.     fenofibrate 48 MG tablet  Commonly known as:  TRICOR  Take 1 tablet (48 mg total) by mouth daily.     ferrous sulfate 325 (65 FE) MG tablet  Take 1 tablet (325 mg total) by mouth daily with breakfast.     folic acid 1 MG tablet  Commonly known as:  FOLVITE  Take 1 tablet (1 mg total) by mouth daily.     furosemide 20 MG tablet  Commonly known as:  LASIX  Take 1 tablet (20 mg total) by mouth daily.     gabapentin 600 MG tablet  Commonly known as:  NEURONTIN  Take 600 mg by mouth daily.     hydrocortisone 20 MG tablet  Commonly known as:  CORTEF  Take 20 mg by mouth 2 (two) times daily.     isosorbide mononitrate 30 MG 24 hr tablet  Commonly known as:  IMDUR  Take 0.5 tablets (15 mg total) by mouth daily.     lisinopril 10 MG tablet  Commonly known as:  PRINIVIL,ZESTRIL  Take 5 mg by mouth daily.     metoCLOPramide 5 MG tablet  Commonly known as:  REGLAN  Take 5 mg by mouth daily.     nitroGLYCERIN 0.4 MG SL tablet  Commonly known as:  NITROSTAT  Place 1 tablet (0.4 mg total) under the tongue every 5 (five) minutes as needed for chest pain  (up to 3 doses).     pantoprazole 40 MG tablet  Commonly known as:  PROTONIX  Take 1 tablet (40 mg total) by mouth daily.     potassium chloride 10 MEQ tablet  Commonly known as:  K-DUR,KLOR-CON  Take 5 mEq by mouth daily.     PROAIR HFA 108 (90 Base) MCG/ACT inhaler  Generic drug:  albuterol  INHALE 2 PUFFS INTO THE LUNGS EVERY 6 (SIX) HOURS AS NEEDED FOR WHEEZING OR SHORTNESS OF BREATH.  senna-docusate 8.6-50 MG tablet  Commonly known as:  Senokot-S  Take 1 tablet by mouth at bedtime.     thyroid 60 MG tablet  Commonly known as:  ARMOUR  Take 60 mg by mouth daily.     traMADol 50 MG tablet  Commonly known as:  ULTRAM  Take 100 mg by mouth 3 (three) times daily.     traZODone 100 MG tablet  Commonly known as:  DESYREL  Take 100 mg by mouth every evening.     VITAMIN D3 PO  Take 1,000 capsules by mouth 2 (two) times a week.     warfarin 3 MG tablet  Commonly known as:  COUMADIN  Please take 6mg  on 3/24, then go back to home dose 3mg  every day except Monday and Friday . On Monday and Friday take 1.5mg . Have INR checked on Monday 3/27.       No Known Allergies     Follow-up Information    Follow up with LUFADEJU, ADETOYE, MD In 1 month.   Specialty:  Nephrology   Why:  CKD III and anemia of chronic disease   Contact information:   [id number] [place] [address] [phone]       Follow up with  Melinda Crutch, MD. Go on 04/29/2015.   Specialty:  Family Medicine   Why:  hospital discharge follow up, repeat cbc/bmp, pmd to refer patient to GI for possible endoscope to r/o GI bleed.    Contact information:   [place] [address] [phone]       Follow up with  Melinda Crutch, MD. Go on 04/29/2015.   Specialty:  Family Medicine   Why:  @2 :15PM   Contact information:   [place] [address] [phone]       Follow up with [name], Wonda Cheng, MD On 04/30/2015.   Specialty:  Cardiology   Why:  Cardiology  Follow-Up on 04/30/2015 at 10:15AM.   Contact information:   [place]  [address] [phone]        The results of significant diagnostics from this hospitalization (including imaging, microbiology, ancillary and laboratory) are listed below for reference.    Significant Diagnostic Studies: Portable Chest 1 View  04/16/2015  CLINICAL DATA:  75 year old male with a history of shortness of breath and fatigue for 5 months. EXAM: PORTABLE CHEST 1 VIEW COMPARISON:  CT 06/21/2014, 05/11/2014, chest x-ray 05/11/2014 FINDINGS: Apical lordotic positioning. Cardiomediastinal silhouette unchanged in size and contour. Low lung volumes accentuating the interstitium. No pneumothorax.  No confluent airspace disease. Atherosclerotic calcification of the aortic arch. No displaced fracture. IMPRESSION: Low lung volumes without evidence of acute cardiopulmonary disease. Signed, Dulcy Fanny. Earleen Newport, DO [facility]iology Electronically Signed   By: Corrie Mckusick D.O.   On: 04/16/2015 11:43    Microbiology: No results found for this or any previous visit (from the past 240 hour(s)).   Labs: Basic Metabolic Panel:  Recent Labs Lab 04/15/15 1450 04/16/15 0922 04/17/15 0400 04/18/15 0430  NA 143 144 142 144  K 4.6 4.0 4.0 3.8  CL 110 110 110 111  CO2 20 20* 21* 23  GLUCOSE 111* 89 92 124*  BUN 31* 32* 25* 24*  CREATININE 2.19* 2.08* 1.89* 1.99*  CALCIUM 8.8 8.8* 8.6* 8.7*   Liver Function Tests:  Recent Labs Lab 04/16/15 0922  AST 17  ALT 16*  ALKPHOS 41  BILITOT  0.2*  PROT 6.2*  ALBUMIN 3.4*   No results for input(s): LIPASE, AMYLASE in the last 168 hours. No results for input(s): AMMONIA in the last 168 hours. CBC:  Recent Labs Lab 04/15/15 1450 04/16/15 0922 04/16/15 2000 04/17/15 0400 04/18/15 0430  WBC 13.0* 13.1*  --  11.4* 13.7*  NEUTROABS  --   --   --   --  8.7*  HGB 7.3* 7.3* 8.0* 8.1* 8.5*    HCT 24.2* 24.6* 27.9* 27.2* 30.2*  MCV 69.5* 73.2*  --  73.5* 74.8*  PLT 452* 386  --  395 421*   Cardiac Enzymes: No results for input(s): CKTOTAL, CKMB, CKMBINDEX, TROPONINI in the last 168 hours. BNP: BNP (last 3 results)  Recent Labs  05/10/14 2348  BNP 26.9    ProBNP (last 3 results)  Recent Labs  05/10/14 1405  PROBNP 24.0    CBG: No results for input(s): GLUCAP in the last 168 hours.     SignedFlorencia Reasons MD, PhD  [facility] 04/18/2015, 10:05 AM

## 2015-04-18 NOTE — Progress Notes (Addendum)
ANTICOAGULATION CONSULT NOTE  Pharmacy Consult for coumadin Indication: hx of PE  No Known Allergies  Patient Measurements: Height: 5\' 10"  (177.8 cm) Weight: 217 lb 8 oz (98.657 kg) (c scale) IBW/kg (Calculated) : 73  Vital Signs: Temp: 98.4 F (36.9 C) (03/24 0351) Temp Source: Oral (03/24 0351) BP: 138/74 mmHg (03/24 0351) Pulse Rate: 86 (03/24 0351)  Labs:  Recent Labs  04/16/15 0922 04/16/15 2000 04/17/15 0400 04/17/15 1536 04/18/15 0430  HGB 7.3* 8.0* 8.1*  --  8.5*  HCT 24.6* 27.9* 27.2*  --  30.2*  PLT 386  --  395  --  421*  LABPROT 24.4*  --   --  17.7* 16.6*  INR 2.22*  --   --  1.44 1.33  CREATININE 2.08*  --  1.89*  --  1.99*    Estimated Creatinine Clearance: 38.4 mL/min (by C-G formula based on Cr of 1.99).   Assessment: 68 YOM with hx of PE on coumadin PTA, admitted with DOE and found to have low hgb 7.3- have been holding warfarin, now to resume.  Warfarin ordered last night, but not given. INR 1.33 today. Hgb 8.5, plts 421- no evidence of active bleeding.  PTA dose: 3mg  daily except for 1.5 mg on Monday and Fridays  Goal of Therapy:  INR 2-3 Monitor platelets by anticoagulation protocol: Yes   Plan:  - Coumadin 5mg  po x 1 tonight - Daily PT/INR - Follow for s/s bleeding  Nilani Hugill D. Marlette Curvin, PharmD, BCPS Clinical Pharmacist Pager: [phone] 04/18/2015 8:29 AM   ADDENDUM Spoke with Dr. Erlinda Hong- patient to be discharged today. Recommend patient take warfarin 6mg  po x1 tonight (2 tablets), then resume home dose tomorrow 3/25- 3mg  daily except 1.5mg  on Mondays and Fridays.  INR check on Monday 3/27 or Tuesday 3/28  Genelle Economou D. Lauranne Beyersdorf, PharmD, BCPS Clinical Pharmacist Pager: [phone] 04/18/2015 9:25 AM

## 2015-04-18 NOTE — Progress Notes (Signed)
Orders received for pt discharge.  Discharge summary printed and reviewed with pt.  Explained medication regimen, and pt had no further questions at this time.  IV removed and site remains clean, dry, intact.  Telemetry removed.  Pt in stable condition and awaiting transport. 

## 2015-04-21 ENCOUNTER — Telehealth: Payer: Self-pay | Admitting: Cardiology

## 2015-04-21 NOTE — Telephone Encounter (Signed)
New Message:  [name] wife called in stating that she spoke to [facility] yesterday and he advised her to take him to the hospital because his Hemoglobin was at a 7. While he was there she says that he had a few transfusions and now she would like to know where is levels are. Please f/u with her

## 2015-04-21 NOTE — Telephone Encounter (Signed)
New Message:  [name] wife called in stating that [name] advised the pt to go to the hospital because his Hemoglobin was at a 7. She says that he had some transfusions while he was there and now she would like to know where his levels are now. Please f/u with her

## 2015-04-21 NOTE — Telephone Encounter (Signed)
Informed wife that Hgb was at 8.5 on Friday. She wants Kerin Ransom, [place] to know that they are so appreciative of him and how wonderful he was.  I will forward to him for his FYI. She thanks me for calling back so quickly.

## 2015-04-22 ENCOUNTER — Telehealth: Payer: Self-pay | Admitting: Cardiovascular Disease

## 2015-04-22 NOTE — Telephone Encounter (Signed)
New message      Pt was dx with anemia by Lurena Joiner recently.  When should pt expect to feel better. He is really sob. Also, should pt be taking an 81mg  aspirin?

## 2015-04-22 NOTE — Telephone Encounter (Signed)
Spoke with patient's wife, Carlyon Shadow, who called to ask if it is normal for patient to continue to be so short of breath.  I advised her that it will take time to recover from the anemia, hgb is currently 8.5.  Patient also has history of diastolic chf and hx pulmonary embolus and has suffered for some time for dyspnea.  I encouraged her that it will take some time for him to return to baseline and that he should continue current medications and follow-up with [name] next week as planned.  Darlene verbalized understanding and agreement and thanked me for the call.

## 2015-04-30 ENCOUNTER — Telehealth: Payer: Self-pay | Admitting: Cardiovascular Disease

## 2015-04-30 ENCOUNTER — Ambulatory Visit (INDEPENDENT_AMBULATORY_CARE_PROVIDER_SITE_OTHER): Payer: BLUE CROSS/BLUE SHIELD | Admitting: Cardiovascular Disease

## 2015-04-30 ENCOUNTER — Encounter: Payer: Self-pay | Admitting: Cardiovascular Disease

## 2015-04-30 ENCOUNTER — Ambulatory Visit (INDEPENDENT_AMBULATORY_CARE_PROVIDER_SITE_OTHER): Payer: BLUE CROSS/BLUE SHIELD | Admitting: *Deleted

## 2015-04-30 VITALS — BP 120/80 | HR 88 | Ht 70.0 in | Wt 223.0 lb

## 2015-04-30 DIAGNOSIS — I82401 Acute embolism and thrombosis of unspecified deep veins of right lower extremity: Secondary | ICD-10-CM | POA: Diagnosis not present

## 2015-04-30 DIAGNOSIS — Z9861 Coronary angioplasty status: Secondary | ICD-10-CM | POA: Diagnosis not present

## 2015-04-30 DIAGNOSIS — I2699 Other pulmonary embolism without acute cor pulmonale: Secondary | ICD-10-CM | POA: Diagnosis not present

## 2015-04-30 DIAGNOSIS — I2782 Chronic pulmonary embolism: Secondary | ICD-10-CM | POA: Diagnosis not present

## 2015-04-30 DIAGNOSIS — I2510 Atherosclerotic heart disease of native coronary artery without angina pectoris: Secondary | ICD-10-CM | POA: Diagnosis not present

## 2015-04-30 DIAGNOSIS — I5032 Chronic diastolic (congestive) heart failure: Secondary | ICD-10-CM

## 2015-04-30 DIAGNOSIS — Z5181 Encounter for therapeutic drug level monitoring: Secondary | ICD-10-CM

## 2015-04-30 LAB — POCT INR: INR: 1.9

## 2015-04-30 MED ORDER — FUROSEMIDE 20 MG PO TABS: ORAL_TABLET | ORAL | Status: DC

## 2015-04-30 NOTE — Patient Instructions (Signed)
Medication Instructions:  DECREASE Lasix to 20 mg 3 times per week TAKE Kdur 10 meq - take 1/2 tab 3 times per week on the same day you take lasix   Labwork: Please have lab work faxed to [phone] Attention [name]   Testing/Procedures: None Ordered   Follow-Up: Your physician wants you to follow-up in: 6 months with [name].  You will receive a reminder letter in the mail two months in advance. If you don't receive a letter, please call our office to schedule the follow-up appointment.   If you need a refill on your cardiac medications before your next appointment, please call your pharmacy.   Thank you for choosing [facility]! Christen Bame, RN [phone]

## 2015-04-30 NOTE — Telephone Encounter (Signed)
New Message:   Pt just saw Dr Donata Duff wants to tell you something about his hemoglobin.

## 2015-04-30 NOTE — Telephone Encounter (Signed)
Spoke with patient's wife who called to let us know that per Dr. Harrington Challenger, patient's hemoglobin is 10.5.  I thanked her for the call.

## 2015-04-30 NOTE — Progress Notes (Signed)
Patient Name: [name] Date of Encounter: 04/30/2015  Primary Care Provider:   Melinda Crutch, MD Primary Cardiologist:  Joaquim Nam, MD   Chief Complaint  75 year old male who presents for hospital follow-up related to recent admission for bilateral PE and right lower extremity DVT.  Past Medical History   Past Medical History  Diagnosis Date  . Chronic diastolic CHF (congestive heart failure) ([place])     a. His initial ejection fraction was between 30 and 35%;  b. 04/2013 Echo: EF 50-55% Gr1 DD, mild-mod MR;  c. 04/2014 Echo: EF 60-65%, Gr 1 DD, triv TR.  Marland Kitchen Thyroid disease   . Pulmonary HTN ([place])   . Hypertension   . Addison's disease ([place])   . Hypogonadism male   . CKD (chronic kidney disease), stage III   . History of aortic insufficiency   . Hx of cardiovascular stress test     a. ETT-Myoview 7/14:  Low risk, inferior defect consistent with thinning, no ischemia, normal wall motion, EF 54%  . DVT (deep venous thrombosis) ([place])     a. 02/2013 superficial thrombophlebitis --> Xarelto later d/c'd 2/2 GIB;  b. 04/2014 Acute Right PT, Peroneal, Popliteal, Femoral, and common femoral DVT-->coumadin  . GERD (gastroesophageal reflux disease)   . H/O hiatal hernia   . Hepatitis     a. 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  . Daily headache   . Chronic back pain   . Peptic ulcer disease   . On home oxygen therapy     a. added 04/2014 in setting of PE.  Marland Kitchen Acute lower GI bleeding     a. admitted to [facility] 04/11/2013;  b. 04/2014 EGD: 1.  gastric erosions, mild prox gastritis and bulbar duodenitis->Protonix.  . Pulmonary embolism ([place])     a. 04/2014 CTA: Bilat submassive PE ->coumadin.  . Coronary artery disease     a. 07/2014 - s/p difficult PCI - had pressure wire analysis of LAD s/p DES in mid segment c/b wire-induced dissection in the distal segment of the LAD which was treated with repeated balloon inflations. Also had DES to rPDA - again had either spasm distal to the stent placement or an edge dissection  but the vessel was small in that area; procedure aborted.  . Obesity   . Anemia    Past Surgical History  Procedure Laterality Date  . US echocardiography  04/09/2010    EF 60-65%  . Tonsillectomy  1940's  . Appendectomy  1950's  . Cholecystectomy  ~ 1965  . Esophagogastroduodenoscopy N/A 04/11/2013    Procedure: ESOPHAGOGASTRODUODENOSCOPY (EGD);  Surgeon: Missy Sabins, MD;  Location: [facility] ENDOSCOPY;  Service: Endoscopy;  Laterality: N/A;  . Esophagogastroduodenoscopy N/A 05/13/2014    Procedure: ESOPHAGOGASTRODUODENOSCOPY (EGD);  Surgeon: Arta Silence, MD;  Location: [facility] ENDOSCOPY;  Service: Endoscopy;  Laterality: N/A;  . Cardiac catheterization  06/26/2008    EF 30-35%  . Cardiac catheterization N/A 08/20/2014    Procedure: Right/Left Heart Cath and Coronary Angiography;  Surgeon: Jolaine Artist, MD;  Location: [facility] CV LAB;  Service: Cardiovascular;  Laterality: N/A;  . Cardiac catheterization N/A 08/21/2014    Procedure: Coronary Stent Intervention;  Surgeon: Wellington Hampshire, MD;  Location: [facility] LAB;  Service: Cardiovascular;  Laterality: N/A;    Allergies  No Known Allergies  HPI He was seen by [name], NP in April, 2016 - note is as below  75 year old male with the above complex problem list. I last saw him  in clinic about 2 weeks ago, at which time he reported a 4 week history of dyspnea and lower extremity edema, which was initially treated as an upper respiratory infection. He did have evidence of volume overload on exam and we increased his Lasix. He was seen 3 days later secondary to ongoing dyspnea despite diuresis. D-dimer was drawn and was elevated at 4. He was referred to the hospital for CT angiography. This was performed early on April 16 and revealed bilateral submassive pulmonary emboli. He was admitted to medicine and placed on heparin. Radical care in interventional radiology were consulted and did not feel that he met criteria for TPA. He was  hemodynamically stable throughout his hospitalization and converted to Coumadin. Prior to this, he was seen by GI given prior history of GI bleeding and an EGD was performed on April 18 and showed gastric erosions without evidence of active bleeding. PPI therapy was prescribed.  Since his discharge, he has continued to have dyspnea with minimal exertion. He has been on Lovenox bridging and finish his last Lovenox injection yesterday morning. He has not yet had her INR checked. He is not having chest pain, palpitations, PND, orthopnea, dizziness, syncope, edema, or early satiety. Previous symptoms of volume overload including increasing abdominal girth and edema have more or less resolved.   August 01, 2014:    Luisdavid returns for evaluation of his chronic shortness of breath and leg edema. The cardiopulmonary stress test revealed mild functional impairment suggestive of a circulatory issue. He returns today to discuss right left heart catheterization.  Still has shortness of breath. Unable to do anything without gasping for air - doing ADLs cause significant dyspnea . Has had progressive DOE over the past month or so    Sept. 19, 2016:  Karry had PCI of the mid - distal LAD a month ago. mid to distal LAD.  2.75 x 18 mm resolute integrity drug-eluting stent which was deployed to 14 atm. This was postdilated with a 3.0 x 15 mm noncompliant balloon. His right-sided pressures were completely normal.  Complains of some mild swelling of his right ankle. Thinks that he is able to do more exercise and recover quicker.  Breathing is ok   Nov. 8, 2016: No CP , Still short of breath , seems to be getting a little worse, similar to prior to his stenting .  Getting some exercise  April 30, 2015:  Has had a month of more fatigue, shortness of breath .  Was seen by Kerin Ransom,   Was found to to be anemic/ Was admitted to the hospital on March 22. He received one unit of blood and also a transfusion of  iron.   Stool quiac was negative .  He seen his general medical doctor but has not seen a gastroenterologist.   Is scheduled to see a hemotologist / oncologist.   His home health nurse increased his Lasix to 20 mg a day. His wife thought that he was becoming very volume depleted. He has a severe lack of energy at this point. Had blood work at his primary  MD office  Melinda Crutch, MD )    Home Medications  Current Outpatient Prescriptions  Medication Sig Dispense Refill  . atorvastatin (LIPITOR) 80 MG tablet Take 1 tablet (80 mg total) by mouth daily. 90 tablet 3  . carvedilol (COREG) 6.25 MG tablet Take 1 tablet (6.25 mg total) by mouth 2 (two) times daily with a meal. 180 tablet 3  .  Cholecalciferol (VITAMIN D3 PO) Take 1,000 capsules by mouth 2 (two) times a week.     . ezetimibe (ZETIA) 10 MG tablet Take 1 tablet (10 mg total) by mouth daily. 30 tablet 11  . fenofibrate (TRICOR) 48 MG tablet Take 1 tablet (48 mg total) by mouth daily. 30 tablet 11  . ferrous sulfate 325 (65 FE) MG tablet Take 1 tablet (325 mg total) by mouth daily with breakfast. 30 tablet 0  . folic acid (FOLVITE) 1 MG tablet Take 1 tablet (1 mg total) by mouth daily. 30 tablet 0  . furosemide (LASIX) 20 MG tablet Take 1 tablet (20 mg total) by mouth daily. 90 tablet 3  . gabapentin (NEURONTIN) 600 MG tablet Take 600 mg by mouth daily.    . hydrocortisone (CORTEF) 20 MG tablet Take 20 mg by mouth 2 (two) times daily.     . isosorbide mononitrate (IMDUR) 30 MG 24 hr tablet Take 0.5 tablets (15 mg total) by mouth daily.    Marland Kitchen lisinopril (PRINIVIL,ZESTRIL) 10 MG tablet Take 5 mg by mouth daily.    . metoCLOPramide (REGLAN) 5 MG tablet Take 5 mg by mouth daily.     . nitroGLYCERIN (NITROSTAT) 0.4 MG SL tablet Place 1 tablet (0.4 mg total) under the tongue every 5 (five) minutes as needed for chest pain (up to 3 doses). 25 tablet 3  . pantoprazole (PROTONIX) 40 MG tablet Take 1 tablet (40 mg total) by mouth daily. 30 tablet 0    . potassium chloride (K-DUR,KLOR-CON) 10 MEQ tablet Take 5 mEq by mouth daily.    Marland Kitchen PROAIR HFA 108 (90 BASE) MCG/ACT inhaler INHALE 2 PUFFS INTO THE LUNGS EVERY 6 (SIX) HOURS AS NEEDED FOR WHEEZING OR SHORTNESS OF BREATH. 8.5 Inhaler 2  . senna-docusate (SENOKOT-S) 8.6-50 MG tablet Take 1 tablet by mouth at bedtime. 30 tablet 0  . thyroid (ARMOUR) 60 MG tablet Take 60 mg by mouth daily.     . traMADol (ULTRAM) 50 MG tablet Take 100 mg by mouth 3 (three) times daily.     . traZODone (DESYREL) 100 MG tablet Take 100 mg by mouth every evening.   2  . warfarin (COUMADIN) 3 MG tablet Please take 34m on 3/24, then go back to home dose 344mevery day except Monday and Friday . On Monday and Friday take 1.16m69mHave INR checked on Monday 3/27. 30 tablet 3   No current facility-administered medications for this visit.                                                                                                                       Review of Systems  As above, he continues to experience DOE.  He denies chest pain, palpitations, pnd, orthopnea, n, v, dizziness, syncope, edema, weight gain, or early satiety. All other systems reviewed and are otherwise negative except as noted above.  Physical Exam  VS:  BP 120/80 mmHg  Pulse 88  Ht _0  (1.778 m)  Wt 223 lb (101.152 kg)  BMI 32.00 kg/m2  SpO2 97% , BMI Body mass index is 32 kg/(m^2). GEN: Well nourished, well developed, in no acute distress. HEENT: normal. Neck: Supple, no JVD, carotid bruits, or masses. Cardiac: RRR, no murmurs, rubs, or gallops. No clubbing, cyanosis.  Trace bilat LE edema.  Radials/DP/PT 2+ and equal bilaterally.  His posterior tibialis pulses is 2+ bilateral  Respiratory:  Respirations regular and unlabored, clear to auscultation bilaterally. GI: Soft, nontender, nondistended, BS + x 4. MS: no deformity or atrophy. Skin: warm and dry, no rash. Neuro:  Strength and sensation are intact. Psych: Normal  affect.   Assessment & Plan  1.  Bilateral pulmonary emboli/right lower extremity DVT:    On warfarin,    2. Chronic diastolic congestive heart failure: Volume appears stable today. Heart rate and blood pressure stable. Continue Lasix at current dose. spirinolactone was stopped by his nephrologist.  He did not notice any worsening of his dyspnea.   3. Hypertension: Stable on current regimen including blocker, ACE inhibitor, and calcium channel blocker therapy.     4. CAD - s/p PCI .   No angina .  He continues to have significant DOE.  No real angina   5.  Peripheral neuropathy:  He has excellent distal pulses in his feet. The burning that he is having in his feet and toes are  not due to peripheral arterial disease. 6. Anemia:   No evidence of GI source.   Has an appt with Hematology soon  Thayer Headings, MD  04/30/2015 10:40 AM    [address],  [address] [place], [place]  [id number] Pager [phone] Phone: [phone]; Fax: [phone]

## 2015-05-14 ENCOUNTER — Other Ambulatory Visit: Payer: Self-pay | Admitting: *Deleted

## 2015-05-14 MED ORDER — LISINOPRIL 10 MG PO TABS: 5.0000 mg | ORAL_TABLET | Freq: Every day | ORAL | Status: DC

## 2015-05-14 NOTE — Telephone Encounter (Signed)
Verbal permission given to speak with [name] regarding medication.

## 2015-05-15 ENCOUNTER — Other Ambulatory Visit: Payer: Self-pay | Admitting: Emergency Medicine

## 2015-06-02 ENCOUNTER — Other Ambulatory Visit: Payer: BLUE CROSS/BLUE SHIELD

## 2015-06-03 ENCOUNTER — Telehealth: Payer: Self-pay | Admitting: Cardiovascular Disease

## 2015-06-03 NOTE — Telephone Encounter (Signed)
I spoke with pt's wife Darlene. I do not see [facility] DPR on file, pt not available to give me verbal permission to speak with patient. Darlene states she will ask pt to call back when he is available.

## 2015-06-03 NOTE — Telephone Encounter (Signed)
Agree with office visit with flex PA tomorrow

## 2015-06-03 NOTE — Telephone Encounter (Signed)
[name] wife states pt does note increase in lower extremity edema as well as swelling in his hands and increase in shortness of breath.  [name]  wife denies any changes in pt's medication or diet recently.  Pt 's wife advised I will forward to Dr Acie Fredrickson for review

## 2015-06-03 NOTE — Telephone Encounter (Signed)
Pt gave me verbal permission to speak with his wife Darlene.  [name] wife states weight end of April 2017 -  219 lbs [name] weight yesterday was 224 lbs--pt's wife states she gave pt lasix 40mg  daily instead of lasix 20mg  daily( usual daily dose)and Klor 10 mEq instead of 1/2 of a 10 mEq (usual daily dose) since weight seemed to be going up.  [name] weight today is 226 lbs.  [name] wife concerned that pt's weight has increased 7 pounds in 2-3 weeks and that she gave him extra lasix yesterday and his weight went up 2 pounds from yesterday

## 2015-06-03 NOTE — Telephone Encounter (Signed)
Reviewed with Richardson Dopp, PA--  Per Scott--take lasix 40 mg now in addition to 20 mg lasix taken this morning, take KLor 1/2 of 10 mEq tablet now in addition to 1/2 of 10 mEq KLor taken this morning, arrange Flex appt tomorrow.

## 2015-06-03 NOTE — Telephone Encounter (Signed)
[name] wife aware of recommendations to take extra lasix and KLor today, appt scheduled tomorrow with Nell Range, Flex PA at 2 PM.

## 2015-06-03 NOTE — Telephone Encounter (Signed)
New Message:  [name] wife is calling in to report the pt's weight. She was instructed to keep a watch this for the the pt's fluid retention. Please f/u with her.

## 2015-06-04 ENCOUNTER — Other Ambulatory Visit: Payer: BLUE CROSS/BLUE SHIELD | Admitting: *Deleted

## 2015-06-04 ENCOUNTER — Ambulatory Visit (INDEPENDENT_AMBULATORY_CARE_PROVIDER_SITE_OTHER): Payer: BLUE CROSS/BLUE SHIELD | Admitting: Physician Assistant

## 2015-06-04 ENCOUNTER — Encounter: Payer: Self-pay | Admitting: Physician Assistant

## 2015-06-04 VITALS — BP 140/70 | HR 88 | Ht 70.0 in | Wt 233.2 lb

## 2015-06-04 DIAGNOSIS — N183 Chronic kidney disease, stage 3 unspecified: Secondary | ICD-10-CM

## 2015-06-04 DIAGNOSIS — E785 Hyperlipidemia, unspecified: Secondary | ICD-10-CM

## 2015-06-04 DIAGNOSIS — Z86711 Personal history of pulmonary embolism: Secondary | ICD-10-CM

## 2015-06-04 DIAGNOSIS — I10 Essential (primary) hypertension: Secondary | ICD-10-CM

## 2015-06-04 DIAGNOSIS — I5033 Acute on chronic diastolic (congestive) heart failure: Secondary | ICD-10-CM

## 2015-06-04 DIAGNOSIS — I2510 Atherosclerotic heart disease of native coronary artery without angina pectoris: Secondary | ICD-10-CM | POA: Diagnosis not present

## 2015-06-04 DIAGNOSIS — D638 Anemia in other chronic diseases classified elsewhere: Secondary | ICD-10-CM

## 2015-06-04 DIAGNOSIS — Z9861 Coronary angioplasty status: Secondary | ICD-10-CM

## 2015-06-04 LAB — HEPATIC FUNCTION PANEL
ALT: 18 U/L (ref 9–46)
AST: 15 U/L (ref 10–35)
Albumin: 4.1 g/dL (ref 3.6–5.1)
Alkaline Phosphatase: 52 U/L (ref 40–115)
TOTAL PROTEIN: 6.7 g/dL (ref 6.1–8.1)
Total Bilirubin: 0.2 mg/dL (ref 0.2–1.2)

## 2015-06-04 LAB — LDL CHOLESTEROL, DIRECT: LDL DIRECT: 58 mg/dL (ref <130)

## 2015-06-04 MED ORDER — POTASSIUM CHLORIDE CRYS ER 20 MEQ PO TBCR: 20.0000 meq | EXTENDED_RELEASE_TABLET | Freq: Two times a day (BID) | ORAL | Status: DC

## 2015-06-04 MED ORDER — FUROSEMIDE 40 MG PO TABS: 40.0000 mg | ORAL_TABLET | Freq: Every day | ORAL | Status: DC

## 2015-06-04 NOTE — Addendum Note (Signed)
Addended by: Eulis Foster on: 06/04/2015 02:35 PM   Modules accepted: Orders

## 2015-06-04 NOTE — Patient Instructions (Addendum)
Medication Instructions:  Your physician has recommended you make the following change in your medication:  1.  INCREASE the Lasix to 40 mg taking 1 tablet twice a day 2.  INCREASE the Potassium to 20 meq taking 1 tablet twice a day  Labwork: TODAY;  BNP & BMP (ALONG WITH THE LIPID)  Testing/Procedures: None ordered  Follow-Up: Your physician recommends that you schedule a follow-up appointment in: 06/09/15 WITH Truitt Merle, NP (FLEX IS FINE)   Any Other Special Instructions Will Be Listed Below (If Applicable).   If you need a refill on your cardiac medications before your next appointment, please call your pharmacy.

## 2015-06-04 NOTE — Progress Notes (Signed)
Cardiology Office Note    Date:  06/04/2015   ID:  [name], DOB [birth year], MRN [id number]  PCP:   Melinda Crutch, MD  Cardiologist:  [name]   LE edema, weight gain and SOB.   History of Present Illness:  [name] is a 75 y.o. 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    In 04/2014 he was diagnosed with bilateral submassive pulmonary emboli. He was seen by GI given prior history of GI bleeding and an EGD was performed which showed gastric erosions without evidence of active bleeding. PPI therapy was prescribed. He was started on coumadin and bridged with Lovenox.   He continued to have severe DOE. A cardiopulmonary stress test revealed mild functional impairment suggestive of a circulatory issue. He was set up for left and right heart catheterization which revealed significant 2V CAD. He underwent PCI/DES to dLAD and rPDA in 07/2014. He was put on ASA, plavix and coumadin for 1 month followed by only plavix and coumadin.   In 03/2015 he was seen in the office for fatige and SOB. He was found to be anemia and admitted for transfusion. He received one unit of blood and also a transfusion of iron.Stool quiac was negative  His plavix was discontinued.  He was scheduled to see a hemotologist / oncologist since there was no evidence of GI source. 2D ECHO showed EF 60% - 65%, G1DD and dilated aortic room at 44 mm.    He was seen by [name] for post hospital follow up in 04/30/2015. He felt his volume status was stable. Weight was 223lbs.  Today he presents to clinic for evaluation. Over the weekend he had a sinus infection and cough. He has continued to notice weight gain and fluid retention. He has some SOB. He chronically sleeps sitting up due to neck pains. No PND. He doesn't know if he  has had any abdominal distension. He has swelling up to his knees. No dizziness or syncope. No blood in his stool or urine. Today he weight 233 on our scale.     Past Medical History  Diagnosis Date  . Chronic diastolic CHF (congestive heart failure) ([place])     a. His initial ejection fraction was between 30 and 35%;  b. 04/2013 Echo: EF 50-55% Gr1 DD, mild-mod MR;  c. 04/2014 Echo: EF 60-65%, Gr 1 DD, triv TR.  Marland Kitchen Thyroid disease   . Pulmonary HTN ([place])   . Hypertension   . Addison's disease ([place])   . Hypogonadism male   . CKD (chronic kidney disease), stage III   . History of aortic insufficiency   . Hx of cardiovascular stress test     a. ETT-Myoview 7/14:  Low risk, inferior defect consistent with thinning, no ischemia, normal wall motion, EF 54%  . DVT (deep venous thrombosis) ([place])     a. 02/2013 superficial thrombophlebitis --> Xarelto later d/c'd 2/2 GIB;  b. 04/2014 Acute Right PT, Peroneal, Popliteal, Femoral, and common femoral DVT-->coumadin  . GERD (gastroesophageal reflux disease)   . H/O hiatal hernia   . Hepatitis     a. 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  . Daily headache   . Chronic back pain   . Peptic ulcer disease   . On home oxygen therapy  a. added 04/2014 in setting of PE.  Marland Kitchen Acute lower GI bleeding     a. admitted to [facility] 04/11/2013;  b. 04/2014 EGD: 1.  gastric erosions, mild prox gastritis and bulbar duodenitis->Protonix.  . Pulmonary embolism ([place])     a. 04/2014 CTA: Bilat submassive PE ->coumadin.  . Coronary artery disease     a. 07/2014 - s/p difficult PCI - had pressure wire analysis of LAD s/p DES in mid segment c/b wire-induced dissection in the distal segment of the LAD which was treated with repeated balloon inflations. Also had DES to rPDA - again had either spasm distal to the stent placement or an edge dissection but the vessel was small in that area; procedure aborted.  . Obesity   . Anemia     Past Surgical History  Procedure Laterality Date  . US echocardiography   04/09/2010    EF 60-65%  . Tonsillectomy  1940's  . Appendectomy  1950's  . Cholecystectomy  ~ 1965  . Esophagogastroduodenoscopy N/A 04/11/2013    Procedure: ESOPHAGOGASTRODUODENOSCOPY (EGD);  Surgeon: Missy Sabins, MD;  Location: [facility] ENDOSCOPY;  Service: Endoscopy;  Laterality: N/A;  . Esophagogastroduodenoscopy N/A 05/13/2014    Procedure: ESOPHAGOGASTRODUODENOSCOPY (EGD);  Surgeon: Arta Silence, MD;  Location: [facility] ENDOSCOPY;  Service: Endoscopy;  Laterality: N/A;  . Cardiac catheterization  06/26/2008    EF 30-35%  . Cardiac catheterization N/A 08/20/2014    Procedure: Right/Left Heart Cath and Coronary Angiography;  Surgeon: Jolaine Artist, MD;  Location: [facility] CV LAB;  Service: Cardiovascular;  Laterality: N/A;  . Cardiac catheterization N/A 08/21/2014    Procedure: Coronary Stent Intervention;  Surgeon: Wellington Hampshire, MD;  Location: [place] [facility] LAB;  Service: Cardiovascular;  Laterality: N/A;    Current Medications: Outpatient Prescriptions Prior to Visit  Medication Sig Dispense Refill  . atorvastatin (LIPITOR) 80 MG tablet Take 1 tablet (80 mg total) by mouth daily. 90 tablet 3  . carvedilol (COREG) 6.25 MG tablet Take 1 tablet (6.25 mg total) by mouth 2 (two) times daily with a meal. 180 tablet 3  . Cholecalciferol (VITAMIN D3 PO) Take 1,000 capsules by mouth 2 (two) times a week.     . ezetimibe (ZETIA) 10 MG tablet Take 1 tablet (10 mg total) by mouth daily. 30 tablet 11  . fenofibrate (TRICOR) 48 MG tablet Take 1 tablet (48 mg total) by mouth daily. 30 tablet 11  . ferrous sulfate 325 (65 FE) MG tablet Take 1 tablet (325 mg total) by mouth daily with breakfast. 30 tablet 0  . folic acid (FOLVITE) 1 MG tablet Take 1 tablet (1 mg total) by mouth daily. 30 tablet 0  . gabapentin (NEURONTIN) 600 MG tablet Take 600 mg by mouth daily.    . hydrocortisone (CORTEF) 20 MG tablet Take 20 mg by mouth 3 (three) times daily.     . isosorbide mononitrate (IMDUR) 30 MG 24 hr  tablet Take 0.5 tablets (15 mg total) by mouth daily.    Marland Kitchen lisinopril (PRINIVIL,ZESTRIL) 10 MG tablet Take 0.5 tablets (5 mg total) by mouth daily. 7 tablet 0  . metoCLOPramide (REGLAN) 5 MG tablet Take 5 mg by mouth daily.     . nitroGLYCERIN (NITROSTAT) 0.4 MG SL tablet Place 1 tablet (0.4 mg total) under the tongue every 5 (five) minutes as needed for chest pain (up to 3 doses). 25 tablet 3  . pantoprazole (PROTONIX) 40 MG tablet Take 1 tablet (40 mg total) by mouth daily. [place]  tablet 0  . PROAIR HFA 108 (90 BASE) MCG/ACT inhaler INHALE 2 PUFFS INTO THE LUNGS EVERY 6 (SIX) HOURS AS NEEDED FOR WHEEZING OR SHORTNESS OF BREATH. 8.5 Inhaler 2  . thyroid (ARMOUR) 60 MG tablet Take 60 mg by mouth daily.     . traMADol (ULTRAM) 50 MG tablet Take 100 mg by mouth 3 (three) times daily.     . traZODone (DESYREL) 100 MG tablet Take 100 mg by mouth every evening.   2  . warfarin (COUMADIN) 3 MG tablet Please take 6mg  on 3/24, then go back to home dose 3mg  every day except Monday and Friday . On Monday and Friday take 1.5mg . Have INR checked on Monday 3/27. 30 tablet 3  . warfarin (COUMADIN) 3 MG tablet TAKE AS DIRECTED BY COUMADIN CLINIC 30 tablet 3  . furosemide (LASIX) 20 MG tablet Take 3 times per week 90 tablet 3  . potassium chloride (K-DUR,KLOR-CON) 10 MEQ tablet Take 5 mEq by mouth 3 (three) times a week.    . senna-docusate (SENOKOT-S) 8.6-50 MG tablet Take 1 tablet by mouth at bedtime. (Patient not taking: Reported on 06/04/2015) 30 tablet 0   No facility-administered medications prior to visit.     Allergies:   Other   Social History   Social History  . Marital Status: Married    Spouse Name: N/A  . Number of Children: N/A  . Years of Education: N/A   Occupational History  . retired    Social History Main Topics  . Smoking status: Never Smoker   . Smokeless tobacco: Never Used  . Alcohol Use: No  . Drug Use: No  . Sexual Activity: Not Currently   Other Topics Concern  . None    Social History Narrative     Family History:  The patient's family history includes Clotting disorder in his brother, brother, brother, mother, and other. There is no history of Heart attack.   ROS:   Please see the history of present illness.    ROS All other systems reviewed and are negative.   PHYSICAL EXAM:   VS:  BP 140/70 mmHg  Pulse 88  Ht 5\' 10"  (1.778 m)  Wt 233 lb 3.2 oz (105.779 kg)  BMI 33.46 kg/m2   GEN: Well nourished, well developed, in no acute distress HEENT: normal Neck: no JVD, carotid bruits, or masses Cardiac: RRR; no murmurs, rubs, or gallops, 2+ bilateral LE edema to knees. + JVD Respiratory:  Mild crackles at bases, normal work of breathing GI: soft, nontender, nondistended, + BS MS: no deformity or atrophy Skin: warm and dry, no rash Neuro:  Alert and Oriented x 3, Strength and sensation are intact Psych: euthymic mood, full affect  Wt Readings from Last 3 Encounters:  06/04/15 233 lb 3.2 oz (105.779 kg)  04/30/15 223 lb (101.152 kg)  04/18/15 217 lb 8 oz (98.657 kg)      Studies/Labs Reviewed:   EKG:  EKG is ordered today.    Recent Labs: 04/15/2015: TSH 1.24 04/16/2015: ALT 16* 04/18/2015: BUN 24*; Creatinine, Ser 1.99*; Hemoglobin 8.5*; Platelets 421*; Potassium 3.8; Sodium 144   Lipid Panel    Component Value Date/Time   CHOL 177 02/27/2015 1256   TRIG 223* 02/27/2015 1256   HDL 41 02/27/2015 1256   CHOLHDL 4.3 02/27/2015 1256   VLDL 45* 02/27/2015 1256   LDLCALC 91 02/27/2015 1256   LDLDIRECT 96 02/27/2015 1256    Additional studies/ records that were reviewed today include:  2D ECHO: 04/17/2015 LV EF: 60% - 65% Study Conclusions - Left ventricle: The cavity size was normal. Wall thickness was  increased in a pattern of moderate LVH. Systolic function was  normal. The estimated ejection fraction was in the range of 60%  to 65%. Wall motion was normal; there were no regional wall  motion abnormalities. Doppler  parameters are consistent with  abnormal left ventricular relaxation (grade 1 diastolic  dysfunction). The E/e&' ratio is between 8-15, suggesting  indeterminate LV filling pressure. - Aortic valve: Trileaflet. Sclerosis without stenosis. There was  trivial regurgitation. - Aorta: Aortic root dimension: 44 mm (ED). - Aortic root: The aortic root is dilated. - Ascending aorta: The ascending aorta was normal in size. - Mitral valve: Calcified annulus. There was trivial regurgitation. - Left atrium: Moderately dilated at 40 ml/m2. - Tricuspid valve: There was no significant regurgitation. - Inferior vena cava: The vessel was normal in size. The  respirophasic diameter changes were in the normal range (= 50%),  consistent with normal central venous pressure. Impressions: - Compared to a prior study in 2016, there are few changes. The  aortic root measures 4.4 cm. The ascending aorta measures up to 4  cm at the most distal visualized point.   08/20/14 L/RHC Procedures    Right/Left Heart Cath and Coronary Angiography    Conclusion     Dist LAD lesion, 80% stenosed.  Mid RCA lesion, 20% stenosed.  RPDA lesion, 95% stenosed.  RA = 6 RV = 28/4/10 PA = 29/15 (21) PCWP = 15 Ao = 148/77 (107) LV = 138/5/18 Fick CO/CI = 7.8/3.6 Thermo CO/CI = 6.5/3.0 PVR < 1 WU Ao sat 93% PA sat = 71%, 74% High SVC sat = 68% RA sat = 69%   Assessment: 1) 2V CAD  2) Normal pulmonary pressures with elevated cardiac output but no evidence of intracardiac shunt 3) Normal EF by echo Plan/Discussion: Despite his obesity and recent PE, his PA pressures are surprisingly normal. Suspect some of his dyspnea related to his body habitus but also has significant 2V CAD and will need PCI. With PE 3 months ago will discuss timing of intervention and possibility of triple-drug therapy with [name] and the interventional team.    08/21/14 Procedures    Coronary Stent Intervention      Conclusion    1. Successful pressure wire interrogation of the LAD with subsequent placement of a drug-eluting stent in the midsegment for an initial 70% stenosis with 0% residual stenosis. This was complicated by a wire-induced dissection in the distal segment of the LAD which was treated with repeated balloon inflations.  2. Successful drug-eluting stent placement to the right PDA. Initial stenosis was 80% which was reduced to 0%. Again, the patient was noted to have either spasm distal to the stent placement or an edge dissection but the vessel was small in that area and given the lack of symptoms and EKG changes I decided to abort the procedure.  Recommendations: This was overall a difficult procedure due to the mentioned issues. Continue hydration to prevent contrast-induced nephropathy. In terms of anticoagulation, warfarin can be resumed tonight. Heparin can be resumed in 8 hours. For now continue with aspirin and Plavix. Once INR is therapeutic, aspirin can be discontinued. The patient can continue on warfarin and Plavix.       ASSESSMENT:    1. Acute on chronic diastolic CHF (congestive heart failure), NYHA class 2 ([place])   2. CAD S/P percutaneous coronary angioplasty  3. Essential hypertension   4. Hyperlipidemia   5. CKD (chronic kidney disease) stage 3, GFR 30-59 ml/min   6. Anemia of chronic disease   7. History of pulmonary embolism      PLAN:  In order of problems listed above:  Acute on chronic diastolic CHF:  He has been on lasix 20mg  daily. He has not had very good UOP on this. His weight is up 10 lbs since last visit 223--> 233 lbs and he has s/s CHF. Will increase lasix to 40mg  BID and Kdur 58mEq BID with close follow up on Monday. Will check BNP and BMP today. He has CKD and we will have to watch his kidney function with diuresis.   CAD s/p DES to dLAD and rPDA (02/2014): continue plavix. No ASA due to coumadin. Continue BB and statin.   HTN: BP well controlled  on current regimen.   HLD: continue statin, Zetia and fenofibrate   CKD: creat baseline ~2. Check BMET today   Anemia of chronic disease: recently admitted for transfusion of blood and iron.   Hx of DVT/bilateral PE: continue coumadin  Medication Adjustments/Labs and Tests Ordered: Current medicines are reviewed at length with the patient today.  Concerns regarding medicines are outlined above.  Medication changes, Labs and Tests ordered today are listed in the Patient Instructions below. Patient Instructions  Medication Instructions:  Your physician has recommended you make the following change in your medication:  1.  INCREASE the Lasix to 40 mg taking 1 tablet twice a day 2.  INCREASE the Potassium to 20 meq taking 1 tablet twice a day  Labwork: TODAY;  BNP & BMP (ALONG WITH THE LIPID)  Testing/Procedures: None ordered  Follow-Up: Your physician recommends that you schedule a follow-up appointment in: 06/09/15 WITH Truitt Merle, NP (FLEX IS FINE)   Any Other Special Instructions Will Be Listed Below (If Applicable).   If you need a refill on your cardiac medications before your next appointment, please call your pharmacy.       Judy Pimple, PA-C  06/04/2015 2:50 PM    [facility] [address], [place], [place]  [address] Phone: [phone]; Fax: [phone]

## 2015-06-09 ENCOUNTER — Encounter: Payer: Self-pay | Admitting: Nurse Practitioner

## 2015-06-09 ENCOUNTER — Ambulatory Visit: Payer: BLUE CROSS/BLUE SHIELD | Admitting: Emergency Medicine

## 2015-06-09 ENCOUNTER — Telehealth: Payer: Self-pay | Admitting: Nurse Practitioner

## 2015-06-09 ENCOUNTER — Ambulatory Visit (INDEPENDENT_AMBULATORY_CARE_PROVIDER_SITE_OTHER): Payer: BLUE CROSS/BLUE SHIELD | Admitting: Nurse Practitioner

## 2015-06-09 ENCOUNTER — Ambulatory Visit (INDEPENDENT_AMBULATORY_CARE_PROVIDER_SITE_OTHER): Payer: BLUE CROSS/BLUE SHIELD | Admitting: *Deleted

## 2015-06-09 VITALS — BP 170/110 | HR 84 | Ht 70.0 in | Wt 231.8 lb

## 2015-06-09 DIAGNOSIS — I5033 Acute on chronic diastolic (congestive) heart failure: Secondary | ICD-10-CM

## 2015-06-09 DIAGNOSIS — I2782 Chronic pulmonary embolism: Secondary | ICD-10-CM | POA: Diagnosis not present

## 2015-06-09 DIAGNOSIS — Z5181 Encounter for therapeutic drug level monitoring: Secondary | ICD-10-CM | POA: Diagnosis not present

## 2015-06-09 DIAGNOSIS — I82409 Acute embolism and thrombosis of unspecified deep veins of unspecified lower extremity: Secondary | ICD-10-CM

## 2015-06-09 LAB — BASIC METABOLIC PANEL
BUN: 25 mg/dL (ref 7–25)
CO2: 26 mmol/L (ref 20–31)
Calcium: 9.4 mg/dL (ref 8.6–10.3)
Chloride: 104 mmol/L (ref 98–110)
Creat: 2.03 mg/dL — ABNORMAL HIGH (ref 0.70–1.18)
Glucose, Bld: 102 mg/dL — ABNORMAL HIGH (ref 65–99)
Potassium: 3.8 mmol/L (ref 3.5–5.3)
Sodium: 144 mmol/L (ref 135–146)

## 2015-06-09 LAB — CBC
HCT: 37.2 % — ABNORMAL LOW (ref 38.5–50.0)
Hemoglobin: 11.5 g/dL — ABNORMAL LOW (ref 13.2–17.1)
MCH: 26.3 pg — ABNORMAL LOW (ref 27.0–33.0)
MCHC: 30.9 g/dL — ABNORMAL LOW (ref 32.0–36.0)
MCV: 85.1 fL (ref 80.0–100.0)
MPV: 9.8 fL (ref 7.5–12.5)
Platelets: 346 10*3/uL (ref 140–400)
RBC: 4.37 MIL/uL (ref 4.20–5.80)
RDW: 23.6 % — ABNORMAL HIGH (ref 11.0–15.0)
WBC: 14.2 10*3/uL — ABNORMAL HIGH (ref 3.8–10.8)

## 2015-06-09 LAB — BRAIN NATRIURETIC PEPTIDE: Brain Natriuretic Peptide: 23.5 pg/mL (ref <100)

## 2015-06-09 LAB — POCT INR: INR: 2.6

## 2015-06-09 NOTE — Addendum Note (Signed)
Addended by: Lamar Laundry on: 06/09/2015 09:06 AM   Modules accepted: Orders

## 2015-06-09 NOTE — Telephone Encounter (Signed)
New message      Wife was not able to come to appt with husband.  Please call and give an update on today's appt

## 2015-06-09 NOTE — Patient Instructions (Addendum)
SHOW THIS PAPER TO YOUR WIFE FOR ME  We will be checking the following labs today - BNP, BMET and CBC   Medication Instructions:    Continue with your current medicines.   Needs to stay on the increased dose of Furosemide and Potassium    Testing/Procedures To Be Arranged:  N/A  Follow-Up:   See PA/NP in about 2 weeks  See [name] in October as planned.   See Dr. Beryle Beams tomorrow as planned.     Other Special Instructions:   Restrict your salt.  You have to start check your blood pressure - twice a day for the next 2 weeks and keep a diary - bring to your next appointment.     If you need a refill on your cardiac medications before your next appointment, please call your pharmacy.   Call the [place] office at [phone] if you have any questions, problems or concerns.

## 2015-06-09 NOTE — Telephone Encounter (Signed)
S/w wife went over AVS with pt's wife.

## 2015-06-09 NOTE — Progress Notes (Addendum)
CARDIOLOGY OFFICE NOTE  Date:  06/09/2015    [name] Date of Birth: 10-22-1940 Medical Record [id number]  PCP:   Melinda Crutch, MD  Cardiologist:  [name]    Chief Complaint  Patient presents with  . Congestive Heart Failure    5 day check - seen for [name]    History of Present Illness: [name] is a 75 y.o. male who presents today for a 5 day recheck. Seen for [name].   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    In 04/2014 he was diagnosed with bilateral submassive pulmonary emboli. He was seen by GI given prior history of GI bleeding and an EGD was performed which showed gastric erosions without evidence of active bleeding. PPI therapy was prescribed. He was started on coumadin and bridged with Lovenox.   He continued to have severe DOE. A cardiopulmonary stress test revealed mild functional impairment suggestive of a circulatory issue. He was set up for left and right heart catheterization which revealed significant 2V CAD. He underwent PCI/DES to dLAD and rPDA in 07/2014. He was put on ASA, plavix and coumadin for 1 month followed by only plavix and coumadin.   In 03/2015 he was seen in the office for fatige and SOB. He was found to be anemia and admitted for transfusion. He received one unit of blood and also a transfusion of iron.Stool quiac was negative His plavix was discontinued. He was scheduled to see a hemotologist / oncologist since there was no evidence of GI source. 2D ECHO showed EF 60% - 65%, G1DD and dilated aortic room at 44 mm.   He was seen by [name] for post hospital follow up in 04/30/2015. He felt his volume status was stable. Weight was 223lbs.  Seen 5 days ago by Bonney Leitz, PA. Complaining of weight gain and fluid retention. He had had a sinus infection. Some dyspnea. + edema on exam. Lasix was  increased and he was asked to come back for follow up.   Comes back today. Here alone today. He admits that he really can't do much or tell me much without her. Not clear how much of his medicines he has had today. Says he really did not know how to get here today.  Feels like he "does not get enough air". Tires easily. Swelling goes and comes. Says he does not get much salt. Has had swelling in his hands. Wife did write me a note - she thought his ankles and feet seemed less swollen. Weight really not down. No chest pain "he thinks". He admits he "can't do much without Mama".   Past Medical History  Diagnosis Date  . Chronic diastolic CHF (congestive heart failure) ([place])     a. His initial ejection fraction was between 30 and 35%;  b. 04/2013 Echo: EF 50-55% Gr1 DD, mild-mod MR;  c. 04/2014 Echo: EF 60-65%, Gr 1 DD, triv TR.  Marland Kitchen Thyroid disease   . Pulmonary HTN ([place])   . Hypertension   . Addison's disease ([place])   . Hypogonadism male   . CKD (chronic kidney disease), stage III   . History of aortic insufficiency   . Hx of cardiovascular stress test     a. ETT-Myoview 7/14:  Low risk, inferior defect consistent with thinning, no ischemia, normal wall motion, EF 54%  .  DVT (deep venous thrombosis) ([place])     a. 02/2013 superficial thrombophlebitis --> Xarelto later d/c'd 2/2 GIB;  b. 04/2014 Acute Right PT, Peroneal, Popliteal, Femoral, and common femoral DVT-->coumadin  . GERD (gastroesophageal reflux disease)   . H/O hiatal hernia   . Hepatitis     a. 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  . Daily headache   . Chronic back pain   . Peptic ulcer disease   . On home oxygen therapy     a. added 04/2014 in setting of PE.  Marland Kitchen Acute lower GI bleeding     a. admitted to [facility] 04/11/2013;  b. 04/2014 EGD: 1.  gastric erosions, mild prox gastritis and bulbar duodenitis->Protonix.  . Pulmonary embolism ([place])     a. 04/2014 CTA: Bilat submassive PE ->coumadin.  . Coronary artery disease     a. 07/2014 - s/p difficult PCI - had  pressure wire analysis of LAD s/p DES in mid segment c/b wire-induced dissection in the distal segment of the LAD which was treated with repeated balloon inflations. Also had DES to rPDA - again had either spasm distal to the stent placement or an edge dissection but the vessel was small in that area; procedure aborted.  . Obesity   . Anemia     Past Surgical History  Procedure Laterality Date  . US echocardiography  04/09/2010    EF 60-65%  . Tonsillectomy  1940's  . Appendectomy  1950's  . Cholecystectomy  ~ 1965  . Esophagogastroduodenoscopy N/A 04/11/2013    Procedure: ESOPHAGOGASTRODUODENOSCOPY (EGD);  Surgeon: Missy Sabins, MD;  Location: [facility] ENDOSCOPY;  Service: Endoscopy;  Laterality: N/A;  . Esophagogastroduodenoscopy N/A 05/13/2014    Procedure: ESOPHAGOGASTRODUODENOSCOPY (EGD);  Surgeon: Arta Silence, MD;  Location[facility] ENDOSCOPY;  Service: Endoscopy;  Laterality: N/A;  . Cardiac catheterization  06/26/2008    EF 30-35%  . Cardiac catheterization N/A 08/20/2014    Procedure: Right/Left Heart Cath and Coronary Angiography;  Surgeon: Jolaine Artist, MD;  Location: [place] CV LAB;  Service: Cardiovascular;  Laterality: N/A;  . Cardiac catheterization N/A 08/21/2014    Procedure: Coronary Stent Intervention;  Surgeon: Wellington Hampshire, MD;  Location: [place] CV LAB;  Service: Cardiovascular;  Laterality: N/A;     Medications: Current Outpatient Prescriptions  Medication Sig Dispense Refill  . atorvastatin (LIPITOR) 80 MG tablet Take 1 tablet (80 mg total) by mouth daily. 90 tablet 3  . carvedilol (COREG) 6.25 MG tablet Take 1 tablet (6.25 mg total) by mouth 2 (two) times daily with a meal. 180 tablet 3  . Cholecalciferol (VITAMIN D3 PO) Take 1,000 capsules by mouth 2 (two) times a week.     . ezetimibe (ZETIA) 10 MG tablet Take 1 tablet (10 mg total) by mouth daily. 30 tablet 11  . fenofibrate (TRICOR) 48 MG tablet Take 1 tablet (48 mg total) by mouth daily. 30 tablet 11    . ferrous sulfate 325 (65 FE) MG tablet Take 1 tablet (325 mg total) by mouth daily with breakfast. 30 tablet 0  . folic acid (FOLVITE) 1 MG tablet Take 1 tablet (1 mg total) by mouth daily. 30 tablet 0  . furosemide (LASIX) 40 MG tablet Take 1 tablet (40 mg total) by mouth daily. 90 tablet 3  . gabapentin (NEURONTIN) 600 MG tablet Take 600 mg by mouth daily.    . hydrocortisone (CORTEF) 20 MG tablet Take 20 mg by mouth 3 (three) times daily.     . isosorbide  mononitrate (IMDUR) 30 MG 24 hr tablet Take 0.5 tablets (15 mg total) by mouth daily.    Marland Kitchen lisinopril (PRINIVIL,ZESTRIL) 10 MG tablet Take 0.5 tablets (5 mg total) by mouth daily. 7 tablet 0  . metoCLOPramide (REGLAN) 5 MG tablet Take 5 mg by mouth daily.     . nitroGLYCERIN (NITROSTAT) 0.4 MG SL tablet Place 1 tablet (0.4 mg total) under the tongue every 5 (five) minutes as needed for chest pain (up to 3 doses). 25 tablet 3  . pantoprazole (PROTONIX) 40 MG tablet Take 1 tablet (40 mg total) by mouth daily. 30 tablet 0  . potassium chloride (K-DUR,KLOR-CON) 20 MEQ tablet Take 1 tablet (20 mEq total) by mouth 2 (two) times daily. 90 tablet 3  . PROAIR HFA 108 (90 BASE) MCG/ACT inhaler INHALE 2 PUFFS INTO THE LUNGS EVERY 6 (SIX) HOURS AS NEEDED FOR WHEEZING OR SHORTNESS OF BREATH. 8.5 Inhaler 2  . promethazine (PHENERGAN) 25 MG tablet Take 25 mg by mouth every 6 (six) hours as needed for nausea.     Marland Kitchen thyroid (ARMOUR) 60 MG tablet Take 60 mg by mouth daily.     . traMADol (ULTRAM) 50 MG tablet Take 100 mg by mouth 3 (three) times daily.     . traZODone (DESYREL) 100 MG tablet Take 100 mg by mouth every evening.   2  . warfarin (COUMADIN) 3 MG tablet Please take 6mg  on 3/24, then go back to home dose 3mg  every day except Monday and Friday . On Monday and Friday take 1.5mg . Have INR checked on Monday 3/27. 30 tablet 3  . warfarin (COUMADIN) 3 MG tablet TAKE AS DIRECTED BY COUMADIN CLINIC 30 tablet 3   No current facility-administered  medications for this visit.    Allergies: Allergies  Allergen Reactions  . Other Other (See Comments)    Other reaction(s): Unknown, NO KNOWN DRUG ALLERGY    Social History: The patient  reports that he has never smoked. He has never used smokeless tobacco. He reports that he does not drink alcohol or use illicit drugs.   Family History: The patient's family history includes Clotting disorder in his brother, brother, brother, mother, and other. There is no history of Heart attack.   Review of Systems: Please see the history of present illness.   Otherwise, the review of systems is positive for none.   All other systems are reviewed and negative.   Physical Exam: VS:  BP 170/110 mmHg  Pulse 84  Ht 5\' 10"  (1.778 m)  Wt 231 lb 12.8 oz (105.144 kg)  BMI 33.26 kg/m2 .  BMI Body mass index is 33.26 kg/(m^2).  Wt Readings from Last 3 Encounters:  06/09/15 231 lb 12.8 oz (105.144 kg)  06/04/15 233 lb 3.2 oz (105.779 kg)  04/30/15 223 lb (101.152 kg)   BP is 160/90 by me.   General: Pleasant. He is alert and in no acute distress.  Voice remains raspy.  HEENT: Normal. Neck: Supple, no JVD, carotid bruits, or masses noted.  Cardiac: Regular rate and rhythm. No murmurs, rubs, or gallops. Trace edema.  Respiratory:  Lungs are clear to auscultation bilaterally with normal work of breathing.  GI: Soft and nontender.  MS: No deformity or atrophy. Gait and ROM intact. Skin: Warm and dry. Color is normal.  Neuro:  Strength and sensation are intact and no gross focal deficits noted.  Psych: Alert, appropriate and with normal affect.   LABORATORY DATA:  EKG:  EKG is not ordered today.  Lab Results  Component Value Date   WBC 13.7* 04/18/2015   HGB 8.5* 04/18/2015   HCT 30.2* 04/18/2015   PLT 421* 04/18/2015   GLUCOSE 124* 04/18/2015   CHOL 177 02/27/2015   TRIG 223* 02/27/2015   HDL 41 02/27/2015   LDLDIRECT 58 06/04/2015   LDLCALC 91 02/27/2015   ALT 18 06/04/2015   AST 15  06/04/2015   NA 144 04/18/2015   K 3.8 04/18/2015   CL 111 04/18/2015   CREATININE 1.99* 04/18/2015   BUN 24* 04/18/2015   CO2 23 04/18/2015   TSH 1.24 04/15/2015   INR 1.9 04/30/2015   HGBA1C 6.2* 05/11/2014   Lab Results  Component Value Date   INR 1.9 04/30/2015   INR 1.33 04/18/2015   INR 1.44 05-16-2015     BNP (last 3 results) No results for input(s): BNP in the last 8760 hours.  ProBNP (last 3 results) No results for input(s): PROBNP in the last 8760 hours.   Other Studies Reviewed Today: 2D ECHO: 05-16-2015 LV EF: 60% - 65% Study Conclusions - Left ventricle: The cavity size was normal. Wall thickness was  increased in a pattern of moderate LVH. Systolic function was  normal. The estimated ejection fraction was in the range of 60%  to 65%. Wall motion was normal; there were no regional wall  motion abnormalities. Doppler parameters are consistent with  abnormal left ventricular relaxation (grade 1 diastolic  dysfunction). The E/e&' ratio is between 8-15, suggesting  indeterminate LV filling pressure. - Aortic valve: Trileaflet. Sclerosis without stenosis. There was  trivial regurgitation. - Aorta: Aortic root dimension: 44 mm (ED). - Aortic root: The aortic root is dilated. - Ascending aorta: The ascending aorta was normal in size. - Mitral valve: Calcified annulus. There was trivial regurgitation. - Left atrium: Moderately dilated at 40 ml/m2. - Tricuspid valve: There was no significant regurgitation. - Inferior vena cava: The vessel was normal in size. The  respirophasic diameter changes were in the normal range (= 50%),  consistent with normal central venous pressure. Impressions: - Compared to a prior study in 2016, there are few changes. The  aortic root measures 4.4 cm. The ascending aorta measures up to 4  cm at the most distal visualized point.   08/20/14 L/[facility] Procedures    Right/Left Heart Cath and Coronary Angiography     Conclusion     Dist LAD lesion, 80% stenosed.  Mid RCA lesion, 20% stenosed.  RPDA lesion, 95% stenosed.  RA = 6 RV = 28/4/10 PA = 29/15 (21) PCWP = 15 Ao = 148/77 (107) LV = 138/5/18 Fick CO/CI = 7.8/3.6 Thermo CO/CI = 6.5/3.0 PVR < 1 WU Ao sat 93% PA sat = 71%, 74% High SVC sat = 68% RA sat = 69%   Assessment: 1) 2V CAD  2) Normal pulmonary pressures with elevated cardiac output but no evidence of intracardiac shunt 3) Normal EF by echo Plan/Discussion: Despite his obesity and recent PE, his PA pressures are surprisingly normal. Suspect some of his dyspnea related to his body habitus but also has significant 2V CAD and will need PCI. With PE 3 months ago will discuss timing of intervention and possibility of triple-drug therapy with [name] and the interventional team.    08/21/14 Procedures    Coronary Stent Intervention    Conclusion    1. Successful pressure wire interrogation of the LAD with subsequent placement of a drug-eluting stent in the midsegment for an initial 70% stenosis with 0%  residual stenosis. This was complicated by a wire-induced dissection in the distal segment of the LAD which was treated with repeated balloon inflations.  2. Successful drug-eluting stent placement to the right PDA. Initial stenosis was 80% which was reduced to 0%. Again, the patient was noted to have either spasm distal to the stent placement or an edge dissection but the vessel was small in that area and given the lack of symptoms and EKG changes I decided to abort the procedure.  Recommendations: This was overall a difficult procedure due to the mentioned issues. Continue hydration to prevent contrast-induced nephropathy. In terms of anticoagulation, warfarin can be resumed tonight. Heparin can be resumed in 8 hours. For now continue with aspirin and Plavix. Once INR is therapeutic, aspirin can be discontinued. The patient can continue on warfarin and Plavix.            Assessment/Plan:  Acute on chronic diastolic CHF: weight really on down 2 pounds. Would continue with increased dose of Lasix and potassium. See back in 2 weeks.   CAD s/p DES to dLAD and rPDA (02/2014): continue plavix. No ASA due to coumadin. Continue BB and statin. No active symptoms reported.   HTN: BP not controlled. Not checking at home. He is agreeable to start monitoring. He is to show his AVS to his wife.   HLD: on triple therapy.   CKD: creat baseline ~2. Repeat his labs today.   Anemia of chronic disease: recently admitted for transfusion of blood and iron. No recent CBC noted - would update. This may be driving his issue with volume overload. Seeing Dr. Beryle Beams later this week for evaluation since no GI source noted.   Hx of DVT/bilateral PE: remains on coumadin therapy. No recent INR - will try to get checked today.   Current medicines are reviewed with the patient today.  The patient does not have concerns regarding medicines other than what has been noted above.  The following changes have been made:  See above.  Labs/ tests ordered today include:    Orders Placed This Encounter  Procedures  . Basic metabolic panel  . CBC  . Brain natriuretic peptide     Disposition:   FU with [name] as planned in October.   Patient is agreeable to this plan and will call if any problems develop in the interim.   Signed: Burtis Junes, RN, ANP-C 06/09/2015 8:58 AM  [facility] [address] [place], [place]  [address] Phone: [phone] Fax: [phone]       Addendum from Dr. Beryle Beams 06/11/2015 Hi - just saw him 5/16: note in EPIC; discrepancy on med list: my list says ASA, no Plavix. Your list -just the opposite. I would agree, given his hx, plavix probably safer than ASA since he has to be on chronic coumadin. Good news is his Hb up to 11.5 on PO iron. Don't think it will get much better w  creatinine 2     JG    ----- Message -----     From: Burtis Junes, NP     Sent: 06/09/2015  9:02 AM      To: Annia Belt, MD    I think we need to get his medicines verified.   Burtis Junes, RN, [facility] [address] [place], [place]  [address] [phone].

## 2015-06-10 ENCOUNTER — Ambulatory Visit (INDEPENDENT_AMBULATORY_CARE_PROVIDER_SITE_OTHER): Payer: BLUE CROSS/BLUE SHIELD | Admitting: Oncology

## 2015-06-10 ENCOUNTER — Encounter: Payer: Self-pay | Admitting: Oncology

## 2015-06-10 VITALS — BP 133/74 | HR 100 | Temp 98.9°F | Ht 70.0 in | Wt 228.0 lb

## 2015-06-10 DIAGNOSIS — Z7901 Long term (current) use of anticoagulants: Secondary | ICD-10-CM

## 2015-06-10 DIAGNOSIS — D72829 Elevated white blood cell count, unspecified: Secondary | ICD-10-CM

## 2015-06-10 DIAGNOSIS — N189 Chronic kidney disease, unspecified: Secondary | ICD-10-CM

## 2015-06-10 DIAGNOSIS — D631 Anemia in chronic kidney disease: Secondary | ICD-10-CM | POA: Diagnosis not present

## 2015-06-10 DIAGNOSIS — N183 Chronic kidney disease, stage 3 unspecified: Secondary | ICD-10-CM

## 2015-06-10 DIAGNOSIS — D510 Vitamin B12 deficiency anemia due to intrinsic factor deficiency: Secondary | ICD-10-CM

## 2015-06-10 DIAGNOSIS — D509 Iron deficiency anemia, unspecified: Secondary | ICD-10-CM

## 2015-06-10 DIAGNOSIS — Z832 Family history of diseases of the blood and blood-forming organs and certain disorders involving the immune mechanism: Secondary | ICD-10-CM

## 2015-06-10 DIAGNOSIS — D649 Anemia, unspecified: Secondary | ICD-10-CM

## 2015-06-10 DIAGNOSIS — D473 Essential (hemorrhagic) thrombocythemia: Secondary | ICD-10-CM

## 2015-06-10 DIAGNOSIS — E271 Primary adrenocortical insufficiency: Secondary | ICD-10-CM | POA: Diagnosis not present

## 2015-06-10 DIAGNOSIS — I2782 Chronic pulmonary embolism: Secondary | ICD-10-CM

## 2015-06-10 LAB — SAVE SMEAR

## 2015-06-10 NOTE — Patient Instructions (Signed)
Return for lab on June 16 MD visit 1 week after lab
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
## 2015-06-12 LAB — CBC WITH DIFFERENTIAL/PLATELET
Basophils Absolute: 0 10*3/uL (ref 0.0–0.2)
Basos: 0 %
EOS (ABSOLUTE): 0.2 10*3/uL (ref 0.0–0.4)
Eos: 1 %
HEMATOCRIT: 38.8 % (ref 37.5–51.0)
Hemoglobin: 12.2 g/dL — ABNORMAL LOW (ref 12.6–17.7)
IMMATURE GRANULOCYTES: 1 %
Immature Grans (Abs): 0.1 10*3/uL (ref 0.0–0.1)
Lymphocytes Absolute: 1.9 10*3/uL (ref 0.7–3.1)
Lymphs: 15 %
MCH: 26.4 pg — ABNORMAL LOW (ref 26.6–33.0)
MCHC: 31.4 g/dL — AB (ref 31.5–35.7)
MCV: 84 fL (ref 79–97)
MONOS ABS: 1.7 10*3/uL — AB (ref 0.1–0.9)
Monocytes: 13 %
NEUTROS PCT: 70 %
Neutrophils Absolute: 9.4 10*3/uL — ABNORMAL HIGH (ref 1.4–7.0)
Platelets: 378 10*3/uL (ref 150–379)
RBC: 4.62 x10E6/uL (ref 4.14–5.80)
RDW: 23.8 % — AB (ref 12.3–15.4)
WBC: 13.3 10*3/uL — AB (ref 3.4–10.8)

## 2015-06-12 LAB — PROTEIN ELECTROPHORESIS, SERUM
A/G RATIO SPE: 1.2 (ref 0.7–1.7)
Albumin ELP: 3.8 g/dL (ref 2.9–4.4)
Alpha 1: 0.2 g/dL (ref 0.0–0.4)
Alpha 2: 1 g/dL (ref 0.4–1.0)
BETA: 1.1 g/dL (ref 0.7–1.3)
GAMMA GLOBULIN: 0.8 g/dL (ref 0.4–1.8)
GLOBULIN, TOTAL: 3.1 g/dL (ref 2.2–3.9)

## 2015-06-12 LAB — KAPPA/LAMBDA LIGHT CHAINS
IG KAPPA FREE LIGHT CHAIN: 28.43 mg/L — AB (ref 3.30–19.40)
IG LAMBDA FREE LIGHT CHAIN: 24.52 mg/L (ref 5.71–26.30)
Kappa/Lambda FluidC Ratio: 1.16 (ref 0.26–1.65)

## 2015-06-12 LAB — LACTATE DEHYDROGENASE: LDH: 222 IU/L (ref 121–224)

## 2015-06-12 LAB — IMMUNOFIXATION ELECTROPHORESIS
IgA/Immunoglobulin A, Serum: 203 mg/dL (ref 61–437)
IgG (Immunoglobin G), Serum: 720 mg/dL (ref 700–1600)
IgM (Immunoglobulin M), Srm: 120 mg/dL (ref 15–143)
Total Protein: 6.9 g/dL (ref 6.0–8.5)

## 2015-06-12 LAB — RETICULOCYTES: RETIC CT PCT: 1.6 % (ref 0.6–2.6)

## 2015-06-22 NOTE — Progress Notes (Addendum)
Cardiology Office Note    Date:  06/24/2015   ID:  [name] 10/11/1940, MRN [id number]  PCP:   Melinda Crutch, MD  Cardiologist:  [name]   CHF follow up.    History of Present Illness:  [name] is a 75 y.o. 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  In 04/2014 he was diagnosed with bilateral submassive pulmonary emboli. He was seen by GI given prior history of GI bleeding and an EGD was performed which showed gastric erosions without evidence of active bleeding. PPI therapy was prescribed. He was started on coumadin and bridged with Lovenox.   He continued to have severe DOE. A cardiopulmonary stress test revealed mild functional impairment suggestive of a circulatory issue. He was set up for left and right heart catheterization which revealed significant 2V CAD. He underwent PCI/DES to dLAD and rPDA in 07/2014. He was put on ASA, plavix and coumadin for 1 month followed by only plavix and coumadin.   In 03/2015 he was seen in the office for fatige and SOB. He was found to be anemia and admitted for transfusion. He received one unit of blood and also a transfusion of iron.Stool quiac was negative  His plavix was discontinued.  He was scheduled to see a hemotologist / oncologist since there was no evidence of GI source. 2D ECHO showed EF 60% - 65%, G1DD and dilated aortic room at 44 mm.   He was seen by [name] for post hospital follow up in 04/30/2015. He felt his volume status was stable. Weight was 223lbs.  I saw him in clinic on 06/04/15 weight gain and fluid retention. He was up 10 lbs from previous (233 lbs on our scale.) I increased his last from 20mg  daily to 40mg  BID. He saw Truitt Merle NP for close follow up on 06/09/15. He had only lost 2 lbs and still felt dyspneic.  Creat was stable ~2. She kept him on lasix 40mg  BID.   Today he presents to clinic for follow up. He had been doing better in terms of fluid until this AM. He was running 221-222 lbs at home until this AM when he went back up to 228lbs. He isn't really feeling much better in terms of SOB; however, LE edema has improved a little. He does not feel like he is urinating a lot more than usual. No orthopnea or PND. No dizziness or syncope.    Past Medical History  Diagnosis Date  . Chronic diastolic CHF (congestive heart failure) ([place])     a. His initial ejection fraction was between 30 and 35%;  b. 04/2013 Echo: EF 50-55% Gr1 DD, mild-mod MR;  c. 04/2014 Echo: EF 60-65%, Gr 1 DD, triv TR.  Marland Kitchen Thyroid disease   . Pulmonary HTN ([place])   . Hypertension   . Addison's disease ([place])   . Hypogonadism male   . CKD (chronic kidney disease), stage III   . History of aortic insufficiency   . Hx of cardiovascular stress test     a. ETT-Myoview 7/14:  Low risk, inferior defect consistent with thinning, no ischemia, normal wall motion, EF 54%  . DVT (deep venous thrombosis) ([place])     a. 02/2013 superficial thrombophlebitis --> Xarelto later d/c'd 2/2 GIB;  b. 04/2014 Acute  Right PT, Peroneal, Popliteal, Femoral, and common femoral DVT-->coumadin  . GERD (gastroesophageal reflux disease)   . H/O hiatal hernia   . Hepatitis     a. 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  . Daily headache   . Chronic back pain   . Peptic ulcer disease   . On home oxygen therapy     a. added 04/2014 in setting of PE.  Marland Kitchen Acute lower GI bleeding     a. admitted to [facility] 04/11/2013;  b. 04/2014 EGD: 1.  gastric erosions, mild prox gastritis and bulbar duodenitis->Protonix.  . Pulmonary embolism ([employer])     a. 04/2014 CTA: Bilat submassive PE ->coumadin.  . Coronary artery disease     a. 07/2014 - s/p difficult PCI - had pressure wire analysis of LAD s/p DES in mid segment c/b wire-induced dissection in the distal segment of the LAD which was treated with repeated  balloon inflations. Also had DES to rPDA - again had either spasm distal to the stent placement or an edge dissection but the vessel was small in that area; procedure aborted.  . Obesity   . Anemia     Past Surgical History  Procedure Laterality Date  . US echocardiography  04/09/2010    EF 60-65%  . Tonsillectomy  1940's  . Appendectomy  1950's  . Cholecystectomy  ~ 1965  . Esophagogastroduodenoscopy N/A 04/11/2013    Procedure: ESOPHAGOGASTRODUODENOSCOPY (EGD);  Surgeon: Missy Sabins, MD;  Location: [facility] ENDOSCOPY;  Service: Endoscopy;  Laterality: N/A;  . Esophagogastroduodenoscopy N/A 05/13/2014    Procedure: ESOPHAGOGASTRODUODENOSCOPY (EGD);  Surgeon: Arta Silence, MD;  Location: [facility] ENDOSCOPY;  Service: Endoscopy;  Laterality: N/A;  . Cardiac catheterization  06/26/2008    EF 30-35%  . Cardiac catheterization N/A 08/20/2014    Procedure: Right/Left Heart Cath and Coronary Angiography;  Surgeon: Jolaine Artist, MD;  Location: [facility];  Service: Cardiovascular;  Laterality: N/A;  . Cardiac catheterization N/A 08/21/2014    Procedure: Coronary Stent Intervention;  Surgeon: Wellington Hampshire, MD;  Location: [facility];  Service: Cardiovascular;  Laterality: N/A;    Current Medications: Outpatient Prescriptions Prior to Visit  Medication Sig Dispense Refill  . atorvastatin (LIPITOR) 80 MG tablet Take 1 tablet (80 mg total) by mouth daily. 90 tablet 3  . carvedilol (COREG) 6.25 MG tablet Take 1 tablet (6.25 mg total) by mouth 2 (two) times daily with a meal. 180 tablet 3  . Cholecalciferol (VITAMIN D3 PO) Take 1,000 capsules by mouth daily.     Marland Kitchen ezetimibe (ZETIA) 10 MG tablet Take 1 tablet (10 mg total) by mouth daily. 30 tablet 11  . fenofibrate (TRICOR) 48 MG tablet Take 1 tablet (48 mg total) by mouth daily. 30 tablet 11  . ferrous sulfate 325 (65 FE) MG tablet Take 1 tablet (325 mg total) by mouth daily with breakfast. 30 tablet 0  . folic acid (FOLVITE) 1 MG  tablet Take 1 tablet (1 mg total) by mouth daily. 30 tablet 0  . gabapentin (NEURONTIN) 600 MG tablet Take 600 mg by mouth daily.    . hydrocortisone (CORTEF) 20 MG tablet Take 20 mg by mouth 3 (three) times daily.     . isosorbide mononitrate (IMDUR) 30 MG 24 hr tablet Take 0.5 tablets (15 mg total) by mouth daily.    Marland Kitchen lisinopril (PRINIVIL,ZESTRIL) 10 MG tablet Take 0.5 tablets (5 mg total) by mouth daily. 7 tablet 0  . metoCLOPramide (REGLAN) 5 MG tablet Take  5 mg by mouth daily.     . nitroGLYCERIN (NITROSTAT) 0.4 MG SL tablet Place 1 tablet (0.4 mg total) under the tongue every 5 (five) minutes as needed for chest pain (up to 3 doses). 25 tablet 3  . pantoprazole (PROTONIX) 40 MG tablet Take 1 tablet (40 mg total) by mouth daily. 30 tablet 0  . PROAIR HFA 108 (90 BASE) MCG/ACT inhaler INHALE 2 PUFFS INTO THE LUNGS EVERY 6 (SIX) HOURS AS NEEDED FOR WHEEZING OR SHORTNESS OF BREATH. 8.5 Inhaler 2  . promethazine (PHENERGAN) 25 MG tablet Take 25 mg by mouth every 6 (six) hours as needed for nausea.     Marland Kitchen thyroid (ARMOUR) 60 MG tablet Take 60 mg by mouth daily.     . traMADol (ULTRAM) 50 MG tablet Take 100 mg by mouth 3 (three) times daily.     . traZODone (DESYREL) 100 MG tablet Take 100 mg by mouth every evening.   2  . warfarin (COUMADIN) 3 MG tablet Please take 6mg  on 3/24, then go back to home dose 3mg  every day except Monday and Friday . On Monday and Friday take 1.5mg . Have INR checked on Monday 3/27. 30 tablet 3  . warfarin (COUMADIN) 3 MG tablet TAKE AS DIRECTED BY COUMADIN CLINIC 30 tablet 3  . furosemide (LASIX) 40 MG tablet Take 1 tablet (40 mg total) by mouth daily. 90 tablet 3  . potassium chloride (K-DUR,KLOR-CON) 20 MEQ tablet Take 1 tablet (20 mEq total) by mouth 2 (two) times daily. 90 tablet 3   No facility-administered medications prior to visit.     Allergies:   Review of patient's allergies indicates no active allergies.   Social History   Social History  . Marital  Status: Married    Spouse Name: N/A  . Number of Children: N/A  . Years of Education: N/A   Occupational History  . retired    Social History Main Topics  . Smoking status: Never Smoker   . Smokeless tobacco: Never Used  . Alcohol Use: No  . Drug Use: No  . Sexual Activity: Not Asked   Other Topics Concern  . None   Social History Narrative     Family History:  The patient's family history includes Clotting disorder in his brother, brother, brother, mother, and other. There is no history of Heart attack.   ROS:   Please see the history of present illness.    ROS All other systems reviewed and are negative.   PHYSICAL EXAM:   VS:  BP 158/92 mmHg  Pulse 80  Ht 5\' 9"  (1.753 m)  Wt 233 lb 12.8 oz (106.051 kg)  BMI 34.51 kg/m2   GEN: Well nourished, well developed, in no acute distress HEENT: normal Neck: no JVD, carotid bruits, or masses Cardiac: RRR; no murmurs, rubs, or gallops, Trace bilateral LE edema to knees. + JVD Respiratory: normal breath sounds.  normal work of breathing GI: soft, nontender, nondistended, + BS MS: no deformity or atrophy Skin: warm and dry, no rash Neuro:  Alert and Oriented x 3, Strength and sensation are intact Psych: euthymic mood, full affect  Wt Readings from Last 3 Encounters:  06/24/15 233 lb 12.8 oz (106.051 kg)  06/10/15 228 lb (103.42 kg)  06/09/15 231 lb 12.8 oz (105.144 kg)      Studies/Labs Reviewed:   EKG:  EKG is ordered today.    Recent Labs: 04/15/2015: TSH 1.24 06/04/2015: ALT 18 06/09/2015: Brain Natriuretic Peptide 23.5; BUN 25; Creat  2.03*; Hemoglobin 11.5*; Potassium 3.8; Sodium 144 06/10/2015: Platelets 378   Lipid Panel    Component Value Date/Time   CHOL 177 02/27/2015 1256   TRIG 223* 02/27/2015 1256   HDL 41 02/27/2015 1256   CHOLHDL 4.3 02/27/2015 1256   VLDL 45* 02/27/2015 1256   LDLCALC 91 02/27/2015 1256   LDLDIRECT 58 06/04/2015 1450    Additional studies/ records that were reviewed today  include:    2D ECHO: 04/17/2015 LV EF: 60% - 65% Study Conclusions - Left ventricle: The cavity size was normal. Wall thickness was  increased in a pattern of moderate LVH. Systolic function was  normal. The estimated ejection fraction was in the range of 60%  to 65%. Wall motion was normal; there were no regional wall  motion abnormalities. Doppler parameters are consistent with  abnormal left ventricular relaxation (grade 1 diastolic  dysfunction). The E/e&' ratio is between 8-15, suggesting  indeterminate LV filling pressure. - Aortic valve: Trileaflet. Sclerosis without stenosis. There was  trivial regurgitation. - Aorta: Aortic root dimension: 44 mm (ED). - Aortic root: The aortic root is dilated. - Ascending aorta: The ascending aorta was normal in size. - Mitral valve: Calcified annulus. There was trivial regurgitation. - Left atrium: Moderately dilated at 40 ml/m2. - Tricuspid valve: There was no significant regurgitation. - Inferior vena cava: The vessel was normal in size. The  respirophasic diameter changes were in the normal range (= 50%),  consistent with normal central venous pressure. Impressions: - Compared to a prior study in 2016, there are few changes. The  aortic root measures 4.4 cm. The ascending aorta measures up to 4  cm at the most distal visualized point.   08/20/14 L/[facility] Procedures    Right/Left Heart Cath and Coronary Angiography    Conclusion     Dist LAD lesion, 80% stenosed.  Mid RCA lesion, 20% stenosed.  RPDA lesion, 95% stenosed.  RA = 6 RV = 28/4/10 PA = 29/15 (21) PCWP = 15 Ao = 148/77 (107) LV = 138/5/18 Fick CO/CI = 7.8/3.6 Thermo CO/CI = 6.5/3.0 PVR < 1 WU Ao sat 93% PA sat = 71%, 74% High SVC sat = 68% RA sat = 69%   Assessment: 1) 2V CAD  2) Normal pulmonary pressures with elevated cardiac output but no evidence of intracardiac shunt 3) Normal EF by echo Plan/Discussion: Despite his obesity and  recent PE, his PA pressures are surprisingly normal. Suspect some of his dyspnea related to his body habitus but also has significant 2V CAD and will need PCI. With PE 3 months ago will discuss timing of intervention and possibility of triple-drug therapy with [name] and the interventional team.    08/21/14 Procedures    Coronary Stent Intervention    Conclusion    1. Successful pressure wire interrogation of the LAD with subsequent placement of a drug-eluting stent in the midsegment for an initial 70% stenosis with 0% residual stenosis. This was complicated by a wire-induced dissection in the distal segment of the LAD which was treated with repeated balloon inflations.  2. Successful drug-eluting stent placement to the right PDA. Initial stenosis was 80% which was reduced to 0%. Again, the patient was noted to have either spasm distal to the stent placement or an edge dissection but the vessel was small in that area and given the lack of symptoms and EKG changes I decided to abort the procedure.  Recommendations: This was overall a difficult procedure due to the mentioned issues. Continue  hydration to prevent contrast-induced nephropathy. In terms of anticoagulation, warfarin can be resumed tonight. Heparin can be resumed in 8 hours. For now continue with aspirin and Plavix. Once INR is therapeutic, aspirin can be discontinued. The patient can continue on warfarin and Plavix.       ASSESSMENT:    1. Acute on chronic diastolic CHF (congestive heart failure), NYHA class 2 ([place])   2. CAD S/P percutaneous coronary angioplasty   3. Essential hypertension   4. Hyperlipidemia   5. CKD (chronic kidney disease), unspecified stage   6. Anemia of chronic disease   7. Other pulmonary embolism without acute cor pulmonale, unspecified chronicity (H[facility])      PLAN:  In order of problems listed above:  Acute on chronic diastolic CHF:  Has been on 40mg  BID and Kdur 62mEq BID x 3 weeks. Weight  has not budged on our scale- still 233 lbs despite increased lasix. He has not a good urine output on increased Lasix. He is still SOB. With this CKD I don't think that I gave him enough Lasix. Will increase Lasix to 80 mg BID and KDur 18meQ BID and see how he does. BMET today. I will see him back in the clinic next week for close follow up.   CAD s/p DES to dLAD and rPDA (02/2014): continue plavix. No ASA due to coumadin. Continue BB and statin.   HTN: BP High today, but wife says that it's been running good at home. Will not change medications today.  HLD: continue statin, Zetia and fenofibrate   CKD: creat baseline ~2. Check BMET today   Anemia of chronic disease: recently admitted for transfusion of blood and iron. Followed by Dr. Beryle Beams for evaluation since no GI source noted.   Hx of DVT/bilateral PE: continue coumadin  Medication Adjustments/Labs and Tests Ordered: Current medicines are reviewed at length with the patient today.  Concerns regarding medicines are outlined above.  Medication changes, Labs and Tests ordered today are listed in the Patient Instructions below. Patient Instructions  Medication Instructions:  Your physician has recommended you make the following change in your medication:  1.  INCREASE the Lasix to 40 mg taking 2 tablets by mouth twice a day X's 3 days then go back to 1 tablet by mouth twice a day 2.  INCREASE the Potassium 20 meq to 2 tablets by mouth daily X's 3 days then go back to 1 tablet daily  Labwork: TODAY:  BMET Friday 06/27/15:  BMET  Testing/Procedures: None ordered  Follow-Up: Your physician recommends that you schedule a follow-up appointment in:   07/02/15 TO SEE KATIE Kiernan Atkerson, PA-C  Any Other Special Instructions Will Be Listed Below (If Applicable).    If you need a refill on your cardiac medications before your next appointment, please call your pharmacy.       Signed, Angelena Form, PA-C  06/24/2015 11:09 AM    [facility] [address], [place], [place]  [address] Phone: [phone]; Fax: [phone]

## 2015-06-24 ENCOUNTER — Ambulatory Visit (INDEPENDENT_AMBULATORY_CARE_PROVIDER_SITE_OTHER): Payer: BLUE CROSS/BLUE SHIELD | Admitting: Physician Assistant

## 2015-06-24 ENCOUNTER — Encounter: Payer: Self-pay | Admitting: Physician Assistant

## 2015-06-24 VITALS — BP 158/92 | HR 80 | Ht 69.0 in | Wt 233.8 lb

## 2015-06-24 DIAGNOSIS — I10 Essential (primary) hypertension: Secondary | ICD-10-CM

## 2015-06-24 DIAGNOSIS — I2510 Atherosclerotic heart disease of native coronary artery without angina pectoris: Secondary | ICD-10-CM

## 2015-06-24 DIAGNOSIS — I5033 Acute on chronic diastolic (congestive) heart failure: Secondary | ICD-10-CM | POA: Diagnosis not present

## 2015-06-24 DIAGNOSIS — D638 Anemia in other chronic diseases classified elsewhere: Secondary | ICD-10-CM

## 2015-06-24 DIAGNOSIS — E785 Hyperlipidemia, unspecified: Secondary | ICD-10-CM

## 2015-06-24 DIAGNOSIS — Z9861 Coronary angioplasty status: Secondary | ICD-10-CM

## 2015-06-24 DIAGNOSIS — N189 Chronic kidney disease, unspecified: Secondary | ICD-10-CM

## 2015-06-24 DIAGNOSIS — I2699 Other pulmonary embolism without acute cor pulmonale: Secondary | ICD-10-CM

## 2015-06-24 LAB — BASIC METABOLIC PANEL
BUN: 23 mg/dL (ref 7–25)
CALCIUM: 9.4 mg/dL (ref 8.6–10.3)
CO2: 27 mmol/L (ref 20–31)
Chloride: 104 mmol/L (ref 98–110)
Creat: 1.73 mg/dL — ABNORMAL HIGH (ref 0.70–1.18)
GLUCOSE: 76 mg/dL (ref 65–99)
Potassium: 3.6 mmol/L (ref 3.5–5.3)
Sodium: 143 mmol/L (ref 135–146)

## 2015-06-24 MED ORDER — FUROSEMIDE 40 MG PO TABS: ORAL_TABLET | ORAL | Status: DC

## 2015-06-24 MED ORDER — POTASSIUM CHLORIDE CRYS ER 20 MEQ PO TBCR: EXTENDED_RELEASE_TABLET | ORAL | Status: DC

## 2015-06-24 NOTE — Patient Instructions (Addendum)
Medication Instructions:  Your physician has recommended you make the following change in your medication:  1.  INCREASE the Lasix to 40 mg taking 2 tablets by mouth twice a day X's 3 days then go back to 1 tablet by mouth twice a day 2.  INCREASE the Potassium 20 meq to 2 tablets by mouth daily X's 3 days then go back to 1 tablet daily  Labwork: TODAY:  BMET Friday 06/27/15:  BMET  Testing/Procedures: None ordered  Follow-Up: Your physician recommends that you schedule a follow-up appointment in:   07/02/15 TO SEE KATIE THOMPSON, PA-C  Any Other Special Instructions Will Be Listed Below (If Applicable).    If you need a refill on your cardiac medications before your next appointment, please call your pharmacy.

## 2015-06-25 ENCOUNTER — Encounter: Payer: Self-pay | Admitting: Oncology

## 2015-06-25 DIAGNOSIS — D631 Anemia in chronic kidney disease: Secondary | ICD-10-CM

## 2015-06-25 DIAGNOSIS — N189 Chronic kidney disease, unspecified: Secondary | ICD-10-CM

## 2015-06-25 HISTORY — DX: Anemia in chronic kidney disease: D63.1

## 2015-06-27 ENCOUNTER — Other Ambulatory Visit: Payer: BLUE CROSS/BLUE SHIELD

## 2015-06-30 NOTE — Progress Notes (Signed)
  ROS  This encounter was created in error - please disregard. 

## 2015-07-02 ENCOUNTER — Other Ambulatory Visit: Payer: Self-pay | Admitting: Physician Assistant

## 2015-07-02 ENCOUNTER — Encounter: Payer: BLUE CROSS/BLUE SHIELD | Admitting: Physician Assistant

## 2015-07-08 ENCOUNTER — Other Ambulatory Visit: Payer: Self-pay | Admitting: Emergency Medicine

## 2015-07-11 ENCOUNTER — Other Ambulatory Visit: Payer: BLUE CROSS/BLUE SHIELD

## 2015-07-21 ENCOUNTER — Encounter: Payer: Self-pay | Admitting: Physician Assistant

## 2015-07-22 ENCOUNTER — Encounter: Payer: BLUE CROSS/BLUE SHIELD | Admitting: Oncology

## 2015-07-22 ENCOUNTER — Encounter: Payer: Self-pay | Admitting: Oncology

## 2015-08-06 ENCOUNTER — Other Ambulatory Visit: Payer: Self-pay

## 2015-08-06 MED ORDER — FUROSEMIDE 40 MG PO TABS: 40.0000 mg | ORAL_TABLET | Freq: Two times a day (BID) | ORAL | Status: DC

## 2015-08-18 ENCOUNTER — Ambulatory Visit (INDEPENDENT_AMBULATORY_CARE_PROVIDER_SITE_OTHER): Payer: BLUE CROSS/BLUE SHIELD

## 2015-08-18 ENCOUNTER — Ambulatory Visit (INDEPENDENT_AMBULATORY_CARE_PROVIDER_SITE_OTHER): Payer: BLUE CROSS/BLUE SHIELD | Admitting: Physician Assistant

## 2015-08-18 ENCOUNTER — Encounter: Payer: Self-pay | Admitting: Physician Assistant

## 2015-08-18 VITALS — BP 126/82 | HR 99 | Ht 70.0 in | Wt 231.4 lb

## 2015-08-18 DIAGNOSIS — I5032 Chronic diastolic (congestive) heart failure: Secondary | ICD-10-CM | POA: Diagnosis not present

## 2015-08-18 DIAGNOSIS — Z5181 Encounter for therapeutic drug level monitoring: Secondary | ICD-10-CM

## 2015-08-18 DIAGNOSIS — N183 Chronic kidney disease, stage 3 (moderate): Secondary | ICD-10-CM

## 2015-08-18 DIAGNOSIS — Z9861 Coronary angioplasty status: Secondary | ICD-10-CM

## 2015-08-18 DIAGNOSIS — I2782 Chronic pulmonary embolism: Secondary | ICD-10-CM

## 2015-08-18 DIAGNOSIS — I2510 Atherosclerotic heart disease of native coronary artery without angina pectoris: Secondary | ICD-10-CM

## 2015-08-18 DIAGNOSIS — R0600 Dyspnea, unspecified: Secondary | ICD-10-CM

## 2015-08-18 DIAGNOSIS — I10 Essential (primary) hypertension: Secondary | ICD-10-CM

## 2015-08-18 LAB — BASIC METABOLIC PANEL
BUN: 35 mg/dL — AB (ref 7–25)
CALCIUM: 9.3 mg/dL (ref 8.6–10.3)
CO2: 28 mmol/L (ref 20–31)
CREATININE: 2.63 mg/dL — AB (ref 0.70–1.18)
Chloride: 101 mmol/L (ref 98–110)
GLUCOSE: 105 mg/dL — AB (ref 65–99)
Potassium: 3.4 mmol/L — ABNORMAL LOW (ref 3.5–5.3)
Sodium: 141 mmol/L (ref 135–146)

## 2015-08-18 LAB — POCT INR: INR: 2.5

## 2015-08-18 NOTE — Patient Instructions (Addendum)
Medication Instructions:   Your physician recommends that you continue on your current medications as directed. Please refer to the Current Medication list given to you today.    If you need a refill on your cardiac medications before your next appointment, please call your pharmacy.  Labwork:                   BMET TODAY      Testing/Procedures: NONE ORDER TODAY    Follow-Up: WITH DR Acie Fredrickson  NEXT AVAILABLE    Any Other Special Instructions Will Be Listed Below (If Applicable).  YOU HAVE BEEN RECCOMENDED TO  CONTACT PULMONARY PROVIDER DR Zandra Abts TO MAKE AN FOLLOW UP APPT

## 2015-08-18 NOTE — Progress Notes (Signed)
Cardiology Office Note    Date:  08/18/2015   ID:  [name], [name] 1940/03/15, MRN [id number]  PCP:   Melinda Crutch, MD  Cardiologist:   Chief Complaint  Patient presents with  . Follow-up  . Shortness of Breath    all the time     History of Present Illness:  [name] is a 75 y.o. 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 He was seen by GI given prior history of GI bleeding and an EGD was performed which showed gastric erosions without evidence of active bleeding. PPI therapy was prescribed. He was started on coumadin and bridged with Lovenox.   He continued to have  severe dyspnea on exertion. Pulmonary stress testing revealed mild functional impairment suggestive of a circulatory issue. Right and left heart catheterization revealed significant 2 vessel CAD and he underwent PCI/DES to the LAD and PDA 07/2014. He was put on aspirin, Plavix and Coumadin for one month followed by only Plavix and Coumadin. In 03/2015 he was found to be anemic and was admitted for transfusion. His Plavix was discontinued. He sees hematology/oncology and no GI source was identified. 2-D echo EF 60-65% with grade 1 DD and dilated aortic root of 44 mm.  He last saw Nell Range, [place] in clinic 06/24/15 and his lower extremity edema had improved but he has chronic dyspnea on exertion. She increased his Lasix to 80 mg twice a day. He was supposed to f/u in 1 week but his brother died. Lasix was back to 40 mg twice a day. He's had worsening dyspnea over the past 2 weeks. He saw primary care last week and they increased his Lasix to 80 mg in the morning 40 mg in the afternoon. He hasn't noticed much difference. They felt he had some crackles in his lungs and wanted him seen here. He is still short of breath  at rest and with exertion. He hasn't had much edema.      Past Medical History:  Diagnosis Date  . Acute lower GI bleeding    a. admitted to [facility] 04/11/2013;  b. 04/2014 EGD: 1.  gastric erosions, mild prox gastritis and bulbar duodenitis->Protonix.  . Addison's disease ([place])   . Anemia   . Anemia in CKD (chronic kidney disease) 06/25/2015  . Chronic back pain   . Chronic diastolic CHF (congestive heart failure) ([place])    a. His initial ejection fraction was between 30 and 35%;  b. 04/2013 Echo: EF 50-55% Gr1 DD, mild-mod MR;  c. 04/2014 Echo: EF 60-65%, Gr 1 DD, triv TR.  . CKD (chronic kidney disease), stage III   . Coronary artery disease    a. 07/2014 - s/p difficult PCI - had pressure wire analysis of LAD s/p DES in mid segment c/b wire-induced dissection in the distal segment of the LAD which was treated with repeated balloon inflations. Also had DES to rPDA - again had either spasm distal to the stent placement or an edge dissection but the vessel was small in that area; procedure aborted.  . Daily headache   . DVT (deep venous thrombosis) ([place])    a. 02/2013 superficial thrombophlebitis --> Xarelto later d/c'd 2/2 GIB;  b. 04/2014 Acute Right PT, Peroneal, Popliteal, Femoral, and common femoral DVT-->coumadin  . GERD (gastroesophageal  reflux disease)   . H/O hiatal hernia   . Hepatitis    a. 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  . History of aortic insufficiency   . Hx of cardiovascular stress test    a. ETT-Myoview 7/14:  Low risk, inferior defect consistent with thinning, no ischemia, normal wall motion, EF 54%  . Hypertension   . Hypogonadism male   . Obesity   . On home oxygen therapy    a. added 04/2014 in setting of PE.  Marland Kitchen Peptic ulcer disease   . Pulmonary embolism ([place])    a. 04/2014 CTA: Bilat submassive PE ->coumadin.  . Pulmonary HTN ([place])   . Thyroid disease     Past Surgical History:  Procedure Laterality Date  . APPENDECTOMY  1950's  . CARDIAC CATHETERIZATION  06/26/2008   EF 30-35%    . CARDIAC CATHETERIZATION N/A 08/20/2014   Procedure: Right/Left Heart Cath and Coronary Angiography;  Surgeon: Jolaine Artist, MD;  Location: [place] [facility];  Service: Cardiovascular;  Laterality: N/A;  . CARDIAC CATHETERIZATION N/A 08/21/2014   Procedure: Coronary Stent Intervention;  Surgeon: Wellington Hampshire, MD;  Location: [place] [facility] LAB;  Service: Cardiovascular;  Laterality: N/A;  . CHOLECYSTECTOMY  ~ 1965  . ESOPHAGOGASTRODUODENOSCOPY N/A 04/11/2013   Procedure: ESOPHAGOGASTRODUODENOSCOPY (EGD);  Surgeon: Missy Sabins, MD;  Location: [facility] ENDOSCOPY;  Service: Endoscopy;  Laterality: N/A;  . ESOPHAGOGASTRODUODENOSCOPY N/A 05/13/2014   Procedure: ESOPHAGOGASTRODUODENOSCOPY (EGD);  Surgeon: Arta Silence, MD;  Location: [facility] ENDOSCOPY;  Service: Endoscopy;  Laterality: N/A;  . TONSILLECTOMY  1940's  . US ECHOCARDIOGRAPHY  04/09/2010   EF 60-65%    Current Medications: Outpatient Medications Prior to Visit  Medication Sig Dispense Refill  . atorvastatin (LIPITOR) 80 MG tablet Take 1 tablet (80 mg total) by mouth daily. 90 tablet 3  . carvedilol (COREG) 6.25 MG tablet Take 1 tablet (6.25 mg total) by mouth 2 (two) times daily with a meal. 180 tablet 3  . Cholecalciferol (VITAMIN D3 PO) Take 1,000 capsules by mouth daily.     Marland Kitchen ezetimibe (ZETIA) 10 MG tablet Take 1 tablet (10 mg total) by mouth daily. 30 tablet 11  . fenofibrate (TRICOR) 48 MG tablet Take 1 tablet (48 mg total) by mouth daily. 30 tablet 11  . ferrous sulfate 325 (65 FE) MG tablet Take 1 tablet (325 mg total) by mouth daily with breakfast. 30 tablet 0  . folic acid (FOLVITE) 1 MG tablet Take 1 tablet (1 mg total) by mouth daily. 30 tablet 0  . furosemide (LASIX) 40 MG tablet Take 1 tablet (40 mg total) by mouth 2 (two) times daily. 60 tablet 1  . gabapentin (NEURONTIN) 600 MG tablet Take 600 mg by mouth daily.    . hydrocortisone (CORTEF) 20 MG tablet Take 20 mg by mouth 3 (three) times daily.     . isosorbide  mononitrate (IMDUR) 30 MG 24 hr tablet Take 0.5 tablets (15 mg total) by mouth daily.    . isosorbide mononitrate (IMDUR) 30 MG 24 hr tablet TAKE 1 TABLET (30 MG TOTAL) BY MOUTH DAILY. 30 tablet 0  . lisinopril (PRINIVIL,ZESTRIL) 10 MG tablet Take 0.5 tablets (5 mg total) by mouth daily. 7 tablet 0  . metoCLOPramide (REGLAN) 5 MG tablet Take 5 mg by mouth daily.     . nitroGLYCERIN (NITROSTAT) 0.4 MG SL tablet Place 1 tablet (0.4 mg total) under the tongue every 5 (five) minutes as needed for chest pain (up to 3 doses). 25  tablet 3  . pantoprazole (PROTONIX) 40 MG tablet Take 1 tablet (40 mg total) by mouth daily. 30 tablet 0  . potassium chloride SA (K-DUR,KLOR-CON) 20 MEQ tablet TAKE 2 TABLETS BY MOUTH DAILY X'S 3 DAYS THEN GO BACK TO 1 TABLET BY MOUTH DAILY 90 tablet 3  . PROAIR HFA 108 (90 Base) MCG/ACT inhaler INHALE 2 PUFFS INTO THE LUNGS EVERY 6 (SIX) HOURS AS NEEDED FOR WHEEZING OR SHORTNESS OF BREATH. 8.5 Inhaler 5  . promethazine (PHENERGAN) 25 MG tablet Take 25 mg by mouth every 6 (six) hours as needed for nausea.     Marland Kitchen thyroid (ARMOUR) 60 MG tablet Take 60 mg by mouth daily.     . traMADol (ULTRAM) 50 MG tablet Take 100 mg by mouth 3 (three) times daily.     . traZODone (DESYREL) 100 MG tablet Take 100 mg by mouth every evening.   2  . warfarin (COUMADIN) 3 MG tablet Please take 6mg  on 3/24, then go back to home dose 3mg  every day except Monday and Friday . On Monday and Friday take 1.5mg . Have INR checked on Monday 3/27. 30 tablet 3  . warfarin (COUMADIN) 3 MG tablet TAKE AS DIRECTED BY COUMADIN CLINIC 30 tablet 3   No facility-administered medications prior to visit.      Allergies:   Review of patient's allergies indicates no active allergies.   Social History   Social History  . Marital status: Married    Spouse name: N/A  . Number of children: N/A  . Years of education: N/A   Occupational History  . retired    Social History Main Topics  . Smoking status: Never Smoker   . Smokeless tobacco: Never Used  . Alcohol use No  . Drug use: No  . Sexual activity: Not Asked   Other Topics Concern  . None   Social History Narrative  . None     Family History:  The patient's   family history includes Clotting disorder in his brother, brother, brother, mother, and other.   ROS:   Please see the history of present illness.    Review of Systems  Constitution: Negative.  HENT: Negative.   Cardiovascular: Negative.   Respiratory: Negative.   Endocrine: Negative.   Hematologic/Lymphatic: Negative.   Musculoskeletal: Negative.   Gastrointestinal: Negative.   Genitourinary: Negative.   Neurological: Negative.    All other systems reviewed and are negative.   PHYSICAL EXAM:   VS:  BP 126/82 (BP Location: Left Arm, Patient Position: Sitting, Cuff Size: Normal)   Pulse 99   Ht 5\' 10"  (1.778 m)   Wt 231 lb 6.4 oz (105 kg)   SpO2 92%   BMI 33.20 kg/m   Physical Exam  GEN: Obese well developed, wheezing at rest Neck: no JVD, carotid bruits, or masses Cardiac:RRR; no murmurs, rubs, or gallops  Respiratory:  Decreased breath sounds throughout with upper respiratory wheezing, no rales or rhonchi  GI: soft, nontender, nondistended, + BS Ext: without cyanosis, clubbing, or edema, Good distal pulses bilaterally MS: no deformity or atrophy  Skin: warm and dry, no rash Neuro:  Alert and Oriented x 3, Strength and sensation are intact Psych: euthymic mood, full affect  Wt Readings from Last 3 Encounters:  08/18/15 231 lb 6.4 oz (105 kg)  06/24/15 233 lb 12.8 oz (106.1 kg)  06/10/15 228 lb (103.4 kg)      Studies/Labs Reviewed:   EKG:  EKG is not ordered today.  Recent Labs: 04/15/2015: TSH 1.24 06/04/2015: ALT 18 06/09/2015: Brain Natriuretic Peptide 23.5; Hemoglobin 11.5 06/10/2015: Platelets 378 06/24/2015: BUN 23; Creat 1.73; Potassium 3.6; Sodium 143   Lipid Panel    Component Value Date/Time   CHOL 177 02/27/2015 1256   TRIG 223 (H)  02/27/2015 1256   HDL 41 02/27/2015 1256   CHOLHDL 4.3 02/27/2015 1256   VLDL 45 (H) 02/27/2015 1256   LDLCALC 91 02/27/2015 1256   LDLDIRECT 58 06/04/2015 1450    Additional studies/ records that were reviewed today include:  2D ECHO: 04/17/2015 LV EF: 60% -   65% Study Conclusions - Left ventricle: The cavity size was normal. Wall thickness was   increased in a pattern of moderate LVH. Systolic function was   normal. The estimated ejection fraction was in the range of 60%   to 65%. Wall motion was normal; there were no regional wall   motion abnormalities. Doppler parameters are consistent with   abnormal left ventricular relaxation (grade 1 diastolic   dysfunction). The E/e&' ratio is between 8-15, suggesting   indeterminate LV filling pressure. - Aortic valve: Trileaflet. Sclerosis without stenosis. There was   trivial regurgitation. - Aorta: Aortic root dimension: 44 mm (ED). - Aortic root: The aortic root is dilated. - Ascending aorta: The ascending aorta was normal in size. - Mitral valve: Calcified annulus. There was trivial regurgitation. - Left atrium: Moderately dilated at 40 ml/m2. - Tricuspid valve: There was no significant regurgitation. - Inferior vena cava: The vessel was normal in size. The   respirophasic diameter changes were in the normal range (= 50%),   consistent with normal central venous pressure. Impressions: - Compared to a prior study in 2016, there are few changes. The   aortic root measures 4.4 cm. The ascending aorta measures up to 4   cm at the most distal visualized point.     08/20/14 L/RHC    Procedures     Right/Left Heart Cath and Coronary Angiography     Conclusion      Dist LAD lesion, 80% stenosed.  Mid RCA lesion, 20% stenosed.  RPDA lesion, 95% stenosed.   RA = 6 RV = 28/4/10 PA = 29/15 (21) PCWP = 15 Ao = 148/77 (107) LV = 138/5/18 Fick CO/CI  = 7.8/3.6 Thermo CO/CI = 6.5/3.0 PVR < 1 WU Ao sat 93% PA sat = 71%,  74% High SVC sat = 68% RA sat = 69%    Assessment: 1) 2V CAD   2) Normal pulmonary pressures with elevated cardiac output but no evidence of intracardiac shunt 3) Normal EF by echo Plan/Discussion: Despite his obesity and recent PE, his PA pressures are surprisingly normal. Suspect some of his dyspnea related to his body habitus but also has significant 2V CAD and will need PCI. With PE 3 months ago will discuss timing of intervention and possibility of triple-drug therapy with [name] and the interventional team.      08/21/14    Procedures     Coronary Stent Intervention     Conclusion     1. Successful pressure wire interrogation of the LAD with subsequent placement of a drug-eluting stent in the midsegment for an initial 70% stenosis with 0% residual stenosis. This was complicated by a wire-induced dissection in the distal segment of the LAD which was treated with repeated balloon inflations.   2. Successful drug-eluting stent placement to the right PDA. Initial stenosis was 80% which was reduced to 0%. Again, the patient  was noted to have either spasm distal to the stent placement or an edge dissection but the vessel was small in that area and given the lack of symptoms and EKG changes I decided to abort the procedure.   Recommendations: This was overall a difficult procedure due to the mentioned issues. Continue hydration to prevent contrast-induced nephropathy. In terms of anticoagulation, warfarin can be resumed tonight. Heparin can be resumed in 8 hours. For now continue with aspirin and Plavix. Once INR is therapeutic, aspirin can be discontinued. The patient can continue on warfarin and Plavix.          ASSESSMENT:    1. CKD (chronic kidney disease), stage 3 (moderate)   2. Chronic diastolic (congestive) heart failure ([place])   3. CAD S/P percutaneous coronary angioplasty   4. Essential hypertension   5. Dyspnea      PLAN:  In order of problems listed above:  CK  D we'll check renal function today since he's been taken extra Lasix  Chronic diastolic heart failure patient's weight is down 2 pounds since last office visit his breathing has not improved since primary care increased his Lasix to 80 mg in the morning 40 in the afternoon. I will give him a trial of 80 mg twice a day for 2 days but then back to 80 in the morning 40 in the afternoon after that. I suspect most his symptoms are coming from his COPD. Follow-up with Dr.[name] in 1 month.  CAD stable without angina  Hypertension blood pressure well controlled  Dyspnea I suspect most his symptoms are due to COPD. His O2 sat is 92% here. He can check his O2 sats at home which have asked him to do with walking. He has not seen pulmonary in a long time so I have asked him to schedule appointment. He is wheezing today and the only medication he is on is albuterol.    Medication Adjustments/Labs and Tests Ordered: Current medicines are reviewed at length with the patient today.  Concerns regarding medicines are outlined above.  Medication changes, Labs and Tests ordered today are listed in the Patient Instructions below. Patient Instructions  Medication Instructions:   Your physician recommends that you continue on your current medications as directed. Please refer to the Current Medication list given to you today.    If you need a refill on your cardiac medications before your next appointment, please call your pharmacy.  Labwork:                   BMET TODAY      Testing/Procedures: NONE ORDER TODAY    Follow-Up: WITH DR Acie Fredrickson  NEXT AVAILABLE    Any Other Special Instructions Will Be Listed Below (If Applicable).  Sumner Boast, PA-C  08/18/2015 11:05 AM    [facility] [address], [place],  [place]  [address] Phone: [phone]; Fax: [phone]

## 2015-08-20 ENCOUNTER — Telehealth: Payer: Self-pay | Admitting: *Deleted

## 2015-08-20 ENCOUNTER — Ambulatory Visit: Payer: BLUE CROSS/BLUE SHIELD | Admitting: Physician Assistant

## 2015-08-20 DIAGNOSIS — I10 Essential (primary) hypertension: Secondary | ICD-10-CM

## 2015-08-20 NOTE — Telephone Encounter (Signed)
-----   Message from Imogene Burn, PA-C sent at 08/20/2015  7:49 AM EDT ----- Please inform patient kidney function worse. Decrease lasix to 40 mg twice a day. Repeat bmet next week. May need to see renal if it doesn't improve A copey of the test result will also be sent to  Melinda Crutch, MD

## 2015-08-20 NOTE — Telephone Encounter (Signed)
Pt wife, [name], DPR on file, was made aware of pts lab results.  She will advise the pt of medication change and the need to repeat bmet 08/27/15. [name] is agreeable with this plan and was very appreciative. Med change and lab orders in [facility]

## 2015-08-25 ENCOUNTER — Encounter: Payer: Self-pay | Admitting: Oncology

## 2015-08-25 ENCOUNTER — Encounter: Payer: BLUE CROSS/BLUE SHIELD | Admitting: Oncology

## 2015-08-28 ENCOUNTER — Other Ambulatory Visit: Payer: BLUE CROSS/BLUE SHIELD | Admitting: *Deleted

## 2015-08-28 DIAGNOSIS — I5033 Acute on chronic diastolic (congestive) heart failure: Secondary | ICD-10-CM

## 2015-08-28 DIAGNOSIS — I10 Essential (primary) hypertension: Secondary | ICD-10-CM

## 2015-08-28 LAB — BASIC METABOLIC PANEL
BUN: 26 mg/dL — AB (ref 7–25)
CHLORIDE: 103 mmol/L (ref 98–110)
CO2: 25 mmol/L (ref 20–31)
CREATININE: 2.08 mg/dL — AB (ref 0.70–1.18)
Calcium: 9 mg/dL (ref 8.6–10.3)
GLUCOSE: 81 mg/dL (ref 65–99)
Potassium: 3.8 mmol/L (ref 3.5–5.3)
Sodium: 140 mmol/L (ref 135–146)

## 2015-09-01 ENCOUNTER — Telehealth: Payer: Self-pay

## 2015-09-01 ENCOUNTER — Telehealth: Payer: Self-pay | Admitting: Neurology

## 2015-09-01 ENCOUNTER — Telehealth: Payer: Self-pay | Admitting: Cardiovascular Disease

## 2015-09-01 ENCOUNTER — Ambulatory Visit: Payer: Medicare Other | Admitting: Neurology

## 2015-09-01 NOTE — Telephone Encounter (Signed)
The patient has had his second new patient no-show, we will discharge from practice.

## 2015-09-01 NOTE — Telephone Encounter (Signed)
Results of most recent bmet given to patient's wife.  I advised her that Ermalinda Barrios, PA has not reviewed yet and may have additional comments/advice after she reviews tomorrow.  She verbalized understanding and agreement and thanked me for the call.

## 2015-09-01 NOTE — Telephone Encounter (Signed)
Pt no-showed his new patient appt this morning (2nd NP no-show w/ [facility]).

## 2015-09-01 NOTE — Telephone Encounter (Signed)
New message   Pt wife verbalized that she is calling to get her husbands lab results

## 2015-09-02 ENCOUNTER — Encounter: Payer: Self-pay | Admitting: Neurology

## 2015-09-02 ENCOUNTER — Other Ambulatory Visit: Payer: Self-pay | Admitting: Physician Assistant

## 2015-09-18 ENCOUNTER — Ambulatory Visit: Payer: BLUE CROSS/BLUE SHIELD | Admitting: Emergency Medicine

## 2015-10-07 ENCOUNTER — Other Ambulatory Visit: Payer: Self-pay | Admitting: Physician Assistant

## 2015-10-15 ENCOUNTER — Encounter: Payer: Self-pay | Admitting: Cardiovascular Disease

## 2015-10-20 ENCOUNTER — Other Ambulatory Visit: Payer: Self-pay | Admitting: Cardiovascular Disease

## 2015-10-22 ENCOUNTER — Telehealth: Payer: Self-pay | Admitting: Cardiovascular Disease

## 2015-10-22 ENCOUNTER — Ambulatory Visit: Payer: BLUE CROSS/BLUE SHIELD | Admitting: Physician Assistant

## 2015-10-22 NOTE — Progress Notes (Addendum)
Cardiology Office Note    Date:  10/23/2015   ID:  DALAN LAMOTTE, DOB [birth year], MRN [id number]  PCP:   Melinda Crutch, MD  Cardiologist: [name]  Chief Complaint: LE edema  History of Present Illness:   [name] is a 75 y.o. 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  In 04/2014 he was diagnosed with bilateral submassive pulmonary emboli. He was seen by GI given prior history of GI bleeding and an EGD was performed which showed gastric erosions without evidence of active bleeding. PPI therapy was prescribed. He was started on coumadin and bridged with Lovenox.   He continued to have severe dyspnea on exertion. Pulmonary stress testing revealed mild functional impairment suggestive of a circulatory issue. Right and left heart catheterization revealed significant 2 vessel CAD and he underwent PCI/DES to the LAD and PDA 07/2014. He was put on aspirin, Plavix and Coumadin for one month followed by only Plavix and Coumadin. In 03/2015 he was found to be anemic and was admitted for transfusion. His Plavix was discontinued. He sees hematology/oncology and no GI source was identified. 2-D echo 03/2015 EF 60-65% with grade 1 DD and dilated aortic root of 44 mm.  Recently adjusted lasix multiple times for short period of time due to lower extremity edema and dyspnea on exertion.   Last seen in clinic by Gerrianne Scale 08/18/15. Increased lasix to 80mg  BID for 2 days and then continue lasix 80mg  AM and 40mg  PM. His dyspnea felt likely due to COPD and advised to f/u with pulmonary.   The patient has discharged for neurology practice due to no show--> the patient has appointment with different neurologist next month due to memory issue. About 3 weeks ago he was treated with 3 different antibiotics by PCP for sinus infection.  He has recovered.   Seen by GI yesterday 10/22/15 for blood in his stool. Pending labs (unavailable currently).   Here today for LE edema x 3. Currently takes Lasix 40mg  BID. He elevated his legs yesterday and said today his edema has improved. Admits to having abdomen tightness and dyspnea. Denies orthopnea, PND, syncope. Last INR check in coumadin clinic 08/18/15, he missed appointment last month. He is not adding extra salt to his diet. Not weighing himself daily. States that his Scr severely abnormal in past increased lasix short term --> also followed by Dr. Olivia Mackie (nephrologist) at [facility].   Past Medical History:  Diagnosis Date  . Acute lower GI bleeding    a. admitted to [facility] 04/11/2013;  b. 04/2014 EGD: 1.  gastric erosions, mild prox gastritis and bulbar duodenitis->Protonix.  . Addison's disease ([place])   . Anemia   . Anemia in CKD (chronic kidney disease) 06/25/2015  . Chronic back pain   . Chronic diastolic CHF (congestive heart failure) ([place])    a. His initial ejection fraction was between 30 and 35%;  b. 04/2013 Echo: EF 50-55% Gr1 DD, mild-mod MR;  c. 04/2014 Echo: EF 60-65%, Gr 1 DD, triv TR.  . CKD (chronic kidney disease), stage III   . Coronary artery disease    a. 07/2014 - s/p difficult PCI - had pressure wire analysis of LAD s/p DES in mid segment c/b wire-induced dissection in the distal segment of the LAD which was treated with repeated balloon inflations. Also  had DES to rPDA - again had either spasm distal to the stent placement or an edge dissection but the vessel was small in that area; procedure aborted.  . Daily headache   . DVT (deep venous thrombosis) ([place])    a. 02/2013 superficial thrombophlebitis --> Xarelto later d/c'd 2/2 GIB;  b. 04/2014 Acute Right PT, Peroneal, Popliteal, Femoral, and common femoral DVT-->coumadin  . GERD (gastroesophageal reflux disease)   . H/O hiatal hernia   . Hepatitis    a. 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  . History of aortic insufficiency   . Hx of  cardiovascular stress test    a. ETT-Myoview 7/14:  Low risk, inferior defect consistent with thinning, no ischemia, normal wall motion, EF 54%  . Hypertension   . Hypogonadism male   . Obesity   . On home oxygen therapy    a. added 04/2014 in setting of PE.  Marland Kitchen Peptic ulcer disease   . Pulmonary embolism ([place])    a. 04/2014 CTA: Bilat submassive PE ->coumadin.  . Pulmonary HTN ([place])   . Thyroid disease     Past Surgical History:  Procedure Laterality Date  . APPENDECTOMY  1950's  . CARDIAC CATHETERIZATION  06/26/2008   EF 30-35%  . CARDIAC CATHETERIZATION N/A 08/20/2014   Procedure: Right/Left Heart Cath and Coronary Angiography;  Surgeon: Jolaine Artist, MD;  Location: [place] [facility];  Service: Cardiovascular;  Laterality: N/A;  . CARDIAC CATHETERIZATION N/A 08/21/2014   Procedure: Coronary Stent Intervention;  Surgeon: Wellington Hampshire, MD;  Location: [place] [facility];  Service: Cardiovascular;  Laterality: N/A;  . CHOLECYSTECTOMY  ~ 1965  . ESOPHAGOGASTRODUODENOSCOPY N/A 04/11/2013   Procedure: ESOPHAGOGASTRODUODENOSCOPY (EGD);  Surgeon: Missy Sabins, MD;  Location: [facility] ENDOSCOPY;  Service: Endoscopy;  Laterality: N/A;  . ESOPHAGOGASTRODUODENOSCOPY N/A 05/13/2014   Procedure: ESOPHAGOGASTRODUODENOSCOPY (EGD);  Surgeon: Arta Silence, MD;  Location: [facility] ENDOSCOPY;  Service: Endoscopy;  Laterality: N/A;  . TONSILLECTOMY  1940's  . US ECHOCARDIOGRAPHY  04/09/2010   EF 60-65%    Current Medications: Prior to Admission medications   Medication Sig Start Date End Date Taking? Authorizing Provider  atorvastatin (LIPITOR) 80 MG tablet Take 1 tablet (80 mg total) by mouth daily. 10/28/14  Yes Thayer Headings, MD  carvedilol (COREG) 6.25 MG tablet Take 1 tablet (6.25 mg total) by mouth 2 (two) times daily with a meal. 12/05/14  Yes Thayer Headings, MD  Cholecalciferol (VITAMIN D3 PO) Take 1,000 capsules by mouth daily.    Yes Historical Provider, MD  ezetimibe (ZETIA) 10 MG tablet Take  1 tablet (10 mg total) by mouth daily. 02/28/15  Yes Thayer Headings, MD  fenofibrate (TRICOR) 48 MG tablet TAKE 1 TABLET (48 MG TOTAL) BY MOUTH DAILY. 10/20/15  Yes Thayer Headings, MD  ferrous sulfate 325 (65 FE) MG tablet Take 1 tablet (325 mg total) by mouth daily with breakfast. 04/17/15  Yes Florencia Reasons, MD  folic acid (FOLVITE) 1 MG tablet Take 1 tablet (1 mg total) by mouth daily. 04/17/15  Yes Florencia Reasons, MD  furosemide (LASIX) 40 MG tablet Take 1 1/2 tablets by mouth twice a day X's 3 days then go back to 1 tablet by mouth twice a day 10/23/15  Yes Julisa Flippo, PA  gabapentin (NEURONTIN) 600 MG tablet Take 600 mg by mouth daily.   Yes Historical Provider, MD  hydrocortisone (CORTEF) 20 MG tablet Take 20 mg by mouth 3 (three) times daily.    Yes  Historical Provider, MD  isosorbide mononitrate (IMDUR) 30 MG 24 hr tablet Take 0.5 tablets (15 mg total) by mouth daily. 09/05/14  Yes Scott Joylene Draft, PA-C  isosorbide mononitrate (IMDUR) 30 MG 24 hr tablet TAKE 1 TABLET (30 MG TOTAL) BY MOUTH DAILY. 07/02/15  Yes Thayer Headings, MD  isosorbide mononitrate (IMDUR) 30 MG 24 hr tablet TAKE 1 TABLET (30 MG TOTAL) BY MOUTH DAILY. 10/08/15  Yes Thayer Headings, MD  lisinopril (PRINIVIL,ZESTRIL) 10 MG tablet Take 0.5 tablets (5 mg total) by mouth daily. 05/14/15  Yes Thayer Headings, MD  metoCLOPramide (REGLAN) 5 MG tablet Take 5 mg by mouth daily.    Yes Historical Provider, MD  nitroGLYCERIN (NITROSTAT) 0.4 MG SL tablet Place 1 tablet (0.4 mg total) under the tongue every 5 (five) minutes as needed for chest pain (up to 3 doses). 08/22/14  Yes Dayna N Dunn, PA-C  pantoprazole (PROTONIX) 40 MG tablet Take 1 tablet (40 mg total) by mouth daily. 05/13/14  Yes Donne Hazel, MD  potassium chloride SA (K-DUR,KLOR-CON) 20 MEQ tablet TAKE 1 1/2 TABLETS BY MOUTH DAILY X'S 3 DAYS THEN GO BACK TO 1 TABLET BY MOUTH DAILY 10/23/15  Yes Markeisha Mancias, PA  PROAIR HFA 108 (90 Base) MCG/ACT inhaler INHALE 2 PUFFS INTO THE  LUNGS EVERY 6 (SIX) HOURS AS NEEDED FOR WHEEZING OR SHORTNESS OF BREATH. 07/08/15  Yes Collene Gobble, MD  promethazine (PHENERGAN) 25 MG tablet Take 25 mg by mouth every 6 (six) hours as needed for nausea.  05/17/14  Yes Historical Provider, MD  thyroid (ARMOUR) 60 MG tablet Take 60 mg by mouth daily.    Yes Historical Provider, MD  traMADol (ULTRAM) 50 MG tablet Take 100 mg by mouth 3 (three) times daily.    Yes Historical Provider, MD  traZODone (DESYREL) 100 MG tablet Take 100 mg by mouth every evening.  04/16/14  Yes Historical Provider, MD  warfarin (COUMADIN) 3 MG tablet Please take 6mg  on 3/24, then go back to home dose 3mg  every day except Monday and Friday . On Monday and Friday take 1.5mg . Have INR checked on Monday 3/27. 04/18/15  Yes Florencia Reasons, MD    Allergies:   Review of patient's allergies indicates no active allergies.   Social History   Social History  . Marital status: Married    Spouse name: N/A  . Number of children: N/A  . Years of education: N/A   Occupational History  . retired    Social History Main Topics  . Smoking status: Never Smoker  . Smokeless tobacco: Never Used  . Alcohol use No  . Drug use: No  . Sexual activity: Not Asked   Other Topics Concern  . None   Social History Narrative  . None     Family History:  The patient's family history includes Clotting disorder in his brother, brother, brother, mother, and other.   ROS:   Please see the history of present illness.    ROS All other systems reviewed and are negative.   PHYSICAL EXAM:   VS:  BP 140/90   Pulse (!) 102   Ht 5\' 10"  (1.778 m)   Wt 246 lb 1.9 oz (111.6 kg)   SpO2 95%   BMI 35.31 kg/m    GEN: Well nourished, well developed, in no acute distress  HEENT: normal  Neck: no JVD, carotid bruits, or masses Cardiac: RRR; no murmurs, rubs, or gallops, 2-3 + BL LE  edema  L >  R Respiratory:  clear to auscultation bilaterally, normal work of breathing GI: soft, nontender, distended, +  BS MS: no deformity or atrophy  Skin: warm and dry, no rash Neuro:  Alert and Oriented x 3, Strength and sensation are intact Psych: euthymic mood, full affect  Wt Readings from Last 3 Encounters:  10/23/15 246 lb 1.9 oz (111.6 kg)  08/18/15 231 lb 6.4 oz (105 kg)  06/24/15 233 lb 12.8 oz (106.1 kg)      Studies/Labs Reviewed:   EKG:  EKG is not  ordered today.   Recent Labs: 04/15/2015: TSH 1.24 06/04/2015: ALT 18 06/09/2015: Brain Natriuretic Peptide 23.5; Hemoglobin 11.5 06/10/2015: Platelets 378 08/28/2015: BUN 26; Creat 2.08; Potassium 3.8; Sodium 140   Lipid Panel    Component Value Date/Time   CHOL 177 02/27/2015 1256   TRIG 223 (H) 02/27/2015 1256   HDL 41 02/27/2015 1256   CHOLHDL 4.3 02/27/2015 1256   VLDL 45 (H) 02/27/2015 1256   LDLCALC 91 02/27/2015 1256   LDLDIRECT 58 06/04/2015 1450    Additional studies/ records that were reviewed today include:   2D ECHO:04/17/2015 LV EF: 60% -65% Study Conclusions - Left ventricle: The cavity size was normal. Wall thickness was increased in a pattern of moderate LVH. Systolic function was normal. The estimated ejection fraction was in the range of 60% to 65%. Wall motion was normal; there were no regional wall motion abnormalities. Doppler parameters are consistent with abnormal left ventricular relaxation (grade 1 diastolic dysfunction). The E/e&' ratio is between 8-15, suggesting indeterminate LV filling pressure. - Aortic valve: Trileaflet. Sclerosis without stenosis. There was trivial regurgitation. - Aorta: Aortic root dimension: 44 mm (ED). - Aortic root: The aortic root is dilated. - Ascending aorta: The ascending aorta was normal in size. - Mitral valve: Calcified annulus. There was trivial regurgitation. - Left atrium: Moderately dilated at 40 ml/m2. - Tricuspid valve: There was no significant regurgitation. - Inferior vena cava: The vessel was normal in size. The respirophasic  diameter changes were in the normal range (= 50%), consistent with normal central venous pressure. Impressions: - Compared to a prior study in 2016, there are few changes. The aortic root measures 4.4 cm. The ascending aorta measures up to 4 cm at the most distal visualized point.   08/20/14 L/RHC    Procedures   Right/Left Heart Cath and Coronary Angiography    Conclusion    Dist LAD lesion, 80% stenosed.  Mid RCA lesion, 20% stenosed.  RPDA lesion, 95% stenosed.  RA = 6 RV = 28/4/10 PA = 29/15 (21) PCWP = 15 Ao = 148/77 (107) LV = 138/5/18 Fick CO/CI= 7.8/3.6 Thermo CO/CI = 6.5/3.0 PVR < 1 WU Ao sat 93% PA sat = 71%, 74% High SVC sat = 68% RA sat = 69%   Assessment: 1) 2V CAD  2) Normal pulmonary pressures with elevated cardiac output but no evidence of intracardiac shunt 3) Normal EF by echo Plan/Discussion: Despite his obesity and recent PE, his PA pressures are surprisingly normal. Suspect some of his dyspnea related to his body habitus but also has significant 2V CAD and will need PCI. With PE 3 months ago will discuss timing of intervention and possibility of triple-drug therapy with [name] and the interventional team.    08/21/14    Procedures   Coronary Stent Intervention    Conclusion   1. Successful pressure wire interrogation of the LAD with subsequent placement of a drug-eluting stent in the midsegment for an initial  70% stenosis with 0% residual stenosis. This was complicated by a wire-induced dissection in the distal segment of the LAD which was treated with repeated balloon inflations.  2. Successful drug-eluting stent placement to the right PDA. Initial stenosis was 80% which was reduced to 0%. Again, the patient was noted to have either spasm distal to the stent placement or an edge dissection but the vessel was small in that area and given the lack of symptoms and EKG changes I decided to abort the  procedure.  Recommendations: This was overall a difficult procedure due to the mentioned issues. Continue hydration to prevent contrast-induced nephropathy. In terms of anticoagulation, warfarin can be resumed tonight. Heparin can be resumed in 8 hours. For now continue with aspirin and Plavix. Once INR is therapeutic, aspirin can be discontinued. The patient can continue on warfarin and Plavix    ASSESSMENT & PLAN:    1. Bilateral lower extremity edema - He has 2-3+ BL edema L > R up to his knee. Hx of DVT on R side. He does not seems in heart failure ( ? R sided)  given no JVD and clear lungs. However does have distended abdomen. He has not checked his INR in 2 months, will get it done today--> if abnormal might need LE doppler. No other blood work done today as he had it yesterday at GI. His weight is 246lb today, increased 15lb from 08/18/15. Advised to keep daily log and bring to next office visit with [name] (will need BMET at that time with +/- BNP) Also has appointment with nephrologist that week. No salt. Elevate legs and try compression stocking. Increased lasix short term as below.   2. Chronic diastolic CHF - as above. 2-D echo 03/2015 EF 60-65% with grade 1 DD and dilated aortic root of 44 mm.  3. Long term anticoagulation - Last INR check 08/18/15. Will get it done today. Concern for compliance. If long term issue, consider NOAC.   4. Blood in stool - Pending lab work from GI 10/22/15  5. HTN - Relatively stable  6. CAD s/p PCI - No angina  7. CKD, stage III - followed by Dr. Olivia Mackie (nephrologist) at [facility]. Might consider changing diuretics if ongoing issue.   Addendum: INR of 1.6 today. He is compliant with coumadin. Last check as above. Given extra 1.2 table today (total 4.5mg ). No leg pain. Sent staff message to Dr. Cathie Olden regarding need of LE doppler. However tx would be same.   Medication Adjustments/Labs and Tests Ordered: Current medicines are reviewed  at length with the patient today.  Concerns regarding medicines are outlined above.  Medication changes, Labs and Tests ordered today are listed in the Patient Instructions below. Patient Instructions  Medication Instructions:  Your physician has recommended you make the following change in your medication:  1.  INCREASE the Lasix to 40 mg taking 1 1/2 tablets twice a day X's 3 days then back to 1 tablet twice a day 2. INCREASE the Potassium 20 mg taking 1 1/2 tablets daily X's 3 days then go back to 1 tablet daily  Labwork: None ordered  Testing/Procedures: None ordered  Follow-Up: Your physician recommends that you schedule a follow-up appointment in: SEE DR. [name] AS PLANNED   Any Other Special Instructions Will Be Listed Below (If Applicable).  Your physician recommends that you weigh, daily, at the same time every day, and in the same amount of clothing. Please record your daily weights on the handout provided and  bring it to your next appointment.    If you need a refill on your cardiac medications before your next appointment, please call your pharmacy.      Jarrett Soho, [place]  10/23/2015 8:58 AM    [facility] [address], [place], [place]  [address] Phone: [phone]; Fax: [phone]

## 2015-10-22 NOTE — Telephone Encounter (Signed)
New message    Pts wife calling about swelling in feet, ankles, legs and she believe he is bleeding internally.   Pt c/o swelling: STAT is pt has developed SOB within 24 hours  1. How long have you been experiencing swelling? yesterday  2. Where is the swelling located? Feet, ankles and legs  3.  Are you currently taking a "fluid pill"? yes  4.  Are you currently SOB? SOB  5.  Have you traveled recently? no

## 2015-10-22 NOTE — Telephone Encounter (Signed)
[name] wife states pt has had increase in LE edema the past couple of days.  [name] wife states pt has been taking his regular dose of lasix 40mg  bid.  [name] wife states pt does not weigh regularly, she does not know his heart rate or BP. [name] wife states pt is more short of breath and has some abdominal distention. [name] wife states pt is having upper abdominal pain and noticed dark blood in pt's stool this morning. [name] wife called pt's GI doctor, Dr Amedeo Plenty and  has an appt today at 82 AM.   [name] wife requesting appt to have LE edema evaluated.  [name] wife advised pt has been scheduled to see Nell Range, PA today at 2 PM.

## 2015-10-22 NOTE — Progress Notes (Deleted)
Cardiology Office Note    Date:  10/22/2015   ID:  [name], DOB September 12, 1940, MRN [id number]  PCP:   Melinda Crutch, MD  Cardiologist:  [name]   LE edema   History of Present Illness:  [name] is a 75 y.o. 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  In 04/2014 he was diagnosed with bilateral submassive pulmonary emboli. He was seen by GI given prior history of GI bleeding and an EGD was performed which showed gastric erosions without evidence of active bleeding. PPI therapy was prescribed. He was started on coumadin and bridged with Lovenox.   He continued to have severe DOE. A cardiopulmonary stress test revealed mild functional impairment suggestive of a circulatory issue. He was set up for left and right heart catheterization which revealed significant 2V CAD. He underwent PCI/DES to dLAD and rPDA in 07/2014. He was put on ASA, plavix and coumadin for 1 month followed by only plavix and coumadin.   In 03/2015 he was seen in the office for fatige and SOB. He was found to be anemic and admitted for transfusion. He received one unit of blood and also a transfusion of iron.Stool quiac was negative  His plavix was discontinued.  He was scheduled to see a hemotologist / oncologist since there was no evidence of GI source. 2D ECHO showed EF 60%-65%, G1DD and dilated aortic root at 44 mm.   He was seen by [name] for post hospital follow up in 04/30/2015. He felt his volume status was stable. Weight was 223lbs.  I saw him in clinic on 06/04/15 weight gain and fluid retention. He was up 10 lbs from previous (233 lbs on our scale.) I increased his last from 20mg  daily to 40mg  BID. He saw Truitt Merle NP for close follow up on 06/09/15. He had only lost 2 lbs and still felt dyspneic. Creat was  stable ~2. She kept him on lasix 40mg  BID.   I saw him in clinic in 06/24/15 and he wasn't feeling better. Still SOB and weight still up (weight 233). He was not having good UOP with lasix 40mg  BID so increased Lasix to 80 mg BID and KDur 64meQ BID. He was supposed to see me for close follow up but his brother died and he cancelled. He went back to lasix 40mg  BID and then PCP changed him to 80mg  in AM and 40 mg in PM. He saw Estella Husk PA-C in the clinic on 08/18/15 for continued SOB. She felt most of his sx were pulmonary and asked him to see pulmonology.  Today he was added on to my clinic schedule for evaluation of LE edema.  He also complained of abdominal pain and distension as well as dark blood in his stools.     Past Medical History:  Diagnosis Date  . Acute lower GI bleeding    a. admitted to [facility] 04/11/2013;  b. 04/2014 EGD: 1.  gastric erosions, mild prox gastritis and bulbar duodenitis->Protonix.  . Addison's disease ([place])   . Anemia   . Anemia in CKD (chronic kidney disease) 06/25/2015  . Chronic back pain   . Chronic diastolic CHF (congestive heart failure) ([place])    a. His initial ejection fraction was between 30 and 35%;  b. 04/2013 Echo: EF  50-55% Gr1 DD, mild-mod MR;  c. 04/2014 Echo: EF 60-65%, Gr 1 DD, triv TR.  . CKD (chronic kidney disease), stage III   . Coronary artery disease    a. 07/2014 - s/p difficult PCI - had pressure wire analysis of LAD s/p DES in mid segment c/b wire-induced dissection in the distal segment of the LAD which was treated with repeated balloon inflations. Also had DES to rPDA - again had either spasm distal to the stent placement or an edge dissection but the vessel was small in that area; procedure aborted.  . Daily headache   . DVT (deep venous thrombosis) ([place])    a. 02/2013 superficial thrombophlebitis --> Xarelto later d/c'd 2/2 GIB;  b. 04/2014 Acute Right PT, Peroneal, Popliteal, Femoral, and common femoral DVT-->coumadin  . GERD (gastroesophageal  reflux disease)   . H/O hiatal hernia   . Hepatitis    a. 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  . History of aortic insufficiency   . Hx of cardiovascular stress test    a. ETT-Myoview 7/14:  Low risk, inferior defect consistent with thinning, no ischemia, normal wall motion, EF 54%  . Hypertension   . Hypogonadism male   . Obesity   . On home oxygen therapy    a. added 04/2014 in setting of PE.  Marland Kitchen Peptic ulcer disease   . Pulmonary embolism ([place])    a. 04/2014 CTA: Bilat submassive PE ->coumadin.  . Pulmonary HTN ([place])   . Thyroid disease     Past Surgical History:  Procedure Laterality Date  . APPENDECTOMY  1950's  . CARDIAC CATHETERIZATION  06/26/2008   EF 30-35%  . CARDIAC CATHETERIZATION N/A 08/20/2014   Procedure: Right/Left Heart Cath and Coronary Angiography;  Surgeon: Jolaine Artist, MD;  Location: [facility];  Service: Cardiovascular;  Laterality: N/A;  . CARDIAC CATHETERIZATION N/A 08/21/2014   Procedure: Coronary Stent Intervention;  Surgeon: Wellington Hampshire, MD;  Location: [facility];  Service: Cardiovascular;  Laterality: N/A;  . CHOLECYSTECTOMY  ~ 1965  . ESOPHAGOGASTRODUODENOSCOPY N/A 04/11/2013   Procedure: ESOPHAGOGASTRODUODENOSCOPY (EGD);  Surgeon: Missy Sabins, MD;  Location: [facility] ENDOSCOPY;  Service: Endoscopy;  Laterality: N/A;  . ESOPHAGOGASTRODUODENOSCOPY N/A 05/13/2014   Procedure: ESOPHAGOGASTRODUODENOSCOPY (EGD);  Surgeon: Arta Silence, MD;  Location: [facility] ENDOSCOPY;  Service: Endoscopy;  Laterality: N/A;  . TONSILLECTOMY  1940's  . US ECHOCARDIOGRAPHY  04/09/2010   EF 60-65%    Current Medications: Outpatient Medications Prior to Visit  Medication Sig Dispense Refill  . atorvastatin (LIPITOR) 80 MG tablet Take 1 tablet (80 mg total) by mouth daily. 90 tablet 3  . carvedilol (COREG) 6.25 MG tablet Take 1 tablet (6.25 mg total) by mouth 2 (two) times daily with a meal. 180 tablet 3  . Cholecalciferol (VITAMIN D3 PO) Take 1,000 capsules by mouth daily.     Marland Kitchen  ezetimibe (ZETIA) 10 MG tablet Take 1 tablet (10 mg total) by mouth daily. 30 tablet 11  . fenofibrate (TRICOR) 48 MG tablet TAKE 1 TABLET (48 MG TOTAL) BY MOUTH DAILY. 30 tablet 9  . ferrous sulfate 325 (65 FE) MG tablet Take 1 tablet (325 mg total) by mouth daily with breakfast. 30 tablet 0  . folic acid (FOLVITE) 1 MG tablet Take 1 tablet (1 mg total) by mouth daily. 30 tablet 0  . furosemide (LASIX) 40 MG tablet TAKE 1 TABLET (40 MG TOTAL) BY MOUTH 2 (TWO) TIMES DAILY. 60 tablet 9  . gabapentin (NEURONTIN) 600  MG tablet Take 600 mg by mouth daily.    . hydrocortisone (CORTEF) 20 MG tablet Take 20 mg by mouth 3 (three) times daily.     . isosorbide mononitrate (IMDUR) 30 MG 24 hr tablet Take 0.5 tablets (15 mg total) by mouth daily.    . isosorbide mononitrate (IMDUR) 30 MG 24 hr tablet TAKE 1 TABLET (30 MG TOTAL) BY MOUTH DAILY. 30 tablet 0  . isosorbide mononitrate (IMDUR) 30 MG 24 hr tablet TAKE 1 TABLET (30 MG TOTAL) BY MOUTH DAILY. 30 tablet 5  . lisinopril (PRINIVIL,ZESTRIL) 10 MG tablet Take 0.5 tablets (5 mg total) by mouth daily. 7 tablet 0  . metoCLOPramide (REGLAN) 5 MG tablet Take 5 mg by mouth daily.     . nitroGLYCERIN (NITROSTAT) 0.4 MG SL tablet Place 1 tablet (0.4 mg total) under the tongue every 5 (five) minutes as needed for chest pain (up to 3 doses). 25 tablet 3  . pantoprazole (PROTONIX) 40 MG tablet Take 1 tablet (40 mg total) by mouth daily. 30 tablet 0  . potassium chloride SA (K-DUR,KLOR-CON) 20 MEQ tablet TAKE 2 TABLETS BY MOUTH DAILY X'S 3 DAYS THEN GO BACK TO 1 TABLET BY MOUTH DAILY 90 tablet 3  . PROAIR HFA 108 (90 Base) MCG/ACT inhaler INHALE 2 PUFFS INTO THE LUNGS EVERY 6 (SIX) HOURS AS NEEDED FOR WHEEZING OR SHORTNESS OF BREATH. 8.5 Inhaler 5  . promethazine (PHENERGAN) 25 MG tablet Take 25 mg by mouth every 6 (six) hours as needed for nausea.     Marland Kitchen thyroid (ARMOUR) 60 MG tablet Take 60 mg by mouth daily.     . traMADol (ULTRAM) 50 MG tablet Take 100 mg by mouth  3 (three) times daily.     . traZODone (DESYREL) 100 MG tablet Take 100 mg by mouth every evening.   2  . warfarin (COUMADIN) 3 MG tablet Please take 6mg  on 3/24, then go back to home dose 3mg  every day except Monday and Friday . On Monday and Friday take 1.5mg . Have INR checked on Monday 3/27. 30 tablet 3   No facility-administered medications prior to visit.      Allergies:   Review of patient's allergies indicates no active allergies.   Social History   Social History  . Marital status: Married    Spouse name: N/A  . Number of children: N/A  . Years of education: N/A   Occupational History  . retired    Social History Main Topics  . Smoking status: Never Smoker  . Smokeless tobacco: Never Used  . Alcohol use No  . Drug use: No  . Sexual activity: Not on file   Other Topics Concern  . Not on file   Social History Narrative  . No narrative on file     Family History:  The patient's family history includes Clotting disorder in his brother, brother, brother, mother, and other.   ROS:   Please see the history of present illness.    ROS All other systems reviewed and are negative.   PHYSICAL EXAM:   VS:  There were no vitals taken for this visit.   GEN: Well nourished, well developed, in no acute distress  HEENT: normal  Neck: no JVD, carotid bruits, or masses Cardiac: RRR; no murmurs, rubs, or gallops, Trace bilateral LE edema to knees. + JVD Respiratory: normal breath sounds.  normal work of breathing GI: soft, nontender, nondistended, + BS MS: no deformity or atrophy  Skin: warm and dry,  no rash Neuro:  Alert and Oriented x 3, Strength and sensation are intact Psych: euthymic mood, full affect  Wt Readings from Last 3 Encounters:  08/18/15 231 lb 6.4 oz (105 kg)  06/24/15 233 lb 12.8 oz (106.1 kg)  06/10/15 228 lb (103.4 kg)      Studies/Labs Reviewed:   EKG:  EKG is NOT ordered today.    Recent Labs: 04/15/2015: TSH 1.24 06/04/2015: ALT 18 06/09/2015:  Brain Natriuretic Peptide 23.5; Hemoglobin 11.5 06/10/2015: Platelets 378 08/28/2015: BUN 26; Creat 2.08; Potassium 3.8; Sodium 140   Lipid Panel    Component Value Date/Time   CHOL 177 02/27/2015 1256   TRIG 223 (H) 02/27/2015 1256   HDL 41 02/27/2015 1256   CHOLHDL 4.3 02/27/2015 1256   VLDL 45 (H) 02/27/2015 1256   LDLCALC 91 02/27/2015 1256   LDLDIRECT 58 06/04/2015 1450    Additional studies/ records that were reviewed today include:    2D ECHO: 04/17/2015 LV EF: 60% - 65% Study Conclusions - Left ventricle: The cavity size was normal. Wall thickness was  increased in a pattern of moderate LVH. Systolic function was  normal. The estimated ejection fraction was in the range of 60%  to 65%. Wall motion was normal; there were no regional wall  motion abnormalities. Doppler parameters are consistent with  abnormal left ventricular relaxation (grade 1 diastolic  dysfunction). The E/e&' ratio is between 8-15, suggesting  indeterminate LV filling pressure. - Aortic valve: Trileaflet. Sclerosis without stenosis. There was  trivial regurgitation. - Aorta: Aortic root dimension: 44 mm (ED). - Aortic root: The aortic root is dilated. - Ascending aorta: The ascending aorta was normal in size. - Mitral valve: Calcified annulus. There was trivial regurgitation. - Left atrium: Moderately dilated at 40 ml/m2. - Tricuspid valve: There was no significant regurgitation. - Inferior vena cava: The vessel was normal in size. The  respirophasic diameter changes were in the normal range (= 50%),  consistent with normal central venous pressure. Impressions: - Compared to a prior study in 2016, there are few changes. The  aortic root measures 4.4 cm. The ascending aorta measures up to 4  cm at the most distal visualized point.   08/20/14 L/[facility] Procedures    Right/Left Heart Cath and Coronary Angiography    Conclusion     Dist LAD lesion, 80% stenosed.  Mid RCA lesion,  20% stenosed.  RPDA lesion, 95% stenosed.  RA = 6 RV = 28/4/10 PA = 29/15 (21) PCWP = 15 Ao = 148/77 (107) LV = 138/5/18 Fick CO/CI = 7.8/3.6 Thermo CO/CI = 6.5/3.0 PVR < 1 WU Ao sat 93% PA sat = 71%, 74% High SVC sat = 68% RA sat = 69%   Assessment: 1) 2V CAD  2) Normal pulmonary pressures with elevated cardiac output but no evidence of intracardiac shunt 3) Normal EF by echo Plan/Discussion: Despite his obesity and recent PE, his PA pressures are surprisingly normal. Suspect some of his dyspnea related to his body habitus but also has significant 2V CAD and will need PCI. With PE 3 months ago will discuss timing of intervention and possibility of triple-drug therapy with [name] and the interventional team.    08/21/14 Procedures    Coronary Stent Intervention    Conclusion    1. Successful pressure wire interrogation of the LAD with subsequent placement of a drug-eluting stent in the midsegment for an initial 70% stenosis with 0% residual stenosis. This was complicated by a wire-induced dissection in the  distal segment of the LAD which was treated with repeated balloon inflations.  2. Successful drug-eluting stent placement to the right PDA. Initial stenosis was 80% which was reduced to 0%. Again, the patient was noted to have either spasm distal to the stent placement or an edge dissection but the vessel was small in that area and given the lack of symptoms and EKG changes I decided to abort the procedure.  Recommendations: This was overall a difficult procedure due to the mentioned issues. Continue hydration to prevent contrast-induced nephropathy. In terms of anticoagulation, warfarin can be resumed tonight. Heparin can be resumed in 8 hours. For now continue with aspirin and Plavix. Once INR is therapeutic, aspirin can be discontinued. The patient can continue on warfarin and Plavix.     ASSESSMENT & PLAN:  In order of problems listed above:  Acute on  chronic diastolic CHF:  Has been on 40mg  BID and Kdur 59mEq BID x 3 weeks. Weight has not budged on our scale- still 233 lbs despite increased lasix. He has not a good urine output on increased Lasix. He is still SOB. With this CKD I don't think that I gave him enough Lasix. Will increase Lasix to 80 mg BID and KDur 61meQ BID and see how he does. BMET today. I will see him back in the clinic next week for close follow up.   CAD s/p DES to dLAD and rPDA (02/2014): continue plavix. No ASA due to coumadin. Continue BB and statin.   HTN: BP High today, but wife says that it's been running good at home. Will not change medications today.  HLD: continue statin, Zetia and fenofibrate   CKD: creat baseline ~2. Check BMET today   Anemia of chronic disease: admitted 03/2015 for transfusion of blood and iron. Followed by Dr. Beryle Beams for evaluation since no GI source noted.   Hx of DVT/bilateral PE: continue coumadin  Medication Adjustments/Labs and Tests Ordered: Current medicines are reviewed at length with the patient today.  Concerns regarding medicines are outlined above.  Medication changes, Labs and Tests ordered today are listed in the Patient Instructions below. There are no Patient Instructions on file for this visit.   Signed, Angelena Form, PA-C  10/22/2015 9:40 AM    [facility] [address], [place], [place]  [address] Phone: [phone]; Fax: [phone]

## 2015-10-23 ENCOUNTER — Encounter: Payer: Self-pay | Admitting: Physician Assistant

## 2015-10-23 ENCOUNTER — Ambulatory Visit (INDEPENDENT_AMBULATORY_CARE_PROVIDER_SITE_OTHER): Payer: BLUE CROSS/BLUE SHIELD | Admitting: *Deleted

## 2015-10-23 ENCOUNTER — Ambulatory Visit (INDEPENDENT_AMBULATORY_CARE_PROVIDER_SITE_OTHER): Payer: BLUE CROSS/BLUE SHIELD | Admitting: Physician Assistant

## 2015-10-23 VITALS — BP 140/90 | HR 102 | Ht 70.0 in | Wt 246.1 lb

## 2015-10-23 DIAGNOSIS — N183 Chronic kidney disease, stage 3 unspecified: Secondary | ICD-10-CM

## 2015-10-23 DIAGNOSIS — R600 Localized edema: Secondary | ICD-10-CM

## 2015-10-23 DIAGNOSIS — I82401 Acute embolism and thrombosis of unspecified deep veins of right lower extremity: Secondary | ICD-10-CM

## 2015-10-23 DIAGNOSIS — Z86711 Personal history of pulmonary embolism: Secondary | ICD-10-CM

## 2015-10-23 DIAGNOSIS — Z7901 Long term (current) use of anticoagulants: Secondary | ICD-10-CM

## 2015-10-23 DIAGNOSIS — I2510 Atherosclerotic heart disease of native coronary artery without angina pectoris: Secondary | ICD-10-CM

## 2015-10-23 DIAGNOSIS — I5032 Chronic diastolic (congestive) heart failure: Secondary | ICD-10-CM

## 2015-10-23 DIAGNOSIS — I2782 Chronic pulmonary embolism: Secondary | ICD-10-CM | POA: Diagnosis not present

## 2015-10-23 DIAGNOSIS — Z5181 Encounter for therapeutic drug level monitoring: Secondary | ICD-10-CM | POA: Diagnosis not present

## 2015-10-23 DIAGNOSIS — Z9861 Coronary angioplasty status: Secondary | ICD-10-CM

## 2015-10-23 LAB — POCT INR: INR: 1.6

## 2015-10-23 MED ORDER — POTASSIUM CHLORIDE CRYS ER 20 MEQ PO TBCR: EXTENDED_RELEASE_TABLET | ORAL | 3 refills | Status: DC

## 2015-10-23 MED ORDER — FUROSEMIDE 40 MG PO TABS: ORAL_TABLET | ORAL | 9 refills | Status: DC

## 2015-10-23 NOTE — Patient Instructions (Addendum)
Medication Instructions:  Your physician has recommended you make the following change in your medication:  1.  INCREASE the Lasix to 40 mg taking 1 1/2 tablets twice a day X's 3 days then back to 1 tablet twice a day 2. INCREASE the Potassium 20 mg taking 1 1/2 tablets daily X's 3 days then go back to 1 tablet daily  Labwork: None ordered  Testing/Procedures: None ordered  Follow-Up: Your physician recommends that you schedule a follow-up appointment in: SEE DR. [name] AS PLANNED   Any Other Special Instructions Will Be Listed Below (If Applicable).  Your physician recommends that you weigh, daily, at the same time every day, and in the same amount of clothing. Please record your daily weights on the handout provided and bring it to your next appointment.    If you need a refill on your cardiac medications before your next appointment, please call your pharmacy.

## 2015-10-29 ENCOUNTER — Ambulatory Visit (INDEPENDENT_AMBULATORY_CARE_PROVIDER_SITE_OTHER): Payer: BLUE CROSS/BLUE SHIELD | Admitting: Cardiovascular Disease

## 2015-10-29 ENCOUNTER — Other Ambulatory Visit: Payer: Self-pay | Admitting: Physician Assistant

## 2015-10-29 ENCOUNTER — Encounter: Payer: Self-pay | Admitting: Cardiovascular Disease

## 2015-10-29 VITALS — BP 122/84 | HR 97 | Ht 70.0 in | Wt 244.4 lb

## 2015-10-29 DIAGNOSIS — I10 Essential (primary) hypertension: Secondary | ICD-10-CM

## 2015-10-29 DIAGNOSIS — I2510 Atherosclerotic heart disease of native coronary artery without angina pectoris: Secondary | ICD-10-CM | POA: Diagnosis not present

## 2015-10-29 DIAGNOSIS — I5032 Chronic diastolic (congestive) heart failure: Secondary | ICD-10-CM | POA: Diagnosis not present

## 2015-10-29 LAB — BASIC METABOLIC PANEL
BUN: 32 mg/dL — ABNORMAL HIGH (ref 7–25)
CALCIUM: 9.3 mg/dL (ref 8.6–10.3)
CO2: 25 mmol/L (ref 20–31)
Chloride: 105 mmol/L (ref 98–110)
Creat: 2.13 mg/dL — ABNORMAL HIGH (ref 0.70–1.18)
Glucose, Bld: 96 mg/dL (ref 65–99)
Potassium: 4.4 mmol/L (ref 3.5–5.3)
SODIUM: 142 mmol/L (ref 135–146)

## 2015-10-29 NOTE — Progress Notes (Signed)
Patient Name: [name] Date of Encounter: 10/29/2015  Primary Care Provider:   Melinda Crutch, MD Primary Cardiologist:  Joaquim Nam, MD   Problem List 1. CAD - s/p DES to distal LAD and R  PDA  2. Pulmonary Embolus  3.    Chief Complaint  75 year old male who presents for hospital follow-up related to recent admission for bilateral PE and right lower extremity DVT.  He was seen by [name], NP in April, 2016 - note is as below  75 year old male with the above complex problem list. I last saw him in clinic about 2 weeks ago, at which time he reported a 4 week history of dyspnea and lower extremity edema, which was initially treated as an upper respiratory infection. He did have evidence of volume overload on exam and we increased his Lasix. He was seen 3 days later secondary to ongoing dyspnea despite diuresis. D-dimer was drawn and was elevated at 4. He was referred to the hospital for CT angiography. This was performed early on April 16 and revealed bilateral submassive pulmonary emboli. He was admitted to medicine and placed on heparin. Radical care in interventional radiology were consulted and did not feel that he met criteria for TPA. He was hemodynamically stable throughout his hospitalization and converted to Coumadin. Prior to this, he was seen by GI given prior history of GI bleeding and an EGD was performed on April 18 and showed gastric erosions without evidence of active bleeding. PPI therapy was prescribed.  Since his discharge, he has continued to have dyspnea with minimal exertion. He has been on Lovenox bridging and finish his last Lovenox injection yesterday morning. He has not yet had her INR checked. He is not having chest pain, palpitations, PND, orthopnea, dizziness, syncope, edema, or early satiety. Previous symptoms of volume overload including increasing abdominal girth and edema have more or less resolved.   August 01, 2014:    [name] returns for evaluation of  his chronic shortness of breath and leg edema. The cardiopulmonary stress test revealed mild functional impairment suggestive of a circulatory issue. He returns today to discuss right left heart catheterization.  Still has shortness of breath. Unable to do anything without gasping for air - doing ADLs cause significant dyspnea . Has had progressive DOE over the past month or so    Sept. 19, 2016:  [name] had PCI of the mid - distal LAD a month ago. mid to distal LAD.  2.75 x 18 mm resolute integrity drug-eluting stent which was deployed to 14 atm. This was postdilated with a 3.0 x 15 mm noncompliant balloon. His right-sided pressures were completely normal.  Complains of some mild swelling of his right ankle. Thinks that he is able to do more exercise and recover quicker.  Breathing is ok   Nov. 8, 2016: No CP , Still short of breath , seems to be getting a little worse, similar to prior to his stenting .  Getting some exercise  April 30, 2015:  Has had a month of more fatigue, shortness of breath .  Was seen by Kerin Ransom,   Was found to to be anemic/ Was admitted to the hospital on March 22. He received one unit of blood and also a transfusion of iron.   Stool quiac was negative .  He seen his general medical doctor but has not seen a gastroenterologist.   Is scheduled to see a hemotologist / oncologist.   His home health nurse increased his  Lasix to 20 mg a day. His wife thought that he was becoming very volume depleted. He has a severe lack of energy at this point. Had blood work at his primary  MD office  Melinda Crutch, MD )   Oct. 4, 2017:  [name] is seen here for follow-up visit. He has a history of coronary artery disease and status post PTCA and stenting last  Year.   Hx of sub massive PE. Say Vin last week for leg edema  His lasix was increased slightly  - weight has decreased slightly  Breathing is still short.    Past Medical History   Past Medical History:    Diagnosis Date  . Acute lower GI bleeding    a. admitted to [facility] 04/11/2013;  b. 04/2014 EGD: 1.  gastric erosions, mild prox gastritis and bulbar duodenitis->Protonix.  . Addison's disease ([facility])   . Anemia   . Anemia in CKD (chronic kidney disease) 06/25/2015  . Chronic back pain   . Chronic diastolic CHF (congestive heart failure) ([place])    a. His initial ejection fraction was between 30 and 35%;  b. 04/2013 Echo: EF 50-55% Gr1 DD, mild-mod MR;  c. 04/2014 Echo: EF 60-65%, Gr 1 DD, triv TR.  . CKD (chronic kidney disease), stage III   . Coronary artery disease    a. 07/2014 - s/p difficult PCI - had pressure wire analysis of LAD s/p DES in mid segment c/b wire-induced dissection in the distal segment of the LAD which was treated with repeated balloon inflations. Also had DES to rPDA - again had either spasm distal to the stent placement or an edge dissection but the vessel was small in that area; procedure aborted.  . Daily headache   . DVT (deep venous thrombosis) ([place])    a. 02/2013 superficial thrombophlebitis --> Xarelto later d/c'd 2/2 GIB;  b. 04/2014 Acute Right PT, Peroneal, Popliteal, Femoral, and common femoral DVT-->coumadin  . GERD (gastroesophageal reflux disease)   . H/O hiatal hernia   . Hepatitis    a. 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  . History of aortic insufficiency   . Hx of cardiovascular stress test    a. ETT-Myoview 7/14:  Low risk, inferior defect consistent with thinning, no ischemia, normal wall motion, EF 54%  . Hypertension   . Hypogonadism male   . Obesity   . On home oxygen therapy    a. added 04/2014 in setting of PE.  Marland Kitchen Peptic ulcer disease   . Pulmonary embolism ([place])    a. 04/2014 CTA: Bilat submassive PE ->coumadin.  . Pulmonary HTN   . Thyroid disease    Past Surgical History:  Procedure Laterality Date  . APPENDECTOMY  1950's  . CARDIAC CATHETERIZATION  06/26/2008   EF 30-35%  . CARDIAC CATHETERIZATION N/A 08/20/2014   Procedure: Right/Left Heart Cath and Coronary  Angiography;  Surgeon: Jolaine Artist, MD;  Location: [place] CV LAB;  Service: Cardiovascular;  Laterality: N/A;  . CARDIAC CATHETERIZATION N/A 08/21/2014   Procedure: Coronary Stent Intervention;  Surgeon: Wellington Hampshire, MD;  Location: [place] CV LAB;  Service: Cardiovascular;  Laterality: N/A;  . CHOLECYSTECTOMY  ~ 1965  . ESOPHAGOGASTRODUODENOSCOPY N/A 04/11/2013   Procedure: ESOPHAGOGASTRODUODENOSCOPY (EGD);  Surgeon: Missy Sabins, MD;  Location: [facility] ENDOSCOPY;  Service: Endoscopy;  Laterality: N/A;  . ESOPHAGOGASTRODUODENOSCOPY N/A 05/13/2014   Procedure: ESOPHAGOGASTRODUODENOSCOPY (EGD);  Surgeon: Arta Silence, MD;  Location: [facility] ENDOSCOPY;  Service: Endoscopy;  Laterality: N/A;  .  TONSILLECTOMY  1940's  . US ECHOCARDIOGRAPHY  04/09/2010   EF 60-65%    Allergies  No Active Allergies     Home Medications  Current Outpatient Prescriptions  Medication Sig Dispense Refill  . atorvastatin (LIPITOR) 80 MG tablet Take 1 tablet (80 mg total) by mouth daily. 90 tablet 3  . carvedilol (COREG) 6.25 MG tablet Take 1 tablet (6.25 mg total) by mouth 2 (two) times daily with a meal. 180 tablet 3  . Cholecalciferol (VITAMIN D3 PO) Take 1,000 capsules by mouth daily.     Marland Kitchen ezetimibe (ZETIA) 10 MG tablet Take 1 tablet (10 mg total) by mouth daily. 30 tablet 11  . fenofibrate (TRICOR) 48 MG tablet TAKE 1 TABLET (48 MG TOTAL) BY MOUTH DAILY. 30 tablet 9  . ferrous sulfate 325 (65 FE) MG tablet Take 1 tablet (325 mg total) by mouth daily with breakfast. 30 tablet 0  . folic acid (FOLVITE) 1 MG tablet Take 1 tablet (1 mg total) by mouth daily. 30 tablet 0  . furosemide (LASIX) 40 MG tablet Take 1 1/2 tablets by mouth twice a day X's 3 days then go back to 1 tablet by mouth twice a day 80 tablet 9  . gabapentin (NEURONTIN) 600 MG tablet Take 600 mg by mouth daily.    . hydrocortisone (CORTEF) 20 MG tablet Take 20 mg by mouth 3 (three) times daily.     . isosorbide mononitrate (IMDUR) 30  MG 24 hr tablet Take 0.5 tablets (15 mg total) by mouth daily.    Marland Kitchen lisinopril (PRINIVIL,ZESTRIL) 10 MG tablet Take 0.5 tablets (5 mg total) by mouth daily. 7 tablet 0  . metoCLOPramide (REGLAN) 5 MG tablet Take 5 mg by mouth daily.     . nitroGLYCERIN (NITROSTAT) 0.4 MG SL tablet Place 1 tablet (0.4 mg total) under the tongue every 5 (five) minutes as needed for chest pain (up to 3 doses). 25 tablet 3  . pantoprazole (PROTONIX) 40 MG tablet Take 1 tablet (40 mg total) by mouth daily. 30 tablet 0  . potassium chloride SA (K-DUR,KLOR-CON) 20 MEQ tablet TAKE 1 1/2 TABLETS BY MOUTH DAILY X'S 3 DAYS THEN GO BACK TO 1 TABLET BY MOUTH DAILY 90 tablet 3  . PROAIR HFA 108 (90 Base) MCG/ACT inhaler INHALE 2 PUFFS INTO THE LUNGS EVERY 6 (SIX) HOURS AS NEEDED FOR WHEEZING OR SHORTNESS OF BREATH. 8.5 Inhaler 5  . promethazine (PHENERGAN) 25 MG tablet Take 25 mg by mouth every 6 (six) hours as needed for nausea.     Marland Kitchen thyroid (ARMOUR) 60 MG tablet Take 60 mg by mouth daily.     . traMADol (ULTRAM) 50 MG tablet Take 100 mg by mouth 3 (three) times daily.     . traZODone (DESYREL) 100 MG tablet Take 100 mg by mouth every evening.   2  . warfarin (COUMADIN) 3 MG tablet Please take 4m on 3/24, then go back to home dose 366mevery day except Monday and Friday . On Monday and Friday take 1.2m40mHave INR checked on Monday 3/27. 30 tablet 3   No current facility-administered medications for this visit.  Review of Systems  As above, he continues to experience DOE.  He denies chest pain, palpitations, pnd, orthopnea, n, v, dizziness, syncope, edema, weight gain, or early satiety. All other systems reviewed and are otherwise negative except as noted above.  Physical Exam  VS:  BP 122/84   Pulse 97   Ht 5' 10"  (1.778 m)   Wt 244 lb 6.4 oz (110.9 kg)   SpO2 97%   BMI 35.07 kg/m  ,  BMI Body mass index is 35.07 kg/m. GEN: Well nourished, well developed, in no acute distress.  HEENT: normal.  Neck: Supple, no JVD, carotid bruits, or masses. Cardiac: RRR, no murmurs, rubs, or gallops. No clubbing, cyanosis. n 1+ bilat LE edema.  Radials/DP/PT 2+ and equal bilaterally.  His posterior tibialis pulses is 2+ bilateral  Respiratory:  Respirations regular and unlabored, clear to auscultation bilaterally. GI: Soft, nontender, nondistended, BS + x 4. MS: no deformity or atrophy. Skin: warm and dry, no rash. Neuro:  Strength and sensation are intact. Psych: Normal affect.   Assessment & Plan  1.  Bilateral pulmonary emboli/right lower extremity DVT:    On warfarin,    2. Chronic diastolic congestive heart failure: Volume appears stable today. Heart rate and blood pressure stable. Continue Lasix at current dose. spirinolactone was stopped by his nephrologist.  He hs continued to have severe dyspnea. Has seen Dr. Haroldine Laws in the past .   I think he would benefit from continued follow up in the advanced CHF clinic.   3. Hypertension: Stable on current regimen including blocker, ACE inhibitor, and calcium channel blocker therapy.     4. CAD - s/p PCI .   No angina .  He continues to have significant DOE.  No real angina   5.  Peripheral neuropathy:  He has excellent distal pulses in his feet. The burning that he is having in his feet and toes are  not due to peripheral arterial disease. 6. Anemia:   No evidence of GI source.   Has an appt with Hematology soon  Mertie Moores, MD  10/29/2015 10:44 AM    [address],  [address], [place]  [address] Pager [phone] Phone: [phone]; Fax: [phone]

## 2015-10-29 NOTE — Patient Instructions (Signed)
Medication Instructions:  Your physician recommends that you continue on your current medications as directed. Please refer to the Current Medication list given to you today.   Labwork: TODAY - basic metabolic panel   Testing/Procedures: None Ordered   Follow-Up: Your physician recommends that you schedule a follow-up appointment in: soon with CHF Clinic   Your physician wants you to follow-up in: 6 months with [name].  You will receive a reminder letter in the mail two months in advance. If you don't receive a letter, please call our office to schedule the follow-up appointment.   If you need a refill on your cardiac medications before your next appointment, please call your pharmacy.   Thank you for choosing [facility]! Christen Bame, RN [phone]

## 2015-11-06 ENCOUNTER — Ambulatory Visit (INDEPENDENT_AMBULATORY_CARE_PROVIDER_SITE_OTHER): Payer: BLUE CROSS/BLUE SHIELD

## 2015-11-06 DIAGNOSIS — Z5181 Encounter for therapeutic drug level monitoring: Secondary | ICD-10-CM

## 2015-11-06 DIAGNOSIS — I2782 Chronic pulmonary embolism: Secondary | ICD-10-CM | POA: Diagnosis not present

## 2015-11-06 LAB — POCT INR: INR: 1.8

## 2015-11-07 ENCOUNTER — Ambulatory Visit: Payer: BLUE CROSS/BLUE SHIELD | Admitting: Emergency Medicine

## 2015-11-12 ENCOUNTER — Other Ambulatory Visit: Payer: Self-pay | Admitting: Cardiovascular Disease

## 2015-11-17 ENCOUNTER — Encounter: Payer: Self-pay | Admitting: Neurology

## 2015-11-17 ENCOUNTER — Ambulatory Visit (INDEPENDENT_AMBULATORY_CARE_PROVIDER_SITE_OTHER): Payer: BLUE CROSS/BLUE SHIELD | Admitting: Neurology

## 2015-11-17 VITALS — BP 140/78 | HR 108 | Ht 70.0 in | Wt 243.4 lb

## 2015-11-17 DIAGNOSIS — G3184 Mild cognitive impairment, so stated: Secondary | ICD-10-CM

## 2015-11-17 DIAGNOSIS — D352 Benign neoplasm of pituitary gland: Secondary | ICD-10-CM

## 2015-11-17 NOTE — Patient Instructions (Addendum)
1. Schedule MRI brain with and without contrast 2. Control of blood pressure, cholesterol, as well as physical exercise and brain stimulation exercises are important for brain health 3. Follow-up in 6 months, call for any changes 
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
## 2015-11-21 ENCOUNTER — Ambulatory Visit (HOSPITAL_COMMUNITY)
Admission: RE | Admit: 2015-11-21 | Discharge: 2015-11-21 | Disposition: A | Discharge status: Home / Self Care | Payer: BLUE CROSS/BLUE SHIELD | Source: Ambulatory Visit | Attending: Internal Medicine | Admitting: Internal Medicine

## 2015-11-21 ENCOUNTER — Encounter (HOSPITAL_COMMUNITY): Payer: Self-pay | Admitting: Internal Medicine

## 2015-11-21 ENCOUNTER — Other Ambulatory Visit (HOSPITAL_COMMUNITY): Payer: Self-pay | Admitting: *Deleted

## 2015-11-21 ENCOUNTER — Encounter (HOSPITAL_COMMUNITY): Payer: Self-pay | Admitting: *Deleted

## 2015-11-21 VITALS — BP 132/80 | HR 97 | Wt 244.8 lb

## 2015-11-21 DIAGNOSIS — Z9861 Coronary angioplasty status: Secondary | ICD-10-CM

## 2015-11-21 DIAGNOSIS — I351 Nonrheumatic aortic (valve) insufficiency: Secondary | ICD-10-CM | POA: Diagnosis not present

## 2015-11-21 DIAGNOSIS — I130 Hypertensive heart and chronic kidney disease with heart failure and stage 1 through stage 4 chronic kidney disease, or unspecified chronic kidney disease: Secondary | ICD-10-CM | POA: Insufficient documentation

## 2015-11-21 DIAGNOSIS — E079 Disorder of thyroid, unspecified: Secondary | ICD-10-CM | POA: Diagnosis not present

## 2015-11-21 DIAGNOSIS — N183 Chronic kidney disease, stage 3 unspecified: Secondary | ICD-10-CM

## 2015-11-21 DIAGNOSIS — R0609 Other forms of dyspnea: Secondary | ICD-10-CM | POA: Insufficient documentation

## 2015-11-21 DIAGNOSIS — Z955 Presence of coronary angioplasty implant and graft: Secondary | ICD-10-CM | POA: Diagnosis not present

## 2015-11-21 DIAGNOSIS — D631 Anemia in chronic kidney disease: Secondary | ICD-10-CM | POA: Diagnosis not present

## 2015-11-21 DIAGNOSIS — Z7902 Long term (current) use of antithrombotics/antiplatelets: Secondary | ICD-10-CM | POA: Insufficient documentation

## 2015-11-21 DIAGNOSIS — Z7901 Long term (current) use of anticoagulants: Secondary | ICD-10-CM | POA: Diagnosis not present

## 2015-11-21 DIAGNOSIS — Z9049 Acquired absence of other specified parts of digestive tract: Secondary | ICD-10-CM | POA: Insufficient documentation

## 2015-11-21 DIAGNOSIS — E669 Obesity, unspecified: Secondary | ICD-10-CM | POA: Insufficient documentation

## 2015-11-21 DIAGNOSIS — N179 Acute kidney failure, unspecified: Secondary | ICD-10-CM | POA: Insufficient documentation

## 2015-11-21 DIAGNOSIS — Z86718 Personal history of other venous thrombosis and embolism: Secondary | ICD-10-CM | POA: Diagnosis not present

## 2015-11-21 DIAGNOSIS — Z9981 Dependence on supplemental oxygen: Secondary | ICD-10-CM | POA: Insufficient documentation

## 2015-11-21 DIAGNOSIS — I5033 Acute on chronic diastolic (congestive) heart failure: Secondary | ICD-10-CM | POA: Diagnosis not present

## 2015-11-21 DIAGNOSIS — Z79899 Other long term (current) drug therapy: Secondary | ICD-10-CM | POA: Diagnosis not present

## 2015-11-21 DIAGNOSIS — Z86711 Personal history of pulmonary embolism: Secondary | ICD-10-CM | POA: Insufficient documentation

## 2015-11-21 DIAGNOSIS — K219 Gastro-esophageal reflux disease without esophagitis: Secondary | ICD-10-CM | POA: Diagnosis not present

## 2015-11-21 DIAGNOSIS — Z7952 Long term (current) use of systemic steroids: Secondary | ICD-10-CM | POA: Insufficient documentation

## 2015-11-21 DIAGNOSIS — R0600 Dyspnea, unspecified: Secondary | ICD-10-CM

## 2015-11-21 DIAGNOSIS — I272 Pulmonary hypertension, unspecified: Secondary | ICD-10-CM | POA: Insufficient documentation

## 2015-11-21 DIAGNOSIS — G8929 Other chronic pain: Secondary | ICD-10-CM | POA: Diagnosis not present

## 2015-11-21 DIAGNOSIS — Z9889 Other specified postprocedural states: Secondary | ICD-10-CM | POA: Diagnosis not present

## 2015-11-21 DIAGNOSIS — I5022 Chronic systolic (congestive) heart failure: Secondary | ICD-10-CM

## 2015-11-21 DIAGNOSIS — I2510 Atherosclerotic heart disease of native coronary artery without angina pectoris: Secondary | ICD-10-CM | POA: Diagnosis not present

## 2015-11-21 NOTE — Patient Instructions (Signed)
Your provider has scheduled you for a heart cath on Monday 11/24/2015 (please see attached cath instructions)  Follow up with Dr.Bensimhon in 2 months

## 2015-11-21 NOTE — Progress Notes (Addendum)
ADVANCED HF CLINIC NOTE  Cardiologist: [name]  History of Present Illness:  [name] is a 75 y.o. 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 for further evaluation of his severe, chronic dyspnea.  He has long h/o significant dyspnea. Has been followed by Drs. [name] and [name]. I also saw him a year or two ago for the same problem. In 2010 echo with EF 40-45%. (previously noted to be 30-35%). Echo repeated in 2012 and EF normalized.   He was admitted to hospital on 05/10/14 for worsening SOB and was found to have submassive PEs. He did not meet criteria for TPA.  He was placed on heparin drip and transitioned to coumadin prior to d/c.   Echo with EF 55-60%. RV noted to be normal. In 7/14 normal Myoview.   F/u CT scan of chest in 5/16 was normal except for small pulmonary nodule A cardiopulmonary stress test revealed mild functional impairment suggestive of a circulatory issue. He was set up for left and right heart catheterization which revealed significant 2V CAD. He underwent PCI/DES to dLAD and rPDA in [id number] which was complicated by coronary disection with chronic occlusion of the PDA.  In 03/2015 he was seen in the office for fatige and SOB. He was found to be anemic and admitted for transfusion. He received one unit of blood and also a transfusion of iron.Stool quiac was negative  His plavix was discontinued.  He was scheduled to see a hemotologist / oncologist since there was no evidence of GI source. 2D ECHO showed EF 60%-65%, G1DD and dilated aortic root at 44 mm. Underwent repeat CPX in 3/17 with 40% reduction in pVO2 with clear circulatory limitation (see report below)  Over past few months says breathing has gotten worse now SOB with any exertion. Diuretics have been titrated and feels that  edema has improved but no really improvement. Was up to lasix 80 bid but decreased to 40 bid due to AKI. Wife says he snores a little bit. Never tested for OSA. No CP or palpitations.   Studies:  RHC 7/16 RA = 6 RV = 28/4/10 PA = 29/15 (21) PCWP = 15 Ao = 148/77 (107) LV = 138/5/18 Fick CO/CI  = 7.8/3.6 Thermo CO/CI = 6.5/3.0 PVR < 1 WU Ao sat 93% PA sat = 71%, 74% High SVC sat = 68% RA sat = 69%  CPX 07/22/14 FVC 3.75, FEV1 2.37, FEV1/FVC 63 BP rest: 124/70 BP peak: 216/92 Peak VO2: 16.8 (76% predicted peak VO2) - when corrected to ideal weight the peak VO2 is 21.2 ml/kg (ibw)/min (81% of the ibw-adjusted predicted).  VE/VCO2 slope: 37 OUES: 1.8 Peak RER: 1.11 Ventilatory Threshold: 9.2 (41.6% predicted measured peak VO2) VE/MVV: 68% PETCO2 at peak: 31 O2pulse: 12  (80% predicted O2pulse)  CPX 3/17 FVC 3.14 (-6%) 15 mins    FEV1 2.12 (-6%) 15 mins     FEV1/FVC 63 (-6%) 10 mins    Resting HR: 94 Peak HR: 130  (89% age predicted max HR) BP rest: 126/66 BP peak: 182/76 Peak VO2: 11.0 (56.6% predicted peak VO2) VE/VCO2 slope: 38 OUES: 1.17 Peak RER: 1.38 Ventilatory Threshold: 7.2 (37% predicted or measured peak VO2) VE/MVV: 60.6 O2pulse: 9  (64% predicted O2pulse)   Past Medical History:  Diagnosis Date  . Acute lower GI bleeding  a. admitted to [facility] 04/11/2013;  b. 04/2014 EGD: 1.  gastric erosions, mild prox gastritis and bulbar duodenitis->Protonix.  . Addison's disease ([place])   . Anemia   . Anemia in CKD (chronic kidney disease) 06/25/2015  . Chronic back pain   . Chronic diastolic CHF (congestive heart failure) ([place])    a. His initial ejection fraction was between 30 and 35%;  b. 04/2013 Echo: EF 50-55% Gr1 DD, mild-mod MR;  c. 04/2014 Echo: EF 60-65%, Gr 1 DD, triv TR.  . CKD (chronic kidney disease), stage III   . Coronary artery disease    a. 07/2014 - s/p difficult PCI - had pressure wire analysis of LAD s/p DES in mid segment c/b  wire-induced dissection in the distal segment of the LAD which was treated with repeated balloon inflations. Also had DES to rPDA - again had either spasm distal to the stent placement or an edge dissection but the vessel was small in that area; procedure aborted.  . Daily headache   . DVT (deep venous thrombosis) ([place])    a. 02/2013 superficial thrombophlebitis --> Xarelto later d/c'd 2/2 GIB;  b. 04/2014 Acute Right PT, Peroneal, Popliteal, Femoral, and common femoral DVT-->coumadin  . GERD (gastroesophageal reflux disease)   . H/O hiatal hernia   . Hepatitis    a. 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  . History of aortic insufficiency   . Hx of cardiovascular stress test    a. ETT-Myoview 7/14:  Low risk, inferior defect consistent with thinning, no ischemia, normal wall motion, EF 54%  . Hypertension   . Hypogonadism male   . Obesity   . On home oxygen therapy    a. added 04/2014 in setting of PE.  Marland Kitchen Peptic ulcer disease   . Pulmonary embolism ([place])    a. 04/2014 CTA: Bilat submassive PE ->coumadin.  . Pulmonary HTN   . Thyroid disease     Past Surgical History:  Procedure Laterality Date  . APPENDECTOMY  1950's  . CARDIAC CATHETERIZATION  06/26/2008   EF 30-35%  . CARDIAC CATHETERIZATION N/A 08/20/2014   Procedure: Right/Left Heart Cath and Coronary Angiography;  Surgeon: Jolaine Artist, MD;  Location: [facility];  Service: Cardiovascular;  Laterality: N/A;  . CARDIAC CATHETERIZATION N/A 08/21/2014   Procedure: Coronary Stent Intervention;  Surgeon: Wellington Hampshire, MD;  Location: [facility];  Service: Cardiovascular;  Laterality: N/A;  . CHOLECYSTECTOMY  ~ 1965  . ESOPHAGOGASTRODUODENOSCOPY N/A 04/11/2013   Procedure: ESOPHAGOGASTRODUODENOSCOPY (EGD);  Surgeon: Missy Sabins, MD;  Location: [facility] ENDOSCOPY;  Service: Endoscopy;  Laterality: N/A;  . ESOPHAGOGASTRODUODENOSCOPY N/A 05/13/2014   Procedure: ESOPHAGOGASTRODUODENOSCOPY (EGD);  Surgeon: Arta Silence, MD;  Location: [facility]  ENDOSCOPY;  Service: Endoscopy;  Laterality: N/A;  . TONSILLECTOMY  1940's  . US ECHOCARDIOGRAPHY  04/09/2010   EF 60-65%    Current Medications:  Prior to Admission medications   Medication Sig Start Date End Date Taking? Authorizing Provider  atorvastatin (LIPITOR) 80 MG tablet Take 1 tablet (80 mg total) by mouth daily. 10/28/14  Yes Thayer Headings, MD  carvedilol (COREG) 6.25 MG tablet TAKE 1 TABLET TWICE A DAY WITH MEALS 11/12/15  Yes Thayer Headings, MD  Cholecalciferol (VITAMIN D3 PO) Take 1,000 capsules by mouth daily.    Yes Historical Provider, MD  ezetimibe (ZETIA) 10 MG tablet Take 1 tablet (10 mg total) by mouth daily. 02/28/15  Yes Thayer Headings, MD  fenofibrate (TRICOR) 48 MG tablet TAKE 1  TABLET (48 MG TOTAL) BY MOUTH DAILY. 10/20/15  Yes Thayer Headings, MD  ferrous sulfate 325 (65 FE) MG tablet Take 1 tablet (325 mg total) by mouth daily with breakfast. 04/17/15  Yes Florencia Reasons, MD  folic acid (FOLVITE) 1 MG tablet Take 1 tablet (1 mg total) by mouth daily. 04/17/15  Yes Florencia Reasons, MD  furosemide (LASIX) 40 MG tablet Take 1 1/2 tablets by mouth twice a day X's 3 days then go back to 1 tablet by mouth twice a day 10/23/15  Yes Bhavinkumar Bhagat, PA  gabapentin (NEURONTIN) 600 MG tablet Take 600 mg by mouth daily.   Yes Historical Provider, MD  hydrocortisone (CORTEF) 20 MG tablet Take 20 mg by mouth 3 (three) times daily.    Yes Historical Provider, MD  isosorbide mononitrate (IMDUR) 30 MG 24 hr tablet Take 0.5 tablets (15 mg total) by mouth daily. 09/05/14  Yes Scott Joylene Draft, PA-C  lisinopril (PRINIVIL,ZESTRIL) 10 MG tablet Take 0.5 tablets (5 mg total) by mouth daily. 05/14/15  Yes Thayer Headings, MD  metoCLOPramide (REGLAN) 5 MG tablet Take 5 mg by mouth daily.    Yes Historical Provider, MD  pantoprazole (PROTONIX) 40 MG tablet Take 1 tablet (40 mg total) by mouth daily. 05/13/14  Yes Donne Hazel, MD  potassium chloride SA (K-DUR,KLOR-CON) 20 MEQ tablet TAKE 1 1/2 TABLETS BY  MOUTH DAILY X'S 3 DAYS THEN GO BACK TO 1 TABLET BY MOUTH DAILY 10/23/15  Yes Bhavinkumar Bhagat, PA  PROAIR HFA 108 (90 Base) MCG/ACT inhaler INHALE 2 PUFFS INTO THE LUNGS EVERY 6 (SIX) HOURS AS NEEDED FOR WHEEZING OR SHORTNESS OF BREATH. 07/08/15  Yes Collene Gobble, MD  thyroid (ARMOUR) 60 MG tablet Take 60 mg by mouth daily.    Yes Historical Provider, MD  traZODone (DESYREL) 100 MG tablet Take 100 mg by mouth every evening.  04/16/14  Yes Historical Provider, MD  warfarin (COUMADIN) 3 MG tablet Please take 6mg  on 3/24, then go back to home dose 3mg  every day except Monday and Friday . On Monday and Friday take 1.5mg . Have INR checked on Monday 3/27. 04/18/15  Yes Florencia Reasons, MD  NITROSTAT 0.4 MG SL tablet PLACE 1 TABLET UNDER TONGUE EVERY 5 MINUTES AS NEEDED FOR CHEST PAIN (UP TO 3 DOSES) Patient not taking: Reported on 11/21/2015 10/29/15   Thayer Headings, MD  promethazine (PHENERGAN) 25 MG tablet Take 25 mg by mouth every 6 (six) hours as needed for nausea.  05/17/14   Historical Provider, MD  traMADol (ULTRAM) 50 MG tablet Take 100 mg by mouth 3 (three) times daily.     Historical Provider, MD     Allergies:   Review of patient's allergies indicates no active allergies.   Social History   Social History  . Marital status: Married    Spouse name: N/A  . Number of children: N/A  . Years of education: N/A   Occupational History  . retired    Social History Main Topics  . Smoking status: Never Smoker  . Smokeless tobacco: Never Used  . Alcohol use No  . Drug use: No  . Sexual activity: Not on file   Other Topics Concern  . Not on file   Social History Narrative  . No narrative on file     Family History:  The patient's family history includes Clotting disorder in his brother, brother, brother, mother, and other.   ROS:   Please see the history of present  illness.    ROS All other systems reviewed and are negative.   PHYSICAL EXAM:   VS:  BP 132/80   Pulse 97   Wt 244 lb 12  oz (111 kg)   SpO2 94%   BMI 35.12 kg/m    GEN: Obese sitting on table with loud pursed lip breathing HEENT: normal  Neck: JVP hard to see- looks 7-8, carotid bruits, or masses Cardiac: RRR; no murmurs, rubs, or gallops, 2+ ankle edema Respiratory: normal breath sounds.  normal work of breathing GI: obese. soft, nontender, + firm distended, + BS MS: no deformity or atrophy  Skin: warm and dry, no rash Neuro:  Alert and Oriented x 3, Strength and sensation are intact Psych: euthymic mood, full affect  Wt Readings from Last 3 Encounters:  11/17/15 243 lb 7 oz (110.4 kg)  10/29/15 244 lb 6.4 oz (110.9 kg)  10/23/15 246 lb 1.9 oz (111.6 kg)      Studies/Labs Reviewed:    Recent Labs: 04/15/2015: TSH 1.24 06/04/2015: ALT 18 06/09/2015: Brain Natriuretic Peptide 23.5; Hemoglobin 11.5 06/10/2015: Platelets 378 10/29/2015: BUN 32; Creat 2.13; Potassium 4.4; Sodium 142   Lipid Panel    Component Value Date/Time   CHOL 177 02/27/2015 1256   TRIG 223 (H) 02/27/2015 1256   HDL 41 02/27/2015 1256   CHOLHDL 4.3 02/27/2015 1256   VLDL 45 (H) 02/27/2015 1256   LDLCALC 91 02/27/2015 1256   LDLDIRECT 58 06/04/2015 1450    Additional studies/ records that were reviewed today include:    2D ECHO: 04/17/2015 LV EF: 60% - 65% Study Conclusions - Left ventricle: The cavity size was normal. Wall thickness was  increased in a pattern of moderate LVH. Systolic function was  normal. The estimated ejection fraction was in the range of 60%  to 65%. Wall motion was normal; there were no regional wall  motion abnormalities. Doppler parameters are consistent with  abnormal left ventricular relaxation (grade 1 diastolic  dysfunction). The E/e&' ratio is between 8-15, suggesting  indeterminate LV filling pressure. - Aortic valve: Trileaflet. Sclerosis without stenosis. There was  trivial regurgitation. - Aorta: Aortic root dimension: 44 mm (ED). - Aortic root: The aortic root is  dilated. - Ascending aorta: The ascending aorta was normal in size. - Mitral valve: Calcified annulus. There was trivial regurgitation. - Left atrium: Moderately dilated at 40 ml/m2. - Tricuspid valve: There was no significant regurgitation. - Inferior vena cava: The vessel was normal in size. The  respirophasic diameter changes were in the normal range (= 50%),  consistent with normal central venous pressure. Impressions: - Compared to a prior study in 2016, there are few changes. The  aortic root measures 4.4 cm. The ascending aorta measures up to 4  cm at the most distal visualized point.   08/20/14 L/RHC Procedures    Right/Left Heart Cath and Coronary Angiography    Conclusion     Dist LAD lesion, 80% stenosed.  Mid RCA lesion, 20% stenosed.  RPDA lesion, 95% stenosed.  RA = 6 RV = 28/4/10 PA = 29/15 (21) PCWP = 15 Ao = 148/77 (107) LV = 138/5/18 Fick CO/CI = 7.8/3.6 Thermo CO/CI = 6.5/3.0 PVR < 1 WU Ao sat 93% PA sat = 71%, 74% High SVC sat = 68% RA sat = 69%   Assessment: 1) 2V CAD  2) Normal pulmonary pressures with elevated cardiac output but no evidence of intracardiac shunt 3) Normal EF by echo Plan/Discussion: Despite his obesity and recent PE,  his PA pressures are surprisingly normal. Suspect some of his dyspnea related to his body habitus but also has significant 2V CAD and will need PCI. With PE 3 months ago will discuss timing of intervention and possibility of triple-drug therapy with [name] and the interventional team.    08/21/14 Procedures    Coronary Stent Intervention    Conclusion    1. Successful pressure wire interrogation of the LAD with subsequent placement of a drug-eluting stent in the midsegment for an initial 70% stenosis with 0% residual stenosis. This was complicated by a wire-induced dissection in the distal segment of the LAD which was treated with repeated balloon inflations.  2. Successful drug-eluting stent  placement to the right PDA. Initial stenosis was 80% which was reduced to 0%. Again, the patient was noted to have either spasm distal to the stent placement or an edge dissection but the vessel was small in that area and given the lack of symptoms and EKG changes I decided to abort the procedure.  Recommendations: This was overall a difficult procedure due to the mentioned issues. Continue hydration to prevent contrast-induced nephropathy. In terms of anticoagulation, warfarin can be resumed tonight. Heparin can be resumed in 8 hours. For now continue with aspirin and Plavix. Once INR is therapeutic, aspirin can be discontinued. The patient can continue on warfarin and Plavix.     ASSESSMENT & PLAN:   1. Dyspnea  --this is severe and chronic. In the past his subjective symptoms have always been out of proportion to objective findings. Most recent CPX test, however, does show significant cardiac limitation. PFTs are not as bad as I would have predicted.  --at this point, I think the first step is to repeat right and left heart cath and also exclude chronic PE with VQ scan --we also talk about the importance of losing weight and beginning and exercise training program slowly --on exam he has loud pursed lip breathing which seems out of proportion to his PFTs and lung exam. I wonder if this may be related to vocal cord dysfunction or psychogenic  2. Acute on chronic diastolic CHF:  ] --Echo reviewed personally ad EF is normal.  --Volume mildly elevated on exam. Will have him take extra lasix x 2 days --Reinforced need for daily weights and reviewed use of sliding scale diuretics. --May benefit from switching to torsemide --Cardiomems device may be very helpful here  3. CAD s/p DES to dLAD and rPDA (02/2014):  -- No clear angina but previous anginal equivalent also seemed to be dyspnea --Previous PCI was very complicated with suboptimal result in both vessels Will continue plavix. No ASA due to  coumadin. Continue BB and statin.  --Repeat R/L heart cath   4. CKD: creat baseline ~2. Check BMET today for pre-cath labs  5. Anemia of chronic disease: admitted 03/2015 for transfusion of blood and iron. Followed by Dr. Beryle Beams for evaluation since no GI source noted.   6. Hx of DVT/bilateral PE: continue coumadin.  --Check VQ to exclude chronic PE --If negative, consider switch to apixaban 2.5 bid for long-term coverage per Amplify-EXT trial  7. Obesity and deconditioning - Discussed need for weight loss ([place]) and exercise program  Total time spent over 60 minutes. Over half that time spent discussing above.   Hayk Divis,MD 12:50 PM

## 2015-11-23 ENCOUNTER — Other Ambulatory Visit: Payer: Self-pay | Admitting: Cardiovascular Disease

## 2015-11-23 DIAGNOSIS — I2510 Atherosclerotic heart disease of native coronary artery without angina pectoris: Secondary | ICD-10-CM

## 2015-11-23 DIAGNOSIS — E785 Hyperlipidemia, unspecified: Secondary | ICD-10-CM

## 2015-11-24 ENCOUNTER — Ambulatory Visit (HOSPITAL_COMMUNITY)
Admission: RE | Admit: 2015-11-24 | Discharge: 2015-11-24 | Disposition: A | Discharge status: Home / Self Care | Payer: BLUE CROSS/BLUE SHIELD | Source: Ambulatory Visit | Attending: Internal Medicine | Admitting: Internal Medicine

## 2015-11-24 ENCOUNTER — Encounter (HOSPITAL_COMMUNITY)
Admission: RE | Disposition: A | Discharge status: Home / Self Care | Payer: Self-pay | Source: Ambulatory Visit | Attending: Internal Medicine

## 2015-11-24 DIAGNOSIS — K219 Gastro-esophageal reflux disease without esophagitis: Secondary | ICD-10-CM | POA: Insufficient documentation

## 2015-11-24 DIAGNOSIS — R0600 Dyspnea, unspecified: Secondary | ICD-10-CM | POA: Diagnosis not present

## 2015-11-24 DIAGNOSIS — I2510 Atherosclerotic heart disease of native coronary artery without angina pectoris: Secondary | ICD-10-CM | POA: Insufficient documentation

## 2015-11-24 DIAGNOSIS — I272 Pulmonary hypertension, unspecified: Secondary | ICD-10-CM | POA: Insufficient documentation

## 2015-11-24 DIAGNOSIS — Z955 Presence of coronary angioplasty implant and graft: Secondary | ICD-10-CM | POA: Insufficient documentation

## 2015-11-24 DIAGNOSIS — Z7952 Long term (current) use of systemic steroids: Secondary | ICD-10-CM | POA: Diagnosis not present

## 2015-11-24 DIAGNOSIS — Z86718 Personal history of other venous thrombosis and embolism: Secondary | ICD-10-CM | POA: Diagnosis not present

## 2015-11-24 DIAGNOSIS — Z7901 Long term (current) use of anticoagulants: Secondary | ICD-10-CM | POA: Insufficient documentation

## 2015-11-24 DIAGNOSIS — Z9981 Dependence on supplemental oxygen: Secondary | ICD-10-CM | POA: Diagnosis not present

## 2015-11-24 DIAGNOSIS — I130 Hypertensive heart and chronic kidney disease with heart failure and stage 1 through stage 4 chronic kidney disease, or unspecified chronic kidney disease: Secondary | ICD-10-CM | POA: Diagnosis not present

## 2015-11-24 DIAGNOSIS — Z79899 Other long term (current) drug therapy: Secondary | ICD-10-CM | POA: Diagnosis not present

## 2015-11-24 DIAGNOSIS — Z86711 Personal history of pulmonary embolism: Secondary | ICD-10-CM | POA: Insufficient documentation

## 2015-11-24 DIAGNOSIS — I5032 Chronic diastolic (congestive) heart failure: Secondary | ICD-10-CM | POA: Diagnosis not present

## 2015-11-24 DIAGNOSIS — E079 Disorder of thyroid, unspecified: Secondary | ICD-10-CM | POA: Insufficient documentation

## 2015-11-24 DIAGNOSIS — E669 Obesity, unspecified: Secondary | ICD-10-CM | POA: Insufficient documentation

## 2015-11-24 DIAGNOSIS — Z6834 Body mass index (BMI) 34.0-34.9, adult: Secondary | ICD-10-CM | POA: Insufficient documentation

## 2015-11-24 DIAGNOSIS — N183 Chronic kidney disease, stage 3 (moderate): Secondary | ICD-10-CM | POA: Diagnosis not present

## 2015-11-24 DIAGNOSIS — D631 Anemia in chronic kidney disease: Secondary | ICD-10-CM | POA: Diagnosis not present

## 2015-11-24 HISTORY — PX: CARDIAC CATHETERIZATION: SHX172

## 2015-11-24 LAB — POCT ACTIVATED CLOTTING TIME: Activated Clotting Time: 175 seconds

## 2015-11-24 LAB — POCT I-STAT 3, VENOUS BLOOD GAS (G3P V)
ACID-BASE EXCESS: 1 mmol/L (ref 0.0–2.0)
Acid-base deficit: 1 mmol/L (ref 0.0–2.0)
BICARBONATE: 24.5 mmol/L (ref 20.0–28.0)
BICARBONATE: 26.4 mmol/L (ref 20.0–28.0)
O2 SAT: 63 %
O2 Saturation: 62 %
PCO2 VEN: 42.3 mmHg — AB (ref 44.0–60.0)
PH VEN: 7.39 (ref 7.250–7.430)
PO2 VEN: 33 mmHg (ref 32.0–45.0)
TCO2: 26 mmol/L (ref 0–100)
TCO2: 28 mmol/L (ref 0–100)
pCO2, Ven: 43.6 mmHg — ABNORMAL LOW (ref 44.0–60.0)
pH, Ven: 7.371 (ref 7.250–7.430)
pO2, Ven: 34 mmHg (ref 32.0–45.0)

## 2015-11-24 LAB — CBC
HCT: 41.1 % (ref 39.0–52.0)
HEMOGLOBIN: 13.1 g/dL (ref 13.0–17.0)
MCH: 30.3 pg (ref 26.0–34.0)
MCHC: 31.9 g/dL (ref 30.0–36.0)
MCV: 95.1 fL (ref 78.0–100.0)
Platelets: 318 10*3/uL (ref 150–400)
RBC: 4.32 MIL/uL (ref 4.22–5.81)
RDW: 16.6 % — AB (ref 11.5–15.5)
WBC: 12.2 10*3/uL — AB (ref 4.0–10.5)

## 2015-11-24 LAB — BASIC METABOLIC PANEL
ANION GAP: 7 (ref 5–15)
BUN: 26 mg/dL — ABNORMAL HIGH (ref 6–20)
CALCIUM: 9.1 mg/dL (ref 8.9–10.3)
CHLORIDE: 106 mmol/L (ref 101–111)
CO2: 27 mmol/L (ref 22–32)
CREATININE: 1.8 mg/dL — AB (ref 0.61–1.24)
GFR calc non Af Amer: 35 mL/min — ABNORMAL LOW (ref >60)
GFR, EST AFRICAN AMERICAN: 41 mL/min — AB (ref >60)
Glucose, Bld: 106 mg/dL — ABNORMAL HIGH (ref 65–99)
Potassium: 3.6 mmol/L (ref 3.5–5.1)
SODIUM: 140 mmol/L (ref 135–145)

## 2015-11-24 LAB — PROTIME-INR
INR: 1.34
PROTHROMBIN TIME: 16.7 s — AB (ref 11.4–15.2)

## 2015-11-24 SURGERY — RIGHT/LEFT HEART CATH AND CORONARY ANGIOGRAPHY
Anesthesia: LOCAL

## 2015-11-24 MED ORDER — SODIUM CHLORIDE 0.9 % IV SOLN: 250.0000 mL | INTRAVENOUS | Status: DC | PRN

## 2015-11-24 MED ORDER — IOPAMIDOL (ISOVUE-370) INJECTION 76%
INTRAVENOUS | Status: DC | PRN
  Administered 2015-11-24: 40 mL

## 2015-11-24 MED ORDER — FENTANYL CITRATE (PF) 100 MCG/2ML IJ SOLN
INTRAMUSCULAR | Status: DC | PRN
  Administered 2015-11-24: 25 ug via INTRAVENOUS

## 2015-11-24 MED ORDER — HEPARIN SODIUM (PORCINE) 1000 UNIT/ML IJ SOLN
INTRAMUSCULAR | Status: AC
  Filled 2015-11-24: qty 1

## 2015-11-24 MED ORDER — SODIUM CHLORIDE 0.9% FLUSH: 3.0000 mL | Freq: Two times a day (BID) | INTRAVENOUS | Status: DC

## 2015-11-24 MED ORDER — SODIUM CHLORIDE 0.9% FLUSH: 3.0000 mL | INTRAVENOUS | Status: DC | PRN

## 2015-11-24 MED ORDER — ACETAMINOPHEN 325 MG PO TABS: 650.0000 mg | ORAL_TABLET | ORAL | Status: DC | PRN

## 2015-11-24 MED ORDER — HEPARIN SODIUM (PORCINE) 1000 UNIT/ML IJ SOLN
INTRAMUSCULAR | Status: DC | PRN
  Administered 2015-11-24: 5000 [IU] via INTRAVENOUS

## 2015-11-24 MED ORDER — LIDOCAINE HCL (PF) 1 % IJ SOLN
INTRAMUSCULAR | Status: AC
  Filled 2015-11-24: qty 30

## 2015-11-24 MED ORDER — HEPARIN (PORCINE) IN NACL 2-0.9 UNIT/ML-% IJ SOLN
INTRAMUSCULAR | Status: DC | PRN
  Administered 2015-11-24: 11:00:00

## 2015-11-24 MED ORDER — VERAPAMIL HCL 2.5 MG/ML IV SOLN
INTRAVENOUS | Status: DC | PRN
  Administered 2015-11-24: 10 mL via INTRA_ARTERIAL

## 2015-11-24 MED ORDER — ASPIRIN 81 MG PO CHEW
81.0000 mg | CHEWABLE_TABLET | ORAL | Status: AC
Start: 2015-11-24 — End: 2015-11-24
  Administered 2015-11-24: 81 mg via ORAL

## 2015-11-24 MED ORDER — MIDAZOLAM HCL 2 MG/2ML IJ SOLN
INTRAMUSCULAR | Status: AC
  Filled 2015-11-24: qty 2

## 2015-11-24 MED ORDER — SODIUM CHLORIDE 0.9 % IV SOLN
INTRAVENOUS | Status: DC
  Administered 2015-11-24: 08:00:00 via INTRAVENOUS

## 2015-11-24 MED ORDER — ASPIRIN 81 MG PO CHEW
CHEWABLE_TABLET | ORAL | Status: AC
  Filled 2015-11-24: qty 1

## 2015-11-24 MED ORDER — HEPARIN (PORCINE) IN NACL 2-0.9 UNIT/ML-% IJ SOLN
INTRAMUSCULAR | Status: AC
  Filled 2015-11-24: qty 1500

## 2015-11-24 MED ORDER — LIDOCAINE HCL (PF) 1 % IJ SOLN
INTRAMUSCULAR | Status: DC | PRN
  Administered 2015-11-24: 2 mL
  Administered 2015-11-24: 15 mL
  Administered 2015-11-24: 2 mL

## 2015-11-24 MED ORDER — SODIUM CHLORIDE 0.9 % IV SOLN: INTRAVENOUS | Status: AC

## 2015-11-24 MED ORDER — VERAPAMIL HCL 2.5 MG/ML IV SOLN
INTRAVENOUS | Status: AC
  Filled 2015-11-24: qty 2

## 2015-11-24 MED ORDER — ONDANSETRON HCL 4 MG/2ML IJ SOLN: 4.0000 mg | Freq: Four times a day (QID) | INTRAMUSCULAR | Status: DC | PRN

## 2015-11-24 MED ORDER — IOPAMIDOL (ISOVUE-370) INJECTION 76%
INTRAVENOUS | Status: AC
  Filled 2015-11-24: qty 100

## 2015-11-24 MED ORDER — MIDAZOLAM HCL 2 MG/2ML IJ SOLN
INTRAMUSCULAR | Status: DC | PRN
  Administered 2015-11-24: 1 mg via INTRAVENOUS

## 2015-11-24 MED ORDER — FENTANYL CITRATE (PF) 100 MCG/2ML IJ SOLN
INTRAMUSCULAR | Status: AC
  Filled 2015-11-24: qty 2

## 2015-11-24 SURGICAL SUPPLY — 14 items
CATH BALLN WEDGE 5F 110CM (CATHETERS) ×1 IMPLANT
CATH IMPULSE 5F ANG/FL3.5 (CATHETERS) ×2 IMPLANT
CATH SWAN GANZ 7F STRAIGHT (CATHETERS) ×1 IMPLANT
DEVICE RAD COMP TR BAND LRG (VASCULAR PRODUCTS) ×1 IMPLANT
GLIDESHEATH SLEND SS 6F .021 (SHEATH) ×1 IMPLANT
KIT HEART LEFT (KITS) ×2 IMPLANT
PACK CARDIAC CATHETERIZATION (CUSTOM PROCEDURE TRAY) ×2 IMPLANT
SHEATH FAST CATH BRACH 5F 5CM (SHEATH) ×1 IMPLANT
SHEATH PINNACLE 7F 10CM (SHEATH) ×2 IMPLANT
TRANSDUCER W/STOPCOCK (MISCELLANEOUS) ×3 IMPLANT
TUBING CIL FLEX 10 FLL-RA (TUBING) ×2 IMPLANT
WIRE EMERALD 3MM-J .025X260CM (WIRE) ×1 IMPLANT
WIRE HI TORQ VERSACORE-J 145CM (WIRE) ×1 IMPLANT
WIRE SAFE-T 1.5MM-J .035X260CM (WIRE) ×2 IMPLANT

## 2015-11-24 NOTE — Progress Notes (Addendum)
Site area: RFV Site Prior to Removal:  Level 0 Pressure Applied For:10 min Manual:  yes  Patient Status During Pull: stable  Post Pull Site:  Level 0 Post Pull Instructions Given: yes  Post Pull Pulses Present: palpable Dressing Applied:  tegaderm Bedrest begins @ 1135 am Comments:

## 2015-11-24 NOTE — H&P (View-Only) (Signed)
ADVANCED HF CLINIC NOTE  Cardiologist: [name]  History of Present Illness:  [name] is a 75 y.o. 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 for further evaluation of his severe, chronic dyspnea.  He has long h/o significant dyspnea. Has been followed by Drs. [name] and [name]. I also saw him a year or two ago for the same problem. In 2010 echo with EF 40-45%. (previously noted to be 30-35%). Echo repeated in 2012 and EF normalized.   He was admitted to hospital on 05/10/14 for worsening SOB and was found to have submassive PEs. He did not meet criteria for TPA.  He was placed on heparin drip and transitioned to coumadin prior to d/c.   Echo with EF 55-60%. RV noted to be normal. In 7/14 normal Myoview.   F/u CT scan of chest in 5/16 was normal except for small pulmonary nodule A cardiopulmonary stress test revealed mild functional impairment suggestive of a circulatory issue. He was set up for left and right heart catheterization which revealed significant 2V CAD. He underwent PCI/DES to dLAD and rPDA in [id number] which was complicated by coronary disection with chronic occlusion of the PDA.  In 03/2015 he was seen in the office for fatige and SOB. He was found to be anemic and admitted for transfusion. He received one unit of blood and also a transfusion of iron.Stool quiac was negative  His plavix was discontinued.  He was scheduled to see a hemotologist / oncologist since there was no evidence of GI source. 2D ECHO showed EF 60%-65%, G1DD and dilated aortic root at 44 mm. Underwent repeat CPX in 3/17 with 40% reduction in pVO2 with clear circulatory limitation (see report below)  Over past few months says breathing has gotten worse now SOB with any exertion. Diuretics have been titrated and feels that  edema has improved but no really improvement. Was up to lasix 80 bid but decreased to 40 bid due to AKI. Wife says he snores a little bit. Never tested for OSA. No CP or palpitations.   Studies:  RHC 7/16 RA = 6 RV = 28/4/10 PA = 29/15 (21) PCWP = 15 Ao = 148/77 (107) LV = 138/5/18 Fick CO/CI  = 7.8/3.6 Thermo CO/CI = 6.5/3.0 PVR < 1 WU Ao sat 93% PA sat = 71%, 74% High SVC sat = 68% RA sat = 69%  CPX 07/22/14 FVC 3.75, FEV1 2.37, FEV1/FVC 63 BP rest: 124/70 BP peak: 216/92 Peak VO2: 16.8 (76% predicted peak VO2) - when corrected to ideal weight the peak VO2 is 21.2 ml/kg (ibw)/min (81% of the ibw-adjusted predicted).  VE/VCO2 slope: 37 OUES: 1.8 Peak RER: 1.11 Ventilatory Threshold: 9.2 (41.6% predicted measured peak VO2) VE/MVV: 68% PETCO2 at peak: 31 O2pulse: 12  (80% predicted O2pulse)  CPX 3/17 FVC 3.14 (-6%) 15 mins    FEV1 2.12 (-6%) 15 mins     FEV1/FVC 63 (-6%) 10 mins    Resting HR: 94 Peak HR: 130  (89% age predicted max HR) BP rest: 126/66 BP peak: 182/76 Peak VO2: 11.0 (56.6% predicted peak VO2) VE/VCO2 slope: 38 OUES: 1.17 Peak RER: 1.38 Ventilatory Threshold: 7.2 (37% predicted or measured peak VO2) VE/MVV: 60.6 O2pulse: 9  (64% predicted O2pulse)   Past Medical History:  Diagnosis Date  . Acute lower GI bleeding  a. admitted to [facility] 04/11/2013;  b. 04/2014 EGD: 1.  gastric erosions, mild prox gastritis and bulbar duodenitis->Protonix.  . Addison's disease ([place])   . Anemia   . Anemia in CKD (chronic kidney disease) 06/25/2015  . Chronic back pain   . Chronic diastolic CHF (congestive heart failure) ([place])    a. His initial ejection fraction was between 30 and 35%;  b. 04/2013 Echo: EF 50-55% Gr1 DD, mild-mod MR;  c. 04/2014 Echo: EF 60-65%, Gr 1 DD, triv TR.  . CKD (chronic kidney disease), stage III   . Coronary artery disease    a. 07/2014 - s/p difficult PCI - had pressure wire analysis of LAD s/p DES in mid segment c/b  wire-induced dissection in the distal segment of the LAD which was treated with repeated balloon inflations. Also had DES to rPDA - again had either spasm distal to the stent placement or an edge dissection but the vessel was small in that area; procedure aborted.  . Daily headache   . DVT (deep venous thrombosis) ([place])    a. 02/2013 superficial thrombophlebitis --> Xarelto later d/c'd 2/2 GIB;  b. 04/2014 Acute Right PT, Peroneal, Popliteal, Femoral, and common femoral DVT-->coumadin  . GERD (gastroesophageal reflux disease)   . H/O hiatal hernia   . Hepatitis    a. 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  . History of aortic insufficiency   . Hx of cardiovascular stress test    a. ETT-Myoview 7/14:  Low risk, inferior defect consistent with thinning, no ischemia, normal wall motion, EF 54%  . Hypertension   . Hypogonadism male   . Obesity   . On home oxygen therapy    a. added 04/2014 in setting of PE.  Marland Kitchen Peptic ulcer disease   . Pulmonary embolism ([place])    a. 04/2014 CTA: Bilat submassive PE ->coumadin.  . Pulmonary HTN   . Thyroid disease     Past Surgical History:  Procedure Laterality Date  . APPENDECTOMY  1950's  . CARDIAC CATHETERIZATION  06/26/2008   EF 30-35%  . CARDIAC CATHETERIZATION N/A 08/20/2014   Procedure: Right/Left Heart Cath and Coronary Angiography;  Surgeon: Jolaine Artist, MD;  Location: [facility];  Service: Cardiovascular;  Laterality: N/A;  . CARDIAC CATHETERIZATION N/A 08/21/2014   Procedure: Coronary Stent Intervention;  Surgeon: Wellington Hampshire, MD;  Location: [facility];  Service: Cardiovascular;  Laterality: N/A;  . CHOLECYSTECTOMY  ~ 1965  . ESOPHAGOGASTRODUODENOSCOPY N/A 04/11/2013   Procedure: ESOPHAGOGASTRODUODENOSCOPY (EGD);  Surgeon: Missy Sabins, MD;  Location: [facility] ENDOSCOPY;  Service: Endoscopy;  Laterality: N/A;  . ESOPHAGOGASTRODUODENOSCOPY N/A 05/13/2014   Procedure: ESOPHAGOGASTRODUODENOSCOPY (EGD);  Surgeon: Arta Silence, MD;  Location: [facility]  ENDOSCOPY;  Service: Endoscopy;  Laterality: N/A;  . TONSILLECTOMY  1940's  . US ECHOCARDIOGRAPHY  04/09/2010   EF 60-65%    Current Medications:  Prior to Admission medications   Medication Sig Start Date End Date Taking? Authorizing Provider  atorvastatin (LIPITOR) 80 MG tablet Take 1 tablet (80 mg total) by mouth daily. 10/28/14  Yes Thayer Headings, MD  carvedilol (COREG) 6.25 MG tablet TAKE 1 TABLET TWICE A DAY WITH MEALS 11/12/15  Yes Thayer Headings, MD  Cholecalciferol (VITAMIN D3 PO) Take 1,000 capsules by mouth daily.    Yes Historical Provider, MD  ezetimibe (ZETIA) 10 MG tablet Take 1 tablet (10 mg total) by mouth daily. 02/28/15  Yes Thayer Headings, MD  fenofibrate (TRICOR) 48 MG tablet TAKE 1  TABLET (48 MG TOTAL) BY MOUTH DAILY. 10/20/15  Yes Thayer Headings, MD  ferrous sulfate 325 (65 FE) MG tablet Take 1 tablet (325 mg total) by mouth daily with breakfast. 04/17/15  Yes Florencia Reasons, MD  folic acid (FOLVITE) 1 MG tablet Take 1 tablet (1 mg total) by mouth daily. 04/17/15  Yes Florencia Reasons, MD  furosemide (LASIX) 40 MG tablet Take 1 1/2 tablets by mouth twice a day X's 3 days then go back to 1 tablet by mouth twice a day 10/23/15  Yes Bhavinkumar Bhagat, PA  gabapentin (NEURONTIN) 600 MG tablet Take 600 mg by mouth daily.   Yes Historical Provider, MD  hydrocortisone (CORTEF) 20 MG tablet Take 20 mg by mouth 3 (three) times daily.    Yes Historical Provider, MD  isosorbide mononitrate (IMDUR) 30 MG 24 hr tablet Take 0.5 tablets (15 mg total) by mouth daily. 09/05/14  Yes Scott Joylene Draft, PA-C  lisinopril (PRINIVIL,ZESTRIL) 10 MG tablet Take 0.5 tablets (5 mg total) by mouth daily. 05/14/15  Yes Thayer Headings, MD  metoCLOPramide (REGLAN) 5 MG tablet Take 5 mg by mouth daily.    Yes Historical Provider, MD  pantoprazole (PROTONIX) 40 MG tablet Take 1 tablet (40 mg total) by mouth daily. 05/13/14  Yes Donne Hazel, MD  potassium chloride SA (K-DUR,KLOR-CON) 20 MEQ tablet TAKE 1 1/2 TABLETS BY  MOUTH DAILY X'S 3 DAYS THEN GO BACK TO 1 TABLET BY MOUTH DAILY 10/23/15  Yes Bhavinkumar Bhagat, PA  PROAIR HFA 108 (90 Base) MCG/ACT inhaler INHALE 2 PUFFS INTO THE LUNGS EVERY 6 (SIX) HOURS AS NEEDED FOR WHEEZING OR SHORTNESS OF BREATH. 07/08/15  Yes Collene Gobble, MD  thyroid (ARMOUR) 60 MG tablet Take 60 mg by mouth daily.    Yes Historical Provider, MD  traZODone (DESYREL) 100 MG tablet Take 100 mg by mouth every evening.  04/16/14  Yes Historical Provider, MD  warfarin (COUMADIN) 3 MG tablet Please take 6mg  on 3/24, then go back to home dose 3mg  every day except Monday and Friday . On Monday and Friday take 1.5mg . Have INR checked on Monday 3/27. 04/18/15  Yes Florencia Reasons, MD  NITROSTAT 0.4 MG SL tablet PLACE 1 TABLET UNDER TONGUE EVERY 5 MINUTES AS NEEDED FOR CHEST PAIN (UP TO 3 DOSES) Patient not taking: Reported on 11/21/2015 10/29/15   Thayer Headings, MD  promethazine (PHENERGAN) 25 MG tablet Take 25 mg by mouth every 6 (six) hours as needed for nausea.  05/17/14   Historical Provider, MD  traMADol (ULTRAM) 50 MG tablet Take 100 mg by mouth 3 (three) times daily.     Historical Provider, MD     Allergies:   Review of patient's allergies indicates no active allergies.   Social History   Social History  . Marital status: Married    Spouse name: N/A  . Number of children: N/A  . Years of education: N/A   Occupational History  . retired    Social History Main Topics  . Smoking status: Never Smoker  . Smokeless tobacco: Never Used  . Alcohol use No  . Drug use: No  . Sexual activity: Not on file   Other Topics Concern  . Not on file   Social History Narrative  . No narrative on file     Family History:  The patient's family history includes Clotting disorder in his brother, brother, brother, mother, and other.   ROS:   Please see the history of present  illness.    ROS All other systems reviewed and are negative.   PHYSICAL EXAM:   VS:  BP 132/80   Pulse 97   Wt 244 lb 12  oz (111 kg)   SpO2 94%   BMI 35.12 kg/m    GEN: Obese sitting on table with loud pursed lip breathing HEENT: normal  Neck: JVP hard to see- looks 7-8, carotid bruits, or masses Cardiac: RRR; no murmurs, rubs, or gallops, 2+ ankle edema Respiratory: normal breath sounds.  normal work of breathing GI: obese. soft, nontender, + firm distended, + BS MS: no deformity or atrophy  Skin: warm and dry, no rash Neuro:  Alert and Oriented x 3, Strength and sensation are intact Psych: euthymic mood, full affect  Wt Readings from Last 3 Encounters:  11/17/15 243 lb 7 oz (110.4 kg)  10/29/15 244 lb 6.4 oz (110.9 kg)  10/23/15 246 lb 1.9 oz (111.6 kg)      Studies/Labs Reviewed:    Recent Labs: 04/15/2015: TSH 1.24 06/04/2015: ALT 18 06/09/2015: Brain Natriuretic Peptide 23.5; Hemoglobin 11.5 06/10/2015: Platelets 378 10/29/2015: BUN 32; Creat 2.13; Potassium 4.4; Sodium 142   Lipid Panel    Component Value Date/Time   CHOL 177 02/27/2015 1256   TRIG 223 (H) 02/27/2015 1256   HDL 41 02/27/2015 1256   CHOLHDL 4.3 02/27/2015 1256   VLDL 45 (H) 02/27/2015 1256   LDLCALC 91 02/27/2015 1256   LDLDIRECT 58 06/04/2015 1450    Additional studies/ records that were reviewed today include:    2D ECHO: 04/17/2015 LV EF: 60% - 65% Study Conclusions - Left ventricle: The cavity size was normal. Wall thickness was  increased in a pattern of moderate LVH. Systolic function was  normal. The estimated ejection fraction was in the range of 60%  to 65%. Wall motion was normal; there were no regional wall  motion abnormalities. Doppler parameters are consistent with  abnormal left ventricular relaxation (grade 1 diastolic  dysfunction). The E/e&' ratio is between 8-15, suggesting  indeterminate LV filling pressure. - Aortic valve: Trileaflet. Sclerosis without stenosis. There was  trivial regurgitation. - Aorta: Aortic root dimension: 44 mm (ED). - Aortic root: The aortic root is  dilated. - Ascending aorta: The ascending aorta was normal in size. - Mitral valve: Calcified annulus. There was trivial regurgitation. - Left atrium: Moderately dilated at 40 ml/m2. - Tricuspid valve: There was no significant regurgitation. - Inferior vena cava: The vessel was normal in size. The  respirophasic diameter changes were in the normal range (= 50%),  consistent with normal central venous pressure. Impressions: - Compared to a prior study in 2016, there are few changes. The  aortic root measures 4.4 cm. The ascending aorta measures up to 4  cm at the most distal visualized point.   08/20/14 L/RHC Procedures    Right/Left Heart Cath and Coronary Angiography    Conclusion     Dist LAD lesion, 80% stenosed.  Mid RCA lesion, 20% stenosed.  RPDA lesion, 95% stenosed.  RA = 6 RV = 28/4/10 PA = 29/15 (21) PCWP = 15 Ao = 148/77 (107) LV = 138/5/18 Fick CO/CI = 7.8/3.6 Thermo CO/CI = 6.5/3.0 PVR < 1 WU Ao sat 93% PA sat = 71%, 74% High SVC sat = 68% RA sat = 69%   Assessment: 1) 2V CAD  2) Normal pulmonary pressures with elevated cardiac output but no evidence of intracardiac shunt 3) Normal EF by echo Plan/Discussion: Despite his obesity and recent PE,  his PA pressures are surprisingly normal. Suspect some of his dyspnea related to his body habitus but also has significant 2V CAD and will need PCI. With PE 3 months ago will discuss timing of intervention and possibility of triple-drug therapy with [name] and the interventional team.    08/21/14 Procedures    Coronary Stent Intervention    Conclusion    1. Successful pressure wire interrogation of the LAD with subsequent placement of a drug-eluting stent in the midsegment for an initial 70% stenosis with 0% residual stenosis. This was complicated by a wire-induced dissection in the distal segment of the LAD which was treated with repeated balloon inflations.  2. Successful drug-eluting stent  placement to the right PDA. Initial stenosis was 80% which was reduced to 0%. Again, the patient was noted to have either spasm distal to the stent placement or an edge dissection but the vessel was small in that area and given the lack of symptoms and EKG changes I decided to abort the procedure.  Recommendations: This was overall a difficult procedure due to the mentioned issues. Continue hydration to prevent contrast-induced nephropathy. In terms of anticoagulation, warfarin can be resumed tonight. Heparin can be resumed in 8 hours. For now continue with aspirin and Plavix. Once INR is therapeutic, aspirin can be discontinued. The patient can continue on warfarin and Plavix.     ASSESSMENT & PLAN:   1. Dyspnea  --this is severe and chronic. In the past his subjective symptoms have always been out of proportion to objective findings. Most recent CPX test, however, does show significant cardiac limitation. PFTs are not as bad as I would have predicted.  --at this point, I think the first step is to repeat right and left heart cath and also exclude chronic PE with VQ scan --we also talk about the importance of losing weight and beginning and exercise training program slowly --on exam he has loud pursed lip breathing which seems out of proportion to his PFTs and lung exam. I wonder if this may be related to vocal cord dysfunction or psychogenic  2. Acute on chronic diastolic CHF:  ] --Echo reviewed personally ad EF is normal.  --Volume mildly elevated on exam. Will have him take extra lasix x 2 days --Reinforced need for daily weights and reviewed use of sliding scale diuretics. --May benefit from switching to torsemide --Cardiomems device may be very helpful here  3. CAD s/p DES to dLAD and rPDA (02/2014):  -- No clear angina but previous anginal equivalent also seemed to be dyspnea --Previous PCI was very complicated with suboptimal result in both vessels Will continue plavix. No ASA due to  coumadin. Continue BB and statin.  --Repeat R/L heart cath   4. CKD: creat baseline ~2. Check BMET today for pre-cath labs  5. Anemia of chronic disease: admitted 03/2015 for transfusion of blood and iron. Followed by Dr. Beryle Beams for evaluation since no GI source noted.   6. Hx of DVT/bilateral PE: continue coumadin.  --Check VQ to exclude chronic PE --If negative, consider switch to apixaban 2.5 bid for long-term coverage per Amplify-EXT trial  7. Obesity and deconditioning - Discussed need for weight loss ([place]) and exercise program  Total time spent over 60 minutes. Over half that time spent discussing above.   Bensimhon, Daniel,MD 12:50 PM

## 2015-11-24 NOTE — Discharge Instructions (Signed)
Groin Surgical Site Care Refer to this sheet in the next few weeks. These instructions provide you with information about caring for yourself after your procedure. Your health care provider may also give you more specific instructions. Your treatment has been planned according to current medical practices, but problems sometimes occur. Call your health care provider if you have any problems or questions after your procedure. WHAT TO EXPECT AFTER THE PROCEDURE After your procedure, it is typical to have the following:  Bruising at the groin site that usually fades within 1-2 weeks.  Blood collecting in the tissue (hematoma) that may be painful to the touch. It should usually decrease in size and tenderness within 1-2 weeks. HOME CARE INSTRUCTIONS  Take medicines only as directed by your health care provider.  You may shower 24-48 hours after the procedure or as directed by your health care provider. Remove the bandage (dressing) and gently wash the site with plain soap and water. Pat the area dry with a clean towel. Do not rub the site, because this may cause bleeding.  Do not take baths, swim, or use a hot tub until your health care provider approves.  Check your insertion site every day for redness, swelling, or drainage.  Do not apply powder or lotion to the site.  Limit use of stairs to twice a day for the first 2-3 days or as directed by your health care provider.  Do not squat for the first 2-3 days or as directed by your health care provider.  Do not lift over 10 lb (4.5 kg) for 5 days after your procedure or as directed by your health care provider.  Ask your health care provider when it is okay to:  Return to work or school.  Resume usual physical activities or sports.  Resume sexual activity.  Do not drive home if you are discharged the same day as the procedure. Have someone else drive you.  You may drive 24 hours after the procedure unless otherwise instructed by your  health care provider.  Do not operate machinery or power tools for 24 hours after the procedure or as directed by your health care provider.  If your procedure was done as an outpatient procedure, which means that you went home the same day as your procedure, a responsible adult should be with you for the first 24 hours after you arrive home.  Keep all follow-up visits as directed by your health care provider. This is important. SEEK MEDICAL CARE IF:  You have a fever.  You have chills.  You have increased bleeding from the groin site. Hold pressure on the site. SEEK IMMEDIATE MEDICAL CARE IF:  You have unusual pain at the groin site.  You have redness, warmth, or swelling at the groin site.  You have drainage (other than a small amount of blood on the dressing) from the groin site.  The groin site is bleeding, and the bleeding does not stop after 30 minutes of holding steady pressure on the site.  Your leg or foot becomes pale, cool, tingly, or numb.   This information is not intended to replace advice given to you by your health care provider. Make sure you discuss any questions you have with your health care provider.   Document Released: 09/14/2013 Document Reviewed: 09/14/2013 Elsevier Interactive Patient Education 2016 [place] Refer to this sheet in the next few weeks. These instructions provide you with information about caring for yourself after your  procedure. Your health care provider may also give you more specific instructions. Your treatment has been planned according to current medical practices, but problems sometimes occur. Call your health care provider if you have any problems or questions after your procedure. WHAT TO EXPECT AFTER THE PROCEDURE After your procedure, it is typical to have the following:  Bruising at the radial site that usually fades within 1-2 weeks.  Blood collecting in the tissue (hematoma) that may be painful to  the touch. It should usually decrease in size and tenderness within 1-2 weeks. HOME CARE INSTRUCTIONS  Take medicines only as directed by your health care provider.  You may shower 24-48 hours after the procedure or as directed by your health care provider. Remove the bandage (dressing) and gently wash the site with plain soap and water. Pat the area dry with a clean towel. Do not rub the site, because this may cause bleeding.  Do not take baths, swim, or use a hot tub until your health care provider approves.  Check your insertion site every day for redness, swelling, or drainage.  Do not apply powder or lotion to the site.  Do not flex or bend the affected arm for 24 hours or as directed by your health care provider.  Do not push or pull heavy objects with the affected arm for 24 hours or as directed by your health care provider.  Do not lift over 10 lb (4.5 kg) for 5 days after your procedure or as directed by your health care provider.  Ask your health care provider when it is okay to:  Return to work or school.  Resume usual physical activities or sports.  Resume sexual activity.  Do not drive home if you are discharged the same day as the procedure. Have someone else drive you.  You may drive 24 hours after the procedure unless otherwise instructed by your health care provider.  Do not operate machinery or power tools for 24 hours after the procedure.  If your procedure was done as an outpatient procedure, which means that you went home the same day as your procedure, a responsible adult should be with you for the first 24 hours after you arrive home.  Keep all follow-up visits as directed by your health care provider. This is important. SEEK MEDICAL CARE IF:  You have a fever.  You have chills.  You have increased bleeding from the radial site. Hold pressure on the site. SEEK IMMEDIATE MEDICAL CARE IF:  You have unusual pain at the radial site.  You have redness,  warmth, or swelling at the radial site.  You have drainage (other than a small amount of blood on the dressing) from the radial site.  The radial site is bleeding, and the bleeding does not stop after 30 minutes of holding steady pressure on the site.  Your arm or hand becomes pale, cool, tingly, or numb.   This information is not intended to replace advice given to you by your health care provider. Make sure you discuss any questions you have with your health care provider.   Document Released: 02/13/2010 Document Revised: 02/01/2014 Document Reviewed: 07/30/2013 Elsevier Interactive Patient Education [employer].

## 2015-11-24 NOTE — Interval H&P Note (Signed)
History and Physical Interval Note:  11/24/2015 9:44 AM  [name]  has presented today for surgery, with the diagnosis of sob  The various methods of treatment have been discussed with the patient and family. After consideration of risks, benefits and other options for treatment, the patient has consented to  Procedure(s): Right/Left Heart Cath and Coronary Angiography (N/A) and possible angioplasty as a surgical intervention .  The patient's history has been reviewed, patient examined, no change in status, stable for surgery.  I have reviewed the patient's chart and labs.  Questions were answered to the patient's satisfaction.     Rylei Masella, Quillian Quince

## 2015-11-25 ENCOUNTER — Encounter (HOSPITAL_COMMUNITY): Payer: Self-pay | Admitting: Internal Medicine

## 2015-11-27 ENCOUNTER — Inpatient Hospital Stay: Admission: RE | Admit: 2015-11-27 | Payer: BLUE CROSS/BLUE SHIELD | Source: Ambulatory Visit

## 2015-12-02 ENCOUNTER — Ambulatory Visit: Payer: BLUE CROSS/BLUE SHIELD | Admitting: Emergency Medicine

## 2015-12-12 ENCOUNTER — Ambulatory Visit (INDEPENDENT_AMBULATORY_CARE_PROVIDER_SITE_OTHER): Payer: BLUE CROSS/BLUE SHIELD | Admitting: Pharmacist Clinician (PhC)/ Clinical Pharmacy Specialist

## 2015-12-12 DIAGNOSIS — I2782 Chronic pulmonary embolism: Secondary | ICD-10-CM

## 2015-12-12 DIAGNOSIS — Z5181 Encounter for therapeutic drug level monitoring: Secondary | ICD-10-CM | POA: Diagnosis not present

## 2015-12-12 LAB — POCT INR: INR: 1.8

## 2015-12-23 ENCOUNTER — Telehealth: Payer: Self-pay | Admitting: *Deleted

## 2015-12-23 NOTE — Telephone Encounter (Signed)
PA for MRI Brain WO: [id number]

## 2015-12-26 ENCOUNTER — Inpatient Hospital Stay: Admission: RE | Admit: 2015-12-26 | Payer: BLUE CROSS/BLUE SHIELD | Source: Ambulatory Visit

## 2016-01-04 ENCOUNTER — Other Ambulatory Visit: Payer: BLUE CROSS/BLUE SHIELD

## 2016-01-21 ENCOUNTER — Encounter (HOSPITAL_COMMUNITY): Payer: BLUE CROSS/BLUE SHIELD | Admitting: Internal Medicine

## 2016-01-30 ENCOUNTER — Ambulatory Visit: Payer: BLUE CROSS/BLUE SHIELD | Admitting: Emergency Medicine

## 2016-02-13 ENCOUNTER — Encounter (HOSPITAL_COMMUNITY): Payer: BLUE CROSS/BLUE SHIELD | Admitting: Internal Medicine

## 2016-02-26 ENCOUNTER — Telehealth: Payer: Self-pay | Admitting: Emergency Medicine

## 2016-02-26 NOTE — Telephone Encounter (Signed)
RB are you okay with pt switching physicians? Thanks.

## 2016-02-27 NOTE — Telephone Encounter (Signed)
Maybe try [facility] or [facility] if he is looking for second opinion

## 2016-02-27 NOTE — Telephone Encounter (Signed)
Patrice ok to schedule this pt per RB with either [facility] or [facility]

## 2016-02-27 NOTE — Telephone Encounter (Signed)
Yes this is Ok with me 

## 2016-02-27 NOTE — Telephone Encounter (Signed)
RB do you have a suggestion as to who you think would be the best fit with the pt? Thanks.

## 2016-03-02 NOTE — Telephone Encounter (Signed)
Called and spoke with pts wife and she is aware of appt with JN in march.  Nothing further is needed.

## 2016-03-03 ENCOUNTER — Other Ambulatory Visit: Payer: Self-pay | Admitting: Emergency Medicine

## 2016-03-08 ENCOUNTER — Encounter (HOSPITAL_COMMUNITY): Payer: BLUE CROSS/BLUE SHIELD | Admitting: Internal Medicine

## 2016-03-12 ENCOUNTER — Other Ambulatory Visit: Payer: Self-pay | Admitting: Emergency Medicine

## 2016-03-22 ENCOUNTER — Other Ambulatory Visit: Payer: Self-pay | Admitting: Cardiovascular Disease

## 2016-03-22 MED ORDER — NITROGLYCERIN 0.4 MG SL SUBL: SUBLINGUAL_TABLET | SUBLINGUAL | 1 refills | Status: AC

## 2016-03-23 ENCOUNTER — Other Ambulatory Visit: Payer: Self-pay | Admitting: *Deleted

## 2016-03-29 ENCOUNTER — Telehealth: Payer: Self-pay | Admitting: Pulmonary Disease

## 2016-03-29 ENCOUNTER — Institutional Professional Consult (permissible substitution): Payer: BLUE CROSS/BLUE SHIELD | Admitting: Pulmonary Disease

## 2016-03-29 NOTE — Telephone Encounter (Signed)
PFT 06/22/13: FVC 3.88 L (97%) FEV1 2.70 L (92%) FEV1/FVC 0.70 FEF 25-75 1.73 L (79%) negative bronchodilator response TLC 6.30 L (95%) RV 99% ERV 82% DLCO uncorrected 59%  IMAGING PORT CXR 04/16/15 (personally reviewed by me): Persistent elevation of left hemidiaphragm with low lung volumes. No parenchymal mass or opacity appreciated. No pleural effusion.   CTA CHEST 06/21/14 (personally reviewed by me): No pulmonary emboli. No pathologic mediastinal or hilar adenopathy. No pleural or pericardial effusion. 7 mm nodule within left upper lobe not present on previous CT imaging in 2010 upon my review. No other parenchymal mass or opacity appreciated.  CARDIAC RIGHT & LEFT HEART CATHETERIZATION (11/24/15):  Mid RCA lesion, 30 %stenosed.  RPDA-1 lesion, 40 %stenosed.  RPDA-2 lesion, 0 %stenosed.  Dist LAD-2 lesion, 30 %stenosed.  Dist LAD-1 lesion, 0 %stenosed.   Findings: AO = 146/82 (109) LV = 128/13 RA = 11 RV = 35/11 PA = 33/10 (22) PCW = 18 Fick cardiac output/index = 5.0/2.2 Thermo CO/CI = 6.4/2.8 PVR = < 1.0 WU FA sat = 96% PA sat = 62%, 61%  TTE (04/17/15): Moderate LVH with normal cavity size. EF 60-65% with no regional wall motion abnormalities & grade 1 diastolic dysfunction. LA moderately dilated & RA normal in size. RV normal in size and function. Trivial aortic regurgitation. Aortic root dilated. Trivial mitral regurgitation. No defect or PFO to atrial septum. No significant pulmonic regurgitation. No significant tricuspid regurgitation. No pericardial effusion.

## 2016-03-31 ENCOUNTER — Other Ambulatory Visit: Payer: Self-pay | Admitting: Cardiovascular Disease

## 2016-04-06 ENCOUNTER — Telehealth: Payer: Self-pay | Admitting: Neurology

## 2016-04-06 DIAGNOSIS — G3184 Mild cognitive impairment, so stated: Secondary | ICD-10-CM

## 2016-04-06 NOTE — Telephone Encounter (Signed)
Patient needs a new order for a MRI patient phone number is [phone]

## 2016-04-07 NOTE — Telephone Encounter (Signed)
Clld pt  - pt was sleeping - spoke to his wife - advsd MRI orders have been put in and GI has been advsd as well. They will be receiving a call from GI to schedule appt.  Mrs. Lindell stated she understood and had no other questions.   Spoke to [place] - she will be cllng pt to schedule MRI.

## 2016-04-07 NOTE — Telephone Encounter (Signed)
Put in new order for MRI Brain W/WO contrast.

## 2016-04-09 ENCOUNTER — Other Ambulatory Visit: Payer: Self-pay | Admitting: Cardiovascular Disease

## 2016-04-09 DIAGNOSIS — I5032 Chronic diastolic (congestive) heart failure: Secondary | ICD-10-CM

## 2016-04-16 ENCOUNTER — Inpatient Hospital Stay (HOSPITAL_COMMUNITY)
Admission: RE | Admit: 2016-04-16 | Payer: BLUE CROSS/BLUE SHIELD | Source: Ambulatory Visit | Admitting: Internal Medicine

## 2016-04-17 ENCOUNTER — Ambulatory Visit
Admission: RE | Admit: 2016-04-17 | Discharge: 2016-04-17 | Disposition: A | Discharge status: Home / Self Care | Payer: BLUE CROSS/BLUE SHIELD | Source: Ambulatory Visit | Attending: Neurology | Admitting: Neurology

## 2016-04-17 MED ORDER — GADOBENATE DIMEGLUMINE 529 MG/ML IV SOLN
10.0000 mL | Freq: Once | INTRAVENOUS | Status: AC | PRN
  Administered 2016-04-17: 10 mL via INTRAVENOUS

## 2016-05-03 ENCOUNTER — Telehealth: Payer: Self-pay | Admitting: Neurology

## 2016-05-03 ENCOUNTER — Ambulatory Visit: Payer: BLUE CROSS/BLUE SHIELD | Admitting: Neurology

## 2016-05-03 NOTE — Telephone Encounter (Signed)
Caller: [name] spouse [name]  Urgent? No  Reason for the call: Decreased mobility, trouble walking

## 2016-05-04 ENCOUNTER — Encounter: Payer: Self-pay | Admitting: Neurology

## 2016-05-04 NOTE — Telephone Encounter (Signed)
Ret cll of pt's wife darlene - unable to leave message on home phone.   LMOVM to make sure pt keeps 07/26/16 appt. Also, advsd to have pt seen at nearest ER if she feels his decrease in mobility or trouble walking worsens, he falls or anything life threatening.

## 2016-05-10 ENCOUNTER — Other Ambulatory Visit: Payer: Self-pay | Admitting: Cardiovascular Disease

## 2016-05-19 ENCOUNTER — Ambulatory Visit (INDEPENDENT_AMBULATORY_CARE_PROVIDER_SITE_OTHER): Payer: BLUE CROSS/BLUE SHIELD | Admitting: Physician Assistant

## 2016-05-19 ENCOUNTER — Ambulatory Visit (INDEPENDENT_AMBULATORY_CARE_PROVIDER_SITE_OTHER): Payer: Medicare Other | Admitting: Pharmacist

## 2016-05-19 ENCOUNTER — Encounter: Payer: Self-pay | Admitting: Physician Assistant

## 2016-05-19 VITALS — BP 112/78 | HR 99 | Ht 70.0 in | Wt 247.0 lb

## 2016-05-19 DIAGNOSIS — Z5181 Encounter for therapeutic drug level monitoring: Secondary | ICD-10-CM

## 2016-05-19 DIAGNOSIS — J9601 Acute respiratory failure with hypoxia: Secondary | ICD-10-CM | POA: Diagnosis not present

## 2016-05-19 DIAGNOSIS — I2782 Chronic pulmonary embolism: Secondary | ICD-10-CM | POA: Diagnosis not present

## 2016-05-19 DIAGNOSIS — N183 Chronic kidney disease, stage 3 unspecified: Secondary | ICD-10-CM

## 2016-05-19 DIAGNOSIS — I5033 Acute on chronic diastolic (congestive) heart failure: Secondary | ICD-10-CM

## 2016-05-19 DIAGNOSIS — R0602 Shortness of breath: Secondary | ICD-10-CM | POA: Diagnosis not present

## 2016-05-19 DIAGNOSIS — I2510 Atherosclerotic heart disease of native coronary artery without angina pectoris: Secondary | ICD-10-CM

## 2016-05-19 DIAGNOSIS — Z86711 Personal history of pulmonary embolism: Secondary | ICD-10-CM

## 2016-05-19 DIAGNOSIS — Z7901 Long term (current) use of anticoagulants: Secondary | ICD-10-CM

## 2016-05-19 DIAGNOSIS — Z9861 Coronary angioplasty status: Secondary | ICD-10-CM

## 2016-05-19 LAB — POCT INR: INR: 3.3

## 2016-05-19 MED ORDER — FUROSEMIDE 40 MG PO TABS: 40.0000 mg | ORAL_TABLET | Freq: Two times a day (BID) | ORAL | 3 refills | Status: DC

## 2016-05-19 NOTE — Patient Instructions (Signed)
Medication Instructions:  Your physician has recommended you make the following change in your medication: start taking LASIX 40 mg twice a day.   Labwork: Your physician recommends that you return for lab work today for BNP, BMET, CBC   Testing/Procedures:  VQ scan to rule out pulmonary embolism.      Follow-Up:   Any Other Special Instructions Will Be Listed Below (If Applicable).     If you need a refill on your cardiac medications before your next appointment, please call your pharmacy.

## 2016-05-19 NOTE — Progress Notes (Signed)
Cardiology Office Note    Date:  05/19/2016   ID:  VICTORIA EUCEDA, DOB 02/01/1940, MRN [id number]  PCP:  Melinda Crutch, MD  Cardiologist:  [name] CHF: Dr. Haroldine Laws (seen once 10/2015)  Chief Complaint: 6 months follow up for CAD and CHF  History of Present Illness:   [name] is a 76 y.o. 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   In 04/2014 he was diagnosed with bilateral submassive pulmonary emboli. He was seen by GI given prior history of GI bleeding and an EGD was performed which showed gastric erosions without evidence of active bleeding. PPI therapy was prescribed. He was started on coumadin and bridged with Lovenox.   He continued to have severe dyspnea on exertion. Pulmonary stress testing revealed mild functional impairment suggestive of a circulatory issue. Right and left heart catheterization revealed significant 2 vessel CAD and he underwent PCI/DES to the LAD and PDA 07/2014. He was put on aspirin, Plavix and Coumadin for one month followed by only Plavix and Coumadin. In 03/2015 he was found to be anemic and was admitted for transfusion. His Plavix was discontinued. He sees hematology/oncology and no GI source was identified. 2-D echo 03/2015 EF 60-65% with grade 1 DD and dilated aortic root of 44 mm.  Seen by Dr. Haroldine Laws for consultation of worsening shortness of breath. No improvement on increased dose of diuretics. Patient underwent left and right cardiac catheterization 11/24/15 for further evaluation which mild nonobstructive CAD with patent stent in RCA and LAD. Very mildly elevated right heart pressures with normal cardiac output. No cardiac source of his severe dyspnea was identified.  Here today for follow up. No INR check since 11/2015. No regular follow up. States he  missed appointment due to being sick with flu. He has chronic constant dyspnea that has worsened in past 3 weeks. Has LE edema. No exacerbation of symptoms while laying down. The patient denies nausea, vomiting, fever, chest pain, palpitations, dizziness, syncope, cough, congestion, abdominal pain, hematochezia, melena, lower extremity edema. Currently takes lasix 20mg  BID. Has loss of appetite.   Past Medical History:  Diagnosis Date  . Acute lower GI bleeding    a. admitted to [facility] 04/11/2013;  b. 04/2014 EGD: 1.  gastric erosions, mild prox gastritis and bulbar duodenitis->Protonix.  . Addison's disease ([place])   . Anemia   . Anemia in CKD (chronic kidney disease) 06/25/2015  . Chronic back pain   . Chronic diastolic CHF (congestive heart failure) ([place])    a. His initial ejection fraction was between 30 and 35%;  b. 04/2013 Echo: EF 50-55% Gr1 DD, mild-mod MR;  c. 04/2014 Echo: EF 60-65%, Gr 1 DD, triv TR.  . CKD (chronic kidney disease), stage III   . Coronary artery disease    a. 07/2014 - s/p difficult PCI - had pressure wire analysis of LAD s/p DES in mid segment c/b wire-induced dissection in the distal segment of the LAD which was treated with repeated balloon inflations. Also had DES to rPDA - again had either spasm distal to the stent placement or an edge dissection but the vessel was small in that area; procedure aborted.  . Daily headache   . DVT (deep venous thrombosis) ([place])    a. 02/2013 superficial thrombophlebitis --> Xarelto later d/c'd 2/2 GIB;  b. 04/2014 Acute Right PT, Peroneal, Popliteal, Femoral, and common femoral DVT-->coumadin  . GERD (gastroesophageal reflux disease)   . H/O hiatal hernia   . Hepatitis    a. 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  . History of aortic insufficiency   . Hx of cardiovascular stress test    a. ETT-Myoview 7/14:  Low risk, inferior defect consistent with thinning, no ischemia, normal wall motion, EF 54%  . Hypertension   . Hypogonadism male   . Obesity   . On home  oxygen therapy    a. added 04/2014 in setting of PE.  Marland Kitchen Peptic ulcer disease   . Pulmonary embolism ([place])    a. 04/2014 CTA: Bilat submassive PE ->coumadin.  . Pulmonary HTN ([place])   . Thyroid disease     Past Surgical History:  Procedure Laterality Date  . APPENDECTOMY  1950's  . CARDIAC CATHETERIZATION  06/26/2008   EF 30-35%  . CARDIAC CATHETERIZATION N/A 08/20/2014   Procedure: Right/Left Heart Cath and Coronary Angiography;  Surgeon: Jolaine Artist, MD;  Location: [place] [facility];  Service: Cardiovascular;  Laterality: N/A;  . CARDIAC CATHETERIZATION N/A 08/21/2014   Procedure: Coronary Stent Intervention;  Surgeon: Wellington Hampshire, MD;  Location: [place] [facility];  Service: Cardiovascular;  Laterality: N/A;  . CARDIAC CATHETERIZATION N/A 11/24/2015   Procedure: Right/Left Heart Cath and Coronary Angiography;  Surgeon: Jolaine Artist, MD;  Location: [place] [facility];  Service: Cardiovascular;  Laterality: N/A;  . CHOLECYSTECTOMY  ~ 1965  . ESOPHAGOGASTRODUODENOSCOPY N/A 04/11/2013   Procedure: ESOPHAGOGASTRODUODENOSCOPY (EGD);  Surgeon: Missy Sabins, MD;  Location: [facility]COPY;  Service: Endoscopy;  Laterality: N/A;  . ESOPHAGOGASTRODUODENOSCOPY N/A 05/13/2014   Procedure: ESOPHAGOGASTRODUODENOSCOPY (EGD);  Surgeon: Arta Silence, MD;  Location: [facility] ENDOSCOPY;  Service: Endoscopy;  Laterality: N/A;  . TONSILLECTOMY  1940's  . US ECHOCARDIOGRAPHY  04/09/2010   EF 60-65%    Current Medications: Prior to Admission medications   Medication Sig Start Date End Date Taking? Authorizing Provider  atorvastatin (LIPITOR) 80 MG tablet TAKE 1 TABLET (80 MG TOTAL) BY MOUTH DAILY. 11/24/15   Thayer Headings, MD  carvedilol (COREG) 6.25 MG tablet TAKE 1 TABLET TWICE A DAY WITH MEALS 11/12/15   Thayer Headings, MD  Cholecalciferol (VITAMIN D3 PO) Take 1,000 capsules by mouth daily.     Historical Provider, MD  ezetimibe (ZETIA) 10 MG tablet Take 1 tablet (10 mg total) by mouth daily.  02/28/15   Thayer Headings, MD  fenofibrate (TRICOR) 48 MG tablet TAKE 1 TABLET (48 MG TOTAL) BY MOUTH DAILY. 10/20/15   Thayer Headings, MD  ferrous sulfate 325 (65 FE) MG tablet Take 1 tablet (325 mg total) by mouth daily with breakfast. 04/17/15   Florencia Reasons, MD  folic acid (FOLVITE) 1 MG tablet Take 1 tablet (1 mg total) by mouth daily. 04/17/15   Florencia Reasons, MD  furosemide (LASIX) 40 MG tablet Take 1 1/2 tablets by mouth twice a day X's 3 days then go back to 1 tablet by mouth twice a day 10/23/15   Leanor Kail, PA  gabapentin (NEURONTIN) 600 MG tablet Take 600 mg by mouth daily.    Historical Provider, MD  hydrocortisone (CORTEF) 20 MG tablet Take 20 mg by mouth 3 (three) times daily.     Historical Provider, MD  isosorbide mononitrate (IMDUR) 30 MG 24 hr tablet Take 0.5 tablets (15 mg total) by mouth daily. 09/05/14   Liliane Shi, PA-C  isosorbide mononitrate (IMDUR) 30 MG 24 hr tablet TAKE 1 TABLET (30 MG TOTAL) BY MOUTH DAILY. 03/31/16   Thayer Headings, MD  lisinopril (PRINIVIL,ZESTRIL) 10 MG tablet Take 0.5 tablets (5 mg total) by mouth daily. 05/14/15   Thayer Headings, MD  lisinopril (PRINIVIL,ZESTRIL) 10 MG tablet Take 0.5 tablets (5 mg total) by mouth daily. 05/10/16   Thayer Headings, MD  metoCLOPramide (REGLAN) 5 MG tablet Take 5 mg by mouth daily.     Historical Provider, MD  nitroGLYCERIN (NITROSTAT) 0.4 MG SL tablet PLACE 1 TABLET UNDER TONGUE EVERY 5 MINUTES AS NEEDED FOR CHEST PAIN (UP TO 3 DOSES) 03/22/16   Thayer Headings, MD  pantoprazole (PROTONIX) 40 MG tablet Take 1 tablet (40 mg total) by mouth daily. 05/13/14   Donne Hazel, MD  potassium chloride (K-DUR) 10 MEQ tablet TAKE 1 TABLET EVERY OTHER DAY 04/09/16   Thayer Headings, MD  potassium chloride SA (K-DUR,KLOR-CON) 20 MEQ tablet TAKE 1 1/2 TABLETS BY MOUTH DAILY X'S 3 DAYS THEN GO BACK TO 1 TABLET BY MOUTH DAILY 10/23/15   Candice Lunney, PA  PROAIR HFA 108 (90 Base) MCG/ACT inhaler INHALE 2 PUFFS INTO THE LUNGS EVERY 6  (SIX) HOURS AS NEEDED FOR WHEEZING OR SHORTNESS OF BREATH. 07/08/15   Collene Gobble, MD  promethazine (PHENERGAN) 25 MG tablet Take 25 mg by mouth every 6 (six) hours as needed for nausea.  05/17/14   Historical Provider, MD  thyroid (ARMOUR) 60 MG tablet Take 60 mg by mouth daily.     Historical Provider, MD  traMADol (ULTRAM) 50 MG tablet Take 100 mg by mouth 3 (three) times daily.     Historical Provider, MD  traZODone (DESYREL) 100 MG tablet Take 100 mg by mouth every evening.  04/16/14   Historical Provider, MD  warfarin (COUMADIN) 3 MG tablet Please take 6mg  on 3/24, then go back to home dose 3mg  every day except Monday and Friday . On Monday and Friday take 1.5mg . Have INR checked on Monday 3/27. 04/18/15   Florencia Reasons, MD  warfarin (COUMADIN) 3 MG tablet TAKE AS DIRECTED BY COUMADIN CLINIC 03/12/16   Collene Gobble, MD    Allergies:   Patient has no known allergies.   Social History   Social History  . Marital status: Married    Spouse name: N/A  . Number of children: N/A  . Years of education: N/A   Occupational History  . retired    Social History Main Topics  . Smoking status: Never Smoker  . Smokeless tobacco: Never Used  . Alcohol use No  . Drug use: No  . Sexual activity: Not Asked   Other Topics Concern  . None   Social History Narrative  . None     Family History:  The patient's family history includes Clotting disorder in his brother, brother, brother, mother, and other.   ROS:   Please see the history of present illness.    ROS All other systems reviewed and are negative.   PHYSICAL EXAM:   VS:  BP 112/78   Pulse 99   Ht 5\' 10"  (1.778 m)   Wt 247 lb (112 kg)   SpO2 94%   BMI 35.44 kg/m    GEN: Well nourished, well developed, in mild distress  HEENT: normal  Neck: no  carotid bruits, or masses. JVD difficult to assess due to beard Cardiac: RRR; no murmurs, rubs, or gallops, 2 + BL LE  edema  Respiratory:  In distress. Faint rales GI: soft, nontender,  nondistended, + BS MS: no deformity or atrophy  Skin: warm and dry, no rash Neuro:  Alert and Oriented x 3, Strength and sensation are intact Psych: euthymic mood, full affect  Wt Readings from Last 3 Encounters:  05/19/16 247 lb (112 kg)  11/24/15 240 lb (108.9 kg)  11/21/15 244 lb 12 oz (111 kg)      Studies/Labs Reviewed:   EKG:  EKG is ordered today.  The ekg ordered today demonstrates NSR  Recent Labs: 06/04/2015: ALT 18 06/09/2015: Brain Natriuretic Peptide 23.5 11/24/2015: BUN 26; Creatinine, Ser 1.80; Hemoglobin 13.1; Platelets 318; Potassium 3.6; Sodium 140   Lipid Panel    Component Value Date/Time   CHOL 177 02/27/2015 1256   TRIG 223 (H) 02/27/2015 1256   HDL 41 02/27/2015 1256   CHOLHDL 4.3 02/27/2015 1256   VLDL 45 (H) 02/27/2015 1256   LDLCALC 91 02/27/2015 1256   LDLDIRECT 58 06/04/2015 1450    Additional studies/ records that were reviewed today include:   Echocardiogram: 04/17/15 Study Conclusions  - Left ventricle: The cavity size was normal. Wall thickness was   increased in a pattern of moderate LVH. Systolic function was   normal. The estimated ejection fraction was in the range of 60%   to 65%. Wall motion was normal; there were no regional wall   motion abnormalities. Doppler parameters are consistent with   abnormal left ventricular relaxation (grade 1 diastolic   dysfunction). The E/e&' ratio is between 8-15, suggesting   indeterminate LV filling pressure. - Aortic valve: Trileaflet. Sclerosis without stenosis. There was   trivial regurgitation. - Aorta: Aortic root dimension: 44 mm (ED). - Aortic root: The aortic root is dilated. - Ascending aorta: The ascending aorta was normal in size. - Mitral valve: Calcified annulus. There was trivial regurgitation. - Left atrium: Moderately dilated at 40 ml/m2. - Tricuspid valve: There was no significant regurgitation. - Inferior vena cava: The vessel was normal in size. The   respirophasic  diameter changes were in the normal range (= 50%),   consistent with normal central venous pressure.  Impressions:  - Compared to a prior study in 2016, there are few changes. The   aortic root measures 4.4 cm. The ascending aorta measures up to 4   cm at the most distal visualized point.   Right/Left Heart Cath and Coronary Angiography 11/24/15 Conclusion     Mid RCA lesion, 30 %stenosed.  RPDA-1 lesion, 40 %stenosed.  RPDA-2 lesion, 0 %stenosed.  Dist LAD-2 lesion, 30 %stenosed.  Dist LAD-1 lesion, 0 %stenosed.   Findings:  AO = 146/82 (109) LV = 128/13 RA = 11 RV = 35/11 PA = 33/10 (22) PCW = 18 Fick cardiac output/index = 5.0/2.2 Thermo CO/CI = 6.4/2.8 PVR = < 1.0 WU FA sat = 96% PA sat = 62%, 61%  Assessment: 1. Mild non obstructive CAD with patent RCA and LAD stents 2. Very mildly elevated right heart pressures with normal cardiac output  Plan/Discussion:  No clear cardiac source for his severe dyspnea.        ASSESSMENT & PLAN:   1. Acute hypoxia with chronic dyspnea  - Worsening of shortness of breath for the past 3 weeks. It been constant which exacerbates with any activity. Not worsen with laying down. Hx of DVT/PE. He hasn't checked his INR since 10/2015. Worry some for PE. No bleeding issue. - His O2 stat drop down to 83% with ambulation which  improved to 95% on @L  oxygen. He felt much better on oxygen. Will go home on 2L oxygen.  - Discussed with [name]. Will get VQ scan. Coumadin per pharmacy today. Check BNP. BMP and CBC. Increase lasix to 40mg  BID. Follow up and length pending labs. - F/u with Dr. Haroldine Laws and pulmonologist next month as schedule.   2. CAD s/p DES to dLAD and rPDA (02/2014) - Last cath 10/2015 showed mild non obstructive CAD with patent RCA and LAD stents.   3. Acute on chronic diastolic CHF - Normal LV function 03/2015. R heart cath 10/2015 Very mildly elevated right heart pressures with normal cardiac  output - As above  4. CKD, stage III -baseline Scr around 2. Followed by nephrologist at [facility]   5. Hx of DVT/bilateral PE - On coumadin. Dr. Haroldine Laws recommended VQ scan in Oct, 2017 and changes to Eliquis 2.5mg  BID for long-term coverage per [employer]-EXT trial, however, both never done.  - AS above  6.   Obesity and deconditioning - Discussed need for weight loss, heart healthy diet and exercise program   I have spent over 45 minutes, face-to-face with the patient and over 50% was spent in counseling and/or coordination of care. This includes review of prior records, care discussion with [name], need of magine agent, developing & discussing different plans, discussion of disease and its complications, life style changes.   Medication Adjustments/Labs and Tests Ordered: Current medicines are reviewed at length with the patient today.  Concerns regarding medicines are outlined above.  Medication changes, Labs and Tests ordered today are listed in the Patient Instructions below. Patient Instructions  Medication Instructions:  Your physician has recommended you make the following change in your medication: start taking LASIX 40 mg twice a day.   Labwork: Your physician recommends that you return for lab work today for BNP, BMET, CBC   Testing/Procedures:  VQ scan to rule out pulmonary embolism.      Follow-Up:   Any Other Special Instructions Will Be Listed Below (If Applicable).     If you need a refill on your cardiac medications before your next appointment, please call your pharmacy.      Jarrett Soho, [place]  05/19/2016 3:13 PM    [facility] [address], [place], [place]  [address] Phone: [phone]; Fax: [phone]

## 2016-05-20 ENCOUNTER — Telehealth: Payer: Self-pay | Admitting: *Deleted

## 2016-05-20 ENCOUNTER — Other Ambulatory Visit: Payer: Self-pay | Admitting: *Deleted

## 2016-05-20 DIAGNOSIS — R0602 Shortness of breath: Secondary | ICD-10-CM

## 2016-05-20 LAB — BASIC METABOLIC PANEL
BUN/Creatinine Ratio: 11 (ref 10–24)
BUN: 17 mg/dL (ref 8–27)
CALCIUM: 8.9 mg/dL (ref 8.6–10.2)
CO2: 25 mmol/L (ref 18–29)
CREATININE: 1.6 mg/dL — AB (ref 0.76–1.27)
Chloride: 100 mmol/L (ref 96–106)
GFR, EST AFRICAN AMERICAN: 48 mL/min/{1.73_m2} — AB (ref >59)
GFR, EST NON AFRICAN AMERICAN: 42 mL/min/{1.73_m2} — AB (ref >59)
Glucose: 115 mg/dL — ABNORMAL HIGH (ref 65–99)
POTASSIUM: 4 mmol/L (ref 3.5–5.2)
Sodium: 143 mmol/L (ref 134–144)

## 2016-05-20 LAB — CBC
HEMATOCRIT: 37.8 % (ref 37.5–51.0)
HEMOGLOBIN: 12.6 g/dL — AB (ref 13.0–17.7)
MCH: 30.1 pg (ref 26.6–33.0)
MCHC: 33.3 g/dL (ref 31.5–35.7)
MCV: 90 fL (ref 79–97)
Platelets: 347 10*3/uL (ref 150–379)
RBC: 4.18 x10E6/uL (ref 4.14–5.80)
RDW: 16 % — ABNORMAL HIGH (ref 12.3–15.4)
WBC: 12 10*3/uL — ABNORMAL HIGH (ref 3.4–10.8)

## 2016-05-20 LAB — PRO B NATRIURETIC PEPTIDE: NT-Pro BNP: 40 pg/mL (ref 0–486)

## 2016-05-20 MED ORDER — FUROSEMIDE 20 MG PO TABS: 20.0000 mg | ORAL_TABLET | Freq: Two times a day (BID) | ORAL | 3 refills | Status: DC

## 2016-05-20 NOTE — Telephone Encounter (Signed)
-----   Message from [place], [place] sent at 05/20/2016  8:15 AM EDT ----- Fluid marker normal. Reduce lasix to previous dose tomorrow. Scr and hemoglobin stable. His dyspnea is chronic, however, new hypoxia is likely due to pulmonary issue. Advised to follow up with PCP early next week (please forwards all labs).  F/u in CHF clinic and pulmonary as schedule next month. Elevate legs.

## 2016-05-21 ENCOUNTER — Ambulatory Visit (HOSPITAL_COMMUNITY): Admission: RE | Admit: 2016-05-21 | Payer: Medicare Other | Source: Ambulatory Visit

## 2016-05-21 ENCOUNTER — Encounter (HOSPITAL_COMMUNITY): Admission: RE | Admit: 2016-05-21 | Payer: Medicare Other | Source: Ambulatory Visit

## 2016-05-21 ENCOUNTER — Telehealth: Payer: Self-pay | Admitting: Physician Assistant

## 2016-05-21 NOTE — Telephone Encounter (Signed)
New message       Pt is scheduled to have a test at the hosp today.  Wife cannot take patient and would like to resc until next week.  Can she call the hosp and resc this test herself or co we resc this test.  Please call

## 2016-05-25 ENCOUNTER — Other Ambulatory Visit: Payer: Self-pay | Admitting: *Deleted

## 2016-05-25 MED ORDER — FUROSEMIDE 40 MG PO TABS: 40.0000 mg | ORAL_TABLET | Freq: Two times a day (BID) | ORAL | 1 refills | Status: DC

## 2016-05-26 ENCOUNTER — Ambulatory Visit (HOSPITAL_COMMUNITY): Payer: Medicare Other | Attending: Physician Assistant

## 2016-05-31 ENCOUNTER — Encounter (HOSPITAL_COMMUNITY)
Admission: RE | Admit: 2016-05-31 | Discharge: 2016-05-31 | Disposition: A | Discharge status: Home / Self Care | Payer: BLUE CROSS/BLUE SHIELD | Source: Ambulatory Visit | Attending: Physician Assistant | Admitting: Physician Assistant

## 2016-05-31 ENCOUNTER — Ambulatory Visit (HOSPITAL_COMMUNITY)
Admission: RE | Admit: 2016-05-31 | Discharge: 2016-05-31 | Disposition: A | Discharge status: Home / Self Care | Payer: BLUE CROSS/BLUE SHIELD | Source: Ambulatory Visit | Attending: Physician Assistant | Admitting: Physician Assistant

## 2016-05-31 DIAGNOSIS — J449 Chronic obstructive pulmonary disease, unspecified: Secondary | ICD-10-CM | POA: Diagnosis not present

## 2016-05-31 DIAGNOSIS — R0602 Shortness of breath: Secondary | ICD-10-CM | POA: Insufficient documentation

## 2016-05-31 DIAGNOSIS — I700 Atherosclerosis of aorta: Secondary | ICD-10-CM | POA: Insufficient documentation

## 2016-05-31 MED ORDER — TECHNETIUM TO 99M ALBUMIN AGGREGATED
4.1800 | Freq: Once | INTRAVENOUS | Status: AC | PRN
  Administered 2016-05-31: 4.18 via INTRAVENOUS

## 2016-05-31 MED ORDER — TECHNETIUM TC 99M DIETHYLENETRIAME-PENTAACETIC ACID
32.8000 | Freq: Once | INTRAVENOUS | Status: AC | PRN
  Administered 2016-05-31: 32.8 via RESPIRATORY_TRACT

## 2016-06-02 ENCOUNTER — Telehealth: Payer: Self-pay

## 2016-06-02 NOTE — Telephone Encounter (Signed)
Left message with patients wife to have patient call back for test results. She is agreeable.

## 2016-06-02 NOTE — Addendum Note (Signed)
Addended by: Orson Gear on: 06/02/2016 11:46 AM   Modules accepted: Orders

## 2016-06-11 ENCOUNTER — Institutional Professional Consult (permissible substitution): Payer: BLUE CROSS/BLUE SHIELD | Admitting: Pulmonary Disease

## 2016-06-17 ENCOUNTER — Encounter (HOSPITAL_COMMUNITY): Payer: BLUE CROSS/BLUE SHIELD | Admitting: Internal Medicine

## 2016-07-12 ENCOUNTER — Other Ambulatory Visit: Payer: Self-pay | Admitting: Emergency Medicine

## 2016-07-23 ENCOUNTER — Other Ambulatory Visit: Payer: Self-pay | Admitting: Emergency Medicine

## 2016-07-25 ENCOUNTER — Other Ambulatory Visit: Payer: Self-pay | Admitting: Cardiovascular Disease

## 2016-07-26 ENCOUNTER — Ambulatory Visit (INDEPENDENT_AMBULATORY_CARE_PROVIDER_SITE_OTHER): Payer: BLUE CROSS/BLUE SHIELD | Admitting: Neurology

## 2016-07-26 ENCOUNTER — Other Ambulatory Visit: Payer: Medicare Other

## 2016-07-26 ENCOUNTER — Encounter: Payer: Self-pay | Admitting: Neurology

## 2016-07-26 VITALS — BP 116/70 | HR 95 | Ht 70.0 in | Wt 238.0 lb

## 2016-07-26 DIAGNOSIS — G629 Polyneuropathy, unspecified: Secondary | ICD-10-CM | POA: Insufficient documentation

## 2016-07-26 DIAGNOSIS — R202 Paresthesia of skin: Secondary | ICD-10-CM

## 2016-07-26 DIAGNOSIS — F329 Major depressive disorder, single episode, unspecified: Secondary | ICD-10-CM | POA: Insufficient documentation

## 2016-07-26 DIAGNOSIS — G3184 Mild cognitive impairment, so stated: Secondary | ICD-10-CM

## 2016-07-26 DIAGNOSIS — R200 Anesthesia of skin: Secondary | ICD-10-CM

## 2016-07-26 DIAGNOSIS — F32A Depression, unspecified: Secondary | ICD-10-CM | POA: Insufficient documentation

## 2016-07-26 LAB — TSH: TSH: 1.85 m[IU]/L (ref 0.40–4.50)

## 2016-07-26 MED ORDER — ESCITALOPRAM OXALATE 10 MG PO TABS: 10.0000 mg | ORAL_TABLET | Freq: Every day | ORAL | 11 refills | Status: DC

## 2016-07-26 NOTE — Patient Instructions (Addendum)
1. Bloodwork for TSH, B12, SPEP/IFE, ESR 2. Schedule MRI lumbar spine without contrast 3. Schedule EMG/NCV of both LE with Dr. Posey Pronto 4. Refer to PT for Balance Therapy 5. Start Lexapro 72m daily. Call our office in 3 months and we will plan to start Aricept at that time 6. Control of blood pressure, cholesterol, as well as physical exercise and brain stimulation exercises are important for brain health 7. Follow-up in 6 months, call for any changes  [facility] and [facility] will call you directly to schedule your appt. They are located at [address] If you need to contact them directly please call [phone].  Your provider has requested that you have labwork completed today. [id number] on the second floor of this building before leaving the office today. You do not need to check in. If you are not called within 15 minutes please check with the front desk.
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
## 2016-07-27 ENCOUNTER — Other Ambulatory Visit: Payer: Self-pay

## 2016-07-27 ENCOUNTER — Ambulatory Visit (INDEPENDENT_AMBULATORY_CARE_PROVIDER_SITE_OTHER): Payer: BLUE CROSS/BLUE SHIELD | Admitting: *Deleted

## 2016-07-27 DIAGNOSIS — G629 Polyneuropathy, unspecified: Secondary | ICD-10-CM

## 2016-07-27 DIAGNOSIS — Z5181 Encounter for therapeutic drug level monitoring: Secondary | ICD-10-CM | POA: Diagnosis not present

## 2016-07-27 LAB — POCT INR: INR: 2

## 2016-07-27 LAB — VITAMIN B12: VITAMIN B 12: 664 pg/mL (ref 200–1100)

## 2016-07-27 LAB — SEDIMENTATION RATE: SED RATE: 12 mm/h (ref 0–20)

## 2016-07-27 MED ORDER — WARFARIN SODIUM 3 MG PO TABS: ORAL_TABLET | ORAL | 0 refills | Status: DC

## 2016-07-29 ENCOUNTER — Telehealth: Payer: Self-pay | Admitting: Neurology

## 2016-07-29 NOTE — Telephone Encounter (Signed)
[name] wife called in regards to the lexapro, the pharmacy called her and said it could cause bleeding when taking with the warfarin

## 2016-07-29 NOTE — Telephone Encounter (Signed)
Please advise 

## 2016-07-30 LAB — PROTEIN ELECTROPHORESIS, SERUM
ALBUMIN ELP: 3.5 g/dL — AB (ref 3.8–4.8)
ALPHA-1-GLOBULIN: 0.3 g/dL (ref 0.2–0.3)
ALPHA-2-GLOBULIN: 0.9 g/dL (ref 0.5–0.9)
BETA 2: 0.3 g/dL (ref 0.2–0.5)
Beta Globulin: 0.4 g/dL (ref 0.4–0.6)
GAMMA GLOBULIN: 0.8 g/dL (ref 0.8–1.7)
TOTAL PROTEIN, SERUM ELECTROPHOR: 6.3 g/dL (ref 6.1–8.1)

## 2016-07-30 LAB — IMMUNOFIXATION ELECTROPHORESIS
IGM, SERUM: 130 mg/dL (ref 48–271)
IgA: 233 mg/dL (ref 81–463)
IgG (Immunoglobin G), Serum: 768 mg/dL (ref 694–1618)

## 2016-07-30 MED ORDER — BUPROPION HCL ER (XL) 150 MG PO TB24: 150.0000 mg | ORAL_TABLET | Freq: Every day | ORAL | 6 refills | Status: DC

## 2016-07-30 NOTE — Telephone Encounter (Signed)
PT wife called again today checking on the status of what she needs to do about the lexapro  medication that is not to be mixed with the blood thinner warfin

## 2016-07-30 NOTE — Telephone Encounter (Signed)
Attempted to contact pt's wife again.  No answer, no answering machine/voicemail.

## 2016-07-30 NOTE — Telephone Encounter (Signed)
Returned pt's wife call to relay message below.  No answer, no answering machine/voicemail.

## 2016-07-30 NOTE — Telephone Encounter (Signed)
Pls let wife know it is a low risk of bleeding, but we can use a different antidepressant that does not have similar reactions. I have sent in a prescription for Wellbutrin ER 150mg  once a day, some people may feel a little dizzy or nausea when first starting medication. Thanks

## 2016-08-02 ENCOUNTER — Other Ambulatory Visit: Payer: Self-pay | Admitting: Neurology

## 2016-08-02 NOTE — Telephone Encounter (Signed)
Spoke with pt wife, relaying message below.  She was appreciative.

## 2016-08-04 ENCOUNTER — Other Ambulatory Visit: Payer: Self-pay | Admitting: Neurology

## 2016-08-05 ENCOUNTER — Telehealth: Payer: Self-pay

## 2016-08-05 NOTE — Telephone Encounter (Signed)
-----   Message from Cameron Sprang, MD sent at 08/05/2016  4:01 PM EDT ----- Pls let him know bloodwork is normal, thanks

## 2016-08-05 NOTE — Telephone Encounter (Signed)
Called pt to relay message below.  Phone rang, sounded like someone answered, but then hung up.  Could not relay message.

## 2016-08-08 ENCOUNTER — Inpatient Hospital Stay: Admission: RE | Admit: 2016-08-08 | Payer: Medicare Other | Source: Ambulatory Visit

## 2016-08-10 ENCOUNTER — Telehealth (HOSPITAL_COMMUNITY): Payer: Self-pay

## 2016-08-10 NOTE — Telephone Encounter (Signed)
Rescheduled patient's apt per patient request.  Renee Pain, RN

## 2016-08-11 ENCOUNTER — Other Ambulatory Visit: Payer: Medicare Other

## 2016-08-12 NOTE — Telephone Encounter (Signed)
Called pt to relay message below. Phone rang, sounded like someone answered but then hung up.

## 2016-08-12 NOTE — Telephone Encounter (Signed)
-----   Message from Cameron Sprang, MD sent at 08/05/2016  4:01 PM EDT ----- Pls let him know bloodwork is normal, thanks

## 2016-08-20 ENCOUNTER — Ambulatory Visit: Payer: Medicare Other | Attending: Neurology | Admitting: Rehabilitation

## 2016-08-25 ENCOUNTER — Inpatient Hospital Stay: Admission: RE | Admit: 2016-08-25 | Payer: Medicare Other | Source: Ambulatory Visit

## 2016-08-26 ENCOUNTER — Other Ambulatory Visit: Payer: Self-pay | Admitting: Cardiovascular Disease

## 2016-09-01 ENCOUNTER — Ambulatory Visit (INDEPENDENT_AMBULATORY_CARE_PROVIDER_SITE_OTHER): Payer: BLUE CROSS/BLUE SHIELD | Admitting: *Deleted

## 2016-09-01 DIAGNOSIS — Z5181 Encounter for therapeutic drug level monitoring: Secondary | ICD-10-CM

## 2016-09-01 LAB — POCT INR: INR: 1.2

## 2016-09-08 ENCOUNTER — Ambulatory Visit (INDEPENDENT_AMBULATORY_CARE_PROVIDER_SITE_OTHER): Payer: BLUE CROSS/BLUE SHIELD | Admitting: *Deleted

## 2016-09-08 ENCOUNTER — Ambulatory Visit (HOSPITAL_COMMUNITY)
Admission: RE | Admit: 2016-09-08 | Discharge: 2016-09-08 | Disposition: A | Discharge status: Home / Self Care | Payer: BLUE CROSS/BLUE SHIELD | Source: Ambulatory Visit | Attending: Internal Medicine | Admitting: Internal Medicine

## 2016-09-08 VITALS — BP 144/80 | HR 87 | Wt 238.5 lb

## 2016-09-08 DIAGNOSIS — K449 Diaphragmatic hernia without obstruction or gangrene: Secondary | ICD-10-CM | POA: Diagnosis not present

## 2016-09-08 DIAGNOSIS — Z86711 Personal history of pulmonary embolism: Secondary | ICD-10-CM | POA: Insufficient documentation

## 2016-09-08 DIAGNOSIS — I351 Nonrheumatic aortic (valve) insufficiency: Secondary | ICD-10-CM | POA: Insufficient documentation

## 2016-09-08 DIAGNOSIS — D649 Anemia, unspecified: Secondary | ICD-10-CM | POA: Insufficient documentation

## 2016-09-08 DIAGNOSIS — N183 Chronic kidney disease, stage 3 (moderate): Secondary | ICD-10-CM | POA: Diagnosis present

## 2016-09-08 DIAGNOSIS — Z955 Presence of coronary angioplasty implant and graft: Secondary | ICD-10-CM | POA: Insufficient documentation

## 2016-09-08 DIAGNOSIS — I130 Hypertensive heart and chronic kidney disease with heart failure and stage 1 through stage 4 chronic kidney disease, or unspecified chronic kidney disease: Secondary | ICD-10-CM | POA: Diagnosis not present

## 2016-09-08 DIAGNOSIS — I2510 Atherosclerotic heart disease of native coronary artery without angina pectoris: Secondary | ICD-10-CM | POA: Diagnosis not present

## 2016-09-08 DIAGNOSIS — Z5181 Encounter for therapeutic drug level monitoring: Secondary | ICD-10-CM

## 2016-09-08 DIAGNOSIS — K219 Gastro-esophageal reflux disease without esophagitis: Secondary | ICD-10-CM | POA: Diagnosis not present

## 2016-09-08 DIAGNOSIS — Z7952 Long term (current) use of systemic steroids: Secondary | ICD-10-CM | POA: Diagnosis not present

## 2016-09-08 DIAGNOSIS — K279 Peptic ulcer, site unspecified, unspecified as acute or chronic, without hemorrhage or perforation: Secondary | ICD-10-CM | POA: Diagnosis not present

## 2016-09-08 DIAGNOSIS — R0600 Dyspnea, unspecified: Secondary | ICD-10-CM | POA: Diagnosis not present

## 2016-09-08 DIAGNOSIS — Z86718 Personal history of other venous thrombosis and embolism: Secondary | ICD-10-CM | POA: Insufficient documentation

## 2016-09-08 DIAGNOSIS — Z9981 Dependence on supplemental oxygen: Secondary | ICD-10-CM | POA: Insufficient documentation

## 2016-09-08 DIAGNOSIS — G8929 Other chronic pain: Secondary | ICD-10-CM | POA: Insufficient documentation

## 2016-09-08 DIAGNOSIS — I5032 Chronic diastolic (congestive) heart failure: Secondary | ICD-10-CM | POA: Diagnosis present

## 2016-09-08 DIAGNOSIS — D631 Anemia in chronic kidney disease: Secondary | ICD-10-CM | POA: Insufficient documentation

## 2016-09-08 DIAGNOSIS — E079 Disorder of thyroid, unspecified: Secondary | ICD-10-CM | POA: Insufficient documentation

## 2016-09-08 DIAGNOSIS — E669 Obesity, unspecified: Secondary | ICD-10-CM | POA: Diagnosis not present

## 2016-09-08 DIAGNOSIS — I272 Pulmonary hypertension, unspecified: Secondary | ICD-10-CM | POA: Diagnosis not present

## 2016-09-08 DIAGNOSIS — E271 Primary adrenocortical insufficiency: Secondary | ICD-10-CM | POA: Diagnosis not present

## 2016-09-08 LAB — POCT INR: INR: 3

## 2016-09-08 NOTE — Progress Notes (Signed)
ADVANCED HF CLINIC NOTE  Cardiologist: [name]  History of Present Illness:  [name] is a 76 y.o. 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 for further evaluation of his severe, chronic dyspnea.  He has long h/o significant dyspnea. Has been followed by Drs. [name] and [name]. I also saw him a year or two ago for the same problem. In 2010 echo with EF 40-45%. (previously noted to be 30-35%). Echo repeated in 2012 and EF normalized.   He was admitted to hospital on 05/10/14 for worsening SOB and was found to have submassive PEs. He did not meet criteria for TPA.  He was placed on heparin drip and transitioned to coumadin prior to d/c.   Echo with EF 55-60%. RV noted to be normal. In 7/14 normal Myoview.   F/u CT scan of chest in 5/16 was normal except for small pulmonary nodule A cardiopulmonary stress test revealed mild functional impairment suggestive of a circulatory issue. He was set up for left and right heart catheterization which revealed significant 2V CAD. He underwent PCI/DES to dLAD and rPDA in 04/2704 which was complicated by coronary disection with chronic occlusion of the PDA.   In 03/2015 he was seen in the office for fatigue and SOB. He was found to be anemic and admitted for transfusion. He received one unit of blood and also a transfusion of iron.Stool quiac was negative  His plavix was discontinued.  He was scheduled to see a hemotologist / oncologist since there was no evidence of GI source. 2D ECHO showed EF 60%-65%, G1DD and dilated aortic root at 44 mm. Underwent repeat CPX in 3/17 with 40% reduction in pVO2 with moderate circulatory limitation (see report below). F/u cath 10/17 with patent stents and relatively normal pulmonary pressures.  VQ 5/18: no PE  Here today with his wife.  Says he was recently seen by Dr. [name] and diagnosed with neuropathy and also some mild cognitive impairment (MMSE 27/30).   Says he continues to have progressive SOB. Able to do ADLs but if he goes up steps gets SOB. No orthopnea, PND or LE edema. Compliant with meds. Weight stable at 238 pounds. Mild cough.   Studies:  R/L Cath 10/17   Mid RCA lesion, 30 %stenosed.  RPDA-1 lesion, 40 %stenosed.  RPDA-2 lesion, 0 %stenosed.  Dist LAD-2 lesion, 30 %stenosed.  Dist LAD-1 lesion, 0 %stenosed.   Findings:  AO = 146/82 (109) LV = 128/13 RA = 11 RV = 35/11 PA = 33/10 (22) PCW = 18 Fick cardiac output/index = 5.0/2.2 Thermo CO/CI = 6.4/2.8 PVR = < 1.0 WU FA sat = 96% PA sat = 62%, 61%   CPX 07/22/14 FVC 3.75, FEV1 2.37, FEV1/FVC 63 BP rest: 124/70 BP peak: 216/92 Peak VO2: 16.8 (76% predicted peak VO2) - when corrected to ideal weight the peak VO2 is 21.2 ml/kg (ibw)/min (81% of the ibw-adjusted predicted).  VE/VCO2 slope: 37 OUES: 1.8 Peak RER: 1.11 Ventilatory Threshold: 9.2 (41.6% predicted measured peak VO2) VE/MVV: 68% PETCO2 at peak: 31 O2pulse: 12  (80% predicted O2pulse)  CPX 3/17 FVC 3.14 (-6%) 15 mins    FEV1 2.12 (-6%) 15 mins     FEV1/FVC 63 (-6%) 10 mins    Resting HR: 94 Peak HR: 130  (89% age predicted max HR) BP rest: 126/66  BP peak: 182/76 Peak VO2: 11.0 (56.6% predicted peak VO2) VE/VCO2 slope: 38 OUES: 1.17 Peak RER: 1.38 Ventilatory Threshold: 7.2 (37% predicted or measured peak VO2) VE/MVV: 60.6 O2pulse: 9  (64% predicted O2pulse)   Past Medical History:  Diagnosis Date  . Acute lower GI bleeding    a. admitted to [facility] 04/11/2013;  b. 04/2014 EGD: 1.  gastric erosions, mild prox gastritis and bulbar duodenitis->Protonix.  . Addison's disease ([place])   . Anemia   . Anemia in CKD (chronic kidney disease) 06/25/2015  . Chronic back pain   . Chronic diastolic CHF (congestive heart failure) ([place])    a. His initial  ejection fraction was between 30 and 35%;  b. 04/2013 Echo: EF 50-55% Gr1 DD, mild-mod MR;  c. 04/2014 Echo: EF 60-65%, Gr 1 DD, triv TR.  . CKD (chronic kidney disease), stage III   . Coronary artery disease    a. 07/2014 - s/p difficult PCI - had pressure wire analysis of LAD s/p DES in mid segment c/b wire-induced dissection in the distal segment of the LAD which was treated with repeated balloon inflations. Also had DES to rPDA - again had either spasm distal to the stent placement or an edge dissection but the vessel was small in that area; procedure aborted.  . Daily headache   . DVT (deep venous thrombosis) ([place])    a. 02/2013 superficial thrombophlebitis --> Xarelto later d/c'd 2/2 GIB;  b. 04/2014 Acute Right PT, Peroneal, Popliteal, Femoral, and common femoral DVT-->coumadin  . GERD (gastroesophageal reflux disease)   . H/O hiatal hernia   . Hepatitis    a. 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  . History of aortic insufficiency   . Hx of cardiovascular stress test    a. ETT-Myoview 7/14:  Low risk, inferior defect consistent with thinning, no ischemia, normal wall motion, EF 54%  . Hypertension   . Hypogonadism male   . Obesity   . On home oxygen therapy    a. added 04/2014 in setting of PE.  Marland Kitchen Peptic ulcer disease   . Pulmonary embolism ([place])    a. 04/2014 CTA: Bilat submassive PE ->coumadin.  . Pulmonary HTN ([place])   . Thyroid disease     Past Surgical History:  Procedure Laterality Date  . APPENDECTOMY  1950's  . CARDIAC CATHETERIZATION  06/26/2008   EF 30-35%  . CARDIAC CATHETERIZATION N/A 08/20/2014   Procedure: Right/Left Heart Cath and Coronary Angiography;  Surgeon: Jolaine Artist, MD;  Location: [facility];  Service: Cardiovascular;  Laterality: N/A;  . CARDIAC CATHETERIZATION N/A 08/21/2014   Procedure: Coronary Stent Intervention;  Surgeon: Wellington Hampshire, MD;  Location: [facility];  Service: Cardiovascular;  Laterality: N/A;  . CARDIAC CATHETERIZATION N/A 11/24/2015    Procedure: Right/Left Heart Cath and Coronary Angiography;  Surgeon: Jolaine Artist, MD;  Location: [facility];  Service: Cardiovascular;  Laterality: N/A;  . CHOLECYSTECTOMY  ~ 1965  . ESOPHAGOGASTRODUODENOSCOPY N/A 04/11/2013   Procedure: ESOPHAGOGASTRODUODENOSCOPY (EGD);  Surgeon: Missy Sabins, MD;  Location: [facility] ENDOSCOPY;  Service: Endoscopy;  Laterality: N/A;  . ESOPHAGOGASTRODUODENOSCOPY N/A 05/13/2014   Procedure: ESOPHAGOGASTRODUODENOSCOPY (EGD);  Surgeon: Arta Silence, MD;  Location: [facility] ENDOSCOPY;  Service: Endoscopy;  Laterality: N/A;  . TONSILLECTOMY  1940's  . US ECHOCARDIOGRAPHY  04/09/2010   EF 60-65%    Current Medications:  Prior to Admission medications   Medication Sig Start Date End Date Taking? Authorizing Provider  atorvastatin (LIPITOR) 80 MG  tablet Take 1 tablet (80 mg total) by mouth daily. 10/28/14  Yes Thayer Headings, MD  carvedilol (COREG) 6.25 MG tablet TAKE 1 TABLET TWICE A DAY WITH MEALS 11/12/15  Yes Thayer Headings, MD  Cholecalciferol (VITAMIN D3 PO) Take 1,000 capsules by mouth daily.    Yes Historical Provider, MD  ezetimibe (ZETIA) 10 MG tablet Take 1 tablet (10 mg total) by mouth daily. 02/28/15  Yes Thayer Headings, MD  fenofibrate (TRICOR) 48 MG tablet TAKE 1 TABLET (48 MG TOTAL) BY MOUTH DAILY. 10/20/15  Yes Thayer Headings, MD  ferrous sulfate 325 (65 FE) MG tablet Take 1 tablet (325 mg total) by mouth daily with breakfast. 04/17/15  Yes Florencia Reasons, MD  folic acid (FOLVITE) 1 MG tablet Take 1 tablet (1 mg total) by mouth daily. 04/17/15  Yes Florencia Reasons, MD  furosemide (LASIX) 40 MG tablet Take 1 1/2 tablets by mouth twice a day X's 3 days then go back to 1 tablet by mouth twice a day 10/23/15  Yes Bhavinkumar Bhagat, PA  gabapentin (NEURONTIN) 600 MG tablet Take 600 mg by mouth daily.   Yes Historical Provider, MD  hydrocortisone (CORTEF) 20 MG tablet Take 20 mg by mouth 3 (three) times daily.    Yes Historical Provider, MD  isosorbide mononitrate  (IMDUR) 30 MG 24 hr tablet Take 0.5 tablets (15 mg total) by mouth daily. 09/05/14  Yes Scott Joylene Draft, PA-C  lisinopril (PRINIVIL,ZESTRIL) 10 MG tablet Take 0.5 tablets (5 mg total) by mouth daily. 05/14/15  Yes Thayer Headings, MD  metoCLOPramide (REGLAN) 5 MG tablet Take 5 mg by mouth daily.    Yes Historical Provider, MD  pantoprazole (PROTONIX) 40 MG tablet Take 1 tablet (40 mg total) by mouth daily. 05/13/14  Yes Donne Hazel, MD  potassium chloride SA (K-DUR,KLOR-CON) 20 MEQ tablet TAKE 1 1/2 TABLETS BY MOUTH DAILY X'S 3 DAYS THEN GO BACK TO 1 TABLET BY MOUTH DAILY 10/23/15  Yes Bhavinkumar Bhagat, PA  PROAIR HFA 108 (90 Base) MCG/ACT inhaler INHALE 2 PUFFS INTO THE LUNGS EVERY 6 (SIX) HOURS AS NEEDED FOR WHEEZING OR SHORTNESS OF BREATH. 07/08/15  Yes Collene Gobble, MD  thyroid (ARMOUR) 60 MG tablet Take 60 mg by mouth daily.    Yes Historical Provider, MD  traZODone (DESYREL) 100 MG tablet Take 100 mg by mouth every evening.  04/16/14  Yes Historical Provider, MD  warfarin (COUMADIN) 3 MG tablet Please take 6mg  on 3/24, then go back to home dose 3mg  every day except Monday and Friday . On Monday and Friday take 1.5mg . Have INR checked on Monday 3/27. 04/18/15  Yes Florencia Reasons, MD  NITROSTAT 0.4 MG SL tablet PLACE 1 TABLET UNDER TONGUE EVERY 5 MINUTES AS NEEDED FOR CHEST PAIN (UP TO 3 DOSES) Patient not taking: Reported on 11/21/2015 10/29/15   Thayer Headings, MD  promethazine (PHENERGAN) 25 MG tablet Take 25 mg by mouth every 6 (six) hours as needed for nausea.  05/17/14   Historical Provider, MD  traMADol (ULTRAM) 50 MG tablet Take 100 mg by mouth 3 (three) times daily.     Historical Provider, MD     Allergies:   Patient has no known allergies.   Social History   Social History  . Marital status: Married    Spouse name: N/A  . Number of children: N/A  . Years of education: N/A   Occupational History  . retired    Social History Main Topics  .  Smoking status: Never Smoker  . Smokeless  tobacco: Never Used  . Alcohol use No  . Drug use: No  . Sexual activity: Not on file   Other Topics Concern  . Not on file   Social History Narrative  . No narrative on file     Family History:  The patient's family history includes Clotting disorder in his brother, brother, brother, mother, and other.   ROS:   Please see the history of present illness.    ROS All other systems reviewed and are negative.   PHYSICAL EXAM:   VS:  BP (!) 144/80   Pulse 87   Wt 238 lb 8 oz (108.2 kg)   SpO2 94%   BMI 34.22 kg/m    General:  Obese male sitting with wife. No resp difficulty HEENT: normal Neck: supple. No obvious JVD. Carotids 2+ bilat; no bruits. No lymphadenopathy or thryomegaly appreciated. Cor: PMI nondisplaced. Regular rate & rhythm. No rubs, gallops or murmurs. Lungs: clear with mildly decreased air movement no wheeze Abdomen: soft, nontender, nondistended. No hepatosplenomegaly. No bruits or masses. Good bowel sounds. Extremities: no cyanosis, clubbing, rash, edema Neuro: alert & orientedx3, cranial nerves grossly intact. moves all 4 extremities w/o difficulty. Affect pleasant   Wt Readings from Last 3 Encounters:  09/08/16 238 lb 8 oz (108.2 kg)  07/26/16 238 lb (108 kg)  05/19/16 247 lb (112 kg)      Studies/Labs Reviewed:    Recent Labs: 05/19/2016: BUN 17; Creatinine, Ser 1.60; Hemoglobin 12.6; NT-Pro BNP 40; Platelets 347; Potassium 4.0; Sodium 143 07/26/2016: TSH 1.85   Lipid Panel    Component Value Date/Time   CHOL 177 02/27/2015 1256   TRIG 223 (H) 02/27/2015 1256   HDL 41 02/27/2015 1256   CHOLHDL 4.3 02/27/2015 1256   VLDL 45 (H) 02/27/2015 1256   LDLCALC 91 02/27/2015 1256   LDLDIRECT 58 06/04/2015 1450    Additional studies/ records that were reviewed today include:    2D ECHO: 04/17/2015 LV EF: 60% - 65% Study Conclusions - Left ventricle: The cavity size was normal. Wall thickness was  increased in a pattern of moderate LVH.  Systolic function was  normal. The estimated ejection fraction was in the range of 60%  to 65%. Wall motion was normal; there were no regional wall  motion abnormalities. Doppler parameters are consistent with  abnormal left ventricular relaxation (grade 1 diastolic  dysfunction). The E/e&' ratio is between 8-15, suggesting  indeterminate LV filling pressure. - Aortic valve: Trileaflet. Sclerosis without stenosis. There was  trivial regurgitation. - Aorta: Aortic root dimension: 44 mm (ED). - Aortic root: The aortic root is dilated. - Ascending aorta: The ascending aorta was normal in size. - Mitral valve: Calcified annulus. There was trivial regurgitation. - Left atrium: Moderately dilated at 40 ml/m2. - Tricuspid valve: There was no significant regurgitation. - Inferior vena cava: The vessel was normal in size. The  respirophasic diameter changes were in the normal range (= 50%),  consistent with normal central venous pressure. Impressions: - Compared to a prior study in 2016, there are few changes. The  aortic root measures 4.4 cm. The ascending aorta measures up to 4  cm at the most distal visualized point.     ASSESSMENT & PLAN:   1. Dyspnea  -this remains quite severe NYHA III. CPX testing shows multifactorial limitation. He has had f/u RL heart cath which showed no recurrent critical CAD and relatively normal pulmonary pressures.  -VQ without ecurrent  PE  - I suspect multifactorial due to diastolic HF, obesity/OHS and deconditioning. Will refer to cardiac rehab and see if he can progress  - Also recommended weight loss  2. Acute on chronic diastolic CHF:  ] - Volume status stable. Will see how he progresses with CR. If no benefit can consider Cardiomems for closer fluid management   3. CAD s/p DES to dLAD and rPDA (02/2014):  -- Repeat cath with no critical  CAD 10/17. Continue ASA/statin.   4. CKD:  - stable per PCP.  5. Anemia of chronic disease: admitted  03/2015 for transfusion of blood and iron. Followed by Dr. Beryle Beams for evaluation since no GI source noted.   6. Hx of DVT/bilateral PE: continue coumadin.  - VQ 5/18. No evidence of chronic PE. Can consider switch to apixaban 2.5 bid for long-term coverage per Amplify-EXT trial  7. Obesity and deconditioning - As above, discussed need for weight loss ([place]) and exercise program   Birdie Fetty,MD 10:08 AM

## 2016-09-08 NOTE — Patient Instructions (Signed)
You have been referred to Cardiac Rehab, they will contact you to schedule appointment.   Follow up in 4 Months.

## 2016-09-13 ENCOUNTER — Inpatient Hospital Stay: Admission: RE | Admit: 2016-09-13 | Payer: Medicare Other | Source: Ambulatory Visit

## 2016-09-14 ENCOUNTER — Ambulatory Visit (INDEPENDENT_AMBULATORY_CARE_PROVIDER_SITE_OTHER): Payer: BLUE CROSS/BLUE SHIELD | Admitting: Neurology

## 2016-09-14 DIAGNOSIS — R200 Anesthesia of skin: Secondary | ICD-10-CM

## 2016-09-14 DIAGNOSIS — G3184 Mild cognitive impairment, so stated: Secondary | ICD-10-CM

## 2016-09-14 DIAGNOSIS — G629 Polyneuropathy, unspecified: Secondary | ICD-10-CM

## 2016-09-14 DIAGNOSIS — F32A Depression, unspecified: Secondary | ICD-10-CM

## 2016-09-14 DIAGNOSIS — F329 Major depressive disorder, single episode, unspecified: Secondary | ICD-10-CM

## 2016-09-14 DIAGNOSIS — R202 Paresthesia of skin: Secondary | ICD-10-CM

## 2016-09-14 NOTE — Procedures (Signed)
[facility]  [address]  [place],  [address] Tel: [phone] Fax:  [phone] Test Date:  09/14/2016  Patient: [name] DOB: May 04, 1940 Physician: Narda Amber, DO  Sex: Male Height: 5\' 10"  Ref Phys: Ellouise Newer, M.D.  ID#: [id number] Temp: 32.8C Technician:    Patient Complaints: This is a 76 year-old gentleman referred for evaluation of feet paresthesias and gait unsteadiness.  NCV & EMG Findings: Extensive electrodiagnostic testing of the right lower extremity and additional studies of the right shows: 1. Bilateral sural and superficial peroneal sensory responses are absent. 2. Bilateral peroneal (EDB) and tibial motor responses are absent. Bilateral peroneal motor responses at the tibialis anterior are within normal limits. 3. Bilateral tibial H reflex studies show prolonged latency. 4. Chronic motor axon loss changes are seen affecting the muscles below the knee with active denervation isolated to the right medial gastrocnemius muscle. Proximal and deep muscles were not tested as the patient is on anticoagulation therapy.  Impression: The electrophysiologic findings are most consistent with a chronic, generalized sensorimotor polyneuropathy, axon loss in type, affecting the lower extremities.  Sparse active denervation is isolated to the right medial gastrocnemius muscle.   ___________________________ Narda Amber, DO    Nerve Conduction Studies Anti Sensory Summary Table   Site NR Peak (ms) Norm Peak (ms) P-T Amp (V) Norm P-T Amp  Left Sup Peroneal Anti Sensory (Ant Lat Mall)  32.8C  12 cm NR  <4.6  >3  Right Sup Peroneal Anti Sensory (Ant Lat Mall)  32.8C  12 cm NR  <4.6  >3  Left Sural Anti Sensory (Lat Mall)  32.8C  Calf NR  <4.6  >3  Right Sural Anti Sensory (Lat Mall)  32.8C  Calf NR  <4.6  >3   Motor Summary Table   Site NR Onset (ms) Norm Onset (ms) O-P Amp (mV) Norm O-P Amp Site1 Site2 Delta-0 (ms) Dist (cm)  Vel (m/s) Norm Vel (m/s)  Left Peroneal Motor (Ext Dig Brev)  32.8C  Ankle NR  <6.0  >2.5 B Fib Ankle  0.0  >40  B Fib NR     Poplt B Fib  0.0  >40  Poplt NR            Right Peroneal Motor (Ext Dig Brev)  32.8C  Ankle NR  <6.0  >2.5 B Fib Ankle  0.0  >40  B Fib NR     Poplt B Fib  0.0  >40  Poplt NR            Left Peroneal TA Motor (Tib Ant)  32.8C  Fib Head    3.4 <4.5 3.3 >3 Poplit Fib Head 1.7 10.0 59 >40  Poplit    5.1  3.3         Right Peroneal TA Motor (Tib Ant)  32.8C  Fib Head    3.6 <4.5 3.7 >3 Poplit Fib Head 1.6 10.0 63 >40  Poplit    5.2  3.6         Left Tibial Motor (Abd Hall Brev)  32.8C  Ankle NR  <6.0  >4 Knee Ankle  0.0  >40  Knee NR            Right Tibial Motor (Abd Hall Brev)  32.8C  Ankle NR  <6.0  >4 Knee Ankle  0.0  >40  Knee NR             H Reflex Studies   NR H-Lat (  ms) Lat Norm (ms) L-R H-Lat (ms)  Left Tibial (Gastroc)  32.8C     37.96 <35 2.04  Right Tibial (Gastroc)  32.8C     40.00 <35 2.04   EMG   Side Muscle Ins Act Fibs Psw Fasc Number Recrt Dur Dur. Amp Amp. Poly Poly. Comment  Right AntTibialis Nml Nml Nml Nml 1- Rapid Some 1+ Some 1+ Nml Nml N/A  Right Gastroc Nml 1+ Nml Nml 2- Rapid Some 1+ Some 1+ Nml Nml N/A  Right RectFemoris Nml Nml Nml Nml Nml Nml Nml Nml Nml Nml Nml Nml N/A  Right BicepsFemS Nml Nml Nml Nml Nml Nml Nml Nml Nml Nml Nml Nml N/A  Left AntTibialis Nml Nml Nml Nml 1- Rapid Some 1+ Some 1+ Nml Nml N/A  Left Gastroc Nml Nml Nml Nml 2- Rapid Some 1+ Some 1+ Nml Nml N/A  Left RectFemoris Nml Nml Nml Nml Nml Nml Nml Nml Nml Nml Nml Nml N/A      Waveforms:

## 2016-09-17 ENCOUNTER — Telehealth: Payer: Self-pay | Admitting: Neurology

## 2016-09-17 NOTE — Telephone Encounter (Signed)
His EMG was on 8/21

## 2016-09-17 NOTE — Telephone Encounter (Signed)
Pls let wife know the EMG did show neuropathy, which can cause balance issues. Would still proceed with MRI lumbar spine. PT can help with balance issues from neuropathy. Thanks

## 2016-09-17 NOTE — Telephone Encounter (Signed)
Called pt.  It sounded like someone answered, and either the call was lost or they hung up.

## 2016-09-17 NOTE — Telephone Encounter (Signed)
Patient's wife called needing to see if his results were available from when he had his test with Dr. Posey Pronto. Please call. Thanks

## 2016-09-20 ENCOUNTER — Other Ambulatory Visit: Payer: Self-pay

## 2016-09-20 ENCOUNTER — Telehealth: Payer: Self-pay

## 2016-09-20 DIAGNOSIS — G629 Polyneuropathy, unspecified: Secondary | ICD-10-CM

## 2016-09-20 NOTE — Telephone Encounter (Signed)
[name] wife returned call.  Relayed message below.  She states that pt will not be happy about physical therapy, but asked that I place the referral.

## 2016-09-22 NOTE — Addendum Note (Signed)
Encounter addended by: Scarlette Calico, RN on: 09/22/2016  2:26 PM<BR>    Actions taken: Order list changed, Diagnosis association updated

## 2016-09-27 ENCOUNTER — Other Ambulatory Visit: Payer: Self-pay | Admitting: Cardiovascular Disease

## 2016-09-29 ENCOUNTER — Ambulatory Visit
Admission: RE | Admit: 2016-09-29 | Discharge: 2016-09-29 | Disposition: A | Discharge status: Home / Self Care | Payer: Medicare Other | Source: Ambulatory Visit | Attending: Neurology | Admitting: Neurology

## 2016-09-29 DIAGNOSIS — G629 Polyneuropathy, unspecified: Secondary | ICD-10-CM

## 2016-10-04 ENCOUNTER — Telehealth: Payer: Self-pay | Admitting: Neurology

## 2016-10-04 NOTE — Telephone Encounter (Signed)
Pt wanted to know if the MRI results were available yet

## 2016-10-05 NOTE — Telephone Encounter (Signed)
Pls let patient/wife know that the MRI of the lower back is similar to scan in January, there is a lot of degenerative arthritis changes causing spinal stenosis and pressing on the nerves in the back. Is he already doing the PT? Does he want to see a Neurosurgeon for the back? Thanks

## 2016-10-05 NOTE — Telephone Encounter (Signed)
Returned call.  Sounded like someone answered but then call was dropped or they hung up.

## 2016-10-06 ENCOUNTER — Other Ambulatory Visit: Payer: Self-pay

## 2016-10-06 DIAGNOSIS — M48061 Spinal stenosis, lumbar region without neurogenic claudication: Secondary | ICD-10-CM

## 2016-10-06 NOTE — Telephone Encounter (Signed)
Spoke with pt's wife who states that pt is having a hard time getting around.  Relayed message below.  Wife says that pt will not be happy about the referrals, but did ask that I put both in.

## 2016-10-07 ENCOUNTER — Encounter (HOSPITAL_COMMUNITY): Payer: BLUE CROSS/BLUE SHIELD | Admitting: Internal Medicine

## 2016-10-13 ENCOUNTER — Telehealth: Payer: Self-pay | Admitting: Neurology

## 2016-10-13 NOTE — Telephone Encounter (Signed)
LMOVM for Darlene letting her know that I have re-sent the referrals.  Suggested pt tried alternating Tylenol and Ibuprofen in conjunction with his gabapentin and ultram to see if that gives him any relief.

## 2016-10-13 NOTE — Telephone Encounter (Signed)
Patient's wife called regarding his Neuropathy and the pain he is having. She said there were Referrals being sent out for him. She has not heard from anyone. Please call cell phone. [employer] not working. She also would like to know if there is anything for pain that he can take? He is bed bound most of the time. Thanks

## 2016-10-22 ENCOUNTER — Telehealth: Payer: Self-pay

## 2016-10-22 DIAGNOSIS — E039 Hypothyroidism, unspecified: Secondary | ICD-10-CM | POA: Diagnosis not present

## 2016-10-22 NOTE — Telephone Encounter (Signed)
Received call from [facility] who say that they do not believe pt is a good canidate for Sx based on the different medications he is currently taking.  They did say that he would benefit from pain management.  Will fax referral

## 2016-10-25 DIAGNOSIS — E1165 Type 2 diabetes mellitus with hyperglycemia: Secondary | ICD-10-CM | POA: Diagnosis not present

## 2016-10-25 DIAGNOSIS — E273 Drug-induced adrenocortical insufficiency: Secondary | ICD-10-CM | POA: Diagnosis not present

## 2016-10-25 DIAGNOSIS — I10 Essential (primary) hypertension: Secondary | ICD-10-CM | POA: Diagnosis not present

## 2016-10-25 DIAGNOSIS — E291 Testicular hypofunction: Secondary | ICD-10-CM | POA: Diagnosis not present

## 2016-10-25 DIAGNOSIS — R7309 Other abnormal glucose: Secondary | ICD-10-CM | POA: Diagnosis not present

## 2016-10-25 DIAGNOSIS — E039 Hypothyroidism, unspecified: Secondary | ICD-10-CM | POA: Diagnosis not present

## 2016-10-27 ENCOUNTER — Ambulatory Visit (INDEPENDENT_AMBULATORY_CARE_PROVIDER_SITE_OTHER): Payer: Medicare Other | Admitting: *Deleted

## 2016-10-27 ENCOUNTER — Ambulatory Visit: Payer: Medicare Other | Admitting: Physical Therapy

## 2016-10-27 ENCOUNTER — Other Ambulatory Visit: Payer: Self-pay | Admitting: Cardiovascular Disease

## 2016-10-27 DIAGNOSIS — I824Z9 Acute embolism and thrombosis of unspecified deep veins of unspecified distal lower extremity: Secondary | ICD-10-CM

## 2016-10-27 DIAGNOSIS — Z5181 Encounter for therapeutic drug level monitoring: Secondary | ICD-10-CM | POA: Diagnosis not present

## 2016-10-27 LAB — POCT INR: INR: 4.4

## 2016-11-03 ENCOUNTER — Ambulatory Visit: Payer: Medicare Other | Admitting: Physical Therapy

## 2016-11-08 ENCOUNTER — Other Ambulatory Visit: Payer: Self-pay | Admitting: Cardiovascular Disease

## 2016-11-08 NOTE — Telephone Encounter (Signed)
This is a CHF pt 

## 2016-11-15 ENCOUNTER — Ambulatory Visit (INDEPENDENT_AMBULATORY_CARE_PROVIDER_SITE_OTHER): Payer: Medicare Other

## 2016-11-15 DIAGNOSIS — I2699 Other pulmonary embolism without acute cor pulmonale: Secondary | ICD-10-CM

## 2016-11-15 DIAGNOSIS — Z5181 Encounter for therapeutic drug level monitoring: Secondary | ICD-10-CM | POA: Diagnosis not present

## 2016-11-15 LAB — PROTIME-INR
INR: 6.4 — AB (ref 0.8–1.2)
PROTHROMBIN TIME: 67.8 s — AB (ref 9.1–12.0)

## 2016-11-15 LAB — POCT INR: INR: 7.8

## 2016-11-16 ENCOUNTER — Telehealth (HOSPITAL_COMMUNITY): Payer: Self-pay

## 2016-11-16 NOTE — Telephone Encounter (Signed)
Called and spoke with wife of patient - She stated he is still currently ill. Dr.Aquino referred him to a different rehab - Wife will f/u with [place] and will call back.

## 2016-11-22 ENCOUNTER — Ambulatory Visit: Payer: Medicare Other | Admitting: Dietician

## 2016-11-24 NOTE — Telephone Encounter (Signed)
Attempted to call patient to follow up on behalf of pulmonary Rehab - LMTCB

## 2016-11-26 ENCOUNTER — Ambulatory Visit: Payer: Medicare Other | Attending: Neurology | Admitting: Physical Therapy

## 2016-12-01 NOTE — Telephone Encounter (Signed)
Called and spoke with wife to follow up in regards to Rehab - She stated that she has not contacted Dr.Aquino. She will contact now and give [place] a call back.

## 2016-12-02 DIAGNOSIS — S61350A Open bite of right index finger with damage to nail, initial encounter: Secondary | ICD-10-CM | POA: Diagnosis not present

## 2016-12-02 DIAGNOSIS — W5501XA Bitten by cat, initial encounter: Secondary | ICD-10-CM | POA: Diagnosis not present

## 2016-12-03 ENCOUNTER — Ambulatory Visit (INDEPENDENT_AMBULATORY_CARE_PROVIDER_SITE_OTHER): Payer: Medicare Other | Admitting: *Deleted

## 2016-12-03 DIAGNOSIS — Z86711 Personal history of pulmonary embolism: Secondary | ICD-10-CM | POA: Diagnosis not present

## 2016-12-03 DIAGNOSIS — Z5181 Encounter for therapeutic drug level monitoring: Secondary | ICD-10-CM | POA: Diagnosis not present

## 2016-12-03 LAB — POCT INR: INR: 5.4

## 2016-12-03 MED ORDER — WARFARIN SODIUM 3 MG PO TABS: ORAL_TABLET | ORAL | 0 refills | Status: DC

## 2016-12-03 NOTE — Progress Notes (Signed)
Do not take any Coumadin today Nov 9th no coumadin on Nov 10th and no coumadin on Nov 11th then change coumadin dose to  1 tablet daily except 1/2 tablet on Sundays Tuesdays and Thursdays.  Recheck in 1 week.

## 2016-12-07 NOTE — Telephone Encounter (Signed)
Wife called and stated that patient was bit by a cat and is now sick..She is also stated that patient was referred to Neuro Rehab. Will place this order on medical hold until we receive clearance from the Neuro surgeon stating patient is ready for Pulmonary Rehab.

## 2016-12-10 DIAGNOSIS — G4700 Insomnia, unspecified: Secondary | ICD-10-CM | POA: Diagnosis not present

## 2016-12-10 DIAGNOSIS — G629 Polyneuropathy, unspecified: Secondary | ICD-10-CM | POA: Diagnosis not present

## 2016-12-10 DIAGNOSIS — Z23 Encounter for immunization: Secondary | ICD-10-CM | POA: Diagnosis not present

## 2016-12-10 DIAGNOSIS — I10 Essential (primary) hypertension: Secondary | ICD-10-CM | POA: Diagnosis not present

## 2016-12-10 DIAGNOSIS — K219 Gastro-esophageal reflux disease without esophagitis: Secondary | ICD-10-CM | POA: Diagnosis not present

## 2016-12-10 DIAGNOSIS — R110 Nausea: Secondary | ICD-10-CM | POA: Diagnosis not present

## 2016-12-14 ENCOUNTER — Telehealth (HOSPITAL_COMMUNITY): Payer: Self-pay

## 2016-12-14 NOTE — Telephone Encounter (Signed)
No response from patient in regards to Pulmonary Rehab - Closing referral.

## 2017-01-05 DIAGNOSIS — R05 Cough: Secondary | ICD-10-CM | POA: Diagnosis not present

## 2017-01-05 DIAGNOSIS — J180 Bronchopneumonia, unspecified organism: Secondary | ICD-10-CM | POA: Diagnosis not present

## 2017-01-09 ENCOUNTER — Other Ambulatory Visit: Payer: Self-pay | Admitting: Cardiovascular Disease

## 2017-01-10 ENCOUNTER — Encounter (HOSPITAL_COMMUNITY): Payer: Medicare Other | Admitting: Internal Medicine

## 2017-01-10 NOTE — Telephone Encounter (Signed)
° ° °*  STAT* If patient is at the pharmacy, call can be transferred to refill team.   1. Which medications need to be refilled? (please list name of each medication and dose if known) warfarin (COUMADIN) 3 MG tablet  2. Which pharmacy/location (including street and city if local pharmacy) is medication to be sent to? [facility]  3. Do they need a 30 day or 90 day supply? 30  

## 2017-01-19 ENCOUNTER — Ambulatory Visit (INDEPENDENT_AMBULATORY_CARE_PROVIDER_SITE_OTHER): Payer: Medicare Other

## 2017-01-19 DIAGNOSIS — Z5181 Encounter for therapeutic drug level monitoring: Secondary | ICD-10-CM | POA: Diagnosis not present

## 2017-01-19 DIAGNOSIS — I2699 Other pulmonary embolism without acute cor pulmonale: Secondary | ICD-10-CM | POA: Diagnosis not present

## 2017-01-19 LAB — POCT INR: INR: 1.7

## 2017-01-19 NOTE — Patient Instructions (Signed)
Take 1.5 tablets today, then start taking 1 tablet daily except 1/2 tablet on Sundays and Thursdays.  Recheck in 2 weeks.

## 2017-01-20 ENCOUNTER — Other Ambulatory Visit: Payer: Self-pay | Admitting: Cardiovascular Disease

## 2017-01-21 ENCOUNTER — Emergency Department (HOSPITAL_COMMUNITY): Payer: Medicare Other

## 2017-01-21 ENCOUNTER — Inpatient Hospital Stay (HOSPITAL_COMMUNITY)
Admission: EM | Admit: 2017-01-21 | Discharge: 2017-01-25 | DRG: 872 | Disposition: A | Discharge status: Home Health | Payer: Medicare Other | Attending: Internal Medicine | Admitting: Internal Medicine

## 2017-01-21 ENCOUNTER — Other Ambulatory Visit: Payer: Self-pay

## 2017-01-21 ENCOUNTER — Encounter (HOSPITAL_COMMUNITY): Payer: Self-pay

## 2017-01-21 DIAGNOSIS — F329 Major depressive disorder, single episode, unspecified: Secondary | ICD-10-CM | POA: Diagnosis present

## 2017-01-21 DIAGNOSIS — L03116 Cellulitis of left lower limb: Secondary | ICD-10-CM

## 2017-01-21 DIAGNOSIS — E876 Hypokalemia: Secondary | ICD-10-CM | POA: Diagnosis present

## 2017-01-21 DIAGNOSIS — D631 Anemia in chronic kidney disease: Secondary | ICD-10-CM | POA: Diagnosis present

## 2017-01-21 DIAGNOSIS — Z86718 Personal history of other venous thrombosis and embolism: Secondary | ICD-10-CM | POA: Diagnosis not present

## 2017-01-21 DIAGNOSIS — Z79890 Hormone replacement therapy (postmenopausal): Secondary | ICD-10-CM | POA: Diagnosis not present

## 2017-01-21 DIAGNOSIS — Z955 Presence of coronary angioplasty implant and graft: Secondary | ICD-10-CM | POA: Diagnosis not present

## 2017-01-21 DIAGNOSIS — J9611 Chronic respiratory failure with hypoxia: Secondary | ICD-10-CM | POA: Diagnosis present

## 2017-01-21 DIAGNOSIS — I130 Hypertensive heart and chronic kidney disease with heart failure and stage 1 through stage 4 chronic kidney disease, or unspecified chronic kidney disease: Secondary | ICD-10-CM | POA: Diagnosis not present

## 2017-01-21 DIAGNOSIS — I5042 Chronic combined systolic (congestive) and diastolic (congestive) heart failure: Secondary | ICD-10-CM | POA: Diagnosis present

## 2017-01-21 DIAGNOSIS — Z9981 Dependence on supplemental oxygen: Secondary | ICD-10-CM

## 2017-01-21 DIAGNOSIS — N179 Acute kidney failure, unspecified: Secondary | ICD-10-CM | POA: Diagnosis present

## 2017-01-21 DIAGNOSIS — L03119 Cellulitis of unspecified part of limb: Secondary | ICD-10-CM | POA: Diagnosis present

## 2017-01-21 DIAGNOSIS — E860 Dehydration: Secondary | ICD-10-CM | POA: Diagnosis present

## 2017-01-21 DIAGNOSIS — E872 Acidosis: Secondary | ICD-10-CM

## 2017-01-21 DIAGNOSIS — L02619 Cutaneous abscess of unspecified foot: Secondary | ICD-10-CM

## 2017-01-21 DIAGNOSIS — R000 Tachycardia, unspecified: Secondary | ICD-10-CM | POA: Diagnosis not present

## 2017-01-21 DIAGNOSIS — Z7901 Long term (current) use of anticoagulants: Secondary | ICD-10-CM | POA: Diagnosis not present

## 2017-01-21 DIAGNOSIS — M7989 Other specified soft tissue disorders: Secondary | ICD-10-CM | POA: Diagnosis not present

## 2017-01-21 DIAGNOSIS — I9589 Other hypotension: Secondary | ICD-10-CM

## 2017-01-21 DIAGNOSIS — I2510 Atherosclerotic heart disease of native coronary artery without angina pectoris: Secondary | ICD-10-CM | POA: Diagnosis not present

## 2017-01-21 DIAGNOSIS — J9610 Chronic respiratory failure, unspecified whether with hypoxia or hypercapnia: Secondary | ICD-10-CM | POA: Diagnosis present

## 2017-01-21 DIAGNOSIS — M6281 Muscle weakness (generalized): Secondary | ICD-10-CM | POA: Diagnosis not present

## 2017-01-21 DIAGNOSIS — Z86711 Personal history of pulmonary embolism: Secondary | ICD-10-CM

## 2017-01-21 DIAGNOSIS — R296 Repeated falls: Secondary | ICD-10-CM | POA: Diagnosis not present

## 2017-01-21 DIAGNOSIS — D72829 Elevated white blood cell count, unspecified: Secondary | ICD-10-CM

## 2017-01-21 DIAGNOSIS — E271 Primary adrenocortical insufficiency: Secondary | ICD-10-CM | POA: Diagnosis not present

## 2017-01-21 DIAGNOSIS — E871 Hypo-osmolality and hyponatremia: Secondary | ICD-10-CM | POA: Diagnosis present

## 2017-01-21 DIAGNOSIS — N183 Chronic kidney disease, stage 3 unspecified: Secondary | ICD-10-CM | POA: Diagnosis present

## 2017-01-21 DIAGNOSIS — E861 Hypovolemia: Secondary | ICD-10-CM | POA: Diagnosis not present

## 2017-01-21 DIAGNOSIS — R531 Weakness: Secondary | ICD-10-CM | POA: Diagnosis not present

## 2017-01-21 DIAGNOSIS — E039 Hypothyroidism, unspecified: Secondary | ICD-10-CM | POA: Diagnosis present

## 2017-01-21 DIAGNOSIS — K219 Gastro-esophageal reflux disease without esophagitis: Secondary | ICD-10-CM | POA: Diagnosis present

## 2017-01-21 DIAGNOSIS — A419 Sepsis, unspecified organism: Secondary | ICD-10-CM | POA: Diagnosis not present

## 2017-01-21 DIAGNOSIS — E038 Other specified hypothyroidism: Secondary | ICD-10-CM | POA: Diagnosis not present

## 2017-01-21 DIAGNOSIS — R2242 Localized swelling, mass and lump, left lower limb: Secondary | ICD-10-CM | POA: Diagnosis not present

## 2017-01-21 DIAGNOSIS — R030 Elevated blood-pressure reading, without diagnosis of hypertension: Secondary | ICD-10-CM

## 2017-01-21 DIAGNOSIS — I959 Hypotension, unspecified: Secondary | ICD-10-CM

## 2017-01-21 LAB — I-STAT CG4 LACTIC ACID, ED
Lactic Acid, Venous: 2.05 mmol/L (ref 0.5–1.9)
Lactic Acid, Venous: 2.09 mmol/L (ref 0.5–1.9)

## 2017-01-21 LAB — COMPREHENSIVE METABOLIC PANEL
ALK PHOS: 81 U/L (ref 38–126)
ALT: 36 U/L (ref 17–63)
ANION GAP: 8 (ref 5–15)
AST: 31 U/L (ref 15–41)
Albumin: 2.8 g/dL — ABNORMAL LOW (ref 3.5–5.0)
BUN: 21 mg/dL — ABNORMAL HIGH (ref 6–20)
CALCIUM: 7.9 mg/dL — AB (ref 8.9–10.3)
CHLORIDE: 104 mmol/L (ref 101–111)
CO2: 22 mmol/L (ref 22–32)
CREATININE: 1.73 mg/dL — AB (ref 0.61–1.24)
GFR, EST AFRICAN AMERICAN: 42 mL/min — AB (ref >60)
GFR, EST NON AFRICAN AMERICAN: 37 mL/min — AB (ref >60)
Glucose, Bld: 212 mg/dL — ABNORMAL HIGH (ref 65–99)
Potassium: 3.4 mmol/L — ABNORMAL LOW (ref 3.5–5.1)
Sodium: 134 mmol/L — ABNORMAL LOW (ref 135–145)
Total Bilirubin: 1.8 mg/dL — ABNORMAL HIGH (ref 0.3–1.2)
Total Protein: 6.4 g/dL — ABNORMAL LOW (ref 6.5–8.1)

## 2017-01-21 LAB — URINALYSIS, ROUTINE W REFLEX MICROSCOPIC
BILIRUBIN URINE: NEGATIVE
GLUCOSE, UA: NEGATIVE mg/dL
Hgb urine dipstick: NEGATIVE
Ketones, ur: NEGATIVE mg/dL
LEUKOCYTES UA: NEGATIVE
NITRITE: NEGATIVE
PH: 5 (ref 5.0–8.0)
Protein, ur: 30 mg/dL — AB
Specific Gravity, Urine: 1.015 (ref 1.005–1.030)
Squamous Epithelial / LPF: NONE SEEN

## 2017-01-21 LAB — CBC WITH DIFFERENTIAL/PLATELET
BASOS PCT: 0 %
Basophils Absolute: 0 10*3/uL (ref 0.0–0.1)
Eosinophils Absolute: 0 10*3/uL (ref 0.0–0.7)
Eosinophils Relative: 0 %
HCT: 34.4 % — ABNORMAL LOW (ref 39.0–52.0)
HEMOGLOBIN: 11.2 g/dL — AB (ref 13.0–17.0)
Lymphocytes Relative: 8 %
Lymphs Abs: 1.8 10*3/uL (ref 0.7–4.0)
MCH: 26.6 pg (ref 26.0–34.0)
MCHC: 32.6 g/dL (ref 30.0–36.0)
MCV: 81.7 fL (ref 78.0–100.0)
Monocytes Absolute: 2.7 10*3/uL — ABNORMAL HIGH (ref 0.1–1.0)
Monocytes Relative: 13 %
NEUTROS PCT: 79 %
Neutro Abs: 16.8 10*3/uL — ABNORMAL HIGH (ref 1.7–7.7)
Platelets: 370 10*3/uL (ref 150–400)
RBC: 4.21 MIL/uL — AB (ref 4.22–5.81)
RDW: 16.6 % — ABNORMAL HIGH (ref 11.5–15.5)
WBC: 21.3 10*3/uL — AB (ref 4.0–10.5)

## 2017-01-21 LAB — PROTIME-INR
INR: 1.88
Prothrombin Time: 21.5 seconds — ABNORMAL HIGH (ref 11.4–15.2)

## 2017-01-21 MED ORDER — FERROUS SULFATE 325 (65 FE) MG PO TABS
325.0000 mg | ORAL_TABLET | Freq: Every day | ORAL | Status: DC
  Administered 2017-01-22 – 2017-01-25 (×4): 325 mg via ORAL
  Filled 2017-01-21 (×4): qty 1

## 2017-01-21 MED ORDER — ONDANSETRON HCL 4 MG PO TABS: 4.0000 mg | ORAL_TABLET | Freq: Four times a day (QID) | ORAL | Status: DC | PRN

## 2017-01-21 MED ORDER — SODIUM CHLORIDE 0.9% FLUSH: 3.0000 mL | INTRAVENOUS | Status: DC | PRN

## 2017-01-21 MED ORDER — ISOSORBIDE MONONITRATE 15 MG HALF TABLET: 15.0000 mg | ORAL_TABLET | Freq: Every day | ORAL | Status: DC

## 2017-01-21 MED ORDER — SODIUM CHLORIDE 0.9% FLUSH
3.0000 mL | Freq: Two times a day (BID) | INTRAVENOUS | Status: DC
  Administered 2017-01-21 – 2017-01-24 (×6): 3 mL via INTRAVENOUS

## 2017-01-21 MED ORDER — WARFARIN - PHARMACIST DOSING INPATIENT: Freq: Every day | Status: DC

## 2017-01-21 MED ORDER — VANCOMYCIN HCL IN DEXTROSE 1-5 GM/200ML-% IV SOLN
1000.0000 mg | Freq: Once | INTRAVENOUS | Status: DC
  Administered 2017-01-21: 1000 mg via INTRAVENOUS
  Filled 2017-01-21: qty 200

## 2017-01-21 MED ORDER — ONDANSETRON HCL 4 MG/2ML IJ SOLN: 4.0000 mg | Freq: Four times a day (QID) | INTRAMUSCULAR | Status: DC | PRN

## 2017-01-21 MED ORDER — HYDROCORTISONE 20 MG PO TABS: 20.0000 mg | ORAL_TABLET | Freq: Three times a day (TID) | ORAL | Status: DC

## 2017-01-21 MED ORDER — PIPERACILLIN-TAZOBACTAM 3.375 G IVPB 30 MIN
3.3750 g | Freq: Once | INTRAVENOUS | Status: AC
  Administered 2017-01-21: 3.375 g via INTRAVENOUS
  Filled 2017-01-21: qty 50

## 2017-01-21 MED ORDER — CARVEDILOL 6.25 MG PO TABS
6.2500 mg | ORAL_TABLET | Freq: Two times a day (BID) | ORAL | Status: DC
Start: 2017-01-21 — End: 2017-01-25
  Administered 2017-01-22 – 2017-01-24 (×6): 6.25 mg via ORAL
  Filled 2017-01-21 (×7): qty 1

## 2017-01-21 MED ORDER — HYDROCORTISONE NA SUCCINATE PF 100 MG IJ SOLR
50.0000 mg | Freq: Three times a day (TID) | INTRAMUSCULAR | Status: DC
  Administered 2017-01-22 (×2): 50 mg via INTRAVENOUS
  Filled 2017-01-21 (×3): qty 1

## 2017-01-21 MED ORDER — ACETAMINOPHEN 325 MG PO TABS
650.0000 mg | ORAL_TABLET | Freq: Four times a day (QID) | ORAL | Status: DC | PRN
  Administered 2017-01-22 – 2017-01-25 (×4): 650 mg via ORAL
  Filled 2017-01-21 (×4): qty 2

## 2017-01-21 MED ORDER — THYROID 60 MG PO TABS
60.0000 mg | ORAL_TABLET | Freq: Every day | ORAL | Status: DC
  Administered 2017-01-22 – 2017-01-25 (×4): 60 mg via ORAL
  Filled 2017-01-21 (×4): qty 1

## 2017-01-21 MED ORDER — GABAPENTIN 300 MG PO CAPS
600.0000 mg | ORAL_CAPSULE | Freq: Every day | ORAL | Status: DC
  Administered 2017-01-21 – 2017-01-25 (×5): 600 mg via ORAL
  Filled 2017-01-21 (×5): qty 2

## 2017-01-21 MED ORDER — SODIUM CHLORIDE 0.9 % IV SOLN: 250.0000 mL | INTRAVENOUS | Status: DC | PRN

## 2017-01-21 MED ORDER — SODIUM CHLORIDE 0.9 % IV BOLUS (SEPSIS)
500.0000 mL | Freq: Once | INTRAVENOUS | Status: AC
  Administered 2017-01-21: 500 mL via INTRAVENOUS

## 2017-01-21 MED ORDER — WARFARIN SODIUM 4 MG PO TABS
4.0000 mg | ORAL_TABLET | Freq: Once | ORAL | Status: AC
  Administered 2017-01-21: 4 mg via ORAL
  Filled 2017-01-21: qty 1

## 2017-01-21 MED ORDER — ACETAMINOPHEN 650 MG RE SUPP: 650.0000 mg | Freq: Four times a day (QID) | RECTAL | Status: DC | PRN

## 2017-01-21 MED ORDER — CEFAZOLIN SODIUM-DEXTROSE 2-4 GM/100ML-% IV SOLN
2.0000 g | Freq: Three times a day (TID) | INTRAVENOUS | Status: DC
  Administered 2017-01-21 – 2017-01-25 (×11): 2 g via INTRAVENOUS
  Filled 2017-01-21 (×10): qty 100

## 2017-01-21 NOTE — Progress Notes (Signed)
A consult was received from an ED physician for vanc/Zosyn per pharmacy dosing.  The patient's profile has been reviewed for ht/wt/allergies/indication/available labs.   A one time order has been placed for Zosyn 3.375g and Vanc 1g.  Further antibiotics/pharmacy consults should be ordered by admitting physician if indicated.                       Thank you, Kara Mead 01/21/2017  2:36 PM

## 2017-01-21 NOTE — H&P (Addendum)
History and Physical    [name] HCW:[id number] DOB: [birth year] DOA: 01/21/2017    PCP: Lawerance Cruel, MD  Patient coming from: home  Chief Complaint: swelling left foot  HPI: [name] is a 76 y.o. male with medical history of d CHF, CKD3, DVT/ PE. HTN who presents for a generalized weakness, per syncope/ feeling light headed when he stands up. He also has redness and swelling of the left foot and lower leg.  BP was noted to be in the 80s when checked by EMS and he was given a fluid bolus. The patient and wife initially suspected he may have swelling due to his h/o blood clots but last night he became worried that the foot was red and hot and thought about coming in to the ER.   ED Course: WBC 21.3, SBP in lows 100s, HR in low 100s as well mild lactic acidosis, hyponatremia and hypokalemia (dehydrated)   Review of Systems:  All other systems reviewed and apart from HPI, are negative.  Past Medical History:  Diagnosis Date  . Acute lower GI bleeding    a. admitted to [facility] 04/11/2013;  b. 04/2014 EGD: 1.  gastric erosions, mild prox gastritis and bulbar duodenitis->Protonix.  . Addison's disease ([place])   . Anemia   . Anemia in CKD (chronic kidney disease) 06/25/2015  . Chronic back pain   . Chronic diastolic CHF (congestive heart failure) ([place])    a. His initial ejection fraction was between 30 and 35%;  b. 04/2013 Echo: EF 50-55% Gr1 DD, mild-mod MR;  c. 04/2014 Echo: EF 60-65%, Gr 1 DD, triv TR.  . CKD (chronic kidney disease), stage III ([place])   . Coronary artery disease    a. 07/2014 - s/p difficult PCI - had pressure wire analysis of LAD s/p DES in mid segment c/b wire-induced dissection in the distal segment of the LAD which was treated with repeated balloon inflations. Also had DES to rPDA - again had either spasm distal to the stent placement or an edge dissection but the vessel was small in that area; procedure aborted.  . Daily headache   . DVT (deep venous  thrombosis) ([place])    a. 02/2013 superficial thrombophlebitis --> Xarelto later d/c'd 2/2 GIB;  b. 04/2014 Acute Right PT, Peroneal, Popliteal, Femoral, and common femoral DVT-->coumadin  . GERD (gastroesophageal reflux disease)   . H/O hiatal hernia   . Hepatitis    a. 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  . History of aortic insufficiency   . Hx of cardiovascular stress test    a. ETT-Myoview 7/14:  Low risk, inferior defect consistent with thinning, no ischemia, normal wall motion, EF 54%  . Hypertension   . Hypogonadism male   . Obesity   . On home oxygen therapy    a. added 04/2014 in setting of PE.  Marland Kitchen Peptic ulcer disease   . Pulmonary embolism ([place])    a. 04/2014 CTA: Bilat submassive PE ->coumadin.  . Pulmonary HTN ([place])   . Thyroid disease     Past Surgical History:  Procedure Laterality Date  . APPENDECTOMY  1950's  . CARDIAC CATHETERIZATION  06/26/2008   EF 30-35%  . CARDIAC CATHETERIZATION N/A 08/20/2014   Procedure: Right/Left Heart Cath and Coronary Angiography;  Surgeon: Jolaine Artist, MD;  Location: [place] CV LAB;  Service: Cardiovascular;  Laterality: N/A;  . CARDIAC CATHETERIZATION N/A 08/21/2014   Procedure: Coronary Stent Intervention;  Surgeon: Wellington Hampshire, MD;  Location:  [place] INVASIVE CV LAB;  Service: Cardiovascular;  Laterality: N/A;  . CARDIAC CATHETERIZATION N/A 11/24/2015   Procedure: Right/Left Heart Cath and Coronary Angiography;  Surgeon: Jolaine Artist, MD;  Location: [place] CV LAB;  Service: Cardiovascular;  Laterality: N/A;  . CHOLECYSTECTOMY  ~ 1965  . ESOPHAGOGASTRODUODENOSCOPY N/A 04/11/2013   Procedure: ESOPHAGOGASTRODUODENOSCOPY (EGD);  Surgeon: Missy Sabins, MD;  Location: [facility] ENDOSCOPY;  Service: Endoscopy;  Laterality: N/A;  . ESOPHAGOGASTRODUODENOSCOPY N/A 05/13/2014   Procedure: ESOPHAGOGASTRODUODENOSCOPY (EGD);  Surgeon: Arta Silence, MD;  Location: [facility] ENDOSCOPY;  Service: Endoscopy;  Laterality: N/A;  . TONSILLECTOMY  1940's  . US  ECHOCARDIOGRAPHY  04/09/2010   EF 60-65%    Social History:   reports that  has never smoked. he has never used smokeless tobacco. He reports that he does not drink alcohol or use drugs.  No Known Allergies  Family History  Problem Relation Age of Onset  . Clotting disorder Mother   . Clotting disorder Brother   . Clotting disorder Brother   . Clotting disorder Brother   . Clotting disorder Other        Niece.  Marland Kitchen Heart attack Neg Hx      Prior to Admission medications   Medication Sig Start Date End Date Taking? Authorizing Provider  CALCIUM PO Take by mouth.   Yes [provider]  carvedilol (COREG) 6.25 MG tablet TAKE 1 TABLET TWICE A DAY WITH MEALS 12/15/16  Yes Bensimhon, Shaune Pascal, MD  Cholecalciferol (VITAMIN D3 PO) Take 1,000 capsules by mouth daily.    Yes [provider]  ferrous sulfate 325 (65 FE) MG tablet Take 1 tablet (325 mg total) by mouth daily with breakfast. 04/17/15  Yes Florencia Reasons, MD  furosemide (LASIX) 40 MG tablet Take 1 tablet (40 mg total) by mouth 2 (two) times daily. 05/25/16  Yes Bhagat, Bhavinkumar, PA  gabapentin (NEURONTIN) 600 MG tablet Take 600 mg by mouth daily.   Yes [provider]  hydrocortisone (CORTEF) 20 MG tablet Take 20 mg by mouth. Taking three times daily 20mg /10mg /20mg    Yes [provider]  ibuprofen (ADVIL,MOTRIN) 200 MG tablet Take 400 mg by mouth at bedtime as needed (PAIN).   Yes [provider]  isosorbide mononitrate (IMDUR) 30 MG 24 hr tablet Take 0.5 tablets (15 mg total) by mouth daily. 09/05/14  Yes Weaver, Scott T, PA-C  lisinopril (PRINIVIL,ZESTRIL) 10 MG tablet Take 0.5 tablets (5 mg total) by mouth daily. 05/10/16  Yes [name], Wonda Cheng, MD  losartan (COZAAR) 25 MG tablet Take 25 mg by mouth daily. 01/10/17  Yes [provider]  MAGNESIUM PO Take by mouth.   Yes [provider]  metoCLOPramide (REGLAN) 5 MG tablet Take 5 mg by mouth daily.    Yes [provider]  Multiple Vitamins-Minerals (ZINC PO) Take by mouth.   Yes [provider]  nitroGLYCERIN (NITROSTAT) 0.4 MG SL tablet PLACE 1 TABLET UNDER TONGUE EVERY 5 MINUTES AS NEEDED FOR CHEST PAIN (UP TO 3 DOSES) 03/22/16  Yes [name], Wonda Cheng, MD  pantoprazole (PROTONIX) 40 MG tablet Take 40 mg by mouth daily. 12/10/16  Yes [provider]  potassium chloride SA (K-DUR,KLOR-CON) 20 MEQ tablet Take 20 mEq by mouth daily.   Yes [provider]  PROAIR HFA 108 (90 Base) MCG/ACT inhaler INHALE 2 PUFFS INTO THE LUNGS EVERY 6 (SIX) HOURS AS NEEDED FOR WHEEZING OR SHORTNESS OF BREATH. 07/08/15  Yes Byrum, Rose Fillers, MD  promethazine [facility])  25 MG tablet Take 25 mg by mouth every 6 (six) hours as needed for nausea.  05/17/14  Yes [provider]  thyroid (ARMOUR) 60 MG tablet Take 60 mg by mouth daily.    Yes [provider]  traMADol (ULTRAM) 50 MG tablet Take 100 mg by mouth 3 (three) times daily.    Yes [provider]  traZODone (DESYREL) 100 MG tablet Take 100 mg by mouth every evening.  04/16/14  Yes [provider]  VITAMIN E PO Take by mouth.   Yes [provider]  warfarin (COUMADIN) 3 MG tablet TAKE AS DIRECTED BY [place] Patient taking differently: TAKE 1.5 MGS ON THURSDAYS AND SUNDAYS AS DIRECTED BY COOUMADIN CLINIC 01/10/17  Yes [name], Wonda Cheng, MD  warfarin (COUMADIN) 3 MG tablet TAKE AS DIRECTED BY [place] Patient taking differently: TAKE 3 MGS ON MONDAYS, TUESDAYS, WEDNESDAYS, FRIDAYS AND SATURDAYS AS DIRECTED BY [place] 01/20/17  Yes [name], Wonda Cheng, MD  buPROPion (WELLBUTRIN XL) 150 MG 24 hr tablet Take 1 tablet (150 mg total) by mouth daily. Patient not taking: Reported on 01/21/2017 07/30/16   Cameron Sprang, MD  escitalopram (LEXAPRO) 10 MG tablet Take 1 tablet (10 mg total) by mouth daily. Patient not taking: Reported on 01/21/2017 07/26/16   Cameron Sprang, MD  folic acid (FOLVITE) 1 MG  tablet Take 1 tablet (1 mg total) by mouth daily. Patient not taking: Reported on 01/21/2017 04/17/15   Florencia Reasons, MD    Physical Exam: Wt Readings from Last 3 Encounters:  01/21/17 108 kg (238 lb)  09/08/16 108.2 kg (238 lb 8 oz)  07/26/16 108 kg (238 lb)   Vitals:   01/21/17 1330 01/21/17 1400 01/21/17 1430 01/21/17 1500  BP: 115/73 115/77 113/81 122/79  Pulse: 99 (!) 104 (!) 102 92  Resp: (!) 21 20 (!) 23 18  Temp:      TempSrc:      SpO2: 97% 98% 99% 97%  Weight:      Height:          Constitutional: NAD, calm, comfortable Eyes: PERTLA, lids and conjunctivae normal ENMT: Mucous membranes are moist. Posterior pharynx clear of any exudate or lesions. Normal dentition.  Neck: normal, supple, no masses, no thyromegaly Respiratory: clear to auscultation bilaterally, no wheezing, no crackles. Normal respiratory effort. No accessory muscle use.  Cardiovascular: S1 & S2 heard, regular rate and rhythm, no murmurs / rubs / gallops. No extremity edema. 2+ pedal pulses. No carotid bruits.  Abdomen: No distension, no tenderness, no masses palpated. No hepatosplenomegaly. Bowel sounds normal.  Musculoskeletal: no clubbing / cyanosis. No joint deformity upper and lower extremities. Good ROM, no contractures. Normal muscle tone.  Skin: left foot and lower leg is red, warm and swollen Neurologic: CN 2-12 grossly intact. Sensation intact, DTR normal. Strength 5/5 in all 4 limbs.  Psychiatric: Normal judgment and insight. Alert and oriented x 3. Normal mood.     Labs on Admission: I have personally reviewed following labs and imaging studies  CBC: Recent Labs  Lab 01/21/17 1325  WBC 21.3*  NEUTROABS 16.8*  HGB 11.2*  HCT 34.4*  MCV 81.7  PLT 284   Basic Metabolic Panel: Recent Labs  Lab 01/21/17 1325  NA 134*  K 3.4*  CL 104  CO2 22  GLUCOSE 212*  BUN 21*  CREATININE 1.73*  CALCIUM 7.9*   GFR: Estimated Creatinine Clearance: 44.7 mL/min (A) (by C-G formula based on SCr  of 1.73  mg/dL (H)). Liver Function Tests: Recent Labs  Lab 01/21/17 1325  AST 31  ALT 36  ALKPHOS 81  BILITOT 1.8*  PROT 6.4*  ALBUMIN 2.8*   No results for input(s): LIPASE, AMYLASE in the last 168 hours. No results for input(s): AMMONIA in the last 168 hours. Coagulation Profile: Recent Labs  Lab 01/19/17 0937 01/21/17 1325  INR 1.7 1.88   Cardiac Enzymes: No results for input(s): CKTOTAL, CKMB, CKMBINDEX, TROPONINI in the last 168 hours. BNP (last 3 results) Recent Labs    05/19/16 1500  PROBNP 40   HbA1C: No results for input(s): HGBA1C in the last 72 hours. CBG: No results for input(s): GLUCAP in the last 168 hours. Lipid Profile: No results for input(s): CHOL, HDL, LDLCALC, TRIG, CHOLHDL, LDLDIRECT in the last 72 hours. Thyroid Function Tests: No results for input(s): TSH, T4TOTAL, FREET4, T3FREE, THYROIDAB in the last 72 hours. Anemia Panel: No results for input(s): VITAMINB12, FOLATE, FERRITIN, TIBC, IRON, RETICCTPCT in the last 72 hours. Urine analysis:    Component Value Date/Time   COLORURINE YELLOW 01/21/2017 1325   APPEARANCEUR CLEAR 01/21/2017 1325   LABSPEC 1.015 01/21/2017 1325   PHURINE 5.0 01/21/2017 1325   GLUCOSEU NEGATIVE 01/21/2017 1325   HGBUR NEGATIVE 01/21/2017 1325   BILIRUBINUR NEGATIVE 01/21/2017 1325   KETONESUR NEGATIVE 01/21/2017 1325   PROTEINUR 30 (A) 01/21/2017 1325   UROBILINOGEN 0.2 03/21/2007 1015   NITRITE NEGATIVE 01/21/2017 1325   LEUKOCYTESUR NEGATIVE 01/21/2017 1325   Sepsis Labs: @LABRCNTIP (procalcitonin:4,lacticidven:4) )No results found for this or any previous visit (from the past 240 hour(s)).   Radiological Exams on Admission: Dg Chest Port 1 View  Result Date: 01/21/2017 CLINICAL DATA:  Weakness EXAM: PORTABLE CHEST 1 VIEW COMPARISON:  01/05/2017 FINDINGS: Linear densities right lung base compatible with scarring unchanged from prior studies. Negative for heart failure or pneumonia. Lungs are clear.  Atherosclerotic aortic arch. IMPRESSION: No active disease. Electronically Signed   By: Franchot Gallo M.D.   On: 01/21/2017 14:08    EKG: Independently reviewed. Sinus tachy at 108 bpm  Assessment/Plan Principal Problem:   Cellulitis of left foot and leg / leukocytosis/ hypotension/ tachycardia  - start Ancef - check venous duplex- on coumadin and therefore less likely he has DVT unless he was subtherapeutic recently  Active Problems: Hypotension/ near syncope and mild lactic acidosis - likely had orthostatic hypotension causing lightheadedness  - ? Sepsis related - is also on Lasix and therefore it is more likely that he is dehydrated - also on steroids at baseline and with acute cellulitis needs higher doses to prevent hypotension - BP improved with IVF in ER - start IV Hydrocortisone 50 TID - hold Lasix, Imdur and Losartan - NOTE: does not take Lisinopril which is on med rec    Hypothyroidism and Addison's syndrome - cont thyroid medication and steroids as mentioned above    CKD (chronic kidney disease) stage 3, GFR 30-59 ml/min - stable    Coronary artery disease involving native coronary artery of native heart without angina pectoris - holding Imdur - cont B blocker  H/o DVT/ PE - cont Coumadin- therapeutic   Depression - cont Wellbutrin and Lexapro    DVT prophylaxis: Coumadin Code Status: Full code  Family Communication: wife  Disposition Plan:   Consults called: none  Admission status: inpatient    Debbe Odea MD [facility] Pager: [email or web address] Password [email or web address] 7PM-7AM, please contact night-coverage   01/21/2017, 3:43 PM

## 2017-01-21 NOTE — ED Triage Notes (Signed)
Patient BIB EMS from home. Per spouse, patient has experienced multiple falls throughout the past couple days, everytime the patient stands. Per patient's wife, the patient "just isnt acting right." Per EMS, patient's BP upon arrival was 80s/40s. After 259mL bolus NS, patient's blood pressure increased to 90s/50s. Patient oxygen saturations were 88% on RA for EMS, 95% on 2LNC. Patient has recent diagnosis on 12/26 of PE and DVT. Patient is to follow up with PCP for INR check on 02/02/17. Patient is AxOx4. Patient complains of pain to left foot, which is noted to be red, hot to the touch, and swollen with yellow drainage in triage.

## 2017-01-21 NOTE — Progress Notes (Signed)
ANTICOAGULATION CONSULT NOTE - Initial Consult  Pharmacy Consult for coumadin Indication: hx DVT/PE on coumadinPTA  No Known Allergies  Patient Measurements: Height: 5\' 10"  (177.8 cm) Weight: 238 lb (108 kg) IBW/kg (Calculated) : 73   Vital Signs: Temp: 97.8 F (36.6 C) (12/28 1851) Temp Source: Oral (12/28 1851) BP: 138/66 (12/28 1851) Pulse Rate: 96 (12/28 1851)  Labs: Recent Labs    01/19/17 0937 01/21/17 1325  HGB  --  11.2*  HCT  --  34.4*  PLT  --  370  LABPROT  --  21.5*  INR 1.7 1.88  CREATININE  --  1.73*    Estimated Creatinine Clearance: 44.7 mL/min (A) (by C-G formula based on SCr of 1.73 mg/dL (H)).   Medical History: Past Medical History:  Diagnosis Date  . Acute lower GI bleeding    a. admitted to [facility] 04/11/2013;  b. 04/2014 EGD: 1.  gastric erosions, mild prox gastritis and bulbar duodenitis->Protonix.  . Addison's disease ([place])   . Anemia   . Anemia in CKD (chronic kidney disease) 06/25/2015  . Chronic back pain   . Chronic diastolic CHF (congestive heart failure) ([place])    a. His initial ejection fraction was between 30 and 35%;  b. 04/2013 Echo: EF 50-55% Gr1 DD, mild-mod MR;  c. 04/2014 Echo: EF 60-65%, Gr 1 DD, triv TR.  . CKD (chronic kidney disease), stage III ([place])   . Coronary artery disease    a. 07/2014 - s/p difficult PCI - had pressure wire analysis of LAD s/p DES in mid segment c/b wire-induced dissection in the distal segment of the LAD which was treated with repeated balloon inflations. Also had DES to rPDA - again had either spasm distal to the stent placement or an edge dissection but the vessel was small in that area; procedure aborted.  . Daily headache   . DVT (deep venous thrombosis) ([place])    a. 02/2013 superficial thrombophlebitis --> Xarelto later d/c'd 2/2 GIB;  b. 04/2014 Acute Right PT, Peroneal, Popliteal, Femoral, and common femoral DVT-->coumadin  . GERD (gastroesophageal reflux disease)   . H/O hiatal hernia   . Hepatitis    a. 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  . History of aortic insufficiency   . Hx of cardiovascular stress test    a. ETT-Myoview 7/14:  Low risk, inferior defect consistent with thinning, no ischemia, normal wall motion, EF 54%  . Hypertension   . Hypogonadism male   . Obesity   . On home oxygen therapy    a. added 04/2014 in setting of PE.  Marland Kitchen Peptic ulcer disease   . Pulmonary embolism ([place])    a. 04/2014 CTA: Bilat submassive PE ->coumadin.  . Pulmonary HTN ([place])   . Thyroid disease     Medications:  Medications Prior to Admission  Medication Sig Dispense Refill Last Dose  . CALCIUM PO Take by mouth.   01/20/2017 at Unknown time  . carvedilol (COREG) 6.25 MG tablet TAKE 1 TABLET TWICE A DAY WITH MEALS 180 tablet 3 01/17/2017  . Cholecalciferol (VITAMIN D3 PO) Take 1,000 capsules by mouth daily.    01/20/2017 at Unknown time  . ferrous sulfate 325 (65 FE) MG tablet Take 1 tablet (325 mg total) by mouth daily with breakfast. 30 tablet 0 01/21/2017 at Unknown time  . furosemide (LASIX) 40 MG tablet Take 1 tablet (40 mg total) by mouth 2 (two) times daily. 90 tablet 1 01/21/2017 at Unknown time  . gabapentin (NEURONTIN) 600 MG tablet  Take 600 mg by mouth daily.   01/20/2017 at Unknown time  . hydrocortisone (CORTEF) 20 MG tablet Take 20 mg by mouth. Taking three times daily 20mg /10mg /20mg    01/21/2017 at Unknown time  . ibuprofen (ADVIL,MOTRIN) 200 MG tablet Take 400 mg by mouth at bedtime as needed (PAIN).   01/20/2017 at Unknown time  . isosorbide mononitrate (IMDUR) 30 MG 24 hr tablet Take 0.5 tablets (15 mg total) by mouth daily.   01/21/2017 at Unknown time  . lisinopril (PRINIVIL,ZESTRIL) 10 MG tablet Take 0.5 tablets (5 mg total) by mouth daily. 45 tablet 1 01/21/2017 at Unknown time  . losartan (COZAAR) 25 MG tablet Take 25 mg by mouth daily.   01/21/2017 at Unknown time  . MAGNESIUM PO Take by mouth.   01/20/2017 at Unknown time  . metoCLOPramide (REGLAN) 5 MG tablet Take 5 mg by mouth daily.     01/21/2017 at Unknown time  . Multiple Vitamins-Minerals (ZINC PO) Take by mouth.   01/20/2017 at Unknown time  . nitroGLYCERIN (NITROSTAT) 0.4 MG SL tablet PLACE 1 TABLET UNDER TONGUE EVERY 5 MINUTES AS NEEDED FOR CHEST PAIN (UP TO 3 DOSES) 75 tablet 1 on hand  . pantoprazole (PROTONIX) 40 MG tablet Take 40 mg by mouth daily.   01/21/2017 at Unknown time  . potassium chloride SA (K-DUR,KLOR-CON) 20 MEQ tablet Take 20 mEq by mouth daily.   01/21/2017 at Unknown time  . PROAIR HFA 108 (90 Base) MCG/ACT inhaler INHALE 2 PUFFS INTO THE LUNGS EVERY 6 (SIX) HOURS AS NEEDED FOR WHEEZING OR SHORTNESS OF BREATH. 8.5 Inhaler 5 Past Month at Unknown time  . promethazine (PHENERGAN) 25 MG tablet Take 25 mg by mouth every 6 (six) hours as needed for nausea.    01/21/2017 at Unknown time  . thyroid (ARMOUR) 60 MG tablet Take 60 mg by mouth daily.    01/21/2017 at Unknown time  . traMADol (ULTRAM) 50 MG tablet Take 100 mg by mouth 3 (three) times daily.    01/21/2017 at Unknown time  . traZODone (DESYREL) 100 MG tablet Take 100 mg by mouth every evening.   2 01/20/2017 at Unknown time  . VITAMIN E PO Take by mouth.   01/20/2017 at Unknown time  . warfarin (COUMADIN) 3 MG tablet TAKE AS DIRECTED BY COOUMADIN CLINIC (Patient taking differently: TAKE 1.5 MGS ON THURSDAYS AND SUNDAYS AS DIRECTED BY COOUMADIN CLINIC) 7 tablet 0 01/20/2017 at 600 PM  . warfarin (COUMADIN) 3 MG tablet TAKE AS DIRECTED BY COOUMADIN CLINIC (Patient taking differently: TAKE 3 MGS ON MONDAYS, TUESDAYS, WEDNESDAYS, FRIDAYS AND SATURDAYS AS DIRECTED BY COOUMADIN CLINIC) 30 tablet 0 01/19/2017 at Unknown time  . buPROPion (WELLBUTRIN XL) 150 MG 24 hr tablet Take 1 tablet (150 mg total) by mouth daily. (Patient not taking: Reported on 01/21/2017) 30 tablet 6 Not Taking at Unknown time  . escitalopram (LEXAPRO) 10 MG tablet Take 1 tablet (10 mg total) by mouth daily. (Patient not taking: Reported on 01/21/2017) 30 tablet 11 Not Taking at Unknown  time  . folic acid (FOLVITE) 1 MG tablet Take 1 tablet (1 mg total) by mouth daily. (Patient not taking: Reported on 01/21/2017) 30 tablet 0 Not Taking at Unknown time    Assessment: 76 yo M on coumadin PTA for hx PE/DVT.  Coumadin clinic pt= last clinic visit 12/26 - take 4.5 mg today then 3 mg daily x 1.5 mg on Sundays and Thursdays.  Last dose 12/27 at 6 pm.  Admission INR below  goal at 1.88.  Hg 11.2, pltc WNL.  No bleeding reported.   Goal of Therapy:  INR 2-3 Monitor platelets by anticoagulation protocol: Yes   Plan:  Coumadin 4 mg po x 1 dose tonight Daily INR  Eudelia Bunch, Pharm.D. [phone] 01/21/2017 7:08 PM

## 2017-01-21 NOTE — Progress Notes (Signed)
Nurse discussed patients currnet illness, labs, history and telemetry order with [place] charge nurse, Wendie Chess. Nurse spoke with ED charge nurse, Catalina Antigua, and paged Dr. Wynelle Cleveland to verify appropriateness of patient coming to ortho floor. Matt in ED is redrawing patients lactic acid. Per Dr. Wynelle Cleveland patient is appropriate for ortho floor without telemetry. Patient has current PE/DVT according to Halina Maidens, RN note on 12/28 at 1:07. Patient has history of CHF and other cardiac history. Dr. Wynelle Cleveland is aware of nurses concern regarding severity of illness and patient history and assures nurse of patient being appropriate for an ortho floor.
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
## 2017-01-22 ENCOUNTER — Other Ambulatory Visit: Payer: Self-pay

## 2017-01-22 ENCOUNTER — Inpatient Hospital Stay (HOSPITAL_COMMUNITY): Payer: Medicare Other

## 2017-01-22 DIAGNOSIS — E038 Other specified hypothyroidism: Secondary | ICD-10-CM

## 2017-01-22 DIAGNOSIS — M7989 Other specified soft tissue disorders: Secondary | ICD-10-CM

## 2017-01-22 DIAGNOSIS — N183 Chronic kidney disease, stage 3 (moderate): Secondary | ICD-10-CM

## 2017-01-22 DIAGNOSIS — I2510 Atherosclerotic heart disease of native coronary artery without angina pectoris: Secondary | ICD-10-CM

## 2017-01-22 DIAGNOSIS — A419 Sepsis, unspecified organism: Principal | ICD-10-CM

## 2017-01-22 LAB — GLUCOSE, CAPILLARY
GLUCOSE-CAPILLARY: 248 mg/dL — AB (ref 65–99)
Glucose-Capillary: 166 mg/dL — ABNORMAL HIGH (ref 65–99)
Glucose-Capillary: 139 mg/dL — ABNORMAL HIGH (ref 65–99)

## 2017-01-22 LAB — BASIC METABOLIC PANEL
Anion gap: 11 (ref 5–15)
BUN: 19 mg/dL (ref 6–20)
CHLORIDE: 104 mmol/L (ref 101–111)
CO2: 22 mmol/L (ref 22–32)
CREATININE: 1.56 mg/dL — AB (ref 0.61–1.24)
Calcium: 8 mg/dL — ABNORMAL LOW (ref 8.9–10.3)
GFR calc Af Amer: 48 mL/min — ABNORMAL LOW (ref >60)
GFR calc non Af Amer: 41 mL/min — ABNORMAL LOW (ref >60)
GLUCOSE: 185 mg/dL — AB (ref 65–99)
Potassium: 3.6 mmol/L (ref 3.5–5.1)
SODIUM: 137 mmol/L (ref 135–145)

## 2017-01-22 LAB — CBC
HEMATOCRIT: 34.7 % — AB (ref 39.0–52.0)
Hemoglobin: 11 g/dL — ABNORMAL LOW (ref 13.0–17.0)
MCH: 26.3 pg (ref 26.0–34.0)
MCHC: 31.7 g/dL (ref 30.0–36.0)
MCV: 82.8 fL (ref 78.0–100.0)
PLATELETS: 382 10*3/uL (ref 150–400)
RBC: 4.19 MIL/uL — ABNORMAL LOW (ref 4.22–5.81)
RDW: 16.7 % — AB (ref 11.5–15.5)
WBC: 17.6 10*3/uL — AB (ref 4.0–10.5)

## 2017-01-22 LAB — PROTIME-INR
INR: 1.94
Prothrombin Time: 22 seconds — ABNORMAL HIGH (ref 11.4–15.2)

## 2017-01-22 MED ORDER — WARFARIN SODIUM 4 MG PO TABS
4.0000 mg | ORAL_TABLET | Freq: Once | ORAL | Status: AC
  Administered 2017-01-22: 17:00:00 4 mg via ORAL
  Filled 2017-01-22: qty 1

## 2017-01-22 MED ORDER — HYDROCORTISONE NA SUCCINATE PF 100 MG IJ SOLR
50.0000 mg | Freq: Two times a day (BID) | INTRAMUSCULAR | Status: DC
  Administered 2017-01-22 – 2017-01-24 (×5): 50 mg via INTRAVENOUS
  Filled 2017-01-22 (×6): qty 1

## 2017-01-22 NOTE — Progress Notes (Signed)
PROGRESS NOTE    [name]  JKD:[id number] DOB: 08/17/1940 DOA: 01/21/2017 PCP: Lawerance Cruel, MD      Brief Narrative:  This is a pleasant 76 yo M with systolic CHF, EF now 09%, CKD III baseline Cr 1.8, hx DVT/PE on warfarin, HTN and Addison's disease who presents with foot cellulitis and hypotension.  Started on Solu-cortef and Ancef.     Assessment & Plan:  Principal Problem:   Cellulitis of foot Active Problems:   Hypothyroidism   CKD (chronic kidney disease) stage 3, GFR 30-59 ml/min (HCC)   Chronic respiratory failure (HCC)   Coronary artery disease involving native coronary artery of native heart without angina pectoris   Hypotension   Cellulitis causing sepsis Lactate minimally elevated, BP resolved.  WBC improving.  Foot no change. -Continue Ancef -Follow-up ultrasound of the leg -Elevate exremity -Follow blood cultures  Hypotension Addison's disease -Hold home Cortef -Solu-cortef for 2 more days -Hold ImDur, losartan, Lasix  CKD III Baseline Cr 1.5-1.8. Stable  Anemia of chronic kidney disease Stable -Continue iron  Coronary artery disease Chronic systolic and diastolic CHF Hypertension Previous EF 30%, now resolved.  PCI in 2016.  Seems euvolemic -Hold Imdur, losartan, Lasix -Not on lisinopril (despite med rec) -Continue beta-blocker  History of DVT/PE -Continue warfarin, dosed per pharmacy  Hypothyroidism -Continue Armour Thyroid  Other medications: -Continue PPI -Continue gabapentin -Continue Lexapro and bupropion     DVT prophylaxis: N/A Code Status: FULL Family Communication: None present Disposition Plan: IV antibiotics for 2-3 more days, Solu-cortef.  Likely home in 2-3 days with HHPT   Consultants:   None  Procedures:   [place] pending  Antimicrobials:   Cefazolin    Subjective: Feels okay.  Foot no change.,  Still red, still painful.  No confusion, dizziness, fever, chest pain, abdominal  pain.  Objective: Vitals:   01/21/17 1803 01/21/17 1851 01/21/17 2159 01/22/17 0544  BP: 126/74 138/66 (!) 116/55 113/61  Pulse: 94 96 (!) 104 97  Resp: 18 18 20 18   Temp:  97.8 F (36.6 C) 99.3 F (37.4 C) 98.8 F (37.1 C)  TempSrc:  Oral Oral Oral  SpO2: 95% 100% 97% 98%  Weight:      Height:        Intake/Output Summary (Last 24 hours) at 01/22/2017 9833 Last data filed at 01/21/2017 1529 Gross per 24 hour  Intake 300 ml  Output -  Net 300 ml   Filed Weights   01/21/17 1322  Weight: 108 kg (238 lb)    Examination: General appearance: Overweight adult male, alert and in no acute distress.   HEENT: Anicteric, conjunctiva pink, lids and lashes normal. No nasal deformity, discharge, epistaxis.  Lips moist.   Skin: Warm and dry.  no jaundice.  No suspicious rashes or lesions. Cardiac: RRR, nl S1-S2, no murmurs appreciated.  Capillary refill is brisk.  JVP not visible.  1+ LE edema, 2-3+ on left.  Radial pulses 2+ and symmetric. Respiratory: Normal respiratory rate and rhythm.  CTAB without rales or wheezes. Abdomen: Abdomen soft.  No TTP. No ascites, distension, hepatosplenomegaly.   MSK: No deformities or effusions. Neuro: Awake and alert.  EOMI, moves all extremities. Speech fluent.    Psych: Sensorium intact and responding to questions, attention normal. Affect normal.  Judgment and insight appear normal.    Data Reviewed: I have personally reviewed following labs and imaging studies:  CBC: Recent Labs  Lab 01/21/17 1325 01/22/17 0530  WBC 21.3* 17.6*  NEUTROABS  16.8*  --   HGB 11.2* 11.0*  HCT 34.4* 34.7*  MCV 81.7 82.8  PLT 370 144   Basic Metabolic Panel: Recent Labs  Lab 01/21/17 1325 01/22/17 0530  NA 134* 137  K 3.4* 3.6  CL 104 104  CO2 22 22  GLUCOSE 212* 185*  BUN 21* 19  CREATININE 1.73* 1.56*  CALCIUM 7.9* 8.0*   GFR: Estimated Creatinine Clearance: 49.6 mL/min (A) (by C-G formula based on SCr of 1.56 mg/dL (H)). Liver Function  Tests: Recent Labs  Lab 01/21/17 1325  AST 31  ALT 36  ALKPHOS 81  BILITOT 1.8*  PROT 6.4*  ALBUMIN 2.8*   No results for input(s): LIPASE, AMYLASE in the last 168 hours. No results for input(s): AMMONIA in the last 168 hours. Coagulation Profile: Recent Labs  Lab 01/19/17 0937 01/21/17 1325  INR 1.7 1.88   Cardiac Enzymes: No results for input(s): CKTOTAL, CKMB, CKMBINDEX, TROPONINI in the last 168 hours. BNP (last 3 results) Recent Labs    05/19/16 1500  PROBNP 40   HbA1C: No results for input(s): HGBA1C in the last 72 hours. CBG: No results for input(s): GLUCAP in the last 168 hours. Lipid Profile: No results for input(s): CHOL, HDL, LDLCALC, TRIG, CHOLHDL, LDLDIRECT in the last 72 hours. Thyroid Function Tests: No results for input(s): TSH, T4TOTAL, FREET4, T3FREE, THYROIDAB in the last 72 hours. Anemia Panel: No results for input(s): VITAMINB12, FOLATE, FERRITIN, TIBC, IRON, RETICCTPCT in the last 72 hours. Urine analysis:    Component Value Date/Time   COLORURINE YELLOW 01/21/2017 1325   APPEARANCEUR CLEAR 01/21/2017 1325   LABSPEC 1.015 01/21/2017 1325   PHURINE 5.0 01/21/2017 1325   GLUCOSEU NEGATIVE 01/21/2017 1325   HGBUR NEGATIVE 01/21/2017 1325   BILIRUBINUR NEGATIVE 01/21/2017 1325   KETONESUR NEGATIVE 01/21/2017 1325   PROTEINUR 30 (A) 01/21/2017 1325   UROBILINOGEN 0.2 03/21/2007 1015   NITRITE NEGATIVE 01/21/2017 1325   LEUKOCYTESUR NEGATIVE 01/21/2017 1325   Sepsis Labs: @LABRCNTIP (procalcitonin:4,lacticacidven:4)  )No results found for this or any previous visit (from the past 240 hour(s)).       Radiology Studies: Ct Head Wo Contrast  Result Date: 01/21/2017 CLINICAL DATA:  Multiple recent falls EXAM: CT HEAD WITHOUT CONTRAST TECHNIQUE: Contiguous axial images were obtained from the base of the skull through the vertex without intravenous contrast. COMPARISON:  04/17/2016 FINDINGS: Brain: Mild atrophic changes are noted as well as  areas of chronic white matter ischemic change no findings to suggest acute hemorrhage, acute infarction or space-occupying mass lesion are noted. Vascular: No hyperdense vessel or unexpected calcification. Skull: Normal. Negative for fracture or focal lesion. Sinuses/Orbits: Diffuse mucosal thickening is noted within the right maxillary antrum. Other: None IMPRESSION: Chronic atrophic and ischemic changes without acute abnormality. Electronically Signed   By: Inez Catalina M.D.   On: 01/21/2017 15:58   Dg Chest Port 1 View  Result Date: 01/21/2017 CLINICAL DATA:  Weakness EXAM: PORTABLE CHEST 1 VIEW COMPARISON:  01/05/2017 FINDINGS: Linear densities right lung base compatible with scarring unchanged from prior studies. Negative for heart failure or pneumonia. Lungs are clear. Atherosclerotic aortic arch. IMPRESSION: No active disease. Electronically Signed   By: Franchot Gallo M.D.   On: 01/21/2017 14:08        Scheduled Meds: . carvedilol  6.25 mg Oral BID WC  . ferrous sulfate  325 mg Oral Q breakfast  . gabapentin  600 mg Oral Daily  . hydrocortisone sod succinate (SOLU-CORTEF) inj  50 mg Intravenous Q8H  .  sodium chloride flush  3 mL Intravenous Q12H  . thyroid  60 mg Oral QAC breakfast  . Warfarin - Pharmacist Dosing Inpatient   Does not apply q1800   Continuous Infusions: . sodium chloride    .  ceFAZolin (ANCEF) IV Stopped (01/22/17 7366)     LOS: 1 day    Time spent: 35 minutes    Edwin Dada, MD [facility] 01/22/2017, 9:23 AM     Pager [phone] --- please page though AMION:  [email or web address] If 7PM-7AM, please contact night-coverage

## 2017-01-22 NOTE — Progress Notes (Signed)
ANTICOAGULATION CONSULT NOTE - Follow-Up Consult  Pharmacy Consult for coumadin Indication: hx DVT/PE on coumadin PTA  No Known Allergies  Patient Measurements: Height: 5\' 10"  (177.8 cm) Weight: 238 lb (108 kg) IBW/kg (Calculated) : 73   Vital Signs: Temp: 98.8 F (37.1 C) (12/29 0544) Temp Source: Oral (12/29 0544) BP: 113/61 (12/29 0544) Pulse Rate: 97 (12/29 0544)  Labs: Recent Labs    01/21/17 1325 01/22/17 0530  HGB 11.2* 11.0*  HCT 34.4* 34.7*  PLT 370 382  LABPROT 21.5*  --   INR 1.88  --   CREATININE 1.73* 1.56*    Estimated Creatinine Clearance: 49.6 mL/min (A) (by C-G formula based on SCr of 1.56 mg/dL (H)).   Medical History: Past Medical History:  Diagnosis Date  . Acute lower GI bleeding    a. admitted to [facility] 04/11/2013;  b. 04/2014 EGD: 1.  gastric erosions, mild prox gastritis and bulbar duodenitis->Protonix.  . Addison's disease ([place])   . Anemia   . Anemia in CKD (chronic kidney disease) 06/25/2015  . Chronic back pain   . Chronic diastolic CHF (congestive heart failure) ([place])    a. His initial ejection fraction was between 30 and 35%;  b. 04/2013 Echo: EF 50-55% Gr1 DD, mild-mod MR;  c. 04/2014 Echo: EF 60-65%, Gr 1 DD, triv TR.  . CKD (chronic kidney disease), stage III ([place])   . Coronary artery disease    a. 07/2014 - s/p difficult PCI - had pressure wire analysis of LAD s/p DES in mid segment c/b wire-induced dissection in the distal segment of the LAD which was treated with repeated balloon inflations. Also had DES to rPDA - again had either spasm distal to the stent placement or an edge dissection but the vessel was small in that area; procedure aborted.  . Daily headache   . DVT (deep venous thrombosis) ([place])    a. 02/2013 superficial thrombophlebitis --> Xarelto later d/c'd 2/2 GIB;  b. 04/2014 Acute Right PT, Peroneal, Popliteal, Femoral, and common femoral DVT-->coumadin  . GERD (gastroesophageal reflux disease)   . H/O hiatal hernia   .  Hepatitis    a. 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  . History of aortic insufficiency   . Hx of cardiovascular stress test    a. ETT-Myoview 7/14:  Low risk, inferior defect consistent with thinning, no ischemia, normal wall motion, EF 54%  . Hypertension   . Hypogonadism male   . Obesity   . On home oxygen therapy    a. added 04/2014 in setting of PE.  Marland Kitchen Peptic ulcer disease   . Pulmonary embolism ([place])    a. 04/2014 CTA: Bilat submassive PE ->coumadin.  . Pulmonary HTN ([place])   . Thyroid disease     Medications:  Medications Prior to Admission  Medication Sig Dispense Refill Last Dose  . CALCIUM PO Take by mouth.   01/20/2017 at Unknown time  . carvedilol (COREG) 6.25 MG tablet TAKE 1 TABLET TWICE A DAY WITH MEALS 180 tablet 3 01/17/2017  . Cholecalciferol (VITAMIN D3 PO) Take 1,000 capsules by mouth daily.    01/20/2017 at Unknown time  . ferrous sulfate 325 (65 FE) MG tablet Take 1 tablet (325 mg total) by mouth daily with breakfast. 30 tablet 0 01/21/2017 at Unknown time  . furosemide (LASIX) 40 MG tablet Take 1 tablet (40 mg total) by mouth 2 (two) times daily. 90 tablet 1 01/21/2017 at Unknown time  . gabapentin (NEURONTIN) 600 MG tablet Take 600 mg by  mouth daily.   01/20/2017 at Unknown time  . hydrocortisone (CORTEF) 20 MG tablet Take 20 mg by mouth. Taking three times daily 20mg /10mg /20mg    01/21/2017 at Unknown time  . ibuprofen (ADVIL,MOTRIN) 200 MG tablet Take 400 mg by mouth at bedtime as needed (PAIN).   01/20/2017 at Unknown time  . isosorbide mononitrate (IMDUR) 30 MG 24 hr tablet Take 0.5 tablets (15 mg total) by mouth daily.   01/21/2017 at Unknown time  . lisinopril (PRINIVIL,ZESTRIL) 10 MG tablet Take 0.5 tablets (5 mg total) by mouth daily. 45 tablet 1 01/21/2017 at Unknown time  . losartan (COZAAR) 25 MG tablet Take 25 mg by mouth daily.   01/21/2017 at Unknown time  . MAGNESIUM PO Take by mouth.   01/20/2017 at Unknown time  . metoCLOPramide (REGLAN) 5 MG tablet Take 5 mg by mouth  daily.    01/21/2017 at Unknown time  . Multiple Vitamins-Minerals (ZINC PO) Take by mouth.   01/20/2017 at Unknown time  . nitroGLYCERIN (NITROSTAT) 0.4 MG SL tablet PLACE 1 TABLET UNDER TONGUE EVERY 5 MINUTES AS NEEDED FOR CHEST PAIN (UP TO 3 DOSES) 75 tablet 1 on hand  . pantoprazole (PROTONIX) 40 MG tablet Take 40 mg by mouth daily.   01/21/2017 at Unknown time  . potassium chloride SA (K-DUR,KLOR-CON) 20 MEQ tablet Take 20 mEq by mouth daily.   01/21/2017 at Unknown time  . PROAIR HFA 108 (90 Base) MCG/ACT inhaler INHALE 2 PUFFS INTO THE LUNGS EVERY 6 (SIX) HOURS AS NEEDED FOR WHEEZING OR SHORTNESS OF BREATH. 8.5 Inhaler 5 Past Month at Unknown time  . promethazine (PHENERGAN) 25 MG tablet Take 25 mg by mouth every 6 (six) hours as needed for nausea.    01/21/2017 at Unknown time  . thyroid (ARMOUR) 60 MG tablet Take 60 mg by mouth daily.    01/21/2017 at Unknown time  . traMADol (ULTRAM) 50 MG tablet Take 100 mg by mouth 3 (three) times daily.    01/21/2017 at Unknown time  . traZODone (DESYREL) 100 MG tablet Take 100 mg by mouth every evening.   2 01/20/2017 at Unknown time  . VITAMIN E PO Take by mouth.   01/20/2017 at Unknown time  . warfarin (COUMADIN) 3 MG tablet TAKE AS DIRECTED BY COOUMADIN CLINIC (Patient taking differently: TAKE 1.5 MGS ON THURSDAYS AND SUNDAYS AS DIRECTED BY COOUMADIN CLINIC) 7 tablet 0 01/20/2017 at 600 PM  . warfarin (COUMADIN) 3 MG tablet TAKE AS DIRECTED BY COOUMADIN CLINIC (Patient taking differently: TAKE 3 MGS ON MONDAYS, TUESDAYS, WEDNESDAYS, FRIDAYS AND SATURDAYS AS DIRECTED BY COOUMADIN CLINIC) 30 tablet 0 01/19/2017 at Unknown time  . buPROPion (WELLBUTRIN XL) 150 MG 24 hr tablet Take 1 tablet (150 mg total) by mouth daily. (Patient not taking: Reported on 01/21/2017) 30 tablet 6 Not Taking at Unknown time  . escitalopram (LEXAPRO) 10 MG tablet Take 1 tablet (10 mg total) by mouth daily. (Patient not taking: Reported on 01/21/2017) 30 tablet 11 Not Taking at  Unknown time  . folic acid (FOLVITE) 1 MG tablet Take 1 tablet (1 mg total) by mouth daily. (Patient not taking: Reported on 01/21/2017) 30 tablet 0 Not Taking at Unknown time    Assessment: 76 yo M on coumadin PTA for hx PE/DVT.  Coumadin clinic pt= last clinic visit 12/26 - take 4.5 mg today then 3 mg daily x 1.5 mg on Sundays and Thursdays.  Last dose 12/27 at 6 pm.  Admission INR below goal at 1.88.  Hg 11.2, pltc WNL.  No bleeding reported.   Today, 01/22/17   INR 1.94, PT 22, subtherapetic, trending up  Hgb 11.0, plt 382  No bleeding issues noted     Goal of Therapy:  INR 2-3 Monitor platelets by anticoagulation protocol: Yes   Plan:   Coumadin 4 mg po x 1 dose tonight  Daily INR  Monitor for signs and symptoms of bleeding   Royetta Asal, PharmD, BCPS Pager [phone] 01/22/2017 9:52 AM

## 2017-01-22 NOTE — Progress Notes (Signed)
*  Preliminary Results* Left lower extremity venous duplex completed. Left lower extremity is negative for deep vein thrombosis. There is no evidence of left Baker's cyst.  01/22/2017 9:51 AM  Maudry Mayhew, BS, RVT, RDCS, RDMS

## 2017-01-23 ENCOUNTER — Other Ambulatory Visit: Payer: Self-pay

## 2017-01-23 LAB — CBC
HCT: 32.4 % — ABNORMAL LOW (ref 39.0–52.0)
HEMOGLOBIN: 10.5 g/dL — AB (ref 13.0–17.0)
MCH: 26.7 pg (ref 26.0–34.0)
MCHC: 32.4 g/dL (ref 30.0–36.0)
MCV: 82.4 fL (ref 78.0–100.0)
PLATELETS: 379 10*3/uL (ref 150–400)
RBC: 3.93 MIL/uL — AB (ref 4.22–5.81)
RDW: 16.8 % — ABNORMAL HIGH (ref 11.5–15.5)
WBC: 17.4 10*3/uL — AB (ref 4.0–10.5)

## 2017-01-23 LAB — BASIC METABOLIC PANEL
ANION GAP: 9 (ref 5–15)
BUN: 25 mg/dL — ABNORMAL HIGH (ref 6–20)
CALCIUM: 8 mg/dL — AB (ref 8.9–10.3)
CO2: 22 mmol/L (ref 22–32)
CREATININE: 1.51 mg/dL — AB (ref 0.61–1.24)
Chloride: 107 mmol/L (ref 101–111)
GFR, EST AFRICAN AMERICAN: 50 mL/min — AB (ref >60)
GFR, EST NON AFRICAN AMERICAN: 43 mL/min — AB (ref >60)
Glucose, Bld: 190 mg/dL — ABNORMAL HIGH (ref 65–99)
Potassium: 3.4 mmol/L — ABNORMAL LOW (ref 3.5–5.1)
SODIUM: 138 mmol/L (ref 135–145)

## 2017-01-23 LAB — GLUCOSE, CAPILLARY
GLUCOSE-CAPILLARY: 172 mg/dL — AB (ref 65–99)
GLUCOSE-CAPILLARY: 198 mg/dL — AB (ref 65–99)

## 2017-01-23 LAB — PROTIME-INR
INR: 2.29
PROTHROMBIN TIME: 25 s — AB (ref 11.4–15.2)

## 2017-01-23 MED ORDER — ISOSORBIDE MONONITRATE ER 30 MG PO TB24
15.0000 mg | ORAL_TABLET | Freq: Every day | ORAL | Status: DC
  Administered 2017-01-23 – 2017-01-25 (×3): 15 mg via ORAL
  Filled 2017-01-23 (×3): qty 1

## 2017-01-23 MED ORDER — WARFARIN SODIUM 3 MG PO TABS
3.0000 mg | ORAL_TABLET | Freq: Once | ORAL | Status: AC
  Administered 2017-01-23: 3 mg via ORAL
  Filled 2017-01-23: qty 1

## 2017-01-23 NOTE — Progress Notes (Signed)
PT Cancellation Note  Patient Details Name: DIO GILLER MRN: [id number] DOB: 1940/02/29   Cancelled Treatment:    Reason Eval/Treat Not Completed: Other (comment)patient noted up to bed side multiple times, noted to stand and climb into bed. Currently has dinner. Will check back in AM for complete evaluation.   Claretha Cooper 01/23/2017, 4:43 PM  Tresa Endo PT [phone]

## 2017-01-23 NOTE — Progress Notes (Signed)
ANTICOAGULATION CONSULT NOTE - Follow-Up Consult  Pharmacy Consult for coumadin Indication: hx DVT/PE on coumadin PTA  No Known Allergies  Patient Measurements: Height: 5\' 10"  (177.8 cm) Weight: 238 lb (108 kg) IBW/kg (Calculated) : 73   Vital Signs: Temp: 98.6 F (37 C) (12/30 0532) Temp Source: Oral (12/30 0532) BP: 138/84 (12/30 0532) Pulse Rate: 90 (12/30 0532)  Labs: Recent Labs    01/21/17 1325 01/22/17 0530 01/22/17 1009 01/23/17 0539  HGB 11.2* 11.0*  --  10.5*  HCT 34.4* 34.7*  --  32.4*  PLT 370 382  --  379  LABPROT 21.5*  --  22.0* 25.0*  INR 1.88  --  1.94 2.29  CREATININE 1.73* 1.56*  --  1.51*    Estimated Creatinine Clearance: 51.2 mL/min (A) (by C-G formula based on SCr of 1.51 mg/dL (H)).   Medical History: Past Medical History:  Diagnosis Date  . Acute lower GI bleeding    a. admitted to [facility] 04/11/2013;  b. 04/2014 EGD: 1.  gastric erosions, mild prox gastritis and bulbar duodenitis->Protonix.  . Addison's disease ([place])   . Anemia   . Anemia in CKD (chronic kidney disease) 06/25/2015  . Chronic back pain   . Chronic diastolic CHF (congestive heart failure) ([place])    a. His initial ejection fraction was between 30 and 35%;  b. 04/2013 Echo: EF 50-55% Gr1 DD, mild-mod MR;  c. 04/2014 Echo: EF 60-65%, Gr 1 DD, triv TR.  . CKD (chronic kidney disease), stage III ([place])   . Coronary artery disease    a. 07/2014 - s/p difficult PCI - had pressure wire analysis of LAD s/p DES in mid segment c/b wire-induced dissection in the distal segment of the LAD which was treated with repeated balloon inflations. Also had DES to rPDA - again had either spasm distal to the stent placement or an edge dissection but the vessel was small in that area; procedure aborted.  . Daily headache   . DVT (deep venous thrombosis) ([place])    a. 02/2013 superficial thrombophlebitis --> Xarelto later d/c'd 2/2 GIB;  b. 04/2014 Acute Right PT, Peroneal, Popliteal, Femoral, and common femoral  DVT-->coumadin  . GERD (gastroesophageal reflux disease)   . H/O hiatal hernia   . Hepatitis    a. 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  . History of aortic insufficiency   . Hx of cardiovascular stress test    a. ETT-Myoview 7/14:  Low risk, inferior defect consistent with thinning, no ischemia, normal wall motion, EF 54%  . Hypertension   . Hypogonadism male   . Obesity   . On home oxygen therapy    a. added 04/2014 in setting of PE.  Marland Kitchen Peptic ulcer disease   . Pulmonary embolism ([place])    a. 04/2014 CTA: Bilat submassive PE ->coumadin.  . Pulmonary HTN ([place])   . Thyroid disease     Medications:  Medications Prior to Admission  Medication Sig Dispense Refill Last Dose  . CALCIUM PO Take by mouth.   01/20/2017 at Unknown time  . carvedilol (COREG) 6.25 MG tablet TAKE 1 TABLET TWICE A DAY WITH MEALS 180 tablet 3 01/17/2017  . Cholecalciferol (VITAMIN D3 PO) Take 1,000 capsules by mouth daily.    01/20/2017 at Unknown time  . ferrous sulfate 325 (65 FE) MG tablet Take 1 tablet (325 mg total) by mouth daily with breakfast. 30 tablet 0 01/21/2017 at Unknown time  . furosemide (LASIX) 40 MG tablet Take 1 tablet (40 mg total)  by mouth 2 (two) times daily. 90 tablet 1 01/21/2017 at Unknown time  . gabapentin (NEURONTIN) 600 MG tablet Take 600 mg by mouth daily.   01/20/2017 at Unknown time  . hydrocortisone (CORTEF) 20 MG tablet Take 20 mg by mouth. Taking three times daily 20mg /10mg /20mg    01/21/2017 at Unknown time  . ibuprofen (ADVIL,MOTRIN) 200 MG tablet Take 400 mg by mouth at bedtime as needed (PAIN).   01/20/2017 at Unknown time  . isosorbide mononitrate (IMDUR) 30 MG 24 hr tablet Take 0.5 tablets (15 mg total) by mouth daily.   01/21/2017 at Unknown time  . lisinopril (PRINIVIL,ZESTRIL) 10 MG tablet Take 0.5 tablets (5 mg total) by mouth daily. 45 tablet 1 01/21/2017 at Unknown time  . losartan (COZAAR) 25 MG tablet Take 25 mg by mouth daily.   01/21/2017 at Unknown time  . MAGNESIUM PO Take by mouth.    01/20/2017 at Unknown time  . metoCLOPramide (REGLAN) 5 MG tablet Take 5 mg by mouth daily.    01/21/2017 at Unknown time  . Multiple Vitamins-Minerals (ZINC PO) Take by mouth.   01/20/2017 at Unknown time  . nitroGLYCERIN (NITROSTAT) 0.4 MG SL tablet PLACE 1 TABLET UNDER TONGUE EVERY 5 MINUTES AS NEEDED FOR CHEST PAIN (UP TO 3 DOSES) 75 tablet 1 on hand  . pantoprazole (PROTONIX) 40 MG tablet Take 40 mg by mouth daily.   01/21/2017 at Unknown time  . potassium chloride SA (K-DUR,KLOR-CON) 20 MEQ tablet Take 20 mEq by mouth daily.   01/21/2017 at Unknown time  . PROAIR HFA 108 (90 Base) MCG/ACT inhaler INHALE 2 PUFFS INTO THE LUNGS EVERY 6 (SIX) HOURS AS NEEDED FOR WHEEZING OR SHORTNESS OF BREATH. 8.5 Inhaler 5 Past Month at Unknown time  . promethazine (PHENERGAN) 25 MG tablet Take 25 mg by mouth every 6 (six) hours as needed for nausea.    01/21/2017 at Unknown time  . thyroid (ARMOUR) 60 MG tablet Take 60 mg by mouth daily.    01/21/2017 at Unknown time  . traMADol (ULTRAM) 50 MG tablet Take 100 mg by mouth 3 (three) times daily.    01/21/2017 at Unknown time  . traZODone (DESYREL) 100 MG tablet Take 100 mg by mouth every evening.   2 01/20/2017 at Unknown time  . VITAMIN E PO Take by mouth.   01/20/2017 at Unknown time  . warfarin (COUMADIN) 3 MG tablet TAKE AS DIRECTED BY COOUMADIN CLINIC (Patient taking differently: TAKE 1.5 MGS ON THURSDAYS AND SUNDAYS AS DIRECTED BY COOUMADIN CLINIC) 7 tablet 0 01/20/2017 at 600 PM  . warfarin (COUMADIN) 3 MG tablet TAKE AS DIRECTED BY COOUMADIN CLINIC (Patient taking differently: TAKE 3 MGS ON MONDAYS, TUESDAYS, WEDNESDAYS, FRIDAYS AND SATURDAYS AS DIRECTED BY COOUMADIN CLINIC) 30 tablet 0 01/19/2017 at Unknown time  . buPROPion (WELLBUTRIN XL) 150 MG 24 hr tablet Take 1 tablet (150 mg total) by mouth daily. (Patient not taking: Reported on 01/21/2017) 30 tablet 6 Not Taking at Unknown time  . escitalopram (LEXAPRO) 10 MG tablet Take 1 tablet (10 mg total) by  mouth daily. (Patient not taking: Reported on 01/21/2017) 30 tablet 11 Not Taking at Unknown time  . folic acid (FOLVITE) 1 MG tablet Take 1 tablet (1 mg total) by mouth daily. (Patient not taking: Reported on 01/21/2017) 30 tablet 0 Not Taking at Unknown time    Assessment: 76 yo M on coumadin PTA for hx PE/DVT.  Coumadin clinic pt= last clinic visit 12/26 - take 4.5 mg today then resume  3 mg daily except 1.5 mg on Sundays and Thursdays.  Last dose 12/27 at 6 pm.  Admission INR below goal at 1.88.    Today, 01/23/17   INR therapeutic at 2.29  Hgb low but stable. Plts WNL.  No bleeding issues noted   Goal of Therapy:  INR 2-3 Monitor platelets by anticoagulation protocol: Yes   Plan:   Coumadin 3 mg po x 1 dose tonight  Daily INR  Monitor for signs and symptoms of bleeding  Hershal Coria, PharmD, BCPS Pager: [phone] 01/23/2017 12:19 PM

## 2017-01-23 NOTE — Progress Notes (Addendum)
Nutrition Brief Note  Patient identified on the Malnutrition Screening Tool (MST) Report  Wt Readings from Last 15 Encounters:  01/21/17 238 lb (108 kg)  09/08/16 238 lb 8 oz (108.2 kg)  07/26/16 238 lb (108 kg)  05/19/16 247 lb (112 kg)  11/24/15 240 lb (108.9 kg)  11/21/15 244 lb 12 oz (111 kg)  11/17/15 243 lb 7 oz (110.4 kg)  10/29/15 244 lb 6.4 oz (110.9 kg)  10/23/15 246 lb 1.9 oz (111.6 kg)  08/18/15 231 lb 6.4 oz (105 kg)  06/24/15 233 lb 12.8 oz (106.1 kg)  06/10/15 228 lb (103.4 kg)  06/09/15 231 lb 12.8 oz (105.1 kg)  06/04/15 233 lb 3.2 oz (105.8 kg)  04/30/15 223 lb (101.2 kg)    Body mass index is 34.15 kg/m. Patient meets criteria for obesity based on current BMI. Pt reports a UBW of 220-230 lbs, stating last time he weighed "closer to 230 lb.  Current diet order is heart healthy, patient is consuming approximately 100% of meals at this time. Pt reports enjoying all meals. Pt reports a decrease in appetite PTA r/t nausea but states this has resolved. Pt reports no other nutrition impact symptoms at this time.   Nutrition-focused physical exam completed. Findings include no muscle or subcutaneous fat depletions.  Labs and medications reviewed.   No nutrition interventions warranted at this time. If nutrition issues arise, please consult RD.   [place] Ranger, MS, RDN, LDN 01/23/2017 2:17 PM

## 2017-01-23 NOTE — Progress Notes (Signed)
PROGRESS NOTE    [name]  YKZ:[id number] DOB: Aug 11, 1940 DOA: 01/21/2017 PCP: Lawerance Cruel, MD      Brief Narrative:  This is a pleasant 76 yo M with systolic CHF, EF now 93%, CKD III baseline Cr 1.8, hx DVT/PE on warfarin, HTN and Addison's disease who presents with foot cellulitis and hypotension.  Started on Solu-cortef and Ancef.     Assessment & Plan:  Principal Problem:   Cellulitis of foot Active Problems:   Hypothyroidism   CKD (chronic kidney disease) stage 3, GFR 30-59 ml/min (HCC)   Chronic respiratory failure (HCC)   Coronary artery disease involving native coronary artery of native heart without angina pectoris   Hypotension   Cellulitis causing sepsis Lactate minimally elevated, BP resolved.  WBC improving.  Foot slightly better.  Ultrasound negative for DVT. -Continue Ancef -Elevate exremity -Follow blood cultures   Hypotension Addison's disease -Hold home Cortef -Solu-cortef for 1 more day -Hold losartan, Lasix -Restart Imdur  Coronary artery disease Chronic systolic and diastolic CHF Hypertension Previous EF 30%, now resolved.  PCI in 2016.  Seems euvolemic -Hold losartan, Lasix -Restart Imdur -Not on lisinopril (despite med rec) -Continue beta-blocker  History of DVT/PE -Continue warfarin, dosed per pharmacy  Hypothyroidism -Continue Armour Thyroid  Other medications: -Continue PPI -Continue gabapentin -Continue Lexapro and bupropion  Chronic respiratory failure with hypoxia Patient tells me his PCP put him on oxygen "years ago" and he still has but doesn't ever use. He doesn't know why, but likely from CHF EF 30% and mild COPD per PFTs by Dr. Melvyn Novas.  Nursing report he is intermittently hypoxic. -Trial off O2 -If needs O2 with rest or with ambulation, will restart at discharge  CKD III Baseline Cr 1.5-1.8. Stable  Anemia of chronic kidney disease Stable -Continue iron      DVT prophylaxis: N/A Code Status:  FULL Family Communication: None present Disposition Plan: IV antibiotics for 1-2 more days, Solu-cortef one more day.  Likely home tomorrow with PT vs to SNF.  PT eval today.   Consultants:   None  Procedures:   US duplex normal  Antimicrobials:   Cefazolin    Subjective: Feels okay.  Foot slightly better, no pain, still red, swollen.  No confusion, dizziness, fever, chest pain, abdominal pain.  Objective: Vitals:   01/22/17 0544 01/22/17 1447 01/22/17 2103 01/23/17 0532  BP: 113/61 122/64 (!) 158/64 138/84  Pulse: 97 96 93 90  Resp: 18 18 20 18   Temp: 98.8 F (37.1 C) 98.5 F (36.9 C) 99.2 F (37.3 C) 98.6 F (37 C)  TempSrc: Oral Oral Oral Oral  SpO2: 98% 94% 94% 95%  Weight:      Height:        Intake/Output Summary (Last 24 hours) at 01/23/2017 0918 Last data filed at 01/23/2017 0535 Gross per 24 hour  Intake 460 ml  Output 702 ml  Net -242 ml   Filed Weights   01/21/17 1322  Weight: 108 kg (238 lb)    Examination: General appearance: Overweight adult male, alert and in no acute distress.   HEENT: Anicteric, conjunctiva pink, lids and lashes normal. No nasal deformity, discharge, epistaxis.  Lips moist.   Skin: Warm and dry.  no jaundice.  The left foot is puffy and red and tender. Cardiac: RRR, nl S1-S2, no murmurs appreciated.  Capillary refill is brisk.  JVP not visible.  1+ LE edema, 2-3+ on left.  Radial pulses 2+ and symmetric. Respiratory: Normal respiratory rate and  rhythm.  CTAB without rales or wheezes. Abdomen: Abdomen soft.  No TTP. No ascites, distension, hepatosplenomegaly.   MSK: No deformities or effusions. Neuro: Awake and alert.  EOMI, moves all extremities. Speech fluent.    Psych: Sensorium intact and responding to questions, attention normal. Affect normal.  Judgment and insight appear normal.    Data Reviewed: I have personally reviewed following labs and imaging studies:  CBC: Recent Labs  Lab 01/21/17 1325 01/22/17 0530  01/23/17 0539  WBC 21.3* 17.6* 17.4*  NEUTROABS 16.8*  --   --   HGB 11.2* 11.0* 10.5*  HCT 34.4* 34.7* 32.4*  MCV 81.7 82.8 82.4  PLT 370 382 177   Basic Metabolic Panel: Recent Labs  Lab 01/21/17 1325 01/22/17 0530 01/23/17 0539  NA 134* 137 138  K 3.4* 3.6 3.4*  CL 104 104 107  CO2 22 22 22   GLUCOSE 212* 185* 190*  BUN 21* 19 25*  CREATININE 1.73* 1.56* 1.51*  CALCIUM 7.9* 8.0* 8.0*   GFR: Estimated Creatinine Clearance: 51.2 mL/min (A) (by C-G formula based on SCr of 1.51 mg/dL (H)). Liver Function Tests: Recent Labs  Lab 01/21/17 1325  AST 31  ALT 36  ALKPHOS 81  BILITOT 1.8*  PROT 6.4*  ALBUMIN 2.8*   No results for input(s): LIPASE, AMYLASE in the last 168 hours. No results for input(s): AMMONIA in the last 168 hours. Coagulation Profile: Recent Labs  Lab 01/19/17 0937 01/21/17 1325 01/22/17 1009 01/23/17 0539  INR 1.7 1.88 1.94 2.29   Cardiac Enzymes: No results for input(s): CKTOTAL, CKMB, CKMBINDEX, TROPONINI in the last 168 hours. BNP (last 3 results) Recent Labs    05/19/16 1500  PROBNP 40   HbA1C: No results for input(s): HGBA1C in the last 72 hours. CBG: Recent Labs  Lab 01/22/17 1251 01/22/17 1701 01/22/17 2139 01/23/17 0722  GLUCAP 248* 166* 139* 172*   Lipid Profile: No results for input(s): CHOL, HDL, LDLCALC, TRIG, CHOLHDL, LDLDIRECT in the last 72 hours. Thyroid Function Tests: No results for input(s): TSH, T4TOTAL, FREET4, T3FREE, THYROIDAB in the last 72 hours. Anemia Panel: No results for input(s): VITAMINB12, FOLATE, FERRITIN, TIBC, IRON, RETICCTPCT in the last 72 hours. Urine analysis:    Component Value Date/Time   COLORURINE YELLOW 01/21/2017 1325   APPEARANCEUR CLEAR 01/21/2017 1325   LABSPEC 1.015 01/21/2017 1325   PHURINE 5.0 01/21/2017 1325   GLUCOSEU NEGATIVE 01/21/2017 1325   HGBUR NEGATIVE 01/21/2017 1325   BILIRUBINUR NEGATIVE 01/21/2017 1325   KETONESUR NEGATIVE 01/21/2017 1325   PROTEINUR 30 (A)  01/21/2017 1325   UROBILINOGEN 0.2 03/21/2007 1015   NITRITE NEGATIVE 01/21/2017 1325   LEUKOCYTESUR NEGATIVE 01/21/2017 1325   Sepsis Labs: @LABRCNTIP (procalcitonin:4,lacticacidven:4)  ) Recent Results (from the past 240 hour(s))  Blood Culture (routine x 2)     Status: None (Preliminary result)   Collection Time: 01/21/17 12:43 PM  Result Value Ref Range Status   Specimen Description BLOOD RIGHT ANTECUBITAL  Final   Special Requests   Final    BOTTLES DRAWN AEROBIC AND ANAEROBIC Blood Culture adequate volume   Culture   Final    NO GROWTH < 24 HOURS Performed at [facility], [place] [address], [place], [place] [address]    Report Status PENDING  Incomplete  Blood Culture (routine x 2)     Status: None (Preliminary result)   Collection Time: 01/21/17  1:51 PM  Result Value Ref Range Status   Specimen Description BLOOD RIGHT HAND  Final  Special Requests   Final    BOTTLES DRAWN AEROBIC AND ANAEROBIC Blood Culture adequate volume   Culture   Final    NO GROWTH < 24 HOURS Performed at [facility], [place] [address] [place], [place] [address]    Report Status PENDING  Incomplete         Radiology Studies: Ct Head Wo Contrast  Result Date: 01/21/2017 CLINICAL DATA:  Multiple recent falls EXAM: CT HEAD WITHOUT CONTRAST TECHNIQUE: Contiguous axial images were obtained from the base of the skull through the vertex without intravenous contrast. COMPARISON:  04/17/2016 FINDINGS: Brain: Mild atrophic changes are noted as well as areas of chronic white matter ischemic change no findings to suggest acute hemorrhage, acute infarction or space-occupying mass lesion are noted. Vascular: No hyperdense vessel or unexpected calcification. Skull: Normal. Negative for fracture or focal lesion. Sinuses/Orbits: Diffuse mucosal thickening is noted within the right maxillary antrum. Other: None IMPRESSION: Chronic atrophic and ischemic changes without acute abnormality. Electronically  Signed   By: Inez Catalina M.D.   On: 01/21/2017 15:58   Dg Chest Port 1 View  Result Date: 01/21/2017 CLINICAL DATA:  Weakness EXAM: PORTABLE CHEST 1 VIEW COMPARISON:  01/05/2017 FINDINGS: Linear densities right lung base compatible with scarring unchanged from prior studies. Negative for heart failure or pneumonia. Lungs are clear. Atherosclerotic aortic arch. IMPRESSION: No active disease. Electronically Signed   By: Franchot Gallo M.D.   On: 01/21/2017 14:08        Scheduled Meds: . carvedilol  6.25 mg Oral BID WC  . ferrous sulfate  325 mg Oral Q breakfast  . gabapentin  600 mg Oral Daily  . hydrocortisone sod succinate (SOLU-CORTEF) inj  50 mg Intravenous Q12H  . sodium chloride flush  3 mL Intravenous Q12H  . thyroid  60 mg Oral QAC breakfast  . Warfarin - Pharmacist Dosing Inpatient   Does not apply q1800   Continuous Infusions: . sodium chloride    .  ceFAZolin (ANCEF) IV 2 g (01/23/17 0625)     LOS: 2 days    Time spent: 25 minutes    Edwin Dada, MD [facility] 01/23/2017, 9:18 AM     Pager [phone] --- please page though AMION:  [email or web address] Password [email or web address] If 7PM-7AM, please contact night-coverage

## 2017-01-23 NOTE — Progress Notes (Signed)
Patient has been impulsive, forgetful, and attempted to get out of bed multiple times throughout the day. Bed alarm on but patient very fast at getting out of bed. Pt is unsteady on feet. Read PT note. Safety sitter requested.

## 2017-01-24 DIAGNOSIS — L03119 Cellulitis of unspecified part of limb: Secondary | ICD-10-CM

## 2017-01-24 LAB — BASIC METABOLIC PANEL
ANION GAP: 9 (ref 5–15)
BUN: 23 mg/dL — ABNORMAL HIGH (ref 6–20)
CHLORIDE: 107 mmol/L (ref 101–111)
CO2: 22 mmol/L (ref 22–32)
CREATININE: 1.32 mg/dL — AB (ref 0.61–1.24)
Calcium: 8.2 mg/dL — ABNORMAL LOW (ref 8.9–10.3)
GFR calc non Af Amer: 51 mL/min — ABNORMAL LOW (ref >60)
GFR, EST AFRICAN AMERICAN: 59 mL/min — AB (ref >60)
Glucose, Bld: 162 mg/dL — ABNORMAL HIGH (ref 65–99)
POTASSIUM: 3.7 mmol/L (ref 3.5–5.1)
SODIUM: 138 mmol/L (ref 135–145)

## 2017-01-24 LAB — PROTIME-INR
INR: 2.6
Prothrombin Time: 27.6 seconds — ABNORMAL HIGH (ref 11.4–15.2)

## 2017-01-24 LAB — CBC
HCT: 33.6 % — ABNORMAL LOW (ref 39.0–52.0)
HEMOGLOBIN: 10.6 g/dL — AB (ref 13.0–17.0)
MCH: 26.2 pg (ref 26.0–34.0)
MCHC: 31.5 g/dL (ref 30.0–36.0)
MCV: 83.2 fL (ref 78.0–100.0)
PLATELETS: 386 10*3/uL (ref 150–400)
RBC: 4.04 MIL/uL — AB (ref 4.22–5.81)
RDW: 16.7 % — ABNORMAL HIGH (ref 11.5–15.5)
WBC: 12.6 10*3/uL — AB (ref 4.0–10.5)

## 2017-01-24 MED ORDER — WARFARIN SODIUM 3 MG PO TABS
3.0000 mg | ORAL_TABLET | Freq: Once | ORAL | Status: AC
  Administered 2017-01-24: 18:00:00 3 mg via ORAL
  Filled 2017-01-24: qty 1

## 2017-01-24 NOTE — Evaluation (Signed)
Physical Therapy Evaluation Patient Details Name: [name] MRN: [id number] DOB: 23-Sep-1940 Today's Date: 01/24/2017   History of Present Illness  This is a pleasant 76 yo M with systolic CHF, EF now 46%, CKD III baseline Cr 1.8, hx DVT/PE on warfarin, HTN and Addison's disease who presents with foot cellulitis and hypotension, history of falls. open sore on plantar surface  Clinical Impression  The patient has noted edema and an open wound draining on left forefoot, plantar surface. Currently would limit weight bearing until wound addressed. May benefit from an offloading shoe but that will most likely cause in increased risk for falls. May consider a post op shoe for protection.  Pt admitted with above diagnosis. Pt currently with functional limitations due to the deficits listed below (see PT Problem List).Pt will benefit from skilled PT to increase their independence and safety with mobility to allow discharge to the venue listed below.     Follow Up Recommendations SNF- per notes, falls PTA    Equipment Recommendations  Rolling walker with 5" wheels    Recommendations for Other Services       Precautions / Restrictions Precautions Precautions: Fall Precaution Comments: limit ambulation on left forefoot until cleared  by MD- ? benefit from [place]co Restrictions Weight Bearing Restrictions: No      Mobility  Bed Mobility Overal bed mobility: Independent                Transfers Overall transfer level: Needs assistance Equipment used: Rolling walker (2 wheeled) Transfers: Sit to/from Stand Sit to Stand: Min guard            Ambulation/Gait Ambulation/Gait assistance: Min assist Ambulation Distance (Feet): 45 Feet Assistive device: Rolling walker (2 wheeled) Gait Pattern/deviations: Antalgic     General Gait Details: noted drainage from left plantar forefoot wound  Stairs            Wheelchair Mobility    Modified Rankin (Stroke Patients  Only)       Balance Overall balance assessment: History of Falls;Needs assistance Sitting-balance support: No upper extremity supported;Feet supported Sitting balance-Leahy Scale: Good     Standing balance support: No upper extremity supported;During functional activity Standing balance-Leahy Scale: Poor                               Pertinent Vitals/Pain Pain Assessment: 0-10 Pain Score: 8  Pain Location: left foor with weight Pain Descriptors / Indicators: Discomfort Pain Intervention(s): Monitored during session;Limited activity within patient's tolerance    Home Living Family/patient expects to be discharged to:: Private residence Living Arrangements: Spouse/significant other   Type of Home: House Home Access: Level entry     Home Layout: One level Home Equipment: None      Prior Function Level of Independence: Independent               Hand Dominance        Extremity/Trunk Assessment   Upper Extremity Assessment Upper Extremity Assessment: Generalized weakness    Lower Extremity Assessment Lower Extremity Assessment: Generalized weakness       Communication   Communication: No difficulties  Cognition Arousal/Alertness: Awake/alert Behavior During Therapy: WFL for tasks assessed/performed;Impulsive Overall Cognitive Status: Within Functional Limits for tasks assessed  General Comments      Exercises     Assessment/Plan    PT Assessment Patient needs continued PT services  PT Problem List Decreased strength;Decreased knowledge of use of DME;Decreased activity tolerance;Decreased balance;Decreased knowledge of precautions;Decreased mobility;Pain       PT Treatment Interventions DME instruction;Gait training;Functional mobility training;Therapeutic activities;Patient/family education    PT Goals (Current goals can be found in the Care Plan section)  Acute Rehab PT  Goals Patient Stated Goal: agreed with walking PT Goal Formulation: With patient Time For Goal Achievement: 02/07/17 Potential to Achieve Goals: Fair    Frequency Min 3X/week   Barriers to discharge        Co-evaluation               AM-PAC PT "6 Clicks" Daily Activity  Outcome Measure Difficulty turning over in bed (including adjusting bedclothes, sheets and blankets)?: None Difficulty moving from lying on back to sitting on the side of the bed? : None Difficulty sitting down on and standing up from a chair with arms (e.g., wheelchair, bedside commode, etc,.)?: A Little Help needed moving to and from a bed to chair (including a wheelchair)?: A Little Help needed walking in hospital room?: A Little Help needed climbing 3-5 steps with a railing? : A Lot 6 Click Score: 19    End of Session Equipment Utilized During Treatment: Gait belt Activity Tolerance: Patient limited by pain Patient left: in bed;with call bell/phone within reach;with nursing/sitter in room Nurse Communication: Mobility status PT Visit Diagnosis: Difficulty in walking, not elsewhere classified (R26.2);Unsteadiness on feet (R26.81);History of falling (Z91.81)    Time: 1020-1035 PT Time Calculation (min) (ACUTE ONLY): 15 min   Charges:   PT Evaluation $PT Eval Low Complexity: 1 Low     PT G CodesTresa Endo PT [phone]   Claretha Cooper 01/24/2017, 10:48 AM

## 2017-01-24 NOTE — Progress Notes (Addendum)
PROGRESS NOTE    [name]  YHC:[id number] DOB: 1941/01/04 DOA: 01/21/2017 PCP: Lawerance Cruel, MD   Brief Narrative: Patient is a 76 year old male with past medical history of systolic CHF, CKD stage III with baseline creatinine of 1.8, history of DVT/PE on warfarin, hypertension, Addison's disease who presents with for cellulitis of the left lower extremity and hypertension.  Assessment & Plan:   Principal Problem:   Cellulitis of foot Active Problems:   Hypothyroidism   CKD (chronic kidney disease) stage 3, GFR 30-59 ml/min (HCC)   Chronic respiratory failure (HCC)   Coronary artery disease involving native coronary artery of native heart without angina pectoris   Hypotension  Cellulitis of the left foot/sepsis: Presented with mildly elevated lactate and hypotension.  White cell count is improving.  Left lower extremity cellulitis improving.  Ultrasound is negative for DVT. On Ancef.  Continue extremity elevation.  Will follow blood culture report  Hypotension/Addison's disease: Currently blood pressure stable.  On Cortef at home.  Started on Solu-Cortef here.  Losartan and Lasix on hold.  Imdur restarted  History of coronary artery disease/systolic-diastolic CHF: Previous ejection fraction of 30%.  No ejection fraction found to be 55%.  Continue beta-blockers.  Losartan/ Lasix on hold due to  hypotension.  History of DVT/PE: Continue warfarin.  Continue to monitor daily INR.  Hypothyroidism: Continue Armour Thyroid  Chronic respiratory failure with hypoxia: Requires intermittent oxygen as he has episodes of hypoxia.  Was on oxygen at home in the past but he does not use.  Likely for CHF.  CKD stage III: Currently kidney function is around baseline.  Anemia of chronic disease:Currently stable  Debility/weakness:Physical therapy recommended skilled nursing facility on discharge.  Social worker consulted      DVT prophylaxis:Warfarin Code Status:  Full Family Communication: None  Disposition Plan: DC planning tomorrow to a skilled nursing facility with oral antibiotics   Consultants: None  Procedures: US duplex  Antimicrobials: Cefazolin  Subjective: Patient seen and examined the bedside this afternoon.  Feels comfortable.  Left lower extremity improving.    Objective: Vitals:   01/23/17 1416 01/23/17 2023 01/24/17 0453 01/24/17 1347  BP: 136/70 136/71 126/74 123/71  Pulse: 81 77 83 81  Resp: 16 20 20 18   Temp: 98 F (36.7 C) 98.3 F (36.8 C) 99 F (37.2 C) 99.1 F (37.3 C)  TempSrc: Oral Oral Oral Oral  SpO2: 97% 95% 95% 94%  Weight:      Height:        Intake/Output Summary (Last 24 hours) at 01/24/2017 1632 Last data filed at 01/24/2017 1325 Gross per 24 hour  Intake 243 ml  Output 1575 ml  Net -1332 ml   Filed Weights   01/21/17 1322  Weight: 108 kg (238 lb)    Examination:  General exam: Appears calm and comfortable ,Not in distress,average built Respiratory system: Bilateral equal air entry, normal vesicular breath sounds, no wheezes or crackles  Cardiovascular system: S1 & S2 heard, RRR. No JVD, murmurs, rubs, gallops or clicks. No pedal edema. Gastrointestinal system: Abdomen is nondistended, soft and nontender. No organomegaly or masses felt. Normal bowel sounds heard. Central nervous system: Alert and oriented. No focal neurological deficits. Extremities: No clubbing ,no cyanosis, distal peripheral pulses palpable. Mild erythema, edema of the left lower extremity mainly left foot Skin: No rashes, lesions or ulcers,no icterus ,no pallor Psychiatry: Judgement and insight appear normal. Mood & affect appropriate.     Data Reviewed: I have personally reviewed following  labs and imaging studies  CBC: Recent Labs  Lab 01/21/17 1325 01/22/17 0530 01/23/17 0539 01/24/17 0542  WBC 21.3* 17.6* 17.4* 12.6*  NEUTROABS 16.8*  --   --   --   HGB 11.2* 11.0* 10.5* 10.6*  HCT 34.4* 34.7* 32.4*  33.6*  MCV 81.7 82.8 82.4 83.2  PLT 370 382 379 734   Basic Metabolic Panel: Recent Labs  Lab 01/21/17 1325 01/22/17 0530 01/23/17 0539 01/24/17 0542  NA 134* 137 138 138  K 3.4* 3.6 3.4* 3.7  CL 104 104 107 107  CO2 22 22 22 22   GLUCOSE 212* 185* 190* 162*  BUN 21* 19 25* 23*  CREATININE 1.73* 1.56* 1.51* 1.32*  CALCIUM 7.9* 8.0* 8.0* 8.2*   GFR: Estimated Creatinine Clearance: 58.6 mL/min (A) (by C-G formula based on SCr of 1.32 mg/dL (H)). Liver Function Tests: Recent Labs  Lab 01/21/17 1325  AST 31  ALT 36  ALKPHOS 81  BILITOT 1.8*  PROT 6.4*  ALBUMIN 2.8*   No results for input(s): LIPASE, AMYLASE in the last 168 hours. No results for input(s): AMMONIA in the last 168 hours. Coagulation Profile: Recent Labs  Lab 01/19/17 0937 01/21/17 1325 01/22/17 1009 01/23/17 0539 01/24/17 0542  INR 1.7 1.88 1.94 2.29 2.60   Cardiac Enzymes: No results for input(s): CKTOTAL, CKMB, CKMBINDEX, TROPONINI in the last 168 hours. BNP (last 3 results) Recent Labs    05/19/16 1500  PROBNP 40   HbA1C: No results for input(s): HGBA1C in the last 72 hours. CBG: Recent Labs  Lab 01/22/17 1251 01/22/17 1701 01/22/17 2139 01/23/17 0722 01/23/17 1214  GLUCAP 248* 166* 139* 172* 198*   Lipid Profile: No results for input(s): CHOL, HDL, LDLCALC, TRIG, CHOLHDL, LDLDIRECT in the last 72 hours. Thyroid Function Tests: No results for input(s): TSH, T4TOTAL, FREET4, T3FREE, THYROIDAB in the last 72 hours. Anemia Panel: No results for input(s): VITAMINB12, FOLATE, FERRITIN, TIBC, IRON, RETICCTPCT in the last 72 hours. Sepsis Labs: Recent Labs  Lab 01/21/17 1350 01/21/17 1632  LATICACIDVEN 2.05* 2.09*    Recent Results (from the past 240 hour(s))  Blood Culture (routine x 2)     Status: None (Preliminary result)   Collection Time: 01/21/17 12:43 PM  Result Value Ref Range Status   Specimen Description BLOOD RIGHT ANTECUBITAL  Final   Special Requests   Final     BOTTLES DRAWN AEROBIC AND ANAEROBIC Blood Culture adequate volume   Culture   Final    NO GROWTH 3 DAYS Performed at [facility], [place] [address] [place], [place] [address]    Report Status PENDING  Incomplete  Blood Culture (routine x 2)     Status: None (Preliminary result)   Collection Time: 01/21/17  1:51 PM  Result Value Ref Range Status   Specimen Description BLOOD RIGHT HAND  Final   Special Requests   Final    BOTTLES DRAWN AEROBIC AND ANAEROBIC Blood Culture adequate volume   Culture   Final    NO GROWTH 3 DAYS Performed at [facility], [place] [address], [place], [place] [address]    Report Status PENDING  Incomplete         Radiology Studies: No results found.      Scheduled Meds: . carvedilol  6.25 mg Oral BID WC  . ferrous sulfate  325 mg Oral Q breakfast  . gabapentin  600 mg Oral Daily  . hydrocortisone sod succinate (SOLU-CORTEF) inj  50 mg Intravenous Q12H  .  isosorbide mononitrate  15 mg Oral Daily  . sodium chloride flush  3 mL Intravenous Q12H  . thyroid  60 mg Oral QAC breakfast  . warfarin  3 mg Oral ONCE-1800  . Warfarin - Pharmacist Dosing Inpatient   Does not apply q1800   Continuous Infusions: . sodium chloride    .  ceFAZolin (ANCEF) IV Stopped (01/24/17 1439)     LOS: 3 days    Time spent: 25 minutes    Donoven Pett Jodie Echevaria, MD [facility] Pager [phone]  If 7PM-7AM, please contact night-coverage [email or web address] Password [email or web address] 01/24/2017, 4:32 PM

## 2017-01-24 NOTE — Progress Notes (Signed)
ANTICOAGULATION CONSULT NOTE - Follow-Up Consult  Pharmacy Consult for coumadin Indication: hx DVT/PE on coumadin PTA  No Known Allergies  Patient Measurements: Height: 5\' 10"  (177.8 cm) Weight: 238 lb (108 kg) IBW/kg (Calculated) : 73   Vital Signs: Temp: 99 F (37.2 C) (12/31 0453) Temp Source: Oral (12/31 0453) BP: 126/74 (12/31 0453) Pulse Rate: 83 (12/31 0453)  Labs: Recent Labs    01/22/17 0530 01/22/17 1009 01/23/17 0539 01/24/17 0542  HGB 11.0*  --  10.5* 10.6*  HCT 34.7*  --  32.4* 33.6*  PLT 382  --  379 386  LABPROT  --  22.0* 25.0* 27.6*  INR  --  1.94 2.29 2.60  CREATININE 1.56*  --  1.51* 1.32*    Estimated Creatinine Clearance: 58.6 mL/min (A) (by C-G formula based on SCr of 1.32 mg/dL (H)).   Medical History: Past Medical History:  Diagnosis Date  . Acute lower GI bleeding    a. admitted to [facility] 04/11/2013;  b. 04/2014 EGD: 1.  gastric erosions, mild prox gastritis and bulbar duodenitis->Protonix.  . Addison's disease ([place])   . Anemia   . Anemia in CKD (chronic kidney disease) 06/25/2015  . Chronic back pain   . Chronic diastolic CHF (congestive heart failure) ([place])    a. His initial ejection fraction was between 30 and 35%;  b. 04/2013 Echo: EF 50-55% Gr1 DD, mild-mod MR;  c. 04/2014 Echo: EF 60-65%, Gr 1 DD, triv TR.  . CKD (chronic kidney disease), stage III ([place])   . Coronary artery disease    a. 07/2014 - s/p difficult PCI - had pressure wire analysis of LAD s/p DES in mid segment c/b wire-induced dissection in the distal segment of the LAD which was treated with repeated balloon inflations. Also had DES to rPDA - again had either spasm distal to the stent placement or an edge dissection but the vessel was small in that area; procedure aborted.  . Daily headache   . DVT (deep venous thrombosis) ([place])    a. 02/2013 superficial thrombophlebitis --> Xarelto later d/c'd 2/2 GIB;  b. 04/2014 Acute Right PT, Peroneal, Popliteal, Femoral, and common femoral  DVT-->coumadin  . GERD (gastroesophageal reflux disease)   . H/O hiatal hernia   . Hepatitis    a. 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  . History of aortic insufficiency   . Hx of cardiovascular stress test    a. ETT-Myoview 7/14:  Low risk, inferior defect consistent with thinning, no ischemia, normal wall motion, EF 54%  . Hypertension   . Hypogonadism male   . Obesity   . On home oxygen therapy    a. added 04/2014 in setting of PE.  Marland Kitchen Peptic ulcer disease   . Pulmonary embolism ([place])    a. 04/2014 CTA: Bilat submassive PE ->coumadin.  . Pulmonary HTN ([place])   . Thyroid disease     Medications:  Medications Prior to Admission  Medication Sig Dispense Refill Last Dose  . CALCIUM PO Take by mouth.   01/20/2017 at Unknown time  . carvedilol (COREG) 6.25 MG tablet TAKE 1 TABLET TWICE A DAY WITH MEALS 180 tablet 3 01/17/2017  . Cholecalciferol (VITAMIN D3 PO) Take 1,000 capsules by mouth daily.    01/20/2017 at Unknown time  . ferrous sulfate 325 (65 FE) MG tablet Take 1 tablet (325 mg total) by mouth daily with breakfast. 30 tablet 0 01/21/2017 at Unknown time  . furosemide (LASIX) 40 MG tablet Take 1 tablet (40 mg total)  by mouth 2 (two) times daily. 90 tablet 1 01/21/2017 at Unknown time  . gabapentin (NEURONTIN) 600 MG tablet Take 600 mg by mouth daily.   01/20/2017 at Unknown time  . hydrocortisone (CORTEF) 20 MG tablet Take 20 mg by mouth. Taking three times daily 20mg /10mg /20mg    01/21/2017 at Unknown time  . ibuprofen (ADVIL,MOTRIN) 200 MG tablet Take 400 mg by mouth at bedtime as needed (PAIN).   01/20/2017 at Unknown time  . isosorbide mononitrate (IMDUR) 30 MG 24 hr tablet Take 0.5 tablets (15 mg total) by mouth daily.   01/21/2017 at Unknown time  . lisinopril (PRINIVIL,ZESTRIL) 10 MG tablet Take 0.5 tablets (5 mg total) by mouth daily. 45 tablet 1 01/21/2017 at Unknown time  . losartan (COZAAR) 25 MG tablet Take 25 mg by mouth daily.   01/21/2017 at Unknown time  . MAGNESIUM PO Take by mouth.    01/20/2017 at Unknown time  . metoCLOPramide (REGLAN) 5 MG tablet Take 5 mg by mouth daily.    01/21/2017 at Unknown time  . Multiple Vitamins-Minerals (ZINC PO) Take by mouth.   01/20/2017 at Unknown time  . nitroGLYCERIN (NITROSTAT) 0.4 MG SL tablet PLACE 1 TABLET UNDER TONGUE EVERY 5 MINUTES AS NEEDED FOR CHEST PAIN (UP TO 3 DOSES) 75 tablet 1 on hand  . pantoprazole (PROTONIX) 40 MG tablet Take 40 mg by mouth daily.   01/21/2017 at Unknown time  . potassium chloride SA (K-DUR,KLOR-CON) 20 MEQ tablet Take 20 mEq by mouth daily.   01/21/2017 at Unknown time  . PROAIR HFA 108 (90 Base) MCG/ACT inhaler INHALE 2 PUFFS INTO THE LUNGS EVERY 6 (SIX) HOURS AS NEEDED FOR WHEEZING OR SHORTNESS OF BREATH. 8.5 Inhaler 5 Past Month at Unknown time  . promethazine (PHENERGAN) 25 MG tablet Take 25 mg by mouth every 6 (six) hours as needed for nausea.    01/21/2017 at Unknown time  . thyroid (ARMOUR) 60 MG tablet Take 60 mg by mouth daily.    01/21/2017 at Unknown time  . traMADol (ULTRAM) 50 MG tablet Take 100 mg by mouth 3 (three) times daily.    01/21/2017 at Unknown time  . traZODone (DESYREL) 100 MG tablet Take 100 mg by mouth every evening.   2 01/20/2017 at Unknown time  . VITAMIN E PO Take by mouth.   01/20/2017 at Unknown time  . warfarin (COUMADIN) 3 MG tablet TAKE AS DIRECTED BY COOUMADIN CLINIC (Patient taking differently: TAKE 1.5 MGS ON THURSDAYS AND SUNDAYS AS DIRECTED BY COOUMADIN CLINIC) 7 tablet 0 01/20/2017 at 600 PM  . warfarin (COUMADIN) 3 MG tablet TAKE AS DIRECTED BY COOUMADIN CLINIC (Patient taking differently: TAKE 3 MGS ON MONDAYS, TUESDAYS, WEDNESDAYS, FRIDAYS AND SATURDAYS AS DIRECTED BY COOUMADIN CLINIC) 30 tablet 0 01/19/2017 at Unknown time  . buPROPion (WELLBUTRIN XL) 150 MG 24 hr tablet Take 1 tablet (150 mg total) by mouth daily. (Patient not taking: Reported on 01/21/2017) 30 tablet 6 Not Taking at Unknown time  . escitalopram (LEXAPRO) 10 MG tablet Take 1 tablet (10 mg total) by  mouth daily. (Patient not taking: Reported on 01/21/2017) 30 tablet 11 Not Taking at Unknown time  . folic acid (FOLVITE) 1 MG tablet Take 1 tablet (1 mg total) by mouth daily. (Patient not taking: Reported on 01/21/2017) 30 tablet 0 Not Taking at Unknown time    Assessment: 76 yo M on coumadin PTA for hx PE/DVT.  Coumadin clinic pt= last clinic visit 12/26 - take 4.5 mg today then resume  3 mg daily except 1.5 mg on Sundays and Thursdays.  Last dose 12/27 at 6 pm.  Admission INR below goal at 1.88.    Today, 01/24/17   INR therapeutic at 2.6  Hgb low but stable. Plts WNL.  No bleeding issues noted   Goal of Therapy:  INR 2-3 Monitor platelets by anticoagulation protocol: Yes   Plan:   Coumadin 3 mg po x 1 dose tonight  Daily INR  Monitor for signs and symptoms of bleeding  Dolly Rias RPh 01/24/2017, 8:45 AM Pager [phone]

## 2017-01-25 LAB — CBC
HCT: 33.1 % — ABNORMAL LOW (ref 39.0–52.0)
Hemoglobin: 10.5 g/dL — ABNORMAL LOW (ref 13.0–17.0)
MCH: 26.2 pg (ref 26.0–34.0)
MCHC: 31.7 g/dL (ref 30.0–36.0)
MCV: 82.5 fL (ref 78.0–100.0)
Platelets: 405 10*3/uL — ABNORMAL HIGH (ref 150–400)
RBC: 4.01 MIL/uL — AB (ref 4.22–5.81)
RDW: 16.9 % — ABNORMAL HIGH (ref 11.5–15.5)
WBC: 13.8 10*3/uL — ABNORMAL HIGH (ref 4.0–10.5)

## 2017-01-25 LAB — BASIC METABOLIC PANEL
ANION GAP: 7 (ref 5–15)
BUN: 26 mg/dL — ABNORMAL HIGH (ref 6–20)
CO2: 23 mmol/L (ref 22–32)
Calcium: 8.2 mg/dL — ABNORMAL LOW (ref 8.9–10.3)
Chloride: 109 mmol/L (ref 101–111)
Creatinine, Ser: 1.31 mg/dL — ABNORMAL HIGH (ref 0.61–1.24)
GFR calc non Af Amer: 51 mL/min — ABNORMAL LOW (ref >60)
GFR, EST AFRICAN AMERICAN: 59 mL/min — AB (ref >60)
Glucose, Bld: 144 mg/dL — ABNORMAL HIGH (ref 65–99)
Potassium: 3.7 mmol/L (ref 3.5–5.1)
Sodium: 139 mmol/L (ref 135–145)

## 2017-01-25 LAB — PROTIME-INR
INR: 2.55
PROTHROMBIN TIME: 27.3 s — AB (ref 11.4–15.2)

## 2017-01-25 MED ORDER — WARFARIN SODIUM 3 MG PO TABS
3.0000 mg | ORAL_TABLET | Freq: Once | ORAL | Status: DC
  Filled 2017-01-25: qty 1

## 2017-01-25 MED ORDER — CEPHALEXIN 500 MG PO CAPS: 500.0000 mg | ORAL_CAPSULE | Freq: Four times a day (QID) | ORAL | Status: DC

## 2017-01-25 MED ORDER — HYDROCORTISONE 20 MG PO TABS: 20.0000 mg | ORAL_TABLET | Freq: Three times a day (TID) | ORAL | Status: DC

## 2017-01-25 MED ORDER — CEPHALEXIN 500 MG PO CAPS: 500.0000 mg | ORAL_CAPSULE | Freq: Four times a day (QID) | ORAL | 0 refills | Status: AC

## 2017-01-25 MED ORDER — HYDROCORTISONE 20 MG PO TABS
20.0000 mg | ORAL_TABLET | Freq: Two times a day (BID) | ORAL | Status: DC
  Filled 2017-01-25 (×2): qty 1

## 2017-01-25 MED ORDER — HYDROCORTISONE 10 MG PO TABS
10.0000 mg | ORAL_TABLET | Freq: Every day | ORAL | Status: DC
  Administered 2017-01-25: 10 mg via ORAL
  Filled 2017-01-25: qty 1

## 2017-01-25 NOTE — Progress Notes (Signed)
ANTICOAGULATION CONSULT NOTE - Follow-Up Consult  Pharmacy Consult for coumadin Indication: hx DVT/PE on coumadin PTA  No Known Allergies  Patient Measurements: Height: 5\' 10"  (177.8 cm) Weight: 238 lb (108 kg) IBW/kg (Calculated) : 73   Vital Signs: Temp: 98.2 F (36.8 C) (01/01 0528) Temp Source: Oral (01/01 0528) BP: 122/64 (01/01 0745) Pulse Rate: 85 (01/01 0745)  Labs: Recent Labs    01/23/17 0539 01/24/17 0542 01/25/17 0603  HGB 10.5* 10.6* 10.5*  HCT 32.4* 33.6* 33.1*  PLT 379 386 405*  LABPROT 25.0* 27.6* 27.3*  INR 2.29 2.60 2.55  CREATININE 1.51* 1.32* 1.31*    Estimated Creatinine Clearance: 59 mL/min (A) (by C-G formula based on SCr of 1.31 mg/dL (H)).   Medical History: Past Medical History:  Diagnosis Date  . Acute lower GI bleeding    a. admitted to [facility] 04/11/2013;  b. 04/2014 EGD: 1.  gastric erosions, mild prox gastritis and bulbar duodenitis->Protonix.  . Addison's disease ([place])   . Anemia   . Anemia in CKD (chronic kidney disease) 06/25/2015  . Chronic back pain   . Chronic diastolic CHF (congestive heart failure) ([place])    a. His initial ejection fraction was between 30 and 35%;  b. 04/2013 Echo: EF 50-55% Gr1 DD, mild-mod MR;  c. 04/2014 Echo: EF 60-65%, Gr 1 DD, triv TR.  . CKD (chronic kidney disease), stage III ([place])   . Coronary artery disease    a. 07/2014 - s/p difficult PCI - had pressure wire analysis of LAD s/p DES in mid segment c/b wire-induced dissection in the distal segment of the LAD which was treated with repeated balloon inflations. Also had DES to rPDA - again had either spasm distal to the stent placement or an edge dissection but the vessel was small in that area; procedure aborted.  . Daily headache   . DVT (deep venous thrombosis) ([place])    a. 02/2013 superficial thrombophlebitis --> Xarelto later d/c'd 2/2 GIB;  b. 04/2014 Acute Right PT, Peroneal, Popliteal, Femoral, and common femoral DVT-->coumadin  . GERD (gastroesophageal  reflux disease)   . H/O hiatal hernia   . Hepatitis    a. 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  . History of aortic insufficiency   . Hx of cardiovascular stress test    a. ETT-Myoview 7/14:  Low risk, inferior defect consistent with thinning, no ischemia, normal wall motion, EF 54%  . Hypertension   . Hypogonadism male   . Obesity   . On home oxygen therapy    a. added 04/2014 in setting of PE.  Marland Kitchen Peptic ulcer disease   . Pulmonary embolism ([place])    a. 04/2014 CTA: Bilat submassive PE ->coumadin.  . Pulmonary HTN ([place])   . Thyroid disease     Medications:  Medications Prior to Admission  Medication Sig Dispense Refill Last Dose  . CALCIUM PO Take by mouth.   01/20/2017 at Unknown time  . carvedilol (COREG) 6.25 MG tablet TAKE 1 TABLET TWICE A DAY WITH MEALS 180 tablet 3 01/17/2017  . Cholecalciferol (VITAMIN D3 PO) Take 1,000 capsules by mouth daily.    01/20/2017 at Unknown time  . ferrous sulfate 325 (65 FE) MG tablet Take 1 tablet (325 mg total) by mouth daily with breakfast. 30 tablet 0 01/21/2017 at Unknown time  . furosemide (LASIX) 40 MG tablet Take 1 tablet (40 mg total) by mouth 2 (two) times daily. 90 tablet 1 01/21/2017 at Unknown time  . gabapentin (NEURONTIN) 600 MG tablet  Take 600 mg by mouth daily.   01/20/2017 at Unknown time  . hydrocortisone (CORTEF) 20 MG tablet Take 20 mg by mouth. Taking three times daily 20mg /10mg /20mg    01/21/2017 at Unknown time  . ibuprofen (ADVIL,MOTRIN) 200 MG tablet Take 400 mg by mouth at bedtime as needed (PAIN).   01/20/2017 at Unknown time  . isosorbide mononitrate (IMDUR) 30 MG 24 hr tablet Take 0.5 tablets (15 mg total) by mouth daily.   01/21/2017 at Unknown time  . lisinopril (PRINIVIL,ZESTRIL) 10 MG tablet Take 0.5 tablets (5 mg total) by mouth daily. 45 tablet 1 01/21/2017 at Unknown time  . losartan (COZAAR) 25 MG tablet Take 25 mg by mouth daily.   01/21/2017 at Unknown time  . MAGNESIUM PO Take by mouth.   01/20/2017 at Unknown time  .  metoCLOPramide (REGLAN) 5 MG tablet Take 5 mg by mouth daily.    01/21/2017 at Unknown time  . Multiple Vitamins-Minerals (ZINC PO) Take by mouth.   01/20/2017 at Unknown time  . nitroGLYCERIN (NITROSTAT) 0.4 MG SL tablet PLACE 1 TABLET UNDER TONGUE EVERY 5 MINUTES AS NEEDED FOR CHEST PAIN (UP TO 3 DOSES) 75 tablet 1 on hand  . pantoprazole (PROTONIX) 40 MG tablet Take 40 mg by mouth daily.   01/21/2017 at Unknown time  . potassium chloride SA (K-DUR,KLOR-CON) 20 MEQ tablet Take 20 mEq by mouth daily.   01/21/2017 at Unknown time  . PROAIR HFA 108 (90 Base) MCG/ACT inhaler INHALE 2 PUFFS INTO THE LUNGS EVERY 6 (SIX) HOURS AS NEEDED FOR WHEEZING OR SHORTNESS OF BREATH. 8.5 Inhaler 5 Past Month at Unknown time  . promethazine (PHENERGAN) 25 MG tablet Take 25 mg by mouth every 6 (six) hours as needed for nausea.    01/21/2017 at Unknown time  . thyroid (ARMOUR) 60 MG tablet Take 60 mg by mouth daily.    01/21/2017 at Unknown time  . traMADol (ULTRAM) 50 MG tablet Take 100 mg by mouth 3 (three) times daily.    01/21/2017 at Unknown time  . traZODone (DESYREL) 100 MG tablet Take 100 mg by mouth every evening.   2 01/20/2017 at Unknown time  . VITAMIN E PO Take by mouth.   01/20/2017 at Unknown time  . warfarin (COUMADIN) 3 MG tablet TAKE AS DIRECTED BY COOUMADIN CLINIC (Patient taking differently: TAKE 1.5 MGS ON THURSDAYS AND SUNDAYS AS DIRECTED BY COOUMADIN CLINIC) 7 tablet 0 01/20/2017 at 600 PM  . warfarin (COUMADIN) 3 MG tablet TAKE AS DIRECTED BY COOUMADIN CLINIC (Patient taking differently: TAKE 3 MGS ON MONDAYS, TUESDAYS, WEDNESDAYS, FRIDAYS AND SATURDAYS AS DIRECTED BY COOUMADIN CLINIC) 30 tablet 0 01/19/2017 at Unknown time  . buPROPion (WELLBUTRIN XL) 150 MG 24 hr tablet Take 1 tablet (150 mg total) by mouth daily. (Patient not taking: Reported on 01/21/2017) 30 tablet 6 Not Taking at Unknown time  . escitalopram (LEXAPRO) 10 MG tablet Take 1 tablet (10 mg total) by mouth daily. (Patient not  taking: Reported on 01/21/2017) 30 tablet 11 Not Taking at Unknown time  . folic acid (FOLVITE) 1 MG tablet Take 1 tablet (1 mg total) by mouth daily. (Patient not taking: Reported on 01/21/2017) 30 tablet 0 Not Taking at Unknown time    Assessment: 77 yo M on coumadin PTA for hx PE/DVT.  Coumadin clinic pt= last clinic visit 12/26 - take 4.5 mg today then resume 3 mg daily except 1.5 mg on Sundays and Thursdays.  Last dose 12/27 at 6 pm.  Admission INR  below goal at 1.88.    Today, 01/25/17   INR therapeutic at 2.55  Hgb low but stable. Plts WNL.  No bleeding issues noted   Goal of Therapy:  INR 2-3 Monitor platelets by anticoagulation protocol: Yes   Plan:   Coumadin 3 mg po x 1 dose tonight  Daily INR  Monitor for signs and symptoms of bleeding  Dolly Rias RPh 01/25/2017, 8:00 AM Pager [phone]

## 2017-01-25 NOTE — Care Management Note (Signed)
Case Management Note  Patient Details  Name: [name] MRN: [id number] Date of Birth: Nov 01, 1940  Subjective/Objective:                  Cellulitis   Action/Plan: CM spoke with the patient at the bedside. Attempted to explain the [facility] choice. Patient agrees for CM to call his wife to discuss choice. CM spoke with [name]. She selected [facility] for home health services. [name] at [facility] notified of the referral and is able to accept the patient. Patient has a RW and a cane at home. Wife reports he will use the cane to assist with getting into the car to get to his physician appointments. She denies any difficulty affording his medications.   Expected Discharge Date:  01/25/17               Expected Discharge Plan:  Onset  In-House Referral:     Discharge planning Services  CM Consult  Post Acute Care Choice:  Home Health Choice offered to:  Spouse, Patient  DME Arranged:  N/A DME Agency:  NA  HH Arranged:  PT [place] Agency:  [place] (now [facility])  Status of Service:  Completed, signed off  If discussed at [employer] of Stay Meetings, dates discussed:    Additional Comments:  Dennis Schneiders, RN 01/25/2017, 12:07 PM

## 2017-01-25 NOTE — Clinical Social Work Note (Signed)
Clinical Social Work Assessment  Patient Details  Name: [name] MRN: [id number] Date of Birth: Aug 19, 1940  Date of referral:  01/25/17               Reason for consult:  Facility Placement                Permission sought to share information with:  Family Supports Permission granted to share information::     Name::        Agency::     Relationship::  Spouse  Contact Information:     Housing/Transportation Living arrangements for the past 2 months:  Single Family Home Source of Information:  Spouse Patient Interpreter Needed:  None Criminal Activity/Legal Involvement Pertinent to Current Situation/Hospitalization:  No - Comment as needed Significant Relationships:  Spouse Lives with:  Self Do you feel safe going back to the place where you live?  Yes Need for family participation in patient care:  Yes ([place] with mobility)  Care giving concerns:  Patient who presents for a generalized weakness, per syncope feeling ligh headed when he stands up. He has redness and swelling of the left foot and lower leg.    Social Worker assessment / plan:  CSW spoke with patient spouse on phone about patient discharge and recommendation for SNF. Patient spouse reports the patient has had falls at home. He has a medical history of neuropathy which makes it difficult for him to walk. She reports the patient stays in the bed most of the day. CSW explain SNF process and PT recommendation for short rehab. Patient spouse agreeable for CSW to fax patient out to the SNF in the area.  CSW will follow up with bed offer.   Plan: Assist with discharge to SNF.   Employment status:  Retired Forensic scientist:  Medicare PT Recommendations:  [place] / Referral to community resources:  [place]  Patient/Family's Response to care:  Agreeable for CSW to faxout clinical information.   Patient/Family's Understanding of and Emotional Response to  Diagnosis, Current Treatment, and Prognosis:  Patient is forgetful, Patient spouse is knowledgeable of patient diagnoisis and current level of treatment.   Emotional Assessment Appearance:  Appears stated age Attitude/Demeanor/Rapport:    Affect (typically observed):    Orientation:  Oriented to Self, Oriented to Place, Oriented to  Time, Oriented to Situation Alcohol / Substance use:  Not Applicable Psych involvement (Current and /or in the community):     Discharge Needs  Concerns to be addressed:  Discharge Planning Concerns Readmission within the last 30 days:  No Current discharge risk:  Dependent with Mobility Barriers to Discharge:  Requiring sitter/restraints, Continued Medical Work up   [employer], LCSW 01/25/2017, 8:52 AM

## 2017-01-25 NOTE — Discharge Summary (Signed)
Physician Discharge Summary  [name] UDJ:[id number] DOB: [birth year] DOA: 01/21/2017  PCP: Lawerance Cruel, MD  Admit date: 01/21/2017 Discharge date: 01/25/2017  Admitted From: Home Disposition:  Home  Recommendations for Outpatient Follow-up:  1. Follow up with PCP in 1-2 weeks 2. Please obtain BMP/CBC in one week   Home Health:Yes Equipment/Devices:None  Discharge Condition: Stable CODE STATUS: Full Diet recommendation: Heart Healthy   Brief/Interim Summary: Patient is a 77 year old male with past medical history of systolic CHF, CKD stage III with baseline creatinine of 1.8, history of DVT/PE on warfarin, hypertension, Addison's disease who presents with for cellulitis of the left lower extremity and hypotension. Patient was suspected to have sepsis secondary to cellulitis of the left lower extremity and was admitted.  Started on broad-spectrum IV antibiotics.  He has history of Addison's disease and was on corticosteroids at home.  IV hydrocortisone was started here. Patient overall condition gradually improved.  He still remained significantly weak to ambulate independently.  Physical therapy was consulted.  Physical therapy recommended skilled nursing facility on discharge but patient's wife and patient himself denied that.  Patient will be discharged home with home health.  He will continue to oral antibiotics at home to finish the antibiotics course. Patient has been advised to follow-up with his PCP in a week.  He has been advised to do CBC/BMP testing a week.  Following problems were addressed during his hospitalization:  Cellulitis of the left foot/sepsis: Presented with mildly elevated lactate and hypotension.  White cell count  improved.  Left lower extremity cellulitis improving.  Ultrasound is negative for DVT. On Ancef.  Blood culture no growth to date.  Discharged on Keflex for 3 more days.  Hypotension/Addison's disease: Currently blood pressure  stable.  On Cortef at home.  Started on Solu-Cortef here.    We will resume his home meds on discharge.  History of coronary artery disease/systolic-diastolic CHF: Previous ejection fraction of 30%.  Now ejection fraction found to be 55%.  Continue beta-blockers.    He was on Lasix 40 mg twice a day at home and he presented with acute kidney injury.  Patient has been recommended to take only 20 mg of Lasix at home.  He has been advised to follow-up with cardiology as an outpatient.  History of DVT/PE: Continue warfarin.   Hypothyroidism: Continue Armour Thyroid  Chronic respiratory failure with hypoxia: He is on home oxygen which was prescribed for chronic respiratory insufficiency secondary to CHF.  CKD stage III: Currently kidney function is around baseline.  BMP in a week  Anemia of chronic disease:Currently stable  Debility/weakness:Physical therapy recommended skilled nursing facility on discharge.  Social worker consulted.  Patient and wife denied skilled nursing placement.  Will be discharged to home with home health.      Discharge Diagnoses:  Principal Problem:   Cellulitis of foot Active Problems:   Hypothyroidism   CKD (chronic kidney disease) stage 3, GFR 30-59 ml/min (HCC)   Chronic respiratory failure (HCC)   Coronary artery disease involving native coronary artery of native heart without angina pectoris   Hypotension    Discharge Instructions  Discharge Instructions    Diet - low sodium heart healthy   Complete by:  As directed    Discharge instructions   Complete by:  As directed    1) Follow up home health physical therapy. 2) Follow up with your PCP in week. 3) Do a CBC/BMP test in a week. 4) Follow up with cardiology in  4 weeks.   Increase activity slowly   Complete by:  As directed      Allergies as of 01/25/2017   No Known Allergies     Medication List    STOP taking these medications   buPROPion 150 MG 24 hr tablet Commonly known as:   WELLBUTRIN XL   escitalopram 10 MG tablet Commonly known as:  LEXAPRO   folic acid 1 MG tablet Commonly known as:  FOLVITE   lisinopril 10 MG tablet Commonly known as:  PRINIVIL,ZESTRIL     TAKE these medications   CALCIUM PO Take by mouth.   carvedilol 6.25 MG tablet Commonly known as:  COREG TAKE 1 TABLET TWICE A DAY WITH MEALS   cephALEXin 500 MG capsule Commonly known as:  KEFLEX Take 1 capsule (500 mg total) by mouth every 6 (six) hours for 3 days. Start taking on:  01/26/2017   ferrous sulfate 325 (65 FE) MG tablet Take 1 tablet (325 mg total) by mouth daily with breakfast.   furosemide 40 MG tablet Commonly known as:  LASIX Take 0.5 tablets (20 mg total) by mouth daily. What changed:    how much to take  when to take this   gabapentin 600 MG tablet Commonly known as:  NEURONTIN Take 600 mg by mouth daily.   hydrocortisone 20 MG tablet Commonly known as:  CORTEF Take 20 mg by mouth. Taking three times daily 20mg /10mg /20mg    ibuprofen 200 MG tablet Commonly known as:  ADVIL,MOTRIN Take 400 mg by mouth at bedtime as needed (PAIN).   isosorbide mononitrate 30 MG 24 hr tablet Commonly known as:  IMDUR Take 0.5 tablets (15 mg total) by mouth daily.   losartan 25 MG tablet Commonly known as:  COZAAR Take 25 mg by mouth daily.   MAGNESIUM PO Take by mouth.   metoCLOPramide 5 MG tablet Commonly known as:  REGLAN Take 5 mg by mouth daily.   nitroGLYCERIN 0.4 MG SL tablet Commonly known as:  NITROSTAT PLACE 1 TABLET UNDER TONGUE EVERY 5 MINUTES AS NEEDED FOR CHEST PAIN (UP TO 3 DOSES)   pantoprazole 40 MG tablet Commonly known as:  PROTONIX Take 40 mg by mouth daily.   potassium chloride SA 20 MEQ tablet Commonly known as:  K-DUR,KLOR-CON Take 20 mEq by mouth daily.   PROAIR HFA 108 (90 Base) MCG/ACT inhaler Generic drug:  albuterol INHALE 2 PUFFS INTO THE LUNGS EVERY 6 (SIX) HOURS AS NEEDED FOR WHEEZING OR SHORTNESS OF BREATH.   promethazine  25 MG tablet Commonly known as:  PHENERGAN Take 25 mg by mouth every 6 (six) hours as needed for nausea.   thyroid 60 MG tablet Commonly known as:  ARMOUR Take 60 mg by mouth daily.   traMADol 50 MG tablet Commonly known as:  ULTRAM Take 100 mg by mouth 3 (three) times daily.   traZODone 100 MG tablet Commonly known as:  DESYREL Take 100 mg by mouth every evening.   VITAMIN D3 PO Take 1,000 capsules by mouth daily.   VITAMIN E PO Take by mouth.   warfarin 3 MG tablet Commonly known as:  COUMADIN Take as directed. If you are unsure how to take this medication, talk to your nurse or doctor. Original instructions:  TAKE AS DIRECTED BY [facility] What changed:  See the new instructions.   warfarin 3 MG tablet Commonly known as:  COUMADIN Take as directed. If you are unsure how to take this medication, talk to your nurse or doctor.  Original instructions:  TAKE AS DIRECTED BY [facility] What changed:  See the new instructions.   ZINC PO Take by mouth.      Follow-up Information    [facility] Follow up.   Specialty:  [facility] Contact information: [place] [address] [phone]        Lawerance Cruel, MD. Schedule an appointment as soon as possible for a visit in 1 week(s).   Specialty:  Family Medicine Contact information: [address] [place] [address] [phone]          No Known Allergies  Consultations:  None   Procedures/Studies: Ct Head Wo Contrast  Result Date: 01/21/2017 CLINICAL DATA:  Multiple recent falls EXAM: CT HEAD WITHOUT CONTRAST TECHNIQUE: Contiguous axial images were obtained from the base of the skull through the vertex without intravenous contrast. COMPARISON:  04/17/2016 FINDINGS: Brain: Mild atrophic changes are noted as well as areas of chronic white matter ischemic change no findings to suggest acute hemorrhage, acute infarction or space-occupying mass  lesion are noted. Vascular: No hyperdense vessel or unexpected calcification. Skull: Normal. Negative for fracture or focal lesion. Sinuses/Orbits: Diffuse mucosal thickening is noted within the right maxillary antrum. Other: None IMPRESSION: Chronic atrophic and ischemic changes without acute abnormality. Electronically Signed   By: Inez Catalina M.D.   On: 01/21/2017 15:58   Dg Chest Port 1 View  Result Date: 01/21/2017 CLINICAL DATA:  Weakness EXAM: PORTABLE CHEST 1 VIEW COMPARISON:  01/05/2017 FINDINGS: Linear densities right lung base compatible with scarring unchanged from prior studies. Negative for heart failure or pneumonia. Lungs are clear. Atherosclerotic aortic arch. IMPRESSION: No active disease. Electronically Signed   By: Franchot Gallo M.D.   On: 01/21/2017 14:08    Echo   Subjective: Patient seen and examined this morning.  Remains comfortable.  No active issues.  Ready to be discharged to home  Discharge Exam: Vitals:   01/25/17 0924 01/25/17 1016  BP: (!) 170/89 (!) 158/92  Pulse: 78 78  Resp: 16   Temp: 97.8 F (36.6 C)   SpO2: 95%    Vitals:   01/25/17 0528 01/25/17 0745 01/25/17 0924 01/25/17 1016  BP: (!) 147/86 122/64 (!) 170/89 (!) 158/92  Pulse: 86 85 78 78  Resp: 16  16   Temp: 98.2 F (36.8 C)  97.8 F (36.6 C)   TempSrc: Oral  Oral   SpO2: 96%  95%   Weight:      Height:        General: Pt is alert, awake, not in acute distress Cardiovascular: RRR, S1/S2 +, no rubs, no gallops Respiratory: CTA bilaterally, no wheezing, no rhonchi Abdominal: Soft, NT, ND, bowel sounds + Extremities: no edema, no cyanosis, improving cellulitis of left lower extremity    The results of significant diagnostics from this hospitalization (including imaging, microbiology, ancillary and laboratory) are listed below for reference.     Microbiology: Recent Results (from the past 240 hour(s))  Blood Culture (routine x 2)     Status: None (Preliminary result)    Collection Time: 01/21/17 12:43 PM  Result Value Ref Range Status   Specimen Description BLOOD RIGHT ANTECUBITAL  Final   Special Requests   Final    BOTTLES DRAWN AEROBIC AND ANAEROBIC Blood Culture adequate volume   Culture   Final    NO GROWTH 3 DAYS Performed at [facility], [address] [place], [place] [address]  Report Status PENDING  Incomplete  Blood Culture (routine x 2)     Status: None (Preliminary result)   Collection Time: 01/21/17  1:51 PM  Result Value Ref Range Status   Specimen Description BLOOD RIGHT HAND  Final   Special Requests   Final    BOTTLES DRAWN AEROBIC AND ANAEROBIC Blood Culture adequate volume   Culture   Final    NO GROWTH 3 DAYS Performed at [facility], [address], [place],  [address]    Report Status PENDING  Incomplete     Labs: BNP (last 3 results) No results for input(s): BNP in the last 8760 hours. Basic Metabolic Panel: Recent Labs  Lab 01/21/17 1325 01/22/17 0530 01/23/17 0539 01/24/17 0542 01/25/17 0603  NA 134* 137 138 138 139  K 3.4* 3.6 3.4* 3.7 3.7  CL 104 104 107 107 109  CO2 22 22 22 22 23   GLUCOSE 212* 185* 190* 162* 144*  BUN 21* 19 25* 23* 26*  CREATININE 1.73* 1.56* 1.51* 1.32* 1.31*  CALCIUM 7.9* 8.0* 8.0* 8.2* 8.2*   Liver Function Tests: Recent Labs  Lab 01/21/17 1325  AST 31  ALT 36  ALKPHOS 81  BILITOT 1.8*  PROT 6.4*  ALBUMIN 2.8*   No results for input(s): LIPASE, AMYLASE in the last 168 hours. No results for input(s): AMMONIA in the last 168 hours. CBC: Recent Labs  Lab 01/21/17 1325 01/22/17 0530 01/23/17 0539 01/24/17 0542 01/25/17 0603  WBC 21.3* 17.6* 17.4* 12.6* 13.8*  NEUTROABS 16.8*  --   --   --   --   HGB 11.2* 11.0* 10.5* 10.6* 10.5*  HCT 34.4* 34.7* 32.4* 33.6* 33.1*  MCV 81.7 82.8 82.4 83.2 82.5  PLT 370 382 379 386 405*   Cardiac Enzymes: No results for input(s): CKTOTAL, CKMB, CKMBINDEX, TROPONINI in the last 168 hours. BNP: Invalid  input(s): POCBNP CBG: Recent Labs  Lab 01/22/17 1251 01/22/17 1701 01/22/17 2139 01/23/17 0722 01/23/17 1214  GLUCAP 248* 166* 139* 172* 198*   D-Dimer No results for input(s): DDIMER in the last 72 hours. Hgb A1c No results for input(s): HGBA1C in the last 72 hours. Lipid Profile No results for input(s): CHOL, HDL, LDLCALC, TRIG, CHOLHDL, LDLDIRECT in the last 72 hours. Thyroid function studies No results for input(s): TSH, T4TOTAL, T3FREE, THYROIDAB in the last 72 hours.  Invalid input(s): FREET3 Anemia work up No results for input(s): VITAMINB12, FOLATE, FERRITIN, TIBC, IRON, RETICCTPCT in the last 72 hours. Urinalysis    Component Value Date/Time   COLORURINE YELLOW 01/21/2017 1325   APPEARANCEUR CLEAR 01/21/2017 1325   LABSPEC 1.015 01/21/2017 1325   PHURINE 5.0 01/21/2017 1325   GLUCOSEU NEGATIVE 01/21/2017 1325   HGBUR NEGATIVE 01/21/2017 1325   BILIRUBINUR NEGATIVE 01/21/2017 1325   KETONESUR NEGATIVE 01/21/2017 1325   PROTEINUR 30 (A) 01/21/2017 1325   UROBILINOGEN 0.2 03/21/2007 1015   NITRITE NEGATIVE 01/21/2017 1325   LEUKOCYTESUR NEGATIVE 01/21/2017 1325   Sepsis Labs Invalid input(s): PROCALCITONIN,  WBC,  LACTICIDVEN Microbiology Recent Results (from the past 240 hour(s))  Blood Culture (routine x 2)     Status: None (Preliminary result)   Collection Time: 01/21/17 12:43 PM  Result Value Ref Range Status   Specimen Description BLOOD RIGHT ANTECUBITAL  Final   Special Requests   Final    BOTTLES DRAWN AEROBIC AND ANAEROBIC Blood Culture adequate volume   Culture   Final    NO GROWTH 3 DAYS Performed at [facility], [address] [place], [place] [address]    Report Status PENDING  Incomplete  Blood Culture (routine x 2)     Status: None (Preliminary result)   Collection Time: 01/21/17  1:51 PM  Result Value Ref Range Status   Specimen Description BLOOD RIGHT HAND  Final   Special Requests   Final    BOTTLES DRAWN AEROBIC AND ANAEROBIC  Blood Culture adequate volume   Culture   Final    NO GROWTH 3 DAYS Performed at [facility], [place] [address], [place], [place] [address]    Report Status PENDING  Incomplete     Time coordinating discharge: Over 30 minutes  SIGNED:   Marene Lenz, MD  [facility] 01/25/2017, 12:36 PM Pager [phone]  If 7PM-7AM, please contact night-coverage [email or web address] Password [email or web address]

## 2017-01-25 NOTE — Progress Notes (Signed)
Per nurse and physician the patient spouse prefers to take the patient home.  No other CSW need identified. CSW signing off.   Kathrin Greathouse, Latanya Presser, MSW Clinical Social Worker  [phone] 01/25/2017  9:34 AM

## 2017-01-25 NOTE — Progress Notes (Signed)
Patients wife called nurse with questions concerning rehab. Wife explains she and patient would like patient to come home and not go to rehab. Wife also expresses concern of not hearing from a doctor throughout patients stay. Wife reports speaking with Elmyra Ricks, Education officer, museum, yesterday, concerning patient going to rehab. Wife does not understand why patient needs rehab. Patients wife aware of nurse paging doctor to call wife with details on hospital stay and need for rehab. Nurse paged Dr. Tawanna Solo with patient's wife's name, [name] and number, [phone] and patient has questions.

## 2017-01-26 ENCOUNTER — Other Ambulatory Visit: Payer: Self-pay

## 2017-01-26 LAB — CULTURE, BLOOD (ROUTINE X 2)
Culture: NO GROWTH
Culture: NO GROWTH
SPECIAL REQUESTS: ADEQUATE
SPECIAL REQUESTS: ADEQUATE

## 2017-01-26 MED ORDER — CARVEDILOL 6.25 MG PO TABS: 6.2500 mg | ORAL_TABLET | Freq: Two times a day (BID) | ORAL | 3 refills | Status: AC

## 2017-01-26 MED ORDER — WARFARIN SODIUM 3 MG PO TABS: ORAL_TABLET | ORAL | 0 refills | Status: DC

## 2017-01-28 ENCOUNTER — Ambulatory Visit: Payer: Medicare Other | Admitting: Neurology

## 2017-02-01 DIAGNOSIS — G629 Polyneuropathy, unspecified: Secondary | ICD-10-CM | POA: Diagnosis not present

## 2017-02-01 DIAGNOSIS — N183 Chronic kidney disease, stage 3 (moderate): Secondary | ICD-10-CM | POA: Diagnosis not present

## 2017-02-01 DIAGNOSIS — I5032 Chronic diastolic (congestive) heart failure: Secondary | ICD-10-CM | POA: Diagnosis not present

## 2017-02-01 DIAGNOSIS — D631 Anemia in chronic kidney disease: Secondary | ICD-10-CM | POA: Diagnosis not present

## 2017-02-01 DIAGNOSIS — J9610 Chronic respiratory failure, unspecified whether with hypoxia or hypercapnia: Secondary | ICD-10-CM | POA: Diagnosis not present

## 2017-02-01 DIAGNOSIS — I130 Hypertensive heart and chronic kidney disease with heart failure and stage 1 through stage 4 chronic kidney disease, or unspecified chronic kidney disease: Secondary | ICD-10-CM | POA: Diagnosis not present

## 2017-02-03 DIAGNOSIS — L0390 Cellulitis, unspecified: Secondary | ICD-10-CM | POA: Diagnosis not present

## 2017-02-03 DIAGNOSIS — G629 Polyneuropathy, unspecified: Secondary | ICD-10-CM | POA: Diagnosis not present

## 2017-02-03 DIAGNOSIS — R609 Edema, unspecified: Secondary | ICD-10-CM | POA: Diagnosis not present

## 2017-02-03 DIAGNOSIS — R2689 Other abnormalities of gait and mobility: Secondary | ICD-10-CM | POA: Diagnosis not present

## 2017-02-03 DIAGNOSIS — R531 Weakness: Secondary | ICD-10-CM | POA: Diagnosis not present

## 2017-02-05 DIAGNOSIS — I130 Hypertensive heart and chronic kidney disease with heart failure and stage 1 through stage 4 chronic kidney disease, or unspecified chronic kidney disease: Secondary | ICD-10-CM | POA: Diagnosis not present

## 2017-02-05 DIAGNOSIS — I5032 Chronic diastolic (congestive) heart failure: Secondary | ICD-10-CM | POA: Diagnosis not present

## 2017-02-05 DIAGNOSIS — G629 Polyneuropathy, unspecified: Secondary | ICD-10-CM | POA: Diagnosis not present

## 2017-02-05 DIAGNOSIS — D631 Anemia in chronic kidney disease: Secondary | ICD-10-CM | POA: Diagnosis not present

## 2017-02-05 DIAGNOSIS — N183 Chronic kidney disease, stage 3 (moderate): Secondary | ICD-10-CM | POA: Diagnosis not present

## 2017-02-05 DIAGNOSIS — J9610 Chronic respiratory failure, unspecified whether with hypoxia or hypercapnia: Secondary | ICD-10-CM | POA: Diagnosis not present

## 2017-02-07 DIAGNOSIS — N183 Chronic kidney disease, stage 3 (moderate): Secondary | ICD-10-CM | POA: Diagnosis not present

## 2017-02-07 DIAGNOSIS — G629 Polyneuropathy, unspecified: Secondary | ICD-10-CM | POA: Diagnosis not present

## 2017-02-07 DIAGNOSIS — D631 Anemia in chronic kidney disease: Secondary | ICD-10-CM | POA: Diagnosis not present

## 2017-02-07 DIAGNOSIS — J9610 Chronic respiratory failure, unspecified whether with hypoxia or hypercapnia: Secondary | ICD-10-CM | POA: Diagnosis not present

## 2017-02-07 DIAGNOSIS — I5032 Chronic diastolic (congestive) heart failure: Secondary | ICD-10-CM | POA: Diagnosis not present

## 2017-02-07 DIAGNOSIS — I130 Hypertensive heart and chronic kidney disease with heart failure and stage 1 through stage 4 chronic kidney disease, or unspecified chronic kidney disease: Secondary | ICD-10-CM | POA: Diagnosis not present

## 2017-02-09 DIAGNOSIS — G629 Polyneuropathy, unspecified: Secondary | ICD-10-CM | POA: Diagnosis not present

## 2017-02-09 DIAGNOSIS — I130 Hypertensive heart and chronic kidney disease with heart failure and stage 1 through stage 4 chronic kidney disease, or unspecified chronic kidney disease: Secondary | ICD-10-CM | POA: Diagnosis not present

## 2017-02-09 DIAGNOSIS — D631 Anemia in chronic kidney disease: Secondary | ICD-10-CM | POA: Diagnosis not present

## 2017-02-09 DIAGNOSIS — J9610 Chronic respiratory failure, unspecified whether with hypoxia or hypercapnia: Secondary | ICD-10-CM | POA: Diagnosis not present

## 2017-02-09 DIAGNOSIS — N183 Chronic kidney disease, stage 3 (moderate): Secondary | ICD-10-CM | POA: Diagnosis not present

## 2017-02-09 DIAGNOSIS — I5032 Chronic diastolic (congestive) heart failure: Secondary | ICD-10-CM | POA: Diagnosis not present

## 2017-02-10 DIAGNOSIS — I5032 Chronic diastolic (congestive) heart failure: Secondary | ICD-10-CM | POA: Diagnosis not present

## 2017-02-10 DIAGNOSIS — I130 Hypertensive heart and chronic kidney disease with heart failure and stage 1 through stage 4 chronic kidney disease, or unspecified chronic kidney disease: Secondary | ICD-10-CM | POA: Diagnosis not present

## 2017-02-10 DIAGNOSIS — N183 Chronic kidney disease, stage 3 (moderate): Secondary | ICD-10-CM | POA: Diagnosis not present

## 2017-02-10 DIAGNOSIS — G629 Polyneuropathy, unspecified: Secondary | ICD-10-CM | POA: Diagnosis not present

## 2017-02-10 DIAGNOSIS — D631 Anemia in chronic kidney disease: Secondary | ICD-10-CM | POA: Diagnosis not present

## 2017-02-10 DIAGNOSIS — J9610 Chronic respiratory failure, unspecified whether with hypoxia or hypercapnia: Secondary | ICD-10-CM | POA: Diagnosis not present

## 2017-02-14 DIAGNOSIS — I130 Hypertensive heart and chronic kidney disease with heart failure and stage 1 through stage 4 chronic kidney disease, or unspecified chronic kidney disease: Secondary | ICD-10-CM | POA: Diagnosis not present

## 2017-02-14 DIAGNOSIS — J9610 Chronic respiratory failure, unspecified whether with hypoxia or hypercapnia: Secondary | ICD-10-CM | POA: Diagnosis not present

## 2017-02-14 DIAGNOSIS — N183 Chronic kidney disease, stage 3 (moderate): Secondary | ICD-10-CM | POA: Diagnosis not present

## 2017-02-14 DIAGNOSIS — G629 Polyneuropathy, unspecified: Secondary | ICD-10-CM | POA: Diagnosis not present

## 2017-02-14 DIAGNOSIS — I5032 Chronic diastolic (congestive) heart failure: Secondary | ICD-10-CM | POA: Diagnosis not present

## 2017-02-14 DIAGNOSIS — D631 Anemia in chronic kidney disease: Secondary | ICD-10-CM | POA: Diagnosis not present

## 2017-02-16 ENCOUNTER — Encounter (HOSPITAL_COMMUNITY): Payer: Medicare Other | Admitting: Internal Medicine

## 2017-02-16 DIAGNOSIS — J9610 Chronic respiratory failure, unspecified whether with hypoxia or hypercapnia: Secondary | ICD-10-CM | POA: Diagnosis not present

## 2017-02-16 DIAGNOSIS — I5032 Chronic diastolic (congestive) heart failure: Secondary | ICD-10-CM | POA: Diagnosis not present

## 2017-02-16 DIAGNOSIS — D631 Anemia in chronic kidney disease: Secondary | ICD-10-CM | POA: Diagnosis not present

## 2017-02-16 DIAGNOSIS — G629 Polyneuropathy, unspecified: Secondary | ICD-10-CM | POA: Diagnosis not present

## 2017-02-16 DIAGNOSIS — N183 Chronic kidney disease, stage 3 (moderate): Secondary | ICD-10-CM | POA: Diagnosis not present

## 2017-02-16 DIAGNOSIS — I130 Hypertensive heart and chronic kidney disease with heart failure and stage 1 through stage 4 chronic kidney disease, or unspecified chronic kidney disease: Secondary | ICD-10-CM | POA: Diagnosis not present

## 2017-02-17 DIAGNOSIS — I130 Hypertensive heart and chronic kidney disease with heart failure and stage 1 through stage 4 chronic kidney disease, or unspecified chronic kidney disease: Secondary | ICD-10-CM | POA: Diagnosis not present

## 2017-02-17 DIAGNOSIS — I5032 Chronic diastolic (congestive) heart failure: Secondary | ICD-10-CM | POA: Diagnosis not present

## 2017-02-17 DIAGNOSIS — J9610 Chronic respiratory failure, unspecified whether with hypoxia or hypercapnia: Secondary | ICD-10-CM | POA: Diagnosis not present

## 2017-02-17 DIAGNOSIS — N183 Chronic kidney disease, stage 3 (moderate): Secondary | ICD-10-CM | POA: Diagnosis not present

## 2017-02-17 DIAGNOSIS — D631 Anemia in chronic kidney disease: Secondary | ICD-10-CM | POA: Diagnosis not present

## 2017-02-17 DIAGNOSIS — G629 Polyneuropathy, unspecified: Secondary | ICD-10-CM | POA: Diagnosis not present

## 2017-02-18 ENCOUNTER — Ambulatory Visit (INDEPENDENT_AMBULATORY_CARE_PROVIDER_SITE_OTHER): Payer: Medicare HMO | Admitting: *Deleted

## 2017-02-18 DIAGNOSIS — I2699 Other pulmonary embolism without acute cor pulmonale: Secondary | ICD-10-CM

## 2017-02-18 DIAGNOSIS — Z5181 Encounter for therapeutic drug level monitoring: Secondary | ICD-10-CM | POA: Diagnosis not present

## 2017-02-18 LAB — POCT INR: INR: 3.8

## 2017-02-18 NOTE — Patient Instructions (Signed)
Description   Do not take any Coumadin today then continue taking 1 tablet daily except 1/2 tablet on Sundays and Thursdays.  Recheck in 2 weeks.

## 2017-02-21 DIAGNOSIS — J9610 Chronic respiratory failure, unspecified whether with hypoxia or hypercapnia: Secondary | ICD-10-CM | POA: Diagnosis not present

## 2017-02-21 DIAGNOSIS — G629 Polyneuropathy, unspecified: Secondary | ICD-10-CM | POA: Diagnosis not present

## 2017-02-21 DIAGNOSIS — D631 Anemia in chronic kidney disease: Secondary | ICD-10-CM | POA: Diagnosis not present

## 2017-02-21 DIAGNOSIS — N183 Chronic kidney disease, stage 3 (moderate): Secondary | ICD-10-CM | POA: Diagnosis not present

## 2017-02-21 DIAGNOSIS — I5032 Chronic diastolic (congestive) heart failure: Secondary | ICD-10-CM | POA: Diagnosis not present

## 2017-02-21 DIAGNOSIS — I130 Hypertensive heart and chronic kidney disease with heart failure and stage 1 through stage 4 chronic kidney disease, or unspecified chronic kidney disease: Secondary | ICD-10-CM | POA: Diagnosis not present

## 2017-02-22 ENCOUNTER — Other Ambulatory Visit: Payer: Self-pay | Admitting: Family Medicine

## 2017-02-22 DIAGNOSIS — R609 Edema, unspecified: Secondary | ICD-10-CM

## 2017-02-23 ENCOUNTER — Other Ambulatory Visit: Payer: Self-pay | Admitting: Cardiovascular Disease

## 2017-02-23 DIAGNOSIS — J9610 Chronic respiratory failure, unspecified whether with hypoxia or hypercapnia: Secondary | ICD-10-CM | POA: Diagnosis not present

## 2017-02-23 DIAGNOSIS — I130 Hypertensive heart and chronic kidney disease with heart failure and stage 1 through stage 4 chronic kidney disease, or unspecified chronic kidney disease: Secondary | ICD-10-CM | POA: Diagnosis not present

## 2017-02-23 DIAGNOSIS — G629 Polyneuropathy, unspecified: Secondary | ICD-10-CM | POA: Diagnosis not present

## 2017-02-23 DIAGNOSIS — I5032 Chronic diastolic (congestive) heart failure: Secondary | ICD-10-CM | POA: Diagnosis not present

## 2017-02-23 DIAGNOSIS — N183 Chronic kidney disease, stage 3 (moderate): Secondary | ICD-10-CM | POA: Diagnosis not present

## 2017-02-23 DIAGNOSIS — D631 Anemia in chronic kidney disease: Secondary | ICD-10-CM | POA: Diagnosis not present

## 2017-02-24 ENCOUNTER — Encounter: Payer: Self-pay | Admitting: Physician Assistant

## 2017-02-25 ENCOUNTER — Other Ambulatory Visit: Payer: Medicare HMO

## 2017-02-25 DIAGNOSIS — D631 Anemia in chronic kidney disease: Secondary | ICD-10-CM | POA: Diagnosis not present

## 2017-02-25 DIAGNOSIS — G629 Polyneuropathy, unspecified: Secondary | ICD-10-CM | POA: Diagnosis not present

## 2017-02-25 DIAGNOSIS — I5032 Chronic diastolic (congestive) heart failure: Secondary | ICD-10-CM | POA: Diagnosis not present

## 2017-02-25 DIAGNOSIS — I130 Hypertensive heart and chronic kidney disease with heart failure and stage 1 through stage 4 chronic kidney disease, or unspecified chronic kidney disease: Secondary | ICD-10-CM | POA: Diagnosis not present

## 2017-02-25 DIAGNOSIS — J9610 Chronic respiratory failure, unspecified whether with hypoxia or hypercapnia: Secondary | ICD-10-CM | POA: Diagnosis not present

## 2017-02-25 DIAGNOSIS — N183 Chronic kidney disease, stage 3 (moderate): Secondary | ICD-10-CM | POA: Diagnosis not present

## 2017-02-28 DIAGNOSIS — I5032 Chronic diastolic (congestive) heart failure: Secondary | ICD-10-CM | POA: Diagnosis not present

## 2017-02-28 DIAGNOSIS — I130 Hypertensive heart and chronic kidney disease with heart failure and stage 1 through stage 4 chronic kidney disease, or unspecified chronic kidney disease: Secondary | ICD-10-CM | POA: Diagnosis not present

## 2017-02-28 DIAGNOSIS — D631 Anemia in chronic kidney disease: Secondary | ICD-10-CM | POA: Diagnosis not present

## 2017-02-28 DIAGNOSIS — J9610 Chronic respiratory failure, unspecified whether with hypoxia or hypercapnia: Secondary | ICD-10-CM | POA: Diagnosis not present

## 2017-02-28 DIAGNOSIS — N183 Chronic kidney disease, stage 3 (moderate): Secondary | ICD-10-CM | POA: Diagnosis not present

## 2017-02-28 DIAGNOSIS — G629 Polyneuropathy, unspecified: Secondary | ICD-10-CM | POA: Diagnosis not present

## 2017-03-01 ENCOUNTER — Other Ambulatory Visit: Payer: Medicare HMO

## 2017-03-02 ENCOUNTER — Inpatient Hospital Stay: Admission: RE | Admit: 2017-03-02 | Payer: Medicare HMO | Source: Ambulatory Visit

## 2017-03-02 DIAGNOSIS — G629 Polyneuropathy, unspecified: Secondary | ICD-10-CM | POA: Diagnosis not present

## 2017-03-02 DIAGNOSIS — N183 Chronic kidney disease, stage 3 (moderate): Secondary | ICD-10-CM | POA: Diagnosis not present

## 2017-03-02 DIAGNOSIS — I130 Hypertensive heart and chronic kidney disease with heart failure and stage 1 through stage 4 chronic kidney disease, or unspecified chronic kidney disease: Secondary | ICD-10-CM | POA: Diagnosis not present

## 2017-03-02 DIAGNOSIS — J9610 Chronic respiratory failure, unspecified whether with hypoxia or hypercapnia: Secondary | ICD-10-CM | POA: Diagnosis not present

## 2017-03-02 DIAGNOSIS — I5032 Chronic diastolic (congestive) heart failure: Secondary | ICD-10-CM | POA: Diagnosis not present

## 2017-03-02 DIAGNOSIS — D631 Anemia in chronic kidney disease: Secondary | ICD-10-CM | POA: Diagnosis not present

## 2017-03-03 NOTE — Progress Notes (Deleted)
Cardiology Office Note:    Date:  03/04/2017   ID:  [name], DOB March 27, 1940, MRN [id number]  PCP:  Dennis Cruel, MD  Cardiologist:  Dennis Moores, MD   Referring MD: Dennis Cruel, MD   No chief complaint on file. ***  History of Present Illness:    [name] is a 77 y.o. male with a hx of coronary artery disease, heart failure, pulmonary hypertension, hypertension, chronic kidney disease, Addison's disease, aortic insufficiency, prior DVT, prior pulmonary embolism, prior GI bleed while on Xarelto, anemia of chronic disease.  He was admitted in April 2016 with submassive pulmonary embolism with associated right heart strain and right lower extremity DVT.  He was placed on Coumadin at that time.  EF was previously reduced (30-35) but improved to normal.  He underwent PCI with a DES to the LAD and DES to the [place] in 2016.  His procedure was completed by wire induced dissection in the distal segment of the LAD treated with repeat balloon inflations.  Repeat right and left heart catheterization in 10/17 demonstrated patent stents and nonobstructive disease elsewhere.  Pressures were mildly elevated with normal cardiac output.  He was seen in our clinic in April 2018 by Dennis Kail, PA-C for shortness of breath with associated hypoxia.  Diuretics were increased and a VQ scan was negative for pulmonary embolism.  He was last seen by Dr. Ronna Gates in the [facility] in August 2018.  It was felt that his shortness of breath is multifactorial due to diastolic heart failure, obesity/obesity hypoventilation syndrome and deconditioning.  He was referred to cardiac rehabilitation.  He was admitted in December 2018 with left lower extremity cellulitis and associated sepsis.  Of note, his furosemide dose was reduced secondary to presentation with acute kidney injury.  [name] ***  Prior CV studies:   The following studies were reviewed today:  *** VQ scan  05/31/16 Very low probability for pulmonary embolism  R/L heart catheterization 11/24/15 LAD stent patent, 30 beyond stent RCA mid 44, RPDA 40, stent patent RA 11, RV 35/11, mean PA 22, mean wedge 18 Conclusion: Mild nonobstructive CAD with patent stents: Very mildly elevated R heart pressures with normal cardiac output  Echo 04/17/15 Moderate LVH, EF 60-65, normal wall motion, grade 1 diastolic dysfunction, aortic sclerosis, trivial AI, aortic root dilated (44 mm), MAC, trivial MR, moderate LAE  CPX 04/04/15 Conclusion: Exercise testing with gas exchange demonstrates a moderate to severly reduced functional capacity due primarily to a circulatory limitation. The pVO2 has dropped nearly 40% since previous CPX text in 6/16. Consider further cardiac work-up as clinically indicated.  PCI 08/21/14 PCI: 2.75 x 18 mm resolute integrity DES to the mid LAD - complicated by a wire-induced dissection in the distal segment of the LAD which was treated with repeated balloon inflations.   PCI: 2.75 x 18 mm resolute integrity DES to the RPDA   R/L [facility] 08/20/14 LAD:  Dist 80% RCA:  Mid 20%, RPDA 95% RA = 6 RV = 28/4/10 PA = 29/15 (21) PCWP = 15 Ao = 148/77 (107) LV = 138/5/18 Fick CO/CI = 7.8/3.6 Thermo CO/CI = 6.5/3.0 PVR < 1 WU Ao sat 93% PA sat = 71%, 74% High SVC sat = 68% RA sat = 69% Assessment: 1) 2V CAD  2) Normal pulmonary pressures with elevated cardiac output but no evidence of intracardiac shunt 3) Normal EF by echo  CPX 07/22/14 Conclusion: Exercise testing with gas exchange demonstrates  a mild functional impairment when compared to matched sedentary norms. The primary defect appears to be circulatory in nature but the patient's obesity is also playing a role. There was a hypertensive response to exercise. Resting spirometry suggests mild obstruction.   Echo 05/10/14 EF 60-65%, no RWMA, Gr 1 DD, trivial AI  Myoview 08/15/12 Low risk stress nuclear study with a small,  mild, fixed inferior defect consistent with inferior thinning; no ischemia. LV Ejection Fraction: 54%. LV Wall Motion: NL LV Function; NL Wall Motion  Past Medical History:  Diagnosis Date  . Acute lower GI bleeding    a. admitted to [facility] 04/11/2013;  b. 04/2014 EGD: 1.  gastric erosions, mild prox gastritis and bulbar duodenitis->Protonix.  . Addison's disease ([place])   . Anemia   . Anemia in CKD (chronic kidney disease) 06/25/2015  . Chronic back pain   . Chronic diastolic CHF (congestive heart failure) ([place])    a. His initial ejection fraction was between 30 and 35%;  b. 04/2013 Echo: EF 50-55% Gr1 DD, mild-mod MR;  c. 04/2014 Echo: EF 60-65%, Gr 1 DD, triv TR.  . CKD (chronic kidney disease), stage III ([place])   . Coronary artery disease    a. 07/2014 - s/p difficult PCI - had pressure wire analysis of LAD s/p DES in mid segment c/b wire-induced dissection in the distal segment of the LAD which was treated with repeated balloon inflations. Also had DES to rPDA - again had either spasm distal to the stent placement or an edge dissection but the vessel was small in that area; procedure aborted.  . Daily headache   . DVT (deep venous thrombosis) ([place])    a. 02/2013 superficial thrombophlebitis --> Xarelto later d/c'd 2/2 GIB;  b. 04/2014 Acute Right PT, Peroneal, Popliteal, Femoral, and common femoral DVT-->coumadin  . GERD (gastroesophageal reflux disease)   . H/O hiatal hernia   . Hepatitis    a. 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  . History of aortic insufficiency   . Hx of cardiovascular stress test    a. ETT-Myoview 7/14:  Low risk, inferior defect consistent with thinning, no ischemia, normal wall motion, EF 54%  . Hypertension   . Hypogonadism male   . Obesity   . On home oxygen therapy    a. added 04/2014 in setting of PE.  Marland Kitchen Peptic ulcer disease   . Pulmonary embolism ([place])    a. 04/2014 CTA: Bilat submassive PE ->coumadin.  . Pulmonary HTN ([place])   . Thyroid disease     Past Surgical History:    Procedure Laterality Date  . APPENDECTOMY  1950's  . CARDIAC CATHETERIZATION  06/26/2008   EF 30-35%  . CARDIAC CATHETERIZATION N/A 08/20/2014   Procedure: Right/Left Heart Cath and Coronary Angiography;  Surgeon: Jolaine Artist, MD;  Location: [facility];  Service: Cardiovascular;  Laterality: N/A;  . CARDIAC CATHETERIZATION N/A 08/21/2014   Procedure: Coronary Stent Intervention;  Surgeon: Wellington Hampshire, MD;  Location: [facility];  Service: Cardiovascular;  Laterality: N/A;  . CARDIAC CATHETERIZATION N/A 11/24/2015   Procedure: Right/Left Heart Cath and Coronary Angiography;  Surgeon: Jolaine Artist, MD;  Location: [facility];  Service: Cardiovascular;  Laterality: N/A;  . CHOLECYSTECTOMY  ~ 1965  . ESOPHAGOGASTRODUODENOSCOPY N/A 04/11/2013   Procedure: ESOPHAGOGASTRODUODENOSCOPY (EGD);  Surgeon: Missy Sabins, MD;  Location[facility] ENDOSCOPY;  Service: Endoscopy;  Laterality: N/A;  . ESOPHAGOGASTRODUODENOSCOPY N/A 05/13/2014   Procedure: ESOPHAGOGASTRODUODENOSCOPY (EGD);  Surgeon: Gwyndolyn Saxon  Paulita Fujita, MD;  Location: [facility];  Service: Endoscopy;  Laterality: N/A;  . TONSILLECTOMY  1940's  . US ECHOCARDIOGRAPHY  04/09/2010   EF 60-65%    Current Medications: No outpatient medications have been marked as taking for the 03/04/17 encounter (Appointment) with Richardson Dopp T, PA-C.     Allergies:   Patient has no known allergies.   Social History   Tobacco Use  . Smoking status: Never Smoker  . Smokeless tobacco: Never Used  Substance Use Topics  . Alcohol use: No    Alcohol/week: 0.0 oz  . Drug use: No     Family Hx: The patient's family history includes Clotting disorder in his brother, brother, brother, mother, and other. There is no history of Heart attack.  ROS:   Please see the history of present illness.    ROS All other systems reviewed and are negative.   EKGs/Labs/Other Test Reviewed:    EKG:  EKG is *** ordered today.  The ekg ordered  today demonstrates ***  Recent Labs: 05/19/2016: NT-Pro BNP 40 07/26/2016: TSH 1.85 01/21/2017: ALT 36 01/25/2017: BUN 26; Creatinine, Ser 1.31; Hemoglobin 10.5; Platelets 405; Potassium 3.7; Sodium 139   Recent Lipid Panel Lab Results  Component Value Date/Time   CHOL 177 02/27/2015 12:56 PM   TRIG 223 (H) 02/27/2015 12:56 PM   HDL 41 02/27/2015 12:56 PM   CHOLHDL 4.3 02/27/2015 12:56 PM   LDLCALC 91 02/27/2015 12:56 PM   LDLDIRECT 58 06/04/2015 02:50 PM    Physical Exam:    VS:  There were no vitals taken for this visit.    Wt Readings from Last 3 Encounters:  01/21/17 238 lb (108 kg)  09/08/16 238 lb 8 oz (108.2 kg)  07/26/16 238 lb (108 kg)     ***Physical Exam  ASSESSMENT & PLAN:    Chronic diastolic heart failure (HCC)  Chronic respiratory failure with hypoxia (HCC)  Coronary artery disease involving native coronary artery of native heart without angina pectoris  History of pulmonary embolism  CKD (chronic kidney disease) stage 3, GFR 30-59 ml/min (HCC)  Essential hypertension  Addison disease ([place])***  Dispo:  No Follow-up on file.   Medication Adjustments/Labs and Tests Ordered: Current medicines are reviewed at length with the patient today.  Concerns regarding medicines are outlined above.  Tests Ordered: No orders of the defined types were placed in this encounter.  Medication Changes: No orders of the defined types were placed in this encounter.   Signed, Richardson Dopp, PA-C  03/04/2017 8:08 AM    [facility] [address], [place], [place]  [address] Phone: [phone]; Fax: [phone]

## 2017-03-04 ENCOUNTER — Ambulatory Visit: Payer: Medicare Other | Admitting: Physician Assistant

## 2017-03-04 DIAGNOSIS — S90852A Superficial foreign body, left foot, initial encounter: Secondary | ICD-10-CM | POA: Diagnosis not present

## 2017-03-04 DIAGNOSIS — L02612 Cutaneous abscess of left foot: Secondary | ICD-10-CM | POA: Diagnosis not present

## 2017-03-04 DIAGNOSIS — J029 Acute pharyngitis, unspecified: Secondary | ICD-10-CM | POA: Diagnosis not present

## 2017-03-07 ENCOUNTER — Encounter: Payer: Medicare Other | Admitting: Sports Medicine

## 2017-03-07 DIAGNOSIS — D631 Anemia in chronic kidney disease: Secondary | ICD-10-CM | POA: Diagnosis not present

## 2017-03-07 DIAGNOSIS — J9610 Chronic respiratory failure, unspecified whether with hypoxia or hypercapnia: Secondary | ICD-10-CM | POA: Diagnosis not present

## 2017-03-07 DIAGNOSIS — I5032 Chronic diastolic (congestive) heart failure: Secondary | ICD-10-CM | POA: Diagnosis not present

## 2017-03-07 DIAGNOSIS — I130 Hypertensive heart and chronic kidney disease with heart failure and stage 1 through stage 4 chronic kidney disease, or unspecified chronic kidney disease: Secondary | ICD-10-CM | POA: Diagnosis not present

## 2017-03-07 DIAGNOSIS — G629 Polyneuropathy, unspecified: Secondary | ICD-10-CM | POA: Diagnosis not present

## 2017-03-07 DIAGNOSIS — N183 Chronic kidney disease, stage 3 (moderate): Secondary | ICD-10-CM | POA: Diagnosis not present

## 2017-03-08 ENCOUNTER — Encounter: Payer: Self-pay | Admitting: Sports Medicine

## 2017-03-08 NOTE — Progress Notes (Signed)
Patient canceled appt This encounter was created in error - please disregard.

## 2017-03-09 DIAGNOSIS — I5032 Chronic diastolic (congestive) heart failure: Secondary | ICD-10-CM | POA: Diagnosis not present

## 2017-03-09 DIAGNOSIS — N183 Chronic kidney disease, stage 3 (moderate): Secondary | ICD-10-CM | POA: Diagnosis not present

## 2017-03-09 DIAGNOSIS — J9610 Chronic respiratory failure, unspecified whether with hypoxia or hypercapnia: Secondary | ICD-10-CM | POA: Diagnosis not present

## 2017-03-09 DIAGNOSIS — I130 Hypertensive heart and chronic kidney disease with heart failure and stage 1 through stage 4 chronic kidney disease, or unspecified chronic kidney disease: Secondary | ICD-10-CM | POA: Diagnosis not present

## 2017-03-09 DIAGNOSIS — G629 Polyneuropathy, unspecified: Secondary | ICD-10-CM | POA: Diagnosis not present

## 2017-03-09 DIAGNOSIS — D631 Anemia in chronic kidney disease: Secondary | ICD-10-CM | POA: Diagnosis not present

## 2017-03-14 DIAGNOSIS — G629 Polyneuropathy, unspecified: Secondary | ICD-10-CM | POA: Diagnosis not present

## 2017-03-14 DIAGNOSIS — I130 Hypertensive heart and chronic kidney disease with heart failure and stage 1 through stage 4 chronic kidney disease, or unspecified chronic kidney disease: Secondary | ICD-10-CM | POA: Diagnosis not present

## 2017-03-14 DIAGNOSIS — D631 Anemia in chronic kidney disease: Secondary | ICD-10-CM | POA: Diagnosis not present

## 2017-03-14 DIAGNOSIS — I5032 Chronic diastolic (congestive) heart failure: Secondary | ICD-10-CM | POA: Diagnosis not present

## 2017-03-14 DIAGNOSIS — J9610 Chronic respiratory failure, unspecified whether with hypoxia or hypercapnia: Secondary | ICD-10-CM | POA: Diagnosis not present

## 2017-03-14 DIAGNOSIS — N183 Chronic kidney disease, stage 3 (moderate): Secondary | ICD-10-CM | POA: Diagnosis not present

## 2017-03-16 DIAGNOSIS — J9610 Chronic respiratory failure, unspecified whether with hypoxia or hypercapnia: Secondary | ICD-10-CM | POA: Diagnosis not present

## 2017-03-16 DIAGNOSIS — N183 Chronic kidney disease, stage 3 (moderate): Secondary | ICD-10-CM | POA: Diagnosis not present

## 2017-03-16 DIAGNOSIS — G629 Polyneuropathy, unspecified: Secondary | ICD-10-CM | POA: Diagnosis not present

## 2017-03-16 DIAGNOSIS — I130 Hypertensive heart and chronic kidney disease with heart failure and stage 1 through stage 4 chronic kidney disease, or unspecified chronic kidney disease: Secondary | ICD-10-CM | POA: Diagnosis not present

## 2017-03-16 DIAGNOSIS — I5032 Chronic diastolic (congestive) heart failure: Secondary | ICD-10-CM | POA: Diagnosis not present

## 2017-03-16 DIAGNOSIS — D631 Anemia in chronic kidney disease: Secondary | ICD-10-CM | POA: Diagnosis not present

## 2017-03-17 ENCOUNTER — Ambulatory Visit: Payer: Medicare Other | Admitting: Neurology

## 2017-03-21 DIAGNOSIS — D631 Anemia in chronic kidney disease: Secondary | ICD-10-CM | POA: Diagnosis not present

## 2017-03-21 DIAGNOSIS — J9610 Chronic respiratory failure, unspecified whether with hypoxia or hypercapnia: Secondary | ICD-10-CM | POA: Diagnosis not present

## 2017-03-21 DIAGNOSIS — G629 Polyneuropathy, unspecified: Secondary | ICD-10-CM | POA: Diagnosis not present

## 2017-03-21 DIAGNOSIS — N183 Chronic kidney disease, stage 3 (moderate): Secondary | ICD-10-CM | POA: Diagnosis not present

## 2017-03-21 DIAGNOSIS — I5032 Chronic diastolic (congestive) heart failure: Secondary | ICD-10-CM | POA: Diagnosis not present

## 2017-03-21 DIAGNOSIS — I130 Hypertensive heart and chronic kidney disease with heart failure and stage 1 through stage 4 chronic kidney disease, or unspecified chronic kidney disease: Secondary | ICD-10-CM | POA: Diagnosis not present

## 2017-03-22 ENCOUNTER — Encounter: Payer: Self-pay | Admitting: Sports Medicine

## 2017-03-23 ENCOUNTER — Ambulatory Visit: Payer: Medicare Other | Admitting: Physician Assistant

## 2017-03-23 NOTE — Progress Notes (Signed)
Patient cancelled appointment.   This encounter was created in error - please disregard. 

## 2017-03-24 DIAGNOSIS — N183 Chronic kidney disease, stage 3 (moderate): Secondary | ICD-10-CM | POA: Diagnosis not present

## 2017-03-24 DIAGNOSIS — G629 Polyneuropathy, unspecified: Secondary | ICD-10-CM | POA: Diagnosis not present

## 2017-03-24 DIAGNOSIS — I130 Hypertensive heart and chronic kidney disease with heart failure and stage 1 through stage 4 chronic kidney disease, or unspecified chronic kidney disease: Secondary | ICD-10-CM | POA: Diagnosis not present

## 2017-03-24 DIAGNOSIS — J9610 Chronic respiratory failure, unspecified whether with hypoxia or hypercapnia: Secondary | ICD-10-CM | POA: Diagnosis not present

## 2017-03-24 DIAGNOSIS — D631 Anemia in chronic kidney disease: Secondary | ICD-10-CM | POA: Diagnosis not present

## 2017-03-24 DIAGNOSIS — I5032 Chronic diastolic (congestive) heart failure: Secondary | ICD-10-CM | POA: Diagnosis not present

## 2017-03-30 DIAGNOSIS — I130 Hypertensive heart and chronic kidney disease with heart failure and stage 1 through stage 4 chronic kidney disease, or unspecified chronic kidney disease: Secondary | ICD-10-CM | POA: Diagnosis not present

## 2017-03-30 DIAGNOSIS — N183 Chronic kidney disease, stage 3 (moderate): Secondary | ICD-10-CM | POA: Diagnosis not present

## 2017-03-30 DIAGNOSIS — J9610 Chronic respiratory failure, unspecified whether with hypoxia or hypercapnia: Secondary | ICD-10-CM | POA: Diagnosis not present

## 2017-03-30 DIAGNOSIS — D631 Anemia in chronic kidney disease: Secondary | ICD-10-CM | POA: Diagnosis not present

## 2017-03-30 DIAGNOSIS — G629 Polyneuropathy, unspecified: Secondary | ICD-10-CM | POA: Diagnosis not present

## 2017-03-30 DIAGNOSIS — I5032 Chronic diastolic (congestive) heart failure: Secondary | ICD-10-CM | POA: Diagnosis not present

## 2017-04-01 DIAGNOSIS — J9610 Chronic respiratory failure, unspecified whether with hypoxia or hypercapnia: Secondary | ICD-10-CM | POA: Diagnosis not present

## 2017-04-01 DIAGNOSIS — N183 Chronic kidney disease, stage 3 (moderate): Secondary | ICD-10-CM | POA: Diagnosis not present

## 2017-04-01 DIAGNOSIS — G629 Polyneuropathy, unspecified: Secondary | ICD-10-CM | POA: Diagnosis not present

## 2017-04-01 DIAGNOSIS — D631 Anemia in chronic kidney disease: Secondary | ICD-10-CM | POA: Diagnosis not present

## 2017-04-01 DIAGNOSIS — I5032 Chronic diastolic (congestive) heart failure: Secondary | ICD-10-CM | POA: Diagnosis not present

## 2017-04-01 DIAGNOSIS — I130 Hypertensive heart and chronic kidney disease with heart failure and stage 1 through stage 4 chronic kidney disease, or unspecified chronic kidney disease: Secondary | ICD-10-CM | POA: Diagnosis not present

## 2017-04-04 ENCOUNTER — Encounter: Payer: Self-pay | Admitting: Podiatry

## 2017-04-04 DIAGNOSIS — M47812 Spondylosis without myelopathy or radiculopathy, cervical region: Secondary | ICD-10-CM | POA: Diagnosis not present

## 2017-04-04 DIAGNOSIS — G629 Polyneuropathy, unspecified: Secondary | ICD-10-CM | POA: Diagnosis not present

## 2017-04-04 DIAGNOSIS — I130 Hypertensive heart and chronic kidney disease with heart failure and stage 1 through stage 4 chronic kidney disease, or unspecified chronic kidney disease: Secondary | ICD-10-CM | POA: Diagnosis not present

## 2017-04-04 DIAGNOSIS — M48061 Spinal stenosis, lumbar region without neurogenic claudication: Secondary | ICD-10-CM | POA: Diagnosis not present

## 2017-04-04 DIAGNOSIS — M47816 Spondylosis without myelopathy or radiculopathy, lumbar region: Secondary | ICD-10-CM | POA: Diagnosis not present

## 2017-04-04 DIAGNOSIS — M5030 Other cervical disc degeneration, unspecified cervical region: Secondary | ICD-10-CM | POA: Diagnosis not present

## 2017-04-08 DIAGNOSIS — M47812 Spondylosis without myelopathy or radiculopathy, cervical region: Secondary | ICD-10-CM | POA: Diagnosis not present

## 2017-04-08 DIAGNOSIS — M5030 Other cervical disc degeneration, unspecified cervical region: Secondary | ICD-10-CM | POA: Diagnosis not present

## 2017-04-08 DIAGNOSIS — M48061 Spinal stenosis, lumbar region without neurogenic claudication: Secondary | ICD-10-CM | POA: Diagnosis not present

## 2017-04-08 DIAGNOSIS — I130 Hypertensive heart and chronic kidney disease with heart failure and stage 1 through stage 4 chronic kidney disease, or unspecified chronic kidney disease: Secondary | ICD-10-CM | POA: Diagnosis not present

## 2017-04-08 DIAGNOSIS — M47816 Spondylosis without myelopathy or radiculopathy, lumbar region: Secondary | ICD-10-CM | POA: Diagnosis not present

## 2017-04-08 DIAGNOSIS — G629 Polyneuropathy, unspecified: Secondary | ICD-10-CM | POA: Diagnosis not present

## 2017-04-08 NOTE — Progress Notes (Signed)
This encounter was created in error - please disregard.

## 2017-04-12 DIAGNOSIS — I130 Hypertensive heart and chronic kidney disease with heart failure and stage 1 through stage 4 chronic kidney disease, or unspecified chronic kidney disease: Secondary | ICD-10-CM | POA: Diagnosis not present

## 2017-04-12 DIAGNOSIS — M5030 Other cervical disc degeneration, unspecified cervical region: Secondary | ICD-10-CM | POA: Diagnosis not present

## 2017-04-12 DIAGNOSIS — M47816 Spondylosis without myelopathy or radiculopathy, lumbar region: Secondary | ICD-10-CM | POA: Diagnosis not present

## 2017-04-12 DIAGNOSIS — M48061 Spinal stenosis, lumbar region without neurogenic claudication: Secondary | ICD-10-CM | POA: Diagnosis not present

## 2017-04-12 DIAGNOSIS — G629 Polyneuropathy, unspecified: Secondary | ICD-10-CM | POA: Diagnosis not present

## 2017-04-12 DIAGNOSIS — M47812 Spondylosis without myelopathy or radiculopathy, cervical region: Secondary | ICD-10-CM | POA: Diagnosis not present

## 2017-04-13 DIAGNOSIS — I130 Hypertensive heart and chronic kidney disease with heart failure and stage 1 through stage 4 chronic kidney disease, or unspecified chronic kidney disease: Secondary | ICD-10-CM | POA: Diagnosis not present

## 2017-04-13 DIAGNOSIS — M5030 Other cervical disc degeneration, unspecified cervical region: Secondary | ICD-10-CM | POA: Diagnosis not present

## 2017-04-13 DIAGNOSIS — M47812 Spondylosis without myelopathy or radiculopathy, cervical region: Secondary | ICD-10-CM | POA: Diagnosis not present

## 2017-04-13 DIAGNOSIS — M48061 Spinal stenosis, lumbar region without neurogenic claudication: Secondary | ICD-10-CM | POA: Diagnosis not present

## 2017-04-13 DIAGNOSIS — G629 Polyneuropathy, unspecified: Secondary | ICD-10-CM | POA: Diagnosis not present

## 2017-04-13 DIAGNOSIS — M47816 Spondylosis without myelopathy or radiculopathy, lumbar region: Secondary | ICD-10-CM | POA: Diagnosis not present

## 2017-04-18 DIAGNOSIS — M47812 Spondylosis without myelopathy or radiculopathy, cervical region: Secondary | ICD-10-CM | POA: Diagnosis not present

## 2017-04-18 DIAGNOSIS — M47816 Spondylosis without myelopathy or radiculopathy, lumbar region: Secondary | ICD-10-CM | POA: Diagnosis not present

## 2017-04-18 DIAGNOSIS — G629 Polyneuropathy, unspecified: Secondary | ICD-10-CM | POA: Diagnosis not present

## 2017-04-18 DIAGNOSIS — M5030 Other cervical disc degeneration, unspecified cervical region: Secondary | ICD-10-CM | POA: Diagnosis not present

## 2017-04-18 DIAGNOSIS — I130 Hypertensive heart and chronic kidney disease with heart failure and stage 1 through stage 4 chronic kidney disease, or unspecified chronic kidney disease: Secondary | ICD-10-CM | POA: Diagnosis not present

## 2017-04-18 DIAGNOSIS — M48061 Spinal stenosis, lumbar region without neurogenic claudication: Secondary | ICD-10-CM | POA: Diagnosis not present

## 2017-04-19 ENCOUNTER — Ambulatory Visit (INDEPENDENT_AMBULATORY_CARE_PROVIDER_SITE_OTHER): Payer: Medicare HMO | Admitting: *Deleted

## 2017-04-19 DIAGNOSIS — I82401 Acute embolism and thrombosis of unspecified deep veins of right lower extremity: Secondary | ICD-10-CM | POA: Diagnosis not present

## 2017-04-19 DIAGNOSIS — Z5181 Encounter for therapeutic drug level monitoring: Secondary | ICD-10-CM | POA: Diagnosis not present

## 2017-04-19 LAB — POCT INR: INR: 1

## 2017-04-19 MED ORDER — WARFARIN SODIUM 3 MG PO TABS: ORAL_TABLET | ORAL | 0 refills | Status: DC

## 2017-04-19 NOTE — Patient Instructions (Signed)
Description   Today and tomorrow take 1.5 tablets then continue taking 1 tablet daily except 1/2 tablet on Sundays and Thursdays.  Recheck in 1 week.

## 2017-04-22 ENCOUNTER — Emergency Department (HOSPITAL_COMMUNITY): Payer: Medicare HMO

## 2017-04-22 ENCOUNTER — Encounter (HOSPITAL_COMMUNITY): Payer: Self-pay | Admitting: Internal Medicine

## 2017-04-22 ENCOUNTER — Inpatient Hospital Stay (HOSPITAL_COMMUNITY)
Admission: EM | Admit: 2017-04-22 | Discharge: 2017-04-25 | DRG: 644 | Disposition: A | Discharge status: Home / Self Care | Payer: Medicare HMO | Attending: Internal Medicine | Admitting: Internal Medicine

## 2017-04-22 ENCOUNTER — Other Ambulatory Visit: Payer: Self-pay

## 2017-04-22 DIAGNOSIS — E1122 Type 2 diabetes mellitus with diabetic chronic kidney disease: Secondary | ICD-10-CM | POA: Diagnosis present

## 2017-04-22 DIAGNOSIS — E119 Type 2 diabetes mellitus without complications: Secondary | ICD-10-CM | POA: Diagnosis not present

## 2017-04-22 DIAGNOSIS — E1165 Type 2 diabetes mellitus with hyperglycemia: Secondary | ICD-10-CM | POA: Diagnosis present

## 2017-04-22 DIAGNOSIS — Z86711 Personal history of pulmonary embolism: Secondary | ICD-10-CM

## 2017-04-22 DIAGNOSIS — R509 Fever, unspecified: Secondary | ICD-10-CM | POA: Diagnosis present

## 2017-04-22 DIAGNOSIS — I5032 Chronic diastolic (congestive) heart failure: Secondary | ICD-10-CM | POA: Diagnosis present

## 2017-04-22 DIAGNOSIS — Z8672 Personal history of thrombophlebitis: Secondary | ICD-10-CM | POA: Diagnosis not present

## 2017-04-22 DIAGNOSIS — I2510 Atherosclerotic heart disease of native coronary artery without angina pectoris: Secondary | ICD-10-CM | POA: Diagnosis present

## 2017-04-22 DIAGNOSIS — R0902 Hypoxemia: Secondary | ICD-10-CM | POA: Diagnosis not present

## 2017-04-22 DIAGNOSIS — D631 Anemia in chronic kidney disease: Secondary | ICD-10-CM | POA: Diagnosis present

## 2017-04-22 DIAGNOSIS — T380X5A Adverse effect of glucocorticoids and synthetic analogues, initial encounter: Secondary | ICD-10-CM | POA: Diagnosis present

## 2017-04-22 DIAGNOSIS — R6510 Systemic inflammatory response syndrome (SIRS) of non-infectious origin without acute organ dysfunction: Secondary | ICD-10-CM | POA: Diagnosis present

## 2017-04-22 DIAGNOSIS — Z9981 Dependence on supplemental oxygen: Secondary | ICD-10-CM | POA: Diagnosis not present

## 2017-04-22 DIAGNOSIS — K219 Gastro-esophageal reflux disease without esophagitis: Secondary | ICD-10-CM | POA: Diagnosis present

## 2017-04-22 DIAGNOSIS — Z832 Family history of diseases of the blood and blood-forming organs and certain disorders involving the immune mechanism: Secondary | ICD-10-CM | POA: Diagnosis not present

## 2017-04-22 DIAGNOSIS — D649 Anemia, unspecified: Secondary | ICD-10-CM | POA: Diagnosis present

## 2017-04-22 DIAGNOSIS — Z955 Presence of coronary angioplasty implant and graft: Secondary | ICD-10-CM | POA: Diagnosis not present

## 2017-04-22 DIAGNOSIS — D72829 Elevated white blood cell count, unspecified: Secondary | ICD-10-CM | POA: Diagnosis not present

## 2017-04-22 DIAGNOSIS — I5030 Unspecified diastolic (congestive) heart failure: Secondary | ICD-10-CM | POA: Diagnosis not present

## 2017-04-22 DIAGNOSIS — Z7901 Long term (current) use of anticoagulants: Secondary | ICD-10-CM

## 2017-04-22 DIAGNOSIS — E039 Hypothyroidism, unspecified: Secondary | ICD-10-CM | POA: Diagnosis present

## 2017-04-22 DIAGNOSIS — M47816 Spondylosis without myelopathy or radiculopathy, lumbar region: Secondary | ICD-10-CM | POA: Diagnosis not present

## 2017-04-22 DIAGNOSIS — A419 Sepsis, unspecified organism: Secondary | ICD-10-CM | POA: Diagnosis not present

## 2017-04-22 DIAGNOSIS — R945 Abnormal results of liver function studies: Secondary | ICD-10-CM

## 2017-04-22 DIAGNOSIS — I130 Hypertensive heart and chronic kidney disease with heart failure and stage 1 through stage 4 chronic kidney disease, or unspecified chronic kidney disease: Secondary | ICD-10-CM | POA: Diagnosis present

## 2017-04-22 DIAGNOSIS — R531 Weakness: Secondary | ICD-10-CM | POA: Diagnosis not present

## 2017-04-22 DIAGNOSIS — Z86718 Personal history of other venous thrombosis and embolism: Secondary | ICD-10-CM | POA: Diagnosis not present

## 2017-04-22 DIAGNOSIS — N183 Chronic kidney disease, stage 3 unspecified: Secondary | ICD-10-CM | POA: Diagnosis present

## 2017-04-22 DIAGNOSIS — M48061 Spinal stenosis, lumbar region without neurogenic claudication: Secondary | ICD-10-CM | POA: Diagnosis not present

## 2017-04-22 DIAGNOSIS — R110 Nausea: Secondary | ICD-10-CM | POA: Diagnosis not present

## 2017-04-22 DIAGNOSIS — E271 Primary adrenocortical insufficiency: Principal | ICD-10-CM | POA: Diagnosis present

## 2017-04-22 DIAGNOSIS — M5030 Other cervical disc degeneration, unspecified cervical region: Secondary | ICD-10-CM | POA: Diagnosis not present

## 2017-04-22 DIAGNOSIS — G629 Polyneuropathy, unspecified: Secondary | ICD-10-CM | POA: Diagnosis not present

## 2017-04-22 DIAGNOSIS — R0602 Shortness of breath: Secondary | ICD-10-CM | POA: Diagnosis not present

## 2017-04-22 DIAGNOSIS — R000 Tachycardia, unspecified: Secondary | ICD-10-CM | POA: Diagnosis not present

## 2017-04-22 DIAGNOSIS — M47812 Spondylosis without myelopathy or radiculopathy, cervical region: Secondary | ICD-10-CM | POA: Diagnosis not present

## 2017-04-22 LAB — COMPREHENSIVE METABOLIC PANEL
ALK PHOS: 116 U/L (ref 38–126)
ALT: 86 U/L — AB (ref 17–63)
AST: 54 U/L — AB (ref 15–41)
Albumin: 3 g/dL — ABNORMAL LOW (ref 3.5–5.0)
Anion gap: 11 (ref 5–15)
BILIRUBIN TOTAL: 0.8 mg/dL (ref 0.3–1.2)
BUN: 23 mg/dL — AB (ref 6–20)
CALCIUM: 8.6 mg/dL — AB (ref 8.9–10.3)
CO2: 23 mmol/L (ref 22–32)
CREATININE: 1.65 mg/dL — AB (ref 0.61–1.24)
Chloride: 100 mmol/L — ABNORMAL LOW (ref 101–111)
GFR calc Af Amer: 45 mL/min — ABNORMAL LOW (ref >60)
GFR, EST NON AFRICAN AMERICAN: 39 mL/min — AB (ref >60)
Glucose, Bld: 351 mg/dL — ABNORMAL HIGH (ref 65–99)
Potassium: 3.5 mmol/L (ref 3.5–5.1)
Sodium: 134 mmol/L — ABNORMAL LOW (ref 135–145)
TOTAL PROTEIN: 6.6 g/dL (ref 6.5–8.1)

## 2017-04-22 LAB — I-STAT VENOUS BLOOD GAS, ED
ACID-BASE DEFICIT: 4 mmol/L — AB (ref 0.0–2.0)
Bicarbonate: 18.7 mmol/L — ABNORMAL LOW (ref 20.0–28.0)
O2 SAT: 81 %
PH VEN: 7.437 — AB (ref 7.250–7.430)
TCO2: 20 mmol/L — AB (ref 22–32)
pCO2, Ven: 27.8 mmHg — ABNORMAL LOW (ref 44.0–60.0)
pO2, Ven: 43 mmHg (ref 32.0–45.0)

## 2017-04-22 LAB — URINALYSIS, ROUTINE W REFLEX MICROSCOPIC
BILIRUBIN URINE: NEGATIVE
Glucose, UA: >=500 mg/dL — AB
HGB URINE DIPSTICK: NEGATIVE
Ketones, ur: NEGATIVE mg/dL
LEUKOCYTES UA: NEGATIVE
Nitrite: NEGATIVE
PROTEIN: NEGATIVE mg/dL
RBC / HPF: NONE SEEN RBC/hpf (ref 0–5)
SQUAMOUS EPITHELIAL / LPF: NONE SEEN
Specific Gravity, Urine: 1.014 (ref 1.005–1.030)
pH: 6 (ref 5.0–8.0)

## 2017-04-22 LAB — CBC WITH DIFFERENTIAL/PLATELET
BASOS PCT: 0 %
Basophils Absolute: 0 10*3/uL (ref 0.0–0.1)
Eosinophils Absolute: 0.3 10*3/uL (ref 0.0–0.7)
Eosinophils Relative: 1 %
HCT: 36.6 % — ABNORMAL LOW (ref 39.0–52.0)
HEMOGLOBIN: 11.2 g/dL — AB (ref 13.0–17.0)
LYMPHS ABS: 1.9 10*3/uL (ref 0.7–4.0)
LYMPHS PCT: 7 %
MCH: 24.3 pg — AB (ref 26.0–34.0)
MCHC: 30.6 g/dL (ref 30.0–36.0)
MCV: 79.4 fL (ref 78.0–100.0)
MONO ABS: 1.9 10*3/uL — AB (ref 0.1–1.0)
Monocytes Relative: 7 %
NEUTROS ABS: 23.6 10*3/uL — AB (ref 1.7–7.7)
Neutrophils Relative %: 85 %
Platelets: 277 10*3/uL (ref 150–400)
RBC: 4.61 MIL/uL (ref 4.22–5.81)
RDW: 18.2 % — ABNORMAL HIGH (ref 11.5–15.5)
WBC: 27.7 10*3/uL — ABNORMAL HIGH (ref 4.0–10.5)

## 2017-04-22 LAB — CBG MONITORING, ED: GLUCOSE-CAPILLARY: 388 mg/dL — AB (ref 65–99)

## 2017-04-22 LAB — I-STAT CG4 LACTIC ACID, ED
LACTIC ACID, VENOUS: 2.86 mmol/L — AB (ref 0.5–1.9)
Lactic Acid, Venous: 3.38 mmol/L (ref 0.5–1.9)

## 2017-04-22 LAB — INFLUENZA PANEL BY PCR (TYPE A & B)
Influenza A By PCR: NEGATIVE
Influenza B By PCR: NEGATIVE

## 2017-04-22 LAB — I-STAT TROPONIN, ED: TROPONIN I, POC: 0.01 ng/mL (ref 0.00–0.08)

## 2017-04-22 LAB — PROTIME-INR
INR: 1.31
Prothrombin Time: 16.2 seconds — ABNORMAL HIGH (ref 11.4–15.2)

## 2017-04-22 LAB — T4, FREE: FREE T4: 0.72 ng/dL (ref 0.61–1.12)

## 2017-04-22 LAB — BRAIN NATRIURETIC PEPTIDE: B Natriuretic Peptide: 35.3 pg/mL (ref 0.0–100.0)

## 2017-04-22 LAB — TSH: TSH: 1.135 u[IU]/mL (ref 0.350–4.500)

## 2017-04-22 LAB — LACTIC ACID, PLASMA: Lactic Acid, Venous: 2.7 mmol/L (ref 0.5–1.9)

## 2017-04-22 MED ORDER — IBUPROFEN 800 MG PO TABS
800.0000 mg | ORAL_TABLET | Freq: Once | ORAL | Status: AC
Start: 2017-04-22 — End: 2017-04-22
  Administered 2017-04-22: 800 mg via ORAL
  Filled 2017-04-22: qty 1

## 2017-04-22 MED ORDER — ACETAMINOPHEN 325 MG PO TABS
650.0000 mg | ORAL_TABLET | Freq: Once | ORAL | Status: AC
  Administered 2017-04-22: 650 mg via ORAL
  Filled 2017-04-22: qty 2

## 2017-04-22 MED ORDER — WARFARIN - PHARMACIST DOSING INPATIENT: Freq: Every day | Status: DC

## 2017-04-22 MED ORDER — PIPERACILLIN-TAZOBACTAM 3.375 G IVPB 30 MIN
3.3750 g | Freq: Once | INTRAVENOUS | Status: AC
  Administered 2017-04-22: 3.375 g via INTRAVENOUS
  Filled 2017-04-22: qty 50

## 2017-04-22 MED ORDER — PIPERACILLIN-TAZOBACTAM 3.375 G IVPB
3.3750 g | Freq: Three times a day (TID) | INTRAVENOUS | Status: DC
  Administered 2017-04-23 – 2017-04-25 (×7): 3.375 g via INTRAVENOUS
  Filled 2017-04-22 (×8): qty 50

## 2017-04-22 MED ORDER — HYDROCORTISONE NA SUCCINATE PF 100 MG IJ SOLR
100.0000 mg | Freq: Once | INTRAMUSCULAR | Status: AC
  Administered 2017-04-22: 100 mg via INTRAVENOUS
  Filled 2017-04-22: qty 2

## 2017-04-22 MED ORDER — IOPAMIDOL (ISOVUE-370) INJECTION 76%
INTRAVENOUS | Status: AC
  Administered 2017-04-22: 80 mL
  Filled 2017-04-22: qty 100

## 2017-04-22 MED ORDER — VANCOMYCIN HCL IN DEXTROSE 750-5 MG/150ML-% IV SOLN
750.0000 mg | Freq: Two times a day (BID) | INTRAVENOUS | Status: DC
  Administered 2017-04-23: 750 mg via INTRAVENOUS
  Filled 2017-04-22: qty 150

## 2017-04-22 MED ORDER — SODIUM CHLORIDE 0.9 % IV BOLUS
500.0000 mL | Freq: Once | INTRAVENOUS | Status: AC
  Administered 2017-04-22: 500 mL via INTRAVENOUS

## 2017-04-22 MED ORDER — ALBUTEROL SULFATE (2.5 MG/3ML) 0.083% IN NEBU: 5.0000 mg | INHALATION_SOLUTION | Freq: Once | RESPIRATORY_TRACT | Status: DC

## 2017-04-22 MED ORDER — SODIUM CHLORIDE 0.9 % IV BOLUS
1000.0000 mL | Freq: Once | INTRAVENOUS | Status: AC
  Administered 2017-04-22: 1000 mL via INTRAVENOUS

## 2017-04-22 MED ORDER — VANCOMYCIN HCL IN DEXTROSE 1-5 GM/200ML-% IV SOLN
1000.0000 mg | Freq: Once | INTRAVENOUS | Status: AC
  Administered 2017-04-22: 1000 mg via INTRAVENOUS
  Filled 2017-04-22: qty 200

## 2017-04-22 MED ORDER — WARFARIN SODIUM 5 MG PO TABS
5.0000 mg | ORAL_TABLET | ORAL | Status: AC
  Administered 2017-04-23: 5 mg via ORAL
  Filled 2017-04-22: qty 1

## 2017-04-22 MED ORDER — ACETAMINOPHEN 325 MG PO TABS
325.0000 mg | ORAL_TABLET | Freq: Once | ORAL | Status: AC
  Administered 2017-04-22: 325 mg via ORAL
  Filled 2017-04-22: qty 1

## 2017-04-22 NOTE — ED Notes (Signed)
[name] (wife) Is going home for the night. She would like to be notified of any changes in the patients status [phone]

## 2017-04-22 NOTE — H&P (Signed)
TRH H&P   Patient Demographics:    [name], is a 77 y.o. male  MRN: [id number]   DOB - 1940-08-05  Admit Date - 04/22/2017  Outpatient Primary MD for the patient is Lawerance Cruel, MD  Referring MD/NP/PA:  Joline Maxcy  Outpatient Specialists:    Patient coming from:   home  Chief Complaint  Patient presents with  . Shortness of Breath  . Weakness      HPI:    [name]  is a 77 y.o. male, w Addisons disease, Anemia, CHF, CAD, CKD stage 3, DVT, PE, apparently c/o dyspnea today. Pt noted subjective fever.  Denies cough, cp, palp, n/v, diarrhea, brbpr, dysuria.    In ED.   CTA chest IMPRESSION: 1. Limited study secondary to poor contrast opacification of the distal pulmonary branch vessels. No gross acute central embolus is seen. 2. No acute pulmonary infiltrates. 3. Marked steatosis  CT brain IMPRESSION: Chronic ischemic microangiopathy without acute intracranial Abnormality.  Urinalysis negative  Na 134, K 3.5, Bun 23, Creatinine 1.65 Ast 54, Alt 86, Alk phos 116, T. Bili 0.8  Glucose 351  Wbc 27.7, Hgb 11.2, Plt 277  INR 1.31  Trop 0.01  Lactic acid 2.86, Ph 7.437, Co2 27.8, Po2 43  Tsh 1.135  Pt will be admitted for dyspnea, sepsis.            Review of systems:    In addition to the HPI above,   No Fever-chills, No Headache, No changes with Vision or hearing, No problems swallowing food or Liquids, No Chest pain, Cough or Shortness of Breath, No Abdominal pain, No Nausea or Vommitting, Bowel movements are regular, No Blood in stool or Urine, No dysuria, No new skin rashes or bruises, No new joints pains-aches,  No new weakness, tingling, numbness in any extremity, No recent weight gain or loss, No polyuria, polydypsia or polyphagia, No significant Mental Stressors.  A full 10 point Review of Systems was  done, except as stated above, all other Review of Systems were negative.   With Past History of the following :    Past Medical History:  Diagnosis Date  . Acute lower GI bleeding    a. admitted to [facility] 04/11/2013;  b. 04/2014 EGD: 1.  gastric erosions, mild prox gastritis and bulbar duodenitis->Protonix.  . 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 Anemia   . Anemia in CKD (chronic kidney disease) 06/25/2015  . Chronic back pain   . Chronic diastolic CHF (congestive heart failure) ([place])    a. His initial ejection fraction was between 30 and 35%;  b. 04/2013 Echo: EF 50-55% Gr1 DD, mild-mod MR;  c. 04/2014 Echo: EF 60-65%, Gr 1 DD, triv TR.  . CKD (chronic kidney disease), stage III ([place])   . Coronary artery disease    a. 07/2014 - s/p difficult PCI - had pressure wire analysis  of LAD s/p DES in mid segment c/b wire-induced dissection in the distal segment of the LAD which was treated with repeated balloon inflations. Also had DES to rPDA - again had either spasm distal to the stent placement or an edge dissection but the vessel was small in that area; procedure aborted.  . Daily headache   . DVT (deep venous thrombosis) ([place])    a. 02/2013 superficial thrombophlebitis --> Xarelto later d/c'd 2/2 GIB;  b. 04/2014 Acute Right PT, Peroneal, Popliteal, Femoral, and common femoral DVT-->coumadin  . GERD (gastroesophageal reflux disease)   . H/O hiatal hernia   . Hepatitis    a. 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  . History of aortic insufficiency   . Hx of cardiovascular stress test    a. ETT-Myoview 7/14:  Low risk, inferior defect consistent with thinning, no ischemia, normal wall motion, EF 54%  . Hypertension   . Hypogonadism male   . Obesity   . On home oxygen therapy    a. added 04/2014 in setting of PE.  Marland Kitchen Peptic ulcer disease   . Pulmonary embolism ([place])    a. 04/2014 CTA: Bilat submassive PE ->coumadin.  . Pulmonary HTN ([place])   . Thyroid disease       Past Surgical History:  Procedure Laterality Date  .  APPENDECTOMY  1950's  . CARDIAC CATHETERIZATION  06/26/2008   EF 30-35%  . CARDIAC CATHETERIZATION N/A 08/20/2014   Procedure: Right/Left Heart Cath and Coronary Angiography;  Surgeon: Jolaine Artist, MD;  Location: [place] [facility];  Service: Cardiovascular;  Laterality: N/A;  . CARDIAC CATHETERIZATION N/A 08/21/2014   Procedure: Coronary Stent Intervention;  Surgeon: Wellington Hampshire, MD;  Location: [place] [facility];  Service: Cardiovascular;  Laterality: N/A;  . CARDIAC CATHETERIZATION N/A 11/24/2015   Procedure: Right/Left Heart Cath and Coronary Angiography;  Surgeon: Jolaine Artist, MD;  Location: [place] [facility];  Service: Cardiovascular;  Laterality: N/A;  . CHOLECYSTECTOMY  ~ 1965  . ESOPHAGOGASTRODUODENOSCOPY N/A 04/11/2013   Procedure: ESOPHAGOGASTRODUODENOSCOPY (EGD);  Surgeon: Missy Sabins, MD;  Location[facility] ENDOSCOPY;  Service: Endoscopy;  Laterality: N/A;  . ESOPHAGOGASTRODUODENOSCOPY N/A 05/13/2014   Procedure: ESOPHAGOGASTRODUODENOSCOPY (EGD);  Surgeon: Arta Silence, MD;  Location: [facility] ENDOSCOPY;  Service: Endoscopy;  Laterality: N/A;  . TONSILLECTOMY  1940's  . US ECHOCARDIOGRAPHY  04/09/2010   EF 60-65%      Social History:     Social History   Tobacco Use  . Smoking status: Never Smoker  . Smokeless tobacco: Never Used  Substance Use Topics  . Alcohol use: No    Alcohol/week: 0.0 oz     Lives - at home  Mobility - walks by self   Family History :     Family History  Problem Relation Age of Onset  . Clotting disorder Mother   . Clotting disorder Brother   . Clotting disorder Brother   . Clotting disorder Brother   . Clotting disorder Other        Niece.  Marland Kitchen Heart attack Neg Hx       Home Medications:   Prior to Admission medications   Medication Sig Start Date End Date Taking? Authorizing Provider  CALCIUM PO Take by mouth.    [provider]  carvedilol (COREG) 6.25 MG tablet Take 1 tablet (6.25 mg total) by mouth 2 (two)  times daily with a meal. 01/26/17   Larey Dresser, MD  Cholecalciferol (VITAMIN D3 PO) Take 1,000 capsules  by mouth daily.     [provider]  ferrous sulfate 325 (65 FE) MG tablet Take 1 tablet (325 mg total) by mouth daily with breakfast. 04/17/15   Florencia Reasons, MD  furosemide (LASIX) 40 MG tablet Take 0.5 tablets (20 mg total) by mouth daily. 01/25/17   Shelly Coss, MD  gabapentin (NEURONTIN) 600 MG tablet Take 600 mg by mouth daily.    [provider]  hydrocortisone (CORTEF) 20 MG tablet Take 20 mg by mouth. Taking three times daily 86m/10mg/20mg    [provider]  ibuprofen (ADVIL,MOTRIN) 200 MG tablet Take 400 mg by mouth at bedtime as needed (PAIN).    [provider]  isosorbide mononitrate (IMDUR) 30 MG 24 hr tablet Take 0.5 tablets (15 mg total) by mouth daily. 09/05/14   WRichardson DoppT, PA-C  losartan (COZAAR) 25 MG tablet Take 25 mg by mouth daily. 01/10/17   [provider]  MAGNESIUM PO Take by mouth.    [provider]  metoCLOPramide (REGLAN) 5 MG tablet Take 5 mg by mouth daily.     [provider]  Multiple Vitamins-Minerals (ZINC PO) Take by mouth.    [provider]  nitroGLYCERIN (NITROSTAT) 0.4 MG SL tablet PLACE 1 TABLET UNDER TONGUE EVERY 5 MINUTES AS NEEDED FOR CHEST PAIN (UP TO 3 DOSES) 03/22/16   [name], PWonda Cheng MD  pantoprazole (PROTONIX) 40 MG tablet Take 40 mg by mouth daily. 12/10/16   [provider]  potassium chloride SA (K-DUR,KLOR-CON) 20 MEQ tablet Take 20 mEq by mouth daily.    [provider]  PROAIR HFA 108 (90 Base) MCG/ACT inhaler INHALE 2 PUFFS INTO THE LUNGS EVERY 6 (SIX) HOURS AS NEEDED FOR WHEEZING OR SHORTNESS OF BREATH. 07/08/15   BCollene Gobble MD  promethazine (PHENERGAN) 25 MG tablet Take 25 mg by mouth every 6 (six) hours as needed for nausea.  05/17/14   [provider]  thyroid (ARMOUR) 60 MG tablet Take 60 mg by mouth daily.     [provider]  traMADol (ULTRAM) 50 MG tablet Take 100 mg by mouth 3 (three) times daily.     [provider]  traZODone (DESYREL) 100 MG tablet Take 100 mg by mouth every evening.  04/16/14   [provider]  VITAMIN E PO Take by mouth.    [provider]  warfarin (COUMADIN) 3 MG tablet Take 1/2 tab to 1 tab daily or as directed by [facility] 04/19/17   [name], PWonda Cheng MD     Allergies:    No Known Allergies   Physical Exam:   Vitals  Blood pressure 125/68, pulse (!) 107, temperature (!) 103 F (39.4 C), temperature source Rectal, resp. rate (!) 22, height 5' 10"  (1.778 m), weight 104.3 kg (230 lb), SpO2 93 %.   1. General lying in bed in NAD,    2. Normal affect and insight, Not Suicidal or Homicidal, Awake Alert, Oriented X 3.  3. No F.N deficits, ALL C.Nerves Intact, Strength 5/5 all 4 extremities, Sensation intact all 4 extremities, Plantars down going.  4. Ears and Eyes appear Normal, Conjunctivae clear, PERRLA. Moist Oral Mucosa.  5. Supple Neck, No JVD, No cervical lymphadenopathy appriciated, No Carotid Bruits.  6. Symmetrical Chest wall movement, Good air movement bilaterally, CTAB.  7. RRR, No Gallops, Rubs or Murmurs, No Parasternal Heave.  8. Positive Bowel Sounds, Abdomen Soft, No tenderness, No organomegaly appriciated,No rebound -guarding or rigidity.  9.  No Cyanosis, Normal Skin Turgor, No Skin Rash or Bruise.  10. Good muscle tone,  joints appear normal , no effusions, Normal ROM.  11. No Palpable Lymph Nodes in Neck or Axillae     Data Review:    CBC Recent Labs  Lab 04/22/17 1735  WBC 27.7*  HGB 11.2*  HCT 36.6*  PLT 277  MCV 79.4  MCH 24.3*  MCHC 30.6  RDW 18.2*  LYMPHSABS 1.9  MONOABS 1.9*  EOSABS 0.3  BASOSABS 0.0   ------------------------------------------------------------------------------------------------------------------  Chemistries  Recent Labs  Lab 04/22/17 1735  NA 134*  K 3.5    CL 100*  CO2 23  GLUCOSE 351*  BUN 23*  CREATININE 1.65*  CALCIUM 8.6*  AST 54*  ALT 86*  ALKPHOS 116  BILITOT 0.8   ------------------------------------------------------------------------------------------------------------------ estimated creatinine clearance is 46.1 mL/min (A) (by C-G formula based on SCr of 1.65 mg/dL (H)). ------------------------------------------------------------------------------------------------------------------ Recent Labs    04/22/17 2013  TSH 1.135    Coagulation profile Recent Labs  Lab 04/19/17 0845 04/22/17 1735  INR 1.0 1.31   ------------------------------------------------------------------------------------------------------------------- No results for input(s): DDIMER in the last 72 hours. -------------------------------------------------------------------------------------------------------------------  Cardiac Enzymes No results for input(s): CKMB, TROPONINI, MYOGLOBIN in the last 168 hours.  Invalid input(s): CK ------------------------------------------------------------------------------------------------------------------    Component Value Date/Time   BNP 35.3 04/22/2017 2013   BNP 23.5 06/09/2015 0918     ---------------------------------------------------------------------------------------------------------------  Urinalysis    Component Value Date/Time   COLORURINE YELLOW 04/22/2017 1649   APPEARANCEUR HAZY (A) 04/22/2017 1649   LABSPEC 1.014 04/22/2017 1649   PHURINE 6.0 04/22/2017 1649   GLUCOSEU >=500 (A) 04/22/2017 1649   HGBUR NEGATIVE 04/22/2017 1649   BILIRUBINUR NEGATIVE 04/22/2017 1649   KETONESUR NEGATIVE 04/22/2017 1649   PROTEINUR NEGATIVE 04/22/2017 1649   UROBILINOGEN 0.2 03/21/2007 1015   NITRITE NEGATIVE 04/22/2017 1649   LEUKOCYTESUR NEGATIVE 04/22/2017 1649    ----------------------------------------------------------------------------------------------------------------   Imaging  Results:    Dg Chest 2 View  Result Date: 04/22/2017 CLINICAL DATA:  Fever.  Worsening shortness of breath and weakness. EXAM: CHEST - 2 VIEW COMPARISON:  01/21/2017 FINDINGS: The cardiac silhouette is normal in size. Aortic atherosclerosis is noted. Lung volumes are low with bronchovascular crowding/minimal atelectasis in the lung bases. No lobar consolidation, edema, pleural effusion, pneumothorax is identified. Thoracic spondylosis is noted. IMPRESSION: Low lung volumes without evidence of active cardiopulmonary disease. Electronically Signed   By: Logan Bores M.D.   On: 04/22/2017 18:19   Ct Head Wo Contrast  Result Date: 04/22/2017 CLINICAL DATA:  Generalized weakness. EXAM: CT HEAD WITHOUT CONTRAST TECHNIQUE: Contiguous axial images were obtained from the base of the skull through the vertex without intravenous contrast. COMPARISON:  Head CT 01/21/2017 FINDINGS: Brain: No mass lesion, intraparenchymal hemorrhage or extra-axial collection. No evidence of acute cortical infarct. There is periventricular hypoattenuation compatible with chronic microvascular disease. Vascular: No hyperdense vessel or unexpected vascular calcification. Skull: Normal visualized skull base, calvarium and extracranial soft tissues. Sinuses/Orbits: Moderate right maxillary sinus mucosal thickening. Normal orbits. IMPRESSION: Chronic ischemic microangiopathy without acute intracranial abnormality. Electronically Signed   By: Ulyses Jarred M.D.   On: 04/22/2017 21:54   Ct Angio Chest Pe W Or Wo Contrast  Result Date: 04/22/2017 CLINICAL DATA:  Generalize weakness with shortness of breath EXAM: CT ANGIOGRAPHY CHEST WITH CONTRAST TECHNIQUE: Multidetector CT imaging of the chest was performed using the standard protocol during bolus administration of intravenous contrast. Multiplanar CT image reconstructions and MIPs were obtained to evaluate the vascular  anatomy. CONTRAST:  79m ISOVUE-370 IOPAMIDOL (ISOVUE-370) INJECTION 76%  COMPARISON:  Chest x-ray 04/22/2017, CT chest 06/21/2014 FINDINGS: Cardiovascular: Poor opacification of distal branch vessels limits assessment for emboli. No gross central embolus is seen. Ectatic ascending aorta measuring up to 3.8 cm. Moderate aortic atherosclerosis. Coronary vascular calcification. Normal heart size. No pericardial effusion Mediastinum/Nodes: Midline trachea. No thyroid mass. No significant adenopathy. Esophagus within normal limits. Lungs/Pleura: Lungs are clear. No pleural effusion or pneumothorax. Atelectasis in the bases Upper Abdomen: Steatosis.  Surgical clips in the gallbladder fossa Musculoskeletal: Gynecomastia. Degenerative changes of the spine. No acute or suspicious lesion Review of the MIP images confirms the above findings. IMPRESSION: 1. Limited study secondary to poor contrast opacification of the distal pulmonary branch vessels. No gross acute central embolus is seen. 2. No acute pulmonary infiltrates. 3. Marked steatosis Aortic Atherosclerosis (ICD10-I70.0). Electronically Signed   By: KDonavan FoilM.D.   On: 04/22/2017 21:42       Assessment & Plan:    Principal Problem:   Sepsis ([place] Active Problems:   CKD (chronic kidney disease) stage 3, GFR 30-59 ml/min (HCC)   Anemia   Fever   Diabetes mellitus without complication (HCC)   Fever Influenza panel pending  Sepsis Blood culture x2 vanco iv , zosyn iv pharmacy to dose  Dyspnea secondary to ? Sepsis   Dm2 fsbs ac and qhs, ISS  Anemia Check cbc in am  CKD stage 3 Check cmp in am       DVT Prophylaxis Heparin -  SCDs  AM Labs Ordered, also please review Full Orders  Family Communication: Admission, patients condition and plan of care including tests being ordered have been discussed with the patient  who indicate understanding and agree with the plan and Code Status.  Code Status FULL CODE  Likely DC to  home  Condition GUARDED   Consults called:  none  Admission status:  inpatient  Time spent in minutes : 45   JJani GravelM.D on 04/22/2017 at 10:06 PM  Between 7am to 7pm - Pager - [phone]  After 7pm go to [email or web address] - password [facility] - Office  [phone]

## 2017-04-22 NOTE — ED Notes (Signed)
IV team at bedside 

## 2017-04-22 NOTE — ED Notes (Signed)
[name] (wife) Is going home for the night. She would like to be notified of any changes in the patients status [phone]

## 2017-04-22 NOTE — Progress Notes (Signed)
ANTICOAGULATION CONSULT NOTE - Initial Consult  Pharmacy Consult for Warfarin  Indication: History of VTE  No Known Allergies  Patient Measurements: Height: 5\' 10"  (177.8 cm) Weight: 230 lb (104.3 kg) IBW/kg (Calculated) : 73  Vital Signs: Temp: 98.3 F (36.8 C) (03/29 2302) Temp Source: Oral (03/29 2302) BP: 146/75 (03/29 2300) Pulse Rate: 89 (03/29 2300)  Labs: Recent Labs    04/22/17 1735  HGB 11.2*  HCT 36.6*  PLT 277  LABPROT 16.2*  INR 1.31  CREATININE 1.65*    Estimated Creatinine Clearance: 46.1 mL/min (A) (by C-G formula based on SCr of 1.65 mg/dL (H)).   Medical History: Past Medical History:  Diagnosis Date  . Acute lower GI bleeding    a. admitted to [facility] 04/11/2013;  b. 04/2014 EGD: 1.  gastric erosions, mild prox gastritis and bulbar duodenitis->Protonix.  . Addison's disease ([place])   . Anemia   . Anemia in CKD (chronic kidney disease) 06/25/2015  . Chronic back pain   . Chronic diastolic CHF (congestive heart failure) ([place])    a. His initial ejection fraction was between 30 and 35%;  b. 04/2013 Echo: EF 50-55% Gr1 DD, mild-mod MR;  c. 04/2014 Echo: EF 60-65%, Gr 1 DD, triv TR.  . CKD (chronic kidney disease), stage III ([place])   . Coronary artery disease    a. 07/2014 - s/p difficult PCI - had pressure wire analysis of LAD s/p DES in mid segment c/b wire-induced dissection in the distal segment of the LAD which was treated with repeated balloon inflations. Also had DES to rPDA - again had either spasm distal to the stent placement or an edge dissection but the vessel was small in that area; procedure aborted.  . Daily headache   . DVT (deep venous thrombosis) ([place])    a. 02/2013 superficial thrombophlebitis --> Xarelto later d/c'd 2/2 GIB;  b. 04/2014 Acute Right PT, Peroneal, Popliteal, Femoral, and common femoral DVT-->coumadin  . GERD (gastroesophageal reflux disease)   . H/O hiatal hernia   . Hepatitis    a. 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  . History of aortic  insufficiency   . Hx of cardiovascular stress test    a. ETT-Myoview 7/14:  Low risk, inferior defect consistent with thinning, no ischemia, normal wall motion, EF 54%  . Hypertension   . Hypogonadism male   . Obesity   . On home oxygen therapy    a. added 04/2014 in setting of PE.  Marland Kitchen Peptic ulcer disease   . Pulmonary embolism ([place])    a. 04/2014 CTA: Bilat submassive PE ->coumadin.  . Pulmonary HTN ([place])   . Thyroid disease     Assessment: 77 y/o on warfarin PTA for hx VTE, INR is sub-therapeutic at 1.31 on admit, Hgb 11.2, noted renal dysfunction.   Warfarin PTA dosing: 1.5 on Sun/Thurs, 3 mg all other days  Goal of Therapy:  INR 2-3 Monitor platelets by anticoagulation protocol: Yes   Plan:  Warfarin 5 mg PO x 1 now Daily PT/INR Monitor for bleeding   [name] 04/22/2017,11:34 PM

## 2017-04-22 NOTE — ED Notes (Signed)
Requested Cooling blanket from [employer].

## 2017-04-22 NOTE — ED Triage Notes (Signed)
Pt arrives from home [facility] called by wife, usually has SOB but is worse than normal today, for a couple of hours Generalized weakness approx 3-4 days, unable to get out of bed today and increased frequency of urination today and yesterday CBG 481 but no hx of diabetes  VS: 170/100; HR 120; 95% on 4L 02, RR 34-36 20 LHand
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
## 2017-04-22 NOTE — ED Notes (Signed)
Pt. To CT via stretcher. 

## 2017-04-22 NOTE — ED Notes (Signed)
This RN attempted IV x2  

## 2017-04-22 NOTE — Progress Notes (Signed)
Pharmacy Antibiotic Note  [name] is a 77 y.o. male admitted on 04/22/2017 with sepsis.   Plan: Zosyn 3.375 gm iv q8h Vanc 1 g x 1 then 750 mg q12h Monitor renal fx cx vt prn  Height: 5\' 10"  (177.8 cm) Weight: 230 lb (104.3 kg) IBW/kg (Calculated) : 73  Temp (24hrs), Avg:103.8 F (39.9 C), Min:102.9 F (39.4 C), Max:105 F (40.6 C)  Recent Labs  Lab 04/22/17 1735 04/22/17 1751  WBC 27.7*  --   CREATININE 1.65*  --   LATICACIDVEN  --  2.86*    Estimated Creatinine Clearance: 46.1 mL/min (A) (by C-G formula based on SCr of 1.65 mg/dL (H)).    No Known Allergies  Levester Fresh, PharmD, BCPS, BCCCP Clinical Pharmacist Clinical phone for 04/22/2017 from 1430 [phone]: [phone] If after 2300, please call main pharmacy at: [phone] 04/22/2017 7:20 PM

## 2017-04-23 ENCOUNTER — Inpatient Hospital Stay (HOSPITAL_COMMUNITY): Payer: Medicare HMO

## 2017-04-23 DIAGNOSIS — A419 Sepsis, unspecified organism: Secondary | ICD-10-CM

## 2017-04-23 DIAGNOSIS — N183 Chronic kidney disease, stage 3 (moderate): Secondary | ICD-10-CM

## 2017-04-23 DIAGNOSIS — E119 Type 2 diabetes mellitus without complications: Secondary | ICD-10-CM

## 2017-04-23 LAB — CBC
HCT: 35.4 % — ABNORMAL LOW (ref 39.0–52.0)
HEMOGLOBIN: 11 g/dL — AB (ref 13.0–17.0)
MCH: 24.6 pg — AB (ref 26.0–34.0)
MCHC: 31.1 g/dL (ref 30.0–36.0)
MCV: 79 fL (ref 78.0–100.0)
Platelets: 257 10*3/uL (ref 150–400)
RBC: 4.48 MIL/uL (ref 4.22–5.81)
RDW: 18.5 % — ABNORMAL HIGH (ref 11.5–15.5)
WBC: 32 10*3/uL — ABNORMAL HIGH (ref 4.0–10.5)

## 2017-04-23 LAB — PROTIME-INR
INR: 1.33
PROTHROMBIN TIME: 16.4 s — AB (ref 11.4–15.2)

## 2017-04-23 LAB — COMPREHENSIVE METABOLIC PANEL
ALT: 83 U/L — ABNORMAL HIGH (ref 17–63)
ANION GAP: 13 (ref 5–15)
AST: 60 U/L — ABNORMAL HIGH (ref 15–41)
Albumin: 2.8 g/dL — ABNORMAL LOW (ref 3.5–5.0)
Alkaline Phosphatase: 99 U/L (ref 38–126)
BUN: 22 mg/dL — ABNORMAL HIGH (ref 6–20)
CHLORIDE: 100 mmol/L — AB (ref 101–111)
CO2: 19 mmol/L — AB (ref 22–32)
Calcium: 7.9 mg/dL — ABNORMAL LOW (ref 8.9–10.3)
Creatinine, Ser: 1.82 mg/dL — ABNORMAL HIGH (ref 0.61–1.24)
GFR calc non Af Amer: 34 mL/min — ABNORMAL LOW (ref >60)
GFR, EST AFRICAN AMERICAN: 40 mL/min — AB (ref >60)
Glucose, Bld: 459 mg/dL — ABNORMAL HIGH (ref 65–99)
POTASSIUM: 4.3 mmol/L (ref 3.5–5.1)
SODIUM: 132 mmol/L — AB (ref 135–145)
Total Bilirubin: 0.7 mg/dL (ref 0.3–1.2)
Total Protein: 6 g/dL — ABNORMAL LOW (ref 6.5–8.1)

## 2017-04-23 LAB — TROPONIN I
Troponin I: <0.03 ng/mL (ref <0.03)
Troponin I: <0.03 ng/mL (ref <0.03)
Troponin I: <0.03 ng/mL (ref <0.03)

## 2017-04-23 LAB — CBG MONITORING, ED
GLUCOSE-CAPILLARY: 395 mg/dL — AB (ref 65–99)
Glucose-Capillary: 447 mg/dL — ABNORMAL HIGH (ref 65–99)
Glucose-Capillary: 467 mg/dL — ABNORMAL HIGH (ref 65–99)
Glucose-Capillary: 257 mg/dL — ABNORMAL HIGH (ref 65–99)
Glucose-Capillary: 315 mg/dL — ABNORMAL HIGH (ref 65–99)

## 2017-04-23 LAB — GLUCOSE, CAPILLARY
GLUCOSE-CAPILLARY: 200 mg/dL — AB (ref 65–99)
GLUCOSE-CAPILLARY: 178 mg/dL — AB (ref 65–99)

## 2017-04-23 MED ORDER — INSULIN ASPART 100 UNIT/ML ~~LOC~~ SOLN
0.0000 [IU] | Freq: Three times a day (TID) | SUBCUTANEOUS | Status: DC
  Administered 2017-04-23: 7 [IU] via SUBCUTANEOUS
  Administered 2017-04-23: 9 [IU] via SUBCUTANEOUS
  Administered 2017-04-23 – 2017-04-24 (×2): 5 [IU] via SUBCUTANEOUS
  Administered 2017-04-24 (×2): 3 [IU] via SUBCUTANEOUS
  Administered 2017-04-25 (×2): 2 [IU] via SUBCUTANEOUS
  Filled 2017-04-23 (×3): qty 1

## 2017-04-23 MED ORDER — PANTOPRAZOLE SODIUM 40 MG PO TBEC
40.0000 mg | DELAYED_RELEASE_TABLET | Freq: Every day | ORAL | Status: DC
  Administered 2017-04-23 – 2017-04-25 (×3): 40 mg via ORAL
  Filled 2017-04-23 (×3): qty 1

## 2017-04-23 MED ORDER — ACETAMINOPHEN 650 MG RE SUPP: 650.0000 mg | Freq: Four times a day (QID) | RECTAL | Status: DC | PRN

## 2017-04-23 MED ORDER — FERROUS SULFATE 325 (65 FE) MG PO TABS
325.0000 mg | ORAL_TABLET | Freq: Every day | ORAL | Status: DC
  Administered 2017-04-24 – 2017-04-25 (×2): 325 mg via ORAL
  Filled 2017-04-23 (×5): qty 1

## 2017-04-23 MED ORDER — WARFARIN SODIUM 3 MG PO TABS
3.0000 mg | ORAL_TABLET | Freq: Once | ORAL | Status: DC
  Filled 2017-04-23: qty 1

## 2017-04-23 MED ORDER — THYROID 60 MG PO TABS
60.0000 mg | ORAL_TABLET | Freq: Every day | ORAL | Status: DC
  Administered 2017-04-24 – 2017-04-25 (×2): 60 mg via ORAL
  Filled 2017-04-23 (×4): qty 1

## 2017-04-23 MED ORDER — ACETAMINOPHEN 325 MG PO TABS: 650.0000 mg | ORAL_TABLET | Freq: Four times a day (QID) | ORAL | Status: DC | PRN

## 2017-04-23 MED ORDER — LOSARTAN POTASSIUM 25 MG PO TABS
25.0000 mg | ORAL_TABLET | Freq: Every day | ORAL | Status: DC
  Administered 2017-04-23 – 2017-04-25 (×3): 25 mg via ORAL
  Filled 2017-04-23 (×3): qty 1

## 2017-04-23 MED ORDER — SODIUM CHLORIDE 0.9 % IV SOLN: 250.0000 mL | INTRAVENOUS | Status: DC | PRN

## 2017-04-23 MED ORDER — HYDROCORTISONE 20 MG PO TABS: 20.0000 mg | ORAL_TABLET | Freq: Three times a day (TID) | ORAL | Status: DC

## 2017-04-23 MED ORDER — INSULIN ASPART 100 UNIT/ML ~~LOC~~ SOLN
0.0000 [IU] | Freq: Every day | SUBCUTANEOUS | Status: DC
  Administered 2017-04-24: 2 [IU] via SUBCUTANEOUS

## 2017-04-23 MED ORDER — ALBUTEROL SULFATE (2.5 MG/3ML) 0.083% IN NEBU
2.5000 mg | INHALATION_SOLUTION | Freq: Four times a day (QID) | RESPIRATORY_TRACT | Status: DC | PRN
  Administered 2017-04-23: 2.5 mg via RESPIRATORY_TRACT
  Filled 2017-04-23: qty 3

## 2017-04-23 MED ORDER — CARVEDILOL 6.25 MG PO TABS
6.2500 mg | ORAL_TABLET | Freq: Two times a day (BID) | ORAL | Status: DC
  Administered 2017-04-23 – 2017-04-25 (×5): 6.25 mg via ORAL
  Filled 2017-04-23 (×5): qty 1

## 2017-04-23 MED ORDER — POTASSIUM CHLORIDE CRYS ER 20 MEQ PO TBCR
20.0000 meq | EXTENDED_RELEASE_TABLET | Freq: Every day | ORAL | Status: DC
  Administered 2017-04-23 – 2017-04-25 (×3): 20 meq via ORAL
  Filled 2017-04-23 (×3): qty 1

## 2017-04-23 MED ORDER — TRAZODONE HCL 100 MG PO TABS
100.0000 mg | ORAL_TABLET | Freq: Every evening | ORAL | Status: DC
  Administered 2017-04-23: 100 mg via ORAL
  Filled 2017-04-23: qty 1

## 2017-04-23 MED ORDER — TRAMADOL HCL 50 MG PO TABS
100.0000 mg | ORAL_TABLET | Freq: Three times a day (TID) | ORAL | Status: DC
  Administered 2017-04-23 – 2017-04-25 (×7): 100 mg via ORAL
  Filled 2017-04-23 (×8): qty 2

## 2017-04-23 MED ORDER — SODIUM CHLORIDE 0.9% FLUSH: 3.0000 mL | INTRAVENOUS | Status: DC | PRN

## 2017-04-23 MED ORDER — SODIUM CHLORIDE 0.9% FLUSH
3.0000 mL | Freq: Two times a day (BID) | INTRAVENOUS | Status: DC
  Administered 2017-04-23 – 2017-04-24 (×4): 3 mL via INTRAVENOUS

## 2017-04-23 MED ORDER — NITROGLYCERIN 0.4 MG SL SUBL: 0.4000 mg | SUBLINGUAL_TABLET | SUBLINGUAL | Status: DC | PRN

## 2017-04-23 MED ORDER — PROMETHAZINE HCL 25 MG PO TABS: 25.0000 mg | ORAL_TABLET | Freq: Four times a day (QID) | ORAL | Status: DC | PRN

## 2017-04-23 MED ORDER — SODIUM CHLORIDE 0.9 % IV SOLN
1250.0000 mg | INTRAVENOUS | Status: DC
Start: 2017-04-23 — End: 2017-04-25
  Administered 2017-04-23 – 2017-04-24 (×2): 1250 mg via INTRAVENOUS
  Filled 2017-04-23 (×4): qty 1250

## 2017-04-23 MED ORDER — INSULIN GLARGINE 100 UNIT/ML ~~LOC~~ SOLN
20.0000 [IU] | Freq: Every day | SUBCUTANEOUS | Status: DC
  Administered 2017-04-23 – 2017-04-24 (×2): 20 [IU] via SUBCUTANEOUS
  Filled 2017-04-23 (×3): qty 0.2

## 2017-04-23 MED ORDER — METOCLOPRAMIDE HCL 5 MG PO TABS
5.0000 mg | ORAL_TABLET | Freq: Every day | ORAL | Status: DC
  Administered 2017-04-23: 5 mg via ORAL
  Filled 2017-04-23: qty 1

## 2017-04-23 MED ORDER — GABAPENTIN 600 MG PO TABS
600.0000 mg | ORAL_TABLET | Freq: Every day | ORAL | Status: DC
  Administered 2017-04-23 – 2017-04-25 (×3): 600 mg via ORAL
  Filled 2017-04-23 (×3): qty 1

## 2017-04-23 MED ORDER — HYDROCORTISONE 20 MG PO TABS
20.0000 mg | ORAL_TABLET | Freq: Two times a day (BID) | ORAL | Status: DC
  Administered 2017-04-23 – 2017-04-25 (×4): 20 mg via ORAL
  Filled 2017-04-23 (×7): qty 1

## 2017-04-23 MED ORDER — ISOSORBIDE MONONITRATE ER 30 MG PO TB24
15.0000 mg | ORAL_TABLET | Freq: Every day | ORAL | Status: DC
  Administered 2017-04-23 – 2017-04-25 (×3): 15 mg via ORAL
  Filled 2017-04-23 (×3): qty 1

## 2017-04-23 MED ORDER — FUROSEMIDE 20 MG PO TABS
20.0000 mg | ORAL_TABLET | Freq: Every day | ORAL | Status: DC
Start: 2017-04-23 — End: 2017-04-25
  Administered 2017-04-23 – 2017-04-25 (×3): 20 mg via ORAL
  Filled 2017-04-23 (×3): qty 1

## 2017-04-23 MED ORDER — INSULIN ASPART 100 UNIT/ML ~~LOC~~ SOLN
7.0000 [IU] | Freq: Once | SUBCUTANEOUS | Status: AC
  Administered 2017-04-23: 7 [IU] via SUBCUTANEOUS
  Filled 2017-04-23: qty 1

## 2017-04-23 MED ORDER — HYDROCORTISONE 10 MG PO TABS
10.0000 mg | ORAL_TABLET | Freq: Every day | ORAL | Status: DC
  Administered 2017-04-24 – 2017-04-25 (×2): 10 mg via ORAL
  Filled 2017-04-23 (×4): qty 1

## 2017-04-23 MED ORDER — ALBUTEROL SULFATE HFA 108 (90 BASE) MCG/ACT IN AERS: 2.0000 | INHALATION_SPRAY | Freq: Four times a day (QID) | RESPIRATORY_TRACT | Status: DC | PRN

## 2017-04-23 NOTE — ED Notes (Signed)
Pt eating at this time.

## 2017-04-23 NOTE — ED Notes (Signed)
Pt mouth breathing while asleep causing SpO2 to drop into the 80s on 4L [place]. Simple face mask applied 4L on patient. SpO2 93% at this time. Will continue to monitor.

## 2017-04-23 NOTE — Progress Notes (Signed)
ANTICOAGULATION CONSULT NOTE - Initial Consult  Pharmacy Consult for Warfarin  Indication: History of VTE  No Known Allergies  Patient Measurements: Height: 5\' 10"  (177.8 cm) Weight: 230 lb (104.3 kg) IBW/kg (Calculated) : 73  Vital Signs: Temp: 98.3 F (36.8 C) (03/30 0145) Temp Source: Oral (03/30 0145) BP: 119/58 (03/30 0826) Pulse Rate: 84 (03/30 0826)  Labs: Recent Labs    04/22/17 1735 04/23/17 0146  HGB 11.2* 11.0*  HCT 36.6* 35.4*  PLT 277 257  LABPROT 16.2* 16.4*  INR 1.31 1.33  CREATININE 1.65* 1.82*  TROPONINI  --  <0.03    Estimated Creatinine Clearance: 41.8 mL/min (A) (by C-G formula based on SCr of 1.82 mg/dL (H)).   Medical History: Past Medical History:  Diagnosis Date  . Acute lower GI bleeding    a. admitted to [facility] 04/11/2013;  b. 04/2014 EGD: 1.  gastric erosions, mild prox gastritis and bulbar duodenitis->Protonix.  . Addison's disease ([place])   . Anemia   . Anemia in CKD (chronic kidney disease) 06/25/2015  . Chronic back pain   . Chronic diastolic CHF (congestive heart failure) ([place])    a. His initial ejection fraction was between 30 and 35%;  b. 04/2013 Echo: EF 50-55% Gr1 DD, mild-mod MR;  c. 04/2014 Echo: EF 60-65%, Gr 1 DD, triv TR.  . CKD (chronic kidney disease), stage III ([place])   . Coronary artery disease    a. 07/2014 - s/p difficult PCI - had pressure wire analysis of LAD s/p DES in mid segment c/b wire-induced dissection in the distal segment of the LAD which was treated with repeated balloon inflations. Also had DES to rPDA - again had either spasm distal to the stent placement or an edge dissection but the vessel was small in that area; procedure aborted.  . Daily headache   . DVT (deep venous thrombosis) ([place])    a. 02/2013 superficial thrombophlebitis --> Xarelto later d/c'd 2/2 GIB;  b. 04/2014 Acute Right PT, Peroneal, Popliteal, Femoral, and common femoral DVT-->coumadin  . GERD (gastroesophageal reflux disease)   . H/O hiatal hernia    . Hepatitis    a. 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  . History of aortic insufficiency   . Hx of cardiovascular stress test    a. ETT-Myoview 7/14:  Low risk, inferior defect consistent with thinning, no ischemia, normal wall motion, EF 54%  . Hypertension   . Hypogonadism male   . Obesity   . On home oxygen therapy    a. added 04/2014 in setting of PE.  Marland Kitchen Peptic ulcer disease   . Pulmonary embolism ([place])    a. 04/2014 CTA: Bilat submassive PE ->coumadin.  . Pulmonary HTN ([place])   . Thyroid disease     Assessment: 77 y/o on Coumadin 3mg  daily exc 1.5mg  on Thurs/Sun PTA for hx VTE. Admit INR 1.33, Hgb 11, plts wnl.  Goal of Therapy:  INR 2-3 Monitor platelets by anticoagulation protocol: Yes   Plan:  Give Coumadin 3mg  PO x 1 tonight Monitor daily INR, CBC, s/s of bleed   Ivory Bail J 04/23/2017,8:56 AM

## 2017-04-23 NOTE — ED Notes (Signed)
Heart healthy/ carb modified breakfast tray ordered  

## 2017-04-23 NOTE — ED Notes (Signed)
Lunch tray ordered 

## 2017-04-23 NOTE — ED Notes (Signed)
Dinner tray ordered for pt

## 2017-04-23 NOTE — ED Notes (Addendum)
Paged floor coverage provider concerning CBG of 467 and lab value of 459.  Dose of 7 units subcutaneous ordered.  Patient remains in ultrasound at this time.  Will administer upon return to the department.  Patient remains alert and oriented with no decline in mental status.  Still remains weak at this time.    Vitals:   04/23/17 0245 04/23/17 0315  BP: 139/70 (!) 141/81  Pulse: 81 84  Resp: 18 18  Temp:    SpO2: 97% 97%     Aaron Edelman RN 04/23/17

## 2017-04-23 NOTE — Progress Notes (Signed)
Pharmacy Antibiotic Note  [name] is a 77 y.o. male admitted on 04/22/2017 with sepsis.  Pharmacy has been consulted for vancomycin and zosyn dosing.  Normalized CrCl ~2ml/min  Plan: Continue Zosyn 3.375 gm IV q8h Decrease vancomycin to 1,250mg  IV q24h Monitor clinical picture, renal function, VT prn F/U C&S, abx deescalation / LOT  Height: 5\' 10"  (177.8 cm) Weight: 230 lb (104.3 kg) IBW/kg (Calculated) : 73  Temp (24hrs), Avg:101.8 F (38.8 C), Min:98.3 F (36.8 C), Max:105 F (40.6 C)  Recent Labs  Lab 04/22/17 1735 04/22/17 1751 04/22/17 1940 04/22/17 2013 04/23/17 0146  WBC 27.7*  --   --   --  32.0*  CREATININE 1.65*  --   --   --  1.82*  LATICACIDVEN  --  2.86* 3.38* 2.7*  --     Estimated Creatinine Clearance: 41.8 mL/min (A) (by C-G formula based on SCr of 1.82 mg/dL (H)).    No Known Allergies  Antimicrobials this admission: Zosyn 3/29 >> Vanc 3/29 >>  Dose adjustments this admission: n/a  Microbiology results: 3/29 BCx: sent Flu negative  Thank you for allowing pharmacy to be a part of this patient's care.  Reginia Naas 04/23/2017 8:54 AM

## 2017-04-23 NOTE — Progress Notes (Signed)
Patient ID: [name], male   DOB: 20-Dec-1940, 77 y.o.   MRN: [id number]  PROGRESS NOTE    ELDRIGE PITKIN  ERX:[id number] DOB: 1940-12-07 DOA: 04/22/2017 PCP: Lawerance Cruel, MD   Outpatient Specialists:   Brief Narrative: Patient is a 77 y.o. male, w Addisons disease, Anemia, CHF, CAD, CKD stage 3, DVT, PE, apparently c/o dyspnea. Pt noted subjective fever.  Denies cough, cp, palp, n/v, diarrhea, brbpr, dysuria.  In the ER he had Septic features with no obvious source.    Assessment & Plan:   Principal Problem:   Sepsis ([place]) Active Problems:   CKD (chronic kidney disease) stage 3, GFR 30-59 ml/min (HCC)   Anemia   Fever   Diabetes mellitus without complication ([place])   #1 Sepsis: Source unclear. Will continue empiric antibiotics. Cultural sensitivities currently pending. On Zosyn and vancomycin.  #2 diabetes: Blood sugar is uncontrolled.  Continue management. On sliding scale insulin.  We'll add Lantus due to high blood sugar  #3 anemia of chronic disease: monitor H&H closely.  #4 chronic kidney disease stage III: renal function is at baseline.  #5 leukocytosis: White count is more  than 30K Continue antibiotics and monitor   DVT prophylaxis: warfarin  Code Status: Full  Family Communication: None available  Disposition Plan: Home   Consultants:   None  Procedures:   Antimicrobials: vancomycin and Zosyn  Subjective: Patient is currently stable but weak.  No fever at this point.  No nausea vomiting or diarrhea.  Generalized malaise reported.  Objective: Vitals:   04/23/17 1052 04/23/17 1116 04/23/17 1119 04/23/17 1145  BP: 103/70 109/76  107/82  Pulse: 91 83  84  Resp: 20 19  20   Temp:   97.8 F (36.6 C)   TempSrc:   Oral   SpO2: 93% 95%  96%  Weight:      Height:        Intake/Output Summary (Last 24 hours) at 04/23/2017 1501 Last data filed at 04/23/2017 0827 Gross per 24 hour  Intake 2619.65 ml  Output 150 ml  Net 2469.65 ml    Filed Weights   04/22/17 1640  Weight: 104.3 kg (230 lb)    Examination:  General exam: Appears calm and comfortable  Respiratory system: Clear to auscultation. Respiratory effort normal. Cardiovascular system: S1 & S2 heard, RRR. No JVD, murmurs, rubs, gallops or clicks. No pedal edema. Gastrointestinal system: Abdomen is nondistended, soft and nontender. No organomegaly or masses felt. Normal bowel sounds heard. Central nervous system: Alert and oriented. No focal neurological deficits. Extremities: Symmetric 5 x 5 power. Skin: No rashes, lesions or ulcers Psychiatry: Judgement and insight appear normal. Mood & affect appropriate.     Data Reviewed: I have personally reviewed following labs and imaging studies  CBC: Recent Labs  Lab 04/22/17 1735 04/23/17 0146  WBC 27.7* 32.0*  NEUTROABS 23.6*  --   HGB 11.2* 11.0*  HCT 36.6* 35.4*  MCV 79.4 79.0  PLT 277 950   Basic Metabolic Panel: Recent Labs  Lab 04/22/17 1735 04/23/17 0146  NA 134* 132*  K 3.5 4.3  CL 100* 100*  CO2 23 19*  GLUCOSE 351* 459*  BUN 23* 22*  CREATININE 1.65* 1.82*  CALCIUM 8.6* 7.9*   GFR: Estimated Creatinine Clearance: 41.8 mL/min (A) (by C-G formula based on SCr of 1.82 mg/dL (H)). Liver Function Tests: Recent Labs  Lab 04/22/17 1735 04/23/17 0146  AST 54* 60*  ALT 86* 83*  ALKPHOS 116 99  BILITOT  0.8 0.7  PROT 6.6 6.0*  ALBUMIN 3.0* 2.8*   No results for input(s): LIPASE, AMYLASE in the last 168 hours. No results for input(s): AMMONIA in the last 168 hours. Coagulation Profile: Recent Labs  Lab 04/19/17 0845 04/22/17 1735 04/23/17 0146  INR 1.0 1.31 1.33   Cardiac Enzymes: Recent Labs  Lab 04/23/17 0146 04/23/17 0803  TROPONINI <0.03 <0.03   BNP (last 3 results) Recent Labs    05/19/16 1500  PROBNP 40   HbA1C: No results for input(s): HGBA1C in the last 72 hours. CBG: Recent Labs  Lab 04/22/17 1643 04/23/17 0137 04/23/17 0149 04/23/17 0811  04/23/17 1216  GLUCAP 388* 447* 467* 395* 257*   Lipid Profile: No results for input(s): CHOL, HDL, LDLCALC, TRIG, CHOLHDL, LDLDIRECT in the last 72 hours. Thyroid Function Tests: Recent Labs    04/22/17 2013  TSH 1.135  FREET4 0.72   Anemia Panel: No results for input(s): VITAMINB12, FOLATE, FERRITIN, TIBC, IRON, RETICCTPCT in the last 72 hours. Urine analysis:    Component Value Date/Time   COLORURINE YELLOW 04/22/2017 1649   APPEARANCEUR HAZY (A) 04/22/2017 1649   LABSPEC 1.014 04/22/2017 1649   PHURINE 6.0 04/22/2017 1649   GLUCOSEU >=500 (A) 04/22/2017 1649   HGBUR NEGATIVE 04/22/2017 1649   BILIRUBINUR NEGATIVE 04/22/2017 1649   KETONESUR NEGATIVE 04/22/2017 1649   PROTEINUR NEGATIVE 04/22/2017 1649   UROBILINOGEN 0.2 03/21/2007 1015   NITRITE NEGATIVE 04/22/2017 1649   LEUKOCYTESUR NEGATIVE 04/22/2017 1649   Sepsis Labs: @LABRCNTIP (procalcitonin:4,lacticidven:4)  ) Recent Results (from the past 240 hour(s))  Culture, blood (Routine x 2)     Status: None (Preliminary result)   Collection Time: 04/22/17  4:57 PM  Result Value Ref Range Status   Specimen Description BLOOD LEFT ANTECUBITAL  Final   Special Requests IN PEDIATRIC BOTTLE Blood Culture adequate volume  Final   Culture   Final    NO GROWTH < 24 HOURS Performed at [facility], [place] [address], [place], [place] [address]    Report Status PENDING  Incomplete  Culture, blood (Routine x 2)     Status: None (Preliminary result)   Collection Time: 04/22/17  5:35 PM  Result Value Ref Range Status   Specimen Description BLOOD RIGHT ANTECUBITAL  Final   Special Requests   Final    BOTTLES DRAWN AEROBIC AND ANAEROBIC Blood Culture adequate volume   Culture   Final    NO GROWTH < 24 HOURS Performed at [facility], [place] [address], [place], [place] [address]    Report Status PENDING  Incomplete         Radiology Studies: Dg Chest 2 View  Result Date: 04/22/2017 CLINICAL DATA:  Fever.   Worsening shortness of breath and weakness. EXAM: CHEST - 2 VIEW COMPARISON:  01/21/2017 FINDINGS: The cardiac silhouette is normal in size. Aortic atherosclerosis is noted. Lung volumes are low with bronchovascular crowding/minimal atelectasis in the lung bases. No lobar consolidation, edema, pleural effusion, pneumothorax is identified. Thoracic spondylosis is noted. IMPRESSION: Low lung volumes without evidence of active cardiopulmonary disease. Electronically Signed   By: Logan Bores M.D.   On: 04/22/2017 18:19   Ct Head Wo Contrast  Result Date: 04/22/2017 CLINICAL DATA:  Generalized weakness. EXAM: CT HEAD WITHOUT CONTRAST TECHNIQUE: Contiguous axial images were obtained from the base of the skull through the vertex without intravenous contrast. COMPARISON:  Head CT 01/21/2017 FINDINGS: Brain: No mass lesion, intraparenchymal hemorrhage or extra-axial collection. No evidence of acute  cortical infarct. There is periventricular hypoattenuation compatible with chronic microvascular disease. Vascular: No hyperdense vessel or unexpected vascular calcification. Skull: Normal visualized skull base, calvarium and extracranial soft tissues. Sinuses/Orbits: Moderate right maxillary sinus mucosal thickening. Normal orbits. IMPRESSION: Chronic ischemic microangiopathy without acute intracranial abnormality. Electronically Signed   By: Ulyses Jarred M.D.   On: 04/22/2017 21:54   Ct Angio Chest Pe W Or Wo Contrast  Result Date: 04/22/2017 CLINICAL DATA:  Generalize weakness with shortness of breath EXAM: CT ANGIOGRAPHY CHEST WITH CONTRAST TECHNIQUE: Multidetector CT imaging of the chest was performed using the standard protocol during bolus administration of intravenous contrast. Multiplanar CT image reconstructions and MIPs were obtained to evaluate the vascular anatomy. CONTRAST:  42mL ISOVUE-370 IOPAMIDOL (ISOVUE-370) INJECTION 76% COMPARISON:  Chest x-ray 04/22/2017, CT chest 06/21/2014 FINDINGS:  Cardiovascular: Poor opacification of distal branch vessels limits assessment for emboli. No gross central embolus is seen. Ectatic ascending aorta measuring up to 3.8 cm. Moderate aortic atherosclerosis. Coronary vascular calcification. Normal heart size. No pericardial effusion Mediastinum/Nodes: Midline trachea. No thyroid mass. No significant adenopathy. Esophagus within normal limits. Lungs/Pleura: Lungs are clear. No pleural effusion or pneumothorax. Atelectasis in the bases Upper Abdomen: Steatosis.  Surgical clips in the gallbladder fossa Musculoskeletal: Gynecomastia. Degenerative changes of the spine. No acute or suspicious lesion Review of the MIP images confirms the above findings. IMPRESSION: 1. Limited study secondary to poor contrast opacification of the distal pulmonary branch vessels. No gross acute central embolus is seen. 2. No acute pulmonary infiltrates. 3. Marked steatosis Aortic Atherosclerosis (ICD10-I70.0). Electronically Signed   By: Donavan Foil M.D.   On: 04/22/2017 21:42   US Abdomen Limited Ruq  Result Date: 04/23/2017 CLINICAL DATA:  77 year old male with abnormal liver function. History of cholecystectomy. EXAM: ULTRASOUND ABDOMEN LIMITED RIGHT UPPER QUADRANT COMPARISON:  CT of the abdomen pelvis dated 03/20/2012 FINDINGS: Evaluation is limited due to patient's body habitus. Gallbladder: Cholecystectomy. Common bile duct: Diameter: 5 mm Liver: Diffuse increased liver echogenicity most consistent with fatty infiltration. Portal vein is patent on color Doppler imaging with normal direction of blood flow towards the liver. A 6.2 x 5.3 cm right renal upper pole cyst. IMPRESSION: 1. Status post prior cholecystectomy. 2. Findings of fatty liver. 3. Patent main portal vein with hepatopetal flow. Electronically Signed   By: Anner Crete M.D.   On: 04/23/2017 04:16        Scheduled Meds: . carvedilol  6.25 mg Oral BID WC  . ferrous sulfate  325 mg Oral Q breakfast  .  furosemide  20 mg Oral Daily  . gabapentin  600 mg Oral Daily  . hydrocortisone  20 mg Oral BID WC   And  . hydrocortisone  10 mg Oral Q1200  . insulin aspart  0-5 Units Subcutaneous QHS  . insulin aspart  0-9 Units Subcutaneous TID WC  . isosorbide mononitrate  15 mg Oral Daily  . losartan  25 mg Oral Daily  . metoCLOPramide  5 mg Oral Daily  . pantoprazole  40 mg Oral Daily  . potassium chloride SA  20 mEq Oral Daily  . sodium chloride flush  3 mL Intravenous Q12H  . thyroid  60 mg Oral Daily  . traMADol  100 mg Oral TID  . traZODone  100 mg Oral QPM  . warfarin  3 mg Oral ONCE-1800  . Warfarin - Pharmacist Dosing Inpatient   Does not apply q1800   Continuous Infusions: . sodium chloride    . piperacillin-tazobactam (ZOSYN)  IV 3.375 g (04/23/17 1049)  . vancomycin       LOS: 1 day    Time spent: 18 minutes    Toshiye Kever,LAWAL, MD [facility] Pager [phone]  If 7PM-7AM, please contact night-coverage [email or web address] Password [email or web address] 04/23/2017, 3:01 PM

## 2017-04-23 NOTE — ED Notes (Signed)
[name] sats noted to be 88% - O2 applied at 2L/min via n/c - sats 93%. Pt denies any c/o shortness of breath. Pt given urinal as requested.

## 2017-04-23 NOTE — ED Notes (Signed)
SWOT RN to transport pt. Report called.

## 2017-04-24 ENCOUNTER — Inpatient Hospital Stay (HOSPITAL_COMMUNITY): Payer: Medicare HMO

## 2017-04-24 DIAGNOSIS — I5030 Unspecified diastolic (congestive) heart failure: Secondary | ICD-10-CM

## 2017-04-24 LAB — CBC WITH DIFFERENTIAL/PLATELET
BASOS PCT: 0 %
Basophils Absolute: 0 10*3/uL (ref 0.0–0.1)
EOS ABS: 0.5 10*3/uL (ref 0.0–0.7)
Eosinophils Relative: 3 %
HCT: 36 % — ABNORMAL LOW (ref 39.0–52.0)
HEMOGLOBIN: 10.8 g/dL — AB (ref 13.0–17.0)
Lymphocytes Relative: 20 %
Lymphs Abs: 3.7 10*3/uL (ref 0.7–4.0)
MCH: 23.9 pg — ABNORMAL LOW (ref 26.0–34.0)
MCHC: 30 g/dL (ref 30.0–36.0)
MCV: 79.8 fL (ref 78.0–100.0)
Monocytes Absolute: 1.6 10*3/uL (ref 0.1–1.0)
Monocytes Relative: 9 %
Neutro Abs: 13 10*3/uL (ref 1.7–7.7)
Neutrophils Relative %: 68 %
Platelets: 288 10*3/uL (ref 150–400)
RBC: 4.51 MIL/uL (ref 4.22–5.81)
RDW: 19 % — ABNORMAL HIGH (ref 11.5–15.5)
WBC: 18.9 10*3/uL — AB (ref 4.0–10.5)

## 2017-04-24 LAB — COMPREHENSIVE METABOLIC PANEL
ALBUMIN: 2.9 g/dL — AB (ref 3.5–5.0)
ALK PHOS: 89 U/L (ref 38–126)
ALT: 70 U/L — AB (ref 17–63)
AST: 40 U/L (ref 15–41)
Anion gap: 8 (ref 5–15)
BUN: 31 mg/dL — ABNORMAL HIGH (ref 6–20)
CALCIUM: 8.2 mg/dL — AB (ref 8.9–10.3)
CHLORIDE: 103 mmol/L (ref 101–111)
CO2: 21 mmol/L — AB (ref 22–32)
CREATININE: 2.03 mg/dL — AB (ref 0.61–1.24)
GFR calc Af Amer: 35 mL/min — ABNORMAL LOW (ref >60)
GFR calc non Af Amer: 30 mL/min — ABNORMAL LOW (ref >60)
GLUCOSE: 237 mg/dL — AB (ref 65–99)
Potassium: 3.6 mmol/L (ref 3.5–5.1)
SODIUM: 132 mmol/L — AB (ref 135–145)
Total Bilirubin: 0.5 mg/dL (ref 0.3–1.2)
Total Protein: 6.4 g/dL — ABNORMAL LOW (ref 6.5–8.1)

## 2017-04-24 LAB — MRSA PCR SCREENING: MRSA by PCR: POSITIVE — AB

## 2017-04-24 LAB — GLUCOSE, CAPILLARY
GLUCOSE-CAPILLARY: 262 mg/dL — AB (ref 65–99)
GLUCOSE-CAPILLARY: 239 mg/dL — AB (ref 65–99)
Glucose-Capillary: 227 mg/dL — ABNORMAL HIGH (ref 65–99)
Glucose-Capillary: 244 mg/dL — ABNORMAL HIGH (ref 65–99)
Glucose-Capillary: 277 mg/dL — ABNORMAL HIGH (ref 65–99)

## 2017-04-24 LAB — HEPATITIS PANEL, ACUTE
HEP B C IGM: NEGATIVE
HEP B S AG: NEGATIVE
Hep A IgM: NEGATIVE

## 2017-04-24 LAB — ECHOCARDIOGRAM COMPLETE
Height: 70 in
Weight: 3731.2 oz

## 2017-04-24 LAB — PROTIME-INR
INR: 1.6
PROTHROMBIN TIME: 18.9 s — AB (ref 11.4–15.2)

## 2017-04-24 MED ORDER — TRAZODONE HCL 100 MG PO TABS
100.0000 mg | ORAL_TABLET | Freq: Every day | ORAL | Status: DC
  Administered 2017-04-24: 100 mg via ORAL
  Filled 2017-04-24: qty 1

## 2017-04-24 MED ORDER — WARFARIN SODIUM 5 MG PO TABS
5.0000 mg | ORAL_TABLET | Freq: Once | ORAL | Status: AC
  Administered 2017-04-24: 5 mg via ORAL
  Filled 2017-04-24: qty 1

## 2017-04-24 MED ORDER — ORAL CARE MOUTH RINSE
15.0000 mL | Freq: Two times a day (BID) | OROMUCOSAL | Status: DC
  Administered 2017-04-24 – 2017-04-25 (×3): 15 mL via OROMUCOSAL

## 2017-04-24 NOTE — Progress Notes (Addendum)
ANTICOAGULATION CONSULT NOTE Pharmacy Consult for Warfarin  Indication: History of VTE  No Known Allergies  Patient Measurements: Height: 5\' 10"  (177.8 cm) Weight: 233 lb 3.2 oz (105.8 kg) IBW/kg (Calculated) : 73  Vital Signs: Temp: 98.2 F (36.8 C) (03/31 1112) Temp Source: Oral (03/31 1112) BP: 122/72 (03/31 1112) Pulse Rate: 94 (03/31 1112)  Labs: Recent Labs    04/22/17 1735 04/23/17 0146 04/23/17 0803 04/23/17 1332 04/24/17 0256 04/24/17 0849  HGB 11.2* 11.0*  --   --   --  10.8*  HCT 36.6* 35.4*  --   --   --  36.0*  PLT 277 257  --   --   --  288  LABPROT 16.2* 16.4*  --   --  18.9*  --   INR 1.31 1.33  --   --  1.60  --   CREATININE 1.65* 1.82*  --   --   --  2.03*  TROPONINI  --  <0.03 <0.03 <0.03  --   --     Estimated Creatinine Clearance: 37.7 mL/min (A) (by C-G formula based on SCr of 2.03 mg/dL (H)).   Medical History: Past Medical History:  Diagnosis Date  . Acute lower GI bleeding    a. admitted to [facility] 04/11/2013;  b. 04/2014 EGD: 1.  gastric erosions, mild prox gastritis and bulbar duodenitis->Protonix.  . Addison's disease ([place])   . Anemia   . Anemia in CKD (chronic kidney disease) 06/25/2015  . Chronic back pain   . Chronic diastolic CHF (congestive heart failure) ([place])    a. His initial ejection fraction was between 30 and 35%;  b. 04/2013 Echo: EF 50-55% Gr1 DD, mild-mod MR;  c. 04/2014 Echo: EF 60-65%, Gr 1 DD, triv TR.  . CKD (chronic kidney disease), stage III ([place])   . Coronary artery disease    a. 07/2014 - s/p difficult PCI - had pressure wire analysis of LAD s/p DES in mid segment c/b wire-induced dissection in the distal segment of the LAD which was treated with repeated balloon inflations. Also had DES to rPDA - again had either spasm distal to the stent placement or an edge dissection but the vessel was small in that area; procedure aborted.  . Daily headache   . DVT (deep venous thrombosis) ([place])    a. 02/2013 superficial  thrombophlebitis --> Xarelto later d/c'd 2/2 GIB;  b. 04/2014 Acute Right PT, Peroneal, Popliteal, Femoral, and common femoral DVT-->coumadin  . GERD (gastroesophageal reflux disease)   . H/O hiatal hernia   . Hepatitis    a. 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  . History of aortic insufficiency   . Hx of cardiovascular stress test    a. ETT-Myoview 7/14:  Low risk, inferior defect consistent with thinning, no ischemia, normal wall motion, EF 54%  . Hypertension   . Hypogonadism male   . Obesity   . On home oxygen therapy    a. added 04/2014 in setting of PE.  Marland Kitchen Peptic ulcer disease   . Pulmonary embolism ([place])    a. 04/2014 CTA: Bilat submassive PE ->coumadin.  . Pulmonary HTN ([place])   . Thyroid disease     Assessment: 77 y/o on Coumadin 3mg  daily exc 1.5mg  on Thurs/Sun PTA for hx VTE. Admit INR 1.33, now up to 1.6 (dose ordered for 3/30 but not given due to unclear reasons). CBC stable. No bleeding documented. CTA limited but no PE seen.  Goal of Therapy:  INR 2-3 Monitor platelets by  anticoagulation protocol: Yes   Plan:  Give Coumadin 5mg  PO x 1 tonight Monitor daily INR, CBC, s/s of bleed  Elicia Lamp, PharmD, BCPS Clinical Pharmacist Clinical phone for 04/24/2017 until 3:30pm: [phone] If after 3:30pm, please call main pharmacy at: [phone] 04/24/2017 11:32 AM

## 2017-04-24 NOTE — Progress Notes (Signed)
  Echocardiogram 2D Echocardiogram has been performed.  [name] 04/24/2017, 3:32 PM

## 2017-04-24 NOTE — Progress Notes (Signed)
Patient ID: [name], male   DOB: 06/14/1940, 77 y.o.   MRN: [id number]  PROGRESS NOTE    [name]  PNT:[id number] DOB: [birth year] DOA: 04/22/2017 PCP: Lawerance Cruel, MD   Outpatient Specialists:   Brief Narrative: Patient is a 77 y.o. male, w Addisons disease, Anemia, CHF, CAD, CKD stage 3, DVT, PE, apparently c/o dyspnea. Pt noted subjective fever.  Denies cough, cp, palp, n/v, diarrhea, brbpr, dysuria.  In the ER he had Septic features with no obvious source.    Assessment & Plan:   Principal Problem:   Sepsis ([place]) Active Problems:   CKD (chronic kidney disease) stage 3, GFR 30-59 ml/min (HCC)   Anemia   Fever   Diabetes mellitus without complication ([place])   #1 Sepsis: Appears resolved. Source unclear. May be due to viral cause. Will continue empiric antibiotics. Cultural sensitivities currently pending. On Zosyn and vancomycin. If cultures remain negative will Dc abx  #2 diabetes: Blood sugar is uncontrolled.  Continue management. On sliding scale insulin.  We'll continue Lantus due to high blood sugar  #3 anemia of chronic disease: monitor H&H closely.  #4 chronic kidney disease stage III: renal function is at baseline.  #5 leukocytosis: White count is elevated wih no obvious cause. Will follow closely. Continue antibiotics and monitor   DVT prophylaxis: warfarin  Code Status: Full  Family Communication: None available  Disposition Plan: Home   Consultants:   None  Procedures:   Antimicrobials: vancomycin and Zosyn 3/30 >>>  Subjective: Patient is currently stable but still weak.  No fever at this point.  No nausea vomiting or diarrhea.  Generalized malaise reported.  Objective: Vitals:   04/23/17 2323 04/24/17 0413 04/24/17 0800 04/24/17 1112  BP: 99/70  122/81 122/72  Pulse: 89  (!) 102 94  Resp: 17  (!) 21 (!) 22  Temp: 98.6 F (37 C) 98.4 F (36.9 C)  98.2 F (36.8 C)  TempSrc: Oral Oral  Oral  SpO2: 90% 94% 91% 90%    Weight:  105.8 kg (233 lb 3.2 oz)    Height:        Intake/Output Summary (Last 24 hours) at 04/24/2017 1426 Last data filed at 04/24/2017 0900 Gross per 24 hour  Intake 762 ml  Output 825 ml  Net -63 ml   Filed Weights   04/22/17 1640 04/23/17 2117 04/24/17 0413  Weight: 104.3 kg (230 lb) 105.1 kg (231 lb 9.6 oz) 105.8 kg (233 lb 3.2 oz)    Examination:  General exam: Appears calm and comfortable  Respiratory system: Clear to auscultation. Respiratory effort normal. Cardiovascular system: S1 & S2 heard, RRR. No JVD, murmurs, rubs, gallops or clicks. No pedal edema. Gastrointestinal system: Abdomen is nondistended, soft and nontender. No organomegaly or masses felt. Normal bowel sounds heard. Central nervous system: Alert and oriented. No focal neurological deficits. Extremities: Symmetric 5 x 5 power. Skin: No rashes, lesions or ulcers Psychiatry: Judgement and insight appear normal. Mood & affect appropriate.     Data Reviewed: I have personally reviewed following labs and imaging studies  CBC: Recent Labs  Lab 04/22/17 1735 04/23/17 0146 04/24/17 0849  WBC 27.7* 32.0* 18.9*  NEUTROABS 23.6*  --  13.0  HGB 11.2* 11.0* 10.8*  HCT 36.6* 35.4* 36.0*  MCV 79.4 79.0 79.8  PLT 277 257 086   Basic Metabolic Panel: Recent Labs  Lab 04/22/17 1735 04/23/17 0146 04/24/17 0849  NA 134* 132* 132*  K 3.5 4.3 3.6  CL 100*  100* 103  CO2 23 19* 21*  GLUCOSE 351* 459* 237*  BUN 23* 22* 31*  CREATININE 1.65* 1.82* 2.03*  CALCIUM 8.6* 7.9* 8.2*   GFR: Estimated Creatinine Clearance: 37.7 mL/min (A) (by C-G formula based on SCr of 2.03 mg/dL (H)). Liver Function Tests: Recent Labs  Lab 04/22/17 1735 04/23/17 0146 04/24/17 0849  AST 54* 60* 40  ALT 86* 83* 70*  ALKPHOS 116 99 89  BILITOT 0.8 0.7 0.5  PROT 6.6 6.0* 6.4*  ALBUMIN 3.0* 2.8* 2.9*   No results for input(s): LIPASE, AMYLASE in the last 168 hours. No results for input(s): AMMONIA in the last 168  hours. Coagulation Profile: Recent Labs  Lab 04/19/17 0845 04/22/17 1735 04/23/17 0146 04/24/17 0256  INR 1.0 1.31 1.33 1.60   Cardiac Enzymes: Recent Labs  Lab 04/23/17 0146 04/23/17 0803 04/23/17 1332  TROPONINI <0.03 <0.03 <0.03   BNP (last 3 results) Recent Labs    05/19/16 1500  PROBNP 40   HbA1C: No results for input(s): HGBA1C in the last 72 hours. CBG: Recent Labs  Lab 04/23/17 1837 04/23/17 2125 04/23/17 2325 04/24/17 0626 04/24/17 1110  GLUCAP 315* 200* 178* 227* 244*   Lipid Profile: No results for input(s): CHOL, HDL, LDLCALC, TRIG, CHOLHDL, LDLDIRECT in the last 72 hours. Thyroid Function Tests: Recent Labs    04/22/17 2013  TSH 1.135  FREET4 0.72   Anemia Panel: No results for input(s): VITAMINB12, FOLATE, FERRITIN, TIBC, IRON, RETICCTPCT in the last 72 hours. Urine analysis:    Component Value Date/Time   COLORURINE YELLOW 04/22/2017 1649   APPEARANCEUR HAZY (A) 04/22/2017 1649   LABSPEC 1.014 04/22/2017 1649   PHURINE 6.0 04/22/2017 1649   GLUCOSEU >=500 (A) 04/22/2017 1649   HGBUR NEGATIVE 04/22/2017 1649   BILIRUBINUR NEGATIVE 04/22/2017 1649   KETONESUR NEGATIVE 04/22/2017 1649   PROTEINUR NEGATIVE 04/22/2017 1649   UROBILINOGEN 0.2 03/21/2007 1015   NITRITE NEGATIVE 04/22/2017 1649   LEUKOCYTESUR NEGATIVE 04/22/2017 1649   Sepsis Labs: @LABRCNTIP (procalcitonin:4,lacticidven:4)  ) Recent Results (from the past 240 hour(s))  Culture, blood (Routine x 2)     Status: None (Preliminary result)   Collection Time: 04/22/17  4:57 PM  Result Value Ref Range Status   Specimen Description BLOOD LEFT ANTECUBITAL  Final   Special Requests IN PEDIATRIC BOTTLE Blood Culture adequate volume  Final   Culture   Final    NO GROWTH 2 DAYS Performed at [facility], [place] [address], [place], [place] [address]    Report Status PENDING  Incomplete  Culture, blood (Routine x 2)     Status: None (Preliminary result)   Collection Time:  04/22/17  5:35 PM  Result Value Ref Range Status   Specimen Description BLOOD RIGHT ANTECUBITAL  Final   Special Requests   Final    BOTTLES DRAWN AEROBIC AND ANAEROBIC Blood Culture adequate volume   Culture   Final    NO GROWTH 2 DAYS Performed at [facility], [place] [address], [place], [place] [address]    Report Status PENDING  Incomplete  MRSA PCR Screening     Status: Abnormal   Collection Time: 04/24/17  1:02 AM  Result Value Ref Range Status   MRSA by PCR POSITIVE (A) NEGATIVE Final    Comment:        The GeneXpert MRSA Assay (FDA approved for NASAL specimens only), is one component of a comprehensive MRSA colonization surveillance program. It is not intended to diagnose MRSA infection  nor to guide or monitor treatment for MRSA infections. RESULT CALLED TO, READ BACK BY AND VERIFIED WITH: RN,KARI DAVIDSON [id number] @0550  [name] Performed at [facility] [address], [place], [place] [address]          Radiology Studies: Dg Chest 2 View  Result Date: 04/22/2017 CLINICAL DATA:  Fever.  Worsening shortness of breath and weakness. EXAM: CHEST - 2 VIEW COMPARISON:  01/21/2017 FINDINGS: The cardiac silhouette is normal in size. Aortic atherosclerosis is noted. Lung volumes are low with bronchovascular crowding/minimal atelectasis in the lung bases. No lobar consolidation, edema, pleural effusion, pneumothorax is identified. Thoracic spondylosis is noted. IMPRESSION: Low lung volumes without evidence of active cardiopulmonary disease. Electronically Signed   By: Logan Bores M.D.   On: 04/22/2017 18:19   Ct Head Wo Contrast  Result Date: 04/22/2017 CLINICAL DATA:  Generalized weakness. EXAM: CT HEAD WITHOUT CONTRAST TECHNIQUE: Contiguous axial images were obtained from the base of the skull through the vertex without intravenous contrast. COMPARISON:  Head CT 01/21/2017 FINDINGS: Brain: No mass lesion, intraparenchymal hemorrhage or extra-axial collection. No  evidence of acute cortical infarct. There is periventricular hypoattenuation compatible with chronic microvascular disease. Vascular: No hyperdense vessel or unexpected vascular calcification. Skull: Normal visualized skull base, calvarium and extracranial soft tissues. Sinuses/Orbits: Moderate right maxillary sinus mucosal thickening. Normal orbits. IMPRESSION: Chronic ischemic microangiopathy without acute intracranial abnormality. Electronically Signed   By: Ulyses Jarred M.D.   On: 04/22/2017 21:54   Ct Angio Chest Pe W Or Wo Contrast  Result Date: 04/22/2017 CLINICAL DATA:  Generalize weakness with shortness of breath EXAM: CT ANGIOGRAPHY CHEST WITH CONTRAST TECHNIQUE: Multidetector CT imaging of the chest was performed using the standard protocol during bolus administration of intravenous contrast. Multiplanar CT image reconstructions and MIPs were obtained to evaluate the vascular anatomy. CONTRAST:  48mL ISOVUE-370 IOPAMIDOL (ISOVUE-370) INJECTION 76% COMPARISON:  Chest x-ray 04/22/2017, CT chest 06/21/2014 FINDINGS: Cardiovascular: Poor opacification of distal branch vessels limits assessment for emboli. No gross central embolus is seen. Ectatic ascending aorta measuring up to 3.8 cm. Moderate aortic atherosclerosis. Coronary vascular calcification. Normal heart size. No pericardial effusion Mediastinum/Nodes: Midline trachea. No thyroid mass. No significant adenopathy. Esophagus within normal limits. Lungs/Pleura: Lungs are clear. No pleural effusion or pneumothorax. Atelectasis in the bases Upper Abdomen: Steatosis.  Surgical clips in the gallbladder fossa Musculoskeletal: Gynecomastia. Degenerative changes of the spine. No acute or suspicious lesion Review of the MIP images confirms the above findings. IMPRESSION: 1. Limited study secondary to poor contrast opacification of the distal pulmonary branch vessels. No gross acute central embolus is seen. 2. No acute pulmonary infiltrates. 3. Marked  steatosis Aortic Atherosclerosis (ICD10-I70.0). Electronically Signed   By: Donavan Foil M.D.   On: 04/22/2017 21:42   US Abdomen Limited Ruq  Result Date: 04/23/2017 CLINICAL DATA:  77 year old male with abnormal liver function. History of cholecystectomy. EXAM: ULTRASOUND ABDOMEN LIMITED RIGHT UPPER QUADRANT COMPARISON:  CT of the abdomen pelvis dated 03/20/2012 FINDINGS: Evaluation is limited due to patient's body habitus. Gallbladder: Cholecystectomy. Common bile duct: Diameter: 5 mm Liver: Diffuse increased liver echogenicity most consistent with fatty infiltration. Portal vein is patent on color Doppler imaging with normal direction of blood flow towards the liver. A 6.2 x 5.3 cm right renal upper pole cyst. IMPRESSION: 1. Status post prior cholecystectomy. 2. Findings of fatty liver. 3. Patent main portal vein with hepatopetal flow. Electronically Signed   By: Anner Crete M.D.   On: 04/23/2017 04:16  Scheduled Meds: . carvedilol  6.25 mg Oral BID WC  . ferrous sulfate  325 mg Oral Q breakfast  . furosemide  20 mg Oral Daily  . gabapentin  600 mg Oral Daily  . hydrocortisone  20 mg Oral BID WC   And  . hydrocortisone  10 mg Oral Q1200  . insulin aspart  0-5 Units Subcutaneous QHS  . insulin aspart  0-9 Units Subcutaneous TID WC  . insulin glargine  20 Units Subcutaneous QHS  . isosorbide mononitrate  15 mg Oral Daily  . losartan  25 mg Oral Daily  . mouth rinse  15 mL Mouth Rinse BID  . pantoprazole  40 mg Oral Daily  . potassium chloride SA  20 mEq Oral Daily  . sodium chloride flush  3 mL Intravenous Q12H  . thyroid  60 mg Oral Daily  . traMADol  100 mg Oral TID  . traZODone  100 mg Oral QPM  . warfarin  5 mg Oral ONCE-1800  . Warfarin - Pharmacist Dosing Inpatient   Does not apply q1800   Continuous Infusions: . sodium chloride    . piperacillin-tazobactam (ZOSYN)  IV 3.375 g (04/24/17 0944)  . vancomycin Stopped (04/24/17 0118)     LOS: 2 days    Time  spent: 3 minutes    Nicholson Starace,LAWAL, MD [facility] Pager [phone]  If 7PM-7AM, please contact night-coverage [email or web address] Password [email or web address] 04/24/2017, 2:26 PM

## 2017-04-25 LAB — PROTIME-INR
INR: 1.52
Prothrombin Time: 18.2 seconds — ABNORMAL HIGH (ref 11.4–15.2)

## 2017-04-25 LAB — GLUCOSE, CAPILLARY
GLUCOSE-CAPILLARY: 187 mg/dL — AB (ref 65–99)
Glucose-Capillary: 176 mg/dL — ABNORMAL HIGH (ref 65–99)

## 2017-04-25 MED ORDER — LIVING WELL WITH DIABETES BOOK
Freq: Once | Status: AC
  Administered 2017-04-25: 15:00:00
  Filled 2017-04-25: qty 1

## 2017-04-25 MED ORDER — INSULIN GLARGINE 100 UNIT/ML SOLOSTAR PEN: 20.0000 [IU] | PEN_INJECTOR | Freq: Every day | SUBCUTANEOUS | 11 refills | Status: DC

## 2017-04-25 MED ORDER — BLOOD GLUCOSE MONITOR KIT: PACK | 0 refills | Status: DC

## 2017-04-25 MED ORDER — METFORMIN HCL 500 MG PO TABS: 500.0000 mg | ORAL_TABLET | Freq: Two times a day (BID) | ORAL | 11 refills | Status: DC

## 2017-04-25 MED ORDER — AMOXICILLIN-POT CLAVULANATE 875-125 MG PO TABS: 1.0000 | ORAL_TABLET | Freq: Two times a day (BID) | ORAL | 0 refills | Status: AC

## 2017-04-25 MED ORDER — INSULIN PEN NEEDLE 31G X 5 MM MISC: 0 refills | Status: DC

## 2017-04-25 MED ORDER — WARFARIN SODIUM 3 MG PO TABS: 3.0000 mg | ORAL_TABLET | Freq: Once | ORAL | Status: DC

## 2017-04-25 MED ORDER — INSULIN GLARGINE 100 UNITS/ML SOLOSTAR PEN: 20.0000 [IU] | PEN_INJECTOR | Freq: Every day | SUBCUTANEOUS | 11 refills | Status: DC

## 2017-04-25 MED ORDER — LIVING WELL WITH DIABETES BOOK: 0 refills | Status: DC

## 2017-04-25 MED ORDER — INSULIN GLARGINE 100 UNIT/ML ~~LOC~~ SOLN: 20.0000 [IU] | Freq: Every day | SUBCUTANEOUS | 11 refills | Status: DC

## 2017-04-25 NOTE — Discharge Summary (Signed)
Physician Discharge Summary  [name]:[id number] DOB: 11-11-1940 DOA: 04/22/2017  PCP: Lawerance Cruel, MD  Admit date: 04/22/2017 Discharge date: 04/25/2017  Time spent: 35 minutes  Recommendations for Outpatient Follow-up:  1. Complete antibiotics  2. Take Insulin while on Steroids  Discharge Diagnoses:  Principal Problem:   Sepsis ([place]) Active Problems:   CKD (chronic kidney disease) stage 3, GFR 30-59 ml/min (HCC)   Anemia   Fever   Diabetes mellitus without complication [facility])   Discharge Condition: Good  Diet recommendation: Diabetes  Filed Weights   04/23/17 2117 04/24/17 0413 04/25/17 0503  Weight: 105.1 kg (231 lb 9.6 oz) 105.8 kg (233 lb 3.2 oz) 108 kg (238 lb 1.6 oz)    History of present illness:  [name]  is a 77 y.o. male, w Addisons disease, Anemia, CHF, CAD, CKD stage 3, DVT, PE, apparently c/o dyspnea today. Pt noted subjective fever.  Denies cough, cp, palp, n/v, diarrhea, brbpr, dysuria.  patient was found to have white count of more than 30,000. His temperature has returned to normal however no source of infection.He had evidence of sepsis at the time of admission.   Hospital Course:  Patient was admitted and empirically started on vancomycin and Zosyn.He was also monitored on telemetry. Urine and blood cultures obtained. CT angiogram of the chest done showing no acute infiltrates. He was treated with IV fluids and supportive care. His blood sugar was found to be in the 3 to 400s most likely due to steroids from his Addison's disease treatment. He was started on Lantus and sliding scale insulin. Patient responded to treatment very well. The time of discharge he is being discharged on empiric Augmentin. All cultures are negative. He is afebrile white count has improved. His leukocytosis may be related to some Addison's disease flareup. He will go home on Lantus and metformin for his diabetes. Continue home regimen of father  medications.  Procedures: CTA chest and Ct Brain all no acute findings Consultations:  None  Discharge Exam: Vitals:   04/25/17 0503 04/25/17 0815  BP: (!) 152/72 125/86  Pulse: 83 90  Resp: 17 17  Temp: 97.7 F (36.5 C) 97.8 F (36.6 C)  SpO2: 98% 97%    General: Stable, NAD Cardiovascular: RRR Respiratory: Good Ae bilaterally  Discharge Instructions   Discharge Instructions    Diet - low sodium heart healthy   Complete by:  As directed    Increase activity slowly   Complete by:  As directed      Allergies as of 04/25/2017   No Known Allergies     Medication List    TAKE these medications   amoxicillin-clavulanate 875-125 MG tablet Commonly known as:  AUGMENTIN Take 1 tablet by mouth 2 (two) times daily for 5 days.   CALCIUM PO Take by mouth.   carvedilol 6.25 MG tablet Commonly known as:  COREG Take 1 tablet (6.25 mg total) by mouth 2 (two) times daily with a meal.   ferrous sulfate 325 (65 FE) MG tablet Take 1 tablet (325 mg total) by mouth daily with breakfast.   folic acid 322 MCG tablet Commonly known as:  FOLVITE Take 400 mcg by mouth daily.   furosemide 40 MG tablet Commonly known as:  LASIX Take 20 mg by mouth 2 (two) times daily.   gabapentin 600 MG tablet Commonly known as:  NEURONTIN Take 600 mg by mouth 3 (three) times daily.   hydrocortisone 20 MG tablet Commonly known as:  CORTEF  Take 20 mg by mouth. Taking three times daily 20mg /10mg /20mg    ibuprofen 200 MG tablet Commonly known as:  ADVIL,MOTRIN Take 400 mg by mouth at bedtime as needed (PAIN).   insulin glargine 100 UNIT/ML injection Commonly known as:  LANTUS Inject 0.2 mLs (20 Units total) into the skin at bedtime.   isosorbide mononitrate 30 MG 24 hr tablet Commonly known as:  IMDUR Take 0.5 tablets (15 mg total) by mouth daily.   losartan 25 MG tablet Commonly known as:  COZAAR Take 25 mg by mouth daily.   MAGNESIUM PO Take by mouth.   metFORMIN 500 MG  tablet Commonly known as:  GLUCOPHAGE Take 1 tablet (500 mg total) by mouth 2 (two) times daily with a meal.   metoCLOPramide 5 MG tablet Commonly known as:  REGLAN Take 5 mg by mouth daily.   nitroGLYCERIN 0.4 MG SL tablet Commonly known as:  NITROSTAT PLACE 1 TABLET UNDER TONGUE EVERY 5 MINUTES AS NEEDED FOR CHEST PAIN (UP TO 3 DOSES)   pantoprazole 40 MG tablet Commonly known as:  PROTONIX Take 40 mg by mouth daily.   potassium chloride 10 MEQ tablet Commonly known as:  K-DUR,KLOR-CON Take 10 mEq by mouth daily.   PROAIR HFA 108 (90 Base) MCG/ACT inhaler Generic drug:  albuterol INHALE 2 PUFFS INTO THE LUNGS EVERY 6 (SIX) HOURS AS NEEDED FOR WHEEZING OR SHORTNESS OF BREATH.   promethazine 25 MG tablet Commonly known as:  PHENERGAN Take 25 mg by mouth every 6 (six) hours as needed for nausea.   thyroid 60 MG tablet Commonly known as:  ARMOUR Take 60 mg by mouth daily.   traMADol 50 MG tablet Commonly known as:  ULTRAM Take 100 mg by mouth 2 (two) times daily.   traZODone 100 MG tablet Commonly known as:  DESYREL Take 100 mg by mouth every evening.   vitamin B-12 1000 MCG tablet Commonly known as:  CYANOCOBALAMIN Take 1,000 mcg by mouth daily.   VITAMIN D3 PO Take 1,000 capsules by mouth daily.   VITAMIN E PO Take by mouth.   warfarin 3 MG tablet Commonly known as:  COUMADIN Take as directed. If you are unsure how to take this medication, talk to your nurse or doctor. Original instructions:  Take 1/2 tab to 1 tab daily or as directed by [facility]   ZINC PO Take by mouth.      No Known Allergies    The results of significant diagnostics from this hospitalization (including imaging, microbiology, ancillary and laboratory) are listed below for reference.    Significant Diagnostic Studies: Dg Chest 2 View  Result Date: 04/22/2017 CLINICAL DATA:  Fever.  Worsening shortness of breath and weakness. EXAM: CHEST - 2 VIEW COMPARISON:  01/21/2017  FINDINGS: The cardiac silhouette is normal in size. Aortic atherosclerosis is noted. Lung volumes are low with bronchovascular crowding/minimal atelectasis in the lung bases. No lobar consolidation, edema, pleural effusion, pneumothorax is identified. Thoracic spondylosis is noted. IMPRESSION: Low lung volumes without evidence of active cardiopulmonary disease. Electronically Signed   By: Logan Bores M.D.   On: 04/22/2017 18:19   Ct Head Wo Contrast  Result Date: 04/22/2017 CLINICAL DATA:  Generalized weakness. EXAM: CT HEAD WITHOUT CONTRAST TECHNIQUE: Contiguous axial images were obtained from the base of the skull through the vertex without intravenous contrast. COMPARISON:  Head CT 01/21/2017 FINDINGS: Brain: No mass lesion, intraparenchymal hemorrhage or extra-axial collection. No evidence of acute cortical infarct. There is periventricular hypoattenuation compatible with chronic microvascular  disease. Vascular: No hyperdense vessel or unexpected vascular calcification. Skull: Normal visualized skull base, calvarium and extracranial soft tissues. Sinuses/Orbits: Moderate right maxillary sinus mucosal thickening. Normal orbits. IMPRESSION: Chronic ischemic microangiopathy without acute intracranial abnormality. Electronically Signed   By: Ulyses Jarred M.D.   On: 04/22/2017 21:54   Ct Angio Chest Pe W Or Wo Contrast  Result Date: 04/22/2017 CLINICAL DATA:  Generalize weakness with shortness of breath EXAM: CT ANGIOGRAPHY CHEST WITH CONTRAST TECHNIQUE: Multidetector CT imaging of the chest was performed using the standard protocol during bolus administration of intravenous contrast. Multiplanar CT image reconstructions and MIPs were obtained to evaluate the vascular anatomy. CONTRAST:  50mL ISOVUE-370 IOPAMIDOL (ISOVUE-370) INJECTION 76% COMPARISON:  Chest x-ray 04/22/2017, CT chest 06/21/2014 FINDINGS: Cardiovascular: Poor opacification of distal branch vessels limits assessment for emboli. No gross  central embolus is seen. Ectatic ascending aorta measuring up to 3.8 cm. Moderate aortic atherosclerosis. Coronary vascular calcification. Normal heart size. No pericardial effusion Mediastinum/Nodes: Midline trachea. No thyroid mass. No significant adenopathy. Esophagus within normal limits. Lungs/Pleura: Lungs are clear. No pleural effusion or pneumothorax. Atelectasis in the bases Upper Abdomen: Steatosis.  Surgical clips in the gallbladder fossa Musculoskeletal: Gynecomastia. Degenerative changes of the spine. No acute or suspicious lesion Review of the MIP images confirms the above findings. IMPRESSION: 1. Limited study secondary to poor contrast opacification of the distal pulmonary branch vessels. No gross acute central embolus is seen. 2. No acute pulmonary infiltrates. 3. Marked steatosis Aortic Atherosclerosis (ICD10-I70.0). Electronically Signed   By: Donavan Foil M.D.   On: 04/22/2017 21:42   US Abdomen Limited Ruq  Result Date: 04/23/2017 CLINICAL DATA:  77 year old male with abnormal liver function. History of cholecystectomy. EXAM: ULTRASOUND ABDOMEN LIMITED RIGHT UPPER QUADRANT COMPARISON:  CT of the abdomen pelvis dated 03/20/2012 FINDINGS: Evaluation is limited due to patient's body habitus. Gallbladder: Cholecystectomy. Common bile duct: Diameter: 5 mm Liver: Diffuse increased liver echogenicity most consistent with fatty infiltration. Portal vein is patent on color Doppler imaging with normal direction of blood flow towards the liver. A 6.2 x 5.3 cm right renal upper pole cyst. IMPRESSION: 1. Status post prior cholecystectomy. 2. Findings of fatty liver. 3. Patent main portal vein with hepatopetal flow. Electronically Signed   By: Anner Crete M.D.   On: 04/23/2017 04:16    Microbiology: Recent Results (from the past 240 hour(s))  Culture, blood (Routine x 2)     Status: None (Preliminary result)   Collection Time: 04/22/17  4:57 PM  Result Value Ref Range Status   Specimen  Description BLOOD LEFT ANTECUBITAL  Final   Special Requests IN PEDIATRIC BOTTLE Blood Culture adequate volume  Final   Culture   Final    NO GROWTH 2 DAYS Performed at [facility], [address], [place], [place] [address]    Report Status PENDING  Incomplete  Culture, blood (Routine x 2)     Status: None (Preliminary result)   Collection Time: 04/22/17  5:35 PM  Result Value Ref Range Status   Specimen Description BLOOD RIGHT ANTECUBITAL  Final   Special Requests   Final    BOTTLES DRAWN AEROBIC AND ANAEROBIC Blood Culture adequate volume   Culture   Final    NO GROWTH 2 DAYS Performed at [facility], [place] [address], [place], [place] [address]    Report Status PENDING  Incomplete  MRSA PCR Screening     Status: Abnormal   Collection Time: 04/24/17  1:02 AM  Result Value Ref Range Status   MRSA by PCR POSITIVE (A) NEGATIVE Final    Comment:        The GeneXpert MRSA Assay (FDA approved for NASAL specimens only), is one component of a comprehensive MRSA colonization surveillance program. It is not intended to diagnose MRSA infection nor to guide or monitor treatment for MRSA infections. RESULT CALLED TO, READ BACK BY AND VERIFIED WITH: RN,KARI DAVIDSON [id number] @0550  THANEY Performed at [facility] [address] [place], [place] [address]      Labs: Basic Metabolic Panel: Recent Labs  Lab 04/22/17 1735 04/23/17 0146 04/24/17 0849  NA 134* 132* 132*  K 3.5 4.3 3.6  CL 100* 100* 103  CO2 23 19* 21*  GLUCOSE 351* 459* 237*  BUN 23* 22* 31*  CREATININE 1.65* 1.82* 2.03*  CALCIUM 8.6* 7.9* 8.2*   Liver Function Tests: Recent Labs  Lab 04/22/17 1735 04/23/17 0146 04/24/17 0849  AST 54* 60* 40  ALT 86* 83* 70*  ALKPHOS 116 99 89  BILITOT 0.8 0.7 0.5  PROT 6.6 6.0* 6.4*  ALBUMIN 3.0* 2.8* 2.9*   No results for input(s): LIPASE, AMYLASE in the last 168 hours. No results for input(s): AMMONIA in the last 168 hours. CBC: Recent Labs   Lab 04/22/17 1735 04/23/17 0146 04/24/17 0849  WBC 27.7* 32.0* 18.9*  NEUTROABS 23.6*  --  13.0  HGB 11.2* 11.0* 10.8*  HCT 36.6* 35.4* 36.0*  MCV 79.4 79.0 79.8  PLT 277 257 288   Cardiac Enzymes: Recent Labs  Lab 04/23/17 0146 04/23/17 0803 04/23/17 1332  TROPONINI <0.03 <0.03 <0.03   BNP: BNP (last 3 results) Recent Labs    04/22/17 2013  BNP 35.3    ProBNP (last 3 results) Recent Labs    05/19/16 1500  PROBNP 40    CBG: Recent Labs  Lab 04/24/17 1616 04/24/17 2139 04/24/17 2308 04/25/17 0608 04/25/17 1134  GLUCAP 277* 262* 239* 187* 176*       SignedBarbette Merino MD.  [facility] 04/25/2017, 12:21 PM

## 2017-04-25 NOTE — Progress Notes (Addendum)
Inpatient Diabetes Program Recommendations  AACE/ADA: New Consensus Statement on Inpatient Glycemic Control (2015)  Target Ranges:  Prepandial:   less than 140 mg/dL      Peak postprandial:   less than 180 mg/dL (1-2 hours)      Critically ill patients:  140 - 180 mg/dL   Lab Results  Component Value Date   GLUCAP 176 (H) 04/25/2017   HGBA1C 6.2 (H) 05/11/2014    Review of Glycemic ControlResults for JEVIN, CAMINO (MRN [id number]) as of 04/25/2017 15:07  Ref. Range 04/24/2017 16:16 04/24/2017 21:39 04/24/2017 23:08 04/25/2017 06:08 04/25/2017 11:34  Glucose-Capillary Latest Ref Range: 65 - 99 mg/dL 277 (H) 262 (H) 239 (H) 187 (H) 176 (H)   Diabetes history: None however son states patient was given a meter in the past Outpatient Diabetes medications: None Current orders for Inpatient glycemic control:  Lantus 20 units daily, Novolog moderate tid with meals  Inpatient Diabetes Program Recommendations:   Spoke to MD.  Patient will be d/c'd home on Lantus insulin and metformin.  RN has shown patient DM videos.  Discussed and demonstrated use of insulin pen to patient and son.  Son states that his wife takes insulin and that he is familiar.  Allowed patient to demonstrate use of insulin pen using insulin dome for practice.  Patient applied needle, primed with 2 units, and dialed dose.  We discussed insulin sites and rotation of sites as well.  I also taught him to hold insulin pen in place for 10 seconds during administration.  His PCP is Dr. Harrington Challenger.  Encouraged f/u within 7 days of d/c from hospital.  We also discussed monitoring including checking his blood sugars prior to meals and at HS.  Also discussed normal blood sugar levels and to call MD if blood sugars consistently > 180 mg/dL or consistently less than 100 mg/dL.  Son wrote notes during our discussion as well.  I gave them pamphlet as well on use of Lantus solostar pen.  Patient will need glucose meter as well.  Text paged MD to discuss.  We  discussed hypoglycemia signs and symptoms and how to treat as well.  Patient was able to teach back.  Placed referral as well for outpatient DM education per protocol/cosign required.  Discussed with RN as well. Thanks,  Adah Perl, RN, BC-ADM Inpatient Diabetes Coordinator Pager [phone] (8a-5p)

## 2017-04-25 NOTE — Progress Notes (Signed)
ANTICOAGULATION CONSULT NOTE Pharmacy Consult for Warfarin  Indication: History of VTE  No Known Allergies  Patient Measurements: Height: 5\' 10"  (032.1 cm) Weight: 238 lb 1.6 oz (108 kg) IBW/kg (Calculated) : 73  Vital Signs: Temp: 97.8 F (36.6 C) (04/01 0815) Temp Source: Oral (04/01 0815) BP: 125/86 (04/01 0815) Pulse Rate: 90 (04/01 0815)  Labs: Recent Labs    04/22/17 1735 04/23/17 0146 04/23/17 0803 04/23/17 1332 04/24/17 0256 04/24/17 0849 04/25/17 0617  HGB 11.2* 11.0*  --   --   --  10.8*  --   HCT 36.6* 35.4*  --   --   --  36.0*  --   PLT 277 257  --   --   --  288  --   LABPROT 16.2* 16.4*  --   --  18.9*  --  18.2*  INR 1.31 1.33  --   --  1.60  --  1.52  CREATININE 1.65* 1.82*  --   --   --  2.03*  --   TROPONINI  --  <0.03 <0.03 <0.03  --   --   --     Estimated Creatinine Clearance: 38.1 mL/min (A) (by C-G formula based on SCr of 2.03 mg/dL (H)).   Assessment: 77 Gates on Coumadin PTA for hx VTE. Admit INR 1.3, now 1.52 - dose not given 3/30 for unclear reasons. Cbc stable. Limited study but no PE seen. Likely discharge today on augmentin.  Coumadin PTA dose: 3mg  daily exc 1.5mg  on Thurs/Sun  Goal of Therapy:  INR 2-3 Monitor platelets by anticoagulation protocol: Yes   Plan:  Continue home dose - Give Coumadin 3mg  PO x 1 tonight Monitor daily INR, CBC, s/s of bleed  Maryanna Shape, PharmD, BCPS  Clinical Pharmacist  Pager: [phone]  04/25/2017 12:12 PM

## 2017-04-25 NOTE — Care Management Note (Signed)
Case Management Note Marvetta Gibbons RN, BSN Unit 4E-Case Manager [phone]   Patient Details  Name: [name] MRN: [id number] Date of Birth: [birth year]  Subjective/Objective:    Pt admitted with sepsis with no obvious source,  DM- uncontrolled sugars              Action/Plan: PTA Pt lived at home with spouse- PCP-  C. Melinda Crutch. - pt for d/c home today- no CM needs noted for transition home.   Expected Discharge Date:  04/25/17               Expected Discharge Plan:  Home/Self Care  In-House Referral:  NA  Discharge planning Services  CM Consult  Post Acute Care Choice:  NA Choice offered to:  NA  DME Arranged:    DME Agency:     HH Arranged:    HH Agency:     Status of Service:  Completed, signed off  If discussed at [place] of Stay Meetings, dates discussed:    Discharge Disposition: home/self care   Additional Comments:  Dawayne Patricia, RN 04/25/2017, 12:16 PM

## 2017-04-27 LAB — CULTURE, BLOOD (ROUTINE X 2)
Culture: NO GROWTH
Culture: NO GROWTH
Special Requests: ADEQUATE
Special Requests: ADEQUATE

## 2017-04-28 DIAGNOSIS — Z794 Long term (current) use of insulin: Secondary | ICD-10-CM | POA: Diagnosis not present

## 2017-04-28 DIAGNOSIS — I10 Essential (primary) hypertension: Secondary | ICD-10-CM | POA: Diagnosis not present

## 2017-04-28 DIAGNOSIS — E271 Primary adrenocortical insufficiency: Secondary | ICD-10-CM | POA: Diagnosis not present

## 2017-04-28 DIAGNOSIS — R509 Fever, unspecified: Secondary | ICD-10-CM | POA: Diagnosis not present

## 2017-04-28 DIAGNOSIS — E039 Hypothyroidism, unspecified: Secondary | ICD-10-CM | POA: Diagnosis not present

## 2017-04-28 DIAGNOSIS — E1169 Type 2 diabetes mellitus with other specified complication: Secondary | ICD-10-CM | POA: Diagnosis not present

## 2017-04-28 DIAGNOSIS — R531 Weakness: Secondary | ICD-10-CM | POA: Diagnosis not present

## 2017-05-10 ENCOUNTER — Encounter: Payer: Self-pay | Admitting: Sports Medicine

## 2017-05-10 NOTE — Progress Notes (Signed)
Patient cancelled   This encounter was created in error - please disregard.

## 2017-05-12 ENCOUNTER — Other Ambulatory Visit: Payer: Self-pay | Admitting: Cardiovascular Disease

## 2017-05-16 ENCOUNTER — Ambulatory Visit (INDEPENDENT_AMBULATORY_CARE_PROVIDER_SITE_OTHER): Payer: Medicare HMO | Admitting: *Deleted

## 2017-05-16 DIAGNOSIS — Z5181 Encounter for therapeutic drug level monitoring: Secondary | ICD-10-CM

## 2017-05-16 LAB — POCT INR: INR: 2

## 2017-05-16 NOTE — Patient Instructions (Signed)
Description   Continue taking 1 tablet daily except 1/2 tablet on Sundays and Thursdays.  Recheck in 3 weeks.

## 2017-05-17 ENCOUNTER — Other Ambulatory Visit: Payer: Self-pay | Admitting: *Deleted

## 2017-05-19 DIAGNOSIS — I10 Essential (primary) hypertension: Secondary | ICD-10-CM | POA: Diagnosis not present

## 2017-05-19 DIAGNOSIS — E1165 Type 2 diabetes mellitus with hyperglycemia: Secondary | ICD-10-CM | POA: Diagnosis not present

## 2017-05-19 DIAGNOSIS — E039 Hypothyroidism, unspecified: Secondary | ICD-10-CM | POA: Diagnosis not present

## 2017-05-19 DIAGNOSIS — E273 Drug-induced adrenocortical insufficiency: Secondary | ICD-10-CM | POA: Diagnosis not present

## 2017-05-19 DIAGNOSIS — E291 Testicular hypofunction: Secondary | ICD-10-CM | POA: Diagnosis not present

## 2017-05-23 ENCOUNTER — Ambulatory Visit: Payer: Medicare Other | Admitting: Cardiovascular Disease

## 2017-05-25 ENCOUNTER — Encounter: Payer: Self-pay | Admitting: *Deleted

## 2017-05-25 ENCOUNTER — Other Ambulatory Visit: Payer: Self-pay | Admitting: *Deleted

## 2017-05-25 NOTE — Patient Outreach (Signed)
[place] [facility]) Care Management  05/25/2017  [name] 1940-05-23 [id number]  Referral via MD office: Please evaluate for home assistance to prevent further hospitalizations; optimize diabetes care-DX Type 2 Diabetes,-  Per office MD office notes-HGB A1C is 11.5.  Recent hospital stay 3/29-04/25/2017 with dx sepsis.  Telephone call to patient x 1 unsuccessful; unable to leave message.   Plan: Geophysicist/field seismologist. Follow up 2-4 business days.  Sherrin Daisy, RN BSN [place] Management Coordinator [facility] Care Management  [phone]

## 2017-05-26 DIAGNOSIS — Z7984 Long term (current) use of oral hypoglycemic drugs: Secondary | ICD-10-CM | POA: Diagnosis not present

## 2017-05-26 DIAGNOSIS — E1169 Type 2 diabetes mellitus with other specified complication: Secondary | ICD-10-CM | POA: Diagnosis not present

## 2017-05-26 DIAGNOSIS — L299 Pruritus, unspecified: Secondary | ICD-10-CM | POA: Diagnosis not present

## 2017-05-31 ENCOUNTER — Other Ambulatory Visit: Payer: Self-pay | Admitting: *Deleted

## 2017-05-31 NOTE — Patient Outreach (Signed)
[place] [facility]) Care Management  05/31/2017  CARMELLO CABINESS 15-Mar-1940 [id number]  Referral via MD office: Please evaluate for home assistance to prevent further hospitalizations; optimize diabetes care-DX Type 2 Diabetes,-  Per office MD office notes-HGB A1C is 11.5.  Recent hospital stay 3/29-04/25/2017 with dx sepsis.  Telephone call attempt x 2. Telephone call to home number; access code requested -unable to leave message. Call made to alternate number; left HIPPA compliant voice mail.   Plan. Outreach letter already sent. Follow up in 2-4 business days.   Sherrin Daisy, RN BSN [place] Management Coordinator [facility] Care Management  [phone]

## 2017-06-03 ENCOUNTER — Other Ambulatory Visit: Payer: Self-pay | Admitting: *Deleted

## 2017-06-03 NOTE — Patient Outreach (Signed)
[place] [facility]) Care Management  06/03/2017  [name] 1941-01-11 [id number]  Referral via MD office: Please evaluate for home assistance to prevent further hospitalizations; optimize diabetes care-DX Type 2 Diabetes,-  Per office MD office notes-HGB A1C is 11.5.  Recent hospital stay 3/29-04/25/2017 with dx sepsis.  Telephone call attempt; person who answered call advised that patient was not home but took message to have patient return call.  Plan: Will follow up. Outreach letter already sent 5/1.  Sherrin Daisy, RN BSN [place] Management Coordinator [facility] Care Management  [phone]

## 2017-06-07 ENCOUNTER — Other Ambulatory Visit: Payer: Self-pay | Admitting: *Deleted

## 2017-06-07 NOTE — Patient Outreach (Signed)
[place] [facility]) Care Management  06/07/2017  [name] March 15, 1940 [id number]  Referral via MD office: Please evaluate for home assistance to prevent further hospitalizations; optimize diabetes care-DX Type 2 Diabetes,-  Per office MD office notes-HGB A1C is 11.5.  Recent hospital stay 3/29-04/25/2017 with dx sepsis.  Unsuccessful call attempts x 3; no response to outreach letter. Care coordination call to Rosemary Holms in referrals who was advised of unsuccessful attempts to contact patient. States patient has upcoming office visit and that she would make notation in chart. States patient had been advised of [facility] referral by office.   Plan: Case closure. Will reopen case as needed.    Sherrin Daisy, RN BSN [place] Management Coordinator [facility] Care Management  [phone]

## 2017-06-16 ENCOUNTER — Ambulatory Visit: Payer: Medicare HMO | Admitting: Neurology

## 2017-06-18 ENCOUNTER — Other Ambulatory Visit: Payer: Self-pay | Admitting: Cardiovascular Disease

## 2017-07-01 ENCOUNTER — Ambulatory Visit (INDEPENDENT_AMBULATORY_CARE_PROVIDER_SITE_OTHER): Payer: Medicare HMO | Admitting: *Deleted

## 2017-07-01 DIAGNOSIS — Z5181 Encounter for therapeutic drug level monitoring: Secondary | ICD-10-CM

## 2017-07-01 LAB — POCT INR: INR: 1.4 — AB (ref 2.0–3.0)

## 2017-07-01 NOTE — Telephone Encounter (Signed)
Added this need of appt with Dr Acie Fredrickson or his team to his appt note

## 2017-07-01 NOTE — Patient Instructions (Signed)
Description   Today and tomorrow take 1.5 tablets then continue taking 1 tablet daily except 1/2 tablet on Sundays and Thursdays.  Recheck in 1 week.

## 2017-07-01 NOTE — Telephone Encounter (Signed)
When he comes back next week - he needs to get an appointment with Dr. [name]/[name]'s team   Does not look like he has seen a provider since April of 2018.

## 2017-07-06 DIAGNOSIS — M25472 Effusion, left ankle: Secondary | ICD-10-CM | POA: Diagnosis not present

## 2017-07-06 DIAGNOSIS — J45909 Unspecified asthma, uncomplicated: Secondary | ICD-10-CM | POA: Diagnosis not present

## 2017-07-06 DIAGNOSIS — R531 Weakness: Secondary | ICD-10-CM | POA: Diagnosis not present

## 2017-07-06 DIAGNOSIS — I959 Hypotension, unspecified: Secondary | ICD-10-CM | POA: Diagnosis not present

## 2017-07-06 DIAGNOSIS — I509 Heart failure, unspecified: Secondary | ICD-10-CM | POA: Diagnosis not present

## 2017-07-07 ENCOUNTER — Emergency Department (HOSPITAL_COMMUNITY): Payer: Medicare HMO

## 2017-07-07 ENCOUNTER — Emergency Department (HOSPITAL_COMMUNITY)
Admit: 2017-07-07 | Discharge: 2017-07-07 | Disposition: A | Discharge status: Home / Self Care | Payer: Medicare HMO | Attending: Emergency Medicine | Admitting: Emergency Medicine

## 2017-07-07 ENCOUNTER — Other Ambulatory Visit: Payer: Self-pay

## 2017-07-07 ENCOUNTER — Inpatient Hospital Stay (HOSPITAL_COMMUNITY)
Admission: EM | Admit: 2017-07-07 | Discharge: 2017-07-11 | DRG: 603 | Disposition: A | Discharge status: Home / Self Care | Payer: Medicare HMO | Attending: Internal Medicine | Admitting: Internal Medicine

## 2017-07-07 ENCOUNTER — Encounter (HOSPITAL_COMMUNITY): Payer: Self-pay | Admitting: Emergency Medicine

## 2017-07-07 DIAGNOSIS — Z955 Presence of coronary angioplasty implant and graft: Secondary | ICD-10-CM

## 2017-07-07 DIAGNOSIS — E039 Hypothyroidism, unspecified: Secondary | ICD-10-CM | POA: Diagnosis present

## 2017-07-07 DIAGNOSIS — R0902 Hypoxemia: Secondary | ICD-10-CM

## 2017-07-07 DIAGNOSIS — E860 Dehydration: Secondary | ICD-10-CM | POA: Diagnosis present

## 2017-07-07 DIAGNOSIS — J9610 Chronic respiratory failure, unspecified whether with hypoxia or hypercapnia: Secondary | ICD-10-CM | POA: Diagnosis present

## 2017-07-07 DIAGNOSIS — E1122 Type 2 diabetes mellitus with diabetic chronic kidney disease: Secondary | ICD-10-CM | POA: Diagnosis present

## 2017-07-07 DIAGNOSIS — Z7952 Long term (current) use of systemic steroids: Secondary | ICD-10-CM

## 2017-07-07 DIAGNOSIS — R0602 Shortness of breath: Secondary | ICD-10-CM | POA: Diagnosis not present

## 2017-07-07 DIAGNOSIS — I130 Hypertensive heart and chronic kidney disease with heart failure and stage 1 through stage 4 chronic kidney disease, or unspecified chronic kidney disease: Secondary | ICD-10-CM | POA: Diagnosis present

## 2017-07-07 DIAGNOSIS — I10 Essential (primary) hypertension: Secondary | ICD-10-CM | POA: Diagnosis not present

## 2017-07-07 DIAGNOSIS — J9611 Chronic respiratory failure with hypoxia: Secondary | ICD-10-CM | POA: Diagnosis not present

## 2017-07-07 DIAGNOSIS — I351 Nonrheumatic aortic (valve) insufficiency: Secondary | ICD-10-CM | POA: Diagnosis present

## 2017-07-07 DIAGNOSIS — K449 Diaphragmatic hernia without obstruction or gangrene: Secondary | ICD-10-CM | POA: Diagnosis present

## 2017-07-07 DIAGNOSIS — N183 Chronic kidney disease, stage 3 (moderate): Secondary | ICD-10-CM | POA: Diagnosis not present

## 2017-07-07 DIAGNOSIS — I2510 Atherosclerotic heart disease of native coronary artery without angina pectoris: Secondary | ICD-10-CM | POA: Diagnosis present

## 2017-07-07 DIAGNOSIS — D631 Anemia in chronic kidney disease: Secondary | ICD-10-CM | POA: Diagnosis not present

## 2017-07-07 DIAGNOSIS — M549 Dorsalgia, unspecified: Secondary | ICD-10-CM | POA: Diagnosis not present

## 2017-07-07 DIAGNOSIS — N189 Chronic kidney disease, unspecified: Secondary | ICD-10-CM | POA: Diagnosis not present

## 2017-07-07 DIAGNOSIS — Z9119 Patient's noncompliance with other medical treatment and regimen: Secondary | ICD-10-CM

## 2017-07-07 DIAGNOSIS — Z9049 Acquired absence of other specified parts of digestive tract: Secondary | ICD-10-CM

## 2017-07-07 DIAGNOSIS — E119 Type 2 diabetes mellitus without complications: Secondary | ICD-10-CM

## 2017-07-07 DIAGNOSIS — Z794 Long term (current) use of insulin: Secondary | ICD-10-CM

## 2017-07-07 DIAGNOSIS — G8929 Other chronic pain: Secondary | ICD-10-CM | POA: Diagnosis present

## 2017-07-07 DIAGNOSIS — Z79899 Other long term (current) drug therapy: Secondary | ICD-10-CM

## 2017-07-07 DIAGNOSIS — E785 Hyperlipidemia, unspecified: Secondary | ICD-10-CM | POA: Diagnosis present

## 2017-07-07 DIAGNOSIS — Z9981 Dependence on supplemental oxygen: Secondary | ICD-10-CM

## 2017-07-07 DIAGNOSIS — K219 Gastro-esophageal reflux disease without esophagitis: Secondary | ICD-10-CM | POA: Diagnosis present

## 2017-07-07 DIAGNOSIS — J449 Chronic obstructive pulmonary disease, unspecified: Secondary | ICD-10-CM | POA: Diagnosis not present

## 2017-07-07 DIAGNOSIS — I272 Pulmonary hypertension, unspecified: Secondary | ICD-10-CM | POA: Diagnosis present

## 2017-07-07 DIAGNOSIS — Z22322 Carrier or suspected carrier of Methicillin resistant Staphylococcus aureus: Secondary | ICD-10-CM

## 2017-07-07 DIAGNOSIS — I5032 Chronic diastolic (congestive) heart failure: Secondary | ICD-10-CM | POA: Diagnosis not present

## 2017-07-07 DIAGNOSIS — R609 Edema, unspecified: Secondary | ICD-10-CM | POA: Diagnosis not present

## 2017-07-07 DIAGNOSIS — Z86718 Personal history of other venous thrombosis and embolism: Secondary | ICD-10-CM | POA: Diagnosis not present

## 2017-07-07 DIAGNOSIS — E1142 Type 2 diabetes mellitus with diabetic polyneuropathy: Secondary | ICD-10-CM | POA: Diagnosis present

## 2017-07-07 DIAGNOSIS — Z7901 Long term (current) use of anticoagulants: Secondary | ICD-10-CM

## 2017-07-07 DIAGNOSIS — Z79890 Hormone replacement therapy (postmenopausal): Secondary | ICD-10-CM

## 2017-07-07 DIAGNOSIS — Z86711 Personal history of pulmonary embolism: Secondary | ICD-10-CM

## 2017-07-07 DIAGNOSIS — Z8672 Personal history of thrombophlebitis: Secondary | ICD-10-CM | POA: Diagnosis not present

## 2017-07-07 DIAGNOSIS — L03116 Cellulitis of left lower limb: Secondary | ICD-10-CM | POA: Diagnosis not present

## 2017-07-07 DIAGNOSIS — N179 Acute kidney failure, unspecified: Secondary | ICD-10-CM | POA: Diagnosis not present

## 2017-07-07 DIAGNOSIS — L0390 Cellulitis, unspecified: Secondary | ICD-10-CM | POA: Diagnosis present

## 2017-07-07 DIAGNOSIS — F0390 Unspecified dementia without behavioral disturbance: Secondary | ICD-10-CM | POA: Diagnosis present

## 2017-07-07 DIAGNOSIS — Z8711 Personal history of peptic ulcer disease: Secondary | ICD-10-CM

## 2017-07-07 DIAGNOSIS — E271 Primary adrenocortical insufficiency: Secondary | ICD-10-CM | POA: Diagnosis not present

## 2017-07-07 DIAGNOSIS — R0603 Acute respiratory distress: Secondary | ICD-10-CM | POA: Diagnosis present

## 2017-07-07 DIAGNOSIS — Z832 Family history of diseases of the blood and blood-forming organs and certain disorders involving the immune mechanism: Secondary | ICD-10-CM

## 2017-07-07 LAB — HEPATIC FUNCTION PANEL
ALT: 50 U/L (ref 17–63)
AST: 36 U/L (ref 15–41)
Albumin: 3.1 g/dL — ABNORMAL LOW (ref 3.5–5.0)
Alkaline Phosphatase: 76 U/L (ref 38–126)
Bilirubin, Direct: <0.1 mg/dL — ABNORMAL LOW (ref 0.1–0.5)
TOTAL PROTEIN: 6.6 g/dL (ref 6.5–8.1)
Total Bilirubin: 0.5 mg/dL (ref 0.3–1.2)

## 2017-07-07 LAB — BASIC METABOLIC PANEL
ANION GAP: 15 (ref 5–15)
BUN: 30 mg/dL — ABNORMAL HIGH (ref 6–20)
CHLORIDE: 104 mmol/L (ref 101–111)
CO2: 18 mmol/L — AB (ref 22–32)
Calcium: 8.4 mg/dL — ABNORMAL LOW (ref 8.9–10.3)
Creatinine, Ser: 2.1 mg/dL — ABNORMAL HIGH (ref 0.61–1.24)
GFR calc non Af Amer: 29 mL/min — ABNORMAL LOW (ref >60)
GFR, EST AFRICAN AMERICAN: 34 mL/min — AB (ref >60)
Glucose, Bld: 153 mg/dL — ABNORMAL HIGH (ref 65–99)
POTASSIUM: 3.1 mmol/L — AB (ref 3.5–5.1)
Sodium: 137 mmol/L (ref 135–145)

## 2017-07-07 LAB — CBC
HEMATOCRIT: 42.6 % (ref 39.0–52.0)
HEMOGLOBIN: 13.1 g/dL (ref 13.0–17.0)
MCH: 25.1 pg — AB (ref 26.0–34.0)
MCHC: 30.8 g/dL (ref 30.0–36.0)
MCV: 81.8 fL (ref 78.0–100.0)
Platelets: 421 10*3/uL — ABNORMAL HIGH (ref 150–400)
RBC: 5.21 MIL/uL (ref 4.22–5.81)
RDW: 18.1 % — ABNORMAL HIGH (ref 11.5–15.5)
WBC: 16.7 10*3/uL — ABNORMAL HIGH (ref 4.0–10.5)

## 2017-07-07 LAB — PROTIME-INR
INR: 1.65
Prothrombin Time: 19.4 seconds — ABNORMAL HIGH (ref 11.4–15.2)

## 2017-07-07 LAB — GLUCOSE, CAPILLARY: Glucose-Capillary: 98 mg/dL (ref 65–99)

## 2017-07-07 LAB — CBG MONITORING, ED: Glucose-Capillary: 125 mg/dL — ABNORMAL HIGH (ref 65–99)

## 2017-07-07 LAB — I-STAT TROPONIN, ED: TROPONIN I, POC: 0 ng/mL (ref 0.00–0.08)

## 2017-07-07 LAB — BRAIN NATRIURETIC PEPTIDE: B NATRIURETIC PEPTIDE 5: 35 pg/mL (ref 0.0–100.0)

## 2017-07-07 LAB — MAGNESIUM: MAGNESIUM: 2.2 mg/dL (ref 1.7–2.4)

## 2017-07-07 MED ORDER — VITAMIN D 1000 UNITS PO TABS
1000.0000 [IU] | ORAL_TABLET | Freq: Every day | ORAL | Status: DC
  Administered 2017-07-08 – 2017-07-11 (×4): 1000 [IU] via ORAL
  Filled 2017-07-07 (×6): qty 1

## 2017-07-07 MED ORDER — ISOSORBIDE MONONITRATE ER 30 MG PO TB24
15.0000 mg | ORAL_TABLET | Freq: Every day | ORAL | Status: DC
  Administered 2017-07-07 – 2017-07-11 (×5): 15 mg via ORAL
  Filled 2017-07-07 (×5): qty 1

## 2017-07-07 MED ORDER — POTASSIUM CHLORIDE CRYS ER 20 MEQ PO TBCR
40.0000 meq | EXTENDED_RELEASE_TABLET | ORAL | Status: AC
  Administered 2017-07-07 (×2): 40 meq via ORAL
  Filled 2017-07-07 (×2): qty 2

## 2017-07-07 MED ORDER — FUROSEMIDE 20 MG PO TABS
20.0000 mg | ORAL_TABLET | Freq: Two times a day (BID) | ORAL | Status: DC
  Administered 2017-07-08: 20 mg via ORAL
  Filled 2017-07-07: qty 1

## 2017-07-07 MED ORDER — INSULIN ASPART 100 UNIT/ML ~~LOC~~ SOLN: 0.0000 [IU] | Freq: Every day | SUBCUTANEOUS | Status: DC

## 2017-07-07 MED ORDER — INSULIN ASPART 100 UNIT/ML ~~LOC~~ SOLN
0.0000 [IU] | Freq: Three times a day (TID) | SUBCUTANEOUS | Status: DC
  Administered 2017-07-07 – 2017-07-08 (×2): 1 [IU] via SUBCUTANEOUS
  Administered 2017-07-08 – 2017-07-09 (×2): 2 [IU] via SUBCUTANEOUS
  Administered 2017-07-09: 1 [IU] via SUBCUTANEOUS
  Administered 2017-07-09 – 2017-07-10 (×2): 2 [IU] via SUBCUTANEOUS
  Administered 2017-07-10: 1 [IU] via SUBCUTANEOUS
  Administered 2017-07-10: 3 [IU] via SUBCUTANEOUS
  Filled 2017-07-07: qty 1

## 2017-07-07 MED ORDER — WARFARIN SODIUM 3 MG PO TABS
3.0000 mg | ORAL_TABLET | Freq: Once | ORAL | Status: AC
  Administered 2017-07-07: 3 mg via ORAL
  Filled 2017-07-07: qty 1

## 2017-07-07 MED ORDER — ONDANSETRON HCL 4 MG PO TABS: 4.0000 mg | ORAL_TABLET | Freq: Four times a day (QID) | ORAL | Status: DC | PRN

## 2017-07-07 MED ORDER — GABAPENTIN 600 MG PO TABS
600.0000 mg | ORAL_TABLET | Freq: Three times a day (TID) | ORAL | Status: DC
  Administered 2017-07-07 – 2017-07-11 (×12): 600 mg via ORAL
  Filled 2017-07-07 (×12): qty 1

## 2017-07-07 MED ORDER — SODIUM CHLORIDE 0.9 % IV SOLN
INTRAVENOUS | Status: AC
  Administered 2017-07-07: 18:00:00 via INTRAVENOUS

## 2017-07-07 MED ORDER — FOLIC ACID 1 MG PO TABS
0.5000 mg | ORAL_TABLET | Freq: Every day | ORAL | Status: DC
  Administered 2017-07-08 – 2017-07-11 (×4): 0.5 mg via ORAL
  Filled 2017-07-07 (×6): qty 1

## 2017-07-07 MED ORDER — INSULIN GLARGINE 100 UNIT/ML ~~LOC~~ SOLN
20.0000 [IU] | Freq: Every day | SUBCUTANEOUS | Status: DC
  Administered 2017-07-07 – 2017-07-08 (×2): 20 [IU] via SUBCUTANEOUS
  Administered 2017-07-09: 5 [IU] via SUBCUTANEOUS
  Administered 2017-07-10: 10 [IU] via SUBCUTANEOUS
  Filled 2017-07-07 (×4): qty 0.2

## 2017-07-07 MED ORDER — PANTOPRAZOLE SODIUM 40 MG PO TBEC
40.0000 mg | DELAYED_RELEASE_TABLET | Freq: Every day | ORAL | Status: DC
  Administered 2017-07-07 – 2017-07-11 (×5): 40 mg via ORAL
  Filled 2017-07-07 (×5): qty 1

## 2017-07-07 MED ORDER — TECHNETIUM TO 99M ALBUMIN AGGREGATED
4.0000 | Freq: Once | INTRAVENOUS | Status: AC | PRN
  Administered 2017-07-07: 4 via INTRAVENOUS

## 2017-07-07 MED ORDER — TRAZODONE HCL 100 MG PO TABS
100.0000 mg | ORAL_TABLET | Freq: Every evening | ORAL | Status: DC
  Administered 2017-07-07 – 2017-07-10 (×4): 100 mg via ORAL
  Filled 2017-07-07 (×2): qty 1
  Filled 2017-07-07: qty 2
  Filled 2017-07-07: qty 1

## 2017-07-07 MED ORDER — TRAMADOL HCL 50 MG PO TABS
100.0000 mg | ORAL_TABLET | Freq: Two times a day (BID) | ORAL | Status: DC
  Administered 2017-07-07 – 2017-07-11 (×6): 100 mg via ORAL
  Filled 2017-07-07 (×7): qty 2

## 2017-07-07 MED ORDER — HYDROCORTISONE 10 MG PO TABS
10.0000 mg | ORAL_TABLET | Freq: Every day | ORAL | Status: DC
  Administered 2017-07-08 – 2017-07-10 (×3): 10 mg via ORAL
  Filled 2017-07-07 (×4): qty 1

## 2017-07-07 MED ORDER — ALBUTEROL SULFATE (2.5 MG/3ML) 0.083% IN NEBU
3.0000 mL | INHALATION_SOLUTION | Freq: Four times a day (QID) | RESPIRATORY_TRACT | Status: DC
  Administered 2017-07-08 (×2): 3 mL via RESPIRATORY_TRACT
  Filled 2017-07-07 (×3): qty 3

## 2017-07-07 MED ORDER — ACETAMINOPHEN 650 MG RE SUPP: 650.0000 mg | Freq: Four times a day (QID) | RECTAL | Status: DC | PRN

## 2017-07-07 MED ORDER — ONDANSETRON HCL 4 MG/2ML IJ SOLN: 4.0000 mg | Freq: Four times a day (QID) | INTRAMUSCULAR | Status: DC | PRN

## 2017-07-07 MED ORDER — TECHNETIUM TC 99M DIETHYLENETRIAME-PENTAACETIC ACID
30.0000 | Freq: Once | INTRAVENOUS | Status: AC | PRN
  Administered 2017-07-07: 30 via INTRAVENOUS

## 2017-07-07 MED ORDER — PIPERACILLIN-TAZOBACTAM 3.375 G IVPB 30 MIN
3.3750 g | Freq: Once | INTRAVENOUS | Status: AC
  Administered 2017-07-07: 3.375 g via INTRAVENOUS
  Filled 2017-07-07: qty 50

## 2017-07-07 MED ORDER — SODIUM CHLORIDE 0.9 % IV SOLN
1.0000 g | INTRAVENOUS | Status: DC
  Administered 2017-07-07 – 2017-07-10 (×4): 1 g via INTRAVENOUS
  Filled 2017-07-07 (×5): qty 10

## 2017-07-07 MED ORDER — IBUPROFEN 200 MG PO TABS
400.0000 mg | ORAL_TABLET | Freq: Every evening | ORAL | Status: DC | PRN
  Administered 2017-07-10 – 2017-07-11 (×2): 400 mg via ORAL
  Filled 2017-07-07 (×2): qty 2

## 2017-07-07 MED ORDER — HYDROCORTISONE 20 MG PO TABS
20.0000 mg | ORAL_TABLET | Freq: Two times a day (BID) | ORAL | Status: DC
  Administered 2017-07-08 – 2017-07-11 (×7): 20 mg via ORAL
  Filled 2017-07-07 (×8): qty 1

## 2017-07-07 MED ORDER — METOCLOPRAMIDE HCL 5 MG PO TABS
5.0000 mg | ORAL_TABLET | Freq: Every day | ORAL | Status: DC
  Administered 2017-07-07 – 2017-07-11 (×5): 5 mg via ORAL
  Filled 2017-07-07 (×5): qty 1

## 2017-07-07 MED ORDER — CARVEDILOL 6.25 MG PO TABS
6.2500 mg | ORAL_TABLET | Freq: Two times a day (BID) | ORAL | Status: DC
  Administered 2017-07-07 – 2017-07-10 (×6): 6.25 mg via ORAL
  Filled 2017-07-07 (×6): qty 1

## 2017-07-07 MED ORDER — POTASSIUM CHLORIDE CRYS ER 10 MEQ PO TBCR: 10.0000 meq | EXTENDED_RELEASE_TABLET | Freq: Every day | ORAL | Status: DC

## 2017-07-07 MED ORDER — WARFARIN - PHARMACIST DOSING INPATIENT: Freq: Every day | Status: DC

## 2017-07-07 MED ORDER — FUROSEMIDE 20 MG PO TABS: 20.0000 mg | ORAL_TABLET | Freq: Two times a day (BID) | ORAL | Status: DC

## 2017-07-07 MED ORDER — THYROID 60 MG PO TABS
60.0000 mg | ORAL_TABLET | Freq: Every day | ORAL | Status: DC
  Administered 2017-07-08 – 2017-07-11 (×4): 60 mg via ORAL
  Filled 2017-07-07 (×5): qty 1

## 2017-07-07 MED ORDER — ACETAMINOPHEN 325 MG PO TABS
650.0000 mg | ORAL_TABLET | Freq: Four times a day (QID) | ORAL | Status: DC | PRN
  Administered 2017-07-09: 650 mg via ORAL
  Filled 2017-07-07: qty 2

## 2017-07-07 MED ORDER — VITAMIN B-12 1000 MCG PO TABS
1000.0000 ug | ORAL_TABLET | Freq: Every day | ORAL | Status: DC
Start: 2017-07-07 — End: 2017-07-11
  Administered 2017-07-08 – 2017-07-11 (×4): 1000 ug via ORAL
  Filled 2017-07-07 (×5): qty 1

## 2017-07-07 MED ORDER — LOSARTAN POTASSIUM 25 MG PO TABS: 25.0000 mg | ORAL_TABLET | Freq: Every day | ORAL | Status: DC

## 2017-07-07 NOTE — ED Triage Notes (Addendum)
Pts wife reports pt has had left leg and foot swelling x2 days with sob x2 weeks, had d dimer drawn at pcp yesterday and was told he needed to come to ED last night. Pt visibly sob in triage. Also reports a fall 5 days ago, pain to right ribs since. Breath sounds present bilaterally, diminished in lower lobes

## 2017-07-07 NOTE — Progress Notes (Signed)
VASCULAR LAB PRELIMINARY  PRELIMINARY  PRELIMINARY  PRELIMINARY  Left lower extremity venous duplex completed.    Preliminary report:  There is no DVT or SVT noted in the left lower extremity.  Called results to Dr. Roni Bread, [facility], RVT 07/07/2017, 10:29 AM

## 2017-07-07 NOTE — ED Notes (Signed)
Report given to oncoming RN.

## 2017-07-07 NOTE — Progress Notes (Signed)
ANTICOAGULATION CONSULT NOTE - Initial Consult  Pharmacy Consult for warfarin Indication: Hx of DVT/PE  No Known Allergies  Patient Measurements:   Heparin Dosing Weight:   Vital Signs: Temp: 97.6 F (36.4 C) (06/13 0732) Temp Source: Oral (06/13 0732) BP: 112/63 (06/13 1547) Pulse Rate: 85 (06/13 1547)  Labs: Recent Labs    07/07/17 0750 07/07/17 0840  HGB 13.1  --   HCT 42.6  --   PLT 421*  --   LABPROT  --  19.4*  INR  --  1.65  CREATININE 2.10*  --     CrCl cannot be calculated (Unknown ideal weight.).   Medical History: Past Medical History:  Diagnosis Date  . Acute lower GI bleeding    a. admitted to [facility] 04/11/2013;  b. 04/2014 EGD: 1.  gastric erosions, mild prox gastritis and bulbar duodenitis->Protonix.  . Addison's disease ([place])   . Anemia   . Anemia in CKD (chronic kidney disease) 06/25/2015  . Chronic back pain   . Chronic diastolic CHF (congestive heart failure) ([place])    a. His initial ejection fraction was between 30 and 35%;  b. 04/2013 Echo: EF 50-55% Gr1 DD, mild-mod MR;  c. 04/2014 Echo: EF 60-65%, Gr 1 DD, triv TR.  . CKD (chronic kidney disease), stage III ([place])   . Coronary artery disease    a. 07/2014 - s/p difficult PCI - had pressure wire analysis of LAD s/p DES in mid segment c/b wire-induced dissection in the distal segment of the LAD which was treated with repeated balloon inflations. Also had DES to rPDA - again had either spasm distal to the stent placement or an edge dissection but the vessel was small in that area; procedure aborted.  . Daily headache   . DVT (deep venous thrombosis) ([place])    a. 02/2013 superficial thrombophlebitis --> Xarelto later d/c'd 2/2 GIB;  b. 04/2014 Acute Right PT, Peroneal, Popliteal, Femoral, and common femoral DVT-->coumadin  . GERD (gastroesophageal reflux disease)   . H/O hiatal hernia   . Hepatitis    a. 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  . History of aortic insufficiency   . Hx of cardiovascular stress test    a.  ETT-Myoview 7/14:  Low risk, inferior defect consistent with thinning, no ischemia, normal wall motion, EF 54%  . Hypertension   . Hypogonadism male   . Obesity   . On home oxygen therapy    a. added 04/2014 in setting of PE.  Marland Kitchen Peptic ulcer disease   . Pulmonary embolism ([place])    a. 04/2014 CTA: Bilat submassive PE ->coumadin.  . Pulmonary HTN ([place])   . Thyroid disease     Medications:   Assessment: Patient with history of DVT/PE presents to ED with left leg swelling and SOB. Currently on PTA warfarin. INR is subtherapeutic at 1.65. Was subtherapeutic last week at 1.4 when seen at the Coumadin clinic. He was told to take 2 boosted doses over 2 days with no change to the overall weekly dose. Will give him 3 mg tonight. [place] negative for DVT, leg concerning for cellulitis.   PTA Warfarin: 3 mg Mon, Tue, Wed, Fri and Sat; 1.5 mg Sun and Thurs  CBC wnl, no bleeding reported.   Goal of Therapy:  INR 2-3 Monitor platelets by anticoagulation protocol: Yes   Plan:  Warfarin 3 mg po x 1 Daily INR and CBC  Leroy Libman, PharmD Pharmacy Resident
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
## 2017-07-07 NOTE — ED Notes (Signed)
Patient transported to [facility].  

## 2017-07-07 NOTE — ED Notes (Signed)
Spoke with lab in reference to adding a BNP on to labs already sent - states they can do so - MD aware of same

## 2017-07-07 NOTE — ED Notes (Signed)
Report given to [facility] on 5N - ready to accept pt

## 2017-07-07 NOTE — ED Notes (Signed)
Ambulated pt with pulse ox on. Made it from bed to doorway and pts oxygen dropped to 70. Pt stated he was dizzy so we got back in bed. Pt placed back on oxygen and it went up to 98.

## 2017-07-07 NOTE — ED Notes (Signed)
Lunch tray ordered; heart healthy

## 2017-07-07 NOTE — H&P (Signed)
History and Physical    CHRITOPHER COSTER ALP:[id number] DOB: 1940-09-16 DOA: 07/07/2017  PCP: Lawerance Cruel, MD Patient coming from: home  Chief Complaint: left leg swelling/ sob   HPI: [name] is a very pleasant slightly demented 77 y.o. male with medical history significant for Addisons disease, chronic kidney disease stage III, CADstatus post PCI 2016, DVT PE, chronic diastolic heart failure, chronic respiratory failure should be on home oxygen but is noncompliant, back pain since emergency Department chief complaint of swelling and redness in his left leg as well as shortness of breath. Initial evaluation reveals oxygen saturation level of 70% on room air with ambulation and left leg concerning for cellulitis. [facility] are asked to admit  Information is obtained from the patient and the chart as well as the wife who is at the bedside noting that information from patient may be unreliable due to early dementia. Wife reports over the last 2 weeks patient's developed worsening shortness of breath as well as swelling in his left foot his left leg. Of note patient is supposed to be on oxygen but is noncompliant. Wife denies that his shortness of breath was "worse than usual" no coughing noted. No complaints of chest pain palpitations. No fever chills nausea vomiting diarrhea. He uses his home inhaler. In addition wife reports patient fell while getting out of bed 5 days ago. Yesterday they went to primary care provider noted that his blood pressure dropped when going from sitting to standing. Lab work was done presumably including a d-dimer that was elevated and he was contacted and sent to the emergency department. He denies dysuria hematuria frequency or urgency. He denies diarrhea constipation melena bright red blood per rectum.    ED Course: in the emergency department he's afebrile hemodynamically stable. Has an oxygen saturation level 70% with ambulation on room air. He  is provided with Zosyn  Review of Systems: As per HPI otherwise all other systems reviewed and are negative.   Ambulatory Status: ambulates with walker moderate help with adls  Past Medical History:  Diagnosis Date  . Acute lower GI bleeding    a. admitted to [facility] 04/11/2013;  b. 04/2014 EGD: 1.  gastric erosions, mild prox gastritis and bulbar duodenitis->Protonix.  . Addison's disease ([place])   . Anemia   . Anemia in CKD (chronic kidney disease) 06/25/2015  . Chronic back pain   . Chronic diastolic CHF (congestive heart failure) ([place])    a. His initial ejection fraction was between 30 and 35%;  b. 04/2013 Echo: EF 50-55% Gr1 DD, mild-mod MR;  c. 04/2014 Echo: EF 60-65%, Gr 1 DD, triv TR.  . CKD (chronic kidney disease), stage III ([place])   . Coronary artery disease    a. 07/2014 - s/p difficult PCI - had pressure wire analysis of LAD s/p DES in mid segment c/b wire-induced dissection in the distal segment of the LAD which was treated with repeated balloon inflations. Also had DES to rPDA - again had either spasm distal to the stent placement or an edge dissection but the vessel was small in that area; procedure aborted.  . Daily headache   . DVT (deep venous thrombosis) ([place])    a. 02/2013 superficial thrombophlebitis --> Xarelto later d/c'd 2/2 GIB;  b. 04/2014 Acute Right PT, Peroneal, Popliteal, Femoral, and common femoral DVT-->coumadin  . GERD (gastroesophageal reflux disease)   . H/O hiatal hernia   . Hepatitis    a. 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  Marland Kitchen  History of aortic insufficiency   . Hx of cardiovascular stress test    a. ETT-Myoview 7/14:  Low risk, inferior defect consistent with thinning, no ischemia, normal wall motion, EF 54%  . Hypertension   . Hypogonadism male   . Obesity   . On home oxygen therapy    a. added 04/2014 in setting of PE.  Marland Kitchen Peptic ulcer disease   . Pulmonary embolism ([place])    a. 04/2014 CTA: Bilat submassive PE ->coumadin.  . Pulmonary HTN ([place])   . Thyroid disease     Past  Surgical History:  Procedure Laterality Date  . APPENDECTOMY  1950's  . CARDIAC CATHETERIZATION  06/26/2008   EF 30-35%  . CARDIAC CATHETERIZATION N/A 08/20/2014   Procedure: Right/Left Heart Cath and Coronary Angiography;  Surgeon: Jolaine Artist, MD;  Location: [place] [facility];  Service: Cardiovascular;  Laterality: N/A;  . CARDIAC CATHETERIZATION N/A 08/21/2014   Procedure: Coronary Stent Intervention;  Surgeon: Wellington Hampshire, MD;  Location: [place] [facility];  Service: Cardiovascular;  Laterality: N/A;  . CARDIAC CATHETERIZATION N/A 11/24/2015   Procedure: Right/Left Heart Cath and Coronary Angiography;  Surgeon: Jolaine Artist, MD;  Location: [place] [facility];  Service: Cardiovascular;  Laterality: N/A;  . CHOLECYSTECTOMY  ~ 1965  . ESOPHAGOGASTRODUODENOSCOPY N/A 04/11/2013   Procedure: ESOPHAGOGASTRODUODENOSCOPY (EGD);  Surgeon: Missy Sabins, MD;  Location: [facility] ENDOSCOPY;  Service: Endoscopy;  Laterality: N/A;  . ESOPHAGOGASTRODUODENOSCOPY N/A 05/13/2014   Procedure: ESOPHAGOGASTRODUODENOSCOPY (EGD);  Surgeon: Arta Silence, MD;  Location: [facility] ENDOSCOPY;  Service: Endoscopy;  Laterality: N/A;  . TONSILLECTOMY  1940's  . US ECHOCARDIOGRAPHY  04/09/2010   EF 60-65%    Social History   Socioeconomic History  . Marital status: Married    Spouse name: Not on file  . Number of children: Not on file  . Years of education: Not on file  . Highest education level: Not on file  Occupational History  . Occupation: retired  Scientific laboratory technician  . Financial resource strain: Not on file  . Food insecurity:    Worry: Not on file    Inability: Not on file  . Transportation needs:    Medical: Not on file    Non-medical: Not on file  Tobacco Use  . Smoking status: Never Smoker  . Smokeless tobacco: Never Used  Substance and Sexual Activity  . Alcohol use: No    Alcohol/week: 0.0 oz  . Drug use: No  . Sexual activity: Not on file  Lifestyle  . Physical activity:    Days per week:  Not on file    Minutes per session: Not on file  . Stress: Not on file  Relationships  . Social connections:    Talks on phone: Not on file    Gets together: Not on file    Attends religious service: Not on file    Active member of club or organization: Not on file    Attends meetings of clubs or organizations: Not on file    Relationship status: Not on file  . Intimate partner violence:    Fear of current or ex partner: Not on file    Emotionally abused: Not on file    Physically abused: Not on file    Forced sexual activity: Not on file  Other Topics Concern  . Not on file  Social History Narrative  . Not on file    No Known Allergies  Family History  Problem Relation Age of  Onset  . Clotting disorder Mother   . Clotting disorder Brother   . Clotting disorder Brother   . Clotting disorder Brother   . Clotting disorder Other        Niece.  Marland Kitchen Heart attack Neg Hx     Prior to Admission medications   Medication Sig Start Date End Date Taking? Authorizing Provider  blood glucose meter kit and supplies KIT Dispense based on patient and insurance preference. Use up to four times daily as directed. (FOR ICD-9 250.00, 250.01). 04/25/17   Elwyn Reach, MD  CALCIUM PO Take by mouth.    [provider]  carvedilol (COREG) 6.25 MG tablet Take 1 tablet (6.25 mg total) by mouth 2 (two) times daily with a meal. 01/26/17   Larey Dresser, MD  Cholecalciferol (VITAMIN D3 PO) Take 1,000 capsules by mouth daily.     [provider]  folic acid (FOLVITE) 222 MCG tablet Take 400 mcg by mouth daily.    [provider]  furosemide (LASIX) 40 MG tablet Take 20 mg by mouth 2 (two) times daily.  01/25/17   Shelly Coss, MD  gabapentin (NEURONTIN) 600 MG tablet Take 600 mg by mouth 3 (three) times daily.     [provider]  hydrocortisone (CORTEF) 20 MG tablet Take 20 mg by mouth. Taking three times daily 46m/10mg/20mg    [provider]    ibuprofen (ADVIL,MOTRIN) 200 MG tablet Take 400 mg by mouth at bedtime as needed (PAIN).    [provider]  Insulin Glargine (LANTUS) 100 UNIT/ML Solostar Pen Inject 20 Units into the skin daily at 10 pm. 04/25/17   GElwyn Reach MD  Insulin Pen Needle 31G X 5 MM MISC Use with Lantus Pen 04/25/17   GElwyn Reach MD  isosorbide mononitrate (IMDUR) 30 MG 24 hr tablet Take 0.5 tablets (15 mg total) by mouth daily. 09/05/14   WRichardson DoppT, PA-C  living well with diabetes book MISC 1 book 04/25/17   GElwyn Reach MD  losartan (COZAAR) 25 MG tablet Take 25 mg by mouth daily. 01/10/17   [provider]  MAGNESIUM PO Take by mouth.    [provider]  metFORMIN (GLUCOPHAGE) 500 MG tablet Take 1 tablet (500 mg total) by mouth 2 (two) times daily with a meal. 04/25/17 04/25/18  GElwyn Reach MD  metoCLOPramide (REGLAN) 5 MG tablet Take 5 mg by mouth daily.     [provider]  Multiple Vitamins-Minerals (ZINC PO) Take by mouth.    [provider]  nitroGLYCERIN (NITROSTAT) 0.4 MG SL tablet PLACE 1 TABLET UNDER TONGUE EVERY 5 MINUTES AS NEEDED FOR CHEST PAIN (UP TO 3 DOSES) 03/22/16   [name], PWonda Cheng MD  pantoprazole (PROTONIX) 40 MG tablet Take 40 mg by mouth daily. 12/10/16   [provider]  potassium chloride (K-DUR,KLOR-CON) 10 MEQ tablet Take 10 mEq by mouth daily.     [provider]  PROAIR HFA 108 (90 Base) MCG/ACT inhaler INHALE 2 PUFFS INTO THE LUNGS EVERY 6 (SIX) HOURS AS NEEDED FOR WHEEZING OR SHORTNESS OF BREATH. 07/08/15   BCollene Gobble MD  promethazine (PHENERGAN) 25 MG tablet Take 25 mg by mouth every 6 (six) hours as needed for nausea.  05/17/14   [provider]  thyroid (ARMOUR) 60 MG tablet Take 60 mg by mouth daily.     [provider]  traMADol (ULTRAM) 50 MG tablet Take 100 mg by mouth 2 (  two) times daily.     [provider]  traZODone (DESYREL) 100 MG tablet Take 100 mg by mouth  every evening.  04/16/14   [provider]  vitamin B-12 (CYANOCOBALAMIN) 1000 MCG tablet Take 1,000 mcg by mouth daily.    [provider]  VITAMIN E PO Take by mouth.    [provider]  warfarin (COUMADIN) 3 MG tablet Take 1/2 tab to 1 tab daily or as directed by [facility] 04/19/17   [name], Wonda Cheng, MD  warfarin (COUMADIN) 3 MG tablet TAKE AS DIRECTED BY [facility] 07/01/17   [name], Wonda Cheng, MD    Physical Exam: Vitals:   07/07/17 1030 07/07/17 1045 07/07/17 1145 07/07/17 1200  BP: 110/67 104/69 112/61 118/88  Pulse: 79 74 77 75  Resp: _0 Temp:      TempSrc:      SpO2: 97% 98% 96% 99%     General:  Appears calm and comfortable sitting on side of bed no acute distress Eyes:  PERRL, EOMI, normal lids, iris ENT:  grossly normal hearing, lips & tongue, mucous membranes of his mouth are pink only slightly dry Neck:  no LAD, masses or thyromegaly Cardiovascular:  RRR, no m/r/g. 1+ pedal edema on left foot and leg with mild erythema and mild warmth, trace LE edema on right.  Respiratory:  Mild increased work of breathing with conversation. Breath sounds are distant but clear Abdomen:  soft, ntnd,positive bowel sounds throughout no guarding or rebounding Skin:  no rash or induration seen on limited exam Musculoskeletal:  grossly normal tone BUE/BLE, good ROM, no bony abnormality Psychiatric:  grossly normal mood and affect, speech fluent and appropriate, AOx3 Neurologic:  Patient oriented to self and place only. Able to follow commands. Able to make his wants and needs known. Speech clear facial symmetry tongue midline bilateral grip 4 out of 4  Labs on Admission: I have personally reviewed following labs and imaging studies  CBC: Recent Labs  Lab 07/07/17 0750  WBC 16.7*  HGB 13.1  HCT 42.6  MCV 81.8  PLT 854*   Basic Metabolic Panel: Recent Labs  Lab 07/07/17 0750  NA 137  K 3.1*  CL 104  CO2 18*  GLUCOSE 153*  BUN 30*    CREATININE 2.10*  CALCIUM 8.4*   GFR: CrCl cannot be calculated (Unknown ideal weight.). Liver Function Tests: Recent Labs  Lab 07/07/17 0853  AST 36  ALT 50  ALKPHOS 76  BILITOT 0.5  PROT 6.6  ALBUMIN 3.1*   No results for input(s): LIPASE, AMYLASE in the last 168 hours. No results for input(s): AMMONIA in the last 168 hours. Coagulation Profile: Recent Labs  Lab 07/01/17 0952 07/07/17 0840  INR 1.4* 1.65   Cardiac Enzymes: No results for input(s): CKTOTAL, CKMB, CKMBINDEX, TROPONINI in the last 168 hours. BNP (last 3 results) No results for input(s): PROBNP in the last 8760 hours. HbA1C: No results for input(s): HGBA1C in the last 72 hours. CBG: No results for input(s): GLUCAP in the last 168 hours. Lipid Profile: No results for input(s): CHOL, HDL, LDLCALC, TRIG, CHOLHDL, LDLDIRECT in the last 72 hours. Thyroid Function Tests: No results for input(s): TSH, T4TOTAL, FREET4, T3FREE, THYROIDAB in the last 72 hours. Anemia Panel: No results for input(s): VITAMINB12, FOLATE, FERRITIN, TIBC, IRON, RETICCTPCT in the last 72 hours. Urine analysis:    Component Value Date/Time   COLORURINE YELLOW 04/22/2017 1649   APPEARANCEUR HAZY (A) 04/22/2017 1649  LABSPEC 1.014 04/22/2017 1649   PHURINE 6.0 04/22/2017 1649   GLUCOSEU >=500 (A) 04/22/2017 1649   HGBUR NEGATIVE 04/22/2017 1649   BILIRUBINUR NEGATIVE 04/22/2017 1649   KETONESUR NEGATIVE 04/22/2017 1649   PROTEINUR NEGATIVE 04/22/2017 1649   UROBILINOGEN 0.2 03/21/2007 1015   NITRITE NEGATIVE 04/22/2017 1649   LEUKOCYTESUR NEGATIVE 04/22/2017 1649    Creatinine Clearance: CrCl cannot be calculated (Unknown ideal weight.).  Sepsis Labs: _0 (procalcitonin:4,lacticidven:4) )No results found for this or any previous visit (from the past 240 hour(s)).   Radiological Exams on Admission: Dg Chest 2 View  Result Date: 07/07/2017 CLINICAL DATA:  Shortness of breath, dizziness, weakness, RIGHT rib pain,  history chronic diastolic CHF, Addison's disease, stage III chronic kidney disease, coronary artery disease, pulmonary hypertension, prior pulmonary embolism, hypertension EXAM: CHEST - 2 VIEW COMPARISON:  04/22/2017 FINDINGS: Normal heart size, mediastinal contours, and pulmonary vascularity. Atherosclerotic calcification aorta. Chronic atelectasis versus scarring at RIGHT base. Minimal bibasilar atelectasis versus scarring unchanged. No acute infiltrate, pleural effusion or pneumothorax. IMPRESSION: Minimal bibasilar atelectasis versus scarring. No acute abnormalities. Electronically Signed   By: Lavonia Dana M.D.   On: 07/07/2017 08:05   Nm Pulmonary Vent And Perf (v/q Scan)  Result Date: 07/07/2017 CLINICAL DATA:  Shortness of Breath EXAM: NUCLEAR MEDICINE VENTILATION - PERFUSION LUNG SCAN VIEWS: Anterior, posterior, left lateral, right lateral, RPO, LPO, RAO, LAO-ventilation and perfusion RADIOPHARMACEUTICALS:  32.6 mCi of Tc-2mDTPA aerosol inhalation and 4.2 mCi Tc94mAA IV COMPARISON:  Chest radiograph July 07, 2017; ventilation perfusion lung scan examination May 31, 2016 FINDINGS: Ventilation: Radiotracer uptake bilaterally is homogeneous and symmetric. No ventilation defects are identified. Perfusion: Radiotracer uptake bilaterally is homogeneous and symmetric. No perfusion defects are identified. IMPRESSION: No appreciable ventilation or perfusion defects. No appreciable change from prior study. This study constitutes a very low probability of pulmonary embolus. Electronically Signed   By: WiLowella GripII M.D.   On: 07/07/2017 14:12    EKG: Independently reviewed. Sinus rhythm Borderline ST depression, lateral leads  Assessment/Plan Principal Problem:   Cellulitis Active Problems:   Chronic respiratory failure (HCC)   Acute kidney injury superimposed on chronic kidney disease (HCC)   Addison disease (HCC)   Benign essential HTN   Chronic diastolic CHF (congestive heart failure)  (HCC)   Chronic anticoagulation   Coronary artery disease involving native coronary artery of native heart without angina pectoris   Diabetes mellitus without complication ([place]  Dementia   Respiratory distress   #1. Cellulitis. Left lower extremity with swelling erythema warmth. Preliminary USKoreaeveals no DVT. He is on Coumadin for history of DVT and PE. He is afebrile hemodynamically stable and not hypoxic. He does have a leukocytosis but is on chronic steroids for Addison's disease. Zosyn was initiated in the emergency department. -admit -obtain blood cultures -follow cellulitis protocol -antibiotics per protocol  #2. Respiratory distress/chronic respiratory failure. Patient complains 2 week history worsening shortness of breath. please note patient is noncompliant with oxygen supplementation. He supposed to wear oxygen at all times and does not. Wife is not prepared to say shortness of breath is worse than usual. Oxygen saturation level was 70% on room air with ambulation. This x-ray with minimal by basilar atelectasis versus scarring no acute abnormalities. Saturation level greater than 90% on 2 L. -provide oxygen supplementation -monitor oxygen saturation level -patient to be discharged with oxygen he already hassupplies at home  #3. Chronic diastolic heart failure. Echo in March of this year reveals an EF of 50%  normal systolic function grade 1 diastolic dysfunction. does not appear overloaded.home medications include Coreg, Lasix, losartan, -will continue Coreg -Hold Lasix until tomorrow -Hold losartan can dairy to acute kidney injury superimposed on chronic kidney disease -Obtain daily weights -Monitor intake and output  #4. Acute kidney injury superimposed on chronic kidney disease stage III. Creatinine 2.1 on admission. It appears baseline closer to 1.3 range. Related to decrease oral intake in setting of lasix -hold nephrotoxins (lasix and ARB) -gentle IV fluids -monitor urine  output -recheck in am  #5.hypertension.controlled in the emergency department. Home medications as noted above. -Continue Coreg -Lasix tomorrow -Hold ARB for now -monitor  #6. Diabetes.serum glucose 153. Home medications include Lantus and metformin.  -hold metformin for now -obtain hemoglobin A1c -Continue Lantus -Sliding scale insulin for optimal control  7. CAD status post PCI 2016. No chest pain. EKG as noted above. -Continue home meds  #8. Addison's disease. Stable at baseline -continue home med  #9. Dementia. Patient unable to state month. Wife states he has "cognitive issues" that have been worsening over the last year. Query chronic hypoxemia. 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  -OP follow up  #10. History of DVT/PE. Home medications include Coumadin. INR 1.6 at time of admission -Coumadin per pharmacy   DVT prophylaxis: coumadin Code Status: full  Family Communication: wife at bedside  Disposition Plan: home  Consults called: none  Admission status: inpatient    Radene Gunning MD [facility]  If 7PM-7AM, please contact night-coverage [email or web address] Password [facility]  07/07/2017, 3:36 PM

## 2017-07-08 ENCOUNTER — Other Ambulatory Visit: Payer: Self-pay

## 2017-07-08 DIAGNOSIS — I10 Essential (primary) hypertension: Secondary | ICD-10-CM

## 2017-07-08 DIAGNOSIS — N179 Acute kidney failure, unspecified: Secondary | ICD-10-CM

## 2017-07-08 DIAGNOSIS — E271 Primary adrenocortical insufficiency: Secondary | ICD-10-CM

## 2017-07-08 DIAGNOSIS — N189 Chronic kidney disease, unspecified: Secondary | ICD-10-CM

## 2017-07-08 DIAGNOSIS — L03116 Cellulitis of left lower limb: Principal | ICD-10-CM

## 2017-07-08 DIAGNOSIS — Z7901 Long term (current) use of anticoagulants: Secondary | ICD-10-CM

## 2017-07-08 LAB — CBC
HCT: 41.7 % (ref 39.0–52.0)
Hemoglobin: 12.6 g/dL — ABNORMAL LOW (ref 13.0–17.0)
MCH: 24.9 pg — ABNORMAL LOW (ref 26.0–34.0)
MCHC: 30.2 g/dL (ref 30.0–36.0)
MCV: 82.4 fL (ref 78.0–100.0)
Platelets: 363 10*3/uL (ref 150–400)
RBC: 5.06 MIL/uL (ref 4.22–5.81)
RDW: 17.8 % — AB (ref 11.5–15.5)
WBC: 14.1 10*3/uL — ABNORMAL HIGH (ref 4.0–10.5)

## 2017-07-08 LAB — PROTIME-INR
INR: 1.81
PROTHROMBIN TIME: 20.8 s — AB (ref 11.4–15.2)

## 2017-07-08 LAB — BASIC METABOLIC PANEL
Anion gap: 10 (ref 5–15)
BUN: 25 mg/dL — ABNORMAL HIGH (ref 6–20)
CALCIUM: 8.2 mg/dL — AB (ref 8.9–10.3)
CO2: 21 mmol/L — AB (ref 22–32)
CREATININE: 1.91 mg/dL — AB (ref 0.61–1.24)
Chloride: 105 mmol/L (ref 101–111)
GFR calc non Af Amer: 32 mL/min — ABNORMAL LOW (ref >60)
GFR, EST AFRICAN AMERICAN: 38 mL/min — AB (ref >60)
Glucose, Bld: 98 mg/dL (ref 65–99)
Potassium: 3.5 mmol/L (ref 3.5–5.1)
SODIUM: 136 mmol/L (ref 135–145)

## 2017-07-08 LAB — GLUCOSE, CAPILLARY
GLUCOSE-CAPILLARY: 97 mg/dL (ref 65–99)
GLUCOSE-CAPILLARY: 137 mg/dL — AB (ref 65–99)
GLUCOSE-CAPILLARY: 142 mg/dL — AB (ref 65–99)
Glucose-Capillary: 197 mg/dL — ABNORMAL HIGH (ref 65–99)

## 2017-07-08 MED ORDER — ALBUTEROL SULFATE (2.5 MG/3ML) 0.083% IN NEBU
3.0000 mL | INHALATION_SOLUTION | Freq: Three times a day (TID) | RESPIRATORY_TRACT | Status: DC
  Administered 2017-07-08 – 2017-07-10 (×4): 3 mL via RESPIRATORY_TRACT
  Filled 2017-07-08 (×4): qty 3

## 2017-07-08 MED ORDER — WARFARIN SODIUM 3 MG PO TABS
3.0000 mg | ORAL_TABLET | Freq: Once | ORAL | Status: AC
  Administered 2017-07-08: 3 mg via ORAL
  Filled 2017-07-08: qty 1

## 2017-07-08 MED ORDER — ENSURE ENLIVE PO LIQD
237.0000 mL | Freq: Two times a day (BID) | ORAL | Status: DC
  Administered 2017-07-08 – 2017-07-11 (×5): 237 mL via ORAL

## 2017-07-08 NOTE — Progress Notes (Signed)
Initial Nutrition Assessment  DOCUMENTATION CODES:   Not applicable  INTERVENTION:    Ensure Enlive po BID, each supplement provides 350 kcal and 20 grams of protein  NUTRITION DIAGNOSIS:   Increased nutrient needs related to chronic illness as evidenced by estimated needs  GOAL:   Patient will meet greater than or equal to 90% of their needs  MONITOR:   PO intake, Supplement acceptance, Labs, Skin, Weight trends, I & O's  REASON FOR ASSESSMENT:   Malnutrition Screening Tool  ASSESSMENT:   77 yo Male with PMH of Addison's disease, CKD stage III, CAD post PCI in 2017, prior DVT and PE, chronic diastolic CHF, chronic respiratory failure on home oxygen, presented with chief complaint of swelling and redness in his left leg as well as shortness of breath.  Pt sleepy upon RD visit. Eyes opening and closing. Reports his appetite is "so-so". No % PO intake records available. When asking pt if he had lost weight pt replied "Yes, they told me I did".  He was agreeable to receiving nutrition supplements during acute stay. Medications include reglan, vitamin D, vitamin B-12 and folvite. Labs reviewed. CBG's [id number].  NUTRITION - FOCUSED PHYSICAL EXAM:  Completed. No muscle or fat depletions noticed.  Diet Order:   Diet Order           Diet heart healthy/carb modified Room service appropriate? Yes; Fluid consistency: Thin  Diet effective now         EDUCATION NEEDS:   No education needs have been identified at this time  Skin:  Skin Assessment: Reviewed RN Assessment  Last BM:  6/13  Height:   Ht Readings from Last 1 Encounters:  07/07/17 5\' 10"  (1.778 m)   Weight:   Wt Readings from Last 1 Encounters:  07/08/17 206 lb 5.6 oz (93.6 kg)   Ideal Body Weight:  75.4 kg  BMI:  Body mass index is 29.61 kg/m.  Estimated Nutritional Needs:   Kcal:  2100-2300  Protein:  100-115 gm  Fluid:  2.1-2.3 L  Arthur Holms, RD, LDN Pager #:  [phone] After-Hours Pager #: [phone]

## 2017-07-08 NOTE — Discharge Instructions (Addendum)
Follow with Lawerance Cruel, MD in 5-7 days  Please get a complete blood count and chemistry panel checked by your Primary MD at your next visit, and again as instructed by your Primary MD. Please get your medications reviewed and adjusted by your Primary MD.  Please request your Primary MD to go over all Hospital Tests and Procedure/Radiological results at the follow up, please get all Hospital records sent to your Prim MD by signing hospital release before you go home.  If you had Pneumonia of Lung problems at the Hospital: Please get a 2 view Chest X ray done in 6-8 weeks after hospital discharge or sooner if instructed by your Primary MD.  If you have Congestive Heart Failure: Please call your Cardiologist or Primary MD anytime you have any of the following symptoms:  1) 3 pound weight gain in 24 hours or 5 pounds in 1 week  2) shortness of breath, with or without a dry hacking cough  3) swelling in the hands, feet or stomach  4) if you have to sleep on extra pillows at night in order to breathe  Follow cardiac low salt diet and 1.5 lit/day fluid restriction.  If you have diabetes Accuchecks 4 times/day, Once in AM empty stomach and then before each meal. Log in all results and show them to your primary doctor at your next visit. If any glucose reading is under 80 or above 300 call your primary MD immediately.  If you have Seizure/Convulsions/Epilepsy: Please do not drive, operate heavy machinery, participate in activities at heights or participate in high speed sports until you have seen by Primary MD or a Neurologist and advised to do so again.  If you had Gastrointestinal Bleeding: Please ask your Primary MD to check a complete blood count within one week of discharge or at your next visit. Your endoscopic/colonoscopic biopsies that are pending at the time of discharge, will also need to followed by your Primary MD.  Get Medicines reviewed and adjusted. Please take all your  medications with you for your next visit with your Primary MD  Please request your Primary MD to go over all hospital tests and procedure/radiological results at the follow up, please ask your Primary MD to get all Hospital records sent to his/her office.  If you experience worsening of your admission symptoms, develop shortness of breath, life threatening emergency, suicidal or homicidal thoughts you must seek medical attention immediately by calling 911 or calling your MD immediately  if symptoms less severe.  You must read complete instructions/literature along with all the possible adverse reactions/side effects for all the Medicines you take and that have been prescribed to you. Take any new Medicines after you have completely understood and accpet all the possible adverse reactions/side effects.   Do not drive or operate heavy machinery when taking Pain medications.   Do not take more than prescribed Pain, Sleep and Anxiety Medications  Special Instructions: If you have smoked or chewed Tobacco  in the last 2 yrs please stop smoking, stop any regular Alcohol  and or any Recreational drug use.  Wear Seat belts while driving.  Please note You were cared for by a hospitalist during your hospital stay. If you have any questions about your discharge medications or the care you received while you were in the hospital after you are discharged, you can call the unit and asked to speak with the hospitalist on call if the hospitalist that took care of you is not available.  Once you are discharged, your primary care physician will handle any further medical issues. Please note that NO REFILLS for any discharge medications will be authorized once you are discharged, as it is imperative that you return to your primary care physician (or establish a relationship with a primary care physician if you do not have one) for your aftercare needs so that they can reassess your need for medications and monitor your  lab values.  You can reach the hospitalist office at phone [phone] or fax [phone]   If you do not have a primary care physician, you can call [phone] for a physician referral.  Activity: As tolerated with Full fall precautions use walker/cane & assistance as needed  Diet: heart healthy  Disposition Home     Information on my medicine - Coumadin   (Warfarin)  This medication education was reviewed with me or my healthcare representative as part of my discharge preparation.  The pharmacist that spoke with me during my hospital stay was:  Saundra Shelling, [facility]  Why was Coumadin prescribed for you? Coumadin was prescribed for you because you have a blood clot or a medical condition that can cause an increased risk of forming blood clots. Blood clots can cause serious health problems by blocking the flow of blood to the heart, lung, or brain. Coumadin can prevent harmful blood clots from forming. As a reminder your indication for Coumadin is:   Pulmonary Embolism Treatmentand DVT  What test will check on my response to Coumadin? While on Coumadin (warfarin) you will need to have an INR test regularly to ensure that your dose is keeping you in the desired range. The INR (international normalized ratio) number is calculated from the result of the laboratory test called prothrombin time (PT).  If an INR APPOINTMENT HAS NOT ALREADY BEEN MADE FOR YOU please schedule an appointment to have this lab work done by your health care provider within 7 days. Your INR goal is usually a number between:  2 to 3 or your provider may give you a more narrow range like 2-2.5.  Ask your health care provider during an office visit what your goal INR is.  What  do you need to  know  About  COUMADIN? Take Coumadin (warfarin) exactly as prescribed by your healthcare provider about the same time each day.  DO NOT stop taking without talking to the doctor who prescribed the medication.  Stopping without  other blood clot prevention medication to take the place of Coumadin may increase your risk of developing a new clot or stroke.  Get refills before you run out.  What do you do if you miss a dose? If you miss a dose, take it as soon as you remember on the same day then continue your regularly scheduled regimen the next day.  Do not take two doses of Coumadin at the same time.  Important Safety Information A possible side effect of Coumadin (Warfarin) is an increased risk of bleeding. You should call your healthcare provider right away if you experience any of the following: ? Bleeding from an injury or your nose that does not stop. ? Unusual colored urine (red or dark brown) or unusual colored stools (red or black). ? Unusual bruising for unknown reasons. ? A serious fall or if you hit your head (even if there is no bleeding).  Some foods or medicines interact with Coumadin (warfarin) and might alter your response to warfarin. To help avoid this: ? Eat a  balanced diet, maintaining a consistent amount of Vitamin K. ? Notify your provider about major diet changes you plan to make. ? Avoid alcohol or limit your intake to 1 drink for women and 2 drinks for men per day. (1 drink is 5 oz. wine, 12 oz. beer, or 1.5 oz. liquor.)  Make sure that ANY health care provider who prescribes medication for you knows that you are taking Coumadin (warfarin).  Also make sure the healthcare provider who is monitoring your Coumadin knows when you have started a new medication including herbals and non-prescription products.  Coumadin (Warfarin)  Major Drug Interactions  Increased Warfarin Effect Decreased Warfarin Effect  Alcohol (large quantities) Antibiotics (esp. Septra/Bactrim, Flagyl, Cipro) Amiodarone (Cordarone) Aspirin (ASA) Cimetidine (Tagamet) Megestrol (Megace) NSAIDs (ibuprofen, naproxen, etc.) Piroxicam (Feldene) Propafenone (Rythmol SR) Propranolol (Inderal) Isoniazid (INH) Posaconazole  (Noxafil) Barbiturates (Phenobarbital) Carbamazepine (Tegretol) Chlordiazepoxide (Librium) Cholestyramine (Questran) Griseofulvin Oral Contraceptives Rifampin Sucralfate (Carafate) Vitamin K   Coumadin (Warfarin) Major Herbal Interactions  Increased Warfarin Effect Decreased Warfarin Effect  Garlic Ginseng Ginkgo biloba Coenzyme Q10 Green tea St. Johns wort    Coumadin (Warfarin) FOOD Interactions  Eat a consistent number of servings per week of foods HIGH in Vitamin K (1 serving =  cup)  Collards (cooked, or boiled & drained) Kale (cooked, or boiled & drained) Mustard greens (cooked, or boiled & drained) Parsley *serving size only =  cup Spinach (cooked, or boiled & drained) Swiss chard (cooked, or boiled & drained) Turnip greens (cooked, or boiled & drained)  Eat a consistent number of servings per week of foods MEDIUM-HIGH in Vitamin K (1 serving = 1 cup)  Asparagus (cooked, or boiled & drained) Broccoli (cooked, boiled & drained, or raw & chopped) Brussel sprouts (cooked, or boiled & drained) *serving size only =  cup Lettuce, raw (green leaf, endive, romaine) Spinach, raw Turnip greens, raw & chopped   These websites have more information on Coumadin (warfarin):  FailFactory.se; VeganReport.com.au;

## 2017-07-08 NOTE — Care Management Note (Signed)
Case Management Note  Patient Details  Name: [name] MRN: [id number] Date of Birth: 1940/11/30  Subjective/Objective:  77 yr old gentleman admitted with hypoxia, cellulitis of left lower leg. Has HX of diastolic heart failure.          Action/Plan: Case manager spoke with patient and his family concerning discharge plan and DME needs. Patient is adamant that he will return home, not considering a SNF. Family and patient request a hospital bed, he can not breathe laying flat and has difficulty raising up. Patient also needs a portable oxygen tank for travel. Choice for [place] was offered, patient's wife sates they have used [facility] and would like to do so now, they also want same therapist. Case manager gave this request to [place], Liaison with [facility]. Patient will have family support at discharge.     Expected Discharge Date:    07/09/17              Expected Discharge Plan:  [place]  In-House Referral:  NA  Discharge planning Services  CM Consult  Post Acute Care Choice:  Home Health Choice offered to:  Patient, Spouse, Adult Children  DME Arranged:  Walker rolling, Hospital bed, Oxygen DME Agency:  [place]:  PT, OT, Social Work [employer] Agency:  [facility] [employer]  Status of Service:  Completed, signed off  If discussed at [employer], dates discussed:    Additional Comments:  Dennis Meeker, RN 07/08/2017, 1:39 PM

## 2017-07-08 NOTE — Progress Notes (Signed)
PROGRESS NOTE  CHARLENE COWDREY DQQ:[id number] DOB: [birth year] DOA: 07/07/2017 PCP: Lawerance Cruel, MD   LOS: 1 day   Brief Narrative / Interim history: 77 year old male with history of Addison's disease, chronic kidney disease stage III, coronary artery disease post PCI in 2017, prior DVT and PE, chronic diastolic CHF, chronic respiratory failure on home oxygen, presents with chief complaint of swelling and redness in his left leg as well as shortness of breath.  Patient is noncompliant with his oxygen, initial oxygen saturation on room air was 70%.  Assessment & Plan: Principal Problem:   Cellulitis Active Problems:   Addison disease ([place])   Benign essential HTN   Chronic respiratory failure (HCC)   Chronic diastolic CHF (congestive heart failure) (HCC)   Chronic anticoagulation   Coronary artery disease involving native coronary artery of native heart without angina pectoris   Diabetes mellitus without complication (HCC)   Respiratory distress   Acute kidney injury superimposed on chronic kidney disease (HCC)   Dementia   Left lower extremity cellulitis/swelling -DVT ultrasound in the emergency room did not show any DVT.  He is on Coumadin for recurrent DVTs and PE, continue. -He was placed on ceftriaxone, his cellulitis has significantly improved today, continue as he is responding -Blood cultures remain negative  Chronic hypoxic respiratory failure -Supposed to be on 3 L nasal cannula at home, however he has been noncompliant with his home oxygen since he does not have small portable unit -Chest x-ray with minimal bibasilar atelectasis without any acute abnormalities. -His oxygen saturation is above 90% on 3 L  Chronic diastolic heart failure -He underwent a 2D echo in March 2019 which showed an EF of 50% with grade 1 diastolic dysfunction -He was quite dehydrated when he came in, gentle hydration and hold Lasix -Continue Coreg -Hold losartan, will discontinue on  discharge  Acute kidney injury superimposed on chronic kidney disease stage III -Baseline creatinine varies between 1.3-1.8, currently at 1.9 close to baseline.  Repeat in the morning.  Hold Lasix and ARB  HTN -Continue Coreg, monitor  DM -Need to hold metformin on discharge, continue Lantus and sliding scale  CAD status post PCI 2016  -No chest pain.  Addison's disease  -Stable at baseline, continue steroids  Mild dementia  -on admission patient unable to state month. Wife states he has "cognitive issues" that have been worsening over the last year. Query chronic hypoxemia. 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   History of DVT/PE -Continue Coumadin, ultrasound without DVT, VQ scan without ventilation or perfusion defects    DVT prophylaxis: Coumadin Code Status: Full code Family Communication: wife, son and daughter-in-law at bedside Disposition Plan: home in 1 day  Consultants:   None   Procedures:   None   Antimicrobials:  Ceftriaxone 6/13 >>   Subjective: - no chest pain, shortness of breath, no abdominal pain, nausea or vomiting.  Wants to go home  Objective: Vitals:   07/08/17 0500 07/08/17 1023 07/08/17 1321 07/08/17 1357  BP:  114/72 110/73   Pulse:  80 88 87  Resp:  20 18 (!) 22  Temp:  98.4 F (36.9 C) 98.2 F (36.8 C)   TempSrc:  Oral Oral   SpO2:  95% 97% 95%  Weight: 93.6 kg (206 lb 5.6 oz)     Height:        Intake/Output Summary (Last 24 hours) at 07/08/2017 1450 Last data filed at 07/08/2017 1041 Gross per 24 hour  Intake 960.84 ml  Output 980 ml  Net -19.16 ml   Filed Weights   07/08/17 0500  Weight: 93.6 kg (206 lb 5.6 oz)    Examination:  Constitutional: NAD Eyes:  lids and conjunctivae normal ENMT: Mucous membranes are moist.  Neck: normal, supple Respiratory: clear to auscultation bilaterally, no wheezing, no crackles. Normal respiratory effort. No accessory muscle use.   Overall decreased breath sounds Cardiovascular: Regular rate and rhythm, no murmurs / rubs / gallops.  Trace LE edema on the left more than the right. 2+ pedal pulses. Abdomen: no tenderness. Bowel sounds positive.  Skin: Cellulitic changes left lower extremity almost resolved Neurologic: CN 2-12 grossly intact. Strength 5/5 in all 4.    Data Reviewed: I have independently reviewed following labs and imaging studies   CBC: Recent Labs  Lab 07/07/17 0750 07/08/17 0508  WBC 16.7* 14.1*  HGB 13.1 12.6*  HCT 42.6 41.7  MCV 81.8 82.4  PLT 421* 976   Basic Metabolic Panel: Recent Labs  Lab 07/07/17 0750 07/07/17 1942 07/08/17 0508  NA 137  --  136  K 3.1*  --  3.5  CL 104  --  105  CO2 18*  --  21*  GLUCOSE 153*  --  98  BUN 30*  --  25*  CREATININE 2.10*  --  1.91*  CALCIUM 8.4*  --  8.2*  MG  --  2.2  --    GFR: Estimated Creatinine Clearance: 37.8 mL/min (A) (by C-G formula based on SCr of 1.91 mg/dL (H)). Liver Function Tests: Recent Labs  Lab 07/07/17 0853  AST 36  ALT 50  ALKPHOS 76  BILITOT 0.5  PROT 6.6  ALBUMIN 3.1*   No results for input(s): LIPASE, AMYLASE in the last 168 hours. No results for input(s): AMMONIA in the last 168 hours. Coagulation Profile: Recent Labs  Lab 07/07/17 0840 07/08/17 0508  INR 1.65 1.81   Cardiac Enzymes: No results for input(s): CKTOTAL, CKMB, CKMBINDEX, TROPONINI in the last 168 hours. BNP (last 3 results) No results for input(s): PROBNP in the last 8760 hours. HbA1C: No results for input(s): HGBA1C in the last 72 hours. CBG: Recent Labs  Lab 07/07/17 1843 07/07/17 2241 07/08/17 0614 07/08/17 1224  GLUCAP 125* 98 97 137*   Lipid Profile: No results for input(s): CHOL, HDL, LDLCALC, TRIG, CHOLHDL, LDLDIRECT in the last 72 hours. Thyroid Function Tests: No results for input(s): TSH, T4TOTAL, FREET4, T3FREE, THYROIDAB in the last 72 hours. Anemia Panel: No results for input(s): VITAMINB12, FOLATE, FERRITIN,  TIBC, IRON, RETICCTPCT in the last 72 hours. Urine analysis:    Component Value Date/Time   COLORURINE YELLOW 04/22/2017 1649   APPEARANCEUR HAZY (A) 04/22/2017 1649   LABSPEC 1.014 04/22/2017 1649   PHURINE 6.0 04/22/2017 1649   GLUCOSEU >=500 (A) 04/22/2017 1649   HGBUR NEGATIVE 04/22/2017 1649   BILIRUBINUR NEGATIVE 04/22/2017 1649   KETONESUR NEGATIVE 04/22/2017 1649   PROTEINUR NEGATIVE 04/22/2017 1649   UROBILINOGEN 0.2 03/21/2007 1015   NITRITE NEGATIVE 04/22/2017 1649   LEUKOCYTESUR NEGATIVE 04/22/2017 1649   Sepsis Labs: Invalid input(s): PROCALCITONIN, LACTICIDVEN  Recent Results (from the past 240 hour(s))  Culture, blood (routine x 2)     Status: None (Preliminary result)   Collection Time: 07/07/17  7:40 PM  Result Value Ref Range Status   Specimen Description BLOOD RIGHT HAND  Final   Special Requests   Final    BOTTLES DRAWN AEROBIC AND ANAEROBIC Blood Culture adequate volume  Culture   Final    NO GROWTH < 24 HOURS Performed at [facility], [place] [address] [place], [place] [address]    Report Status PENDING  Incomplete  Culture, blood (routine x 2)     Status: None (Preliminary result)   Collection Time: 07/07/17  8:01 PM  Result Value Ref Range Status   Specimen Description BLOOD LEFT HAND  Final   Special Requests   Final    BOTTLES DRAWN AEROBIC AND ANAEROBIC Blood Culture results may not be optimal due to an inadequate volume of blood received in culture bottles   Culture   Final    NO GROWTH < 24 HOURS Performed at [facility], [place] [address], [place], [place] [address]    Report Status PENDING  Incomplete      Radiology Studies: Dg Chest 2 View  Result Date: 07/07/2017 CLINICAL DATA:  Shortness of breath, dizziness, weakness, RIGHT rib pain, history chronic diastolic CHF, Addison's disease, stage III chronic kidney disease, coronary artery disease, pulmonary hypertension, prior pulmonary embolism, hypertension EXAM: CHEST - 2 VIEW  COMPARISON:  04/22/2017 FINDINGS: Normal heart size, mediastinal contours, and pulmonary vascularity. Atherosclerotic calcification aorta. Chronic atelectasis versus scarring at RIGHT base. Minimal bibasilar atelectasis versus scarring unchanged. No acute infiltrate, pleural effusion or pneumothorax. IMPRESSION: Minimal bibasilar atelectasis versus scarring. No acute abnormalities. Electronically Signed   By: Lavonia Dana M.D.   On: 07/07/2017 08:05   Nm Pulmonary Vent And Perf (v/q Scan)  Result Date: 07/07/2017 CLINICAL DATA:  Shortness of Breath EXAM: NUCLEAR MEDICINE VENTILATION - PERFUSION LUNG SCAN VIEWS: Anterior, posterior, left lateral, right lateral, RPO, LPO, RAO, LAO-ventilation and perfusion RADIOPHARMACEUTICALS:  32.6 mCi of Tc-53m DTPA aerosol inhalation and 4.2 mCi Tc1m-MAA IV COMPARISON:  Chest radiograph July 07, 2017; ventilation perfusion lung scan examination May 31, 2016 FINDINGS: Ventilation: Radiotracer uptake bilaterally is homogeneous and symmetric. No ventilation defects are identified. Perfusion: Radiotracer uptake bilaterally is homogeneous and symmetric. No perfusion defects are identified. IMPRESSION: No appreciable ventilation or perfusion defects. No appreciable change from prior study. This study constitutes a very low probability of pulmonary embolus. Electronically Signed   By: Lowella Grip III M.D.   On: 07/07/2017 14:12     Scheduled Meds: . albuterol  3 mL Inhalation TID  . carvedilol  6.25 mg Oral BID WC  . cholecalciferol  1,000 Units Oral Daily  . folic acid  0.5 mg Oral Daily  . furosemide  20 mg Oral BID  . gabapentin  600 mg Oral TID  . hydrocortisone  10 mg Oral Q lunch  . hydrocortisone  20 mg Oral BID AC  . insulin aspart  0-5 Units Subcutaneous QHS  . insulin aspart  0-9 Units Subcutaneous TID WC  . insulin glargine  20 Units Subcutaneous Q2200  . isosorbide mononitrate  15 mg Oral Daily  . metoCLOPramide  5 mg Oral Daily  . pantoprazole  40  mg Oral Daily  . thyroid  60 mg Oral Daily  . traMADol  100 mg Oral BID  . traZODone  100 mg Oral QPM  . vitamin B-12  1,000 mcg Oral Daily  . warfarin  3 mg Oral ONCE-1800  . Warfarin - Pharmacist Dosing Inpatient   Does not apply q1800   Continuous Infusions: . sodium chloride 50 mL/hr at 07/07/17 1828  . cefTRIAXone (ROCEPHIN)  IV Stopped (07/07/17 1855)    Marzetta Board, MD, PhD [facility] Pager [phone]  If 7PM-7AM, please  contact night-coverage [email or web address] Password [email or web address] 07/08/2017, 2:50 PM

## 2017-07-08 NOTE — Evaluation (Signed)
Physical Therapy Evaluation Patient Details Name: [name] MRN: [id number] DOB: 04-03-1940 Today's Date: 07/08/2017   History of Present Illness  Pt is a 77 y/o male admitted secondary to L LE pain. [place] was negative for DVT. Pt found to have L LE cellulitis. PMH including but not limited to CAD, CHF, CKD and HTN.    Clinical Impression  Pt presented supine in bed with HOB elevated, awake and willing to participate in therapy session. Prior to admission, pt reported that he ambulated with use of SPC and was independent with ADLs. Pt lives with his spouse and has family that can provide 24/7 supervision/assistance. Pt currently able to perform bed mobility with min guard, transfers with min A and ambulated in hallway with min guard and two UE supports on IV pole. PT discussed pt using his RW upon d/c home for improved stability and support. Pt would continue to benefit from skilled physical therapy services at this time while admitted and after d/c to address the below listed limitations in order to improve overall safety and independence with functional mobility.     Follow Up Recommendations Home health PT;Supervision/Assistance - 24 hour (versus OP PT - pt undecided about preference)    Equipment Recommendations  None recommended by PT    Recommendations for Other Services       Precautions / Restrictions Precautions Precautions: Fall Restrictions Weight Bearing Restrictions: No      Mobility  Bed Mobility Overal bed mobility: Needs Assistance Bed Mobility: Supine to Sit;Sit to Supine     Supine to sit: Min guard Sit to supine: Min guard   General bed mobility comments: increased time and effort, min guard for safety  Transfers Overall transfer level: Needs assistance Equipment used: Straight cane Transfers: Sit to/from Stand Sit to Stand: Min assist         General transfer comment: increased time and effort, min A for stability and to power into standing  from EOB. pt performed x2  Ambulation/Gait Ambulation/Gait assistance: Min guard Gait Distance (Feet): 50 Feet Assistive device: IV Pole Gait Pattern/deviations: Step-through pattern;Decreased stride length Gait velocity: decreased Gait velocity interpretation: 1.31 - 2.62 ft/sec, indicative of limited community ambulator General Gait Details: pt with mild instability but no overt LOB or need for physical assistance, close min guard for safety. Pt using bilateral UEs on IV pole. Discussed [place] of his RW upon d/c home. Pt on 2L of O2 via [place] throughout with SPO2 maintaining >90%.  Stairs            Wheelchair Mobility    Modified Rankin (Stroke Patients Only)       Balance Overall balance assessment: Needs assistance Sitting-balance support: Feet supported Sitting balance-Leahy Scale: Good     Standing balance support: During functional activity;Single extremity supported Standing balance-Leahy Scale: Poor                               Pertinent Vitals/Pain Pain Assessment: No/denies pain    Home Living Family/patient expects to be discharged to:: Private residence Living Arrangements: Spouse/significant other Available Help at Discharge: Family;Available 24 hours/day Type of Home: House Home Access: Stairs to enter   [employer] of Steps: 2 Home Layout: One level Home Equipment: Walker - 2 wheels;Cane - single point      Prior Function Level of Independence: Independent with assistive device(s)         Comments: ambulates with use of  SPC     Hand Dominance        Extremity/Trunk Assessment   Upper Extremity Assessment Upper Extremity Assessment: Overall WFL for tasks assessed    Lower Extremity Assessment Lower Extremity Assessment: Generalized weakness       Communication   Communication: No difficulties  Cognition Arousal/Alertness: Awake/alert Behavior During Therapy: WFL for tasks assessed/performed Overall  Cognitive Status: Within Functional Limits for tasks assessed                                 General Comments: cognition not formally assessed but [facility] for general conversation      General Comments      Exercises     Assessment/Plan    PT Assessment Patient needs continued PT services  PT Problem List Decreased balance;Decreased mobility;Decreased coordination;Decreased knowledge of use of DME;Decreased safety awareness;Decreased knowledge of precautions       PT Treatment Interventions DME instruction;Gait training;Stair training;Functional mobility training;Therapeutic activities;Therapeutic exercise;Balance training;Neuromuscular re-education;Patient/family education    PT Goals (Current goals can be found in the Care Plan section)  Acute Rehab PT Goals Patient Stated Goal: return home PT Goal Formulation: With patient Time For Goal Achievement: 07/22/17 Potential to Achieve Goals: Good    Frequency Min 3X/week   Barriers to discharge        Co-evaluation               AM-PAC PT "6 Clicks" Daily Activity  Outcome Measure Difficulty turning over in bed (including adjusting bedclothes, sheets and blankets)?: None Difficulty moving from lying on back to sitting on the side of the bed? : None Difficulty sitting down on and standing up from a chair with arms (e.g., wheelchair, bedside commode, etc,.)?: Unable Help needed moving to and from a bed to chair (including a wheelchair)?: A Little Help needed walking in hospital room?: A Little Help needed climbing 3-5 steps with a railing? : A Little 6 Click Score: 18    End of Session Equipment Utilized During Treatment: Gait belt;Oxygen Activity Tolerance: Patient tolerated treatment well Patient left: in bed;with call bell/phone within reach Nurse Communication: Mobility status PT Visit Diagnosis: Other abnormalities of gait and mobility (R26.89)    Time: 2831-5176 PT Time Calculation (min) (ACUTE  ONLY): 25 min   Charges:   PT Evaluation $PT Eval Moderate Complexity: 1 Mod PT Treatments $Gait Training: 8-22 mins   PT G Codes:        [place], PT, DPT [place] 07/08/2017, 11:02 AM

## 2017-07-08 NOTE — Progress Notes (Signed)
ANTICOAGULATION CONSULT NOTE  Pharmacy Consult:  Coumadin Indication: History of DVT/PE  No Known Allergies  Patient Measurements: Height: 5\' 10"  (177.8 cm) Weight: 206 lb 5.6 oz (93.6 kg) IBW/kg (Calculated) : 73  Vital Signs: Temp: 98 F (36.7 C) (06/14 0400) Temp Source: Oral (06/14 0400) BP: 105/72 (06/14 0400) Pulse Rate: 84 (06/14 0400)  Labs: Recent Labs    07/07/17 0750 07/07/17 0840 07/08/17 0508  HGB 13.1  --  12.6*  HCT 42.6  --  41.7  PLT 421*  --  363  LABPROT  --  19.4* 20.8*  INR  --  1.65 1.81  CREATININE 2.10*  --  1.91*    Estimated Creatinine Clearance: 37.8 mL/min (A) (by C-G formula based on SCr of 1.91 mg/dL (H)).   Assessment: 41 YOM with history of DVT/PE presents to ED with left leg swelling and SOB.  Patient is on Coumadin PTA and INR subtherapeutic on admit.  Was subtherapeutic last week at 1.4 when seen at the Coumadin clinic. He was instructed to take 2 boosted doses over 2 days with no change to the overall weekly dose.   U/S negative for DVT, leg concerning for cellulitis.  VQ scan low probability for PE.  INR sub-therapeutic but trending up.  No bleeding reported.  Home Coumadin regimen: 3mg  daily except 1.5mg  on Thurs and Sun   Goal of Therapy:  INR 2-3 Monitor platelets by anticoagulation protocol: Yes    Plan:  Repeat Coumadin 3mg  PO today Daily PT / INR   Bobbie Valletta D. Mina Marble, PharmD, BCPS, BCCCP Pager:  [phone] 07/08/2017, 9:56 AM

## 2017-07-09 LAB — CBC
HEMATOCRIT: 36.8 % — AB (ref 39.0–52.0)
Hemoglobin: 11.3 g/dL — ABNORMAL LOW (ref 13.0–17.0)
MCH: 25.1 pg — ABNORMAL LOW (ref 26.0–34.0)
MCHC: 30.7 g/dL (ref 30.0–36.0)
MCV: 81.6 fL (ref 78.0–100.0)
Platelets: 375 10*3/uL (ref 150–400)
RBC: 4.51 MIL/uL (ref 4.22–5.81)
RDW: 17.4 % — AB (ref 11.5–15.5)
WBC: 15.4 10*3/uL — ABNORMAL HIGH (ref 4.0–10.5)

## 2017-07-09 LAB — GLUCOSE, CAPILLARY
GLUCOSE-CAPILLARY: 126 mg/dL — AB (ref 65–99)
GLUCOSE-CAPILLARY: 162 mg/dL — AB (ref 65–99)
GLUCOSE-CAPILLARY: 159 mg/dL — AB (ref 65–99)
Glucose-Capillary: 124 mg/dL — ABNORMAL HIGH (ref 65–99)
Glucose-Capillary: 157 mg/dL — ABNORMAL HIGH (ref 65–99)

## 2017-07-09 LAB — MRSA PCR SCREENING: MRSA by PCR: POSITIVE — AB

## 2017-07-09 LAB — TSH: TSH: 2.237 u[IU]/mL (ref 0.350–4.500)

## 2017-07-09 LAB — BASIC METABOLIC PANEL
Anion gap: 10 (ref 5–15)
BUN: 20 mg/dL (ref 6–20)
CALCIUM: 8.3 mg/dL — AB (ref 8.9–10.3)
CO2: 20 mmol/L — AB (ref 22–32)
CREATININE: 1.62 mg/dL — AB (ref 0.61–1.24)
Chloride: 106 mmol/L (ref 101–111)
GFR calc non Af Amer: 40 mL/min — ABNORMAL LOW (ref >60)
GFR, EST AFRICAN AMERICAN: 46 mL/min — AB (ref >60)
Glucose, Bld: 118 mg/dL — ABNORMAL HIGH (ref 65–99)
Potassium: 4.7 mmol/L (ref 3.5–5.1)
SODIUM: 136 mmol/L (ref 135–145)

## 2017-07-09 LAB — PROTIME-INR
INR: 1.86
PROTHROMBIN TIME: 21.3 s — AB (ref 11.4–15.2)

## 2017-07-09 MED ORDER — WARFARIN SODIUM 4 MG PO TABS
4.0000 mg | ORAL_TABLET | Freq: Once | ORAL | Status: AC
  Administered 2017-07-09: 4 mg via ORAL
  Filled 2017-07-09 (×2): qty 1

## 2017-07-09 MED ORDER — MUPIROCIN 2 % EX OINT
1.0000 "application " | TOPICAL_OINTMENT | Freq: Two times a day (BID) | CUTANEOUS | Status: DC
  Administered 2017-07-09 – 2017-07-11 (×4): 1 via NASAL
  Filled 2017-07-09: qty 22

## 2017-07-09 MED ORDER — SODIUM CHLORIDE 0.9 % IV BOLUS
500.0000 mL | Freq: Once | INTRAVENOUS | Status: AC
  Administered 2017-07-09: 500 mL via INTRAVENOUS

## 2017-07-09 MED ORDER — CHLORHEXIDINE GLUCONATE CLOTH 2 % EX PADS
6.0000 | MEDICATED_PAD | Freq: Every day | CUTANEOUS | Status: DC
  Administered 2017-07-11: 6 via TOPICAL

## 2017-07-09 NOTE — Care Management (Signed)
Case manager spoke with Dr. Cruzita Lederer concerning patient's need for hospital bed. Patient has diastolic heart failure and desats. Unable to lay flat, will need hospital bed to elevate upper body. Dr. Cruzita Lederer is in agreement. Case manager notified Melene Muller, [place] Liaison.

## 2017-07-09 NOTE — Progress Notes (Signed)
Occupational Therapy Evaluation Patient Details Name: [name] MRN: [id number] DOB: 1941-01-20 Today's Date: 07/09/2017    History of Present Illness Pt is a 77 y/o male admitted secondary to L LE pain. [place] was negative for DVT. Pt found to have L LE cellulitis. PMH including but not limited to CAD, CHF, CKD and HTN.   Clinical Impression   PTA, pt was living at home with his wife, and was independent with ADLs and modified independent with functional ambulation at cane level. Session limited today due to pt's medical status. Upon arrival pt was sitting in recliner on 3lnc SpO2 98, able to maintain SpO2 >94 RA.  Upon initial sit<>stand pt appeared significantly symptomatic of orthostatic hypotension, losing his balance and "seeing stars", upon return to sitting BP 140/90 and O2 92 RA. With second sit<>stand pt required use of RW and modA to stabilize with BP 107/66 standing. Physician and nursing notified of pt's status.  Due to decline in current level of function, pt would benefit from acute OT to further assess current level of function and address established goals to facilitate safe D/C home. At this time, recommend HHOT follow-up.      Follow Up Recommendations  Home health OT;Supervision/Assistance - 24 hour    Equipment Recommendations  3 in 1 bedside commode    Recommendations for Other Services PT consult     Precautions / Restrictions Precautions Precautions: Fall Restrictions Weight Bearing Restrictions: No      Mobility Bed Mobility               General bed mobility comments: pt sitting in recline upon arrival  Transfers Overall transfer level: Needs assistance Equipment used: Rolling walker (2 wheeled) Transfers: Sit to/from Stand Sit to Stand: Mod assist         General transfer comment: pt significantly symptomatic upon initial sit<>stand,     Balance Overall balance assessment: Needs assistance Sitting-balance support: Feet  supported Sitting balance-Leahy Scale: Good     Standing balance support: Bilateral upper extremity supported Standing balance-Leahy Scale: Poor Standing balance comment: required immediate return to sitting; pt able to stand for 5min on second trial with modA for stability with static standing                           ADL either performed or assessed with clinical judgement   ADL Overall ADL's : Needs assistance/impaired     Grooming: Set up;Sitting   Upper Body Bathing: Set up;Sitting             Lower Body Dressing Details (indicate cue type and reason): able to reach down and adjust socks               General ADL Comments: unable to adequately assess due to pt's orthostatic hypotension upon sit<>stand, will further assess.     Vision   Additional Comments: will further assess     Perception     Praxis      Pertinent Vitals/Pain Pain Assessment: Faces Faces Pain Scale: Hurts even more Pain Location: Ribs on Rside Pain Descriptors / Indicators: Grimacing;Constant;Sore Pain Intervention(s): Limited activity within patient's tolerance;Monitored during session     Hand Dominance Right   Extremity/Trunk Assessment Upper Extremity Assessment Upper Extremity Assessment: Overall WFL for tasks assessed   Lower Extremity Assessment Lower Extremity Assessment: Defer to PT evaluation   Cervical / Trunk Assessment Cervical / Trunk Assessment: Normal   Communication Communication Communication:  No difficulties   Cognition Arousal/Alertness: Awake/alert Behavior During Therapy: Agitated Overall Cognitive Status: No family/caregiver present to determine baseline cognitive functioning                                 General Comments: cognition not fomally assessed, WNL for conversation;pt appeared agitated and stated "im frustrated with my health"   General Comments  upon arrival pt SpO2 98 on 3lnc sitting; pt able to maintain SpO2  96 RA sitting in recliner;upon initial sit<>stand pt reported dizziness/seeing stars and feeling like "he was spinning", pt immediately returned to sitting due to LOB; Upon return to sitting SpO2 92 RA, BP 140/90; second sit<>stand pt required modA for stability reported feeling dizzy and unstable, BP 107/66 standing; pt returned to recliner and educated about BP drop;pt reported similar experiences occurred at home, anticipate this may have occurred with pt's previous fall as pt reported he "felt unsteady and next thing he knew he was on the ground";educated pt to get up with assistance and to use RW;notified physician/RN/RT of pt's status.;pt SpO2 <94 RA educated to monitor level and put on [place] if SpO2 92 or below;    Exercises     Shoulder Instructions      Home Living Family/patient expects to be discharged to:: Private residence Living Arrangements: Spouse/significant other Available Help at Discharge: Family;Available 24 hours/day Type of Home: House Home Access: Stairs to enter [employer] of Steps: 2   Home Layout: One level     Bathroom Shower/Tub: Tub/shower unit;Curtain   Biochemist, clinical: Standard     Home Equipment: Environmental consultant - 2 wheels;Cane - single point;Grab bars - tub/shower          Prior Functioning/Environment Level of Independence: Independent with assistive device(s)        Comments: ambulates with use of SPC        OT Problem List: Decreased strength;Decreased range of motion;Decreased activity tolerance;Impaired balance (sitting and/or standing);Cardiopulmonary status limiting activity;Pain      OT Treatment/Interventions: Therapeutic exercise;Self-care/ADL training;Energy conservation;DME and/or AE instruction;Therapeutic activities;Patient/family education;Balance training    OT Goals(Current goals can be found in the care plan section) Acute Rehab OT Goals Patient Stated Goal: return home OT Goal Formulation: With patient Time For Goal  Achievement: 07/23/17 Potential to Achieve Goals: Good  OT Frequency: Min 2X/week   Barriers to D/C:            Co-evaluation              AM-PAC PT "6 Clicks" Daily Activity     Outcome Measure Help from another person eating meals?: None Help from another person taking care of personal grooming?: A Little Help from another person toileting, which includes using toliet, bedpan, or urinal?: A Lot Help from another person bathing (including washing, rinsing, drying)?: A Lot Help from another person to put on and taking off regular upper body clothing?: A Little Help from another person to put on and taking off regular lower body clothing?: A Little 6 Click Score: 17   End of Session Equipment Utilized During Treatment: Gait belt;Rolling walker Nurse Communication: Mobility status  Activity Tolerance: Treatment limited secondary to medical complications (Comment) Patient left: in chair;with chair alarm set;with call bell/phone within reach  OT Visit Diagnosis: Unsteadiness on feet (R26.81);Other abnormalities of gait and mobility (R26.89);Repeated falls (R29.6);History of falling (Z91.81);Muscle weakness (generalized) (M62.81);Dizziness and giddiness (R42)  Time: 5953-9672 OT Time Calculation (min): 32 min Charges:    G-Codes:     Dorinda Hill OTS     07/09/2017, 1:38 PM

## 2017-07-09 NOTE — Progress Notes (Signed)
ANTICOAGULATION CONSULT NOTE  Pharmacy Consult:  Coumadin Indication: History of DVT/PE  No Known Allergies  Patient Measurements: Height: 5\' 10"  (177.8 cm) Weight: 206 lb 5.6 oz (93.6 kg) IBW/kg (Calculated) : 73  Vital Signs: Temp: 98.7 F (37.1 C) (06/15 0503) Temp Source: Oral (06/15 0503) BP: 138/74 (06/15 0503) Pulse Rate: 84 (06/15 0941)  Labs: Recent Labs    07/07/17 0750 07/07/17 0840 07/08/17 0508 07/09/17 0542  HGB 13.1  --  12.6* 11.3*  HCT 42.6  --  41.7 36.8*  PLT 421*  --  363 375  LABPROT  --  19.4* 20.8* 21.3*  INR  --  1.65 1.81 1.86  CREATININE 2.10*  --  1.91* 1.62*    Estimated Creatinine Clearance: 44.6 mL/min (A) (by C-G formula based on SCr of 1.62 mg/dL (H)).   Assessment: 69 YOM with history of DVT/PE presents to ED with left leg swelling and SOB.  Patient is on Coumadin PTA and INR subtherapeutic on admit.  Was subtherapeutic last week at 1.4 when seen at the Coumadin clinic. He was instructed to take 2 boosted doses over 2 days with no change to the overall weekly dose.   U/S negative for DVT, leg concerning for cellulitis.  VQ scan low probability for PE.  INR today remains SUBtherapeutic (INR 1.86 << 1.81, goal of 2-3). Hgb/Hct slight drop, plts wnl.   Home Coumadin regimen: 3mg  daily except 1.5mg  on Thurs and Sun  Goal of Therapy:  INR 2-3 Monitor platelets by anticoagulation protocol: Yes   Plan:  - Warfarin 4 mg x 1 dose at 1800 today - Will continue to monitor for any signs/symptoms of bleeding and will follow up with PT/INR in the a.m.   Thank you for allowing pharmacy to be a part of this patient's care.  Alycia Rossetti, PharmD, BCPS Clinical Pharmacist Pager: [phone] Clinical phone for 07/09/2017 from 7a-3:30p: [phone] If after 3:30p, please call main pharmacy at: [phone] 07/09/2017 10:42 AM

## 2017-07-09 NOTE — Progress Notes (Signed)
OT Note - Addendum    07/09/17 1500  OT Visit Information  Last OT Received On 07/09/17  OT Time Calculation  OT Start Time (ACUTE ONLY) 1203  OT Stop Time (ACUTE ONLY) 1235  OT Time Calculation (min) 32 min  OT General Charges  $OT Visit 1 Visit  OT Evaluation  $OT Eval Moderate Complexity 1 Mod  OT Treatments  $Self Care/Home Management  8-22 mins  Maurie Boettcher, OT/L  OT Clinical Specialist [phone]

## 2017-07-09 NOTE — Progress Notes (Signed)
PROGRESS NOTE  [name] YTK:[id number] DOB: 1940/02/05 DOA: 07/07/2017 PCP: Lawerance Cruel, MD   LOS: 2 days   Brief Narrative / Interim history: 77 year old male with history of Addison's disease, chronic kidney disease stage III, coronary artery disease post PCI in 2017, prior DVT and PE, chronic diastolic CHF, chronic respiratory failure on home oxygen, presents with chief complaint of swelling and redness in his left leg as well as shortness of breath.  Patient is noncompliant with his oxygen, initial oxygen saturation on room air was 70%.  Assessment & Plan: Principal Problem:   Cellulitis Active Problems:   Addison disease ([place])   Benign essential HTN   Chronic respiratory failure (HCC)   Chronic diastolic CHF (congestive heart failure) (HCC)   Chronic anticoagulation   Coronary artery disease involving native coronary artery of native heart without angina pectoris   Diabetes mellitus without complication (HCC)   Respiratory distress   Acute kidney injury superimposed on chronic kidney disease (HCC)   Dementia   Left lower extremity cellulitis/swelling -DVT ultrasound in the emergency room did not show any DVT.  He is on Coumadin for recurrent DVTs and PE, continue. -He was placed on ceftriaxone, his cellulitis has significantly improved today, continue as he is responding -Blood cultures remain negative  Orthostasis / weakness -patient dehydrated on admission, still orthostatic today while working with PT, will give IVF and reassess in am  -continue to hold Lasix  Chronic hypoxic respiratory failure -Supposed to be on 3 L nasal cannula at home, however he has been noncompliant with his home oxygen since he does not have small portable unit -Chest x-ray with minimal bibasilar atelectasis without any acute abnormalities. -His oxygen saturation is above 90% on 3 L  Chronic diastolic heart failure -He underwent a 2D echo in March 2019 which showed an EF of  50% with grade 1 diastolic dysfunction -He was quite dehydrated when he came in, still orthostatic today  -Continue Coreg -Hold losartan, will discontinue on discharge  Acute kidney injury superimposed on chronic kidney disease stage III -Baseline creatinine varies between 1.3-1.8, currently at 1.9 close to baseline.  Repeat in the morning.  Hold Lasix and ARB  HTN -Continue Coreg, monitor  DM -Need to hold metformin on discharge, continue Lantus and sliding scale  CAD status post PCI 2016  -No chest pain.  Addison's disease  -Stable at baseline, continue steroids  Mild dementia  -on admission patient unable to state month. Wife states he has "cognitive issues" that have been worsening over the last year. Query chronic hypoxemia. 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   History of DVT/PE -Continue Coumadin, ultrasound without DVT, VQ scan without ventilation or perfusion defects    DVT prophylaxis: Coumadin Code Status: Full code Family Communication: wife at bedside Disposition Plan: home in 1 day  Consultants:   None   Procedures:   None   Antimicrobials:  Ceftriaxone 6/13 >>   Subjective: - no chest pain, shortness of breath, no abdominal pain, nausea or vomiting.   Objective: Vitals:   07/09/17 0941 07/09/17 1316 07/09/17 1344 07/09/17 1430  BP:  122/90    Pulse: 84 95    Resp: 18 18    Temp:  97.8 F (36.6 C)    TempSrc:  Oral    SpO2: 91% 93% 96% 98%  Weight:      Height:        Intake/Output Summary (Last 24 hours) at 07/09/2017 1445  Last data filed at 07/09/2017 1316 Gross per 24 hour  Intake 240 ml  Output 1625 ml  Net -1385 ml   Filed Weights   07/08/17 0500  Weight: 93.6 kg (206 lb 5.6 oz)    Examination:  Constitutional: NAD Respiratory: CTA Cardiovascular: RRR  Data Reviewed: I have independently reviewed following labs and imaging studies   CBC: Recent Labs  Lab  07-23-17 0750 07/08/17 0508 07/09/17 0542  WBC 16.7* 14.1* 15.4*  HGB 13.1 12.6* 11.3*  HCT 42.6 41.7 36.8*  MCV 81.8 82.4 81.6  PLT 421* 363 093   Basic Metabolic Panel: Recent Labs  Lab Jul 23, 2017 0750 Jul 23, 2017 1942 07/08/17 0508 07/09/17 0542  NA 137  --  136 136  K 3.1*  --  3.5 4.7  CL 104  --  105 106  CO2 18*  --  21* 20*  GLUCOSE 153*  --  98 118*  BUN 30*  --  25* 20  CREATININE 2.10*  --  1.91* 1.62*  CALCIUM 8.4*  --  8.2* 8.3*  MG  --  2.2  --   --    GFR: Estimated Creatinine Clearance: 44.6 mL/min (A) (by C-G formula based on SCr of 1.62 mg/dL (H)). Liver Function Tests: Recent Labs  Lab 2017-07-23 0853  AST 36  ALT 50  ALKPHOS 76  BILITOT 0.5  PROT 6.6  ALBUMIN 3.1*   No results for input(s): LIPASE, AMYLASE in the last 168 hours. No results for input(s): AMMONIA in the last 168 hours. Coagulation Profile: Recent Labs  Lab 2017-07-23 0840 07/08/17 0508 07/09/17 0542  INR 1.65 1.81 1.86   Cardiac Enzymes: No results for input(s): CKTOTAL, CKMB, CKMBINDEX, TROPONINI in the last 168 hours. BNP (last 3 results) No results for input(s): PROBNP in the last 8760 hours. HbA1C: No results for input(s): HGBA1C in the last 72 hours. CBG: Recent Labs  Lab 07/08/17 1644 07/08/17 2054 07/09/17 0638 07/09/17 0743 07/09/17 1134  GLUCAP 197* 142* 126* 124* 157*   Lipid Profile: No results for input(s): CHOL, HDL, LDLCALC, TRIG, CHOLHDL, LDLDIRECT in the last 72 hours. Thyroid Function Tests: Recent Labs    07/09/17 0542  TSH 2.237   Anemia Panel: No results for input(s): VITAMINB12, FOLATE, FERRITIN, TIBC, IRON, RETICCTPCT in the last 72 hours. Urine analysis:    Component Value Date/Time   COLORURINE YELLOW 04/22/2017 1649   APPEARANCEUR HAZY (A) 04/22/2017 1649   LABSPEC 1.014 04/22/2017 1649   PHURINE 6.0 04/22/2017 1649   GLUCOSEU >=500 (A) 04/22/2017 1649   HGBUR NEGATIVE 04/22/2017 1649   BILIRUBINUR NEGATIVE 04/22/2017 1649    KETONESUR NEGATIVE 04/22/2017 1649   PROTEINUR NEGATIVE 04/22/2017 1649   UROBILINOGEN 0.2 03/21/2007 1015   NITRITE NEGATIVE 04/22/2017 1649   LEUKOCYTESUR NEGATIVE 04/22/2017 1649   Sepsis Labs: Invalid input(s): PROCALCITONIN, LACTICIDVEN  Recent Results (from the past 240 hour(s))  Culture, blood (routine x 2)     Status: None (Preliminary result)   Collection Time: 2017/07/23  7:40 PM  Result Value Ref Range Status   Specimen Description BLOOD RIGHT HAND  Final   Special Requests   Final    BOTTLES DRAWN AEROBIC AND ANAEROBIC Blood Culture adequate volume   Culture   Final    NO GROWTH 2 DAYS Performed at [facility], [address] [place], [place] [address]    Report Status PENDING  Incomplete  Culture, blood (routine x 2)     Status: None (Preliminary result)   Collection  Time: 07/07/17  8:01 PM  Result Value Ref Range Status   Specimen Description BLOOD LEFT HAND  Final   Special Requests   Final    BOTTLES DRAWN AEROBIC AND ANAEROBIC Blood Culture results may not be optimal due to an inadequate volume of blood received in culture bottles   Culture   Final    NO GROWTH 2 DAYS Performed at [facility], [place] [address], [place], [place] [address]    Report Status PENDING  Incomplete      Radiology Studies: No results found.   Scheduled Meds: . albuterol  3 mL Inhalation TID  . carvedilol  6.25 mg Oral BID WC  . cholecalciferol  1,000 Units Oral Daily  . feeding supplement (ENSURE ENLIVE)  237 mL Oral BID BM  . folic acid  0.5 mg Oral Daily  . gabapentin  600 mg Oral TID  . hydrocortisone  10 mg Oral Q lunch  . hydrocortisone  20 mg Oral BID AC  . insulin aspart  0-5 Units Subcutaneous QHS  . insulin aspart  0-9 Units Subcutaneous TID WC  . insulin glargine  20 Units Subcutaneous Q2200  . isosorbide mononitrate  15 mg Oral Daily  . metoCLOPramide  5 mg Oral Daily  . pantoprazole  40 mg Oral Daily  . thyroid  60 mg Oral Daily  . traMADol  100 mg  Oral BID  . traZODone  100 mg Oral QPM  . vitamin B-12  1,000 mcg Oral Daily  . warfarin  4 mg Oral ONCE-1800  . Warfarin - Pharmacist Dosing Inpatient   Does not apply q1800   Continuous Infusions: . cefTRIAXone (ROCEPHIN)  IV Stopped (07/08/17 1707)  . sodium chloride      Marzetta Board, MD, PhD [facility] Pager [phone]  If 7PM-7AM, please contact night-coverage [email or web address] Password [email or web address] 07/09/2017, 2:45 PM

## 2017-07-10 LAB — GLUCOSE, CAPILLARY
GLUCOSE-CAPILLARY: 128 mg/dL — AB (ref 65–99)
Glucose-Capillary: 173 mg/dL — ABNORMAL HIGH (ref 65–99)
Glucose-Capillary: 213 mg/dL — ABNORMAL HIGH (ref 65–99)
Glucose-Capillary: 171 mg/dL — ABNORMAL HIGH (ref 65–99)

## 2017-07-10 LAB — BASIC METABOLIC PANEL
Anion gap: 7 (ref 5–15)
BUN: 16 mg/dL (ref 6–20)
CO2: 21 mmol/L — ABNORMAL LOW (ref 22–32)
CREATININE: 1.5 mg/dL — AB (ref 0.61–1.24)
Calcium: 8.4 mg/dL — ABNORMAL LOW (ref 8.9–10.3)
Chloride: 108 mmol/L (ref 101–111)
GFR calc Af Amer: 50 mL/min — ABNORMAL LOW (ref >60)
GFR, EST NON AFRICAN AMERICAN: 43 mL/min — AB (ref >60)
GLUCOSE: 205 mg/dL — AB (ref 65–99)
POTASSIUM: 4.3 mmol/L (ref 3.5–5.1)
Sodium: 136 mmol/L (ref 135–145)

## 2017-07-10 LAB — CBC
HCT: 37.5 % — ABNORMAL LOW (ref 39.0–52.0)
Hemoglobin: 11.5 g/dL — ABNORMAL LOW (ref 13.0–17.0)
MCH: 25.3 pg — AB (ref 26.0–34.0)
MCHC: 30.7 g/dL (ref 30.0–36.0)
MCV: 82.6 fL (ref 78.0–100.0)
PLATELETS: 361 10*3/uL (ref 150–400)
RBC: 4.54 MIL/uL (ref 4.22–5.81)
RDW: 17.8 % — AB (ref 11.5–15.5)
WBC: 14.9 10*3/uL — ABNORMAL HIGH (ref 4.0–10.5)

## 2017-07-10 LAB — PROTIME-INR
INR: 1.88
Prothrombin Time: 21.4 seconds — ABNORMAL HIGH (ref 11.4–15.2)

## 2017-07-10 MED ORDER — CARVEDILOL 3.125 MG PO TABS
3.1250 mg | ORAL_TABLET | Freq: Two times a day (BID) | ORAL | Status: DC
  Administered 2017-07-10 – 2017-07-11 (×2): 3.125 mg via ORAL
  Filled 2017-07-10 (×2): qty 1

## 2017-07-10 MED ORDER — ALBUTEROL SULFATE (2.5 MG/3ML) 0.083% IN NEBU
3.0000 mL | INHALATION_SOLUTION | Freq: Two times a day (BID) | RESPIRATORY_TRACT | Status: DC
  Administered 2017-07-10: 3 mL via RESPIRATORY_TRACT
  Filled 2017-07-10 (×2): qty 3

## 2017-07-10 MED ORDER — FLUDROCORTISONE ACETATE 0.1 MG PO TABS
0.1000 mg | ORAL_TABLET | Freq: Every day | ORAL | Status: DC
Start: 2017-07-10 — End: 2017-07-11
  Administered 2017-07-10 – 2017-07-11 (×2): 0.1 mg via ORAL
  Filled 2017-07-10 (×2): qty 1

## 2017-07-10 MED ORDER — WARFARIN SODIUM 5 MG PO TABS
5.0000 mg | ORAL_TABLET | Freq: Once | ORAL | Status: AC
  Administered 2017-07-10: 5 mg via ORAL
  Filled 2017-07-10: qty 1

## 2017-07-10 NOTE — Progress Notes (Signed)
Occupational Therapy Treatment Patient Details Name: [name] MRN: [id number] DOB: Jul 09, 1940 Today's Date: 07/10/2017    History of present illness Pt is a 77 y/o male admitted secondary to L LE pain. [place] was negative for DVT. Pt found to have L LE cellulitis. PMH including but not limited to CAD, CHF, CKD and HTN.   OT comments  Session limited due to pt's medical status, pt still significantly symptomatic of orthostatic hypotension reporting dizziness with initial progression to EOB with min guard. Pt on 2.5lnc and maintained SpO2 >93. Pt returned to supine to take orthostatic BP (listed below). Pt required minA for stand-pivot to recliner. Due to deficits listed below (see OT problem list) pt would benefit from continued skilled acute OT to continue to monitor pt and progress as tolerated to maximize pt's independence with ADLs and factilitate safe d/c home. Continue to recommend HHOT follow-up pending pt's progress.  Nursing notified of pt's status. Orthostatic BPs  Supine 150/85  Sitting 144/114     Standing 89/78   After light activity sitting 147/85      Follow Up Recommendations  Home health OT;Supervision/Assistance - 24 hour    Equipment Recommendations  3 in 1 bedside commode    Recommendations for Other Services PT consult    Precautions / Restrictions Precautions Precautions: Fall Precaution Comments: orthostatic Restrictions Weight Bearing Restrictions: No       Mobility Bed Mobility Overal bed mobility: Modified Independent Bed Mobility: Supine to Sit     Supine to sit: Min guard;HOB elevated Sit to supine: Min guard;HOB elevated   General bed mobility comments: pt reported feeling light headed/dizzy sitting EOB;pt returned to supine for vitals   Transfers Overall transfer level: Needs assistance Equipment used: Rolling walker (2 wheeled) Transfers: Sit to/from Stand Sit to Stand: Min assist         General transfer comment: pt  symptomatic of orthostatic hypotension     Balance Overall balance assessment: Needs assistance Sitting-balance support: No upper extremity supported;Feet supported Sitting balance-Leahy Scale: Poor     Standing balance support: Bilateral upper extremity supported Standing balance-Leahy Scale: Poor Standing balance comment: reported feeling light headed with static and dynamic standing;dizziness subsided after standing for 1 min.                            ADL either performed or assessed with clinical judgement   ADL Overall ADL's : Needs assistance/impaired                     Lower Body Dressing: Set up Lower Body Dressing Details (indicate cue type and reason): after VC, pt able to don socks reclined in bed Toilet Transfer: Minimal assistance;Stand-pivot Toilet Transfer Details (indicate cue type and reason): sat EOB to use urinal with set up assist;simulated transfer stand-pivot from EOB to recliner         Functional mobility during ADLs: Minimal assistance;Rolling walker General ADL Comments: limited session due to pt's orthostatic hypotension     Vision       Perception     Praxis      Cognition Arousal/Alertness: Awake/alert Behavior During Therapy: WFL for tasks assessed/performed Overall Cognitive Status: No family/caregiver present to determine baseline cognitive functioning                                 General Comments: per H&P  Dementia         Exercises     Shoulder Instructions       General Comments educated pt on orthostatic hypotension and associated symptoms;educated pt on importance of sitting up during the day;notified RN/NT about pt's orthostatics;recommeded if RN/NT walk with him to use RW,gait belt with close minA,and follow with recliner to see how pt responds to mobility    Pertinent Vitals/ Pain       Pain Assessment: Faces Faces Pain Scale: Hurts a little bit Pain Location: ribs on Rside Pain  Descriptors / Indicators: Sore Pain Intervention(s): Limited activity within patient's tolerance  Home Living                                          Prior Functioning/Environment              Frequency  Min 2X/week        Progress Toward Goals  OT Goals(current goals can now be found in the care plan section)  Progress towards OT goals: Not progressing toward goals - comment(mobility limited by medical complications)  Acute Rehab OT Goals Patient Stated Goal: return home OT Goal Formulation: With patient Time For Goal Achievement: 07/24/17 Potential to Achieve Goals: Good ADL Goals Pt Will Perform Lower Body Dressing: with modified independence;sit to/from stand Pt Will Transfer to Toilet: with modified independence;ambulating Pt Will Perform Tub/Shower Transfer: Tub transfer;with modified independence;grab bars Additional ADL Goal #1: Pt will verbalize understanding of 3 fall prevention strategies.  Plan Discharge plan remains appropriate    Co-evaluation                 AM-PAC PT "6 Clicks" Daily Activity     Outcome Measure   Help from another person eating meals?: None Help from another person taking care of personal grooming?: A Little Help from another person toileting, which includes using toliet, bedpan, or urinal?: A Little Help from another person bathing (including washing, rinsing, drying)?: A Lot Help from another person to put on and taking off regular upper body clothing?: A Little Help from another person to put on and taking off regular lower body clothing?: A Little 6 Click Score: 18    End of Session Equipment Utilized During Treatment: Gait belt;Rolling walker  OT Visit Diagnosis: Unsteadiness on feet (R26.81);Other abnormalities of gait and mobility (R26.89);Repeated falls (R29.6);History of falling (Z91.81);Muscle weakness (generalized) (M62.81);Dizziness and giddiness (R42)   Activity Tolerance Treatment limited  secondary to medical complications (Comment)(orthostatic hypotension)   Patient Left in chair;with call bell/phone within reach;with chair alarm set   Nurse Communication Mobility status(orthostatics)        Time: 5465-6812 OT Time Calculation (min): 32 min  Charges:    Dorinda Hill OTS      07/10/2017, 4:29 PM

## 2017-07-10 NOTE — Progress Notes (Signed)
PROGRESS NOTE  [name] IRJ:[id number] DOB: 1940-12-10 DOA: 07/07/2017 PCP: Lawerance Cruel, MD   LOS: 3 days   Brief Narrative / Interim history: 77 year old male with history of Addison's disease, chronic kidney disease stage III, coronary artery disease post PCI in 2017, prior DVT and PE, chronic diastolic CHF, chronic respiratory failure on home oxygen, presents with chief complaint of swelling and redness in his left leg as well as shortness of breath.  Patient is noncompliant with his oxygen, initial oxygen saturation on room air was 70%.  Assessment & Plan: Principal Problem:   Cellulitis Active Problems:   Addison disease ([place])   Benign essential HTN   Chronic respiratory failure (HCC)   Chronic diastolic CHF (congestive heart failure) (HCC)   Chronic anticoagulation   Coronary artery disease involving native coronary artery of native heart without angina pectoris   Diabetes mellitus without complication (HCC)   Respiratory distress   Acute kidney injury superimposed on chronic kidney disease (HCC)   Dementia   Left lower extremity cellulitis/swelling -DVT ultrasound in the emergency room did not show any DVT.  He is on Coumadin for recurrent DVTs and PE, continue. -He was placed on ceftriaxone, his cellulitis is almost resolved, transition to doxycycline on 6/17 after today's dose  Orthostasis / weakness -Patient was adequately hydrated, his Lasix and losartan were held, however he is persistently orthostatic.  Systolic blood pressure went down to 87 while standing from 140 while laying down.  In the light of Addison disease, suspect primary but I am not sure, nevertheless he may benefit from [place].  We will initiate today.  Chronic hypoxic respiratory failure -Supposed to be on 3 L nasal cannula at home, however he has been noncompliant with his home oxygen since he does not have small portable unit -Chest x-ray with minimal bibasilar atelectasis without  any acute abnormalities. -His oxygen saturation is above 90% on 3 L continue nasal cannula.  His respiratory status is stable.  Chronic diastolic heart failure -He underwent a 2D echo in March 2019 which showed an EF of 50% with grade 1 diastolic dysfunction -He was quite dehydrated when he came in, still orthostatic today  -Continue Coreg -Hold losartan, will discontinue on discharge  Acute kidney injury superimposed on chronic kidney disease stage III -Baseline creatinine varies between 1.3-1.8, currently at 1.9 close to baseline.  Repeat in the morning.  Hold Lasix and ARB  HTN -Continue Coreg, monitor.  Decrease the dose of Coreg today due to ongoing orthostatic hypotension  DM -Need to hold metformin on discharge due to renal failure, continue Lantus and sliding scale -Fasting CBG 128, continue current regimen  CAD status post PCI 2016  -No chest pain.  Addison's disease  -Stable at baseline, continue steroids -added Florinef as outlined above  Mild dementia  -on admission patient unable to state month. Wife states he has "cognitive issues" that have been worsening over the last year. Query chronic hypoxemia. 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   History of DVT/PE -Continue Coumadin, ultrasound without DVT, VQ scan without ventilation or perfusion defects    DVT prophylaxis: Coumadin Code Status: Full code Family Communication: Son at bedside today Disposition Plan: home in 1 day  Consultants:   None   Procedures:   None   Antimicrobials:  Ceftriaxone 6/13 >>   Subjective: -He feels well when laying down, describes feeling quite lightheaded and dizzy and about to pass out when he stood up for  orthostatics  Objective: Vitals:   07/10/17 0847 07/10/17 0918 07/10/17 0921 07/10/17 0924  BP:  (!) 87/72 118/77 (!) 145/100  Pulse: 78 93 87 79  Resp: 18     Temp:  97.7 F (36.5 C)    TempSrc:  Oral    SpO2:  97% 100% 100% 96%  Weight:      Height:        Intake/Output Summary (Last 24 hours) at 07/10/2017 1202 Last data filed at 07/10/2017 0536 Gross per 24 hour  Intake 480 ml  Output 1775 ml  Net -1295 ml   Filed Weights   07/08/17 0500 07/10/17 0532  Weight: 93.6 kg (206 lb 5.6 oz) 100.4 kg (221 lb 5.5 oz)    Examination:  Constitutional: NAD, calm, comfortable Eyes: PERRL, lids and conjunctivae normal ENMT: Mucous membranes are moist.  Neck: normal, supple Respiratory: clear to auscultation bilaterally, no wheezing, no crackles. Normal respiratory effort.  Cardiovascular: Regular rate and rhythm, no murmurs / rubs / gallops. Trace LE edema. 2+ pedal pulses.  Abdomen: no tenderness. Bowel sounds positive.  Skin: no rashes, lesions, ulcers. Callus left plantar aspect Neurologic: non focal   Data Reviewed: I have independently reviewed following labs and imaging studies   CBC: Recent Labs  Lab 07/07/17 0750 07/08/17 0508 07/09/17 0542 07/10/17 0934  WBC 16.7* 14.1* 15.4* 14.9*  HGB 13.1 12.6* 11.3* 11.5*  HCT 42.6 41.7 36.8* 37.5*  MCV 81.8 82.4 81.6 82.6  PLT 421* 363 375 063   Basic Metabolic Panel: Recent Labs  Lab 07/07/17 0750 07/07/17 1942 07/08/17 0508 07/09/17 0542 07/10/17 0934  NA 137  --  136 136 136  K 3.1*  --  3.5 4.7 4.3  CL 104  --  105 106 108  CO2 18*  --  21* 20* 21*  GLUCOSE 153*  --  98 118* 205*  BUN 30*  --  25* 20 16  CREATININE 2.10*  --  1.91* 1.62* 1.50*  CALCIUM 8.4*  --  8.2* 8.3* 8.4*  MG  --  2.2  --   --   --    GFR: Estimated Creatinine Clearance: 49.8 mL/min (A) (by C-G formula based on SCr of 1.5 mg/dL (H)). Liver Function Tests: Recent Labs  Lab 07/07/17 0853  AST 36  ALT 50  ALKPHOS 76  BILITOT 0.5  PROT 6.6  ALBUMIN 3.1*   No results for input(s): LIPASE, AMYLASE in the last 168 hours. No results for input(s): AMMONIA in the last 168 hours. Coagulation Profile: Recent Labs  Lab 07/07/17 0840  07/08/17 0508 07/09/17 0542 07/10/17 0934  INR 1.65 1.81 1.86 1.88   Cardiac Enzymes: No results for input(s): CKTOTAL, CKMB, CKMBINDEX, TROPONINI in the last 168 hours. BNP (last 3 results) No results for input(s): PROBNP in the last 8760 hours. HbA1C: No results for input(s): HGBA1C in the last 72 hours. CBG: Recent Labs  Lab 07/09/17 0743 07/09/17 1134 07/09/17 1624 07/09/17 2019 07/10/17 0630  GLUCAP 124* 157* 162* 159* 128*   Lipid Profile: No results for input(s): CHOL, HDL, LDLCALC, TRIG, CHOLHDL, LDLDIRECT in the last 72 hours. Thyroid Function Tests: Recent Labs    07/09/17 0542  TSH 2.237   Anemia Panel: No results for input(s): VITAMINB12, FOLATE, FERRITIN, TIBC, IRON, RETICCTPCT in the last 72 hours. Urine analysis:    Component Value Date/Time   COLORURINE YELLOW 04/22/2017 1649   APPEARANCEUR HAZY (A) 04/22/2017 1649   LABSPEC 1.014 04/22/2017 1649   PHURINE 6.0 04/22/2017  [place] >=500 (A) 04/22/2017 1649   HGBUR NEGATIVE 04/22/2017 1649   BILIRUBINUR NEGATIVE 04/22/2017 1649   KETONESUR NEGATIVE 04/22/2017 1649   PROTEINUR NEGATIVE 04/22/2017 1649   UROBILINOGEN 0.2 03/21/2007 1015   NITRITE NEGATIVE 04/22/2017 1649   LEUKOCYTESUR NEGATIVE 04/22/2017 1649   Sepsis Labs: Invalid input(s): PROCALCITONIN, LACTICIDVEN  Recent Results (from the past 240 hour(s))  Culture, blood (routine x 2)     Status: None (Preliminary result)   Collection Time: 07/07/17  7:40 PM  Result Value Ref Range Status   Specimen Description BLOOD RIGHT HAND  Final   Special Requests   Final    BOTTLES DRAWN AEROBIC AND ANAEROBIC Blood Culture adequate volume   Culture   Final    NO GROWTH 3 DAYS Performed at [facility], [address] [place], [place] [address]    Report Status PENDING  Incomplete  Culture, blood (routine x 2)     Status: None (Preliminary result)   Collection Time: 07/07/17  8:01 PM  Result Value Ref Range Status   Specimen  Description BLOOD LEFT HAND  Final   Special Requests   Final    BOTTLES DRAWN AEROBIC AND ANAEROBIC Blood Culture results may not be optimal due to an inadequate volume of blood received in culture bottles   Culture   Final    NO GROWTH 3 DAYS Performed at [facility], [place] [address] [place], [place] [address]    Report Status PENDING  Incomplete  MRSA PCR Screening     Status: Abnormal   Collection Time: 07/09/17  2:17 PM  Result Value Ref Range Status   MRSA by PCR POSITIVE (A) NEGATIVE Final    Comment:        The GeneXpert MRSA Assay (FDA approved for NASAL specimens only), is one component of a comprehensive MRSA colonization surveillance program. It is not intended to diagnose MRSA infection nor to guide or monitor treatment for MRSA infections. RESULT CALLED TO, READ BACK BY AND VERIFIED WITH: RN Renato Battles [id number] MLM Performed at [facility], [address] [place], [place] [address]       Radiology Studies: No results found.   Scheduled Meds: . albuterol  3 mL Inhalation BID  . carvedilol  3.125 mg Oral BID WC  . Chlorhexidine Gluconate Cloth  6 each Topical Q0600  . cholecalciferol  1,000 Units Oral Daily  . feeding supplement (ENSURE ENLIVE)  237 mL Oral BID BM  . fludrocortisone  0.1 mg Oral Daily  . folic acid  0.5 mg Oral Daily  . gabapentin  600 mg Oral TID  . hydrocortisone  10 mg Oral Q lunch  . hydrocortisone  20 mg Oral BID AC  . insulin aspart  0-5 Units Subcutaneous QHS  . insulin aspart  0-9 Units Subcutaneous TID WC  . insulin glargine  20 Units Subcutaneous Q2200  . isosorbide mononitrate  15 mg Oral Daily  . metoCLOPramide  5 mg Oral Daily  . mupirocin ointment  1 application Nasal BID  . pantoprazole  40 mg Oral Daily  . thyroid  60 mg Oral Daily  . traMADol  100 mg Oral BID  . traZODone  100 mg Oral QPM  . vitamin B-12  1,000 mcg Oral Daily  . warfarin  5 mg Oral ONCE-1800  . Warfarin - Pharmacist Dosing Inpatient    Does not apply q1800   Continuous Infusions: . cefTRIAXone (ROCEPHIN)  IV  Stopped (07/09/17 1646)    Dennis Board, MD, PhD [facility] Pager [phone]  If 7PM-7AM, please contact night-coverage [email or web address] Password [facility] 07/10/2017, 12:02 PM

## 2017-07-10 NOTE — Progress Notes (Signed)
OT Note - Addendum    07/10/17 1600  OT Visit Information  Last OT Received On 07/10/17  OT Time Calculation  OT Start Time (ACUTE ONLY) 1203  OT Stop Time (ACUTE ONLY) 1235  OT Time Calculation (min) 32 min  OT General Charges  $OT Visit 1 Visit  OT Treatments  $Self Care/Home Management  23-37 mins  Maurie Boettcher, OT/L  OT Clinical Specialist [phone]

## 2017-07-10 NOTE — Progress Notes (Signed)
Pt has a hard callus with a small open area on plantar surface of left foot. No drainage or redness noted. Asked pt to show it to MD for evaluation.

## 2017-07-10 NOTE — Progress Notes (Signed)
ANTICOAGULATION CONSULT NOTE  Pharmacy Consult:  Coumadin Indication: History of DVT/PE  No Known Allergies  Patient Measurements: Height: 5\' 10"  (177.8 cm) Weight: 221 lb 5.5 oz (100.4 kg) IBW/kg (Calculated) : 73  Vital Signs: Temp: 97.7 F (36.5 C) (06/16 0918) Temp Source: Oral (06/16 0918) BP: 145/100 (06/16 0924) Pulse Rate: 79 (06/16 0924)  Labs: Recent Labs    07/08/17 0508 07/09/17 0542 07/10/17 0934  HGB 12.6* 11.3* 11.5*  HCT 41.7 36.8* 37.5*  PLT 363 375 361  LABPROT 20.8* 21.3* 21.4*  INR 1.81 1.86 1.88  CREATININE 1.91* 1.62* 1.50*    Estimated Creatinine Clearance: 49.8 mL/min (A) (by C-G formula based on SCr of 1.5 mg/dL (H)).   Assessment: 52 YOM with history of DVT/PE presents to ED with left leg swelling and SOB.  Patient is on Coumadin PTA and INR subtherapeutic on admit.  Was subtherapeutic last week at 1.4 when seen at the Coumadin clinic. He was instructed to take 2 boosted doses over 2 days with no change to the overall weekly dose.   U/S negative for DVT, leg concerning for cellulitis.  VQ scan low probability for PE.  INR today remains SUBtherapeutic (INR 1.88 << 1.86, goal of 2-3). Hgb/Hct stable, plts wnl. Pt received boosted dose  Home Coumadin regimen: 3mg  daily except 1.5mg  on Thurs and Sun  Goal of Therapy:  INR 2-3 Monitor platelets by anticoagulation protocol: Yes   Plan:  - Warfarin 5 mg x 1 dose at 1800 today - Will continue to monitor for any signs/symptoms of bleeding and will follow up with PT/INR in the a.m.   Thank you for allowing pharmacy to be a part of this patient's care.  Alycia Rossetti, PharmD, BCPS Clinical Pharmacist Pager: [phone] Clinical phone for 07/10/2017 from 7a-3:30p: [phone] If after 3:30p, please call main pharmacy at: [phone] 07/10/2017 11:09 AM

## 2017-07-11 LAB — GLUCOSE, CAPILLARY
Glucose-Capillary: 77 mg/dL (ref 65–99)
Glucose-Capillary: 121 mg/dL — ABNORMAL HIGH (ref 65–99)

## 2017-07-11 LAB — PROTIME-INR
INR: 1.97
Prothrombin Time: 22.2 seconds — ABNORMAL HIGH (ref 11.4–15.2)

## 2017-07-11 LAB — CBC
HEMATOCRIT: 36.6 % — AB (ref 39.0–52.0)
Hemoglobin: 11.1 g/dL — ABNORMAL LOW (ref 13.0–17.0)
MCH: 25.2 pg — AB (ref 26.0–34.0)
MCHC: 30.3 g/dL (ref 30.0–36.0)
MCV: 83 fL (ref 78.0–100.0)
Platelets: 363 10*3/uL (ref 150–400)
RBC: 4.41 MIL/uL (ref 4.22–5.81)
RDW: 18.2 % — ABNORMAL HIGH (ref 11.5–15.5)
WBC: 14.8 10*3/uL — ABNORMAL HIGH (ref 4.0–10.5)

## 2017-07-11 MED ORDER — CEPHALEXIN 500 MG PO CAPS: 500.0000 mg | ORAL_CAPSULE | Freq: Four times a day (QID) | ORAL | 0 refills | Status: AC

## 2017-07-11 MED ORDER — FLUDROCORTISONE ACETATE 0.1 MG PO TABS: 0.1000 mg | ORAL_TABLET | Freq: Every day | ORAL | 1 refills | Status: DC

## 2017-07-11 MED ORDER — WARFARIN SODIUM 3 MG PO TABS
3.0000 mg | ORAL_TABLET | Freq: Once | ORAL | Status: DC
  Filled 2017-07-11: qty 1

## 2017-07-11 MED ORDER — FUROSEMIDE 40 MG PO TABS: 20.0000 mg | ORAL_TABLET | Freq: Every day | ORAL | 1 refills | Status: DC

## 2017-07-11 NOTE — Progress Notes (Signed)
   07/11/17 1000  Resting  Supplemental oxygen during test? No (O2 via [place] taken off)  Resting Heart Rate 81  Resting Sp02 87

## 2017-07-11 NOTE — Discharge Summary (Signed)
Physician Discharge Summary  [name] JJO:[id number] DOB: 21-Dec-1940 DOA: 07/07/2017  PCP: Lawerance Cruel, MD  Admit date: 07/07/2017 Discharge date: 07/11/2017  Admitted From: home Disposition:  home  Recommendations for Outpatient Follow-up:  1. Follow up with PCP in 1-2 weeks 2. Follow up with Dr. Chalmers Cater in 1-2 weeks  Home Health: PT Equipment/Devices: hospital bed, oxygen   Discharge Condition: stable CODE STATUS: Full code Diet recommendation: low salt, heart healthy, diabetic   HPI: Per Dr. Evangeline Gula 77 year old man presented with complaints of swelling and shortness of breath.  He is noncompliant with his home oxygen which he is supposed to wear 24/7.  We had extensive discussion regarding need for compliance and complications associated with chronic hypoxemia including brain damage and senility.  He was also found to have cellulitis of the left lower extremity will be admitted into the hospital for further evaluation and management of that  Hospital Course:  Left lower extremity cellulitis/swelling -patient initial complaint was left lower extremity swelling and redness.  He underwent an ultrasound which was negative for DVT.  He is already on Coumadin.Marland Kitchen He was placed on ceftriaxone cellulitis, he is erythema has resolved, his swelling is improved and he will be transition for cephalexin for 3 additional days to complete a 7-day course. Orthostasis / weakness -patient states that he has been very weak and lightheaded when he stands up for several weeks if not months.  He was felt to be dehydrated on admission due to elevated creatinine has received gentle hydration, his losartan and Lasix were held.  His kidney function is improved with fluids and returned to normal, in addition we could not continue IV fluids due to diastolic CHF.  He was euvolemic after initial hydration.  Despite that, he was persistently orthostatic, and in the light of Addison disease he was  started on Florinef.  His orthostasis improved by numbers as well as by symptoms.  Advised patient that may take few weeks for Florinef to be in full effect, and he needs to closely monitor his home blood pressure as well as monitor for signs and symptoms of fluid overload.  He was advised to call his primary endocrinologist ASAP for follow-up appointment in 1 to 2 weeks Chronic hypoxic respiratory failure -Supposed to be on 3 L nasal cannula at home, however he has been noncompliant with his home oxygen since he does not have small portable unit. CM consulted to assist. Chronic diastolic heart failure -He underwent a 2D echo in March 2019 which showed an EF of 50% with grade 1 diastolic dysfunction. Continue Coreg, I will hold losartan due to acute kidney injury.  I will decrease his Lasix to once daily.  He was advised for low-salt diet. Acute kidney injury superimposed on chronic kidney disease stage III -Baseline creatinine varies between 1.3-1.8, creatinine was over 2 on admission, improved to 1.5 with hydration.  Hold losartan as above HTN -Continue Coreg, monitor.  DM -continue home medications CAD status post PCI 2016  -No chest pain. Addison's disease  -Stable at baseline, continue steroids, added Florinef, advised to follow-up with Dr. Chalmers Cater as soon as possible Mild dementia -on admission patient unable to state month. Wife states he has "cognitive issues" that have been worsening over the last year.  Alert and oriented x4 during his hospital stay History of DVT/PE -Continue Coumadin, ultrasound without DVT, VQ scan without ventilation or perfusion defects    Discharge Diagnoses:  Principal Problem:   Cellulitis Active Problems:  Addison disease ([place])   Benign essential HTN   Chronic respiratory failure (HCC)   Chronic diastolic CHF (congestive heart failure) (HCC)   Chronic anticoagulation   Coronary artery disease involving native coronary artery of native heart without angina  pectoris   Diabetes mellitus without complication (HCC)   Respiratory distress   Acute kidney injury superimposed on chronic kidney disease ([place])   Dementia     Discharge Instructions   Allergies as of 07/11/2017   No Known Allergies     Medication List    STOP taking these medications   ibuprofen 200 MG tablet Commonly known as:  ADVIL,MOTRIN   losartan 25 MG tablet Commonly known as:  COZAAR     TAKE these medications   blood glucose meter kit and supplies Kit Dispense based on patient and insurance preference. Use up to four times daily as directed. (FOR ICD-9 250.00, 250.01).   CALCIUM PO Take 1 tablet by mouth daily.   carvedilol 6.25 MG tablet Commonly known as:  COREG Take 1 tablet (6.25 mg total) by mouth 2 (two) times daily with a meal.   cephALEXin 500 MG capsule Commonly known as:  KEFLEX Take 1 capsule (500 mg total) by mouth 4 (four) times daily for 3 days.   fludrocortisone 0.1 MG tablet Commonly known as:  FLORINEF Take 1 tablet (0.1 mg total) by mouth daily. Start taking on:  07/08/4313   folic acid 400 MCG tablet Commonly known as:  FOLVITE Take 400 mcg by mouth daily.   furosemide 40 MG tablet Commonly known as:  LASIX Take 0.5 tablets (20 mg total) by mouth daily. What changed:  when to take this   gabapentin 600 MG tablet Commonly known as:  NEURONTIN Take 600 mg by mouth 3 (three) times daily.   hydrocortisone 20 MG tablet Commonly known as:  CORTEF Take 10-20 mg by mouth See admin instructions. Take 20 mg by mouth in the morning then 10 mg at noon(time) then 20 mg at bedtime   Insulin Glargine 100 UNIT/ML Solostar Pen Commonly known as:  LANTUS Inject 20 Units into the skin daily at 10 pm. What changed:    how much to take  when to take this   Insulin Pen Needle 31G X 5 MM Misc Use with Lantus Pen   isosorbide mononitrate 30 MG 24 hr tablet Commonly known as:  IMDUR Take 0.5 tablets (15 mg total) by mouth daily.     living well with diabetes book Misc 1 book   metFORMIN 500 MG tablet Commonly known as:  GLUCOPHAGE Take 1 tablet (500 mg total) by mouth 2 (two) times daily with a meal.   metoCLOPramide 5 MG tablet Commonly known as:  REGLAN Take 5 mg by mouth daily.   nitroGLYCERIN 0.4 MG SL tablet Commonly known as:  NITROSTAT PLACE 1 TABLET UNDER TONGUE EVERY 5 MINUTES AS NEEDED FOR CHEST PAIN (UP TO 3 DOSES)   potassium chloride 10 MEQ tablet Commonly known as:  K-DUR,KLOR-CON Take 10 mEq by mouth daily.   PROAIR HFA 108 (90 Base) MCG/ACT inhaler Generic drug:  albuterol INHALE 2 PUFFS INTO THE LUNGS EVERY 6 (SIX) HOURS AS NEEDED FOR WHEEZING OR SHORTNESS OF BREATH.   promethazine 25 MG tablet Commonly known as:  PHENERGAN Take 25 mg by mouth every 6 (six) hours as needed for nausea.   thyroid 60 MG tablet Commonly known as:  ARMOUR Take 60 mg by mouth daily.   traMADol 50 MG tablet Commonly known  as:  ULTRAM Take 100 mg by mouth 3 (three) times daily.   traZODone 100 MG tablet Commonly known as:  DESYREL Take 100 mg by mouth at bedtime.   triamcinolone cream 0.1 % Commonly known as:  KENALOG APPLY TO AFFECTED AREA TWICE A DAY   UNABLE TO FIND Life Extension multivitamins: Take 1 tablet by mouth two times a day   vitamin B-12 1000 MCG tablet Commonly known as:  CYANOCOBALAMIN Take 1,000 mcg by mouth daily.   Vitamin D-3 1000 units Caps Take 1,000 Units by mouth daily.   VITAMIN E PO Take 1 capsule by mouth daily.   warfarin 3 MG tablet Commonly known as:  COUMADIN Take as directed. If you are unsure how to take this medication, talk to your nurse or doctor. Original instructions:  TAKE AS DIRECTED BY [place] What changed:  See the new instructions.            Durable Medical Equipment  (From admission, onward)        Start     Ordered   07/08/17 1616  For home use only DME Hospital bed  Once    Comments:  Patient with CHF, chronic hypoxia,  requires ability to reposition frequently, sleeping @ 45 degrees  Question Answer Comment  The above medical condition requires: Patient requires the ability to reposition frequently   Head must be elevated greater than: 45 degrees   Bed type Semi-electric      07/08/17 1616   07/08/17 1330  For home use only DME oxygen  Once    Question Answer Comment  Mode or (Route) Nasal cannula   Liters per Minute 3   Frequency Continuous (stationary and portable oxygen unit needed)   Oxygen conserving device Yes   Oxygen delivery system Gas      07/08/17 1329   07/08/17 1328  For home use only DME Hospital bed  Once    Question:  Bed type  Answer:  Semi-electric   07/08/17 1329   07/08/17 1326  For home use only DME oxygen  Once    Question Answer Comment  Mode or (Route) Nasal cannula   Liters per Minute 3   Frequency Continuous (stationary and portable oxygen unit needed)   Oxygen conserving device Yes   Oxygen delivery system Gas      07/08/17 1326   07/08/17 1326  For home use only DME Hospital bed  Once    Question Answer Comment  Patient has (list medical condition): 45 degrees for heart failure   Head must be elevated greater than: 45 degrees   Bed type Semi-electric      07/08/17 1326   07/08/17 1325  For home use only DME Walker rolling  Once    Question:  Patient needs a walker to treat with the following condition  Answer:  Generalized weakness   07/08/17 1329     Follow-up Information    Home, [facility] Follow up.   Specialty:  [place] Why:  A representative from [facility] will contact you to arrange start date and time for your therapy. Contact information: [address] [place] [address] [phone]        Lawerance Cruel, MD. Schedule an appointment as soon as possible for a visit in 1 week(s).   Specialty:  Family Medicine Contact information: [address] [place] [address] [phone]        [name],  Wonda Cheng,  MD .   Specialty:  Cardiology Contact information: [place] [address] [address] [phone]        Jacelyn Pi, MD. Schedule an appointment as soon as possible for a visit in 1 week(s).   Specialty:  Endocrinology Contact information: [address] [place] [address] [phone]           Consultations:  None  Procedures/Studies:  Dg Chest 2 View  Result Date: 07/07/2017 CLINICAL DATA:  Shortness of breath, dizziness, weakness, RIGHT rib pain, history chronic diastolic CHF, Addison's disease, stage III chronic kidney disease, coronary artery disease, pulmonary hypertension, prior pulmonary embolism, hypertension EXAM: CHEST - 2 VIEW COMPARISON:  04/22/2017 FINDINGS: Normal heart size, mediastinal contours, and pulmonary vascularity. Atherosclerotic calcification aorta. Chronic atelectasis versus scarring at RIGHT base. Minimal bibasilar atelectasis versus scarring unchanged. No acute infiltrate, pleural effusion or pneumothorax. IMPRESSION: Minimal bibasilar atelectasis versus scarring. No acute abnormalities. Electronically Signed   By: Lavonia Dana M.D.   On: 07/07/2017 08:05   Nm Pulmonary Vent And Perf (v/q Scan)  Result Date: 07/07/2017 CLINICAL DATA:  Shortness of Breath EXAM: NUCLEAR MEDICINE VENTILATION - PERFUSION LUNG SCAN VIEWS: Anterior, posterior, left lateral, right lateral, RPO, LPO, RAO, LAO-ventilation and perfusion RADIOPHARMACEUTICALS:  32.6 mCi of Tc-52mDTPA aerosol inhalation and 4.2 mCi Tc950mAA IV COMPARISON:  Chest radiograph July 07, 2017; ventilation perfusion lung scan examination May 31, 2016 FINDINGS: Ventilation: Radiotracer uptake bilaterally is homogeneous and symmetric. No ventilation defects are identified. Perfusion: Radiotracer uptake bilaterally is homogeneous and symmetric. No perfusion defects are identified. IMPRESSION: No appreciable ventilation or perfusion defects. No appreciable  change from prior study. This study constitutes a very low probability of pulmonary embolus. Electronically Signed   By: WiLowella GripII M.D.   On: 07/07/2017 14:12      Subjective: - no chest pain, shortness of breath, no abdominal pain, nausea or vomiting.   Discharge Exam: Vitals:   07/11/17 0910 07/11/17 0914  BP: 137/87 (!) 157/99  Pulse: 90 73  Resp:    Temp:    SpO2: 98% 98%    General: Pt is alert, awake, not in acute distress Cardiovascular: RRR, S1/S2 +, no rubs, no gallops Respiratory: CTA bilaterally, no wheezing, no rhonchi Abdominal: Soft, NT, ND, bowel sounds + Extremities: no edema, no cyanosis    The results of significant diagnostics from this hospitalization (including imaging, microbiology, ancillary and laboratory) are listed below for reference.     Microbiology: Recent Results (from the past 240 hour(s))  Culture, blood (routine x 2)     Status: None (Preliminary result)   Collection Time: 07/07/17  7:40 PM  Result Value Ref Range Status   Specimen Description BLOOD RIGHT HAND  Final   Special Requests   Final    BOTTLES DRAWN AEROBIC AND ANAEROBIC Blood Culture adequate volume   Culture   Final    NO GROWTH 4 DAYS Performed at [facility] [place] [id number]  Report Status PENDING  Incomplete  Culture, blood (routine x 2)     Status: None (Preliminary result)   Collection Time: 07/07/17  8:01 PM  Result Value Ref Range Status   Specimen Description BLOOD LEFT HAND  Final   Special Requests   Final    BOTTLES DRAWN AEROBIC AND ANAEROBIC Blood Culture results may not be optimal due to an inadequate volume of blood received in culture bottles   Culture  Final    NO GROWTH 4 DAYS Performed at [facility], [place] [address] [place], [place] [address]    Report Status PENDING  Incomplete  MRSA PCR Screening     Status: Abnormal   Collection Time: 07/09/17  2:17 PM  Result Value Ref Range Status    MRSA by PCR POSITIVE (A) NEGATIVE Final    Comment:        The GeneXpert MRSA Assay (FDA approved for NASAL specimens only), is one component of a comprehensive MRSA colonization surveillance program. It is not intended to diagnose MRSA infection nor to guide or monitor treatment for MRSA infections. RESULT CALLED TO, READ BACK BY AND VERIFIED WITH: RN Renato Battles [id number] MLM Performed at [facility], [address] [place], [place] [address]      Labs: BNP (last 3 results) Recent Labs    04/22/17 2013 07/07/17 0750  BNP 35.3 25.8   Basic Metabolic Panel: Recent Labs  Lab 07/07/17 0750 07/07/17 1942 07/08/17 0508 07/09/17 0542 07/10/17 0934  NA 137  --  136 136 136  K 3.1*  --  3.5 4.7 4.3  CL 104  --  105 106 108  CO2 18*  --  21* 20* 21*  GLUCOSE 153*  --  98 118* 205*  BUN 30*  --  25* 20 16  CREATININE 2.10*  --  1.91* 1.62* 1.50*  CALCIUM 8.4*  --  8.2* 8.3* 8.4*  MG  --  2.2  --   --   --    Liver Function Tests: Recent Labs  Lab 07/07/17 0853  AST 36  ALT 50  ALKPHOS 76  BILITOT 0.5  PROT 6.6  ALBUMIN 3.1*   No results for input(s): LIPASE, AMYLASE in the last 168 hours. No results for input(s): AMMONIA in the last 168 hours. CBC: Recent Labs  Lab 07/07/17 0750 07/08/17 0508 07/09/17 0542 07/10/17 0934 07/11/17 0230  WBC 16.7* 14.1* 15.4* 14.9* 14.8*  HGB 13.1 12.6* 11.3* 11.5* 11.1*  HCT 42.6 41.7 36.8* 37.5* 36.6*  MCV 81.8 82.4 81.6 82.6 83.0  PLT 421* 363 375 361 363   Cardiac Enzymes: No results for input(s): CKTOTAL, CKMB, CKMBINDEX, TROPONINI in the last 168 hours. BNP: Invalid input(s): POCBNP CBG: Recent Labs  Lab 07/10/17 1156 07/10/17 1645 07/10/17 2028 07/11/17 0647 07/11/17 1152  GLUCAP 173* 213* 171* 77 121*   D-Dimer No results for input(s): DDIMER in the last 72 hours. Hgb A1c No results for input(s): HGBA1C in the last 72 hours. Lipid Profile No results for input(s): CHOL, HDL, LDLCALC, TRIG,  CHOLHDL, LDLDIRECT in the last 72 hours. Thyroid function studies Recent Labs    07/09/17 0542  TSH 2.237   Anemia work up No results for input(s): VITAMINB12, FOLATE, FERRITIN, TIBC, IRON, RETICCTPCT in the last 72 hours. Urinalysis    Component Value Date/Time   COLORURINE YELLOW 04/22/2017 1649   APPEARANCEUR HAZY (A) 04/22/2017 1649   LABSPEC 1.014 04/22/2017 1649   PHURINE 6.0 04/22/2017 1649   GLUCOSEU >=500 (A) 04/22/2017 1649   HGBUR NEGATIVE 04/22/2017 1649   BILIRUBINUR NEGATIVE 04/22/2017 1649   KETONESUR NEGATIVE 04/22/2017 1649   PROTEINUR NEGATIVE 04/22/2017 1649   UROBILINOGEN 0.2 03/21/2007 1015   NITRITE NEGATIVE 04/22/2017 1649   LEUKOCYTESUR NEGATIVE 04/22/2017 1649   Sepsis Labs Invalid input(s): PROCALCITONIN,  WBC,  LACTICIDVEN   Time coordinating discharge: 45 minutes  SIGNED:  Marzetta Board, MD  [facility] 07/11/2017, 2:19 PM  Pager [phone]  If 7PM-7AM, please contact night-coverage [email or web address] Password [email or web address]

## 2017-07-11 NOTE — Progress Notes (Signed)
SATURATION QUALIFICATIONS: (This note is used to comply with regulatory documentation for home oxygen)  Patient Saturations on Room Air at Rest =87%  Patient Saturations on Room Air while Ambulating = NA  Patient Saturations on NA Liters of oxygen while Ambulating = NA  Please briefly explain why patient needs home oxygen:

## 2017-07-11 NOTE — Progress Notes (Signed)
ANTICOAGULATION CONSULT NOTE  Pharmacy Consult:  Coumadin Indication: History of DVT/PE  No Known Allergies  Patient Measurements: Height: 5\' 10"  (177.8 cm) Weight: 222 lb 14.2 oz (101.1 kg) IBW/kg (Calculated) : 73  Vital Signs: Temp: 98.4 F (36.9 C) (06/17 0907) Temp Source: Oral (06/17 0907) BP: 137/87 (06/17 0910) Pulse Rate: 90 (06/17 0910)  Labs: Recent Labs    07/09/17 0542 07/10/17 0934 07/11/17 0230  HGB 11.3* 11.5* 11.1*  HCT 36.8* 37.5* 36.6*  PLT 375 361 363  LABPROT 21.3* 21.4* 22.2*  INR 1.86 1.88 1.97  CREATININE 1.62* 1.50*  --     Estimated Creatinine Clearance: 49.9 mL/min (A) (by C-G formula based on SCr of 1.5 mg/dL (H)).   Assessment: 9 YOM with history of DVT/PE presents to ED with left leg swelling and SOB.  Patient is on Coumadin PTA and INR subtherapeutic on admit.  Was subtherapeutic last week at 1.4 when seen at the Coumadin clinic. He was instructed to take 2 boosted doses over 2 days with no change to the overall weekly dose.   U/S negative for DVT, leg concerning for cellulitis.  VQ scan low probability for PE.  INR today remains slightly SUB-therapeutic, but rising (INR 1.97, goal of 2-3). Hgb/Hct stable, plts wnl. Pt received boosted dose on 6/15 and 6/16.  Home Coumadin regimen: 3mg  daily except 1.5mg  on Thurs and Sun  Goal of Therapy:  INR 2-3 Monitor platelets by anticoagulation protocol: Yes   Plan:  - Warfarin 3 mg x 1 dose at 1800 today *Suspect can return to normal home dose if to discharge today with follow-up INR later this week - Will continue to monitor for any signs/symptoms of bleeding and will follow up with PT/INR in the a.m.  *Please be cognizant of drug interactions when selecting oral antibiotics for outpatient therapy. Recommend Keflex if possible.   Thank you for allowing pharmacy to be a part of this patient's care.  Sloan Leiter, PharmD, BCPS, BCCCP Clinical Pharmacist Clinical phone 07/11/2017 until  3:30PM [phone] After hours, please call #[phone] 07/11/2017 9:13 AM

## 2017-07-12 LAB — CULTURE, BLOOD (ROUTINE X 2)
Culture: NO GROWTH
Culture: NO GROWTH
SPECIAL REQUESTS: ADEQUATE

## 2017-07-24 ENCOUNTER — Other Ambulatory Visit: Payer: Self-pay | Admitting: Cardiovascular Disease

## 2017-08-01 ENCOUNTER — Ambulatory Visit (INDEPENDENT_AMBULATORY_CARE_PROVIDER_SITE_OTHER): Payer: Medicare HMO | Admitting: *Deleted

## 2017-08-01 DIAGNOSIS — Z86711 Personal history of pulmonary embolism: Secondary | ICD-10-CM

## 2017-08-01 DIAGNOSIS — Z5181 Encounter for therapeutic drug level monitoring: Secondary | ICD-10-CM | POA: Diagnosis not present

## 2017-08-01 LAB — POCT INR: INR: 1.9 — AB (ref 2.0–3.0)

## 2017-08-01 MED ORDER — WARFARIN SODIUM 3 MG PO TABS: ORAL_TABLET | ORAL | 0 refills | Status: DC

## 2017-08-01 NOTE — Patient Instructions (Signed)
Description   Today July 8th take 1 and 1/2 tablets then change dose of coumadin to 1 tablet daily except 1/2 tablet only on  Thursdays.  Recheck in 2 weeks.

## 2017-08-02 DIAGNOSIS — L03125 Acute lymphangitis of right lower limb: Secondary | ICD-10-CM | POA: Diagnosis not present

## 2017-08-10 DIAGNOSIS — I5032 Chronic diastolic (congestive) heart failure: Secondary | ICD-10-CM | POA: Diagnosis not present

## 2017-08-10 DIAGNOSIS — J449 Chronic obstructive pulmonary disease, unspecified: Secondary | ICD-10-CM | POA: Diagnosis not present

## 2017-08-10 DIAGNOSIS — R0902 Hypoxemia: Secondary | ICD-10-CM | POA: Diagnosis not present

## 2017-08-11 DIAGNOSIS — R531 Weakness: Secondary | ICD-10-CM | POA: Diagnosis not present

## 2017-08-11 DIAGNOSIS — R0902 Hypoxemia: Secondary | ICD-10-CM | POA: Diagnosis not present

## 2017-08-11 DIAGNOSIS — M25472 Effusion, left ankle: Secondary | ICD-10-CM | POA: Diagnosis not present

## 2017-08-11 DIAGNOSIS — I5032 Chronic diastolic (congestive) heart failure: Secondary | ICD-10-CM | POA: Diagnosis not present

## 2017-08-11 DIAGNOSIS — J449 Chronic obstructive pulmonary disease, unspecified: Secondary | ICD-10-CM | POA: Diagnosis not present

## 2017-08-15 DIAGNOSIS — I2510 Atherosclerotic heart disease of native coronary artery without angina pectoris: Secondary | ICD-10-CM | POA: Diagnosis not present

## 2017-08-15 DIAGNOSIS — N189 Chronic kidney disease, unspecified: Secondary | ICD-10-CM | POA: Diagnosis not present

## 2017-08-15 DIAGNOSIS — M179 Osteoarthritis of knee, unspecified: Secondary | ICD-10-CM | POA: Diagnosis not present

## 2017-08-15 DIAGNOSIS — I509 Heart failure, unspecified: Secondary | ICD-10-CM | POA: Diagnosis not present

## 2017-08-15 DIAGNOSIS — E039 Hypothyroidism, unspecified: Secondary | ICD-10-CM | POA: Diagnosis not present

## 2017-08-15 DIAGNOSIS — I10 Essential (primary) hypertension: Secondary | ICD-10-CM | POA: Diagnosis not present

## 2017-08-15 DIAGNOSIS — M47812 Spondylosis without myelopathy or radiculopathy, cervical region: Secondary | ICD-10-CM | POA: Diagnosis not present

## 2017-08-15 DIAGNOSIS — J45909 Unspecified asthma, uncomplicated: Secondary | ICD-10-CM | POA: Diagnosis not present

## 2017-08-27 ENCOUNTER — Other Ambulatory Visit: Payer: Self-pay | Admitting: Cardiovascular Disease

## 2017-09-10 DIAGNOSIS — I5032 Chronic diastolic (congestive) heart failure: Secondary | ICD-10-CM | POA: Diagnosis not present

## 2017-09-10 DIAGNOSIS — J449 Chronic obstructive pulmonary disease, unspecified: Secondary | ICD-10-CM | POA: Diagnosis not present

## 2017-09-10 DIAGNOSIS — R0902 Hypoxemia: Secondary | ICD-10-CM | POA: Diagnosis not present

## 2017-09-12 ENCOUNTER — Ambulatory Visit (INDEPENDENT_AMBULATORY_CARE_PROVIDER_SITE_OTHER): Payer: Medicare HMO | Admitting: Pharmacist

## 2017-09-12 DIAGNOSIS — Z5181 Encounter for therapeutic drug level monitoring: Secondary | ICD-10-CM | POA: Diagnosis not present

## 2017-09-12 LAB — POCT INR: INR: 2.7 (ref 2.0–3.0)

## 2017-09-12 NOTE — Patient Instructions (Addendum)
  Description   Continue taking coumadin 1 tablet daily except 1/2 tablet only on Sundays.  Recheck in 3 weeks.

## 2017-09-29 DIAGNOSIS — D649 Anemia, unspecified: Secondary | ICD-10-CM | POA: Diagnosis not present

## 2017-09-29 DIAGNOSIS — E1169 Type 2 diabetes mellitus with other specified complication: Secondary | ICD-10-CM | POA: Diagnosis not present

## 2017-09-29 DIAGNOSIS — R531 Weakness: Secondary | ICD-10-CM | POA: Diagnosis not present

## 2017-09-29 DIAGNOSIS — N183 Chronic kidney disease, stage 3 (moderate): Secondary | ICD-10-CM | POA: Diagnosis not present

## 2017-09-29 DIAGNOSIS — E291 Testicular hypofunction: Secondary | ICD-10-CM | POA: Diagnosis not present

## 2017-10-04 ENCOUNTER — Ambulatory Visit: Payer: Medicare HMO | Admitting: Neurology

## 2017-10-06 ENCOUNTER — Ambulatory Visit: Payer: 59 | Admitting: Neurology

## 2017-10-06 ENCOUNTER — Encounter

## 2017-10-08 ENCOUNTER — Other Ambulatory Visit: Payer: Self-pay | Admitting: Cardiovascular Disease

## 2017-10-10 ENCOUNTER — Telehealth: Payer: Self-pay | Admitting: Cardiovascular Disease

## 2017-10-10 NOTE — Telephone Encounter (Signed)
Spoke with patient who states patient fell recently and was seen by Dr. Harrington Challenger, PCP. He has another appointment with Dr. Harrington Challenger tomorrow but was advised to call cardiology for a follow-up. Patient was last seen by HF clinic in 8/18 and was advised to f/u in 4 months. I asked if she has called them for an appointment and she denies. States she had gotten confused about which office to call but will call HF clinic tomorrow. She thanked me for the call.

## 2017-10-10 NOTE — Telephone Encounter (Signed)
New Message:     Patient wife states he is not able to walk and requesting a call back

## 2017-10-11 DIAGNOSIS — R0902 Hypoxemia: Secondary | ICD-10-CM | POA: Diagnosis not present

## 2017-10-11 DIAGNOSIS — I5032 Chronic diastolic (congestive) heart failure: Secondary | ICD-10-CM | POA: Diagnosis not present

## 2017-10-11 DIAGNOSIS — J449 Chronic obstructive pulmonary disease, unspecified: Secondary | ICD-10-CM | POA: Diagnosis not present

## 2017-10-12 DIAGNOSIS — E1169 Type 2 diabetes mellitus with other specified complication: Secondary | ICD-10-CM | POA: Diagnosis not present

## 2017-10-12 DIAGNOSIS — I2510 Atherosclerotic heart disease of native coronary artery without angina pectoris: Secondary | ICD-10-CM | POA: Diagnosis not present

## 2017-10-12 DIAGNOSIS — N183 Chronic kidney disease, stage 3 (moderate): Secondary | ICD-10-CM | POA: Diagnosis not present

## 2017-10-12 DIAGNOSIS — D500 Iron deficiency anemia secondary to blood loss (chronic): Secondary | ICD-10-CM | POA: Diagnosis not present

## 2017-10-12 DIAGNOSIS — F0390 Unspecified dementia without behavioral disturbance: Secondary | ICD-10-CM | POA: Diagnosis not present

## 2017-10-12 DIAGNOSIS — I10 Essential (primary) hypertension: Secondary | ICD-10-CM | POA: Diagnosis not present

## 2017-10-12 DIAGNOSIS — E039 Hypothyroidism, unspecified: Secondary | ICD-10-CM | POA: Diagnosis not present

## 2017-10-12 DIAGNOSIS — I509 Heart failure, unspecified: Secondary | ICD-10-CM | POA: Diagnosis not present

## 2017-10-13 ENCOUNTER — Encounter: Payer: Self-pay | Admitting: Oncology

## 2017-10-13 ENCOUNTER — Telehealth: Payer: Self-pay | Admitting: Oncology

## 2017-10-13 NOTE — Telephone Encounter (Signed)
New hematology referral received from Dr. Harrington Challenger at [facility]. Pt has been scheduled to see Dr. Alen Blew on 10/3 at 2:15pm. Appt date and time given to the pt's wife. Aware to arrive 30 minutes early. Letter mailed.

## 2017-10-17 ENCOUNTER — Telehealth: Payer: Self-pay

## 2017-10-17 NOTE — Telephone Encounter (Signed)
Phone call placed to patient. Unable to leave message

## 2017-10-17 NOTE — Telephone Encounter (Signed)
VM left with call back information.

## 2017-10-26 ENCOUNTER — Telehealth: Payer: Self-pay

## 2017-10-26 ENCOUNTER — Telehealth: Payer: Self-pay | Admitting: Oncology

## 2017-10-26 NOTE — Telephone Encounter (Signed)
[name] wife cld to cancel. States the pt is having a hard time walking due to neuropathy, even with a cane. Mrs. Gargis will call back to reschedule once she figures out how to get him to our office.

## 2017-10-26 NOTE — Telephone Encounter (Signed)
Phone call placed to patient's wife to offer to schedule visit with Palliative Care. VM left.

## 2017-10-27 ENCOUNTER — Inpatient Hospital Stay: Payer: Medicare HMO | Admitting: Oncology

## 2017-10-28 ENCOUNTER — Telehealth: Payer: Self-pay | Admitting: Cardiovascular Disease

## 2017-10-28 DIAGNOSIS — R630 Anorexia: Secondary | ICD-10-CM | POA: Diagnosis not present

## 2017-10-28 DIAGNOSIS — R634 Abnormal weight loss: Secondary | ICD-10-CM | POA: Diagnosis not present

## 2017-10-28 DIAGNOSIS — R5381 Other malaise: Secondary | ICD-10-CM | POA: Diagnosis not present

## 2017-10-28 DIAGNOSIS — I2699 Other pulmonary embolism without acute cor pulmonale: Secondary | ICD-10-CM | POA: Diagnosis not present

## 2017-10-28 DIAGNOSIS — R609 Edema, unspecified: Secondary | ICD-10-CM | POA: Diagnosis not present

## 2017-10-28 DIAGNOSIS — R064 Hyperventilation: Secondary | ICD-10-CM | POA: Diagnosis not present

## 2017-10-28 DIAGNOSIS — Z7901 Long term (current) use of anticoagulants: Secondary | ICD-10-CM | POA: Diagnosis not present

## 2017-10-28 LAB — PROTIME-INR: INR: 1.6 — AB (ref 0.9–1.1)

## 2017-10-28 MED ORDER — WARFARIN SODIUM 3 MG PO TABS: ORAL_TABLET | ORAL | 0 refills | Status: DC

## 2017-10-28 NOTE — Telephone Encounter (Signed)
New Message:     *STAT* If patient is at the pharmacy, call can be transferred to refill team.   1. Which medications need to be refilled? (please list name of each medication and dose if known)   warfarin (COUMADIN) 3 MG tablet   2. Which pharmacy/location (including street and city if local pharmacy) is medication to be sent to? warfarin (COUMADIN) 3 MG tablet  3. Do they need a 30 day or 90 day supply? 30 Days

## 2017-10-28 NOTE — Telephone Encounter (Signed)
Called spoke with pt's wife, no results received from [place] [facility] as of 4:51pm on Friday 10/28/17.  Advised to have pt take 1.5 tablets today, then resume same dosage 1 tablet daily except 1/2 tablet on Sundays.  Will call next week and officially dose result, and schedule follow-up appt. Will refill small amount of Coumadin to get through the weekend until result received.  [name] wife verbalized understanding and will await our call back next week once we receive INR result from Dr Alan Ripper office.

## 2017-10-28 NOTE — Telephone Encounter (Addendum)
Returned call to pt's wife, she states pt went to his primary care office and saw Dr Harrington Challenger today.  They did a finger stick INR per her report and the INR was 1.6.  They advised pt's wife they were faxing results to our Coumadin Clinic no results received.  [name] wife is going to call [facility] to get results faxed to [place]. She states pt only has 1.5 tablets of Warfarin left at home.  Advised we have not refilled secondary to him being 2 months overdue for follow-up.  Will await INR results.

## 2017-10-28 NOTE — Telephone Encounter (Signed)
2 months overdue for follow-up, missed appt today in Coumadin Clinic.

## 2017-10-28 NOTE — Telephone Encounter (Signed)
New Message     Patient has a question on what is the dosage for :   warfarin (COUMADIN) 3 MG tablet

## 2017-10-31 ENCOUNTER — Ambulatory Visit (INDEPENDENT_AMBULATORY_CARE_PROVIDER_SITE_OTHER): Payer: Medicare HMO | Admitting: Cardiology

## 2017-10-31 DIAGNOSIS — Z5181 Encounter for therapeutic drug level monitoring: Secondary | ICD-10-CM

## 2017-10-31 NOTE — Telephone Encounter (Signed)
Spoke with patient's wife and he has enough warfarin to last until INR check scheduled for this Friday.

## 2017-10-31 NOTE — Patient Instructions (Signed)
Description   INR checked at [facility] on 10/4, was 2 months overdue to see [place]. Pt was advised to take 1.5 tablets on Friday, then continue taking coumadin 1 tablet daily except 1/2 tablet only on Sundays.  Recheck INR in 1 week.

## 2017-10-31 NOTE — Telephone Encounter (Signed)
Hard copy of INR from Friday has been received. F/u scheduled in Coumadin clinic for this Friday.

## 2017-11-01 ENCOUNTER — Telehealth: Payer: Self-pay

## 2017-11-01 NOTE — Telephone Encounter (Signed)
VM left to schedule Palliative Care

## 2017-11-04 ENCOUNTER — Ambulatory Visit (INDEPENDENT_AMBULATORY_CARE_PROVIDER_SITE_OTHER): Payer: Medicare HMO | Admitting: Pharmacist

## 2017-11-04 DIAGNOSIS — Z5181 Encounter for therapeutic drug level monitoring: Secondary | ICD-10-CM | POA: Diagnosis not present

## 2017-11-04 LAB — POCT INR: INR: 2.9 (ref 2.0–3.0)

## 2017-11-04 MED ORDER — WARFARIN SODIUM 3 MG PO TABS: ORAL_TABLET | ORAL | 1 refills | Status: DC

## 2017-11-04 NOTE — Patient Instructions (Signed)
Description   Continue taking coumadin 1 tablet daily except 1/2 tablet only on Sundays.  Recheck INR in 2 week.

## 2017-11-10 ENCOUNTER — Telehealth: Payer: Self-pay

## 2017-11-10 DIAGNOSIS — I5032 Chronic diastolic (congestive) heart failure: Secondary | ICD-10-CM | POA: Diagnosis not present

## 2017-11-10 DIAGNOSIS — R0902 Hypoxemia: Secondary | ICD-10-CM | POA: Diagnosis not present

## 2017-11-10 DIAGNOSIS — J449 Chronic obstructive pulmonary disease, unspecified: Secondary | ICD-10-CM | POA: Diagnosis not present

## 2017-11-10 NOTE — Telephone Encounter (Signed)
Received a return voice message from patient's wife requesting to schedule a visit with Palliative Care for Friday afternoon. Returned call to patient's wife. VM left for patient's wife offering a visit this Friday at 3:30pm

## 2017-11-11 ENCOUNTER — Telehealth: Payer: Self-pay

## 2017-11-11 NOTE — Telephone Encounter (Signed)
Received a call back from patient's wife to schedule a visit with Palliative Care. Scheduled for 11/18/17.

## 2017-11-15 ENCOUNTER — Other Ambulatory Visit: Payer: Self-pay

## 2017-11-15 ENCOUNTER — Emergency Department (HOSPITAL_COMMUNITY)
Admission: EM | Admit: 2017-11-15 | Discharge: 2017-11-15 | Disposition: A | Discharge status: Home / Self Care | Payer: Medicare HMO | Attending: Emergency Medicine | Admitting: Emergency Medicine

## 2017-11-15 ENCOUNTER — Emergency Department (HOSPITAL_COMMUNITY): Payer: Medicare HMO

## 2017-11-15 ENCOUNTER — Encounter (HOSPITAL_COMMUNITY): Payer: Self-pay | Admitting: Emergency Medicine

## 2017-11-15 DIAGNOSIS — M5489 Other dorsalgia: Secondary | ICD-10-CM | POA: Diagnosis not present

## 2017-11-15 DIAGNOSIS — Z794 Long term (current) use of insulin: Secondary | ICD-10-CM | POA: Insufficient documentation

## 2017-11-15 DIAGNOSIS — Z9181 History of falling: Secondary | ICD-10-CM | POA: Insufficient documentation

## 2017-11-15 DIAGNOSIS — I2510 Atherosclerotic heart disease of native coronary artery without angina pectoris: Secondary | ICD-10-CM | POA: Diagnosis not present

## 2017-11-15 DIAGNOSIS — E1122 Type 2 diabetes mellitus with diabetic chronic kidney disease: Secondary | ICD-10-CM | POA: Diagnosis not present

## 2017-11-15 DIAGNOSIS — M545 Low back pain, unspecified: Secondary | ICD-10-CM

## 2017-11-15 DIAGNOSIS — Z7901 Long term (current) use of anticoagulants: Secondary | ICD-10-CM | POA: Insufficient documentation

## 2017-11-15 DIAGNOSIS — R531 Weakness: Secondary | ICD-10-CM | POA: Diagnosis not present

## 2017-11-15 DIAGNOSIS — Z9981 Dependence on supplemental oxygen: Secondary | ICD-10-CM | POA: Insufficient documentation

## 2017-11-15 DIAGNOSIS — Z79899 Other long term (current) drug therapy: Secondary | ICD-10-CM | POA: Insufficient documentation

## 2017-11-15 DIAGNOSIS — I959 Hypotension, unspecified: Secondary | ICD-10-CM | POA: Diagnosis not present

## 2017-11-15 DIAGNOSIS — R404 Transient alteration of awareness: Secondary | ICD-10-CM | POA: Diagnosis not present

## 2017-11-15 DIAGNOSIS — I5032 Chronic diastolic (congestive) heart failure: Secondary | ICD-10-CM | POA: Insufficient documentation

## 2017-11-15 DIAGNOSIS — I130 Hypertensive heart and chronic kidney disease with heart failure and stage 1 through stage 4 chronic kidney disease, or unspecified chronic kidney disease: Secondary | ICD-10-CM | POA: Diagnosis not present

## 2017-11-15 DIAGNOSIS — N183 Chronic kidney disease, stage 3 (moderate): Secondary | ICD-10-CM | POA: Diagnosis not present

## 2017-11-15 DIAGNOSIS — R0902 Hypoxemia: Secondary | ICD-10-CM | POA: Diagnosis not present

## 2017-11-15 LAB — URINALYSIS, ROUTINE W REFLEX MICROSCOPIC
Bilirubin Urine: NEGATIVE
Glucose, UA: NEGATIVE mg/dL
Hgb urine dipstick: NEGATIVE
Ketones, ur: NEGATIVE mg/dL
Leukocytes, UA: NEGATIVE
Nitrite: NEGATIVE
Protein, ur: NEGATIVE mg/dL
Specific Gravity, Urine: 1.017 (ref 1.005–1.030)
pH: 6 (ref 5.0–8.0)

## 2017-11-15 LAB — BASIC METABOLIC PANEL
Anion gap: 10 (ref 5–15)
BUN: 22 mg/dL (ref 8–23)
CO2: 28 mmol/L (ref 22–32)
Calcium: 8 mg/dL — ABNORMAL LOW (ref 8.9–10.3)
Chloride: 105 mmol/L (ref 98–111)
Creatinine, Ser: 1.68 mg/dL — ABNORMAL HIGH (ref 0.61–1.24)
GFR calc Af Amer: 44 mL/min — ABNORMAL LOW (ref >60)
GFR calc non Af Amer: 38 mL/min — ABNORMAL LOW (ref >60)
Glucose, Bld: 138 mg/dL — ABNORMAL HIGH (ref 70–99)
Potassium: 3.1 mmol/L — ABNORMAL LOW (ref 3.5–5.1)
Sodium: 143 mmol/L (ref 135–145)

## 2017-11-15 LAB — CBC WITH DIFFERENTIAL/PLATELET
Abs Immature Granulocytes: 0.08 10*3/uL — ABNORMAL HIGH (ref 0.00–0.07)
Basophils Absolute: 0.1 10*3/uL (ref 0.0–0.1)
Basophils Relative: 0 %
Eosinophils Absolute: 0.3 10*3/uL (ref 0.0–0.5)
Eosinophils Relative: 2 %
HCT: 39.3 % (ref 39.0–52.0)
Hemoglobin: 11.9 g/dL — ABNORMAL LOW (ref 13.0–17.0)
Immature Granulocytes: 1 %
Lymphocytes Relative: 18 %
Lymphs Abs: 3.1 10*3/uL (ref 0.7–4.0)
MCH: 24.6 pg — ABNORMAL LOW (ref 26.0–34.0)
MCHC: 30.3 g/dL (ref 30.0–36.0)
MCV: 81.2 fL (ref 80.0–100.0)
Monocytes Absolute: 1.4 10*3/uL — ABNORMAL HIGH (ref 0.1–1.0)
Monocytes Relative: 8 %
Neutro Abs: 12.1 10*3/uL — ABNORMAL HIGH (ref 1.7–7.7)
Neutrophils Relative %: 71 %
Platelets: 391 10*3/uL (ref 150–400)
RBC: 4.84 MIL/uL (ref 4.22–5.81)
RDW: 17.7 % — ABNORMAL HIGH (ref 11.5–15.5)
WBC: 17.1 10*3/uL — ABNORMAL HIGH (ref 4.0–10.5)
nRBC: 0 % (ref 0.0–0.2)

## 2017-11-15 LAB — PROTIME-INR
INR: 1.42
Prothrombin Time: 17.2 seconds — ABNORMAL HIGH (ref 11.4–15.2)

## 2017-11-15 MED ORDER — HYDROMORPHONE HCL 1 MG/ML IJ SOLN
0.7500 mg | Freq: Once | INTRAMUSCULAR | Status: AC
  Administered 2017-11-15: 0.75 mg via INTRAVENOUS
  Filled 2017-11-15: qty 1

## 2017-11-15 MED ORDER — HYDROMORPHONE HCL 1 MG/ML IJ SOLN
1.0000 mg | Freq: Once | INTRAMUSCULAR | Status: AC
  Administered 2017-11-15: 1 mg via INTRAVENOUS
  Filled 2017-11-15: qty 1

## 2017-11-15 MED ORDER — DEXAMETHASONE SODIUM PHOSPHATE 10 MG/ML IJ SOLN
10.0000 mg | Freq: Once | INTRAMUSCULAR | Status: AC
  Administered 2017-11-15: 10 mg via INTRAVENOUS
  Filled 2017-11-15: qty 1

## 2017-11-15 MED ORDER — GABAPENTIN 300 MG PO CAPS
300.0000 mg | ORAL_CAPSULE | Freq: Once | ORAL | Status: AC
  Administered 2017-11-15: 300 mg via ORAL
  Filled 2017-11-15 (×2): qty 1

## 2017-11-15 MED ORDER — OXYCODONE-ACETAMINOPHEN 5-325 MG PO TABS: 1.0000 | ORAL_TABLET | Freq: Three times a day (TID) | ORAL | 0 refills | Status: DC | PRN

## 2017-11-15 NOTE — ED Notes (Signed)
Bed: [facility] Expected date:  Expected time:  Means of arrival:  Comments: 

## 2017-11-15 NOTE — ED Notes (Signed)
Bed: WA08 Expected date:  Expected time:  Means of arrival:  Comments: EMS-multiple falls

## 2017-11-15 NOTE — ED Notes (Signed)
Patient was provided a ham sandwich and ginger ale with ice. Permission given by Dr. Eugenio Hoes.

## 2017-11-15 NOTE — ED Notes (Signed)
Unsuccessful IV attempt x2.  

## 2017-11-15 NOTE — ED Notes (Signed)
Patient transported to X-ray 

## 2017-11-15 NOTE — Care Management Note (Signed)
Case Management Note  Patient Details  Name: [name] MRN: [id number] Date of Birth: [birth year]  CM consulted for increased needs at home.  CM noted pt was in contact with [facility] palliative team.  CM spoke with [name] who advised pt's first visit will be on 11/18/2017.  CM spoke with pt and spouse at bedside who advised they have used [facility] in the past and would like to use them again.  Discussed HH RN PT OT NA SW and the [facility] vest program.  Pt and spouse are agreeable for the assistance.  CM spoke with [name] with [facility] who accepted pt for services and is aware of pt's needs.  Updated Dr. Wilson Singer.  No further CM needs noted at this time.  Expected Discharge Date:   11/15/2017               Expected Discharge Plan:  [place]  Discharge planning Services  CM Consult  Post Acute Care Choice:  Home Health Choice offered to:  Patient, Spouse  HH Arranged:  RN, PT, OT, Nurse's Aide, Social Work [employer] Agency:  [facility] [facility] [employer]  Status of Service:  Completed, signed off  Bentlee [place], Benjaman Lobe, RN 11/15/2017, 3:45 PM
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
## 2017-11-15 NOTE — ED Triage Notes (Signed)
Pt with recent falls, pt has chronic back pain but states increased pain in lower back. Pt had appt this morning to see [employer] but family sent to ED for eval. Pt reports pain 9/10. Pt with dementia and doesn't recall falling.

## 2017-11-15 NOTE — ED Notes (Signed)
Patients spouse is concerned about discharging patient and taking care of him at home. Informed Dr. Eugenio Hoes, he stated he would come speak with patient and spouse.

## 2017-11-15 NOTE — ED Notes (Signed)
Bed: [facility] Expected date:  Expected time:  Means of arrival:  Comments: Hold for hall b

## 2017-11-15 NOTE — ED Notes (Signed)
[place] RN attempting IV at this time.

## 2017-11-18 ENCOUNTER — Other Ambulatory Visit: Payer: Medicare HMO | Admitting: Internal Medicine

## 2017-11-18 DIAGNOSIS — Z515 Encounter for palliative care: Secondary | ICD-10-CM | POA: Diagnosis not present

## 2017-11-18 DIAGNOSIS — F0390 Unspecified dementia without behavioral disturbance: Secondary | ICD-10-CM | POA: Diagnosis not present

## 2017-11-18 DIAGNOSIS — M179 Osteoarthritis of knee, unspecified: Secondary | ICD-10-CM | POA: Diagnosis not present

## 2017-11-18 DIAGNOSIS — R531 Weakness: Secondary | ICD-10-CM | POA: Diagnosis not present

## 2017-11-18 DIAGNOSIS — J45909 Unspecified asthma, uncomplicated: Secondary | ICD-10-CM | POA: Diagnosis not present

## 2017-11-18 DIAGNOSIS — N400 Enlarged prostate without lower urinary tract symptoms: Secondary | ICD-10-CM | POA: Diagnosis not present

## 2017-11-18 DIAGNOSIS — I509 Heart failure, unspecified: Secondary | ICD-10-CM | POA: Diagnosis not present

## 2017-11-18 DIAGNOSIS — R296 Repeated falls: Secondary | ICD-10-CM

## 2017-11-18 DIAGNOSIS — I10 Essential (primary) hypertension: Secondary | ICD-10-CM | POA: Diagnosis not present

## 2017-11-18 DIAGNOSIS — R413 Other amnesia: Secondary | ICD-10-CM

## 2017-11-18 DIAGNOSIS — N183 Chronic kidney disease, stage 3 (moderate): Secondary | ICD-10-CM | POA: Diagnosis not present

## 2017-11-18 DIAGNOSIS — D649 Anemia, unspecified: Secondary | ICD-10-CM | POA: Diagnosis not present

## 2017-11-18 DIAGNOSIS — E039 Hypothyroidism, unspecified: Secondary | ICD-10-CM | POA: Diagnosis not present

## 2017-11-22 NOTE — Progress Notes (Signed)
PALLIATIVE CARE CONSULT VISIT   PATIENT NAME: [name] DOB: 1940-04-22 MRN: [id number]  PRIMARY CARE PROVIDER:   Lawerance Cruel, MD  REFERRING PROVIDER:  Lawerance Cruel, MD [place], [place] [address]  RESPONSIBLE PARTY:   wife      RECOMMENDATIONS and PLAN:  1.Weakness  R53.1:  Progressive decline over past 1-2 weeks per family.  PT but doubtful that patient would continue recommendations.  Supportive care and continue current diet.  Improve hydration.  Consider adding Cymbalta to improve chronic back pain.  2. Memory Loss R41.3:  FAST stage 7b but is able to speak in brief sentences. Provide total care. Wife is interested in LTC placement. Patient is not able to make best decisions for self related to degree of cognitive decline.   Notify PCP of same.  3. Frequent falls R29.6:  Related to weakness and advancement of cognitive and functional decline.  At risk of major injury explained especially related to long term anticoagulant therapy.  Fall risk precautions. PT evaluation.  4.Palliative care encounter Z51.5:  Lengthy discussion related to Palliative and Hospice care with wife, son, daughter-in-law and patient.  Patient's goals are for improvement of strength and mobility.  Wife and son have similar goals however, wife is interested in placement in a "rehab facility" for a trial of therapy and for possible longterm placement.  She is his primary caregiver and feels that she is unable to safely provide care for him any longer espescially related to recurrent falls and overall decline of health.  ACP and code status also reviewed. Wife reports that a living will was completed in the past with a selection of DNR. MOST form reviewed and completed with selections of: DNAR, DNI, intermediate interventions, antibiotics and IV fluids if indicated.  No tube feedings. Encouraged wife to call EMS if patient falls again, if son is not available to lift him, and  have him transported to the ER for evaluation if recommended by EMS.  Plan on communication with PCP to recommend placement per request of family.   I spent 90 minutes providing this consultation,  from 3:15pm to 4:45pm at home. More than 50% of the time in this consultation was spent coordinating communication with family and patient.  Message left at PCP's office to call this NP in reference to SNF placement.  HISTORY OF PRESENT ILLNESS:  [name] is a 77 y.o. year old male with multiple medical problems including Addison's disease, CHF(EF 50% in March 2019), respiratory failure, stage 3 CKD, peripheral neuropathy, dementia with progressive weakness and recurrent falls.  Wife and and son report that patient has become weaker over the past 2 weeks and falls numerous in a day and week.  He has slept on the floor numerous times due to wife's inability to help him stand.  Pt. Is now bed bound without ability to transfer.  He is incontinent of B&B.  He is able to feed self and nutritional intake has decreased.  Adequate hydration is a challenge. He was evaluated in the ER on 10/22 due to increased back pain after a fall at home and an admission in June 2019 due to cellulitis. Pending appt with [facility] for evaluation on 11/21/17.  Palliative Care was asked to help address goals of care.   CODE STATUS: DNR  PPS: 30% HOSPICE ELIGIBILITY/DIAGNOSIS: TBD  PAST MEDICAL HISTORY:  Past Medical History:  Diagnosis Date  . Acute lower GI bleeding  a. admitted to [facility] 04/11/2013;  b. 04/2014 EGD: 1.  gastric erosions, mild prox gastritis and bulbar duodenitis->Protonix.  . Addison's disease ([place])   . Anemia   . Anemia in CKD (chronic kidney disease) 06/25/2015  . Chronic back pain   . Chronic diastolic CHF (congestive heart failure) ([place])    a. His initial ejection fraction was between 30 and 35%;  b. 04/2013 Echo: EF 50-55% Gr1 DD, mild-mod MR;  c. 04/2014 Echo: EF 60-65%, Gr 1 DD, triv TR.  .  CKD (chronic kidney disease), stage III ([place])   . Coronary artery disease    a. 07/2014 - s/p difficult PCI - had pressure wire analysis of LAD s/p DES in mid segment c/b wire-induced dissection in the distal segment of the LAD which was treated with repeated balloon inflations. Also had DES to rPDA - again had either spasm distal to the stent placement or an edge dissection but the vessel was small in that area; procedure aborted.  . Daily headache   . DVT (deep venous thrombosis) ([place])    a. 02/2013 superficial thrombophlebitis --> Xarelto later d/c'd 2/2 GIB;  b. 04/2014 Acute Right PT, Peroneal, Popliteal, Femoral, and common femoral DVT-->coumadin  . GERD (gastroesophageal reflux disease)   . H/O hiatal hernia   . Hepatitis    a. 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  . History of aortic insufficiency   . Hx of cardiovascular stress test    a. ETT-Myoview 7/14:  Low risk, inferior defect consistent with thinning, no ischemia, normal wall motion, EF 54%  . Hypertension   . Hypogonadism male   . Obesity   . On home oxygen therapy    a. added 04/2014 in setting of PE.  Marland Kitchen Peptic ulcer disease   . Pulmonary embolism ([place])    a. 04/2014 CTA: Bilat submassive PE ->coumadin.  . Pulmonary HTN ([place])   . Thyroid disease     SOCIAL HX:  Social History   Tobacco Use  . Smoking status: Never Smoker  . Smokeless tobacco: Never Used  Substance Use Topics  . Alcohol use: No    Alcohol/week: 0.0 standard drinks    ALLERGIES: No Known Allergies   PERTINENT MEDICATIONS:  Outpatient Encounter Medications as of 11/18/2017  Medication Sig  . blood glucose meter kit and supplies KIT Dispense based on patient and insurance preference. Use up to four times daily as directed. (FOR ICD-9 250.00, 250.01).  . CALCIUM-MAGNESIUM-ZINC PO Take 1 tablet by mouth 2 (two) times daily.  . carvedilol (COREG) 6.25 MG tablet Take 1 tablet (6.25 mg total) by mouth 2 (two) times daily with a meal.  . Cholecalciferol (VITAMIN D-3) 1000  units CAPS Take 1,000 Units by mouth daily.  . clomiPHENE (CLOMID) 50 MG tablet Take 25 mg by mouth daily.  . fludrocortisone (FLORINEF) 0.1 MG tablet Take 1 tablet (0.1 mg total) by mouth daily. (Patient not taking: Reported on 11/15/2017)  . folic acid (FOLVITE) 720 MCG tablet Take 400 mcg by mouth daily.  . furosemide (LASIX) 40 MG tablet Take 0.5 tablets (20 mg total) by mouth daily. (Patient taking differently: Take 20-40 mg by mouth See admin instructions. Take 40 mg by mouth in the morning and 20 mg by mouth in the evening)  . gabapentin (NEURONTIN) 600 MG tablet Take 600 mg by mouth 3 (three) times daily.   . hydrocortisone (CORTEF) 20 MG tablet Take 10-20 mg by mouth See admin instructions. Take 20 mg by mouth in the morning  then 10 mg at noon(time) then 20 mg at bedtime  . Insulin Glargine (LANTUS) 100 UNIT/ML Solostar Pen Inject 20 Units into the skin daily at 10 pm. (Patient taking differently: Inject 28 Units into the skin at bedtime. )  . Insulin Pen Needle 31G X 5 MM MISC Use with Lantus Pen  . isosorbide mononitrate (IMDUR) 30 MG 24 hr tablet Take 0.5 tablets (15 mg total) by mouth daily. (Patient not taking: Reported on 11/15/2017)  . living well with diabetes book MISC 1 book  . megestrol (MEGACE) 40 MG tablet Take 40 mg by mouth daily.  . metFORMIN (GLUCOPHAGE) 500 MG tablet Take 1 tablet (500 mg total) by mouth 2 (two) times daily with a meal. (Patient not taking: Reported on 11/15/2017)  . metoCLOPramide (REGLAN) 5 MG tablet Take 5 mg by mouth daily as needed for nausea or vomiting.   . nitroGLYCERIN (NITROSTAT) 0.4 MG SL tablet PLACE 1 TABLET UNDER TONGUE EVERY 5 MINUTES AS NEEDED FOR CHEST PAIN (UP TO 3 DOSES) (Patient taking differently: Place 0.4 mg under the tongue every 5 (five) minutes as needed for chest pain. Up to 3 doses)  . oxyCODONE-acetaminophen (PERCOCET/ROXICET) 5-325 MG tablet Take 1 tablet by mouth every 8 (eight) hours as needed for severe pain.  . OXYGEN  Inhale 2 L into the lungs continuous.  . potassium chloride (K-DUR,KLOR-CON) 10 MEQ tablet Take 10 mEq by mouth daily.   Marland Kitchen PROAIR HFA 108 (90 Base) MCG/ACT inhaler INHALE 2 PUFFS INTO THE LUNGS EVERY 6 (SIX) HOURS AS NEEDED FOR WHEEZING OR SHORTNESS OF BREATH. (Patient not taking: Reported on 11/15/2017)  . promethazine (PHENERGAN) 25 MG tablet Take 25 mg by mouth every 6 (six) hours as needed for nausea.   Marland Kitchen thyroid (ARMOUR) 60 MG tablet Take 60 mg by mouth daily.   . traMADol (ULTRAM) 50 MG tablet Take 100 mg by mouth 3 (three) times daily.   . traZODone (DESYREL) 100 MG tablet Take 100 mg by mouth at bedtime.   Marland Kitchen UNABLE TO FIND Take 1 tablet by mouth 2 (two) times daily. Life Extension Multivitamins  . vitamin B-12 (CYANOCOBALAMIN) 1000 MCG tablet Take 1,000 mcg by mouth daily.  Marland Kitchen VITAMIN E PO Take 1 capsule by mouth daily.   Marland Kitchen warfarin (COUMADIN) 3 MG tablet TAKE AS DIRECTED BY COUMADIN CLINIC (Patient taking differently: Take 3 mg by mouth See admin instructions. Take 3 mg by mouth daily except take 1.5 mg by mouth on Sunday)   No facility-administered encounter medications on file as of 11/18/2017.     PHYSICAL EXAM:   General: Well nourished elderly male reclining in bed,  In NAD Cardiovascular: regular rate and rhythm Pulmonary: clear ant fields. O2 via [place] in use Abdomen: soft, nontender, + bowel sounds GU: no suprapubic tenderness. Strong odor of urine Extremities: Trace edema of BLE Skin:exposed skin is intact Neurological: Alert and oriented to person and place. Weakness but otherwise nonfocal  Psych:  Calm and cooperative.  Pleasantly confused  Gonzella Lex, NP-C

## 2017-11-28 ENCOUNTER — Observation Stay (HOSPITAL_COMMUNITY)
Admission: EM | Admit: 2017-11-28 | Discharge: 2017-12-01 | Disposition: A | Discharge status: Skilled Nursing Facility | Payer: Medicare HMO | Attending: Family Medicine | Admitting: Family Medicine

## 2017-11-28 ENCOUNTER — Observation Stay (HOSPITAL_COMMUNITY): Payer: Medicare HMO

## 2017-11-28 ENCOUNTER — Emergency Department (HOSPITAL_COMMUNITY): Payer: Medicare HMO

## 2017-11-28 ENCOUNTER — Encounter (HOSPITAL_COMMUNITY): Payer: Self-pay

## 2017-11-28 DIAGNOSIS — M6281 Muscle weakness (generalized): Secondary | ICD-10-CM | POA: Diagnosis not present

## 2017-11-28 DIAGNOSIS — I272 Pulmonary hypertension, unspecified: Secondary | ICD-10-CM | POA: Diagnosis not present

## 2017-11-28 DIAGNOSIS — E876 Hypokalemia: Secondary | ICD-10-CM | POA: Diagnosis not present

## 2017-11-28 DIAGNOSIS — B1920 Unspecified viral hepatitis C without hepatic coma: Secondary | ICD-10-CM | POA: Insufficient documentation

## 2017-11-28 DIAGNOSIS — Z9981 Dependence on supplemental oxygen: Secondary | ICD-10-CM | POA: Diagnosis not present

## 2017-11-28 DIAGNOSIS — E291 Testicular hypofunction: Secondary | ICD-10-CM | POA: Diagnosis not present

## 2017-11-28 DIAGNOSIS — Z955 Presence of coronary angioplasty implant and graft: Secondary | ICD-10-CM | POA: Insufficient documentation

## 2017-11-28 DIAGNOSIS — F0390 Unspecified dementia without behavioral disturbance: Secondary | ICD-10-CM | POA: Diagnosis present

## 2017-11-28 DIAGNOSIS — E1140 Type 2 diabetes mellitus with diabetic neuropathy, unspecified: Secondary | ICD-10-CM | POA: Insufficient documentation

## 2017-11-28 DIAGNOSIS — Z7901 Long term (current) use of anticoagulants: Secondary | ICD-10-CM | POA: Diagnosis not present

## 2017-11-28 DIAGNOSIS — E271 Primary adrenocortical insufficiency: Secondary | ICD-10-CM | POA: Diagnosis not present

## 2017-11-28 DIAGNOSIS — N183 Chronic kidney disease, stage 3 unspecified: Secondary | ICD-10-CM | POA: Diagnosis present

## 2017-11-28 DIAGNOSIS — I201 Angina pectoris with documented spasm: Secondary | ICD-10-CM | POA: Diagnosis not present

## 2017-11-28 DIAGNOSIS — I10 Essential (primary) hypertension: Secondary | ICD-10-CM | POA: Diagnosis not present

## 2017-11-28 DIAGNOSIS — I2510 Atherosclerotic heart disease of native coronary artery without angina pectoris: Secondary | ICD-10-CM | POA: Diagnosis not present

## 2017-11-28 DIAGNOSIS — M545 Low back pain: Secondary | ICD-10-CM | POA: Diagnosis not present

## 2017-11-28 DIAGNOSIS — M546 Pain in thoracic spine: Secondary | ICD-10-CM | POA: Insufficient documentation

## 2017-11-28 DIAGNOSIS — S299XXA Unspecified injury of thorax, initial encounter: Secondary | ICD-10-CM | POA: Diagnosis not present

## 2017-11-28 DIAGNOSIS — R0902 Hypoxemia: Secondary | ICD-10-CM | POA: Diagnosis not present

## 2017-11-28 DIAGNOSIS — I200 Unstable angina: Secondary | ICD-10-CM | POA: Diagnosis not present

## 2017-11-28 DIAGNOSIS — Z794 Long term (current) use of insulin: Secondary | ICD-10-CM | POA: Diagnosis not present

## 2017-11-28 DIAGNOSIS — Z8711 Personal history of peptic ulcer disease: Secondary | ICD-10-CM | POA: Insufficient documentation

## 2017-11-28 DIAGNOSIS — I5032 Chronic diastolic (congestive) heart failure: Secondary | ICD-10-CM | POA: Diagnosis present

## 2017-11-28 DIAGNOSIS — M5124 Other intervertebral disc displacement, thoracic region: Secondary | ICD-10-CM | POA: Diagnosis not present

## 2017-11-28 DIAGNOSIS — I130 Hypertensive heart and chronic kidney disease with heart failure and stage 1 through stage 4 chronic kidney disease, or unspecified chronic kidney disease: Secondary | ICD-10-CM | POA: Insufficient documentation

## 2017-11-28 DIAGNOSIS — Z79899 Other long term (current) drug therapy: Secondary | ICD-10-CM | POA: Insufficient documentation

## 2017-11-28 DIAGNOSIS — S3992XA Unspecified injury of lower back, initial encounter: Secondary | ICD-10-CM | POA: Diagnosis not present

## 2017-11-28 DIAGNOSIS — E039 Hypothyroidism, unspecified: Secondary | ICD-10-CM | POA: Diagnosis present

## 2017-11-28 DIAGNOSIS — M4807 Spinal stenosis, lumbosacral region: Secondary | ICD-10-CM | POA: Diagnosis not present

## 2017-11-28 DIAGNOSIS — E1122 Type 2 diabetes mellitus with diabetic chronic kidney disease: Secondary | ICD-10-CM | POA: Insufficient documentation

## 2017-11-28 DIAGNOSIS — W19XXXA Unspecified fall, initial encounter: Secondary | ICD-10-CM | POA: Diagnosis not present

## 2017-11-28 DIAGNOSIS — E119 Type 2 diabetes mellitus without complications: Secondary | ICD-10-CM

## 2017-11-28 DIAGNOSIS — Z86711 Personal history of pulmonary embolism: Secondary | ICD-10-CM | POA: Diagnosis not present

## 2017-11-28 DIAGNOSIS — M5127 Other intervertebral disc displacement, lumbosacral region: Secondary | ICD-10-CM | POA: Diagnosis not present

## 2017-11-28 DIAGNOSIS — R531 Weakness: Secondary | ICD-10-CM | POA: Diagnosis not present

## 2017-11-28 DIAGNOSIS — K219 Gastro-esophageal reflux disease without esophagitis: Secondary | ICD-10-CM | POA: Insufficient documentation

## 2017-11-28 DIAGNOSIS — Z9889 Other specified postprocedural states: Secondary | ICD-10-CM | POA: Insufficient documentation

## 2017-11-28 DIAGNOSIS — E669 Obesity, unspecified: Secondary | ICD-10-CM | POA: Diagnosis not present

## 2017-11-28 DIAGNOSIS — G8929 Other chronic pain: Secondary | ICD-10-CM | POA: Insufficient documentation

## 2017-11-28 DIAGNOSIS — M25552 Pain in left hip: Secondary | ICD-10-CM | POA: Diagnosis not present

## 2017-11-28 DIAGNOSIS — D631 Anemia in chronic kidney disease: Secondary | ICD-10-CM | POA: Insufficient documentation

## 2017-11-28 DIAGNOSIS — D510 Vitamin B12 deficiency anemia due to intrinsic factor deficiency: Secondary | ICD-10-CM | POA: Diagnosis present

## 2017-11-28 DIAGNOSIS — Z9049 Acquired absence of other specified parts of digestive tract: Secondary | ICD-10-CM | POA: Insufficient documentation

## 2017-11-28 DIAGNOSIS — S79912A Unspecified injury of left hip, initial encounter: Secondary | ICD-10-CM | POA: Diagnosis not present

## 2017-11-28 DIAGNOSIS — M549 Dorsalgia, unspecified: Secondary | ICD-10-CM | POA: Diagnosis present

## 2017-11-28 DIAGNOSIS — M4804 Spinal stenosis, thoracic region: Secondary | ICD-10-CM | POA: Diagnosis not present

## 2017-11-28 DIAGNOSIS — Z832 Family history of diseases of the blood and blood-forming organs and certain disorders involving the immune mechanism: Secondary | ICD-10-CM | POA: Insufficient documentation

## 2017-11-28 LAB — CBC WITH DIFFERENTIAL/PLATELET
Abs Immature Granulocytes: 0.06 10*3/uL (ref 0.00–0.07)
BASOS PCT: 1 %
Basophils Absolute: 0.1 10*3/uL (ref 0.0–0.1)
Eosinophils Absolute: 0.4 10*3/uL (ref 0.0–0.5)
Eosinophils Relative: 3 %
HEMATOCRIT: 42.9 % (ref 39.0–52.0)
Hemoglobin: 13.2 g/dL (ref 13.0–17.0)
Immature Granulocytes: 0 %
LYMPHS ABS: 3.4 10*3/uL (ref 0.7–4.0)
Lymphocytes Relative: 22 %
MCH: 24.4 pg — AB (ref 26.0–34.0)
MCHC: 30.8 g/dL (ref 30.0–36.0)
MCV: 79.4 fL — AB (ref 80.0–100.0)
MONO ABS: 1.5 10*3/uL — AB (ref 0.1–1.0)
MONOS PCT: 10 %
Neutro Abs: 9.7 10*3/uL — ABNORMAL HIGH (ref 1.7–7.7)
Neutrophils Relative %: 64 %
PLATELETS: 369 10*3/uL (ref 150–400)
RBC: 5.4 MIL/uL (ref 4.22–5.81)
RDW: 17 % — ABNORMAL HIGH (ref 11.5–15.5)
WBC: 15.1 10*3/uL — ABNORMAL HIGH (ref 4.0–10.5)
nRBC: 0 % (ref 0.0–0.2)

## 2017-11-28 LAB — HEMOGLOBIN A1C
Hgb A1c MFr Bld: 7.5 % — ABNORMAL HIGH (ref 4.8–5.6)
MEAN PLASMA GLUCOSE: 168.55 mg/dL

## 2017-11-28 LAB — BASIC METABOLIC PANEL
Anion gap: 11 (ref 5–15)
BUN: 22 mg/dL (ref 8–23)
CALCIUM: 8.9 mg/dL (ref 8.9–10.3)
CO2: 27 mmol/L (ref 22–32)
Chloride: 102 mmol/L (ref 98–111)
Creatinine, Ser: 1.79 mg/dL — ABNORMAL HIGH (ref 0.61–1.24)
GFR calc Af Amer: 41 mL/min — ABNORMAL LOW (ref >60)
GFR, EST NON AFRICAN AMERICAN: 35 mL/min — AB (ref >60)
GLUCOSE: 115 mg/dL — AB (ref 70–99)
Potassium: 2.6 mmol/L — CL (ref 3.5–5.1)
Sodium: 140 mmol/L (ref 135–145)

## 2017-11-28 LAB — PROTIME-INR
INR: 2.55
PROTHROMBIN TIME: 27 s — AB (ref 11.4–15.2)

## 2017-11-28 LAB — GLUCOSE, CAPILLARY: Glucose-Capillary: 114 mg/dL — ABNORMAL HIGH (ref 70–99)

## 2017-11-28 MED ORDER — WARFARIN SODIUM 3 MG PO TABS
3.0000 mg | ORAL_TABLET | Freq: Once | ORAL | Status: AC
  Administered 2017-11-28: 3 mg via ORAL
  Filled 2017-11-28: qty 1

## 2017-11-28 MED ORDER — HYDROCORTISONE 10 MG PO TABS
10.0000 mg | ORAL_TABLET | ORAL | Status: DC
  Administered 2017-11-29 – 2017-12-01 (×3): 10 mg via ORAL
  Filled 2017-11-28 (×3): qty 1

## 2017-11-28 MED ORDER — ACETAMINOPHEN 650 MG RE SUPP: 650.0000 mg | Freq: Four times a day (QID) | RECTAL | Status: DC | PRN

## 2017-11-28 MED ORDER — TRAZODONE HCL 100 MG PO TABS
100.0000 mg | ORAL_TABLET | Freq: Every day | ORAL | Status: DC
  Administered 2017-11-29 – 2017-11-30 (×3): 100 mg via ORAL
  Filled 2017-11-28 (×3): qty 1

## 2017-11-28 MED ORDER — VITAMIN B-12 1000 MCG PO TABS
1000.0000 ug | ORAL_TABLET | Freq: Every day | ORAL | Status: DC
  Administered 2017-11-29 – 2017-12-01 (×3): 1000 ug via ORAL
  Filled 2017-11-28 (×3): qty 1

## 2017-11-28 MED ORDER — POTASSIUM CHLORIDE 20 MEQ/15ML (10%) PO SOLN
40.0000 meq | Freq: Once | ORAL | Status: AC
  Administered 2017-11-28: 40 meq via ORAL
  Filled 2017-11-28: qty 30

## 2017-11-28 MED ORDER — POTASSIUM CHLORIDE CRYS ER 20 MEQ PO TBCR: 40.0000 meq | EXTENDED_RELEASE_TABLET | Freq: Once | ORAL | Status: DC

## 2017-11-28 MED ORDER — POTASSIUM CHLORIDE 10 MEQ/100ML IV SOLN
10.0000 meq | INTRAVENOUS | Status: AC
  Administered 2017-11-29 (×5): 10 meq via INTRAVENOUS
  Filled 2017-11-28 (×5): qty 100

## 2017-11-28 MED ORDER — ONDANSETRON HCL 4 MG PO TABS: 4.0000 mg | ORAL_TABLET | Freq: Four times a day (QID) | ORAL | Status: DC | PRN

## 2017-11-28 MED ORDER — THYROID 60 MG PO TABS
60.0000 mg | ORAL_TABLET | Freq: Every day | ORAL | Status: DC
  Administered 2017-11-28 – 2017-12-01 (×4): 60 mg via ORAL
  Filled 2017-11-28 (×4): qty 1

## 2017-11-28 MED ORDER — ONDANSETRON HCL 4 MG/2ML IJ SOLN
4.0000 mg | Freq: Once | INTRAMUSCULAR | Status: AC
  Administered 2017-11-28: 4 mg via INTRAVENOUS
  Filled 2017-11-28: qty 2

## 2017-11-28 MED ORDER — DOCUSATE SODIUM 100 MG PO CAPS
100.0000 mg | ORAL_CAPSULE | Freq: Two times a day (BID) | ORAL | Status: DC
  Administered 2017-11-29 – 2017-12-01 (×5): 100 mg via ORAL
  Filled 2017-11-28 (×5): qty 1

## 2017-11-28 MED ORDER — POTASSIUM CHLORIDE 10 MEQ/100ML IV SOLN
10.0000 meq | Freq: Once | INTRAVENOUS | Status: AC
  Administered 2017-11-28: 10 meq via INTRAVENOUS
  Filled 2017-11-28: qty 100

## 2017-11-28 MED ORDER — POTASSIUM CHLORIDE 10 MEQ/100ML IV SOLN: 10.0000 meq | INTRAVENOUS | Status: DC

## 2017-11-28 MED ORDER — ONDANSETRON HCL 4 MG/2ML IJ SOLN: 4.0000 mg | Freq: Four times a day (QID) | INTRAMUSCULAR | Status: DC | PRN

## 2017-11-28 MED ORDER — MEGESTROL ACETATE 40 MG PO TABS
40.0000 mg | ORAL_TABLET | Freq: Every day | ORAL | Status: DC
  Administered 2017-11-29 – 2017-12-01 (×3): 40 mg via ORAL
  Filled 2017-11-28 (×3): qty 1

## 2017-11-28 MED ORDER — INSULIN ASPART 100 UNIT/ML ~~LOC~~ SOLN
0.0000 [IU] | Freq: Three times a day (TID) | SUBCUTANEOUS | Status: DC
  Administered 2017-11-29: 2 [IU] via SUBCUTANEOUS
  Administered 2017-11-29: 3 [IU] via SUBCUTANEOUS
  Administered 2017-11-30: 2 [IU] via SUBCUTANEOUS

## 2017-11-28 MED ORDER — ACETAMINOPHEN 325 MG PO TABS
650.0000 mg | ORAL_TABLET | Freq: Four times a day (QID) | ORAL | Status: DC | PRN
  Administered 2017-11-29 – 2017-11-30 (×3): 650 mg via ORAL
  Filled 2017-11-28 (×3): qty 2

## 2017-11-28 MED ORDER — LIDOCAINE 5 % EX PTCH
1.0000 | MEDICATED_PATCH | CUTANEOUS | Status: DC
  Administered 2017-11-29 – 2017-12-01 (×2): 1 via TRANSDERMAL
  Filled 2017-11-28 (×3): qty 1

## 2017-11-28 MED ORDER — CARVEDILOL 6.25 MG PO TABS
6.2500 mg | ORAL_TABLET | Freq: Two times a day (BID) | ORAL | Status: DC
  Administered 2017-11-29 – 2017-12-01 (×6): 6.25 mg via ORAL
  Filled 2017-11-28 (×6): qty 1

## 2017-11-28 MED ORDER — MORPHINE SULFATE (PF) 4 MG/ML IV SOLN
4.0000 mg | Freq: Once | INTRAVENOUS | Status: AC
  Administered 2017-11-28: 4 mg via INTRAVENOUS
  Filled 2017-11-28: qty 1

## 2017-11-28 MED ORDER — FOLIC ACID 1 MG PO TABS
500.0000 ug | ORAL_TABLET | Freq: Every day | ORAL | Status: DC
  Administered 2017-11-29 – 2017-12-01 (×3): 0.5 mg via ORAL
  Filled 2017-11-28 (×3): qty 1

## 2017-11-28 MED ORDER — WARFARIN - PHARMACIST DOSING INPATIENT: Freq: Every day | Status: DC

## 2017-11-28 MED ORDER — INSULIN GLARGINE 100 UNIT/ML ~~LOC~~ SOLN
26.0000 [IU] | Freq: Every day | SUBCUTANEOUS | Status: DC
  Administered 2017-11-28 – 2017-11-30 (×3): 26 [IU] via SUBCUTANEOUS
  Filled 2017-11-28 (×4): qty 0.26

## 2017-11-28 MED ORDER — HYDROCORTISONE 10 MG PO TABS
20.0000 mg | ORAL_TABLET | Freq: Two times a day (BID) | ORAL | Status: DC
  Administered 2017-11-28 – 2017-12-01 (×6): 20 mg via ORAL
  Filled 2017-11-28 (×7): qty 2

## 2017-11-28 NOTE — H&P (Signed)
History and Physical    [name] DGU:[id number] DOB: 30-Jul-1940 DOA: 11/28/2017  ZDG:LOVF, Dwyane Luo, MD Consultants: None Patient coming from:  Home - lives with wife; NOK: Wife  Chief Complaint: back pain  IEP:PIRJJO [name] a 77 y.o.malewith medical history significant ofhypothyroidism; submassive PE (2016); HTN; CAD s/p stent; stage 3 CKD; dementia; and Addison's disease presenting with back pain. "I been having some issues.  I fell, actually.  I just kind of crumbled like a wet noodle and hit the floor."  He is having pain in his low back, but this is not new.  Denies LE symptoms.  Denies incontinence.  Currently reports chronic back pain and "tired of lying in the dang pain."   ED Course:  K+ 2.6, overall weakness.  Deconditioning over a long period of time with LE weakness.  Also with urinary/fecal incontinence.  Wife is unable to take to the doctor.  SW/CM are working on getting rehab after d/c.    Review of Systems: As per HPI; otherwise review of systems reviewed and negative.  This is suspect given his baseline dementia - although A&O x 3 at time of admission.  Ambulatory Status:  Ambulates without assistance  Past Medical History:  Diagnosis Date  . Acute lower GI bleeding    a. admitted to [facility] 04/11/2013;  b. 04/2014 EGD: 1.  gastric erosions, mild prox gastritis and bulbar duodenitis->Protonix.  . Addison's disease ([place])   . Anemia   . Anemia in CKD (chronic kidney disease) 06/25/2015  . Chronic back pain   . Chronic diastolic CHF (congestive heart failure) ([place])    a. His initial ejection fraction was between 30 and 35%;  b. 04/2013 Echo: EF 50-55% Gr1 DD, mild-mod MR;  c. 04/2014 Echo: EF 60-65%, Gr 1 DD, triv TR.  . CKD (chronic kidney disease), stage III ([place])   . Coronary artery disease    a. 07/2014 - s/p difficult PCI - had pressure wire analysis of LAD s/p DES in mid segment c/b wire-induced dissection in the distal segment of the LAD which  was treated with repeated balloon inflations. Also had DES to rPDA - again had either spasm distal to the stent placement or an edge dissection but the vessel was small in that area; procedure aborted.  . Daily headache   . GERD (gastroesophageal reflux disease)   . H/O hiatal hernia   . Hepatitis    a. 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  . History of aortic insufficiency   . Hx of cardiovascular stress test    a. ETT-Myoview 7/14:  Low risk, inferior defect consistent with thinning, no ischemia, normal wall motion, EF 54%  . Hypertension   . Hypogonadism male   . Obesity   . Peptic ulcer disease   . Pulmonary embolism ([place])    a. 04/2014 CTA: Bilat submassive PE ->coumadin.  . Pulmonary HTN ([place])   . Thyroid disease     Past Surgical History:  Procedure Laterality Date  . APPENDECTOMY  1950's  . CARDIAC CATHETERIZATION  06/26/2008   EF 30-35%  . CARDIAC CATHETERIZATION N/A 08/20/2014   Procedure: Right/Left Heart Cath and Coronary Angiography;  Surgeon: Jolaine Artist, MD;  Location: [place] CV LAB;  Service: Cardiovascular;  Laterality: N/A;  . CARDIAC CATHETERIZATION N/A 08/21/2014   Procedure: Coronary Stent Intervention;  Surgeon: Wellington Hampshire, MD;  Location: [place] CV LAB;  Service: Cardiovascular;  Laterality: N/A;  . CARDIAC CATHETERIZATION N/A 11/24/2015   Procedure:  Right/Left Heart Cath and Coronary Angiography;  Surgeon: Jolaine Artist, MD;  Location: [place] CV LAB;  Service: Cardiovascular;  Laterality: N/A;  . CHOLECYSTECTOMY  ~ 1965  . ESOPHAGOGASTRODUODENOSCOPY N/A 04/11/2013   Procedure: ESOPHAGOGASTRODUODENOSCOPY (EGD);  Surgeon: Missy Sabins, MD;  Location: [facility] ENDOSCOPY;  Service: Endoscopy;  Laterality: N/A;  . ESOPHAGOGASTRODUODENOSCOPY N/A 05/13/2014   Procedure: ESOPHAGOGASTRODUODENOSCOPY (EGD);  Surgeon: Arta Silence, MD;  Location: [facility] ENDOSCOPY;  Service: Endoscopy;  Laterality: N/A;  . TONSILLECTOMY  1940's  . US ECHOCARDIOGRAPHY  04/09/2010   EF 60-65%     Social History   Socioeconomic History  . Marital status: Married    Spouse name: Not on file  . Number of children: Not on file  . Years of education: Not on file  . Highest education level: Not on file  Occupational History  . Occupation: retired  Scientific laboratory technician  . Financial resource strain: Not on file  . Food insecurity:    Worry: Not on file    Inability: Not on file  . Transportation needs:    Medical: Not on file    Non-medical: Not on file  Tobacco Use  . Smoking status: Never Smoker  . Smokeless tobacco: Never Used  Substance and Sexual Activity  . Alcohol use: No    Alcohol/week: 0.0 standard drinks  . Drug use: No  . Sexual activity: Not on file  Lifestyle  . Physical activity:    Days per week: Not on file    Minutes per session: Not on file  . Stress: Not on file  Relationships  . Social connections:    Talks on phone: Not on file    Gets together: Not on file    Attends religious service: Not on file    Active member of club or organization: Not on file    Attends meetings of clubs or organizations: Not on file    Relationship status: Not on file  . Intimate partner violence:    Fear of current or ex partner: Not on file    Emotionally abused: Not on file    Physically abused: Not on file    Forced sexual activity: Not on file  Other Topics Concern  . Not on file  Social History Narrative  . Not on file    No Known Allergies  Family History  Problem Relation Age of Onset  . Clotting disorder Mother   . Clotting disorder Brother   . Clotting disorder Brother   . Clotting disorder Brother   . Clotting disorder Other        Niece.  Marland Kitchen Heart attack Neg Hx     Prior to Admission medications   Medication Sig Start Date End Date Taking? Authorizing Provider  CALCIUM-MAGNESIUM-ZINC PO Take 1 tablet by mouth 2 (two) times daily.   Yes [provider]  carvedilol (COREG) 6.25 MG tablet Take 1 tablet (6.25 mg total) by mouth 2 (two)  times daily with a meal. 01/26/17  Yes Larey Dresser, MD  Cholecalciferol (VITAMIN D-3) 1000 units CAPS Take 1,000 Units by mouth daily.   Yes [provider]  clomiPHENE (CLOMID) 50 MG tablet Take 25 mg by mouth daily. 10/05/17  Yes [provider]  folic acid (FOLVITE) 468 MCG tablet Take 400 mcg by mouth daily.   Yes [provider]  furosemide (LASIX) 40 MG tablet Take 0.5 tablets (20 mg total) by mouth daily. Patient taking differently: Take 40 mg by mouth daily.  07/11/17  Yes Gherghe, Vella Redhead, MD  gabapentin (NEURONTIN) 600 MG tablet Take 600 mg by mouth 3 (three) times daily as needed.    Yes [provider]  hydrocortisone (CORTEF) 20 MG tablet Take 10-20 mg by mouth See admin instructions. Take 20 mg by mouth in the morning then 10 mg at noon(time) then 20 mg at bedtime   Yes [provider]  Insulin Glargine (LANTUS) 100 UNIT/ML Solostar Pen Inject 20 Units into the skin daily at 10 pm. Patient taking differently: Inject 26 Units into the skin at bedtime.  04/25/17  Yes Elwyn Reach, MD  megestrol (MEGACE) 40 MG tablet Take 40 mg by mouth daily. 10/28/17  Yes [provider]  OXYGEN Inhale 2 L into the lungs continuous.   Yes [provider]  potassium chloride (K-DUR,KLOR-CON) 10 MEQ tablet Take 10 mEq by mouth daily.    Yes [provider]  promethazine (PHENERGAN) 25 MG tablet Take 25 mg by mouth every 6 (six) hours as needed for nausea.  05/17/14  Yes [provider]  thyroid (ARMOUR) 60 MG tablet Take 60 mg by mouth daily.    Yes [provider]  traZODone (DESYREL) 100 MG tablet Take 100 mg by mouth at bedtime.  04/16/14  Yes [provider]  UNABLE TO FIND Take 1 tablet by mouth 2 (two) times daily. Life Extension Multivitamins   Yes [provider]  vitamin B-12 (CYANOCOBALAMIN) 1000 MCG tablet Take 1,000 mcg by mouth daily.   Yes [provider]  VITAMIN E PO Take  1 capsule by mouth daily.    Yes [provider]  warfarin (COUMADIN) 3 MG tablet TAKE AS DIRECTED BY COUMADIN CLINIC Patient taking differently: Take 3 mg by mouth See admin instructions. Take 3 mg by mouth daily except take 1.5 mg by mouth on Sunday 11/04/17  Yes [name], Wonda Cheng, MD  blood glucose meter kit and supplies KIT Dispense based on patient and insurance preference. Use up to four times daily as directed. (FOR ICD-9 250.00, 250.01). 04/25/17   Elwyn Reach, MD  fludrocortisone (FLORINEF) 0.1 MG tablet Take 1 tablet (0.1 mg total) by mouth daily. Patient not taking: Reported on 11/15/2017 07/12/17   Caren Griffins, MD  Insulin Pen Needle 31G X 5 MM MISC Use with Lantus Pen 04/25/17   Elwyn Reach, MD  isosorbide mononitrate (IMDUR) 30 MG 24 hr tablet Take 0.5 tablets (15 mg total) by mouth daily. Patient not taking: Reported on 11/15/2017 09/05/14   Richardson Dopp T, PA-C  living well with diabetes book MISC 1 book 04/25/17   Elwyn Reach, MD  metFORMIN (GLUCOPHAGE) 500 MG tablet Take 1 tablet (500 mg total) by mouth 2 (two) times daily with a meal. Patient not taking: Reported on 11/15/2017 04/25/17 04/25/18  Elwyn Reach, MD  nitroGLYCERIN (NITROSTAT) 0.4 MG SL tablet PLACE 1 TABLET UNDER TONGUE EVERY 5 MINUTES AS NEEDED FOR CHEST PAIN (UP TO 3 DOSES) Patient taking differently: Place 0.4 mg under the tongue every 5 (five) minutes as needed for chest pain. Up to 3 doses 03/22/16   [name], Wonda Cheng, MD  oxyCODONE-acetaminophen (PERCOCET/ROXICET) 5-325 MG tablet Take 1 tablet by mouth every 8 (eight) hours as needed for severe pain. Patient not taking: Reported on 11/28/2017 11/15/17   Virgel Manifold, MD  PROAIR HFA 108 [phone] Base) MCG/ACT inhaler INHALE 2 PUFFS INTO THE LUNGS EVERY 6 (SIX) HOURS AS NEEDED FOR WHEEZING OR SHORTNESS OF  BREATH. Patient not taking: Reported on 11/15/2017 07/08/15   Collene Gobble, MD    Physical Exam: Vitals:   11/28/17 1118 11/28/17  1554 11/28/17 1630 11/28/17 1700  BP: (!) 160/77  121/69 (!) 156/79  Pulse: 86 77    Resp: 18 15 12 13   Temp: 98.3 F (36.8 C)     TempSrc: Oral     SpO2: 91% 99%  100%     General:  Appears calm and comfortable and is NAD Eyes:  PERRL, EOMI, normal lids, iris ENT:  grossly normal hearing, lips & tongue, mmm; artificial dentition Neck:  no LAD, masses or thyromegaly; no carotid bruits Cardiovascular:  RRR, no m/Dennis/g. No LE edema.  Respiratory:   CTA bilaterally with no wheezes/rales/rhonchi.  Normal respiratory effort. Abdomen:  soft, NT, ND, NABS Back:   normal alignment, no CVAT Skin:  no rash or induration seen on limited exam Musculoskeletal:  grossly normal tone BUE/BLE, good ROM, no bony abnormality Psychiatric:  grossly normal mood and affect, speech fluent and appropriate, AOx3 Neurologic:  CN 2-12 grossly intact, moves all extremities in coordinated fashion, sensation intact    Radiological Exams on Admission: Dg Thoracic Spine 2 View  Result Date: 11/28/2017 CLINICAL DATA:  Acute mid back pain following fall today. Initial encounter. EXAM: THORACIC SPINE 2 VIEWS COMPARISON:  07/07/2017 chest radiograph and prior studies FINDINGS: No acute fracture or subluxation identified. Mild multilevel degenerative disc disease noted. Probable DISH changes noted. No suspicious focal bony lesions are present. IMPRESSION: No acute abnormality.  Mild degenerative disc disease/spondylosis. Electronically Signed   By: Margarette Canada M.D.   On: 11/28/2017 13:41   Dg Lumbar Spine Complete  Result Date: 11/28/2017 CLINICAL DATA:  Status post fall today. Mid and low back pain and left hip pain. EXAM: LUMBAR SPINE - COMPLETE 4+ VIEW COMPARISON:  Thoracic spine series of today's date and lumbar spine series of November 15, 2017 FINDINGS: The twelfth ribs are hypoplastic. The lumbar vertebral bodies are preserved in height. There is multilevel disc space narrowing greatest at L3-4 and L5-S1. There is no  spondylolisthesis. There are anterior and lateral bridging osteophytes at multiple levels. The pedicles and transverse processes are intact. There is chronic reversal of the normal lumbar lordosis. IMPRESSION: Multilevel degenerative disc and facet joint changes. No compression fracture, spondylolisthesis, nor other acute bony abnormality of the lumbar spine. Electronically Signed   By: David  Martinique M.D.   On: 11/28/2017 13:37   Dg Hip Unilat W Or Wo Pelvis 2-3 Views Left  Result Date: 11/28/2017 CLINICAL DATA:  Acute LEFT hip pain following fall today. Initial encounter. EXAM: DG HIP (WITH OR WITHOUT PELVIS) 2-3V LEFT COMPARISON:  03/20/2012 abdomen/pelvis CT. FINDINGS: No acute fracture, subluxation or dislocation noted. Mild degenerative changes in both hips noted. Mild to moderate degenerative changes in the LOWER lumbar spine again identified. No suspicious focal bony lesions noted. IMPRESSION: 1. No acute abnormality 2. 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 Electronically Signed   By: Margarette Canada M.D.   On: 11/28/2017 13:39    EKG: pending   Labs on Admission: I have personally reviewed the available labs and imaging studies at the time of the admission.  Pertinent labs:   K+ 2.6 Glucose 115 BUN 22/Creatinine 1.79/GFR 35 - at/near baseline WBC 15.1   Assessment/Plan Principal Problem:   Back pain Active Problems:   Hypothyroidism   Hypogonadism male   CKD (chronic kidney disease) stage 3, GFR 30-59 ml/min (HCC)  Addison disease ([place])   Benign essential HTN   Chronic diastolic CHF (congestive heart failure) (HCC)   Diabetes mellitus without complication (HCC)   Dementia (HCC)   Hypokalemia   Back pain -Patient with chronic back pain and progressive associated weakness -He reports no significant recent changes including LE weakness/incontinence -His wife (who was not present at the time of my evaluation) reportedly voiced concerns for these issues -MRI  thoracic and lumbar spine ordered by ER provider -PT/OT consults also requested in ER -CM/SW also requested in ER -Will observe overnight to ensure that MRI and consults can take place but he does not appear to qualify for inpatient status at this time -He does appear likely to need placement  Hypokalemia -K+ 2.6 -Patient unable to swallow large pills, will order PO liquid supplementation as well as IV supplementation x 5 runs -Will check Mag and repeat BMP in AM -Hold Lasix  Dementia -It appears that his wife is unable to care for him at home due his progressive dementia  Hypothyroidism -Recent TSH (6/19) WNL -Continue Armour thyroid at current dose for now  Hypogonadism -Hold Clomid -Continue Megace  CKD stage 3 -Appears to be stable at this time, will follow  Addison's -Continue hydrocortisone -He does not appear to need stress dosed steroids at this time  HTN -Continue Coreg  Chronic diastolic CHF -Echo in 8/86 showed EF 50-55% with grade 1 diastolic dysfunction -Appears to be compensated at this time -Hold Lasix, as noted above  H/o recurrent VTE -Continue Coumadin - pharmacy to dose -He was subtherapeutic on last check and is a fall risk so this medication is somewhat concerning   DM -Will check A1c -Continue Lantus -Cover with moderate-scale SSI  DVT prophylaxis: Coumadin Code Status:  Full - as per prior admissions Family Communication: None present Disposition Plan:   Likely to need placement once clinically improved due to wife's inability to care for him at home Consults called: PT/OT/CM/SW Admission status: It is my clinical opinion that referral for OBSERVATION is reasonable and necessary in this patient based on the above information provided. The aforementioned taken together are felt to place the patient at high risk for further clinical deterioration. However it is anticipated that the patient may be medically stable for discharge from the hospital  within 24 to 48 hours.    Karmen Bongo MD [facility]  If note is complete, please contact covering daytime or nighttime physician. [email or web address] Password [facility]  11/28/2017, 5:37 PM

## 2017-11-28 NOTE — Progress Notes (Addendum)
ANTICOAGULATION CONSULT NOTE - Initial Consult  Pharmacy Consult for Warfarin Indication: Hx of VTE  No Known Allergies   Vital Signs: Temp: 98.3 F (36.8 C) (11/04 1118) Temp Source: Oral (11/04 1118) BP: 156/79 (11/04 1700) Pulse Rate: 77 (11/04 1554)  Labs: Recent Labs    11/28/17 1355  HGB 13.2  HCT 42.9  PLT 369  CREATININE 1.79*    CrCl cannot be calculated (Unknown ideal weight.).   Medical History: Past Medical History:  Diagnosis Date  . Acute lower GI bleeding    a. admitted to [facility] 04/11/2013;  b. 04/2014 EGD: 1.  gastric erosions, mild prox gastritis and bulbar duodenitis->Protonix.  . Addison's disease ([place])   . Anemia   . Anemia in CKD (chronic kidney disease) 06/25/2015  . Chronic back pain   . Chronic diastolic CHF (congestive heart failure) ([place])    a. His initial ejection fraction was between 30 and 35%;  b. 04/2013 Echo: EF 50-55% Gr1 DD, mild-mod MR;  c. 04/2014 Echo: EF 60-65%, Gr 1 DD, triv TR.  . CKD (chronic kidney disease), stage III ([place])   . Coronary artery disease    a. 07/2014 - s/p difficult PCI - had pressure wire analysis of LAD s/p DES in mid segment c/b wire-induced dissection in the distal segment of the LAD which was treated with repeated balloon inflations. Also had DES to rPDA - again had either spasm distal to the stent placement or an edge dissection but the vessel was small in that area; procedure aborted.  . Daily headache   . GERD (gastroesophageal reflux disease)   . H/O hiatal hernia   . Hepatitis    a. 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  . History of aortic insufficiency   . Hx of cardiovascular stress test    a. ETT-Myoview 7/14:  Low risk, inferior defect consistent with thinning, no ischemia, normal wall motion, EF 54%  . Hypertension   . Hypogonadism male   . Obesity   . Peptic ulcer disease   . Pulmonary embolism ([place])    a. 04/2014 CTA: Bilat submassive PE ->coumadin.  . Pulmonary HTN ([place])   . Thyroid disease      Assessment: Patient is a 8 yoM with a history of recurrent VTE on warfarin PTA taking 3 mg daily except 1.5 mg on Sundays. Pharmacy has been consulted for warfarin dosing.   INR therapeutic at 2.55. Patient denies any missed doses.    Goal of Therapy:  INR 2-3   Plan:  Warfarin 3 mg PO x1 this evening Monitor daily INR and s/sx of bleeding    Willia Craze, Pharmacy Student

## 2017-11-28 NOTE — ED Notes (Signed)
Patient transported to X-ray 

## 2017-11-28 NOTE — Evaluation (Signed)
Physical Therapy Evaluation Patient Details Name: [name] MRN: [id number] DOB: Jun 08, 1940 Today's Date: 11/28/2017   History of Present Illness  Pt is a 77 y/o male admitted secondary to worsening back pain and generalized weakness. Lumbar spine imaging revealed degenerative changes. Pt also found to have hypokalemia. PMH includes dementia.   Clinical Impression  Pt admitted secondary to problem above with deficits below. Pt limited by pain and very anxious to move. Pt requiring mod to max A to sit at EOB and only able to tolerate sitting for a few seconds. Educated about back precautions to help with decreasing back pain. Per notes, wife unable to physically assist pt. Feel pt will require SNF level therapies at d/c to increase independence and safety with mobility. Will continue to follow acutely to maximize functional mobility independence and safety.     Follow Up Recommendations SNF;Supervision/Assistance - 24 hour    Equipment Recommendations  None recommended by PT    Recommendations for Other Services       Precautions / Restrictions Precautions Precautions: Fall;Back Precaution Booklet Issued: No Precaution Comments: Pt reports falls at home. Reviewed back precautions to help with pain management.  Restrictions Weight Bearing Restrictions: No      Mobility  Bed Mobility Overal bed mobility: Needs Assistance Bed Mobility: Rolling;Sidelying to Sit;Sit to Sidelying Rolling: Mod assist Sidelying to sit: Max assist     Sit to sidelying: Max assist General bed mobility comments: Mod A to roll to side. Max A to come to sitting and return to sidelying. Cues for log roll technique. Pt only able to tolerate a few seconds in sitting secondary to pain.   Transfers                    Ambulation/Gait                Stairs            Wheelchair Mobility    Modified Rankin (Stroke Patients Only)       Balance Overall balance assessment:  Needs assistance Sitting-balance support: Feet supported;Single extremity supported Sitting balance-Leahy Scale: Poor Sitting balance - Comments: Pt reliant on at least mod A and LUE support to maintain balance.                                      Pertinent Vitals/Pain Pain Assessment: 0-10 Pain Score: 10-Worst pain ever Pain Location: back  Pain Descriptors / Indicators: Guarding;Grimacing Pain Intervention(s): Limited activity within patient's tolerance;Monitored during session;Repositioned    Home Living Family/patient expects to be discharged to:: Private residence Living Arrangements: Spouse/significant other Available Help at Discharge: Family;Available 24 hours/day Type of Home: House Home Access: Stairs to enter   [employer] of Steps: 2 Home Layout: One level Home Equipment: Walker - 2 wheels;Shower seat Additional Comments: History obtained from previous admission as pt with dementia and reporting differing things. Per notes, wife unable to physically assist. Wife not present during session.     Prior Function Level of Independence: Independent with assistive device(s)         Comments: Ambulates with use of RW      Hand Dominance   Dominant Hand: Right    Extremity/Trunk Assessment   Upper Extremity Assessment Upper Extremity Assessment: Defer to OT evaluation    Lower Extremity Assessment Lower Extremity Assessment: Generalized weakness    Cervical / Trunk Assessment  Cervical / Trunk Assessment: Other exceptions Cervical / Trunk Exceptions: back pain  Communication   Communication: No difficulties  Cognition Arousal/Alertness: Awake/alert Behavior During Therapy: Anxious Overall Cognitive Status: No family/caregiver present to determine baseline cognitive functioning                                 General Comments: Pt anxious about mobility tasks and very guarded throughout. Pt with history of dementia,  however, no family present.       General Comments General comments (skin integrity, edema, etc.): No family present.     Exercises     Assessment/Plan    PT Assessment Patient needs continued PT services  PT Problem List Decreased strength;Decreased activity tolerance;Decreased balance;Decreased mobility;Decreased knowledge of use of DME;Decreased knowledge of precautions       PT Treatment Interventions DME instruction;Gait training;Functional mobility training;Therapeutic activities;Therapeutic exercise;Balance training;Patient/family education    PT Goals (Current goals can be found in the Care Plan section)  Acute Rehab PT Goals Patient Stated Goal: to decrease pain  PT Goal Formulation: With patient Time For Goal Achievement: 12/12/17 Potential to Achieve Goals: Fair    Frequency Min 2X/week   Barriers to discharge Other (comment) wife unable to provide physical assist per notes.     Co-evaluation               AM-PAC PT "6 Clicks" Daily Activity  Outcome Measure Difficulty turning over in bed (including adjusting bedclothes, sheets and blankets)?: Unable Difficulty moving from lying on back to sitting on the side of the bed? : Unable Difficulty sitting down on and standing up from a chair with arms (e.g., wheelchair, bedside commode, etc,.)?: Unable Help needed moving to and from a bed to chair (including a wheelchair)?: Total Help needed walking in hospital room?: Total Help needed climbing 3-5 steps with a railing? : Total 6 Click Score: 6    End of Session   Activity Tolerance: Patient limited by pain Patient left: in bed;with call bell/phone within reach Nurse Communication: Mobility status PT Visit Diagnosis: History of falling (Z91.81);Muscle weakness (generalized) (M62.81);Repeated falls (R29.6);Unsteadiness on feet (R26.81);Pain Pain - part of body: (back )    Time: 3729-0211 PT Time Calculation (min) (ACUTE ONLY): 10 min   Charges:   PT  Evaluation $PT Eval Moderate Complexity: [place], PT, DPT  Acute Rehabilitation Services  Pager: [phone] Office: [phone]   Rudean Hitt 11/28/2017, 6:41 PM

## 2017-11-28 NOTE — Progress Notes (Addendum)
CSW aware of consult. Per notes, patient is from home and his wife is his caretaker. Per notes, wife is unsure if she can manage patient at home any longer and has been looking into nursing home placement. CSW attempted to contact wife, Carlyon Shadow at [phone] and [phone], regarding patient disposition but unable to leave voicemail. CSW aware patient is still being worked up. CSW will continue to follow.  2:35pm- CSW spoke with wife who stated she can no longer take care of her husband at home. Per wife, patient is incontinent and unable to take care of himself and is requesting patient receive short term rehab. CSW explained that with patient's insurance it may take a couple of days to receive authorization and that patient cannot stay in ED to wait for placement. Per wife, she had patient's PCP fill out an FL2 and send it to [facility]. CSW followed up with Santiago Glad with [facility] who confirms she received FL2 but that they were not able to pursue authorization as there were no PT notes. CSW has requested that PT see patient. CSW to leave handoff for 2nd shift CSW.   Ollen Barges, [place] Work Department  [employer]  [phone]

## 2017-11-28 NOTE — ED Notes (Signed)
Spoke to MRI, pt oriented to questioning and alert.  Denies any issues with having MRI done.

## 2017-11-28 NOTE — Progress Notes (Signed)
Admitted pt to rm 3E15 from ED, pt alert and oriented, denied pain at this time, oriented to room, call bell placed within reach, placed on cardiac monitor, CCMD made aware.

## 2017-11-28 NOTE — ED Notes (Signed)
Report attempted 

## 2017-11-28 NOTE — ED Notes (Signed)
Date and time results received: 11/28/17 1438   Test: Potassium Critical Value: 2.6  Name of Provider Notified: Arlean Hopping, PA  Orders Received? Or Actions Taken?: None at this time.

## 2017-11-28 NOTE — ED Notes (Signed)
Advised pt has been stuck multiple times and phlebotomy is aware and will be over to attempt to collect outstanding labs.
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
## 2017-11-28 NOTE — ED Triage Notes (Addendum)
Pt arrived via [facility]; pt with c/o gernealized wkness and back pain; pt had fall x 2 times, one yesterday and again today with hitting head; per wife fall was sliding out of bed onto floor. Pt unable to care for self and wife unable to assist with care. 122/60, 104, 18, 94% on RA; CBG 81, pt on Metformin but not taking due to med causing patient to be "sick."  Pt is on Coumadin and has some Dementia; family trying to get pt placed into nsg facility

## 2017-11-28 NOTE — ED Notes (Signed)
MRI here to take patient, phlebotomy at bedside attempting to collect labs.  MRI states they will bring patient upstairs after MRI completed.

## 2017-11-29 ENCOUNTER — Observation Stay (HOSPITAL_COMMUNITY): Payer: Medicare HMO

## 2017-11-29 ENCOUNTER — Other Ambulatory Visit: Payer: Self-pay

## 2017-11-29 DIAGNOSIS — D510 Vitamin B12 deficiency anemia due to intrinsic factor deficiency: Secondary | ICD-10-CM

## 2017-11-29 DIAGNOSIS — E876 Hypokalemia: Secondary | ICD-10-CM | POA: Diagnosis not present

## 2017-11-29 DIAGNOSIS — M4805 Spinal stenosis, thoracolumbar region: Secondary | ICD-10-CM | POA: Diagnosis not present

## 2017-11-29 DIAGNOSIS — E291 Testicular hypofunction: Secondary | ICD-10-CM | POA: Diagnosis not present

## 2017-11-29 DIAGNOSIS — F0390 Unspecified dementia without behavioral disturbance: Secondary | ICD-10-CM | POA: Diagnosis not present

## 2017-11-29 DIAGNOSIS — E271 Primary adrenocortical insufficiency: Secondary | ICD-10-CM | POA: Diagnosis not present

## 2017-11-29 DIAGNOSIS — M545 Low back pain: Secondary | ICD-10-CM | POA: Diagnosis not present

## 2017-11-29 DIAGNOSIS — I10 Essential (primary) hypertension: Secondary | ICD-10-CM | POA: Diagnosis not present

## 2017-11-29 DIAGNOSIS — N183 Chronic kidney disease, stage 3 (moderate): Secondary | ICD-10-CM | POA: Diagnosis not present

## 2017-11-29 DIAGNOSIS — M47815 Spondylosis without myelopathy or radiculopathy, thoracolumbar region: Secondary | ICD-10-CM | POA: Diagnosis not present

## 2017-11-29 DIAGNOSIS — I5032 Chronic diastolic (congestive) heart failure: Secondary | ICD-10-CM | POA: Diagnosis not present

## 2017-11-29 DIAGNOSIS — E119 Type 2 diabetes mellitus without complications: Secondary | ICD-10-CM | POA: Diagnosis not present

## 2017-11-29 DIAGNOSIS — M549 Dorsalgia, unspecified: Secondary | ICD-10-CM | POA: Diagnosis not present

## 2017-11-29 LAB — BASIC METABOLIC PANEL
Anion gap: 8 (ref 5–15)
BUN: 26 mg/dL — AB (ref 8–23)
CALCIUM: 8.2 mg/dL — AB (ref 8.9–10.3)
CO2: 26 mmol/L (ref 22–32)
Chloride: 104 mmol/L (ref 98–111)
Creatinine, Ser: 1.91 mg/dL — ABNORMAL HIGH (ref 0.61–1.24)
GFR calc Af Amer: 38 mL/min — ABNORMAL LOW (ref >60)
GFR, EST NON AFRICAN AMERICAN: 32 mL/min — AB (ref >60)
GLUCOSE: 127 mg/dL — AB (ref 70–99)
Potassium: 3.4 mmol/L — ABNORMAL LOW (ref 3.5–5.1)
SODIUM: 138 mmol/L (ref 135–145)

## 2017-11-29 LAB — GLUCOSE, CAPILLARY
GLUCOSE-CAPILLARY: 193 mg/dL — AB (ref 70–99)
GLUCOSE-CAPILLARY: 145 mg/dL — AB (ref 70–99)
Glucose-Capillary: 101 mg/dL — ABNORMAL HIGH (ref 70–99)
Glucose-Capillary: 135 mg/dL — ABNORMAL HIGH (ref 70–99)

## 2017-11-29 LAB — CBC
HCT: 39.2 % (ref 39.0–52.0)
Hemoglobin: 12 g/dL — ABNORMAL LOW (ref 13.0–17.0)
MCH: 24.1 pg — AB (ref 26.0–34.0)
MCHC: 30.6 g/dL (ref 30.0–36.0)
MCV: 78.7 fL — ABNORMAL LOW (ref 80.0–100.0)
Platelets: 359 10*3/uL (ref 150–400)
RBC: 4.98 MIL/uL (ref 4.22–5.81)
RDW: 17 % — AB (ref 11.5–15.5)
WBC: 13.1 10*3/uL — ABNORMAL HIGH (ref 4.0–10.5)
nRBC: 0 % (ref 0.0–0.2)

## 2017-11-29 LAB — MAGNESIUM: MAGNESIUM: 2.2 mg/dL (ref 1.7–2.4)

## 2017-11-29 LAB — PROTIME-INR
INR: 2.39
Prothrombin Time: 25.8 seconds — ABNORMAL HIGH (ref 11.4–15.2)

## 2017-11-29 LAB — MRSA PCR SCREENING: MRSA by PCR: NEGATIVE

## 2017-11-29 MED ORDER — WARFARIN SODIUM 3 MG PO TABS
3.0000 mg | ORAL_TABLET | Freq: Once | ORAL | Status: AC
  Administered 2017-11-29: 3 mg via ORAL
  Filled 2017-11-29: qty 1

## 2017-11-29 MED ORDER — POTASSIUM CHLORIDE CRYS ER 20 MEQ PO TBCR
40.0000 meq | EXTENDED_RELEASE_TABLET | Freq: Two times a day (BID) | ORAL | Status: AC
  Administered 2017-11-29 (×2): 40 meq via ORAL
  Filled 2017-11-29 (×2): qty 2

## 2017-11-29 NOTE — Progress Notes (Signed)
ANTICOAGULATION CONSULT NOTE - Follow Up Consult  Pharmacy Consult for Coumadin Indication: hx recurrent VTE  No Known Allergies  Patient Measurements: Height: 5\' 10"  (177.8 cm) Weight: 198 lb 11.2 oz (90.1 kg) IBW/kg (Calculated) : 73  Vital Signs: Temp: 98 F (36.7 C) (11/05 0622) Temp Source: Oral (11/05 0622) BP: 108/63 (11/05 0622) Pulse Rate: 90 (11/05 0622)  Labs: Recent Labs    11/28/17 1355 11/28/17 2040 11/29/17 0436  HGB 13.2  --  12.0*  HCT 42.9  --  39.2  PLT 369  --  359  LABPROT  --  27.0* 25.8*  INR  --  2.55 2.39  CREATININE 1.79*  --  1.91*    Estimated Creatinine Clearance: 37.1 mL/min (A) (by C-G formula based on SCr of 1.91 mg/dL (H)).  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 Pharmacy has been consulted for warfarin dosing.     INR remains therapeutic (2.39).  Goal of Therapy:  INR 2-3 Monitor platelets by anticoagulation protocol: Yes   Plan:   Coumadin 3 mg again today. Usual dose.  Daily PT/INR for now.  [name], [place] Pager: [phone] or phone: [phone] 11/29/2017,11:02 AM

## 2017-11-29 NOTE — Consult Note (Signed)
   [facility] CM Inpatient Consult   11/29/2017  [name] 08-17-1940 [id number]  Patient screened for extreme hgih risk for unplanned readmission score has [facility] plan.  Brief chart review reveals patient is for skilled nursing facility for ST rehab. No [facility] follow up currently.  Please place a [facility] Care Management consult if needs changes or for questions contact:   Natividad Brood, RN BSN [facility] Liaison  [phone] business mobile phone Toll free office [phone]

## 2017-11-29 NOTE — Evaluation (Signed)
Occupational Therapy Evaluation Patient Details Name: [name] MRN: [id number] DOB: [birth year] Today's Date: 11/29/2017    History of Present Illness Pt is a 77 y/o male admitted secondary to worsening back pain and generalized weakness. Lumbar spine imaging revealed degenerative changes. Pt also found to have hypokalemia. PMH includes dementia. MRI: moderate to severe spinal stenosis T12-L1   Clinical Impression   This 77 yo male admitted with above presents to acute OT with a PLOF of being able to do his basic ADLs per chart, but now is setup/s-max A with +2 sit<>stand and stand pivot. He will benefit from acute OT with follow up OT at SNF to work back towards PLOF.    Follow Up Recommendations  SNF;Supervision/Assistance - 24 hour    Equipment Recommendations  Other (comment)(TBD at next venue)       Precautions / Restrictions Precautions Precautions: Fall;Back Precaution Comments: Pt reports falls at home. Reviewed back precautions to help with pain management (pt did remember back precautions from yesterday) Restrictions Weight Bearing Restrictions: No      Mobility Bed Mobility Overal bed mobility: Needs Assistance Bed Mobility: Rolling;Sidelying to Sit Rolling: Min assist Sidelying to sit: Mod assist       General bed mobility comments: Increased time  Transfers Overall transfer level: Needs assistance Equipment used: Rolling walker (2 wheeled) Transfers: Sit to/from [facility] Sit to Stand: Min assist;+2 physical assistance Stand pivot transfers: Mod assist;+2 physical assistance            Balance Overall balance assessment: Needs assistance Sitting-balance support: Feet supported;Bilateral upper extremity supported Sitting balance-Leahy Scale: Poor     Standing balance support: Bilateral upper extremity supported;During functional activity Standing balance-Leahy Scale: Poor Standing balance comment: reliant on RW and  additonal external support                           ADL either performed or assessed with clinical judgement   ADL Overall ADL's : Needs assistance/impaired Eating/Feeding: Set up Eating/Feeding Details (indicate cue type and reason): supported sitting Grooming: Set up;Supervision/safety;Wash/dry face;Oral care Grooming Details (indicate cue type and reason): supported sitting Upper Body Bathing: Set up;Supervision/ safety Upper Body Bathing Details (indicate cue type and reason): supported sitting Lower Body Bathing: Moderate assistance Lower Body Bathing Details (indicate cue type and reason): Min A +2 sit<>stand Upper Body Dressing : Minimal assistance Upper Body Dressing Details (indicate cue type and reason): supported sitting Lower Body Dressing: Maximal assistance Lower Body Dressing Details (indicate cue type and reason): Min A +2 sit<>stand   Toilet Transfer Details (indicate cue type and reason): Min A +2 sit<>stand from bed, Mod A +2 to turn from bed to recliner Toileting- Clothing Manipulation and Hygiene: Total assistance Toileting - Clothing Manipulation Details (indicate cue type and reason): Min A +2 sit<>stand             Vision Patient Visual Report: No change from baseline              Pertinent Vitals/Pain Pain Assessment: 0-10 Pain Score: 7  Pain Location: back  Pain Descriptors / Indicators: Guarding;Grimacing Pain Intervention(s): Limited activity within patient's tolerance;Monitored during session;Repositioned;Patient requesting pain meds-RN notified     Hand Dominance Right   Extremity/Trunk Assessment Upper Extremity Assessment Upper Extremity Assessment: Overall WFL for tasks assessed           Communication Communication Communication: No difficulties   Cognition Arousal/Alertness: Awake/alert Behavior During Therapy: [facility]  for tasks assessed/performed Overall Cognitive Status: No family/caregiver present to determine  baseline cognitive functioning                                 General Comments: Pt anxious about mobility tasks due to pain ("wait , wait, wait") and very guarded throughout. Pt with history of dementia.              Home Living Family/patient expects to be discharged to:: Skilled nursing facility Living Arrangements: Spouse/significant other Available Help at Discharge: Family;Available 24 hours/day Type of Home: House Home Access: Stairs to enter [employer] of Steps: 2   Home Layout: One level     Bathroom Shower/Tub: Occupational psychologist: Standard     Home Equipment: Environmental consultant - 2 wheels;Shower seat   Additional Comments: History obtained from previous admission as pt with dementia and reporting differing things. Per notes, wife unable to physically assist. Wife not present during session.       Prior Functioning/Environment Level of Independence: Independent with assistive device(s)        Comments: Ambulates with use of RW         OT Problem List: Decreased strength;Decreased range of motion;Impaired balance (sitting and/or standing);Pain;Decreased knowledge of use of DME or AE;Decreased cognition      OT Treatment/Interventions: Self-care/ADL training;Balance training;DME and/or AE instruction;Patient/family education;Therapeutic activities    OT Goals(Current goals can be found in the care plan section) Acute Rehab OT Goals Patient Stated Goal: to get OOB OT Goal Formulation: With patient Time For Goal Achievement: 12/13/17 Potential to Achieve Goals: Good  OT Frequency: Min 2X/week   Barriers to D/C: Decreased caregiver support             AM-PAC PT "6 Clicks" Daily Activity     Outcome Measure Help from another person eating meals?: None Help from another person taking care of personal grooming?: A Little Help from another person toileting, which includes using toliet, bedpan, or urinal?: A Lot Help from  another person bathing (including washing, rinsing, drying)?: A Lot Help from another person to put on and taking off regular upper body clothing?: A Little Help from another person to put on and taking off regular lower body clothing?: Total 6 Click Score: 15   End of Session Equipment Utilized During Treatment: Rolling walker Nurse Communication: Mobility status  Activity Tolerance: Patient limited by pain Patient left: in chair;with call bell/phone within reach;with chair alarm set  OT Visit Diagnosis: Unsteadiness on feet (R26.81);Other abnormalities of gait and mobility (R26.89);History of falling (Z91.81);Pain;Muscle weakness (generalized) (M62.81);Other symptoms and signs involving cognitive function Pain - part of body: (back)                Time: 1856-3149 OT Time Calculation (min): 22 min Charges:  OT General Charges $OT Visit: 1 Visit OT Evaluation $OT Eval Moderate Complexity: 1 Mod  [address], OTR/L Acute [employer] Pager [phone] Office [phone]     Almon Register 11/29/2017, 10:25 AM

## 2017-11-29 NOTE — Progress Notes (Signed)
Patient had an uneventful night. Remained safe. Denies any current patient needs. Will continue to assess for patient care.

## 2017-11-29 NOTE — Consult Note (Signed)
Reason for Consult: Back pain and leg weakness Referring Physician: [name] is an 77 y.o. male.  HPI: [name] is a 77 year old individual who has had significant and severe back pain for over 8 months.  Of time the history was obtained mostly from his wife and she notes that for the last 8 months he has had severe pain but he was able to walk with the use of a walker about 4 months ago however his walking substantially decreased and in the last 4 months she notes that he is been essentially bedridden he has a great deal of difficulty getting from bed to a chair and in the last several days his pain and weakness got so severe that he had a fall he was hospitalized because of this fall and further work-up now ensued.  The patient's wife notes that he has been incontinent for months now  The patient's wife notes that he has had a history of neuropathy in addition to some cognitive decline over the past few years and has had substantial pain in his back and his legs presumably because of the neuropathy.  He is seen by a neurologist at [facility].  An MRI and CT scan performed today demonstrates that the patient has diffuse spondylosis but at T12-L1 he has a rather severely inflamed degenerative process with evidence of synovial cyst at the T12-L1 level.  He has a moderate degree of stenosis at that level is suggested that he may have a fracture and a CT scan revealed the presence of the arthritic components but no evidence of an acute fracture.  Past Medical History:  Diagnosis Date  . Acute lower GI bleeding    a. admitted to [facility] 04/11/2013;  b. 04/2014 EGD: 1.  gastric erosions, mild prox gastritis and bulbar duodenitis->Protonix.  . Addison's disease ([place])   . Anemia   . Anemia in CKD (chronic kidney disease) 06/25/2015  . Chronic back pain   . Chronic diastolic CHF (congestive heart failure) ([place])    a. His initial ejection fraction was between 30 and 35%;  b. 04/2013 Echo: EF  50-55% Gr1 DD, mild-mod MR;  c. 04/2014 Echo: EF 60-65%, Gr 1 DD, triv TR.  . CKD (chronic kidney disease), stage III ([place])   . Coronary artery disease    a. 07/2014 - s/p difficult PCI - had pressure wire analysis of LAD s/p DES in mid segment c/b wire-induced dissection in the distal segment of the LAD which was treated with repeated balloon inflations. Also had DES to rPDA - again had either spasm distal to the stent placement or an edge dissection but the vessel was small in that area; procedure aborted.  . Daily headache   . GERD (gastroesophageal reflux disease)   . H/O hiatal hernia   . Hepatitis    a. 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  . History of aortic insufficiency   . Hx of cardiovascular stress test    a. ETT-Myoview 7/14:  Low risk, inferior defect consistent with thinning, no ischemia, normal wall motion, EF 54%  . Hypertension   . Hypogonadism male   . Obesity   . Peptic ulcer disease   . Pulmonary embolism ([place])    a. 04/2014 CTA: Bilat submassive PE ->coumadin.  . Pulmonary HTN ([place])   . Thyroid disease     Past Surgical History:  Procedure Laterality Date  . APPENDECTOMY  1950's  . CARDIAC CATHETERIZATION  06/26/2008   EF 30-35%  .  CARDIAC CATHETERIZATION N/A 08/20/2014   Procedure: Right/Left Heart Cath and Coronary Angiography;  Surgeon: Jolaine Artist, MD;  Location: [facility];  Service: Cardiovascular;  Laterality: N/A;  . CARDIAC CATHETERIZATION N/A 08/21/2014   Procedure: Coronary Stent Intervention;  Surgeon: Wellington Hampshire, MD;  Location: [facility];  Service: Cardiovascular;  Laterality: N/A;  . CARDIAC CATHETERIZATION N/A 11/24/2015   Procedure: Right/Left Heart Cath and Coronary Angiography;  Surgeon: Jolaine Artist, MD;  Location: [place];  Service: Cardiovascular;  Laterality: N/A;  . CHOLECYSTECTOMY  ~ 1965  . ESOPHAGOGASTRODUODENOSCOPY N/A 04/11/2013   Procedure: ESOPHAGOGASTRODUODENOSCOPY (EGD);  Surgeon: Missy Sabins, MD;  Location:  [facility] ENDOSCOPY;  Service: Endoscopy;  Laterality: N/A;  . ESOPHAGOGASTRODUODENOSCOPY N/A 05/13/2014   Procedure: ESOPHAGOGASTRODUODENOSCOPY (EGD);  Surgeon: Arta Silence, MD;  Location: [facility] ENDOSCOPY;  Service: Endoscopy;  Laterality: N/A;  . TONSILLECTOMY  1940's  . US ECHOCARDIOGRAPHY  04/09/2010   EF 60-65%    Family History  Problem Relation Age of Onset  . Clotting disorder Mother   . Clotting disorder Brother   . Clotting disorder Brother   . Clotting disorder Brother   . Clotting disorder Other        Niece.  Marland Kitchen Heart attack Neg Hx     Social History:  reports that he has never smoked. He has never used smokeless tobacco. He reports that he does not drink alcohol or use drugs.  Allergies: No Known Allergies  Medications: I have reviewed the patient's current medications.  Results for orders placed or performed during the hospital encounter of 11/28/17 (from the past 48 hour(s))  Basic metabolic panel     Status: Abnormal   Collection Time: 11/28/17  1:55 PM  Result Value Ref Range   Sodium 140 135 - 145 mmol/L   Potassium 2.6 (LL) 3.5 - 5.1 mmol/L    Comment: CRITICAL RESULT CALLED TO, READ BACK BY AND VERIFIED WITH: B MILLER,RN AT 1434 11/28/17 BY L BENFIELD    Chloride 102 98 - 111 mmol/L   CO2 27 22 - 32 mmol/L   Glucose, Bld 115 (H) 70 - 99 mg/dL   BUN 22 8 - 23 mg/dL   Creatinine, Ser 1.79 (H) 0.61 - 1.24 mg/dL   Calcium 8.9 8.9 - 10.3 mg/dL   GFR calc non Af Amer 35 (L) >60 mL/min   GFR calc Af Amer 41 (L) >60 mL/min    Comment: (NOTE) The eGFR has been calculated using the CKD EPI equation. This calculation has not been validated in all clinical situations. eGFR's persistently <60 mL/min signify possible Chronic Kidney Disease.    Anion gap 11 5 - 15    Comment: Performed at [facility] [address], [place], [place] [address]  CBC with Differential     Status: Abnormal   Collection Time: 11/28/17  1:55 PM  Result Value Ref Range   WBC 15.1 (H)  4.0 - 10.5 K/uL   RBC 5.40 4.22 - 5.81 MIL/uL   Hemoglobin 13.2 13.0 - 17.0 g/dL   HCT 42.9 39.0 - 52.0 %   MCV 79.4 (L) 80.0 - 100.0 fL   MCH 24.4 (L) 26.0 - 34.0 pg   MCHC 30.8 30.0 - 36.0 g/dL   RDW 17.0 (H) 11.5 - 15.5 %   Platelets 369 150 - 400 K/uL   nRBC 0.0 0.0 - 0.2 %   Neutrophils Relative % 64 %   Neutro Abs 9.7 (H)  1.7 - 7.7 K/uL   Lymphocytes Relative 22 %   Lymphs Abs 3.4 0.7 - 4.0 K/uL   Monocytes Relative 10 %   Monocytes Absolute 1.5 (H) 0.1 - 1.0 K/uL   Eosinophils Relative 3 %   Eosinophils Absolute 0.4 0.0 - 0.5 K/uL   Basophils Relative 1 %   Basophils Absolute 0.1 0.0 - 0.1 K/uL   Immature Granulocytes 0 %   Abs Immature Granulocytes 0.06 0.00 - 0.07 K/uL    Comment: Performed at [facility] [address], [place], [place] [address]  Hemoglobin A1c     Status: Abnormal   Collection Time: 11/28/17  8:40 PM  Result Value Ref Range   Hgb A1c MFr Bld 7.5 (H) 4.8 - 5.6 %    Comment: (NOTE) Pre diabetes:          5.7%-6.4% Diabetes:              >6.4% Glycemic control for   <7.0% adults with diabetes    Mean Plasma Glucose 168.55 mg/dL    Comment: Performed at [facility] [address], [place], [place] [address]  Protime-INR     Status: Abnormal   Collection Time: 11/28/17  8:40 PM  Result Value Ref Range   Prothrombin Time 27.0 (H) 11.4 - 15.2 seconds   INR 2.55     Comment: Performed at [facility] [address], [place], [place] [address]  Glucose, capillary     Status: Abnormal   Collection Time: 11/28/17 11:15 PM  Result Value Ref Range   Glucose-Capillary 114 (H) 70 - 99 mg/dL  Basic metabolic panel     Status: Abnormal   Collection Time: 11/29/17  4:36 AM  Result Value Ref Range   Sodium 138 135 - 145 mmol/L   Potassium 3.4 (L) 3.5 - 5.1 mmol/L   Chloride 104 98 - 111 mmol/L   CO2 26 22 - 32 mmol/L   Glucose, Bld 127 (H) 70 - 99 mg/dL   BUN 26 (H) 8 - 23 mg/dL   Creatinine, Ser 1.91 (H) 0.61 - 1.24 mg/dL    Calcium 8.2 (L) 8.9 - 10.3 mg/dL   GFR calc non Af Amer 32 (L) >60 mL/min   GFR calc Af Amer 38 (L) >60 mL/min    Comment: (NOTE) The eGFR has been calculated using the CKD EPI equation. This calculation has not been validated in all clinical situations. eGFR's persistently <60 mL/min signify possible Chronic Kidney Disease.    Anion gap 8 5 - 15    Comment: Performed at [facility] [address], [place], [place] [address]  CBC     Status: Abnormal   Collection Time: 11/29/17  4:36 AM  Result Value Ref Range   WBC 13.1 (H) 4.0 - 10.5 K/uL   RBC 4.98 4.22 - 5.81 MIL/uL   Hemoglobin 12.0 (L) 13.0 - 17.0 g/dL   HCT 39.2 39.0 - 52.0 %   MCV 78.7 (L) 80.0 - 100.0 fL   MCH 24.1 (L) 26.0 - 34.0 pg   MCHC 30.6 30.0 - 36.0 g/dL   RDW 17.0 (H) 11.5 - 15.5 %   Platelets 359 150 - 400 K/uL   nRBC 0.0 0.0 - 0.2 %    Comment: Performed at [facility], [place] [address], [place], [place] [address]  Magnesium     Status: None   Collection Time: 11/29/17  4:36 AM  Result Value Ref  Range   Magnesium 2.2 1.7 - 2.4 mg/dL    Comment: Performed at [facility] [address] [place], [place] [address]  Protime-INR     Status: Abnormal   Collection Time: 11/29/17  4:36 AM  Result Value Ref Range   Prothrombin Time 25.8 (H) 11.4 - 15.2 seconds   INR 2.39     Comment: Performed at [facility] [address], [place], [place] [address]  Glucose, capillary     Status: Abnormal   Collection Time: 11/29/17  9:34 AM  Result Value Ref Range   Glucose-Capillary 193 (H) 70 - 99 mg/dL  Glucose, capillary     Status: Abnormal   Collection Time: 11/29/17 12:32 PM  Result Value Ref Range   Glucose-Capillary 101 (H) 70 - 99 mg/dL    Dg Thoracic Spine 2 View  Result Date: 11/28/2017 CLINICAL DATA:  Acute mid back pain following fall today. Initial encounter. EXAM: THORACIC SPINE 2 VIEWS COMPARISON:  07/07/2017 chest radiograph and prior studies FINDINGS: No acute fracture or  subluxation identified. Mild multilevel degenerative disc disease noted. Probable DISH changes noted. No suspicious focal bony lesions are present. IMPRESSION: No acute abnormality.  Mild degenerative disc disease/spondylosis. Electronically Signed   By: Margarette Canada M.D.   On: 11/28/2017 13:41   Dg Lumbar Spine Complete  Result Date: 11/28/2017 CLINICAL DATA:  Status post fall today. Mid and low back pain and left hip pain. EXAM: LUMBAR SPINE - COMPLETE 4+ VIEW COMPARISON:  Thoracic spine series of today's date and lumbar spine series of November 15, 2017 FINDINGS: The twelfth ribs are hypoplastic. The lumbar vertebral bodies are preserved in height. There is multilevel disc space narrowing greatest at L3-4 and L5-S1. There is no spondylolisthesis. There are anterior and lateral bridging osteophytes at multiple levels. The pedicles and transverse processes are intact. There is chronic reversal of the normal lumbar lordosis. IMPRESSION: Multilevel degenerative disc and facet joint changes. No compression fracture, spondylolisthesis, nor other acute bony abnormality of the lumbar spine. Electronically Signed   By: David  Martinique M.D.   On: 11/28/2017 13:37   Ct Lumbar Spine Wo Contrast  Result Date: 11/29/2017 CLINICAL DATA:  Back pain, progressive. EXAM: CT LUMBAR SPINE WITHOUT CONTRAST TECHNIQUE: Multidetector CT imaging of the lumbar spine was performed without intravenous contrast administration. Multiplanar CT image reconstructions were also generated. COMPARISON:  Lumbar spine MRI 11/28/2017 Lumbar spine radiograph 11/15/2017 Chest CT 04/22/2017 Lumbar spine MRI 09/29/2016 CT abdomen pelvis 03/20/2012 FINDINGS: Segmentation: 5 lumbar type vertebrae. Alignment: Grade 1 retrolisthesis at L4-5. Vertebrae: There is no acute fracture. No lytic or blastic lesions. No evidence of discitis-osteomyelitis. Paraspinal and other soft tissues: Right renal cyst measures up to 5.3 cm. There is calcific aortic  atherosclerosis. Disc levels: Assessment of the spinal canal and neural foramina is better characterized by the MRI of 11/28/2017. At the T12-L1 level, there is disc vacuum phenomenon without acute abnormality. There is moderate-to-severe facet arthrosis with a large anterior and left lateral osteophyte. Compared to the CT of 03/20/2012, there has been progressive widening of the anterior aspect of the disc space. The appearance is unchanged, however, since the radiograph of 11/15/2017. The widening is mildly progressed since 04/22/2017. IMPRESSION: 1. No acute fracture or other acute osseous abnormality at the T12-L1 level. 2. Progressive widening of the anterior disc space at T12-L1 with associated moderate-to-severe facet arthrosis may indicate a degree of chronic segmental instability. 3. Assessment of the spinal canal and neural foramina  is better characterized on yesterday's lumbar spine MRI. No change since that study. 4.  Aortic atherosclerosis (ICD10-I70.0). Electronically Signed   By: Ulyses Jarred M.D.   On: 11/29/2017 01:19   Mr Thoracic Spine Wo Contrast  Result Date: 11/28/2017 CLINICAL DATA:  Initial evaluation for acute/progressive myelopathy, back pain. EXAM: MRI THORACIC AND LUMBAR SPINE WITHOUT CONTRAST TECHNIQUE: Multiplanar and multiecho pulse sequences of the thoracic and lumbar spine were obtained without intravenous contrast. COMPARISON:  Prior radiograph from earlier the same day as well as previous MRI from 09/29/2016. FINDINGS: MRI THORACIC SPINE FINDINGS Alignment: Vertebral bodies normally aligned with preservation of the normal thoracic kyphosis. No listhesis. Vertebrae: The vertebral body heights extending from T1 through T11 are maintained without evidence for acute or chronic fracture. T12-L1 discussed below on lumbar spine portion of this exam. Underlying bone marrow signal intensity mildly heterogeneous but within normal limits. Small benign hemangiomas noted within the T10  vertebral body. No worrisome osseous lesions. Abnormal marrow edema about the T12-L1 interspace, discussed below. Cord: Signal intensity within the thoracic spinal cord within normal limits. Paraspinal and other soft tissues: Soft tissue edema about the T12-L1 interspace, discussed below. Paraspinous soft tissues demonstrate no other acute abnormality. 16 mm well-circumscribed cystic lesion within the subcutaneous fat of the left mid back likely reflects a sebaceous cyst. Trace bilateral pleural effusions noted. Few T2 hyperintense cyst noted within the visualized kidneys. Disc levels: T1-2: Mild diffuse disc bulge. Moderate facet hypertrophy, greater on the left. No significant spinal stenosis. Moderate bilateral neural foraminal narrowing. T2-3: Moderate facet hypertrophy with resultant mild to moderate bilateral foraminal narrowing. No spinal stenosis. T3-4:  Unremarkable. T4-5:  Mild facet hypertrophy.  No stenosis. T5-6:  Unremarkable. T6-7:  Mild diffuse disc bulge.  No significant stenosis. T7-8:  Unremarkable. T8-9: Minimal disc bulge. Mild facet hypertrophy. No significant stenosis. T9-10: Diffuse disc bulge. Posterior element hypertrophy. No significant stenosis. T10-11: Mild disc bulge. Posterior element hypertrophy. No significant stenosis. T11-12:  Mild facet hypertrophy.  No stenosis. MRI LUMBAR SPINE FINDINGS Segmentation: Normal segmentation. Lowest well-formed disc labeled the L5-S1 level. Same numbering system employed as on previous exams. Alignment: Straightening with slight reversal of the normal lumbar lordosis, stable. No interval malalignment. Vertebrae: There is abnormal edema involving the inferior endplate of V56, and to a lesser extent the adjacent superior endplate of L1. Edema extends into the right greater than left posterior elements. Associated edema within the intervening T12-L1 interspace. Prominent soft tissue edema present within the right greater than left posterior and psoas  musculature bilaterally. No discrete collections identified. While these findings are nonspecific, and may in part be degenerative in nature, sequelae of possible traumatic injury could be considered given the history of recent fall. There is question of a possible fracture through the inferior endplate of E33 and/or I95-J8 disc, although no definite discrete fracture line evident by MRI. Degenerative ask you disc phenomena within the T12-L1 disc space felt to make infection unlikely. Vertebral body heights otherwise maintained without evidence for acute or chronic fracture. Underlying bone marrow signal intensity heterogeneous but within normal limits. No worrisome osseous lesions. Conus medullaris and cauda equina: Conus extends to the T12-L1 level. Conus and cauda equina appear normal. Paraspinal and other soft tissues: Paraspinous edema adjacent to the T12-L1 interspace as above, right greater than left. Paraspinous soft tissues otherwise within normal limits. Scattered renal cysts noted bilaterally. Disc levels: T12-L1: Disc bulge with disc desiccation and intervertebral disc space narrowing. Slight asymmetric widening of the anterior  T12-L1 disc space. Edema within the adjacent inferior T12 vertebral body, raising the possibility for possible occult endplate fracture. Edema present within the intervening T12-L1 disc space as well as the superior aspect of L1. Extension into the posterior elements, greater on the right. Superimposed moderate facet hypertrophy. 13 mm synovial cyst at the posterior aspect of the left T12-L1 facet. Probable additional tiny 5 mm synovial cyst at the anterior margin of the left T12-L1 facet (series 25, image 9). Resultant moderate to severe spinal stenosis, progressed from previous. Moderate bilateral foraminal narrowing. L1-2: Diffuse disc bulge with mild reactive endplate changes. No significant spinal stenosis. Mild left L1 foraminal narrowing. L2-3: Mild disc bulge with disc  desiccation and intervertebral disc space narrowing. No significant spinal stenosis. No significant foraminal encroachment. L3-4: Chronic severe intervertebral disc space narrowing with diffuse disc bulge and disc desiccation. Mild reactive endplate changes with marginal endplate osteophytic spurring. Mild facet hypertrophy. Resultant moderate canal and bilateral subarticular stenosis. Mild to moderate bilateral L3 foraminal narrowing, right worse than left. L4-5: Diffuse disc bulge with disc desiccation and intervertebral disc space narrowing. Facet and ligamentum flavum hypertrophy. Epidural lipomatosis. Moderate left lateral recess narrowing, stable from previous. Moderate left with mild right L4 foraminal narrowing, also stable. L5-S1: Chronic intervertebral disc space narrowing with diffuse disc bulge and disc desiccation. Chronic reactive endplate changes with marginal endplate osteophytic spurring. Superimposed right paracentral disc extrusion with superior migration, stable. Epidural lipomatosis. Bilateral facet hypertrophy. Resultant moderate to advanced bilateral L5 foraminal narrowing with moderate bilateral lateral recess stenosis, stable. IMPRESSION: MR THORACIC SPINE IMPRESSION 1. Abnormal marrow edema involving the T12 and L1 vertebral bodies, with extension into the right greater than left posterior elements. While these findings are likely at least partly degenerative in nature, appearance and history raises the possibility for superimposed acute and/or recent traumatic injury, with possible fracture through the inferior endplate of K24 and/or O97-D5 disc space. Further assessment with dedicated CT recommended for further evaluation. Resultant moderate to severe spinal stenosis at this level has mildly worsened from previous. 2. No other acute traumatic injury within the thoracic spine. 3. Additional fairly mild multilevel degenerative spondylolysis as above without significant spinal stenosis.  Moderate bilateral foraminal narrowing within the upper thoracic spine related to facet hypertrophy as above. MR LUMBAR SPINE IMPRESSION 1. Overall stable appearance of the lumbar spine with no other acute traumatic injury or other finding identified. 2. Stable multifactorial degenerative changes with resultant moderate canal and lateral recess stenosis at L3-4. 3. Left eccentric disc osteophyte at L4-5 with resultant moderate left lateral recess stenosis, stable. 4. Multifactorial degenerative changes with resultant multilevel foraminal narrowing as above, moderate on the left at L4 and bilaterally at L5. Current attempt being made to contact the ordering provider. Results will be communicated as soon as possible. Electronically Signed   By: Jeannine Boga M.D.   On: 11/28/2017 23:00   Mr Lumbar Spine Wo Contrast  Result Date: 11/28/2017 CLINICAL DATA:  Initial evaluation for acute/progressive myelopathy, back pain. EXAM: MRI THORACIC AND LUMBAR SPINE WITHOUT CONTRAST TECHNIQUE: Multiplanar and multiecho pulse sequences of the thoracic and lumbar spine were obtained without intravenous contrast. COMPARISON:  Prior radiograph from earlier the same day as well as previous MRI from 09/29/2016. FINDINGS: MRI THORACIC SPINE FINDINGS Alignment: Vertebral bodies normally aligned with preservation of the normal thoracic kyphosis. No listhesis. Vertebrae: The vertebral body heights extending from T1 through T11 are maintained without evidence for acute or chronic fracture. T12-L1 discussed below on lumbar spine  portion of this exam. Underlying bone marrow signal intensity mildly heterogeneous but within normal limits. Small benign hemangiomas noted within the T10 vertebral body. No worrisome osseous lesions. Abnormal marrow edema about the T12-L1 interspace, discussed below. Cord: Signal intensity within the thoracic spinal cord within normal limits. Paraspinal and other soft tissues: Soft tissue edema about the  T12-L1 interspace, discussed below. Paraspinous soft tissues demonstrate no other acute abnormality. 16 mm well-circumscribed cystic lesion within the subcutaneous fat of the left mid back likely reflects a sebaceous cyst. Trace bilateral pleural effusions noted. Few T2 hyperintense cyst noted within the visualized kidneys. Disc levels: T1-2: Mild diffuse disc bulge. Moderate facet hypertrophy, greater on the left. No significant spinal stenosis. Moderate bilateral neural foraminal narrowing. T2-3: Moderate facet hypertrophy with resultant mild to moderate bilateral foraminal narrowing. No spinal stenosis. T3-4:  Unremarkable. T4-5:  Mild facet hypertrophy.  No stenosis. T5-6:  Unremarkable. T6-7:  Mild diffuse disc bulge.  No significant stenosis. T7-8:  Unremarkable. T8-9: Minimal disc bulge. Mild facet hypertrophy. No significant stenosis. T9-10: Diffuse disc bulge. Posterior element hypertrophy. No significant stenosis. T10-11: Mild disc bulge. Posterior element hypertrophy. No significant stenosis. T11-12:  Mild facet hypertrophy.  No stenosis. MRI LUMBAR SPINE FINDINGS Segmentation: Normal segmentation. Lowest well-formed disc labeled the L5-S1 level. Same numbering system employed as on previous exams. Alignment: Straightening with slight reversal of the normal lumbar lordosis, stable. No interval malalignment. Vertebrae: There is abnormal edema involving the inferior endplate of Y19, and to a lesser extent the adjacent superior endplate of L1. Edema extends into the right greater than left posterior elements. Associated edema within the intervening T12-L1 interspace. Prominent soft tissue edema present within the right greater than left posterior and psoas musculature bilaterally. No discrete collections identified. While these findings are nonspecific, and may in part be degenerative in nature, sequelae of possible traumatic injury could be considered given the history of recent fall. There is question of a  possible fracture through the inferior endplate of J09 and/or T26-Z1 disc, although no definite discrete fracture line evident by MRI. Degenerative ask you disc phenomena within the T12-L1 disc space felt to make infection unlikely. Vertebral body heights otherwise maintained without evidence for acute or chronic fracture. Underlying bone marrow signal intensity heterogeneous but within normal limits. No worrisome osseous lesions. Conus medullaris and cauda equina: Conus extends to the T12-L1 level. Conus and cauda equina appear normal. Paraspinal and other soft tissues: Paraspinous edema adjacent to the T12-L1 interspace as above, right greater than left. Paraspinous soft tissues otherwise within normal limits. Scattered renal cysts noted bilaterally. Disc levels: T12-L1: Disc bulge with disc desiccation and intervertebral disc space narrowing. Slight asymmetric widening of the anterior T12-L1 disc space. Edema within the adjacent inferior T12 vertebral body, raising the possibility for possible occult endplate fracture. Edema present within the intervening T12-L1 disc space as well as the superior aspect of L1. Extension into the posterior elements, greater on the right. Superimposed moderate facet hypertrophy. 13 mm synovial cyst at the posterior aspect of the left T12-L1 facet. Probable additional tiny 5 mm synovial cyst at the anterior margin of the left T12-L1 facet (series 25, image 9). Resultant moderate to severe spinal stenosis, progressed from previous. Moderate bilateral foraminal narrowing. L1-2: Diffuse disc bulge with mild reactive endplate changes. No significant spinal stenosis. Mild left L1 foraminal narrowing. L2-3: Mild disc bulge with disc desiccation and intervertebral disc space narrowing. No significant spinal stenosis. No significant foraminal encroachment. L3-4: Chronic severe intervertebral disc space  narrowing with diffuse disc bulge and disc desiccation. Mild reactive endplate changes  with marginal endplate osteophytic spurring. Mild facet hypertrophy. Resultant moderate canal and bilateral subarticular stenosis. Mild to moderate bilateral L3 foraminal narrowing, right worse than left. L4-5: Diffuse disc bulge with disc desiccation and intervertebral disc space narrowing. Facet and ligamentum flavum hypertrophy. Epidural lipomatosis. Moderate left lateral recess narrowing, stable from previous. Moderate left with mild right L4 foraminal narrowing, also stable. L5-S1: Chronic intervertebral disc space narrowing with diffuse disc bulge and disc desiccation. Chronic reactive endplate changes with marginal endplate osteophytic spurring. Superimposed right paracentral disc extrusion with superior migration, stable. Epidural lipomatosis. Bilateral facet hypertrophy. Resultant moderate to advanced bilateral L5 foraminal narrowing with moderate bilateral lateral recess stenosis, stable. IMPRESSION: MR THORACIC SPINE IMPRESSION 1. Abnormal marrow edema involving the T12 and L1 vertebral bodies, with extension into the right greater than left posterior elements. While these findings are likely at least partly degenerative in nature, appearance and history raises the possibility for superimposed acute and/or recent traumatic injury, with possible fracture through the inferior endplate of H06 and/or C37-S2 disc space. Further assessment with dedicated CT recommended for further evaluation. Resultant moderate to severe spinal stenosis at this level has mildly worsened from previous. 2. No other acute traumatic injury within the thoracic spine. 3. Additional fairly mild multilevel degenerative spondylolysis as above without significant spinal stenosis. Moderate bilateral foraminal narrowing within the upper thoracic spine related to facet hypertrophy as above. MR LUMBAR SPINE IMPRESSION 1. Overall stable appearance of the lumbar spine with no other acute traumatic injury or other finding identified. 2. Stable  multifactorial degenerative changes with resultant moderate canal and lateral recess stenosis at L3-4. 3. Left eccentric disc osteophyte at L4-5 with resultant moderate left lateral recess stenosis, stable. 4. Multifactorial degenerative changes with resultant multilevel foraminal narrowing as above, moderate on the left at L4 and bilaterally at L5. Current attempt being made to contact the ordering provider. Results will be communicated as soon as possible. Electronically Signed   By: Jeannine Boga M.D.   On: 11/28/2017 23:00   Dg Hip Unilat W Or Wo Pelvis 2-3 Views Left  Result Date: 11/28/2017 CLINICAL DATA:  Acute LEFT hip pain following fall today. Initial encounter. EXAM: DG HIP (WITH OR WITHOUT PELVIS) 2-3V LEFT COMPARISON:  03/20/2012 abdomen/pelvis CT. FINDINGS: No acute fracture, subluxation or dislocation noted. Mild degenerative changes in both hips noted. Mild to moderate degenerative changes in the LOWER lumbar spine again identified. No suspicious focal bony lesions noted. IMPRESSION: 1. No acute abnormality 2. 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 Electronically Signed   By: Margarette Canada M.D.   On: 11/28/2017 13:39    Review of Systems  Constitutional: Positive for malaise/fatigue.  Eyes: Negative.   Respiratory: Negative.   Cardiovascular: Negative.   Gastrointestinal:       Incontinence of bowel  Genitourinary:       Incontinence  Musculoskeletal: Positive for back pain.  Neurological: Positive for tingling, sensory change and weakness.  Endo/Heme/Allergies: Negative.   Psychiatric/Behavioral: Negative.    Blood pressure 123/88, pulse 88, temperature 99 F (37.2 C), temperature source Oral, resp. rate 19, height _0  (1.778 m), weight 90.1 kg, SpO2 96 %. Physical Exam  Constitutional: He is oriented to person, place, and time. He appears well-developed and well-nourished.  HENT:  Head: Normocephalic and atraumatic.  Eyes: Pupils are equal,  round, and reactive to light. Conjunctivae and EOM are normal.  Neck: Normal range of  motion. Neck supple.  Musculoskeletal:  Tone and bulk in the major groups appears to be intact.  Neurological: He is alert and oriented to person, place, and time. He has normal reflexes.  Strength to confrontation his lower extremities appears normal in the iliopsoas the quads tibialis anterior and gastrocs.  The patient was not ambulated by me but reports from the nurse note that he required the help of 2 individuals to stand and then his legs were quite weak such that he could not sustain himself in the standing up position  Skin: Skin is warm and dry.  Psychiatric: He has a normal mood and affect. His behavior is normal.    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  I discussed the situation with the patient who is not keen on any surgical intervention I then contacted the patient's wife and discussed the situation with her noting that surgery would be fairly involved to decompress and stabilize the T12-L1 level.  The degree to which this would help any of his neurologic problems such as the strength in his legs and his continence issues is very guarded and the risks are substantial.  The patient's wife would like to see how he does with some rehabilitative efforts and this is what is being planned at the current time I noted that we can see how he does for period of a couple of weeks and if things are not improving ultimately we can decide on the surgery subsequently.  Because his incontinence has been going on for such a long period of time I am hesitant to suggest that a decompression at the T12-L1 level is likely to change that however could help ultimately with managing his pain.  I noted to the patient's wife that I will be out of town for a couple of weeks we will get certainly see and reevaluate him down the road to check his progress with  the ongoing complaints.  Blanchie Dessert [name] 11/29/2017, 4:20 PM
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
## 2017-11-29 NOTE — Clinical Social Work Note (Addendum)
Uploaded requested documents into [place] [employer] for PASARR review.  Dayton Scrape, CSW [phone]  1:42 pm PASARR obtained: [id number] E. Expires 12/5. Insurance authorization still pending.  Dayton Scrape, [place] [phone]  4:26 pm Insurance authorization still pending.  Dayton Scrape, [place]

## 2017-11-29 NOTE — Progress Notes (Signed)
PROGRESS NOTE    [name]  SVX:[id number] DOB: 11-08-1940 DOA: 11/28/2017 PCP: Lawerance Cruel, MD   Brief Narrative:  77 year old male history of dementia, CKD, stage III, Addison's disease, PE, hypertension, hypothyroidism presented with back pain.  MRI and CT show chronic issues.  PT OT recommending SNF.  Social work consulted. Assessment & Plan   Back pain -Appears to be chronic -Patient with progressive associated weakness -MRI thoracic spine showed some abnormal marrow edema T12-L1, question possibility of superimposed acute or recent traumatic injury, CT was recommended for further evaluation.  Moderate to severe spinal stenosis at this level has mildly worsened from previous.  Overall stable appearance of lumbar spine, no other acute traumatic injury. -CT lumbar spine showed no acute fracture or other acute osseous abnormality T12-L1. -Continue pain control -PT recommended SNF -Social work consulted, pending SNF approval -will discuss findings of imaging with neurosurgery  Hypokalemia -Continue to replete and monitor BMP -Magnesium 2.2  Dementia -Patient's wife unable to care for him at home due to progressive dementia  Hypothyroidism -continue armour -TSH 2.237 in June 2019  Chronic kidney disease, stage III -Creatinine currently stable, continue to monitor BMP  Addison's  -Continue hydrocortisone -Does not appear to need stress dose steroids at this time  Essential hypertension -Continue Coreg  Chronic diastolic heart failure -Echocardiogram March 2019 showed an EF of 50 to 23%, grade 1 diastolic dysfunction -Currently appears to be euvolemic and compensated -Lasix held  History of recurrent VTE -Continue Coumadin per pharmacy, INR currently 2.39  Diabetes mellitus, type II -Continue Lantus, insulin sliding scale and CBG monitoring -Hemoglobin A1c 7.5  DVT Prophylaxis  Coumadin  Code Status: Full  Family Communication: none at  bedside  Disposition Plan: Currently in observation; pending SNF  Consultants None  Procedures  None  Antibiotics   Anti-infectives (From admission, onward)   None      Subjective:   Ronelle Truett seen and examined today.  Patient with dementia. Complains of back pain but states he always has it. Believes he is at home.   Objective:   Vitals:   11/28/17 2241 11/28/17 2344 11/29/17 0622 11/29/17 1233  BP: 116/75 101/74 108/63 123/88  Pulse: 95 96 90 88  Resp:  18 18 19   Temp: 99.1 F (37.3 C) 98.9 F (37.2 C) 98 F (36.7 C) 99 F (37.2 C)  TempSrc: Oral Oral Oral Oral  SpO2: 96% 96% 98% 96%  Weight: 89.4 kg  90.1 kg   Height: 5\' 10"  (1.778 m)       Intake/Output Summary (Last 24 hours) at 11/29/2017 1407 Last data filed at 11/29/2017 3007 Gross per 24 hour  Intake 568.58 ml  Output -  Net 568.58 ml   Filed Weights   11/28/17 2241 11/29/17 0622  Weight: 89.4 kg 90.1 kg    Exam  General: Well developed, well nourished, NAD, appears stated age  81: NCAT, mucous membranes moist.   Neck: Supple  Cardiovascular: S1 S2 auscultated, no murmurs, RRR  Respiratory: Clear to auscultation bilaterally with equal chest rise  Abdomen: Soft, nontender, nondistended, + bowel sounds  Extremities: warm dry without cyanosis clubbing or edema  Neuro: AAOx1 (self only), nonfocal  Psych: Confused, appropriate   Data Reviewed: I have personally reviewed following labs and imaging studies  CBC: Recent Labs  Lab 11/28/17 1355 11/29/17 0436  WBC 15.1* 13.1*  NEUTROABS 9.7*  --   HGB 13.2 12.0*  HCT 42.9 39.2  MCV 79.4* 78.7*  PLT 369  324   Basic Metabolic Panel: Recent Labs  Lab 11/28/17 1355 11/29/17 0436  NA 140 138  K 2.6* 3.4*  CL 102 104  CO2 27 26  GLUCOSE 115* 127*  BUN 22 26*  CREATININE 1.79* 1.91*  CALCIUM 8.9 8.2*  MG  --  2.2   GFR: Estimated Creatinine Clearance: 37.1 mL/min (A) (by C-G formula based on SCr of 1.91 mg/dL  (H)). Liver Function Tests: No results for input(s): AST, ALT, ALKPHOS, BILITOT, PROT, ALBUMIN in the last 168 hours. No results for input(s): LIPASE, AMYLASE in the last 168 hours. No results for input(s): AMMONIA in the last 168 hours. Coagulation Profile: Recent Labs  Lab 11/28/17 2040 11/29/17 0436  INR 2.55 2.39   Cardiac Enzymes: No results for input(s): CKTOTAL, CKMB, CKMBINDEX, TROPONINI in the last 168 hours. BNP (last 3 results) No results for input(s): PROBNP in the last 8760 hours. HbA1C: Recent Labs    11/28/17 2040  HGBA1C 7.5*   CBG: Recent Labs  Lab 11/28/17 2315 11/29/17 0934 11/29/17 1232  GLUCAP 114* 193* 101*   Lipid Profile: No results for input(s): CHOL, HDL, LDLCALC, TRIG, CHOLHDL, LDLDIRECT in the last 72 hours. Thyroid Function Tests: No results for input(s): TSH, T4TOTAL, FREET4, T3FREE, THYROIDAB in the last 72 hours. Anemia Panel: No results for input(s): VITAMINB12, FOLATE, FERRITIN, TIBC, IRON, RETICCTPCT in the last 72 hours. Urine analysis:    Component Value Date/Time   COLORURINE YELLOW 11/15/2017 1358   APPEARANCEUR HAZY (A) 11/15/2017 1358   LABSPEC 1.017 11/15/2017 1358   PHURINE 6.0 11/15/2017 1358   GLUCOSEU NEGATIVE 11/15/2017 1358   HGBUR NEGATIVE 11/15/2017 [place] 11/15/2017 1358   KETONESUR NEGATIVE 11/15/2017 1358   PROTEINUR NEGATIVE 11/15/2017 1358   UROBILINOGEN 0.2 03/21/2007 1015   NITRITE NEGATIVE 11/15/2017 1358   LEUKOCYTESUR NEGATIVE 11/15/2017 1358   Sepsis Labs: @LABRCNTIP (procalcitonin:4,lacticidven:4)  )No results found for this or any previous visit (from the past 240 hour(s)).    Radiology Studies: Dg Thoracic Spine 2 View  Result Date: 11/28/2017 CLINICAL DATA:  Acute mid back pain following fall today. Initial encounter. EXAM: THORACIC SPINE 2 VIEWS COMPARISON:  07/07/2017 chest radiograph and prior studies FINDINGS: No acute fracture or subluxation identified. Mild  multilevel degenerative disc disease noted. Probable DISH changes noted. No suspicious focal bony lesions are present. IMPRESSION: No acute abnormality.  Mild degenerative disc disease/spondylosis. Electronically Signed   By: Margarette Canada M.D.   On: 11/28/2017 13:41   Dg Lumbar Spine Complete  Result Date: 11/28/2017 CLINICAL DATA:  Status post fall today. Mid and low back pain and left hip pain. EXAM: LUMBAR SPINE - COMPLETE 4+ VIEW COMPARISON:  Thoracic spine series of today's date and lumbar spine series of November 15, 2017 FINDINGS: The twelfth ribs are hypoplastic. The lumbar vertebral bodies are preserved in height. There is multilevel disc space narrowing greatest at L3-4 and L5-S1. There is no spondylolisthesis. There are anterior and lateral bridging osteophytes at multiple levels. The pedicles and transverse processes are intact. There is chronic reversal of the normal lumbar lordosis. IMPRESSION: Multilevel degenerative disc and facet joint changes. No compression fracture, spondylolisthesis, nor other acute bony abnormality of the lumbar spine. Electronically Signed   By: David  Martinique M.D.   On: 11/28/2017 13:37   Ct Lumbar Spine Wo Contrast  Result Date: 11/29/2017 CLINICAL DATA:  Back pain, progressive. EXAM: CT LUMBAR SPINE WITHOUT CONTRAST TECHNIQUE: Multidetector CT imaging of the lumbar spine was performed without intravenous  contrast administration. Multiplanar CT image reconstructions were also generated. COMPARISON:  Lumbar spine MRI 11/28/2017 Lumbar spine radiograph 11/15/2017 Chest CT 04/22/2017 Lumbar spine MRI 09/29/2016 CT abdomen pelvis 03/20/2012 FINDINGS: Segmentation: 5 lumbar type vertebrae. Alignment: Grade 1 retrolisthesis at L4-5. Vertebrae: There is no acute fracture. No lytic or blastic lesions. No evidence of discitis-osteomyelitis. Paraspinal and other soft tissues: Right renal cyst measures up to 5.3 cm. There is calcific aortic atherosclerosis. Disc levels: Assessment  of the spinal canal and neural foramina is better characterized by the MRI of 11/28/2017. At the T12-L1 level, there is disc vacuum phenomenon without acute abnormality. There is moderate-to-severe facet arthrosis with a large anterior and left lateral osteophyte. Compared to the CT of 03/20/2012, there has been progressive widening of the anterior aspect of the disc space. The appearance is unchanged, however, since the radiograph of 11/15/2017. The widening is mildly progressed since 04/22/2017. IMPRESSION: 1. No acute fracture or other acute osseous abnormality at the T12-L1 level. 2. Progressive widening of the anterior disc space at T12-L1 with associated moderate-to-severe facet arthrosis may indicate a degree of chronic segmental instability. 3. Assessment of the spinal canal and neural foramina is better characterized on yesterday's lumbar spine MRI. No change since that study. 4.  Aortic atherosclerosis (ICD10-I70.0). Electronically Signed   By: Ulyses Jarred M.D.   On: 11/29/2017 01:19   Mr Thoracic Spine Wo Contrast  Result Date: 11/28/2017 CLINICAL DATA:  Initial evaluation for acute/progressive myelopathy, back pain. EXAM: MRI THORACIC AND LUMBAR SPINE WITHOUT CONTRAST TECHNIQUE: Multiplanar and multiecho pulse sequences of the thoracic and lumbar spine were obtained without intravenous contrast. COMPARISON:  Prior radiograph from earlier the same day as well as previous MRI from 09/29/2016. FINDINGS: MRI THORACIC SPINE FINDINGS Alignment: Vertebral bodies normally aligned with preservation of the normal thoracic kyphosis. No listhesis. Vertebrae: The vertebral body heights extending from T1 through T11 are maintained without evidence for acute or chronic fracture. T12-L1 discussed below on lumbar spine portion of this exam. Underlying bone marrow signal intensity mildly heterogeneous but within normal limits. Small benign hemangiomas noted within the T10 vertebral body. No worrisome osseous  lesions. Abnormal marrow edema about the T12-L1 interspace, discussed below. Cord: Signal intensity within the thoracic spinal cord within normal limits. Paraspinal and other soft tissues: Soft tissue edema about the T12-L1 interspace, discussed below. Paraspinous soft tissues demonstrate no other acute abnormality. 16 mm well-circumscribed cystic lesion within the subcutaneous fat of the left mid back likely reflects a sebaceous cyst. Trace bilateral pleural effusions noted. Few T2 hyperintense cyst noted within the visualized kidneys. Disc levels: T1-2: Mild diffuse disc bulge. Moderate facet hypertrophy, greater on the left. No significant spinal stenosis. Moderate bilateral neural foraminal narrowing. T2-3: Moderate facet hypertrophy with resultant mild to moderate bilateral foraminal narrowing. No spinal stenosis. T3-4:  Unremarkable. T4-5:  Mild facet hypertrophy.  No stenosis. T5-6:  Unremarkable. T6-7:  Mild diffuse disc bulge.  No significant stenosis. T7-8:  Unremarkable. T8-9: Minimal disc bulge. Mild facet hypertrophy. No significant stenosis. T9-10: Diffuse disc bulge. Posterior element hypertrophy. No significant stenosis. T10-11: Mild disc bulge. Posterior element hypertrophy. No significant stenosis. T11-12:  Mild facet hypertrophy.  No stenosis. MRI LUMBAR SPINE FINDINGS Segmentation: Normal segmentation. Lowest well-formed disc labeled the L5-S1 level. Same numbering system employed as on previous exams. Alignment: Straightening with slight reversal of the normal lumbar lordosis, stable. No interval malalignment. Vertebrae: There is abnormal edema involving the inferior endplate of D98, and to a lesser extent the adjacent  superior endplate of L1. Edema extends into the right greater than left posterior elements. Associated edema within the intervening T12-L1 interspace. Prominent soft tissue edema present within the right greater than left posterior and psoas musculature bilaterally. No discrete  collections identified. While these findings are nonspecific, and may in part be degenerative in nature, sequelae of possible traumatic injury could be considered given the history of recent fall. There is question of a possible fracture through the inferior endplate of M84 and/or X32-G4 disc, although no definite discrete fracture line evident by MRI. Degenerative ask you disc phenomena within the T12-L1 disc space felt to make infection unlikely. Vertebral body heights otherwise maintained without evidence for acute or chronic fracture. Underlying bone marrow signal intensity heterogeneous but within normal limits. No worrisome osseous lesions. Conus medullaris and cauda equina: Conus extends to the T12-L1 level. Conus and cauda equina appear normal. Paraspinal and other soft tissues: Paraspinous edema adjacent to the T12-L1 interspace as above, right greater than left. Paraspinous soft tissues otherwise within normal limits. Scattered renal cysts noted bilaterally. Disc levels: T12-L1: Disc bulge with disc desiccation and intervertebral disc space narrowing. Slight asymmetric widening of the anterior T12-L1 disc space. Edema within the adjacent inferior T12 vertebral body, raising the possibility for possible occult endplate fracture. Edema present within the intervening T12-L1 disc space as well as the superior aspect of L1. Extension into the posterior elements, greater on the right. Superimposed moderate facet hypertrophy. 13 mm synovial cyst at the posterior aspect of the left T12-L1 facet. Probable additional tiny 5 mm synovial cyst at the anterior margin of the left T12-L1 facet (series 25, image 9). Resultant moderate to severe spinal stenosis, progressed from previous. Moderate bilateral foraminal narrowing. L1-2: Diffuse disc bulge with mild reactive endplate changes. No significant spinal stenosis. Mild left L1 foraminal narrowing. L2-3: Mild disc bulge with disc desiccation and intervertebral disc  space narrowing. No significant spinal stenosis. No significant foraminal encroachment. L3-4: Chronic severe intervertebral disc space narrowing with diffuse disc bulge and disc desiccation. Mild reactive endplate changes with marginal endplate osteophytic spurring. Mild facet hypertrophy. Resultant moderate canal and bilateral subarticular stenosis. Mild to moderate bilateral L3 foraminal narrowing, right worse than left. L4-5: Diffuse disc bulge with disc desiccation and intervertebral disc space narrowing. Facet and ligamentum flavum hypertrophy. Epidural lipomatosis. Moderate left lateral recess narrowing, stable from previous. Moderate left with mild right L4 foraminal narrowing, also stable. L5-S1: Chronic intervertebral disc space narrowing with diffuse disc bulge and disc desiccation. Chronic reactive endplate changes with marginal endplate osteophytic spurring. Superimposed right paracentral disc extrusion with superior migration, stable. Epidural lipomatosis. Bilateral facet hypertrophy. Resultant moderate to advanced bilateral L5 foraminal narrowing with moderate bilateral lateral recess stenosis, stable. IMPRESSION: MR THORACIC SPINE IMPRESSION 1. Abnormal marrow edema involving the T12 and L1 vertebral bodies, with extension into the right greater than left posterior elements. While these findings are likely at least partly degenerative in nature, appearance and history raises the possibility for superimposed acute and/or recent traumatic injury, with possible fracture through the inferior endplate of W10 and/or U72-Z3 disc space. Further assessment with dedicated CT recommended for further evaluation. Resultant moderate to severe spinal stenosis at this level has mildly worsened from previous. 2. No other acute traumatic injury within the thoracic spine. 3. Additional fairly mild multilevel degenerative spondylolysis as above without significant spinal stenosis. Moderate bilateral foraminal narrowing  within the upper thoracic spine related to facet hypertrophy as above. MR LUMBAR SPINE IMPRESSION 1. Overall stable appearance of  the lumbar spine with no other acute traumatic injury or other finding identified. 2. Stable multifactorial degenerative changes with resultant moderate canal and lateral recess stenosis at L3-4. 3. Left eccentric disc osteophyte at L4-5 with resultant moderate left lateral recess stenosis, stable. 4. Multifactorial degenerative changes with resultant multilevel foraminal narrowing as above, moderate on the left at L4 and bilaterally at L5. Current attempt being made to contact the ordering provider. Results will be communicated as soon as possible. Electronically Signed   By: Jeannine Boga M.D.   On: 11/28/2017 23:00   Mr Lumbar Spine Wo Contrast  Result Date: 11/28/2017 CLINICAL DATA:  Initial evaluation for acute/progressive myelopathy, back pain. EXAM: MRI THORACIC AND LUMBAR SPINE WITHOUT CONTRAST TECHNIQUE: Multiplanar and multiecho pulse sequences of the thoracic and lumbar spine were obtained without intravenous contrast. COMPARISON:  Prior radiograph from earlier the same day as well as previous MRI from 09/29/2016. FINDINGS: MRI THORACIC SPINE FINDINGS Alignment: Vertebral bodies normally aligned with preservation of the normal thoracic kyphosis. No listhesis. Vertebrae: The vertebral body heights extending from T1 through T11 are maintained without evidence for acute or chronic fracture. T12-L1 discussed below on lumbar spine portion of this exam. Underlying bone marrow signal intensity mildly heterogeneous but within normal limits. Small benign hemangiomas noted within the T10 vertebral body. No worrisome osseous lesions. Abnormal marrow edema about the T12-L1 interspace, discussed below. Cord: Signal intensity within the thoracic spinal cord within normal limits. Paraspinal and other soft tissues: Soft tissue edema about the T12-L1 interspace, discussed below.  Paraspinous soft tissues demonstrate no other acute abnormality. 16 mm well-circumscribed cystic lesion within the subcutaneous fat of the left mid back likely reflects a sebaceous cyst. Trace bilateral pleural effusions noted. Few T2 hyperintense cyst noted within the visualized kidneys. Disc levels: T1-2: Mild diffuse disc bulge. Moderate facet hypertrophy, greater on the left. No significant spinal stenosis. Moderate bilateral neural foraminal narrowing. T2-3: Moderate facet hypertrophy with resultant mild to moderate bilateral foraminal narrowing. No spinal stenosis. T3-4:  Unremarkable. T4-5:  Mild facet hypertrophy.  No stenosis. T5-6:  Unremarkable. T6-7:  Mild diffuse disc bulge.  No significant stenosis. T7-8:  Unremarkable. T8-9: Minimal disc bulge. Mild facet hypertrophy. No significant stenosis. T9-10: Diffuse disc bulge. Posterior element hypertrophy. No significant stenosis. T10-11: Mild disc bulge. Posterior element hypertrophy. No significant stenosis. T11-12:  Mild facet hypertrophy.  No stenosis. MRI LUMBAR SPINE FINDINGS Segmentation: Normal segmentation. Lowest well-formed disc labeled the L5-S1 level. Same numbering system employed as on previous exams. Alignment: Straightening with slight reversal of the normal lumbar lordosis, stable. No interval malalignment. Vertebrae: There is abnormal edema involving the inferior endplate of L84, and to a lesser extent the adjacent superior endplate of L1. Edema extends into the right greater than left posterior elements. Associated edema within the intervening T12-L1 interspace. Prominent soft tissue edema present within the right greater than left posterior and psoas musculature bilaterally. No discrete collections identified. While these findings are nonspecific, and may in part be degenerative in nature, sequelae of possible traumatic injury could be considered given the history of recent fall. There is question of a possible fracture through the  inferior endplate of T36 and/or I68-E3 disc, although no definite discrete fracture line evident by MRI. Degenerative ask you disc phenomena within the T12-L1 disc space felt to make infection unlikely. Vertebral body heights otherwise maintained without evidence for acute or chronic fracture. Underlying bone marrow signal intensity heterogeneous but within normal limits. No worrisome osseous lesions. Conus medullaris and  cauda equina: Conus extends to the T12-L1 level. Conus and cauda equina appear normal. Paraspinal and other soft tissues: Paraspinous edema adjacent to the T12-L1 interspace as above, right greater than left. Paraspinous soft tissues otherwise within normal limits. Scattered renal cysts noted bilaterally. Disc levels: T12-L1: Disc bulge with disc desiccation and intervertebral disc space narrowing. Slight asymmetric widening of the anterior T12-L1 disc space. Edema within the adjacent inferior T12 vertebral body, raising the possibility for possible occult endplate fracture. Edema present within the intervening T12-L1 disc space as well as the superior aspect of L1. Extension into the posterior elements, greater on the right. Superimposed moderate facet hypertrophy. 13 mm synovial cyst at the posterior aspect of the left T12-L1 facet. Probable additional tiny 5 mm synovial cyst at the anterior margin of the left T12-L1 facet (series 25, image 9). Resultant moderate to severe spinal stenosis, progressed from previous. Moderate bilateral foraminal narrowing. L1-2: Diffuse disc bulge with mild reactive endplate changes. No significant spinal stenosis. Mild left L1 foraminal narrowing. L2-3: Mild disc bulge with disc desiccation and intervertebral disc space narrowing. No significant spinal stenosis. No significant foraminal encroachment. L3-4: Chronic severe intervertebral disc space narrowing with diffuse disc bulge and disc desiccation. Mild reactive endplate changes with marginal endplate  osteophytic spurring. Mild facet hypertrophy. Resultant moderate canal and bilateral subarticular stenosis. Mild to moderate bilateral L3 foraminal narrowing, right worse than left. L4-5: Diffuse disc bulge with disc desiccation and intervertebral disc space narrowing. Facet and ligamentum flavum hypertrophy. Epidural lipomatosis. Moderate left lateral recess narrowing, stable from previous. Moderate left with mild right L4 foraminal narrowing, also stable. L5-S1: Chronic intervertebral disc space narrowing with diffuse disc bulge and disc desiccation. Chronic reactive endplate changes with marginal endplate osteophytic spurring. Superimposed right paracentral disc extrusion with superior migration, stable. Epidural lipomatosis. Bilateral facet hypertrophy. Resultant moderate to advanced bilateral L5 foraminal narrowing with moderate bilateral lateral recess stenosis, stable. IMPRESSION: MR THORACIC SPINE IMPRESSION 1. Abnormal marrow edema involving the T12 and L1 vertebral bodies, with extension into the right greater than left posterior elements. While these findings are likely at least partly degenerative in nature, appearance and history raises the possibility for superimposed acute and/or recent traumatic injury, with possible fracture through the inferior endplate of O97 and/or D53-G9 disc space. Further assessment with dedicated CT recommended for further evaluation. Resultant moderate to severe spinal stenosis at this level has mildly worsened from previous. 2. No other acute traumatic injury within the thoracic spine. 3. Additional fairly mild multilevel degenerative spondylolysis as above without significant spinal stenosis. Moderate bilateral foraminal narrowing within the upper thoracic spine related to facet hypertrophy as above. MR LUMBAR SPINE IMPRESSION 1. Overall stable appearance of the lumbar spine with no other acute traumatic injury or other finding identified. 2. Stable multifactorial  degenerative changes with resultant moderate canal and lateral recess stenosis at L3-4. 3. Left eccentric disc osteophyte at L4-5 with resultant moderate left lateral recess stenosis, stable. 4. Multifactorial degenerative changes with resultant multilevel foraminal narrowing as above, moderate on the left at L4 and bilaterally at L5. Current attempt being made to contact the ordering provider. Results will be communicated as soon as possible. Electronically Signed   By: Jeannine Boga M.D.   On: 11/28/2017 23:00   Dg Hip Unilat W Or Wo Pelvis 2-3 Views Left  Result Date: 11/28/2017 CLINICAL DATA:  Acute LEFT hip pain following fall today. Initial encounter. EXAM: DG HIP (WITH OR WITHOUT PELVIS) 2-3V LEFT COMPARISON:  03/20/2012 abdomen/pelvis CT. FINDINGS:  No acute fracture, subluxation or dislocation noted. Mild degenerative changes in both hips noted. Mild to moderate degenerative changes in the LOWER lumbar spine again identified. No suspicious focal bony lesions noted. IMPRESSION: 1. No acute abnormality 2. 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 Electronically Signed   By: Margarette Canada M.D.   On: 11/28/2017 13:39     Scheduled Meds: . carvedilol  6.25 mg Oral BID WC  . docusate sodium  100 mg Oral BID  . folic acid  076 mcg Oral Daily  . hydrocortisone  10 mg Oral Q24H  . hydrocortisone  20 mg Oral BID AC & HS  . insulin aspart  0-15 Units Subcutaneous TID WC  . insulin glargine  26 Units Subcutaneous QHS  . lidocaine  1 patch Transdermal Q24H  . megestrol  40 mg Oral Daily  . potassium chloride  40 mEq Oral BID  . thyroid  60 mg Oral Daily  . traZODone  100 mg Oral QHS  . vitamin B-12  1,000 mcg Oral Daily  . warfarin  3 mg Oral ONCE-1800  . Warfarin - Pharmacist Dosing Inpatient   Does not apply q1800   Continuous Infusions:   LOS: 0 days   Time Spent in minutes   30 minutes  Geraldin Habermehl D.O. on 11/29/2017 at 2:07 PM  Between 7am to 7pm - Please see  pager noted on [email or web address]  After 7pm go to [email or web address]  And look for the night coverage person covering for me after hours  [facility] Office  [phone]

## 2017-11-30 DIAGNOSIS — G8929 Other chronic pain: Secondary | ICD-10-CM | POA: Diagnosis not present

## 2017-11-30 DIAGNOSIS — M545 Low back pain: Secondary | ICD-10-CM | POA: Diagnosis not present

## 2017-11-30 LAB — GLUCOSE, CAPILLARY
Glucose-Capillary: 105 mg/dL — ABNORMAL HIGH (ref 70–99)
Glucose-Capillary: 126 mg/dL — ABNORMAL HIGH (ref 70–99)
Glucose-Capillary: 120 mg/dL — ABNORMAL HIGH (ref 70–99)
Glucose-Capillary: 93 mg/dL (ref 70–99)

## 2017-11-30 LAB — BASIC METABOLIC PANEL
ANION GAP: 5 (ref 5–15)
BUN: 24 mg/dL — ABNORMAL HIGH (ref 8–23)
CALCIUM: 8.6 mg/dL — AB (ref 8.9–10.3)
CO2: 26 mmol/L (ref 22–32)
Chloride: 107 mmol/L (ref 98–111)
Creatinine, Ser: 1.63 mg/dL — ABNORMAL HIGH (ref 0.61–1.24)
GFR calc Af Amer: 46 mL/min — ABNORMAL LOW (ref >60)
GFR calc non Af Amer: 39 mL/min — ABNORMAL LOW (ref >60)
GLUCOSE: 100 mg/dL — AB (ref 70–99)
POTASSIUM: 4.1 mmol/L (ref 3.5–5.1)
Sodium: 138 mmol/L (ref 135–145)

## 2017-11-30 LAB — CBC
HCT: 39.6 % (ref 39.0–52.0)
HEMOGLOBIN: 11.8 g/dL — AB (ref 13.0–17.0)
MCH: 23.5 pg — AB (ref 26.0–34.0)
MCHC: 29.8 g/dL — ABNORMAL LOW (ref 30.0–36.0)
MCV: 78.9 fL — AB (ref 80.0–100.0)
Platelets: 382 10*3/uL (ref 150–400)
RBC: 5.02 MIL/uL (ref 4.22–5.81)
RDW: 17.1 % — ABNORMAL HIGH (ref 11.5–15.5)
WBC: 12.3 10*3/uL — ABNORMAL HIGH (ref 4.0–10.5)
nRBC: 0 % (ref 0.0–0.2)

## 2017-11-30 LAB — PROTIME-INR
INR: 2.41
PROTHROMBIN TIME: 25.9 s — AB (ref 11.4–15.2)

## 2017-11-30 MED ORDER — WARFARIN SODIUM 3 MG PO TABS
3.0000 mg | ORAL_TABLET | ORAL | Status: DC
  Administered 2017-11-30 – 2017-12-01 (×2): 3 mg via ORAL
  Filled 2017-11-30 (×2): qty 1

## 2017-11-30 MED ORDER — OXYCODONE HCL 5 MG PO TABS
5.0000 mg | ORAL_TABLET | ORAL | Status: DC | PRN
  Administered 2017-12-01: 5 mg via ORAL
  Filled 2017-11-30: qty 1

## 2017-11-30 MED ORDER — WARFARIN SODIUM 1 MG PO TABS: 1.5000 mg | ORAL_TABLET | ORAL | Status: DC

## 2017-11-30 NOTE — Progress Notes (Signed)
Patient Demographics:    [name], is a 77 y.o. male, DOB - 1941/01/23, OZD:[id number]  Admit date - 11/28/2017   Admitting Physician Karmen Bongo, MD  Outpatient Primary MD for the patient is Lawerance Cruel, MD  LOS - 0   Chief Complaint  Patient presents with  . Back Pain    Generalized wkness        Subjective:    [name] today has no fevers, no emesis,  No chest pain, no new concerns  Assessment  & Plan :    Principal Problem:   Back pain Active Problems:   Hypothyroidism   Hypogonadism male   CKD (chronic kidney disease) stage 3, GFR 30-59 ml/min (HCC)   Addison disease (HCC)   Benign essential HTN   Chronic diastolic CHF (congestive heart failure) (HCC)   Diabetes mellitus without complication (HCC)   Dementia (HCC)   Hypokalemia  Brief Summary:- 76 y.o.malewith medical history significant ofhypothyroidism; submassive PE (2016); HTN; CAD s/p stent; stage 3 CKD; dementia; and Addison's disease admitted on 11/28/2017 with worsening back pain and fecal and urinary incontinence   Plan:- 1) worsening chronic back pain--due to Severe spondylosis and stenosis at T12-L1 with an acute inflammatory process at that level----neurosurgeon recommends rehab and surgical intervention only if patient fails rehab in the future at which time T12-L1 level decompression may be considered to help manage his pain however it may not have patient continues as this has been present for a while now... PT eval appreciated pain management as ordered  2)CKD III--- stable, avoid nephrotoxic agent  3) Addison's disease----continue home dose hydrocortisone  4)HFpEF--history of chronic diastolic dysfunction CHF with last known EF of 50 to 55%, no acute exacerbation at this time, clinically patient appears compensated and euvolemic, Lasix on hold due to decreased oral intake , continue  Coreg  5)H/o recurrent VTE--- Coumadin per pharmacy INR has been therapeutic  6)DM2--- last A1c 7.5, overall fair control, c/n Lantus and Use Novolog/Humalog Sliding scale insulin with Accu-Cheks/Fingersticks as ordered   7)Hypothyroidism--- recent TSH 2.2, continue Armour Thyroid  8)Dementia----progressive, patient requiring more and more help/care at home than wife can provide  Disposition/Need for in-Hospital Stay- patient unable to be discharged at this time due to awaiting insurance approval for SNF rehab placement  Code Status : full   Disposition Plan  : SNF  Consults  : NeuroSurgery   DVT Prophylaxis  : Coumadin per pharmacy  Lab Results  Component Value Date   PLT 382 11/30/2017    Inpatient Medications  Scheduled Meds: . carvedilol  6.25 mg Oral BID WC  . docusate sodium  100 mg Oral BID  . folic acid  259 mcg Oral Daily  . hydrocortisone  10 mg Oral Q24H  . hydrocortisone  20 mg Oral BID AC & HS  . insulin aspart  0-15 Units Subcutaneous TID WC  . insulin glargine  26 Units Subcutaneous QHS  . lidocaine  1 patch Transdermal Q24H  . megestrol  40 mg Oral Daily  . thyroid  60 mg Oral Daily  . traZODone  100 mg Oral QHS  . vitamin B-12  1,000 mcg Oral Daily  . warfarin  3 mg Oral Once per day on Mon Tue  Wed Thu Fri Sat   And  . [START ON 12/04/2017] warfarin  1.5 mg Oral Once per day on Sun  . Warfarin - Pharmacist Dosing Inpatient   Does not apply q1800   Continuous Infusions: PRN Meds:.acetaminophen **OR** acetaminophen, ondansetron **OR** ondansetron (ZOFRAN) IV, oxyCODONE    Anti-infectives (From admission, onward)   None        Objective:   Vitals:   11/30/17 0012 11/30/17 0532 11/30/17 1246 11/30/17 1657  BP: (!) 142/79 (!) 159/95 129/82 (!) 146/80  Pulse: 81 90 82 91  Resp: 18 18 18 16   Temp: 98.8 F (37.1 C) 98.3 F (36.8 C) (!) 100.9 F (38.3 C) 98 F (36.7 C)  TempSrc: Oral Oral Axillary Oral  SpO2: 94% 91% 96% 94%  Weight:   93.9 kg    Height:        Wt Readings from Last 3 Encounters:  11/30/17 93.9 kg  07/11/17 101.1 kg  04/25/17 108 kg     Intake/Output Summary (Last 24 hours) at 11/30/2017 1822 Last data filed at 11/30/2017 1820 Gross per 24 hour  Intake 850 ml  Output 1150 ml  Net -300 ml     Physical Exam Patient is examined daily including today on 11/30/17 , exams remain the same as of yesterday except that has changed   Gen:- Awake Alert, no acute distress HEENT:- [place].AT, No sclera icterus Neck-Supple Neck,No JVD,.  Lungs-  CTAB good air movement CV- S1, S2 normal, regular Abd-  +ve B.Sounds, Abd Soft, No tenderness,    Extremity/Skin:- No  edema, fair pulses Psych-affect is flat, patient with cognitive deficits Neuro-generalized weakness without  new focal deficits, no tremors   Data Review:   Micro Results Recent Results (from the past 240 hour(s))  MRSA PCR Screening     Status: None   Collection Time: 11/29/17  2:53 PM  Result Value Ref Range Status   MRSA by PCR NEGATIVE NEGATIVE Final    Comment:        The GeneXpert MRSA Assay (FDA approved for NASAL specimens only), is one component of a comprehensive MRSA colonization surveillance program. It is not intended to diagnose MRSA infection nor to guide or monitor treatment for MRSA infections.     Radiology Reports Dg Thoracic Spine 2 View  Result Date: 11/28/2017 CLINICAL DATA:  Acute mid back pain following fall today. Initial encounter. EXAM: THORACIC SPINE 2 VIEWS COMPARISON:  07/07/2017 chest radiograph and prior studies FINDINGS: No acute fracture or subluxation identified. Mild multilevel degenerative disc disease noted. Probable DISH changes noted. No suspicious focal bony lesions are present. IMPRESSION: No acute abnormality.  Mild degenerative disc disease/spondylosis. Electronically Signed   By: Margarette Canada M.D.   On: 11/28/2017 13:41   Dg Lumbar Spine Complete  Result Date: 11/28/2017 CLINICAL DATA:   Status post fall today. Mid and low back pain and left hip pain. EXAM: LUMBAR SPINE - COMPLETE 4+ VIEW COMPARISON:  Thoracic spine series of today's date and lumbar spine series of November 15, 2017 FINDINGS: The twelfth ribs are hypoplastic. The lumbar vertebral bodies are preserved in height. There is multilevel disc space narrowing greatest at L3-4 and L5-S1. There is no spondylolisthesis. There are anterior and lateral bridging osteophytes at multiple levels. The pedicles and transverse processes are intact. There is chronic reversal of the normal lumbar lordosis. IMPRESSION: Multilevel degenerative disc and facet joint changes. No compression fracture, spondylolisthesis, nor other acute bony abnormality of the lumbar spine. Electronically Signed  By: David  Martinique M.D.   On: 11/28/2017 13:37   Dg Lumbar Spine Complete  Result Date: 11/15/2017 CLINICAL DATA:  [place] 2 days ago, chronic low back pain EXAM: LUMBAR SPINE - COMPLETE 4+ VIEW COMPARISON:  MR lumbar spine of 09/29/2016, CT abdomen pelvis of 03/20/2012 FINDINGS: The lumbar vertebrae remain straightened in alignment. There is diffuse degenerative disc disease present greatest at L3-4 and L5-S1 levels. No new compression deformity is seen. There are degenerative changes throughout the facet joints of the lumbar spine. The SI joints appear corticated. The bowel gas pattern is nonspecific. IMPRESSION: No change in straightened alignment. Diffuse degenerative disc disease. No compression deformity. Electronically Signed   By: Ivar Drape M.D.   On: 11/15/2017 12:28   Ct Lumbar Spine Wo Contrast  Result Date: 11/29/2017 CLINICAL DATA:  Back pain, progressive. EXAM: CT LUMBAR SPINE WITHOUT CONTRAST TECHNIQUE: Multidetector CT imaging of the lumbar spine was performed without intravenous contrast administration. Multiplanar CT image reconstructions were also generated. COMPARISON:  Lumbar spine MRI 11/28/2017 Lumbar spine radiograph 11/15/2017 Chest CT  04/22/2017 Lumbar spine MRI 09/29/2016 CT abdomen pelvis 03/20/2012 FINDINGS: Segmentation: 5 lumbar type vertebrae. Alignment: Grade 1 retrolisthesis at L4-5. Vertebrae: There is no acute fracture. No lytic or blastic lesions. No evidence of discitis-osteomyelitis. Paraspinal and other soft tissues: Right renal cyst measures up to 5.3 cm. There is calcific aortic atherosclerosis. Disc levels: Assessment of the spinal canal and neural foramina is better characterized by the MRI of 11/28/2017. At the T12-L1 level, there is disc vacuum phenomenon without acute abnormality. There is moderate-to-severe facet arthrosis with a large anterior and left lateral osteophyte. Compared to the CT of 03/20/2012, there has been progressive widening of the anterior aspect of the disc space. The appearance is unchanged, however, since the radiograph of 11/15/2017. The widening is mildly progressed since 04/22/2017. IMPRESSION: 1. No acute fracture or other acute osseous abnormality at the T12-L1 level. 2. Progressive widening of the anterior disc space at T12-L1 with associated moderate-to-severe facet arthrosis may indicate a degree of chronic segmental instability. 3. Assessment of the spinal canal and neural foramina is better characterized on yesterday's lumbar spine MRI. No change since that study. 4.  Aortic atherosclerosis (ICD10-I70.0). Electronically Signed   By: Ulyses Jarred M.D.   On: 11/29/2017 01:19   Mr Thoracic Spine Wo Contrast  Result Date: 11/28/2017 CLINICAL DATA:  Initial evaluation for acute/progressive myelopathy, back pain. EXAM: MRI THORACIC AND LUMBAR SPINE WITHOUT CONTRAST TECHNIQUE: Multiplanar and multiecho pulse sequences of the thoracic and lumbar spine were obtained without intravenous contrast. COMPARISON:  Prior radiograph from earlier the same day as well as previous MRI from 09/29/2016. FINDINGS: MRI THORACIC SPINE FINDINGS Alignment: Vertebral bodies normally aligned with preservation of the  normal thoracic kyphosis. No listhesis. Vertebrae: The vertebral body heights extending from T1 through T11 are maintained without evidence for acute or chronic fracture. T12-L1 discussed below on lumbar spine portion of this exam. Underlying bone marrow signal intensity mildly heterogeneous but within normal limits. Small benign hemangiomas noted within the T10 vertebral body. No worrisome osseous lesions. Abnormal marrow edema about the T12-L1 interspace, discussed below. Cord: Signal intensity within the thoracic spinal cord within normal limits. Paraspinal and other soft tissues: Soft tissue edema about the T12-L1 interspace, discussed below. Paraspinous soft tissues demonstrate no other acute abnormality. 16 mm well-circumscribed cystic lesion within the subcutaneous fat of the left mid back likely reflects a sebaceous cyst. Trace bilateral pleural effusions noted. Few T2 hyperintense cyst  noted within the visualized kidneys. Disc levels: T1-2: Mild diffuse disc bulge. Moderate facet hypertrophy, greater on the left. No significant spinal stenosis. Moderate bilateral neural foraminal narrowing. T2-3: Moderate facet hypertrophy with resultant mild to moderate bilateral foraminal narrowing. No spinal stenosis. T3-4:  Unremarkable. T4-5:  Mild facet hypertrophy.  No stenosis. T5-6:  Unremarkable. T6-7:  Mild diffuse disc bulge.  No significant stenosis. T7-8:  Unremarkable. T8-9: Minimal disc bulge. Mild facet hypertrophy. No significant stenosis. T9-10: Diffuse disc bulge. Posterior element hypertrophy. No significant stenosis. T10-11: Mild disc bulge. Posterior element hypertrophy. No significant stenosis. T11-12:  Mild facet hypertrophy.  No stenosis. MRI LUMBAR SPINE FINDINGS Segmentation: Normal segmentation. Lowest well-formed disc labeled the L5-S1 level. Same numbering system employed as on previous exams. Alignment: Straightening with slight reversal of the normal lumbar lordosis, stable. No interval  malalignment. Vertebrae: There is abnormal edema involving the inferior endplate of Q22, and to a lesser extent the adjacent superior endplate of L1. Edema extends into the right greater than left posterior elements. Associated edema within the intervening T12-L1 interspace. Prominent soft tissue edema present within the right greater than left posterior and psoas musculature bilaterally. No discrete collections identified. While these findings are nonspecific, and may in part be degenerative in nature, sequelae of possible traumatic injury could be considered given the history of recent fall. There is question of a possible fracture through the inferior endplate of W97 and/or L89-Q1 disc, although no definite discrete fracture line evident by MRI. Degenerative ask you disc phenomena within the T12-L1 disc space felt to make infection unlikely. Vertebral body heights otherwise maintained without evidence for acute or chronic fracture. Underlying bone marrow signal intensity heterogeneous but within normal limits. No worrisome osseous lesions. Conus medullaris and cauda equina: Conus extends to the T12-L1 level. Conus and cauda equina appear normal. Paraspinal and other soft tissues: Paraspinous edema adjacent to the T12-L1 interspace as above, right greater than left. Paraspinous soft tissues otherwise within normal limits. Scattered renal cysts noted bilaterally. Disc levels: T12-L1: Disc bulge with disc desiccation and intervertebral disc space narrowing. Slight asymmetric widening of the anterior T12-L1 disc space. Edema within the adjacent inferior T12 vertebral body, raising the possibility for possible occult endplate fracture. Edema present within the intervening T12-L1 disc space as well as the superior aspect of L1. Extension into the posterior elements, greater on the right. Superimposed moderate facet hypertrophy. 13 mm synovial cyst at the posterior aspect of the left T12-L1 facet. Probable additional  tiny 5 mm synovial cyst at the anterior margin of the left T12-L1 facet (series 25, image 9). Resultant moderate to severe spinal stenosis, progressed from previous. Moderate bilateral foraminal narrowing. L1-2: Diffuse disc bulge with mild reactive endplate changes. No significant spinal stenosis. Mild left L1 foraminal narrowing. L2-3: Mild disc bulge with disc desiccation and intervertebral disc space narrowing. No significant spinal stenosis. No significant foraminal encroachment. L3-4: Chronic severe intervertebral disc space narrowing with diffuse disc bulge and disc desiccation. Mild reactive endplate changes with marginal endplate osteophytic spurring. Mild facet hypertrophy. Resultant moderate canal and bilateral subarticular stenosis. Mild to moderate bilateral L3 foraminal narrowing, right worse than left. L4-5: Diffuse disc bulge with disc desiccation and intervertebral disc space narrowing. Facet and ligamentum flavum hypertrophy. Epidural lipomatosis. Moderate left lateral recess narrowing, stable from previous. Moderate left with mild right L4 foraminal narrowing, also stable. L5-S1: Chronic intervertebral disc space narrowing with diffuse disc bulge and disc desiccation. Chronic reactive endplate changes with marginal endplate osteophytic spurring. Superimposed  right paracentral disc extrusion with superior migration, stable. Epidural lipomatosis. Bilateral facet hypertrophy. Resultant moderate to advanced bilateral L5 foraminal narrowing with moderate bilateral lateral recess stenosis, stable. IMPRESSION: MR THORACIC SPINE IMPRESSION 1. Abnormal marrow edema involving the T12 and L1 vertebral bodies, with extension into the right greater than left posterior elements. While these findings are likely at least partly degenerative in nature, appearance and history raises the possibility for superimposed acute and/or recent traumatic injury, with possible fracture through the inferior endplate of N36  and/or T12-L1 disc space. Further assessment with dedicated CT recommended for further evaluation. Resultant moderate to severe spinal stenosis at this level has mildly worsened from previous. 2. No other acute traumatic injury within the thoracic spine. 3. Additional fairly mild multilevel degenerative spondylolysis as above without significant spinal stenosis. Moderate bilateral foraminal narrowing within the upper thoracic spine related to facet hypertrophy as above. MR LUMBAR SPINE IMPRESSION 1. Overall stable appearance of the lumbar spine with no other acute traumatic injury or other finding identified. 2. Stable multifactorial degenerative changes with resultant moderate canal and lateral recess stenosis at L3-4. 3. Left eccentric disc osteophyte at L4-5 with resultant moderate left lateral recess stenosis, stable. 4. Multifactorial degenerative changes with resultant multilevel foraminal narrowing as above, moderate on the left at L4 and bilaterally at L5. Current attempt being made to contact the ordering provider. Results will be communicated as soon as possible. Electronically Signed   By: Jeannine Boga M.D.   On: 11/28/2017 23:00   Mr Lumbar Spine Wo Contrast  Result Date: 11/28/2017 CLINICAL DATA:  Initial evaluation for acute/progressive myelopathy, back pain. EXAM: MRI THORACIC AND LUMBAR SPINE WITHOUT CONTRAST TECHNIQUE: Multiplanar and multiecho pulse sequences of the thoracic and lumbar spine were obtained without intravenous contrast. COMPARISON:  Prior radiograph from earlier the same day as well as previous MRI from 09/29/2016. FINDINGS: MRI THORACIC SPINE FINDINGS Alignment: Vertebral bodies normally aligned with preservation of the normal thoracic kyphosis. No listhesis. Vertebrae: The vertebral body heights extending from T1 through T11 are maintained without evidence for acute or chronic fracture. T12-L1 discussed below on lumbar spine portion of this exam. Underlying bone marrow  signal intensity mildly heterogeneous but within normal limits. Small benign hemangiomas noted within the T10 vertebral body. No worrisome osseous lesions. Abnormal marrow edema about the T12-L1 interspace, discussed below. Cord: Signal intensity within the thoracic spinal cord within normal limits. Paraspinal and other soft tissues: Soft tissue edema about the T12-L1 interspace, discussed below. Paraspinous soft tissues demonstrate no other acute abnormality. 16 mm well-circumscribed cystic lesion within the subcutaneous fat of the left mid back likely reflects a sebaceous cyst. Trace bilateral pleural effusions noted. Few T2 hyperintense cyst noted within the visualized kidneys. Disc levels: T1-2: Mild diffuse disc bulge. Moderate facet hypertrophy, greater on the left. No significant spinal stenosis. Moderate bilateral neural foraminal narrowing. T2-3: Moderate facet hypertrophy with resultant mild to moderate bilateral foraminal narrowing. No spinal stenosis. T3-4:  Unremarkable. T4-5:  Mild facet hypertrophy.  No stenosis. T5-6:  Unremarkable. T6-7:  Mild diffuse disc bulge.  No significant stenosis. T7-8:  Unremarkable. T8-9: Minimal disc bulge. Mild facet hypertrophy. No significant stenosis. T9-10: Diffuse disc bulge. Posterior element hypertrophy. No significant stenosis. T10-11: Mild disc bulge. Posterior element hypertrophy. No significant stenosis. T11-12:  Mild facet hypertrophy.  No stenosis. MRI LUMBAR SPINE FINDINGS Segmentation: Normal segmentation. Lowest well-formed disc labeled the L5-S1 level. Same numbering system employed as on previous exams. Alignment: Straightening with slight reversal of the normal  lumbar lordosis, stable. No interval malalignment. Vertebrae: There is abnormal edema involving the inferior endplate of J09, and to a lesser extent the adjacent superior endplate of L1. Edema extends into the right greater than left posterior elements. Associated edema within the intervening  T12-L1 interspace. Prominent soft tissue edema present within the right greater than left posterior and psoas musculature bilaterally. No discrete collections identified. While these findings are nonspecific, and may in part be degenerative in nature, sequelae of possible traumatic injury could be considered given the history of recent fall. There is question of a possible fracture through the inferior endplate of T26 and/or Z12-W5 disc, although no definite discrete fracture line evident by MRI. Degenerative ask you disc phenomena within the T12-L1 disc space felt to make infection unlikely. Vertebral body heights otherwise maintained without evidence for acute or chronic fracture. Underlying bone marrow signal intensity heterogeneous but within normal limits. No worrisome osseous lesions. Conus medullaris and cauda equina: Conus extends to the T12-L1 level. Conus and cauda equina appear normal. Paraspinal and other soft tissues: Paraspinous edema adjacent to the T12-L1 interspace as above, right greater than left. Paraspinous soft tissues otherwise within normal limits. Scattered renal cysts noted bilaterally. Disc levels: T12-L1: Disc bulge with disc desiccation and intervertebral disc space narrowing. Slight asymmetric widening of the anterior T12-L1 disc space. Edema within the adjacent inferior T12 vertebral body, raising the possibility for possible occult endplate fracture. Edema present within the intervening T12-L1 disc space as well as the superior aspect of L1. Extension into the posterior elements, greater on the right. Superimposed moderate facet hypertrophy. 13 mm synovial cyst at the posterior aspect of the left T12-L1 facet. Probable additional tiny 5 mm synovial cyst at the anterior margin of the left T12-L1 facet (series 25, image 9). Resultant moderate to severe spinal stenosis, progressed from previous. Moderate bilateral foraminal narrowing. L1-2: Diffuse disc bulge with mild reactive endplate  changes. No significant spinal stenosis. Mild left L1 foraminal narrowing. L2-3: Mild disc bulge with disc desiccation and intervertebral disc space narrowing. No significant spinal stenosis. No significant foraminal encroachment. L3-4: Chronic severe intervertebral disc space narrowing with diffuse disc bulge and disc desiccation. Mild reactive endplate changes with marginal endplate osteophytic spurring. Mild facet hypertrophy. Resultant moderate canal and bilateral subarticular stenosis. Mild to moderate bilateral L3 foraminal narrowing, right worse than left. L4-5: Diffuse disc bulge with disc desiccation and intervertebral disc space narrowing. Facet and ligamentum flavum hypertrophy. Epidural lipomatosis. Moderate left lateral recess narrowing, stable from previous. Moderate left with mild right L4 foraminal narrowing, also stable. L5-S1: Chronic intervertebral disc space narrowing with diffuse disc bulge and disc desiccation. Chronic reactive endplate changes with marginal endplate osteophytic spurring. Superimposed right paracentral disc extrusion with superior migration, stable. Epidural lipomatosis. Bilateral facet hypertrophy. Resultant moderate to advanced bilateral L5 foraminal narrowing with moderate bilateral lateral recess stenosis, stable. IMPRESSION: MR THORACIC SPINE IMPRESSION 1. Abnormal marrow edema involving the T12 and L1 vertebral bodies, with extension into the right greater than left posterior elements. While these findings are likely at least partly degenerative in nature, appearance and history raises the possibility for superimposed acute and/or recent traumatic injury, with possible fracture through the inferior endplate of Y09 and/or X83-J8 disc space. Further assessment with dedicated CT recommended for further evaluation. Resultant moderate to severe spinal stenosis at this level has mildly worsened from previous. 2. No other acute traumatic injury within the thoracic spine. 3.  Additional fairly mild multilevel degenerative spondylolysis as above without significant spinal stenosis.  Moderate bilateral foraminal narrowing within the upper thoracic spine related to facet hypertrophy as above. MR LUMBAR SPINE IMPRESSION 1. Overall stable appearance of the lumbar spine with no other acute traumatic injury or other finding identified. 2. Stable multifactorial degenerative changes with resultant moderate canal and lateral recess stenosis at L3-4. 3. Left eccentric disc osteophyte at L4-5 with resultant moderate left lateral recess stenosis, stable. 4. Multifactorial degenerative changes with resultant multilevel foraminal narrowing as above, moderate on the left at L4 and bilaterally at L5. Current attempt being made to contact the ordering provider. Results will be communicated as soon as possible. Electronically Signed   By: Jeannine Boga M.D.   On: 11/28/2017 23:00   Dg Hip Unilat W Or Wo Pelvis 2-3 Views Left  Result Date: 11/28/2017 CLINICAL DATA:  Acute LEFT hip pain following fall today. Initial encounter. EXAM: DG HIP (WITH OR WITHOUT PELVIS) 2-3V LEFT COMPARISON:  03/20/2012 abdomen/pelvis CT. FINDINGS: No acute fracture, subluxation or dislocation noted. Mild degenerative changes in both hips noted. Mild to moderate degenerative changes in the LOWER lumbar spine again identified. No suspicious focal bony lesions noted. IMPRESSION: 1. No acute abnormality 2. 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 Electronically Signed   By: Margarette Canada M.D.   On: 11/28/2017 13:39     CBC Recent Labs  Lab 11/28/17 1355 11/29/17 0436 11/30/17 0438  WBC 15.1* 13.1* 12.3*  HGB 13.2 12.0* 11.8*  HCT 42.9 39.2 39.6  PLT 369 359 382  MCV 79.4* 78.7* 78.9*  MCH 24.4* 24.1* 23.5*  MCHC 30.8 30.6 29.8*  RDW 17.0* 17.0* 17.1*  LYMPHSABS 3.4  --   --   MONOABS 1.5*  --   --   EOSABS 0.4  --   --   BASOSABS 0.1  --   --     Chemistries  Recent Labs  Lab  11/28/17 1355 11/29/17 0436 11/30/17 0438  NA 140 138 138  K 2.6* 3.4* 4.1  CL 102 104 107  CO2 27 26 26   GLUCOSE 115* 127* 100*  BUN 22 26* 24*  CREATININE 1.79* 1.91* 1.63*  CALCIUM 8.9 8.2* 8.6*  MG  --  2.2  --    ------------------------------------------------------------------------------------------------------------------ No results for input(s): CHOL, HDL, LDLCALC, TRIG, CHOLHDL, LDLDIRECT in the last 72 hours.  Lab Results  Component Value Date   HGBA1C 7.5 (H) 11/28/2017   ------------------------------------------------------------------------------------------------------------------ No results for input(s): TSH, T4TOTAL, T3FREE, THYROIDAB in the last 72 hours.  Invalid input(s): FREET3 ------------------------------------------------------------------------------------------------------------------ No results for input(s): VITAMINB12, FOLATE, FERRITIN, TIBC, IRON, RETICCTPCT in the last 72 hours.  Coagulation profile Recent Labs  Lab 11/28/17 2040 11/29/17 0436 11/30/17 0438  INR 2.55 2.39 2.41    No results for input(s): DDIMER in the last 72 hours.  Cardiac Enzymes No results for input(s): CKMB, TROPONINI, MYOGLOBIN in the last 168 hours.  Invalid input(s): CK ------------------------------------------------------------------------------------------------------------------    Component Value Date/Time   BNP 35.0 07/07/2017 0750   BNP 23.5 06/09/2015 0918     [name] M.D on 11/30/2017 at 6:22 PM  Pager---[phone] Go to [email or web address] - password [email or web address] for contact info  [facility] - Office  [phone]

## 2017-11-30 NOTE — Progress Notes (Signed)
ANTICOAGULATION CONSULT NOTE - Follow Up Consult  Pharmacy Consult for Coumadin Indication: hx recurrent VTE  No Known Allergies  Patient Measurements: Height: 5\' 10"  (177.8 cm) Weight: 207 lb (93.9 kg) IBW/kg (Calculated) : 73  Vital Signs: Temp: 98.3 F (36.8 C) (11/06 0532) Temp Source: Oral (11/06 0532) BP: 159/95 (11/06 0532) Pulse Rate: 90 (11/06 0532)  Labs: Recent Labs    11/28/17 1355 11/28/17 2040 11/29/17 0436 11/30/17 0438  HGB 13.2  --  12.0* 11.8*  HCT 42.9  --  39.2 39.6  PLT 369  --  359 382  LABPROT  --  27.0* 25.8* 25.9*  INR  --  2.55 2.39 2.41  CREATININE 1.79*  --  1.91* 1.63*    Estimated Creatinine Clearance: 44.4 mL/min (A) (by C-G formula based on SCr of 1.63 mg/dL (H)).  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 Pharmacy has been consulted for warfarin dosing.     INR remains therapeutic (2.41)  Goal of Therapy:  INR 2-3 Monitor platelets by anticoagulation protocol: Yes   Plan:   Continue Coumadin 3 mg daily except 1.5 mg on Sundays.  Change daily PT/INR to MWF. Next 11/8.  [name], [place] Pager: [phone] or phone: [phone] 11/30/2017,11:53 AM

## 2017-11-30 NOTE — Progress Notes (Signed)
Physical Therapy Treatment Patient Details Name: [name] MRN: [id number] DOB: [birth year] Today's Date: 11/30/2017    History of Present Illness Pt is a 77 y/o male admitted secondary to worsening back pain and generalized weakness. Lumbar spine imaging revealed degenerative changes. Pt also found to have hypokalemia. PMH includes dementia. MRI: moderate to severe spinal stenosis T12-L1    PT Comments    Pt awake upon entry, amicable, reports 6/10 pain, voices need to evacuate bowel then assisted to [facility] as requested. Pt remain seated with supervision on BSC x4 minutes without passage of bowel, then requested return to bed d/t pain. Pt tolerating bed/mobility and transfers somewhat better this date, but continues to require heavy physical assistance. Back pain well controlled while still, but sharp increases in pain when moving. Pt demonstrates some intermittent odd behavior at times (offered toilet tissue to dight his rectum, which he immediately used to polish his table instead), questionable for mild cognitive impairment, but he is able to attend to task with exercise and count repetitions as cued.      Follow Up Recommendations  SNF;Supervision/Assistance - 24 hour     Equipment Recommendations  None recommended by PT    Recommendations for Other Services       Precautions / Restrictions Precautions Precautions: Fall;Back Precaution Comments: Pt reports falls at home. Reviewed back precautions to help with pain management (pt did remember back precautions from yesterday) Restrictions Weight Bearing Restrictions: No    Mobility  Bed Mobility Overal bed mobility: Needs Assistance Bed Mobility: Rolling;Sidelying to Sit Rolling: Min assist Sidelying to sit: Max assist     Sit to sidelying: Max assist(Pt given VC 2 times with gestural cues, and still not following commands for log roll. ) General bed mobility comments: Increased time, heavy antalgia, guarding, and mild  anxiety   Transfers Overall transfer level: Needs assistance Equipment used: None Transfers: Sit to/from [facility] Sit to Stand: Min assist Stand pivot transfers: Mod assist       General transfer comment: Heavy VC for safe technique, mildly impulsive d/t low confidence, impaired endurance, and giving out.   Ambulation/Gait Ambulation/Gait assistance: (unable at this time)               Marine scientist Rankin (Stroke Patients Only)       Balance     Sitting balance-Leahy Scale: Zero     Standing balance support: Bilateral upper extremity supported;During functional activity                                Cognition Arousal/Alertness: Awake/alert Behavior During Therapy: WFL for tasks assessed/performed Overall Cognitive Status: Within Functional Limits for tasks assessed                                        Exercises General Exercises - Lower Extremity Ankle Circles/Pumps: AROM;10 reps;Supine Short Arc Quad: AROM;10 reps;AAROM;Both;Supine Heel Slides: AAROM;10 reps;Supine;Both    General Comments        Pertinent Vitals/Pain Pain Assessment: 0-10 Pain Score: 6  Pain Location: back  Pain Descriptors / Indicators: Guarding;Grimacing Pain Intervention(s): Limited activity within patient's tolerance;Premedicated before session;Monitored during session    Home Living  Prior Function            PT Goals (current goals can now be found in the care plan section) Acute Rehab PT Goals Patient Stated Goal: to get OOB PT Goal Formulation: With patient Time For Goal Achievement: 12/12/17 Potential to Achieve Goals: Fair Progress towards PT goals: Progressing toward goals    Frequency    Min 2X/week      PT Plan Current plan remains appropriate    Co-evaluation              AM-PAC PT "6 Clicks" Daily Activity   Outcome Measure  Difficulty turning over in bed (including adjusting bedclothes, sheets and blankets)?: Unable Difficulty moving from lying on back to sitting on the side of the bed? : Unable Difficulty sitting down on and standing up from a chair with arms (e.g., wheelchair, bedside commode, etc,.)?: Unable Help needed moving to and from a bed to chair (including a wheelchair)?: Total Help needed walking in hospital room?: Total Help needed climbing 3-5 steps with a railing? : Total 6 Click Score: 6    End of Session   Activity Tolerance: Patient tolerated treatment well Patient left: in bed;with call bell/phone within reach(pt refuses OOB to chair, very uncomfortable in sitting) Nurse Communication: Mobility status PT Visit Diagnosis: History of falling (Z91.81);Muscle weakness (generalized) (M62.81);Repeated falls (R29.6);Unsteadiness on feet (R26.81);Pain Pain - part of body: (back)     Time: 5830-9407 PT Time Calculation (min) (ACUTE ONLY): 23 min  Charges:  $Therapeutic Exercise: 8-22 mins $Therapeutic Activity: 8-22 mins                     3:51 PM, 11/30/17 [name], PT, DPT Physical Therapist - [place] [phone] (Pager)  [phone] (Office)    [name] 11/30/2017, 3:47 PM

## 2017-11-30 NOTE — Progress Notes (Signed)
Per Dr. Denton Brick tele order does not need renewal.

## 2017-11-30 NOTE — Progress Notes (Signed)
PT Cancellation Note  Patient Details Name: [name] MRN: [id number] DOB: 24-Sep-1940   Cancelled Treatment:    Reason Eval/Treat Not Completed: Other (comment)(Refused due to pain. )   [name] 11/30/2017, 12:54 PM  Viktor Philipp,PT Acute Rehabilitation Services Pager:  [phone]  Office:  [place] Pager:  [phone]  Office:  [phone]

## 2017-11-30 NOTE — Clinical Social Work Note (Addendum)
Received call from patient's wife. Provided update. Insurance authorization still pending.  Dayton Scrape, [place]  11:53 am Auth still pending.  Dayton Scrape, [place] [phone]  2:29 pm Received call from SNF. Insurance company wants more specifics on what patient can and can't do, prior level of function, etc. Left voicemail for acute rehab dept.  Dayton Scrape, [place] [phone]  2:45 pm Acute rehab dept will send out page for PT and OT to see patient.  Dayton Scrape, [place] [phone]  4:08 pm SNF sent updated PT note to [facility].  Dayton Scrape, [place]

## 2017-11-30 NOTE — Discharge Instructions (Addendum)
1)Follow-up with neurosurgeon Dr Earleen Newport in 2 to 3 weeks 2)Physical therapy and activity limits as advised by neurosurgeon Dr Earleen Newport 3) repeat INR on Monday, 12/05/2017 4) you are already taking Coumadin for blood thinner, so Avoid ibuprofen/Advil/Aleve/Motrin/Goody Powders/Naproxen/BC powders/Meloxicam/Diclofenac/Indomethacin and other Nonsteroidal anti-inflammatory medications as these will make you more likely to bleed and can cause stomach ulcers, can also cause Kidney problems.    Information on my medicine - Coumadin   (Warfarin)  Why was Coumadin prescribed for you? Coumadin was prescribed for you because you have a blood clot or a medical condition that can cause an increased risk of forming blood clots. Blood clots can cause serious health problems by blocking the flow of blood to the heart, lung, or brain. Coumadin can prevent harmful blood clots from forming. As a reminder your indication for Coumadin is:   Select from menu previous blood clots in lungs and legs  What test will check on my response to Coumadin? While on Coumadin (warfarin) you will need to have an INR test regularly to ensure that your dose is keeping you in the desired range. The INR (international normalized ratio) number is calculated from the result of the laboratory test called prothrombin time (PT).  If an INR APPOINTMENT HAS NOT ALREADY BEEN MADE FOR YOU please schedule an appointment to have this lab work done by your health care provider within 7 days. Your INR goal is usually a number between:  2 to 3 or your provider may give you a more narrow range like 2-2.5.  Ask your health care provider during an office visit what your goal INR is.  What  do you need to  know  About  COUMADIN? Take Coumadin (warfarin) exactly as prescribed by your healthcare provider about the same time each day.  DO NOT stop taking without talking to the doctor who prescribed the medication.  Stopping without other blood  clot prevention medication to take the place of Coumadin may increase your risk of developing a new clot or stroke.  Get refills before you run out.  What do you do if you miss a dose? If you miss a dose, take it as soon as you remember on the same day then continue your regularly scheduled regimen the next day.  Do not take two doses of Coumadin at the same time.  Important Safety Information A possible side effect of Coumadin (Warfarin) is an increased risk of bleeding. You should call your healthcare provider right away if you experience any of the following: ? Bleeding from an injury or your nose that does not stop. ? Unusual colored urine (red or dark brown) or unusual colored stools (red or black). ? Unusual bruising for unknown reasons. ? A serious fall or if you hit your head (even if there is no bleeding).  Some foods or medicines interact with Coumadin (warfarin) and might alter your response to warfarin. To help avoid this: ? Eat a balanced diet, maintaining a consistent amount of Vitamin K. ? Notify your provider about major diet changes you plan to make. ? Avoid alcohol or limit your intake to 1 drink for women and 2 drinks for men per day. (1 drink is 5 oz. wine, 12 oz. beer, or 1.5 oz. liquor.)  Make sure that ANY health care provider who prescribes medication for you knows that you are taking Coumadin (warfarin).  Also make sure the healthcare provider who is monitoring your Coumadin knows when you have started  a new medication including herbals and non-prescription products.  Coumadin (Warfarin)  Major Drug Interactions  Increased Warfarin Effect Decreased Warfarin Effect  Alcohol (large quantities) Antibiotics (esp. Septra/Bactrim, Flagyl, Cipro) Amiodarone (Cordarone) Aspirin (ASA) Cimetidine (Tagamet) Megestrol (Megace) NSAIDs (ibuprofen, naproxen, etc.) Piroxicam (Feldene) Propafenone (Rythmol SR) Propranolol (Inderal) Isoniazid (INH) Posaconazole (Noxafil)  Barbiturates (Phenobarbital) Carbamazepine (Tegretol) Chlordiazepoxide (Librium) Cholestyramine (Questran) Griseofulvin Oral Contraceptives Rifampin Sucralfate (Carafate) Vitamin K   Coumadin (Warfarin) Major Herbal Interactions  Increased Warfarin Effect Decreased Warfarin Effect  Garlic Ginseng Ginkgo biloba Coenzyme Q10 Green tea St. Johns wort    Coumadin (Warfarin) FOOD Interactions  Eat a consistent number of servings per week of foods HIGH in Vitamin K (1 serving =  cup)  Collards (cooked, or boiled & drained) Kale (cooked, or boiled & drained) Mustard greens (cooked, or boiled & drained) Parsley *serving size only =  cup Spinach (cooked, or boiled & drained) Swiss chard (cooked, or boiled & drained) Turnip greens (cooked, or boiled & drained)  Eat a consistent number of servings per week of foods MEDIUM-HIGH in Vitamin K (1 serving = 1 cup)  Asparagus (cooked, or boiled & drained) Broccoli (cooked, boiled & drained, or raw & chopped) Brussel sprouts (cooked, or boiled & drained) *serving size only =  cup Lettuce, raw (green leaf, endive, romaine) Spinach, raw Turnip greens, raw & chopped   These websites have more information on Coumadin (warfarin):  FailFactory.se; VeganReport.com.au;   1)Follow-up with neurosurgeon Dr Earleen Newport in 2 to 3 weeks 2)Physical therapy and activity limits as advised by neurosurgeon Dr Blanchie Dessert [place] 3) repeat INR on Monday, 12/05/2017 4) you are already taking Coumadin for blood thinner, so Avoid ibuprofen/Advil/Aleve/Motrin/Goody Powders/Naproxen/BC powders/Meloxicam/Diclofenac/Indomethacin and other Nonsteroidal anti-inflammatory medications as these will make you more likely to bleed and can cause stomach ulcers, can also cause Kidney problems.

## 2017-12-01 DIAGNOSIS — W19XXXA Unspecified fall, initial encounter: Secondary | ICD-10-CM | POA: Diagnosis not present

## 2017-12-01 DIAGNOSIS — Z515 Encounter for palliative care: Secondary | ICD-10-CM | POA: Diagnosis not present

## 2017-12-01 DIAGNOSIS — G8929 Other chronic pain: Secondary | ICD-10-CM | POA: Diagnosis not present

## 2017-12-01 DIAGNOSIS — Z9861 Coronary angioplasty status: Secondary | ICD-10-CM | POA: Diagnosis not present

## 2017-12-01 DIAGNOSIS — J449 Chronic obstructive pulmonary disease, unspecified: Secondary | ICD-10-CM | POA: Diagnosis not present

## 2017-12-01 DIAGNOSIS — I130 Hypertensive heart and chronic kidney disease with heart failure and stage 1 through stage 4 chronic kidney disease, or unspecified chronic kidney disease: Secondary | ICD-10-CM | POA: Diagnosis not present

## 2017-12-01 DIAGNOSIS — R0902 Hypoxemia: Secondary | ICD-10-CM | POA: Diagnosis not present

## 2017-12-01 DIAGNOSIS — E291 Testicular hypofunction: Secondary | ICD-10-CM | POA: Diagnosis not present

## 2017-12-01 DIAGNOSIS — E119 Type 2 diabetes mellitus without complications: Secondary | ICD-10-CM | POA: Diagnosis not present

## 2017-12-01 DIAGNOSIS — Z743 Need for continuous supervision: Secondary | ICD-10-CM | POA: Diagnosis not present

## 2017-12-01 DIAGNOSIS — F321 Major depressive disorder, single episode, moderate: Secondary | ICD-10-CM | POA: Diagnosis not present

## 2017-12-01 DIAGNOSIS — D631 Anemia in chronic kidney disease: Secondary | ICD-10-CM | POA: Diagnosis not present

## 2017-12-01 DIAGNOSIS — M545 Low back pain: Secondary | ICD-10-CM | POA: Diagnosis not present

## 2017-12-01 DIAGNOSIS — R41841 Cognitive communication deficit: Secondary | ICD-10-CM | POA: Diagnosis not present

## 2017-12-01 DIAGNOSIS — B1920 Unspecified viral hepatitis C without hepatic coma: Secondary | ICD-10-CM | POA: Diagnosis not present

## 2017-12-01 DIAGNOSIS — M546 Pain in thoracic spine: Secondary | ICD-10-CM | POA: Diagnosis not present

## 2017-12-01 DIAGNOSIS — R279 Unspecified lack of coordination: Secondary | ICD-10-CM | POA: Diagnosis not present

## 2017-12-01 DIAGNOSIS — I10 Essential (primary) hypertension: Secondary | ICD-10-CM | POA: Diagnosis not present

## 2017-12-01 DIAGNOSIS — M5440 Lumbago with sciatica, unspecified side: Secondary | ICD-10-CM | POA: Diagnosis not present

## 2017-12-01 DIAGNOSIS — N183 Chronic kidney disease, stage 3 (moderate): Secondary | ICD-10-CM | POA: Diagnosis not present

## 2017-12-01 DIAGNOSIS — E876 Hypokalemia: Secondary | ICD-10-CM | POA: Diagnosis not present

## 2017-12-01 DIAGNOSIS — E039 Hypothyroidism, unspecified: Secondary | ICD-10-CM | POA: Diagnosis not present

## 2017-12-01 DIAGNOSIS — M6281 Muscle weakness (generalized): Secondary | ICD-10-CM | POA: Diagnosis not present

## 2017-12-01 DIAGNOSIS — I5032 Chronic diastolic (congestive) heart failure: Secondary | ICD-10-CM | POA: Diagnosis not present

## 2017-12-01 DIAGNOSIS — W19XXXD Unspecified fall, subsequent encounter: Secondary | ICD-10-CM | POA: Diagnosis not present

## 2017-12-01 DIAGNOSIS — E1122 Type 2 diabetes mellitus with diabetic chronic kidney disease: Secondary | ICD-10-CM | POA: Diagnosis not present

## 2017-12-01 DIAGNOSIS — R5381 Other malaise: Secondary | ICD-10-CM | POA: Diagnosis not present

## 2017-12-01 DIAGNOSIS — I2510 Atherosclerotic heart disease of native coronary artery without angina pectoris: Secondary | ICD-10-CM | POA: Diagnosis not present

## 2017-12-01 LAB — GLUCOSE, CAPILLARY
Glucose-Capillary: 88 mg/dL (ref 70–99)
Glucose-Capillary: 112 mg/dL — ABNORMAL HIGH (ref 70–99)
Glucose-Capillary: 112 mg/dL — ABNORMAL HIGH (ref 70–99)

## 2017-12-01 MED ORDER — MEGESTROL ACETATE 400 MG/10ML PO SUSP: 400.0000 mg | Freq: Every day | ORAL | 2 refills | Status: DC

## 2017-12-01 MED ORDER — FUROSEMIDE 20 MG PO TABS: 20.0000 mg | ORAL_TABLET | ORAL | 11 refills | Status: AC

## 2017-12-01 MED ORDER — LIDOCAINE 5 % EX PTCH: 1.0000 | MEDICATED_PATCH | CUTANEOUS | 0 refills | Status: DC

## 2017-12-01 MED ORDER — SENNOSIDES-DOCUSATE SODIUM 8.6-50 MG PO TABS: 2.0000 | ORAL_TABLET | Freq: Two times a day (BID) | ORAL | 1 refills | Status: DC

## 2017-12-01 MED ORDER — METHOCARBAMOL 500 MG PO TABS: 500.0000 mg | ORAL_TABLET | Freq: Three times a day (TID) | ORAL | 1 refills | Status: DC

## 2017-12-01 MED ORDER — OXYCODONE HCL 5 MG PO TABS: 5.0000 mg | ORAL_TABLET | ORAL | 0 refills | Status: DC | PRN

## 2017-12-01 MED ORDER — ONDANSETRON HCL 4 MG PO TABS: 4.0000 mg | ORAL_TABLET | Freq: Four times a day (QID) | ORAL | 0 refills | Status: DC | PRN

## 2017-12-01 MED ORDER — INSULIN GLARGINE 100 UNIT/ML SOLOSTAR PEN: 26.0000 [IU] | PEN_INJECTOR | Freq: Every day | SUBCUTANEOUS | 1 refills | Status: AC

## 2017-12-01 NOTE — Consult Note (Signed)
   [facility] CM Inpatient Consult   12/01/2017  [name] Sep 14, 1940 [id number]  Patient assessed for extreme high risk for unplanned readmissions.  Patient with [facility] plan. Chart review reveal patient is for skilled nursing facility for short term rehab.  Came by to check for community follow up needs and patient has no support family at bedside. Staff states patient to go to a skilled facility today and wife was not feeling well with a cold. LCSW notes shows patient to go to [facility]. Will out reach for needs.   For questions contact:   Natividad Brood, RN BSN [facility] Liaison  [phone] business mobile phone Toll free office [phone]

## 2017-12-01 NOTE — Discharge Summary (Signed)
[name], is a 77 y.o. male  DOB 1940-11-29  MRN [id number].  Admission date:  11/28/2017  Admitting Physician  Dennis Bongo, MD  Discharge Date:  12/01/2017   Primary MD  Dennis Cruel, MD  Recommendations for primary care physician for things to follow:   1)Follow-up with neurosurgeon Dr [name] in 2 to 3 weeks 2)Physical therapy and activity limits as advised by neurosurgeon Dr [name] 3) repeat INR on Monday, 12/05/2017 4) you are already taking Coumadin for blood thinner, so Avoid ibuprofen/Advil/Aleve/Motrin/Goody Powders/Naproxen/BC powders/Meloxicam/Diclofenac/Indomethacin and other Nonsteroidal anti-inflammatory medications as these will make you more likely to bleed and can cause stomach ulcers, can also cause Kidney problems.   Admission Diagnosis  Hypokalemia [E87.6] Muscle weakness [M62.81] Fall, initial encounter [W19.XXXA]   Discharge Diagnosis  Hypokalemia [E87.6] Muscle weakness [M62.81] Fall, initial encounter [W19.XXXA]    Principal Problem:   Back pain Active Problems:   Hypothyroidism   Hypogonadism male   CKD (chronic kidney disease) stage 3, GFR 30-59 ml/min (HCC)   Addison disease (HCC)   Benign essential HTN   Chronic diastolic CHF (congestive heart failure) (HCC)   Diabetes mellitus without complication (HCC)   Dementia (HCC)   Hypokalemia      Past Medical History:  Diagnosis Date  . Acute lower GI bleeding    a. admitted to [facility] 04/11/2013;  b. 04/2014 EGD: 1.  gastric erosions, mild prox gastritis and bulbar duodenitis->Protonix.  . Addison's disease ([place])   . Anemia   . Anemia in CKD (chronic kidney disease) 06/25/2015  . Chronic back pain   . Chronic diastolic CHF (congestive heart failure) ([place])    a. His initial ejection fraction was between 30 and 35%;  b. 04/2013 Echo: EF 50-55% Gr1 DD, mild-mod MR;  c. 04/2014 Echo: EF 60-65%, Gr  1 DD, triv TR.  . CKD (chronic kidney disease), stage III ([place])   . Coronary artery disease    a. 07/2014 - s/p difficult PCI - had pressure wire analysis of LAD s/p DES in mid segment c/b wire-induced dissection in the distal segment of the LAD which was treated with repeated balloon inflations. Also had DES to rPDA - again had either spasm distal to the stent placement or an edge dissection but the vessel was small in that area; procedure aborted.  . Daily headache   . GERD (gastroesophageal reflux disease)   . H/O hiatal hernia   . Hepatitis    a. 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  . History of aortic insufficiency   . Hx of cardiovascular stress test    a. ETT-Myoview 7/14:  Low risk, inferior defect consistent with thinning, no ischemia, normal wall motion, EF 54%  . Hypertension   . Hypogonadism male   . Obesity   . Peptic ulcer disease   . Pulmonary embolism ([place])    a. 04/2014 CTA: Bilat submassive PE ->coumadin.  . Pulmonary HTN ([place])   . Thyroid disease     Past Surgical History:  Procedure Laterality Date  . APPENDECTOMY  1950's  . CARDIAC CATHETERIZATION  06/26/2008   EF 30-35%  . CARDIAC CATHETERIZATION N/A 08/20/2014   Procedure: Right/Left Heart Cath and Coronary Angiography;  Surgeon: Dennis Artist, MD;  Location: [place] [facility];  Service: Cardiovascular;  Laterality: N/A;  . CARDIAC CATHETERIZATION N/A 08/21/2014   Procedure: Coronary Stent Intervention;  Surgeon: Dennis Hampshire, MD;  Location: [place] [facility];  Service: Cardiovascular;  Laterality: N/A;  . CARDIAC CATHETERIZATION N/A 11/24/2015   Procedure: Right/Left Heart Cath and Coronary Angiography;  Surgeon: Dennis Artist, MD;  Location: [place] [facility];  Service: Cardiovascular;  Laterality: N/A;  . CHOLECYSTECTOMY  ~ 1965  . ESOPHAGOGASTRODUODENOSCOPY N/A 04/11/2013   Procedure: ESOPHAGOGASTRODUODENOSCOPY (EGD);  Surgeon: Dennis Sabins, MD;  Location: [facility] ENDOSCOPY;  Service: Endoscopy;  Laterality: N/A;  .  ESOPHAGOGASTRODUODENOSCOPY N/A 05/13/2014   Procedure: ESOPHAGOGASTRODUODENOSCOPY (EGD);  Surgeon: Dennis Silence, MD;  Location: [facility] ENDOSCOPY;  Service: Endoscopy;  Laterality: N/A;  . TONSILLECTOMY  1940's  . US ECHOCARDIOGRAPHY  04/09/2010   EF 60-65%       HPI  from the history and physical done on the day of admission:    Patient coming from: Home - lives with wife; KAJ:GOTL  Chief Complaint: back pain  XBW:IOMBTD Dennis Hamiltonis a 77 y.o.malewith medical history significant ofhypothyroidism; submassive PE (2016); HTN; CAD s/p stent; stage 3 CKD; dementia; and Addison's disease presenting with back pain."I been having some issues. I fell, actually. I just kind of crumbled like a wet noodle and hit the floor." He is having pain in his low back, but this is not new. Denies LE symptoms. Denies incontinence. Currently reports chronic back pain and "tired of lying in the dang pain."   ED Course:  K+ 2.6, overall weakness.  Deconditioning over a long period of time with LE weakness.  Also with urinary/fecal incontinence.  Wife is unable to take to the doctor.  SW/CM are working on getting rehab after d/c.    Review of Systems: As per HPI; otherwise review of systems reviewed and negative.  This is suspect given his baseline dementia - although A&O x 3 at time of admission.   Hospital Course:     Brief Summary:- 77 y.o.malewith medical history significant ofhypothyroidism; submassive PE (2016); HTN; CAD s/p stent; stage 3 CKD; dementia; and Addison's disease admitted on 11/28/2017 with worsening back pain and fecal and urinary incontinence   Plan:- 1) worsening chronic back pain--due to Severe spondylosis and stenosis at T12-L1 with an acute inflammatory process at that level----neurosurgeon recommends rehab and surgical intervention only if patient fails rehab in the future at which time T12-L1 level decompression may be considered to help manage his pain however it  may not have patient continues as this has been present for a while now... PT eval appreciated, continue physical therapy and pain management  At SNF    2)CKD III--- stable, avoid nephrotoxic agents  3) Addison's disease----table, continue home dose hydrocortisone  4)HFpEF--history of chronic diastolic dysfunction CHF with last known EF of 50 to 55%, no acute exacerbation at this time, clinically patient appears compensated and euvolemic, change Lasix to Monday Wednesday Friday due to decreased oral intake  to avoid dehydration risk, continue Coreg  5)H/o recurrent VTE---  INR has been therapeutic, continue Coumadin  ,  repeat INR on Monday, 12/05/2017  6)DM2--- last A1c 7.5, overall fair control, c/n Lantus and Use Novolog/Humalog Sliding scale insulin with Accu-Cheks/Fingersticks as  ordered   7)Hypothyroidism--- recent TSH 2.2, continue Armour Thyroid  8)Dementia----progressive, patient requiring more and more help/care at home than wife can provide   Code Status : full  Disposition Plan  :  continue physical therapy and pain management  At SNF   Consults  : NeuroSurgery  Discharge Condition: stable  Follow UP  Contact information for after-discharge care    Destination    [facility] Preferred SNF .   Service:  Skilled Nursing Contact information: [address] [place] obtained -   Diet and Activity recommendation:  As advised  Discharge Instructions    Discharge Instructions    Call MD for:  difficulty breathing, headache or visual disturbances   Complete by:  As directed    Call MD for:  persistant dizziness or light-headedness   Complete by:  As directed    Call MD for:  persistant nausea and vomiting   Complete by:  As directed    Call MD for:  severe uncontrolled pain   Complete by:  As directed    Call MD for:  temperature >100.4   Complete by:  As directed    Diet  - low sodium heart healthy   Complete by:  As directed    Discharge instructions   Complete by:  As directed    1)Follow-up with neurosurgeon Dr [name] in 2 to 3 weeks 2)Physical therapy and activity limits as advised by neurosurgeon Dr [name] 3) repeat INR on Monday, 12/05/2017 4) you are already taking Coumadin for blood thinner, so Avoid ibuprofen/Advil/Aleve/Motrin/Goody Powders/Naproxen/BC powders/Meloxicam/Diclofenac/Indomethacin and other Nonsteroidal anti-inflammatory medications as these will make you more likely to bleed and can cause stomach ulcers, can also cause Kidney problems.   Increase activity slowly   Complete by:  As directed         Discharge Medications     Allergies as of 12/01/2017   No Known Allergies     Medication List    STOP taking these medications   blood glucose meter kit and supplies Kit   Insulin Pen Needle 31G X 5 MM Misc   megestrol 40 MG tablet Commonly known as:  MEGACE Replaced by:  megestrol 400 MG/10ML suspension     TAKE these medications   CALCIUM-MAGNESIUM-ZINC PO Take 1 tablet by mouth 2 (two) times daily.   carvedilol 6.25 MG tablet Commonly known as:  COREG Take 1 tablet (6.25 mg total) by mouth 2 (two) times daily with a meal.   clomiPHENE 50 MG tablet Commonly known as:  CLOMID Take 25 mg by mouth daily.   folic acid 956 MCG tablet Commonly known as:  FOLVITE Take 400 mcg by mouth daily.   furosemide 20 MG tablet Commonly known as:  LASIX Take 1 tablet (20 mg total) by mouth every Monday, Wednesday, and Friday. Start taking on:  12/02/2017 What changed:    medication strength  when to take this   gabapentin 600 MG tablet Commonly known as:  NEURONTIN Take 600 mg by mouth 3 (three) times daily as needed.   hydrocortisone 20 MG tablet Commonly known as:  CORTEF Take 10-20 mg by mouth See admin instructions. Take 20 mg by mouth in the morning then 10 mg at noon(time) then 20 mg at bedtime     Insulin Glargine 100 UNIT/ML Solostar Pen Commonly known as:  LANTUS Inject 26 Units  into the skin at bedtime.   lidocaine 5 % Commonly known as:  LIDODERM Place 1 patch onto the skin daily. Remove & Discard patch within 12 hours or as directed by MD Notes to patient:  No patch currently on patient   megestrol 400 MG/10ML suspension Commonly known as:  MEGACE Take 10 mLs (400 mg total) by mouth daily. Replaces:  megestrol 40 MG tablet   methocarbamol 500 MG tablet Commonly known as:  ROBAXIN Take 1 tablet (500 mg total) by mouth 3 (three) times daily.   nitroGLYCERIN 0.4 MG SL tablet Commonly known as:  NITROSTAT PLACE 1 TABLET UNDER TONGUE EVERY 5 MINUTES AS NEEDED FOR CHEST PAIN (UP TO 3 DOSES) What changed:    how much to take  how to take this  when to take this  reasons to take this  additional instructions   ondansetron 4 MG tablet Commonly known as:  ZOFRAN Take 1 tablet (4 mg total) by mouth every 6 (six) hours as needed for nausea.   oxyCODONE 5 MG immediate release tablet Commonly known as:  Oxy IR/ROXICODONE Take 1 tablet (5 mg total) by mouth every 4 (four) hours as needed for moderate pain.   OXYGEN Inhale 2 L into the lungs continuous.   potassium chloride 10 MEQ tablet Commonly known as:  K-DUR,KLOR-CON Take 10 mEq by mouth daily.   promethazine 25 MG tablet Commonly known as:  PHENERGAN Take 25 mg by mouth every 6 (six) hours as needed for nausea.   senna-docusate 8.6-50 MG tablet Commonly known as:  Senokot-S Take 2 tablets by mouth 2 (two) times daily.   thyroid 60 MG tablet Commonly known as:  ARMOUR Take 60 mg by mouth daily.   traZODone 100 MG tablet Commonly known as:  DESYREL Take 100 mg by mouth at bedtime.   UNABLE TO FIND Take 1 tablet by mouth 2 (two) times daily. Life Extension Multivitamins   vitamin B-12 1000 MCG tablet Commonly known as:  CYANOCOBALAMIN Take 1,000 mcg by mouth daily.   Vitamin D-3 25 MCG (1000 UT)  Caps Take 1,000 Units by mouth daily.   VITAMIN E PO Take 1 capsule by mouth daily.   warfarin 3 MG tablet Commonly known as:  COUMADIN Take as directed. If you are unsure how to take this medication, talk to your nurse or doctor. Original instructions:  TAKE AS DIRECTED BY COUMADIN CLINIC What changed:    how much to take  how to take this  when to take this  additional instructions       Major procedures and Radiology Reports - PLEASE review detailed and final reports for all details, in brief -   Dg Thoracic Spine 2 View  Result Date: 11/28/2017 CLINICAL DATA:  Acute mid back pain following fall today. Initial encounter. EXAM: THORACIC SPINE 2 VIEWS COMPARISON:  07/07/2017 chest radiograph and prior studies FINDINGS: No acute fracture or subluxation identified. Mild multilevel degenerative disc disease noted. Probable DISH changes noted. No suspicious focal bony lesions are present. IMPRESSION: No acute abnormality.  Mild degenerative disc disease/spondylosis. Electronically Signed   By: Margarette Canada M.D.   On: 11/28/2017 13:41   Dg Lumbar Spine Complete  Result Date: 11/28/2017 CLINICAL DATA:  Status post fall today. Mid and low back pain and left hip pain. EXAM: LUMBAR SPINE - COMPLETE 4+ VIEW COMPARISON:  Thoracic spine series of today's date and lumbar spine series of November 15, 2017 FINDINGS: The twelfth ribs are hypoplastic. The lumbar vertebral  bodies are preserved in height. There is multilevel disc space narrowing greatest at L3-4 and L5-S1. There is no spondylolisthesis. There are anterior and lateral bridging osteophytes at multiple levels. The pedicles and transverse processes are intact. There is chronic reversal of the normal lumbar lordosis. IMPRESSION: Multilevel degenerative disc and facet joint changes. No compression fracture, spondylolisthesis, nor other acute bony abnormality of the lumbar spine. Electronically Signed   By: David  Martinique M.D.   On: 11/28/2017  13:37   Dg Lumbar Spine Complete  Result Date: 11/15/2017 CLINICAL DATA:  [place] 2 days ago, chronic low back pain EXAM: LUMBAR SPINE - COMPLETE 4+ VIEW COMPARISON:  MR lumbar spine of 09/29/2016, CT abdomen pelvis of 03/20/2012 FINDINGS: The lumbar vertebrae remain straightened in alignment. There is diffuse degenerative disc disease present greatest at L3-4 and L5-S1 levels. No new compression deformity is seen. There are degenerative changes throughout the facet joints of the lumbar spine. The SI joints appear corticated. The bowel gas pattern is nonspecific. IMPRESSION: No change in straightened alignment. Diffuse degenerative disc disease. No compression deformity. Electronically Signed   By: Ivar Drape M.D.   On: 11/15/2017 12:28   Ct Lumbar Spine Wo Contrast  Result Date: 11/29/2017 CLINICAL DATA:  Back pain, progressive. EXAM: CT LUMBAR SPINE WITHOUT CONTRAST TECHNIQUE: Multidetector CT imaging of the lumbar spine was performed without intravenous contrast administration. Multiplanar CT image reconstructions were also generated. COMPARISON:  Lumbar spine MRI 11/28/2017 Lumbar spine radiograph 11/15/2017 Chest CT 04/22/2017 Lumbar spine MRI 09/29/2016 CT abdomen pelvis 03/20/2012 FINDINGS: Segmentation: 5 lumbar type vertebrae. Alignment: Grade 1 retrolisthesis at L4-5. Vertebrae: There is no acute fracture. No lytic or blastic lesions. No evidence of discitis-osteomyelitis. Paraspinal and other soft tissues: Right renal cyst measures up to 5.3 cm. There is calcific aortic atherosclerosis. Disc levels: Assessment of the spinal canal and neural foramina is better characterized by the MRI of 11/28/2017. At the T12-L1 level, there is disc vacuum phenomenon without acute abnormality. There is moderate-to-severe facet arthrosis with a large anterior and left lateral osteophyte. Compared to the CT of 03/20/2012, there has been progressive widening of the anterior aspect of the disc space. The appearance is  unchanged, however, since the radiograph of 11/15/2017. The widening is mildly progressed since 04/22/2017. IMPRESSION: 1. No acute fracture or other acute osseous abnormality at the T12-L1 level. 2. Progressive widening of the anterior disc space at T12-L1 with associated moderate-to-severe facet arthrosis may indicate a degree of chronic segmental instability. 3. Assessment of the spinal canal and neural foramina is better characterized on yesterday's lumbar spine MRI. No change since that study. 4.  Aortic atherosclerosis (ICD10-I70.0). Electronically Signed   By: Ulyses Jarred M.D.   On: 11/29/2017 01:19   Mr Thoracic Spine Wo Contrast  Result Date: 11/28/2017 CLINICAL DATA:  Initial evaluation for acute/progressive myelopathy, back pain. EXAM: MRI THORACIC AND LUMBAR SPINE WITHOUT CONTRAST TECHNIQUE: Multiplanar and multiecho pulse sequences of the thoracic and lumbar spine were obtained without intravenous contrast. COMPARISON:  Prior radiograph from earlier the same day as well as previous MRI from 09/29/2016. FINDINGS: MRI THORACIC SPINE FINDINGS Alignment: Vertebral bodies normally aligned with preservation of the normal thoracic kyphosis. No listhesis. Vertebrae: The vertebral body heights extending from T1 through T11 are maintained without evidence for acute or chronic fracture. T12-L1 discussed below on lumbar spine portion of this exam. Underlying bone marrow signal intensity mildly heterogeneous but within normal limits. Small benign hemangiomas noted within the T10 vertebral body. No worrisome osseous  lesions. Abnormal marrow edema about the T12-L1 interspace, discussed below. Cord: Signal intensity within the thoracic spinal cord within normal limits. Paraspinal and other soft tissues: Soft tissue edema about the T12-L1 interspace, discussed below. Paraspinous soft tissues demonstrate no other acute abnormality. 16 mm well-circumscribed cystic lesion within the subcutaneous fat of the left mid  back likely reflects a sebaceous cyst. Trace bilateral pleural effusions noted. Few T2 hyperintense cyst noted within the visualized kidneys. Disc levels: T1-2: Mild diffuse disc bulge. Moderate facet hypertrophy, greater on the left. No significant spinal stenosis. Moderate bilateral neural foraminal narrowing. T2-3: Moderate facet hypertrophy with resultant mild to moderate bilateral foraminal narrowing. No spinal stenosis. T3-4:  Unremarkable. T4-5:  Mild facet hypertrophy.  No stenosis. T5-6:  Unremarkable. T6-7:  Mild diffuse disc bulge.  No significant stenosis. T7-8:  Unremarkable. T8-9: Minimal disc bulge. Mild facet hypertrophy. No significant stenosis. T9-10: Diffuse disc bulge. Posterior element hypertrophy. No significant stenosis. T10-11: Mild disc bulge. Posterior element hypertrophy. No significant stenosis. T11-12:  Mild facet hypertrophy.  No stenosis. MRI LUMBAR SPINE FINDINGS Segmentation: Normal segmentation. Lowest well-formed disc labeled the L5-S1 level. Same numbering system employed as on previous exams. Alignment: Straightening with slight reversal of the normal lumbar lordosis, stable. No interval malalignment. Vertebrae: There is abnormal edema involving the inferior endplate of S34, and to a lesser extent the adjacent superior endplate of L1. Edema extends into the right greater than left posterior elements. Associated edema within the intervening T12-L1 interspace. Prominent soft tissue edema present within the right greater than left posterior and psoas musculature bilaterally. No discrete collections identified. While these findings are nonspecific, and may in part be degenerative in nature, sequelae of possible traumatic injury could be considered given the history of recent fall. There is question of a possible fracture through the inferior endplate of H96 and/or Q22-L7 disc, although no definite discrete fracture line evident by MRI. Degenerative ask you disc phenomena within the  T12-L1 disc space felt to make infection unlikely. Vertebral body heights otherwise maintained without evidence for acute or chronic fracture. Underlying bone marrow signal intensity heterogeneous but within normal limits. No worrisome osseous lesions. Conus medullaris and cauda equina: Conus extends to the T12-L1 level. Conus and cauda equina appear normal. Paraspinal and other soft tissues: Paraspinous edema adjacent to the T12-L1 interspace as above, right greater than left. Paraspinous soft tissues otherwise within normal limits. Scattered renal cysts noted bilaterally. Disc levels: T12-L1: Disc bulge with disc desiccation and intervertebral disc space narrowing. Slight asymmetric widening of the anterior T12-L1 disc space. Edema within the adjacent inferior T12 vertebral body, raising the possibility for possible occult endplate fracture. Edema present within the intervening T12-L1 disc space as well as the superior aspect of L1. Extension into the posterior elements, greater on the right. Superimposed moderate facet hypertrophy. 13 mm synovial cyst at the posterior aspect of the left T12-L1 facet. Probable additional tiny 5 mm synovial cyst at the anterior margin of the left T12-L1 facet (series 25, image 9). Resultant moderate to severe spinal stenosis, progressed from previous. Moderate bilateral foraminal narrowing. L1-2: Diffuse disc bulge with mild reactive endplate changes. No significant spinal stenosis. Mild left L1 foraminal narrowing. L2-3: Mild disc bulge with disc desiccation and intervertebral disc space narrowing. No significant spinal stenosis. No significant foraminal encroachment. L3-4: Chronic severe intervertebral disc space narrowing with diffuse disc bulge and disc desiccation. Mild reactive endplate changes with marginal endplate osteophytic spurring. Mild facet hypertrophy. Resultant moderate canal and bilateral subarticular stenosis.  Mild to moderate bilateral L3 foraminal narrowing,  right worse than left. L4-5: Diffuse disc bulge with disc desiccation and intervertebral disc space narrowing. Facet and ligamentum flavum hypertrophy. Epidural lipomatosis. Moderate left lateral recess narrowing, stable from previous. Moderate left with mild right L4 foraminal narrowing, also stable. L5-S1: Chronic intervertebral disc space narrowing with diffuse disc bulge and disc desiccation. Chronic reactive endplate changes with marginal endplate osteophytic spurring. Superimposed right paracentral disc extrusion with superior migration, stable. Epidural lipomatosis. Bilateral facet hypertrophy. Resultant moderate to advanced bilateral L5 foraminal narrowing with moderate bilateral lateral recess stenosis, stable. IMPRESSION: MR THORACIC SPINE IMPRESSION 1. Abnormal marrow edema involving the T12 and L1 vertebral bodies, with extension into the right greater than left posterior elements. While these findings are likely at least partly degenerative in nature, appearance and history raises the possibility for superimposed acute and/or recent traumatic injury, with possible fracture through the inferior endplate of Z61 and/or W96-E4 disc space. Further assessment with dedicated CT recommended for further evaluation. Resultant moderate to severe spinal stenosis at this level has mildly worsened from previous. 2. No other acute traumatic injury within the thoracic spine. 3. Additional fairly mild multilevel degenerative spondylolysis as above without significant spinal stenosis. Moderate bilateral foraminal narrowing within the upper thoracic spine related to facet hypertrophy as above. MR LUMBAR SPINE IMPRESSION 1. Overall stable appearance of the lumbar spine with no other acute traumatic injury or other finding identified. 2. Stable multifactorial degenerative changes with resultant moderate canal and lateral recess stenosis at L3-4. 3. Left eccentric disc osteophyte at L4-5 with resultant moderate left lateral  recess stenosis, stable. 4. Multifactorial degenerative changes with resultant multilevel foraminal narrowing as above, moderate on the left at L4 and bilaterally at L5. Current attempt being made to contact the ordering provider. Results will be communicated as soon as possible. Electronically Signed   By: Jeannine Boga M.D.   On: 11/28/2017 23:00   Mr Lumbar Spine Wo Contrast  Result Date: 11/28/2017 CLINICAL DATA:  Initial evaluation for acute/progressive myelopathy, back pain. EXAM: MRI THORACIC AND LUMBAR SPINE WITHOUT CONTRAST TECHNIQUE: Multiplanar and multiecho pulse sequences of the thoracic and lumbar spine were obtained without intravenous contrast. COMPARISON:  Prior radiograph from earlier the same day as well as previous MRI from 09/29/2016. FINDINGS: MRI THORACIC SPINE FINDINGS Alignment: Vertebral bodies normally aligned with preservation of the normal thoracic kyphosis. No listhesis. Vertebrae: The vertebral body heights extending from T1 through T11 are maintained without evidence for acute or chronic fracture. T12-L1 discussed below on lumbar spine portion of this exam. Underlying bone marrow signal intensity mildly heterogeneous but within normal limits. Small benign hemangiomas noted within the T10 vertebral body. No worrisome osseous lesions. Abnormal marrow edema about the T12-L1 interspace, discussed below. Cord: Signal intensity within the thoracic spinal cord within normal limits. Paraspinal and other soft tissues: Soft tissue edema about the T12-L1 interspace, discussed below. Paraspinous soft tissues demonstrate no other acute abnormality. 16 mm well-circumscribed cystic lesion within the subcutaneous fat of the left mid back likely reflects a sebaceous cyst. Trace bilateral pleural effusions noted. Few T2 hyperintense cyst noted within the visualized kidneys. Disc levels: T1-2: Mild diffuse disc bulge. Moderate facet hypertrophy, greater on the left. No significant spinal  stenosis. Moderate bilateral neural foraminal narrowing. T2-3: Moderate facet hypertrophy with resultant mild to moderate bilateral foraminal narrowing. No spinal stenosis. T3-4:  Unremarkable. T4-5:  Mild facet hypertrophy.  No stenosis. T5-6:  Unremarkable. T6-7:  Mild diffuse disc bulge.  No significant  stenosis. T7-8:  Unremarkable. T8-9: Minimal disc bulge. Mild facet hypertrophy. No significant stenosis. T9-10: Diffuse disc bulge. Posterior element hypertrophy. No significant stenosis. T10-11: Mild disc bulge. Posterior element hypertrophy. No significant stenosis. T11-12:  Mild facet hypertrophy.  No stenosis. MRI LUMBAR SPINE FINDINGS Segmentation: Normal segmentation. Lowest well-formed disc labeled the L5-S1 level. Same numbering system employed as on previous exams. Alignment: Straightening with slight reversal of the normal lumbar lordosis, stable. No interval malalignment. Vertebrae: There is abnormal edema involving the inferior endplate of M01, and to a lesser extent the adjacent superior endplate of L1. Edema extends into the right greater than left posterior elements. Associated edema within the intervening T12-L1 interspace. Prominent soft tissue edema present within the right greater than left posterior and psoas musculature bilaterally. No discrete collections identified. While these findings are nonspecific, and may in part be degenerative in nature, sequelae of possible traumatic injury could be considered given the history of recent fall. There is question of a possible fracture through the inferior endplate of U27 and/or O53-G6 disc, although no definite discrete fracture line evident by MRI. Degenerative ask you disc phenomena within the T12-L1 disc space felt to make infection unlikely. Vertebral body heights otherwise maintained without evidence for acute or chronic fracture. Underlying bone marrow signal intensity heterogeneous but within normal limits. No worrisome osseous lesions. Conus  medullaris and cauda equina: Conus extends to the T12-L1 level. Conus and cauda equina appear normal. Paraspinal and other soft tissues: Paraspinous edema adjacent to the T12-L1 interspace as above, right greater than left. Paraspinous soft tissues otherwise within normal limits. Scattered renal cysts noted bilaterally. Disc levels: T12-L1: Disc bulge with disc desiccation and intervertebral disc space narrowing. Slight asymmetric widening of the anterior T12-L1 disc space. Edema within the adjacent inferior T12 vertebral body, raising the possibility for possible occult endplate fracture. Edema present within the intervening T12-L1 disc space as well as the superior aspect of L1. Extension into the posterior elements, greater on the right. Superimposed moderate facet hypertrophy. 13 mm synovial cyst at the posterior aspect of the left T12-L1 facet. Probable additional tiny 5 mm synovial cyst at the anterior margin of the left T12-L1 facet (series 25, image 9). Resultant moderate to severe spinal stenosis, progressed from previous. Moderate bilateral foraminal narrowing. L1-2: Diffuse disc bulge with mild reactive endplate changes. No significant spinal stenosis. Mild left L1 foraminal narrowing. L2-3: Mild disc bulge with disc desiccation and intervertebral disc space narrowing. No significant spinal stenosis. No significant foraminal encroachment. L3-4: Chronic severe intervertebral disc space narrowing with diffuse disc bulge and disc desiccation. Mild reactive endplate changes with marginal endplate osteophytic spurring. Mild facet hypertrophy. Resultant moderate canal and bilateral subarticular stenosis. Mild to moderate bilateral L3 foraminal narrowing, right worse than left. L4-5: Diffuse disc bulge with disc desiccation and intervertebral disc space narrowing. Facet and ligamentum flavum hypertrophy. Epidural lipomatosis. Moderate left lateral recess narrowing, stable from previous. Moderate left with mild  right L4 foraminal narrowing, also stable. L5-S1: Chronic intervertebral disc space narrowing with diffuse disc bulge and disc desiccation. Chronic reactive endplate changes with marginal endplate osteophytic spurring. Superimposed right paracentral disc extrusion with superior migration, stable. Epidural lipomatosis. Bilateral facet hypertrophy. Resultant moderate to advanced bilateral L5 foraminal narrowing with moderate bilateral lateral recess stenosis, stable. IMPRESSION: MR THORACIC SPINE IMPRESSION 1. Abnormal marrow edema involving the T12 and L1 vertebral bodies, with extension into the right greater than left posterior elements. While these findings are likely at least partly degenerative in nature, appearance  and history raises the possibility for superimposed acute and/or recent traumatic injury, with possible fracture through the inferior endplate of F81 and/or O17-P1 disc space. Further assessment with dedicated CT recommended for further evaluation. Resultant moderate to severe spinal stenosis at this level has mildly worsened from previous. 2. No other acute traumatic injury within the thoracic spine. 3. Additional fairly mild multilevel degenerative spondylolysis as above without significant spinal stenosis. Moderate bilateral foraminal narrowing within the upper thoracic spine related to facet hypertrophy as above. MR LUMBAR SPINE IMPRESSION 1. Overall stable appearance of the lumbar spine with no other acute traumatic injury or other finding identified. 2. Stable multifactorial degenerative changes with resultant moderate canal and lateral recess stenosis at L3-4. 3. Left eccentric disc osteophyte at L4-5 with resultant moderate left lateral recess stenosis, stable. 4. Multifactorial degenerative changes with resultant multilevel foraminal narrowing as above, moderate on the left at L4 and bilaterally at L5. Current attempt being made to contact the ordering provider. Results will be communicated  as soon as possible. Electronically Signed   By: Jeannine Boga M.D.   On: 11/28/2017 23:00   Dg Hip Unilat W Or Wo Pelvis 2-3 Views Left  Result Date: 11/28/2017 CLINICAL DATA:  Acute LEFT hip pain following fall today. Initial encounter. EXAM: DG HIP (WITH OR WITHOUT PELVIS) 2-3V LEFT COMPARISON:  03/20/2012 abdomen/pelvis CT. FINDINGS: No acute fracture, subluxation or dislocation noted. Mild degenerative changes in both hips noted. Mild to moderate degenerative changes in the LOWER lumbar spine again identified. No suspicious focal bony lesions noted. IMPRESSION: 1. No acute abnormality 2. 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 Electronically Signed   By: Margarette Canada M.D.   On: 11/28/2017 13:39    Micro Results    Recent Results (from the past 240 hour(s))  MRSA PCR Screening     Status: None   Collection Time: 11/29/17  2:53 PM  Result Value Ref Range Status   MRSA by PCR NEGATIVE NEGATIVE Final    Comment:        The GeneXpert MRSA Assay (FDA approved for NASAL specimens only), is one component of a comprehensive MRSA colonization surveillance program. It is not intended to diagnose MRSA infection nor to guide or monitor treatment for MRSA infections.     Today   Subjective    [name] today has no new complaints....    continue physical therapy and pain management  At SNF           Patient has been seen and examined prior to discharge   Objective   Blood pressure 120/70, pulse 92, temperature 98.3 F (36.8 C), temperature source Oral, resp. rate 20, height _0  (1.778 m), weight 92.1 kg, SpO2 96 %.   Intake/Output Summary (Last 24 hours) at 12/01/2017 1443 Last data filed at 12/01/2017 0900 Gross per 24 hour  Intake 700 ml  Output 300 ml  Net 400 ml    Exam Patient is examined daily including today on 12/01/17 , exams remain the same as of yesterday except that has changed   Gen:- Awake Alert, no acute distress HEENT:-  [place].AT, No sclera icterus Neck-Supple Neck,No JVD,.  Lungs-  CTAB good air movement CV- S1, S2 normal, regular Abd-  +ve B.Sounds, Abd Soft, No tenderness,    Extremity/Skin:- No  edema, fair pulses Psych-affect is flat, patient with cognitive deficits Neuro-generalized weakness without  new focal deficits, no tremors   Data Review   CBC w Diff:  Lab  Results  Component Value Date   WBC 12.3 (H) 11/30/2017   HGB 11.8 (L) 11/30/2017   HGB 12.6 (L) 05/19/2016   HGB 15.1 05/22/2007   HCT 39.6 11/30/2017   HCT 37.8 05/19/2016   HCT 44.3 05/22/2007   PLT 382 11/30/2017   PLT 347 05/19/2016   LYMPHOPCT 22 11/28/2017   LYMPHOPCT 34.0 05/22/2007   MONOPCT 10 11/28/2017   MONOPCT 7.9 05/22/2007   EOSPCT 3 11/28/2017   EOSPCT 6.4 05/22/2007   BASOPCT 1 11/28/2017   BASOPCT 0.5 05/22/2007    CMP:  Lab Results  Component Value Date   NA 138 11/30/2017   NA 143 05/19/2016   K 4.1 11/30/2017   CL 107 11/30/2017   CO2 26 11/30/2017   BUN 24 (H) 11/30/2017   BUN 17 05/19/2016   CREATININE 1.63 (H) 11/30/2017   CREATININE 2.13 (H) 10/29/2015   PROT 6.6 07/07/2017   PROT 6.9 06/10/2015   ALBUMIN 3.1 (L) 07/07/2017   BILITOT 0.5 07/07/2017   ALKPHOS 76 07/07/2017   AST 36 07/07/2017   ALT 50 07/07/2017   Total Discharge time is about 34 minutes  Roxan Hockey M.D on 12/01/2017 at 2:43 PM  Pager---[phone]  Go to [email or web address] - password [email or web address] for contact info  [facility] - Office  [phone]

## 2017-12-01 NOTE — Clinical Social Work Placement (Signed)
   CLINICAL SOCIAL WORK PLACEMENT  NOTE  Date:  12/01/2017  Patient Details  Name: [name] MRN: [id number] Date of Birth: 09-Feb-1940  Clinical Social Work is seeking post-discharge placement for this patient at the [place] level of care (*CSW will initial, date and re-position this form in  chart as items are completed):      Patient/family provided with [place] Work Department's list of facilities offering this level of care within the geographic area requested by the patient (or if unable, by the patient's family).      Patient/family informed of their freedom to choose among providers that offer the needed level of care, that participate in Medicare, Medicaid or managed care program needed by the patient, have an available bed and are willing to accept the patient.      Patient/family informed of [place]'s ownership interest in [facility] and [facility], as well as of the fact that they are under no obligation to receive care at these facilities.  PASRR submitted to EDS on 11/29/17     PASRR number received on 11/29/17     Existing PASRR number confirmed on       FL2 transmitted to all facilities in geographic area requested by pt/family on 11/29/17     FL2 transmitted to all facilities within larger geographic area on       Patient informed that his/her managed care company has contracts with or will negotiate with certain facilities, including the following:        Yes   Patient/family informed of bed offers received.  Patient chooses bed at [facility]     Physician recommends and patient chooses bed at      Patient to be transferred to [facility] on 12/01/17.  Patient to be transferred to facility by [facility]     Patient family notified on 12/01/17 of transfer.  Name of family member notified:  [name]     PHYSICIAN       Additional Comment:     _______________________________________________ Candie Chroman, LCSW 12/01/2017, 2:52 PM

## 2017-12-01 NOTE — Progress Notes (Signed)
Report called to [place] at [employer] [phone].

## 2017-12-01 NOTE — Progress Notes (Signed)
Occupational Therapy Treatment Patient Details Name: [name] MRN: [id number] DOB: 05/07/1940 Today's Date: 12/01/2017    History of present illness Pt is a 77 y/o male admitted secondary to worsening back pain and generalized weakness. Lumbar spine imaging revealed degenerative changes. Pt also found to have hypokalemia. PMH includes dementia. MRI: moderate to severe spinal stenosis T12-L1   OT comments  Pt pleasant and eager to get OOB. Back pain with transitional movements. Encouraged log roll technique to minimize pain with bed mobility. Min assist for sit to stand x 3 and pivot transfers. Pt with tremulous LEs and near buckling with third transfer. Performed UB dressing with min assist and donned socks in supported sitting with set up. Completed seated grooming with set up. Pt is a high fall risk. Continue to recommend SNF for further rehab.   Follow Up Recommendations  SNF;Supervision/Assistance - 24 hour    Equipment Recommendations       Recommendations for Other Services      Precautions / Restrictions Precautions Precautions: Fall;Back Precaution Comments: educated in back precautions for comfort Restrictions Weight Bearing Restrictions: No       Mobility Bed Mobility Overal bed mobility: Needs Assistance Bed Mobility: Rolling;Sidelying to Sit Rolling: Min assist Sidelying to sit: Mod assist       General bed mobility comments: cues for log roll technique to minimize pain, use of pad to roll pt to side, assist for LEs over EOB and to raise trunk  Transfers Overall transfer level: Needs assistance Equipment used: Rolling walker (2 wheeled) Transfers: Sit to/from [facility] Sit to Stand: Min assist Stand pivot transfers: Min assist       General transfer comment: cues for hand placement, assist to rise and steady, pt performed sit<>stand x 3, with third trial pt with tremulous LEs, near buckling    Balance Overall balance  assessment: Needs assistance   Sitting balance-Leahy Scale: Fair Sitting balance - Comments: reliant on at least one UE when seated at EOB     Standing balance-Leahy Scale: Poor Standing balance comment: reliant on RW and additonal external support                           ADL either performed or assessed with clinical judgement   ADL Overall ADL's : Needs assistance/impaired     Grooming: Wash/dry hands;Wash/dry face;Brushing hair;Sitting;Set up Grooming Details (indicate cue type and reason): supported sitting in chair         Upper Body Dressing : Minimal assistance Upper Body Dressing Details (indicate cue type and reason): front opening gown Lower Body Dressing: Set up;Sitting/lateral leans Lower Body Dressing Details (indicate cue type and reason): able to don socks crossing foot over opposite knee in supported sitting Toilet Transfer: Minimal assistance;Stand-pivot;BSC;RW   Toileting- Clothing Manipulation and Hygiene: Total assistance;Sit to/from stand         General ADL Comments: pt with bowel incontinence, repeatedly asking to sit on the [facility]     Vision       Perception     Praxis      Cognition Arousal/Alertness: Awake/alert Behavior During Therapy: WFL for tasks assessed/performed Overall Cognitive Status: No family/caregiver present to determine baseline cognitive functioning                                 General Comments: impaired memory, hx of dementia  Exercises     Shoulder Instructions       General Comments      Pertinent Vitals/ Pain       Pain Assessment: Faces Faces Pain Scale: Hurts little more Pain Location: back  Pain Descriptors / Indicators: Guarding;Grimacing Pain Intervention(s): Monitored during session;Repositioned  Home Living                                          Prior Functioning/Environment              Frequency  Min 2X/week        Progress  Toward Goals  OT Goals(current goals can now be found in the care plan section)  Progress towards OT goals: Progressing toward goals  Acute Rehab OT Goals Patient Stated Goal: to go home OT Goal Formulation: With patient Time For Goal Achievement: 12/13/17 Potential to Achieve Goals: Good  Plan Discharge plan remains appropriate    Co-evaluation                 AM-PAC PT "6 Clicks" Daily Activity     Outcome Measure   Help from another person eating meals?: None Help from another person taking care of personal grooming?: A Little Help from another person toileting, which includes using toliet, bedpan, or urinal?: Total Help from another person bathing (including washing, rinsing, drying)?: A Lot Help from another person to put on and taking off regular upper body clothing?: A Little Help from another person to put on and taking off regular lower body clothing?: A Lot 6 Click Score: 15    End of Session Equipment Utilized During Treatment: Gait belt;Rolling walker  OT Visit Diagnosis: Unsteadiness on feet (R26.81);Other abnormalities of gait and mobility (R26.89);History of falling (Z91.81);Pain;Muscle weakness (generalized) (M62.81);Other symptoms and signs involving cognitive function   Activity Tolerance Patient tolerated treatment well   Patient Left in chair;with call bell/phone within reach;with chair alarm set   Nurse Communication Mobility status        Time: 8250-5397 OT Time Calculation (min): 34 min  Charges: OT General Charges $OT Visit: 1 Visit OT Treatments $Self Care/Home Management : 23-37 mins  Nestor Lewandowsky, OTR/L Acute Rehabilitation Services Pager: [phone] Office: [phone]   Malka So 12/01/2017, 10:11 AM

## 2017-12-01 NOTE — Clinical Social Work Note (Addendum)
Insurance authorization still pending.  [name], [place] [phone]  12:58 pm Insurance authorization approved. Sent message to MD to notify. Left voicemail for wife.  [name], [place]

## 2017-12-01 NOTE — Clinical Social Work Note (Signed)
CSW facilitated patient discharge including contacting patient family and facility to confirm patient discharge plans. Clinical information faxed to facility and family agreeable with plan. CSW arranged ambulance transport via [facility] to [facility] at 4:00. RN to call report prior to discharge [phone]).  CSW will sign off for now as social work intervention is no longer needed. Please consult [place] again if new needs arise.  Dayton Scrape, [place]

## 2017-12-02 DIAGNOSIS — I10 Essential (primary) hypertension: Secondary | ICD-10-CM | POA: Diagnosis not present

## 2017-12-02 DIAGNOSIS — M545 Low back pain: Secondary | ICD-10-CM | POA: Diagnosis not present

## 2017-12-02 DIAGNOSIS — E291 Testicular hypofunction: Secondary | ICD-10-CM | POA: Diagnosis not present

## 2017-12-02 DIAGNOSIS — E1122 Type 2 diabetes mellitus with diabetic chronic kidney disease: Secondary | ICD-10-CM | POA: Diagnosis not present

## 2017-12-04 ENCOUNTER — Other Ambulatory Visit: Payer: Self-pay | Admitting: Cardiovascular Disease

## 2017-12-05 ENCOUNTER — Non-Acute Institutional Stay: Payer: Medicare HMO | Admitting: Primary Care

## 2017-12-05 ENCOUNTER — Encounter: Payer: Self-pay | Admitting: Primary Care

## 2017-12-05 ENCOUNTER — Telehealth: Payer: Self-pay | Admitting: Cardiovascular Disease

## 2017-12-05 DIAGNOSIS — M5440 Lumbago with sciatica, unspecified side: Secondary | ICD-10-CM

## 2017-12-05 DIAGNOSIS — Z515 Encounter for palliative care: Secondary | ICD-10-CM | POA: Diagnosis not present

## 2017-12-05 DIAGNOSIS — I2510 Atherosclerotic heart disease of native coronary artery without angina pectoris: Secondary | ICD-10-CM

## 2017-12-05 DIAGNOSIS — F321 Major depressive disorder, single episode, moderate: Secondary | ICD-10-CM

## 2017-12-05 DIAGNOSIS — E119 Type 2 diabetes mellitus without complications: Secondary | ICD-10-CM

## 2017-12-05 DIAGNOSIS — Z9861 Coronary angioplasty status: Secondary | ICD-10-CM | POA: Diagnosis not present

## 2017-12-05 NOTE — Telephone Encounter (Signed)
° °  Patient's spouse calling to patient is at a SNF She would like to discuss his Coumadin. Requesting call

## 2017-12-05 NOTE — Progress Notes (Signed)
PALLIATIVE CARE CONSULT VISIT   PATIENT NAME: [name] DOB: 09/01/1940 MRN: [id number]  PRIMARY CARE PROVIDER:   Lawerance Cruel, MD  REFERRING PROVIDER:  Lawerance Cruel, MD [place], [name] [id number]   Dennis Brothers, MD  RESPONSIBLE PARTY:   Wife  Peaster,[name] [phone]  [phone]   ASSESSMENT and RECOMMENDATIONS :  1. Pain management: Acetaminophen 1000 mg q 8 hrs po for pain, zoloft 25 mg q hs for depression/ pain adjuvant. Appears to be newly on methocarbamol/  Monitor bleeding risks with coumadin.  Severe and chronic pain from spinal stenosis. Reports low mood as well as a result of the pain and functional loss. Voiced desire to try anti depressant.  Realizes there is not a good option for treatment that does not carry risks for pain aggravation. Favors conservative treatments. States he will try to be more active as well with the therapy program at the SNF.  2. Frequent falls: PT, OT at SNF. Manage pain as above to improve function.  Pt and wife are distressed about loss of mobility. He has also been falling at home and his needs are increasing demands on caregivers. Wife is investigating long term placement if rehabilitation is not sufficient to return home to provide care.  3. Goals of Care: Wife is POA. States he wants to go to hospital if he needs to. States he wants to return home after rehab is finished and states he wants to hunt and mow his grass.  Previous NP visit documents living will with DNR and MOST with DNAR, DNI, intermediate interventions and antibiotics/iv. No tube feedings Will continue to follow to review and  clarify goals of care.  Will continue to follow for palliative and goals of care, return 2 weeks.   I spent 45 minutes providing this consultation,  from 1515  to 1600.  More than 50% of the time in this consultation was spent coordinating communication.   HISTORY OF PRESENT ILLNESS:  [name]  is a 77 y.o. year old male with multiple medical problems including Addison's disease, CHF(EF 50% in March 2019), respiratory failure, stage 3 CKD, peripheral neuropathy, dementia with progressive weakness and recurrent falls.Palliative Care was asked to help address goals of care.   CODE STATUS: DNR  PPS: 40% HOSPICE ELIGIBILITY/DIAGNOSIS: TBD  PAST MEDICAL HISTORY:  Past Medical History:  Diagnosis Date  . Acute lower GI bleeding    a. admitted to [facility] 04/11/2013;  b. 04/2014 EGD: 1.  gastric erosions, mild prox gastritis and bulbar duodenitis->Protonix.  . Addison's disease ([place])   . Anemia   . Anemia in CKD (chronic kidney disease) 06/25/2015  . Chronic back pain   . Chronic diastolic CHF (congestive heart failure) ([place])    a. His initial ejection fraction was between 30 and 35%;  b. 04/2013 Echo: EF 50-55% Gr1 DD, mild-mod MR;  c. 04/2014 Echo: EF 60-65%, Gr 1 DD, triv TR.  . CKD (chronic kidney disease), stage III ([place])   . Coronary artery disease    a. 07/2014 - s/p difficult PCI - had pressure wire analysis of LAD s/p DES in mid segment c/b wire-induced dissection in the distal segment of the LAD which was treated with repeated balloon inflations. Also had DES to rPDA - again had either spasm distal to the stent placement or an edge dissection but the vessel was small in that area; procedure aborted.  . Daily headache   . GERD (gastroesophageal reflux  disease)   . H/O hiatal hernia   . Hepatitis    a. 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  . History of aortic insufficiency   . Hx of cardiovascular stress test    a. ETT-Myoview 7/14:  Low risk, inferior defect consistent with thinning, no ischemia, normal wall motion, EF 54%  . Hypertension   . Hypogonadism male   . Obesity   . Peptic ulcer disease   . Pulmonary embolism ([place])    a. 04/2014 CTA: Bilat submassive PE ->coumadin.  . Pulmonary HTN ([place])   . Thyroid disease     SOCIAL HX:  Social History   Tobacco Use  . Smoking status: Never Smoker  .  Smokeless tobacco: Never Used  Substance Use Topics  . Alcohol use: No    Alcohol/week: 0.0 standard drinks    ALLERGIES: No Known Allergies   PERTINENT MEDICATIONS:  Outpatient Encounter Medications as of 12/05/2017  Medication Sig  . CALCIUM-MAGNESIUM-ZINC PO Take 1 tablet by mouth 2 (two) times daily.  . carvedilol (COREG) 6.25 MG tablet Take 1 tablet (6.25 mg total) by mouth 2 (two) times daily with a meal.  . Cholecalciferol (VITAMIN D-3) 1000 units CAPS Take 1,000 Units by mouth daily.  . clomiPHENE (CLOMID) 50 MG tablet Take 25 mg by mouth daily.  . folic acid (FOLVITE) 025 MCG tablet Take 400 mcg by mouth daily.  . furosemide (LASIX) 20 MG tablet Take 1 tablet (20 mg total) by mouth every Monday, Wednesday, and Friday.  . gabapentin (NEURONTIN) 600 MG tablet Take 600 mg by mouth 3 (three) times daily as needed.   . hydrocortisone (CORTEF) 20 MG tablet Take 10-20 mg by mouth See admin instructions. Take 20 mg by mouth in the morning then 10 mg at noon(time) then 20 mg at bedtime  . Insulin Glargine (LANTUS) 100 UNIT/ML Solostar Pen Inject 26 Units into the skin at bedtime.  . lidocaine (LIDODERM) 5 % Place 1 patch onto the skin daily. Remove & Discard patch within 12 hours or as directed by MD  . megestrol (MEGACE) 400 MG/10ML suspension Take 10 mLs (400 mg total) by mouth daily.  . methocarbamol (ROBAXIN) 500 MG tablet Take 1 tablet (500 mg total) by mouth 3 (three) times daily.  . nitroGLYCERIN (NITROSTAT) 0.4 MG SL tablet PLACE 1 TABLET UNDER TONGUE EVERY 5 MINUTES AS NEEDED FOR CHEST PAIN (UP TO 3 DOSES) (Patient taking differently: Place 0.4 mg under the tongue every 5 (five) minutes as needed for chest pain. Up to 3 doses)  . ondansetron (ZOFRAN) 4 MG tablet Take 1 tablet (4 mg total) by mouth every 6 (six) hours as needed for nausea.  Marland Kitchen oxyCODONE (OXY IR/ROXICODONE) 5 MG immediate release tablet Take 1 tablet (5 mg total) by mouth every 4 (four) hours as needed for moderate  pain.  . OXYGEN Inhale 2 L into the lungs continuous.  . potassium chloride (K-DUR,KLOR-CON) 10 MEQ tablet Take 10 mEq by mouth daily.   . promethazine (PHENERGAN) 25 MG tablet Take 25 mg by mouth every 6 (six) hours as needed for nausea.   Marland Kitchen senna-docusate (SENOKOT-S) 8.6-50 MG tablet Take 2 tablets by mouth 2 (two) times daily.  Marland Kitchen thyroid (ARMOUR) 60 MG tablet Take 60 mg by mouth daily.   . traZODone (DESYREL) 100 MG tablet Take 100 mg by mouth at bedtime.   Marland Kitchen UNABLE TO FIND Take 1 tablet by mouth 2 (two) times daily. Life Extension Multivitamins  . vitamin B-12 (CYANOCOBALAMIN) 1000 MCG  tablet Take 1,000 mcg by mouth daily.  Marland Kitchen VITAMIN E PO Take 1 capsule by mouth daily.   Marland Kitchen warfarin (COUMADIN) 3 MG tablet TAKE AS DIRECTED BY COUMADIN CLINIC (Patient taking differently: Take 3 mg by mouth See admin instructions. Take 3 mg by mouth daily except take 1.5 mg by mouth on Sunday)   No facility-administered encounter medications on file as of 12/05/2017.     PHYSICAL EXAM:  VS afeb, 82-18 BP deferred.  General:painful, frail appearing, obese. Voices moderate back pain. Cardiovascular: S1S2 , regular rate and rhythm Pulmonary: clear all fields Abdomen: soft, non tender, + bowel sounds,endorses constipatoin Extremities: no edema, spinal pain causing LE weakness Skin: no rashes, wounds Neurological: Weakness, peripheral neuropathy,  oriented x 2-3 but Confused at times.  Cyndia Skeeters DNP, AGPCNP-BC

## 2017-12-05 NOTE — Telephone Encounter (Signed)
Returned a call to wife and she stated the pt is at a [facility] ([facility]) in [place] and they are managing the pt's INR. Advised that once he is discharged to call back so we can get him on the Anticoagulation schedule and she verbalized understanding.

## 2017-12-13 ENCOUNTER — Telehealth: Payer: Self-pay

## 2017-12-13 NOTE — Telephone Encounter (Signed)
Received phone call from patient's wife, Carlyon Shadow. Darlene shared that patient was placed at [facility] but signed himself out [place]. Darlene continues to seek placement for her husband and is interested in [facility]. Assisted wife with contact information.

## 2017-12-19 ENCOUNTER — Telehealth: Payer: Self-pay | Admitting: Cardiovascular Disease

## 2017-12-19 NOTE — Telephone Encounter (Signed)
°*  STAT* If patient is at the pharmacy, call can be transferred to refill team.   1. Which medications need to be refilled? (please list name of each medication and dose if known) warfarin (COUMADIN) 3 MG tablet  2. Which pharmacy/location (including street and city if local pharmacy) is medication to be sent to?[id number] - [place], [place] - [address]  3. Do they need a 30 day or 90 day supply? [place]

## 2017-12-19 NOTE — Telephone Encounter (Signed)
Returned call to pt as needs to schedule apt. Previously noted that in rehab and INR being managed by facility. Once reach pt to confirm/make appt will sent refill.

## 2017-12-20 DIAGNOSIS — I10 Essential (primary) hypertension: Secondary | ICD-10-CM | POA: Diagnosis not present

## 2017-12-20 DIAGNOSIS — I509 Heart failure, unspecified: Secondary | ICD-10-CM | POA: Diagnosis not present

## 2017-12-20 DIAGNOSIS — M179 Osteoarthritis of knee, unspecified: Secondary | ICD-10-CM | POA: Diagnosis not present

## 2017-12-20 DIAGNOSIS — D649 Anemia, unspecified: Secondary | ICD-10-CM | POA: Diagnosis not present

## 2017-12-20 DIAGNOSIS — J45909 Unspecified asthma, uncomplicated: Secondary | ICD-10-CM | POA: Diagnosis not present

## 2017-12-20 DIAGNOSIS — N183 Chronic kidney disease, stage 3 (moderate): Secondary | ICD-10-CM | POA: Diagnosis not present

## 2017-12-20 DIAGNOSIS — N400 Enlarged prostate without lower urinary tract symptoms: Secondary | ICD-10-CM | POA: Diagnosis not present

## 2017-12-20 DIAGNOSIS — F0390 Unspecified dementia without behavioral disturbance: Secondary | ICD-10-CM | POA: Diagnosis not present

## 2017-12-20 DIAGNOSIS — E039 Hypothyroidism, unspecified: Secondary | ICD-10-CM | POA: Diagnosis not present

## 2017-12-20 MED ORDER — WARFARIN SODIUM 3 MG PO TABS: ORAL_TABLET | ORAL | 0 refills | Status: AC

## 2017-12-20 NOTE — Telephone Encounter (Signed)
Spoke with wife and she reports that patient left facility AMA. She reports that he is worse than he has ever been. His INR was 2.3 last week before he left. She is working to get him admitted to [facility] currently and should know something by later this week. She only has 3 tablets of warfarin in the home.   She is unable to lift him to get him into the car to bring him for a visit. They do not currently have nursing coming to the home to help or that can check an INR.   Will send for a month supply of warfarin. Advised wife to call and keep [place] updated with [facility] admission. She is aware that will need INR check before additional refills will be able to be sent.

## 2017-12-31 ENCOUNTER — Emergency Department (HOSPITAL_COMMUNITY): Payer: Medicare HMO

## 2017-12-31 ENCOUNTER — Inpatient Hospital Stay (HOSPITAL_COMMUNITY)
Admission: EM | Admit: 2017-12-31 | Discharge: 2018-01-10 | DRG: 871 | Disposition: A | Discharge status: Skilled Nursing Facility | Payer: Medicare HMO | Attending: Internal Medicine | Admitting: Internal Medicine

## 2017-12-31 ENCOUNTER — Other Ambulatory Visit: Payer: Self-pay

## 2017-12-31 ENCOUNTER — Encounter (HOSPITAL_COMMUNITY): Payer: Self-pay | Admitting: Emergency Medicine

## 2017-12-31 DIAGNOSIS — R531 Weakness: Secondary | ICD-10-CM | POA: Diagnosis not present

## 2017-12-31 DIAGNOSIS — Z9861 Coronary angioplasty status: Secondary | ICD-10-CM | POA: Diagnosis not present

## 2017-12-31 DIAGNOSIS — L03115 Cellulitis of right lower limb: Secondary | ICD-10-CM | POA: Diagnosis present

## 2017-12-31 DIAGNOSIS — F0390 Unspecified dementia without behavioral disturbance: Secondary | ICD-10-CM | POA: Diagnosis present

## 2017-12-31 DIAGNOSIS — E860 Dehydration: Secondary | ICD-10-CM

## 2017-12-31 DIAGNOSIS — F329 Major depressive disorder, single episode, unspecified: Secondary | ICD-10-CM | POA: Diagnosis present

## 2017-12-31 DIAGNOSIS — I9589 Other hypotension: Secondary | ICD-10-CM | POA: Diagnosis not present

## 2017-12-31 DIAGNOSIS — M479 Spondylosis, unspecified: Secondary | ICD-10-CM | POA: Diagnosis not present

## 2017-12-31 DIAGNOSIS — E1122 Type 2 diabetes mellitus with diabetic chronic kidney disease: Secondary | ICD-10-CM | POA: Diagnosis present

## 2017-12-31 DIAGNOSIS — R6521 Severe sepsis with septic shock: Secondary | ICD-10-CM | POA: Diagnosis present

## 2017-12-31 DIAGNOSIS — L89210 Pressure ulcer of right hip, unstageable: Secondary | ICD-10-CM | POA: Diagnosis present

## 2017-12-31 DIAGNOSIS — Z7901 Long term (current) use of anticoagulants: Secondary | ICD-10-CM

## 2017-12-31 DIAGNOSIS — Z955 Presence of coronary angioplasty implant and graft: Secondary | ICD-10-CM | POA: Diagnosis not present

## 2017-12-31 DIAGNOSIS — J449 Chronic obstructive pulmonary disease, unspecified: Secondary | ICD-10-CM | POA: Diagnosis not present

## 2017-12-31 DIAGNOSIS — L03319 Cellulitis of trunk, unspecified: Secondary | ICD-10-CM | POA: Diagnosis not present

## 2017-12-31 DIAGNOSIS — Z7952 Long term (current) use of systemic steroids: Secondary | ICD-10-CM

## 2017-12-31 DIAGNOSIS — E039 Hypothyroidism, unspecified: Secondary | ICD-10-CM | POA: Diagnosis present

## 2017-12-31 DIAGNOSIS — M4805 Spinal stenosis, thoracolumbar region: Secondary | ICD-10-CM | POA: Diagnosis not present

## 2017-12-31 DIAGNOSIS — I2510 Atherosclerotic heart disease of native coronary artery without angina pectoris: Secondary | ICD-10-CM | POA: Diagnosis not present

## 2017-12-31 DIAGNOSIS — D631 Anemia in chronic kidney disease: Secondary | ICD-10-CM | POA: Diagnosis not present

## 2017-12-31 DIAGNOSIS — E079 Disorder of thyroid, unspecified: Secondary | ICD-10-CM | POA: Diagnosis not present

## 2017-12-31 DIAGNOSIS — Z794 Long term (current) use of insulin: Secondary | ICD-10-CM

## 2017-12-31 DIAGNOSIS — Z7401 Bed confinement status: Secondary | ICD-10-CM

## 2017-12-31 DIAGNOSIS — Z9981 Dependence on supplemental oxygen: Secondary | ICD-10-CM

## 2017-12-31 DIAGNOSIS — Z8711 Personal history of peptic ulcer disease: Secondary | ICD-10-CM

## 2017-12-31 DIAGNOSIS — L0390 Cellulitis, unspecified: Secondary | ICD-10-CM | POA: Diagnosis not present

## 2017-12-31 DIAGNOSIS — I130 Hypertensive heart and chronic kidney disease with heart failure and stage 1 through stage 4 chronic kidney disease, or unspecified chronic kidney disease: Secondary | ICD-10-CM | POA: Diagnosis present

## 2017-12-31 DIAGNOSIS — A419 Sepsis, unspecified organism: Secondary | ICD-10-CM | POA: Diagnosis not present

## 2017-12-31 DIAGNOSIS — I5032 Chronic diastolic (congestive) heart failure: Secondary | ICD-10-CM | POA: Diagnosis present

## 2017-12-31 DIAGNOSIS — S299XXA Unspecified injury of thorax, initial encounter: Secondary | ICD-10-CM | POA: Diagnosis not present

## 2017-12-31 DIAGNOSIS — L97529 Non-pressure chronic ulcer of other part of left foot with unspecified severity: Secondary | ICD-10-CM

## 2017-12-31 DIAGNOSIS — L8990 Pressure ulcer of unspecified site, unspecified stage: Secondary | ICD-10-CM

## 2017-12-31 DIAGNOSIS — N183 Chronic kidney disease, stage 3 unspecified: Secondary | ICD-10-CM | POA: Diagnosis present

## 2017-12-31 DIAGNOSIS — Z86711 Personal history of pulmonary embolism: Secondary | ICD-10-CM

## 2017-12-31 DIAGNOSIS — F321 Major depressive disorder, single episode, moderate: Secondary | ICD-10-CM | POA: Diagnosis not present

## 2017-12-31 DIAGNOSIS — R4020 Unspecified coma: Secondary | ICD-10-CM | POA: Diagnosis not present

## 2017-12-31 DIAGNOSIS — E876 Hypokalemia: Secondary | ICD-10-CM | POA: Diagnosis not present

## 2017-12-31 DIAGNOSIS — M2550 Pain in unspecified joint: Secondary | ICD-10-CM | POA: Diagnosis not present

## 2017-12-31 DIAGNOSIS — K219 Gastro-esophageal reflux disease without esophagitis: Secondary | ICD-10-CM | POA: Diagnosis present

## 2017-12-31 DIAGNOSIS — R52 Pain, unspecified: Secondary | ICD-10-CM | POA: Diagnosis not present

## 2017-12-31 DIAGNOSIS — F32A Depression, unspecified: Secondary | ICD-10-CM | POA: Diagnosis present

## 2017-12-31 DIAGNOSIS — R0902 Hypoxemia: Secondary | ICD-10-CM | POA: Diagnosis not present

## 2017-12-31 DIAGNOSIS — E271 Primary adrenocortical insufficiency: Secondary | ICD-10-CM | POA: Diagnosis present

## 2017-12-31 DIAGNOSIS — L03116 Cellulitis of left lower limb: Secondary | ICD-10-CM | POA: Diagnosis not present

## 2017-12-31 DIAGNOSIS — N189 Chronic kidney disease, unspecified: Secondary | ICD-10-CM

## 2017-12-31 DIAGNOSIS — Z79899 Other long term (current) drug therapy: Secondary | ICD-10-CM

## 2017-12-31 DIAGNOSIS — L89892 Pressure ulcer of other site, stage 2: Secondary | ICD-10-CM | POA: Diagnosis not present

## 2017-12-31 LAB — CBC WITH DIFFERENTIAL/PLATELET
Abs Immature Granulocytes: 0.09 10*3/uL — ABNORMAL HIGH (ref 0.00–0.07)
Basophils Absolute: 0 10*3/uL (ref 0.0–0.1)
Basophils Relative: 0 %
Eosinophils Absolute: 0.2 10*3/uL (ref 0.0–0.5)
Eosinophils Relative: 1 %
HCT: 46.8 % (ref 39.0–52.0)
Hemoglobin: 14.6 g/dL (ref 13.0–17.0)
Immature Granulocytes: 1 %
Lymphocytes Relative: 14 %
Lymphs Abs: 2.5 10*3/uL (ref 0.7–4.0)
MCH: 24.8 pg — ABNORMAL LOW (ref 26.0–34.0)
MCHC: 31.2 g/dL (ref 30.0–36.0)
MCV: 79.5 fL — ABNORMAL LOW (ref 80.0–100.0)
Monocytes Absolute: 1.7 10*3/uL — ABNORMAL HIGH (ref 0.1–1.0)
Monocytes Relative: 10 %
NEUTROS PCT: 74 %
Neutro Abs: 12.9 10*3/uL — ABNORMAL HIGH (ref 1.7–7.7)
Platelets: 344 10*3/uL (ref 150–400)
RBC: 5.89 MIL/uL — ABNORMAL HIGH (ref 4.22–5.81)
RDW: 19.3 % — ABNORMAL HIGH (ref 11.5–15.5)
WBC: 17.4 10*3/uL — ABNORMAL HIGH (ref 4.0–10.5)
nRBC: 0 % (ref 0.0–0.2)

## 2017-12-31 LAB — COMPREHENSIVE METABOLIC PANEL
ALBUMIN: 2.7 g/dL — AB (ref 3.5–5.0)
ALK PHOS: 98 U/L (ref 38–126)
ALT: 65 U/L — ABNORMAL HIGH (ref 0–44)
AST: 53 U/L — AB (ref 15–41)
Anion gap: 13 (ref 5–15)
BILIRUBIN TOTAL: 1.3 mg/dL — AB (ref 0.3–1.2)
BUN: 21 mg/dL (ref 8–23)
CALCIUM: 8.6 mg/dL — AB (ref 8.9–10.3)
CO2: 21 mmol/L — ABNORMAL LOW (ref 22–32)
CREATININE: 1.38 mg/dL — AB (ref 0.61–1.24)
Chloride: 103 mmol/L (ref 98–111)
GFR calc Af Amer: 57 mL/min — ABNORMAL LOW (ref >60)
GFR, EST NON AFRICAN AMERICAN: 49 mL/min — AB (ref >60)
GLUCOSE: 113 mg/dL — AB (ref 70–99)
POTASSIUM: 3.5 mmol/L (ref 3.5–5.1)
SODIUM: 137 mmol/L (ref 135–145)
TOTAL PROTEIN: 6.7 g/dL (ref 6.5–8.1)

## 2017-12-31 LAB — I-STAT CG4 LACTIC ACID, ED: LACTIC ACID, VENOUS: 2.33 mmol/L — AB (ref 0.5–1.9)

## 2017-12-31 LAB — URINALYSIS, COMPLETE (UACMP) WITH MICROSCOPIC
Bacteria, UA: NONE SEEN
Bilirubin Urine: NEGATIVE
GLUCOSE, UA: NEGATIVE mg/dL
Hgb urine dipstick: NEGATIVE
Ketones, ur: NEGATIVE mg/dL
Leukocytes, UA: NEGATIVE
Nitrite: NEGATIVE
PH: 5 (ref 5.0–8.0)
Protein, ur: NEGATIVE mg/dL
Specific Gravity, Urine: 1.017 (ref 1.005–1.030)

## 2017-12-31 LAB — PROTIME-INR
INR: 4.34 — AB
Prothrombin Time: 40.9 seconds — ABNORMAL HIGH (ref 11.4–15.2)

## 2017-12-31 LAB — CK: Total CK: 751 U/L — ABNORMAL HIGH (ref 49–397)

## 2017-12-31 LAB — CBG MONITORING, ED: GLUCOSE-CAPILLARY: 110 mg/dL — AB (ref 70–99)

## 2017-12-31 LAB — GLUCOSE, CAPILLARY: Glucose-Capillary: 138 mg/dL — ABNORMAL HIGH (ref 70–99)

## 2017-12-31 LAB — MRSA PCR SCREENING: MRSA BY PCR: POSITIVE — AB

## 2017-12-31 MED ORDER — INSULIN ASPART 100 UNIT/ML ~~LOC~~ SOLN: 0.0000 [IU] | Freq: Every day | SUBCUTANEOUS | Status: DC

## 2017-12-31 MED ORDER — VANCOMYCIN HCL 10 G IV SOLR
2000.0000 mg | Freq: Once | INTRAVENOUS | Status: AC
  Administered 2017-12-31: 2000 mg via INTRAVENOUS
  Filled 2017-12-31: qty 2000

## 2017-12-31 MED ORDER — GABAPENTIN 600 MG PO TABS
600.0000 mg | ORAL_TABLET | Freq: Three times a day (TID) | ORAL | Status: DC | PRN
  Administered 2018-01-03: 600 mg via ORAL
  Filled 2017-12-31: qty 1

## 2017-12-31 MED ORDER — NITROGLYCERIN 0.4 MG SL SUBL: 0.4000 mg | SUBLINGUAL_TABLET | SUBLINGUAL | Status: DC | PRN

## 2017-12-31 MED ORDER — SENNOSIDES-DOCUSATE SODIUM 8.6-50 MG PO TABS
2.0000 | ORAL_TABLET | Freq: Two times a day (BID) | ORAL | Status: DC
  Administered 2017-12-31 – 2018-01-09 (×18): 2 via ORAL
  Filled 2017-12-31 (×18): qty 2

## 2017-12-31 MED ORDER — PIPERACILLIN-TAZOBACTAM 3.375 G IVPB
3.3750 g | Freq: Once | INTRAVENOUS | Status: AC
  Administered 2017-12-31: 3.375 g via INTRAVENOUS
  Filled 2017-12-31: qty 50

## 2017-12-31 MED ORDER — SODIUM CHLORIDE 0.9 % IV SOLN
INTRAVENOUS | Status: DC
  Administered 2017-12-31 – 2018-01-07 (×7): via INTRAVENOUS

## 2017-12-31 MED ORDER — CHLORHEXIDINE GLUCONATE CLOTH 2 % EX PADS
6.0000 | MEDICATED_PAD | Freq: Every day | CUTANEOUS | Status: AC
  Administered 2018-01-01 – 2018-01-05 (×3): 6 via TOPICAL

## 2017-12-31 MED ORDER — FOLIC ACID 400 MCG PO TABS: 400.0000 ug | ORAL_TABLET | Freq: Every day | ORAL | Status: DC

## 2017-12-31 MED ORDER — MEGESTROL ACETATE 400 MG/10ML PO SUSP
400.0000 mg | Freq: Every day | ORAL | Status: DC
  Filled 2017-12-31 (×2): qty 10

## 2017-12-31 MED ORDER — VITAMIN B-12 1000 MCG PO TABS
1000.0000 ug | ORAL_TABLET | Freq: Every day | ORAL | Status: DC
  Administered 2018-01-01 – 2018-01-10 (×10): 1000 ug via ORAL
  Filled 2017-12-31 (×10): qty 1

## 2017-12-31 MED ORDER — INSULIN GLARGINE 100 UNIT/ML ~~LOC~~ SOLN
26.0000 [IU] | Freq: Every day | SUBCUTANEOUS | Status: DC
  Administered 2017-12-31 – 2018-01-07 (×6): 26 [IU] via SUBCUTANEOUS
  Filled 2017-12-31 (×12): qty 0.26

## 2017-12-31 MED ORDER — ONDANSETRON HCL 4 MG/2ML IJ SOLN
4.0000 mg | Freq: Four times a day (QID) | INTRAMUSCULAR | Status: DC | PRN
  Administered 2018-01-05: 4 mg via INTRAVENOUS
  Filled 2017-12-31: qty 2

## 2017-12-31 MED ORDER — ONDANSETRON HCL 4 MG PO TABS: 4.0000 mg | ORAL_TABLET | Freq: Four times a day (QID) | ORAL | Status: DC | PRN

## 2017-12-31 MED ORDER — VANCOMYCIN HCL IN DEXTROSE 1-5 GM/200ML-% IV SOLN
1000.0000 mg | Freq: Once | INTRAVENOUS | Status: DC
  Filled 2017-12-31: qty 200

## 2017-12-31 MED ORDER — PIPERACILLIN-TAZOBACTAM 3.375 G IVPB
3.3750 g | Freq: Three times a day (TID) | INTRAVENOUS | Status: DC
  Administered 2017-12-31 – 2018-01-07 (×20): 3.375 g via INTRAVENOUS
  Filled 2017-12-31 (×21): qty 50

## 2017-12-31 MED ORDER — SODIUM CHLORIDE 0.9 % IV SOLN
INTRAVENOUS | Status: DC
  Administered 2017-12-31: 15:00:00 via INTRAVENOUS

## 2017-12-31 MED ORDER — MUPIROCIN 2 % EX OINT
1.0000 "application " | TOPICAL_OINTMENT | Freq: Two times a day (BID) | CUTANEOUS | Status: AC
  Administered 2017-12-31 – 2018-01-05 (×10): 1 via NASAL
  Filled 2017-12-31 (×2): qty 22

## 2017-12-31 MED ORDER — WARFARIN - PHARMACIST DOSING INPATIENT
Freq: Every day | Status: DC
  Administered 2018-01-08: 18:00:00

## 2017-12-31 MED ORDER — HYDROCORTISONE NA SUCCINATE PF 100 MG IJ SOLR
100.0000 mg | Freq: Three times a day (TID) | INTRAMUSCULAR | Status: DC
  Administered 2018-01-01 – 2018-01-02 (×5): 100 mg via INTRAVENOUS
  Filled 2017-12-31 (×5): qty 2

## 2017-12-31 MED ORDER — THYROID 60 MG PO TABS
60.0000 mg | ORAL_TABLET | Freq: Every day | ORAL | Status: DC
  Administered 2018-01-01 – 2018-01-10 (×10): 60 mg via ORAL
  Filled 2017-12-31 (×10): qty 1

## 2017-12-31 MED ORDER — FOLIC ACID 1 MG PO TABS
0.5000 mg | ORAL_TABLET | Freq: Every day | ORAL | Status: DC
  Administered 2018-01-01 – 2018-01-10 (×10): 0.5 mg via ORAL
  Filled 2017-12-31 (×10): qty 1

## 2017-12-31 MED ORDER — OXYCODONE HCL 5 MG PO TABS
5.0000 mg | ORAL_TABLET | ORAL | Status: DC | PRN
  Administered 2018-01-01 – 2018-01-10 (×18): 5 mg via ORAL
  Filled 2017-12-31 (×21): qty 1

## 2017-12-31 MED ORDER — CLINDAMYCIN PHOSPHATE 600 MG/50ML IV SOLN
600.0000 mg | Freq: Once | INTRAVENOUS | Status: DC
  Filled 2017-12-31: qty 50

## 2017-12-31 MED ORDER — SODIUM CHLORIDE 0.9 % IV BOLUS
1000.0000 mL | Freq: Once | INTRAVENOUS | Status: AC
  Administered 2017-12-31: 1000 mL via INTRAVENOUS

## 2017-12-31 MED ORDER — METHOCARBAMOL 500 MG PO TABS
500.0000 mg | ORAL_TABLET | Freq: Three times a day (TID) | ORAL | Status: DC
  Administered 2017-12-31 – 2018-01-10 (×30): 500 mg via ORAL
  Filled 2017-12-31 (×30): qty 1

## 2017-12-31 MED ORDER — VANCOMYCIN HCL IN DEXTROSE 1-5 GM/200ML-% IV SOLN
1000.0000 mg | INTRAVENOUS | Status: DC
  Administered 2018-01-01: 1000 mg via INTRAVENOUS
  Filled 2017-12-31 (×2): qty 200

## 2017-12-31 MED ORDER — TRAZODONE HCL 100 MG PO TABS
100.0000 mg | ORAL_TABLET | Freq: Every day | ORAL | Status: DC
  Administered 2017-12-31 – 2018-01-09 (×10): 100 mg via ORAL
  Filled 2017-12-31 (×10): qty 1

## 2017-12-31 MED ORDER — INSULIN ASPART 100 UNIT/ML ~~LOC~~ SOLN
0.0000 [IU] | Freq: Three times a day (TID) | SUBCUTANEOUS | Status: DC
  Administered 2018-01-01: 3 [IU] via SUBCUTANEOUS
  Administered 2018-01-01 – 2018-01-07 (×5): 1 [IU] via SUBCUTANEOUS

## 2017-12-31 MED ORDER — CARVEDILOL 6.25 MG PO TABS
6.2500 mg | ORAL_TABLET | Freq: Two times a day (BID) | ORAL | Status: DC
  Administered 2017-12-31 – 2018-01-10 (×20): 6.25 mg via ORAL
  Filled 2017-12-31 (×20): qty 1

## 2017-12-31 NOTE — ED Provider Notes (Signed)
Nursing staff asked if I could take pictures of wounds prior to dressings being applied as Dr. Vanita Panda was in with a critical patient. Below are wounds to bilateral feet, right buttocks and right torso.               [name], Dennis Almond, PA-C 12/31/17 1437    Carmin Muskrat, MD 12/31/17 [phone]

## 2017-12-31 NOTE — ED Triage Notes (Signed)
PER EMS- pt picked up from home, reports wife called EMS stating that she is unable to take care of Pt. Pt was laying on side for 5days. Arrived to ED alert and orientedx3.  Multiple pressure sores present.

## 2017-12-31 NOTE — Progress Notes (Signed)
Pharmacy Antibiotic Note  [name] is a 78 y.o. male admitted on 12/31/2017 with sepsis.  Pharmacy has been consulted for vancomycin and Zosyn dosing.  Patient has cellulitis on ankle.  Plan: Give vancomycin 2g IV x 1, then start vancomycin 1g IV Q24h Start Zosyn 3.375 gm IV q8h (4 hour infusion) Monitor clinical picture, renal function, vanc levels prn F/U C&S, abx deescalation / LOT   Height: 5\' 10"  (177.8 cm) Weight: 210 lb (95.3 kg) IBW/kg (Calculated) : 73  Temp (24hrs), Avg:98.4 F (36.9 C), Min:98.4 F (36.9 C), Max:98.4 F (36.9 C)  Recent Labs  Lab 12/31/17 1253 12/31/17 1307  WBC 17.4*  --   CREATININE 1.38*  --   LATICACIDVEN  --  2.33*    Estimated Creatinine Clearance: 52.8 mL/min (A) (by C-G formula based on SCr of 1.38 mg/dL (H)).    No Known Allergies   Thank you for allowing pharmacy to be a part of this patient's care.  Reginia Naas 12/31/2017 3:03 PM

## 2017-12-31 NOTE — H&P (Signed)
[facility]                                                                                    Patient Demographics  [name], is a 77 y.o. male  CSN: [id number]  MRN: [id number]  DOB - 02-24-1940  Admit Date - 12/31/2017  Outpatient Primary MD for the patient is Lawerance Cruel, MD   With History of -  Past Medical History:  Diagnosis Date  . Acute lower GI bleeding    a. admitted to [facility] 04/11/2013;  b. 04/2014 EGD: 1.  gastric erosions, mild prox gastritis and bulbar duodenitis->Protonix.  . Addison's disease ([place])   . Anemia   . Anemia in CKD (chronic kidney disease) 06/25/2015  . Chronic back pain   . Chronic diastolic CHF (congestive heart failure) ([place])    a. His initial ejection fraction was between 30 and 35%;  b. 04/2013 Echo: EF 50-55% Gr1 DD, mild-mod MR;  c. 04/2014 Echo: EF 60-65%, Gr 1 DD, triv TR.  . CKD (chronic kidney disease), stage III ([place])   . Coronary artery disease    a. 07/2014 - s/p difficult PCI - had pressure wire analysis of LAD s/p DES in mid segment c/b wire-induced dissection in the distal segment of the LAD which was treated with repeated balloon inflations. Also had DES to rPDA - again had either spasm distal to the stent placement or an edge dissection but the vessel was small in that area; procedure aborted.  . Daily headache   . GERD (gastroesophageal reflux disease)   . H/O hiatal hernia   . Hepatitis    a. 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  . History of aortic insufficiency   . Hx of cardiovascular stress test    a. ETT-Myoview 7/14:  Low risk, inferior defect consistent with thinning, no ischemia, normal wall motion, EF 54%  . Hypertension   . Hypogonadism male   . Obesity   . Peptic ulcer disease   . Pulmonary embolism ([place])    a. 04/2014 CTA: Bilat submassive PE ->coumadin.  . Pulmonary HTN ([place])   . Thyroid disease       Past Surgical History:  Procedure Laterality Date  . APPENDECTOMY  1950's  . CARDIAC CATHETERIZATION   06/26/2008   EF 30-35%  . CARDIAC CATHETERIZATION N/A 08/20/2014   Procedure: Right/Left Heart Cath and Coronary Angiography;  Surgeon: Jolaine Artist, MD;  Location: [facility];  Service: Cardiovascular;  Laterality: N/A;  . CARDIAC CATHETERIZATION N/A 08/21/2014   Procedure: Coronary Stent Intervention;  Surgeon: Wellington Hampshire, MD;  Location: [facility];  Service: Cardiovascular;  Laterality: N/A;  . CARDIAC CATHETERIZATION N/A 11/24/2015   Procedure: Right/Left Heart Cath and Coronary Angiography;  Surgeon: Jolaine Artist, MD;  Location: [facility];  Service: Cardiovascular;  Laterality: N/A;  . CHOLECYSTECTOMY  ~ 1965  . ESOPHAGOGASTRODUODENOSCOPY N/A 04/11/2013   Procedure: ESOPHAGOGASTRODUODENOSCOPY (EGD);  Surgeon: Missy Sabins, MD;  Location: [facility];  Service: Endoscopy;  Laterality: N/A;  . ESOPHAGOGASTRODUODENOSCOPY N/A 05/13/2014   Procedure: ESOPHAGOGASTRODUODENOSCOPY (EGD);  Surgeon: Arta Silence, MD;  Location: [facility] ENDOSCOPY;  Service: Endoscopy;  Laterality: N/A;  . TONSILLECTOMY  1940's  . US ECHOCARDIOGRAPHY  04/09/2010   EF 60-65%    in for   Chief Complaint  Patient presents with  . Weakness  . Fatigue     HPI  [name]  is a 77 y.o. male, with past medical history significant for diabetes mellitus, diastolic congestive heart failure and coronary artery disease in addition to Addison's disease on chronic p.o. steroids presenting today with worsening weakness and infected ulcers on his lower extremities and back (see physician assistant pictures).  The patient was a nursing home resident until 2 weeks ago when his wife decided to take him home and try to take care of him.  However in the last few days it has become harder for her to even move him.  Patient denies any chest pains, shortness of breath, nausea, vomiting or diarrhea.  He is slightly confused according to the wife and not getting any better.  His wife was at bedside  and was very concerned.  Patient denies any history of urinary symptoms however she noticed that his urine output has decreased significantly lately.  No report of any fever or chills at home however patient is on steroids.  His wife was in the process of trying to get him into the [facility] facility for physical therapy .  His work-up in the emergency room showed a negative chest x-ray and his urinalysis is pending.  His skin ulcers were noted with redness and cellulitic changes noted.  His white blood cell count was elevated knowing that the patient is on chronic steroids.  No fever was noted, but his lactic acid was elevated.    Review of Systems    In addition to the HPI above,  No Fever-chills, No Headache, No changes with Vision or hearing, No problems swallowing food or Liquids, No Chest pain, Cough or Shortness of Breath, No Abdominal pain, No Nausea or Vommitting, Bowel movements are regular, No Blood in stool or Urine, No dysuria, No new joints pains-aches,  No new weakness, tingling, numbness in any extremity, No recent weight gain or loss, No polyuria, polydypsia or polyphagia, No significant Mental Stressors.  A full 10 point Review of Systems was done, except as stated above, all other Review of Systems were negative.   Social History Social History   Tobacco Use  . Smoking status: Never Smoker  . Smokeless tobacco: Never Used  Substance Use Topics  . Alcohol use: No    Alcohol/week: 0.0 standard drinks     Family History Family History  Problem Relation Age of Onset  . Clotting disorder Mother   . Clotting disorder Brother   . Clotting disorder Brother   . Clotting disorder Brother   . Clotting disorder Other        Niece.  Marland Kitchen Heart attack Neg Hx      Prior to Admission medications   Medication Sig Start Date End Date Taking? Authorizing Provider  CALCIUM-MAGNESIUM-ZINC PO Take 1 tablet by mouth 2 (two) times daily.    [provider]   carvedilol (COREG) 6.25 MG tablet Take 1 tablet (6.25 mg total) by mouth 2 (two) times daily with a meal. 01/26/17   Larey Dresser, MD  Cholecalciferol (VITAMIN D-3) 1000 units CAPS Take 1,000 Units by mouth daily.    [provider]  clomiPHENE (CLOMID) 50 MG tablet Take 25 mg by mouth daily. 10/05/17   [provider]  folic acid (FOLVITE)  400 MCG tablet Take 400 mcg by mouth daily.    [provider]  furosemide (LASIX) 20 MG tablet Take 1 tablet (20 mg total) by mouth every Monday, Wednesday, and Friday. 12/02/17 12/02/18  Roxan Hockey, MD  gabapentin (NEURONTIN) 600 MG tablet Take 600 mg by mouth 3 (three) times daily as needed.     [provider]  hydrocortisone (CORTEF) 20 MG tablet Take 10-20 mg by mouth See admin instructions. Take 20 mg by mouth in the morning then 10 mg at noon(time) then 20 mg at bedtime    [provider]  Insulin Glargine (LANTUS) 100 UNIT/ML Solostar Pen Inject 26 Units into the skin at bedtime. 12/01/17   Roxan Hockey, MD  lidocaine (LIDODERM) 5 % Place 1 patch onto the skin daily. Remove & Discard patch within 12 hours or as directed by MD 12/01/17   Roxan Hockey, MD  megestrol (MEGACE) 400 MG/10ML suspension Take 10 mLs (400 mg total) by mouth daily. 12/01/17   Roxan Hockey, MD  methocarbamol (ROBAXIN) 500 MG tablet Take 1 tablet (500 mg total) by mouth 3 (three) times daily. 12/01/17   Roxan Hockey, MD  nitroGLYCERIN (NITROSTAT) 0.4 MG SL tablet PLACE 1 TABLET UNDER TONGUE EVERY 5 MINUTES AS NEEDED FOR CHEST PAIN (UP TO 3 DOSES) Patient taking differently: Place 0.4 mg under the tongue every 5 (five) minutes as needed for chest pain. Up to 3 doses 03/22/16   [name], Wonda Cheng, MD  ondansetron (ZOFRAN) 4 MG tablet Take 1 tablet (4 mg total) by mouth every 6 (six) hours as needed for nausea. 12/01/17   Roxan Hockey, MD  oxyCODONE (OXY IR/ROXICODONE) 5 MG immediate release tablet Take 1 tablet (5 mg total)  by mouth every 4 (four) hours as needed for moderate pain. 12/01/17   Roxan Hockey, MD  OXYGEN Inhale 2 L into the lungs continuous.    [provider]  potassium chloride (K-DUR,KLOR-CON) 10 MEQ tablet Take 10 mEq by mouth daily.     [provider]  promethazine (PHENERGAN) 25 MG tablet Take 25 mg by mouth every 6 (six) hours as needed for nausea.  05/17/14   [provider]  senna-docusate (SENOKOT-S) 8.6-50 MG tablet Take 2 tablets by mouth 2 (two) times daily. 12/01/17 12/01/18  Roxan Hockey, MD  thyroid (ARMOUR) 60 MG tablet Take 60 mg by mouth daily.     [provider]  traZODone (DESYREL) 100 MG tablet Take 100 mg by mouth at bedtime.  04/16/14   [provider]  UNABLE TO FIND Take 1 tablet by mouth 2 (two) times daily. Life Extension Multivitamins    [provider]  vitamin B-12 (CYANOCOBALAMIN) 1000 MCG tablet Take 1,000 mcg by mouth daily.    [provider]  VITAMIN E PO Take 1 capsule by mouth daily.     [provider]  warfarin (COUMADIN) 3 MG tablet TAKE AS DIRECTED BY COUMADIN CLINIC 12/20/17   [name], Wonda Cheng, MD    No Known Allergies  Physical Exam  Vitals  Blood pressure 96/76, pulse 91, temperature 98.4 F (36.9 C), temperature source Oral, resp. rate (!) 28, height 5\' 10"  (1.778 m), weight 95.3 kg, SpO2 99 %.   1. General well-developed, well-nourished gentleman, extremely pleasant  2. Normal affect and insight, Not Suicidal or Homicidal, pleasantly confused.  3. No F.N deficits, grossly, patient moving all extremities.  4. Ears and Eyes appear Normal, Conjunctivae clear, PERRLA. Moist Oral Mucosa.  5. Supple Neck, No  JVD, No cervical lymphadenopathy appriciated, No Carotid Bruits.  6. Symmetrical Chest wall movement, Good air movement bilaterally, CTAB.  7. RRR, No Gallops, systolic murmur 2/6.  8. Positive Bowel Sounds, Abdomen Soft, Non tender, .  9.  No Cyanosis, Normal Skin  Turgor, multiple skin ulcers noted, see pictures.  10. Good muscle tone,  joints appear normal , no effusions, Normal ROM.    Data Review  CBC Recent Labs  Lab 12/31/17 1253  WBC 17.4*  HGB 14.6  HCT 46.8  PLT 344  MCV 79.5*  MCH 24.8*  MCHC 31.2  RDW 19.3*  LYMPHSABS 2.5  MONOABS 1.7*  EOSABS 0.2  BASOSABS 0.0   ------------------------------------------------------------------------------------------------------------------  Chemistries  Recent Labs  Lab 12/31/17 1253  NA 137  K 3.5  CL 103  CO2 21*  GLUCOSE 113*  BUN 21  CREATININE 1.38*  CALCIUM 8.6*  AST 53*  ALT 65*  ALKPHOS 98  BILITOT 1.3*   ------------------------------------------------------------------------------------------------------------------ estimated creatinine clearance is 52.8 mL/min (A) (by C-G formula based on SCr of 1.38 mg/dL (H)). ------------------------------------------------------------------------------------------------------------------ No results for input(s): TSH, T4TOTAL, T3FREE, THYROIDAB in the last 72 hours.  Invalid input(s): FREET3   Coagulation profile Recent Labs  Lab 12/31/17 1253  INR 4.34*   ------------------------------------------------------------------------------------------------------------------- No results for input(s): DDIMER in the last 72 hours. -------------------------------------------------------------------------------------------------------------------  Cardiac Enzymes No results for input(s): CKMB, TROPONINI, MYOGLOBIN in the last 168 hours.  Invalid input(s): CK ------------------------------------------------------------------------------------------------------------------ Invalid input(s): POCBNP   ---------------------------------------------------------------------------------------------------------------  Urinalysis    Component Value Date/Time   COLORURINE YELLOW 12/31/2017 [place] 12/31/2017  1253   LABSPEC 1.017 12/31/2017 1253   PHURINE 5.0 12/31/2017 1253   GLUCOSEU NEGATIVE 12/31/2017 1253   HGBUR NEGATIVE 12/31/2017 1253   BILIRUBINUR NEGATIVE 12/31/2017 1253   KETONESUR NEGATIVE 12/31/2017 1253   PROTEINUR NEGATIVE 12/31/2017 1253   UROBILINOGEN 0.2 03/21/2007 1015   NITRITE NEGATIVE 12/31/2017 1253   LEUKOCYTESUR NEGATIVE 12/31/2017 1253    ----------------------------------------------------------------------------------------------------------------   Imaging results:   Dg Chest Port 1 View  Result Date: 12/31/2017 CLINICAL DATA:  Altered level of consciousness, status fall EXAM: PORTABLE CHEST 1 VIEW COMPARISON:  None. FINDINGS: The heart size and mediastinal contours are within normal limits. Both lungs are clear. The visualized skeletal structures are unremarkable. IMPRESSION: No active disease. Electronically Signed   By: [name]   On: 12/31/2017 13:38    My personal review of EKG: Rhythm NSR, 89 bpm with nonspecific ST and T wave changes  Assessment & Plan  Sepsis with elevated lactic acid and white blood cell count Patient has chronic hypotension due to Addison's disease.  However patient is on chronic steroids and he is immunocompromise Check cultures and start IV vancomycin and Zosyn  Cellulitis, multiple sites with multiple wounds infected on his lower extremities and back Probably the cause of sepsis Continue with IV antibiotics Wound care  Insulin-dependent diabetes mellitus Continue with Lantus/insulin sliding scale  History of Addison's disease Switch steroids to stress dose at 100 mg IV every 8 of Solu-Cortef  History of gastric ulcer and bleeding on Protonix status post EGD in 2016  Chronic kidney disease stage III, actually creatinine is better  History of hypothyroidism on Armour Thyroid to be continued  History of coronary artery disease status post stent placements in the past  Diastolic congestive heart failure last  echocardiogram done on 03/2017 showing ejection fraction 50 to 55%, continue with Coreg.  History of hepatitis, known type  History of PE in  2016 on chronic Coumadin therapy  DVT Prophylaxis Coumadin  AM Labs Ordered, also please review Full Orders  Family Communication: Admission, patients condition and plan of care including tests being ordered have been discussed with the patient and wife who indicate understanding and agree with the plan and Code Status.  Code Status full  Disposition Plan: To be determined  Time spent in minutes : 44 minutes  Condition GUARDED   @SIGNATURE @

## 2017-12-31 NOTE — ED Notes (Signed)
I Stat [place] Acid result of 2.33 reported to Dr. Vanita Panda
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
## 2017-12-31 NOTE — Progress Notes (Signed)
ANTICOAGULATION CONSULT NOTE - Initial Consult  Pharmacy Consult for Coumadin Indication: Hx of  DVT  No Known Allergies  Patient Measurements: Height: 5\' 10"  (177.8 cm) Weight: 210 lb (95.3 kg) IBW/kg (Calculated) : 73  Vital Signs: Temp: 98.4 F (36.9 C) (12/07 1215) Temp Source: Oral (12/07 1215) BP: 131/78 (12/07 1500) Pulse Rate: 26 (12/07 1500)  Labs: Recent Labs    12/31/17 1253  HGB 14.6  HCT 46.8  PLT 344  LABPROT 40.9*  INR 4.34*  CREATININE 1.38*  CKTOTAL 751*    Estimated Creatinine Clearance: 52.8 mL/min (A) (by C-G formula based on SCr of 1.38 mg/dL (H)).   Medical History: Past Medical History:  Diagnosis Date  . Acute lower GI bleeding    a. admitted to [facility] 04/11/2013;  b. 04/2014 EGD: 1.  gastric erosions, mild prox gastritis and bulbar duodenitis->Protonix.  . Addison's disease ([place])   . Anemia   . Anemia in CKD (chronic kidney disease) 06/25/2015  . Chronic back pain   . Chronic diastolic CHF (congestive heart failure) ([place])    a. His initial ejection fraction was between 30 and 35%;  b. 04/2013 Echo: EF 50-55% Gr1 DD, mild-mod MR;  c. 04/2014 Echo: EF 60-65%, Gr 1 DD, triv TR.  . CKD (chronic kidney disease), stage III ([place])   . Coronary artery disease    a. 07/2014 - s/p difficult PCI - had pressure wire analysis of LAD s/p DES in mid segment c/b wire-induced dissection in the distal segment of the LAD which was treated with repeated balloon inflations. Also had DES to rPDA - again had either spasm distal to the stent placement or an edge dissection but the vessel was small in that area; procedure aborted.  . Daily headache   . GERD (gastroesophageal reflux disease)   . H/O hiatal hernia   . Hepatitis    a. 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  . History of aortic insufficiency   . Hx of cardiovascular stress test    a. ETT-Myoview 7/14:  Low risk, inferior defect consistent with thinning, no ischemia, normal wall motion, EF 54%  . Hypertension   . Hypogonadism male    . Obesity   . Peptic ulcer disease   . Pulmonary embolism ([place])    a. 04/2014 CTA: Bilat submassive PE ->coumadin.  . Pulmonary HTN ([place])   . Thyroid disease    Assessment: 77 yo M on Coumadin PTA for VTE treatment. INR on admit elevated at 4.34. Hgb and plts ok.  Goal of Therapy:  INR 2-3 Monitor platelets by anticoagulation protocol: Yes   Plan:  Hold Coumadin tonight Monitor daily INR, CBC, s/s of bleed  Letia Guidry J 12/31/2017,3:13 PM

## 2017-12-31 NOTE — Progress Notes (Signed)
Air mattress ordered for patient, given presentation to ED with multiple stage 2-3 wounds.

## 2018-01-01 ENCOUNTER — Inpatient Hospital Stay (HOSPITAL_COMMUNITY): Payer: Medicare HMO

## 2018-01-01 DIAGNOSIS — L8990 Pressure ulcer of unspecified site, unspecified stage: Secondary | ICD-10-CM

## 2018-01-01 DIAGNOSIS — L97529 Non-pressure chronic ulcer of other part of left foot with unspecified severity: Secondary | ICD-10-CM

## 2018-01-01 LAB — GLUCOSE, CAPILLARY
Glucose-Capillary: 141 mg/dL — ABNORMAL HIGH (ref 70–99)
Glucose-Capillary: 122 mg/dL — ABNORMAL HIGH (ref 70–99)
Glucose-Capillary: 205 mg/dL — ABNORMAL HIGH (ref 70–99)
Glucose-Capillary: 113 mg/dL — ABNORMAL HIGH (ref 70–99)

## 2018-01-01 LAB — PROTIME-INR
INR: 4.96
Prothrombin Time: 45.3 seconds — ABNORMAL HIGH (ref 11.4–15.2)

## 2018-01-01 LAB — CBC
HCT: 39.9 % (ref 39.0–52.0)
Hemoglobin: 12.5 g/dL — ABNORMAL LOW (ref 13.0–17.0)
MCH: 24.8 pg — ABNORMAL LOW (ref 26.0–34.0)
MCHC: 31.3 g/dL (ref 30.0–36.0)
MCV: 79 fL — ABNORMAL LOW (ref 80.0–100.0)
Platelets: 285 10*3/uL (ref 150–400)
RBC: 5.05 MIL/uL (ref 4.22–5.81)
RDW: 18.9 % — ABNORMAL HIGH (ref 11.5–15.5)
WBC: 15.4 10*3/uL — ABNORMAL HIGH (ref 4.0–10.5)
nRBC: 0 % (ref 0.0–0.2)

## 2018-01-01 LAB — URINE CULTURE
Culture: NO GROWTH
Special Requests: NORMAL

## 2018-01-01 LAB — BASIC METABOLIC PANEL
Anion gap: 13 (ref 5–15)
BUN: 19 mg/dL (ref 8–23)
CHLORIDE: 108 mmol/L (ref 98–111)
CO2: 18 mmol/L — ABNORMAL LOW (ref 22–32)
Calcium: 7.7 mg/dL — ABNORMAL LOW (ref 8.9–10.3)
Creatinine, Ser: 1.58 mg/dL — ABNORMAL HIGH (ref 0.61–1.24)
GFR calc Af Amer: 49 mL/min — ABNORMAL LOW (ref >60)
GFR calc non Af Amer: 42 mL/min — ABNORMAL LOW (ref >60)
Glucose, Bld: 135 mg/dL — ABNORMAL HIGH (ref 70–99)
Potassium: 2.8 mmol/L — ABNORMAL LOW (ref 3.5–5.1)
Sodium: 139 mmol/L (ref 135–145)

## 2018-01-01 LAB — TSH: TSH: 1.435 u[IU]/mL (ref 0.350–4.500)

## 2018-01-01 LAB — LACTIC ACID, PLASMA: Lactic Acid, Venous: 1.6 mmol/L (ref 0.5–1.9)

## 2018-01-01 LAB — MAGNESIUM: Magnesium: 1.9 mg/dL (ref 1.7–2.4)

## 2018-01-01 MED ORDER — METOPROLOL TARTRATE 5 MG/5ML IV SOLN
5.0000 mg | Freq: Once | INTRAVENOUS | Status: AC
  Administered 2018-01-01: 5 mg via INTRAVENOUS
  Filled 2018-01-01: qty 5

## 2018-01-01 MED ORDER — POTASSIUM CHLORIDE 10 MEQ/100ML IV SOLN
INTRAVENOUS | Status: AC
  Administered 2018-01-01: 10 meq via INTRAVENOUS
  Filled 2018-01-01: qty 100

## 2018-01-01 MED ORDER — MEGESTROL ACETATE 20 MG PO TABS
20.0000 mg | ORAL_TABLET | Freq: Every day | ORAL | Status: DC
  Administered 2018-01-01 – 2018-01-10 (×10): 20 mg via ORAL
  Filled 2018-01-01 (×10): qty 1

## 2018-01-01 MED ORDER — POTASSIUM CHLORIDE 10 MEQ/100ML IV SOLN
10.0000 meq | INTRAVENOUS | Status: AC
  Administered 2018-01-01 (×2): 10 meq via INTRAVENOUS
  Filled 2018-01-01: qty 100

## 2018-01-01 MED ORDER — POTASSIUM CHLORIDE CRYS ER 20 MEQ PO TBCR
40.0000 meq | EXTENDED_RELEASE_TABLET | Freq: Two times a day (BID) | ORAL | Status: AC
  Administered 2018-01-01 (×2): 40 meq via ORAL
  Filled 2018-01-01 (×2): qty 2

## 2018-01-01 NOTE — Clinical Social Work Note (Signed)
Clinical Social Work Assessment  Patient Details  Name: [name] MRN: [id number] Date of Birth: 1940/07/31  Date of referral:  01/01/18               Reason for consult:  Discharge Planning, Facility Placement                Permission sought to share information with:  Family Supports Permission granted to share information::  Yes, Verbal Permission Granted  Name::     Phyllis Whitefield  Agency::     Relationship::  souse  Contact Information:  [phone]  Housing/Transportation Living arrangements for the past 2 months:  [place] of Information:  Patient Patient Interpreter Needed:  None Criminal Activity/Legal Involvement Pertinent to Current Situation/Hospitalization:  No - Comment as needed Significant Relationships:  Adult Children, Spouse Lives with:  Spouse Do you feel safe going back to the place where you live?  Yes Need for family participation in patient care:  Yes (Comment)  Care giving concerns:  No family at bedside. Patient stated he lives at home with spouse and has support from 4 adult son's that live in the area   Social Worker assessment / plan:  CSW met patient at bedside to offer support and discuss discharge needs. Patient stated he is not interested in going to rehab. Patient stated why go to rehab when he can go home and get home health. Patient stated his wife will be able to take care of him with the support of his sons. Patient stated he is open to go home with home health following. CSW will let RNCM know that patient wants to go home   Employment status:  Retired Nurse, adult PT Recommendations:  [place] / Referral to community resources:  [place]  Patient/Family's Response to care: Patient super pleasant during assessment stating that he appreciates the care he has received   Patient/Family's Understanding of and Emotional Response to Diagnosis,  Current Treatment, and Prognosis:  Patient wants to go home with home health   Emotional Assessment Appearance:  Appears stated age Attitude/Demeanor/Rapport:  Engaged Affect (typically observed):  Accepting, Pleasant, Happy Orientation:  Oriented to Situation, Oriented to  Time, Oriented to Place, Oriented to Self Alcohol / Substance use:  Not Applicable Psych involvement (Current and /or in the community):  No (Comment)  Discharge Needs  Concerns to be addressed:  Care Coordination Readmission within the last 30 days:  No Current discharge risk:  Dependent with Mobility Barriers to Discharge:  Continued Medical Work up   [employer], LCSW 01/01/2018, 2:45 PM

## 2018-01-01 NOTE — Progress Notes (Signed)
PROGRESS NOTE    [name]  ZLD:[id number] DOB: [birth year] DOA: 12/31/2017 PCP: Lawerance Cruel, MD   Brief Narrative:  [name]  is a 77 y.o. 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  ED he was found to be hypotensive, tachycardic and with elevated lactic acid and WBC count.    Assessment & Plan:   Active Problems:   Cellulitis   Pressure injury of skin   Sepsis secondary to cellulitis with multiple wounds on the lower extremities and back Improving.  Patient was started on broad-spectrum IV antibiotics with IV vancomycin and Zosyn since admission.  He was also started on stress dose steroids at 100 mg every 8 hours of Solu Cortef.  Follow-up blood cultures.  Get wound care consult for the back wound and left lower extremity toe ulcer.  We will get x-rays of the left foot. Pain control and physical therapy to start tomorrow.    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 He was started on stress dose steroids since admission.    Insulin-dependent diabetes mellitus CBG (last 3)  Recent Labs    12/31/17 1456 12/31/17 2048 01/01/18 0834  GLUCAP 110* 138* 141*   Last hemoglobin A1c 7.4.  CBGs are better controlled this morning.  Resume Lantus and sliding scale insulin.    Stage III CKD Creatinine at baseline.   History of hypothyroidism TSH within normal limits Resume with thyroid supplementation.    History of coronary artery disease Patient denies any chest pain or shortness of breath at this time.    History of pulmonary embolism Resume Coumadin as per pharmacy today INR is supratherapeutic so we will hold the Coumadin for tonight.    History of chronic diastolic heart failure Last  echo showed preserved left ventricular ejection fraction.  Continue with Coreg at this time.  He appears to be compensated.   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  Neurosurgery consulted recommended rehabilitation versus decompression if pain is not well controlled. Physical therapy evaluation and if his back pain is not well controlled plan to consult neurosurgery.   History of mild dementia Will need outpatient follow-up with neurology.  DVT prophylaxis: Coumadin Code Status: (Full code Family Communication: None at bedside Disposition Plan: Pending clinical improvement  Consultants:   None   Procedures: None  Antimicrobials: IV vancomycin and Zosyn since admission  Subjective: Patient denies any complaints at this time  Objective: Vitals:   01/01/18 0439 01/01/18 0600 01/01/18 0838 01/01/18 1051  BP: 106/88  96/74 120/80  Pulse: (!) 128 83 (!) 135 (!) 130  Resp: 18  19   Temp: 98.8 F (37.1 C)  98.4 F (36.9 C)   TempSrc: Oral  Oral   SpO2: 94%  96%   Weight: 94.4 kg     Height:        Intake/Output Summary (Last 24 hours) at 01/01/2018 1121 Last data filed at 01/01/2018 0554 Gross per 24 hour  Intake 2710.19 ml  Output 201 ml  Net 2509.19 ml   Filed Weights   12/31/17 1216 01/01/18 0439  Weight: 95.3 kg 94.4 kg    Examination:  General exam: Appears disheveled, but in acute distress  Respiratory system: Clear to auscultation. Respiratory  effort normal.   Cardiovascular system: S1 & S2 heard, RRR. No JVD, murmurs,  Gastrointestinal system: Abdomen is nondistended, soft and nontender. No organomegaly or masses felt. Normal bowel sounds heard. Central nervous system: Alert and and oriented to place and person only Extremities: Left toe ulcer on the plantar aspect Skin: See above Psychiatry:Mood & affect appropriate.     Data Reviewed: I have personally reviewed following labs and  imaging studies  CBC: Recent Labs  Lab 12/31/17 1253 01/01/18 0457  WBC 17.4* 15.4*  NEUTROABS 12.9*  --   HGB 14.6 12.5*  HCT 46.8 39.9  MCV 79.5* 79.0*  PLT 344 528   Basic Metabolic Panel: Recent Labs  Lab 12/31/17 1253 01/01/18 0457 01/01/18 1016  NA 137 139  --   K 3.5 2.8*  --   CL 103 108  --   CO2 21* 18*  --   GLUCOSE 113* 135*  --   BUN 21 19  --   CREATININE 1.38* 1.58*  --   CALCIUM 8.6* 7.7*  --   MG  --   --  1.9   GFR: Estimated Creatinine Clearance: 45.9 mL/min (A) (by C-G formula based on SCr of 1.58 mg/dL (H)). Liver Function Tests: Recent Labs  Lab 12/31/17 1253  AST 53*  ALT 65*  ALKPHOS 98  BILITOT 1.3*  PROT 6.7  ALBUMIN 2.7*   No results for input(s): LIPASE, AMYLASE in the last 168 hours. No results for input(s): AMMONIA in the last 168 hours. Coagulation Profile: Recent Labs  Lab 12/31/17 1253 01/01/18 0457  INR 4.34* 4.96*   Cardiac Enzymes: Recent Labs  Lab 12/31/17 1253  CKTOTAL 751*   BNP (last 3 results) No results for input(s): PROBNP in the last 8760 hours. HbA1C: No results for input(s): HGBA1C in the last 72 hours. CBG: Recent Labs  Lab 12/31/17 1456 12/31/17 2048 01/01/18 0834  GLUCAP 110* 138* 141*   Lipid Profile: No results for input(s): CHOL, HDL, LDLCALC, TRIG, CHOLHDL, LDLDIRECT in the last 72 hours. Thyroid Function Tests: Recent Labs    01/01/18 0457  TSH 1.435   Anemia Panel: No results for input(s): VITAMINB12, FOLATE, FERRITIN, TIBC, IRON, RETICCTPCT in the last 72 hours. Sepsis Labs: Recent Labs  Lab 12/31/17 1307 01/01/18 1016  LATICACIDVEN 2.33* 1.6    Recent Results (from the past 240 hour(s))  Urine culture     Status: None   Collection Time: 12/31/17 12:53 PM  Result Value Ref Range Status   Specimen Description URINE, RANDOM  Final   Special Requests unknown Normal  Final   Culture   Final    NO GROWTH Performed at [facility], [place] [address], [place],  [place] [address]    Report Status 01/01/2018 FINAL  Final  MRSA PCR Screening     Status: Abnormal   Collection Time: 12/31/17  5:38 PM  Result Value Ref Range Status   MRSA by PCR POSITIVE (A) NEGATIVE Final    Comment:        The GeneXpert MRSA Assay (FDA approved for NASAL specimens only), is one component of a comprehensive MRSA colonization surveillance program. It is not intended to diagnose MRSA infection nor to guide or monitor treatment for MRSA infections. CRITICAL RESULT CALLED TO, READ BACK BY AND VERIFIED WITHMarylen Ponto RN [phone] BY GF Performed at [facility], [place] [address] [place], [place] [address]          Radiology Studies: Dg  Chest Port 1 View  Result Date: 12/31/2017 CLINICAL DATA:  Altered level of consciousness, status fall EXAM: PORTABLE CHEST 1 VIEW COMPARISON:  None. FINDINGS: The heart size and mediastinal contours are within normal limits. Both lungs are clear. The visualized skeletal structures are unremarkable. IMPRESSION: No active disease. Electronically Signed   By: [name]   On: 12/31/2017 13:38        Scheduled Meds: . carvedilol  6.25 mg Oral BID WC  . Chlorhexidine Gluconate Cloth  6 each Topical Q0600  . folic acid  0.5 mg Oral Daily  . hydrocortisone sod succinate (SOLU-CORTEF) inj  100 mg Intravenous Q8H  . insulin aspart  0-5 Units Subcutaneous QHS  . insulin aspart  0-9 Units Subcutaneous TID WC  . insulin glargine  26 Units Subcutaneous QHS  . megestrol  20 mg Oral Daily  . methocarbamol  500 mg Oral TID  . mupirocin ointment  1 application Nasal BID  . potassium chloride  40 mEq Oral BID  . senna-docusate  2 tablet Oral BID  . thyroid  60 mg Oral QAC breakfast  . traZODone  100 mg Oral QHS  . vitamin B-12  1,000 mcg Oral Daily  . Warfarin - Pharmacist Dosing Inpatient   Does not apply q1800   Continuous Infusions: . sodium chloride Stopped (12/31/17 1647)  . sodium chloride 75 mL/hr at 12/31/17 1941  .  piperacillin-tazobactam (ZOSYN)  IV 3.375 g (01/01/18 0547)  . potassium chloride    . vancomycin       LOS: 1 day    Time spent: 38 minutes    Hosie Poisson, MD [facility] Pager [phone]  If 7PM-7AM, please contact night-coverage [email or web address] Password [email or web address] 01/01/2018, 11:21 AM

## 2018-01-01 NOTE — Progress Notes (Signed)
Patients Heart Rate was sustaining in the 130's. Pt was asymptomatic and MD was paged. MD ordered mg of Metoprolol IV.

## 2018-01-01 NOTE — Progress Notes (Signed)
Patient has a critical INR of 4.96. MD has been notified.

## 2018-01-01 NOTE — Progress Notes (Signed)
ANTICOAGULATION CONSULT NOTE - [place] for Coumadin Indication: Hx of PE (Apr 2016)  No Known Allergies  Patient Measurements: Height: 5\' 10"  (177.8 cm) Weight: 208 lb 1.8 oz (94.4 kg) IBW/kg (Calculated) : 73  Vital Signs: Temp: 98.4 F (36.9 C) (12/08 0838) Temp Source: Oral (12/08 0838) BP: 96/74 (12/08 0838) Pulse Rate: 135 (12/08 0838)  Labs: Recent Labs    12/31/17 1253 01/01/18 0457  HGB 14.6 12.5*  HCT 46.8 39.9  PLT 344 285  LABPROT 40.9* 45.3*  INR 4.34* 4.96*  CREATININE 1.38* 1.58*  CKTOTAL 751*  --     Estimated Creatinine Clearance: 45.9 mL/min (A) (by C-G formula based on SCr of 1.58 mg/dL (H)).   Medical History: Past Medical History:  Diagnosis Date  . Acute lower GI bleeding    a. [facility] to [facility]/11/[facility];  b. 04/2014 EGD: 1.  gastric erosions, mild prox gastritis and bulbar duodenitis->Protonix.  . Addison's disease ([place])   . Anemia   . Anemia in CKD (chronic kidney disease) 06/25/2015  . Chronic back pain   . Chronic diastolic CHF (congestive heart failure) ([place])    a. His initial ejection fraction was between 30 and 35%;  b. 04/2013 Echo: EF 50-55% Gr1 DD, mild-mod MR;  c. 04/2014 Echo: EF 60-65%, Gr 1 DD, triv TR.  . CKD (chronic kidney disease), stage III ([place])   . Coronary artery disease    a. 07/2014 - s/p difficult PCI - had pressure wire analysis of LAD s/p DES in mid segment c/b wire-induced dissection in the distal segment of the LAD which was treated with repeated balloon inflations. Also had DES to rPDA - again had either spasm distal to the stent placement or an edge dissection but the vessel was small in that area; procedure aborted.  . Daily headache   . GERD (gastroesophageal reflux disease)   . H/O hiatal hernia   . Hepatitis    a. 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  . History of aortic insufficiency   . Hx of cardiovascular stress test    a. ETT-Myoview 7/14:  Low risk, inferior defect consistent with thinning, no  ischemia, normal wall motion, EF 54%  . Hypertension   . Hypogonadism male   . Obesity   . Peptic ulcer disease   . Pulmonary embolism ([place])    a. 04/2014 CTA: Bilat submassive PE ->coumadin.  . Pulmonary HTN ([place])   . Thyroid disease    Assessment: 77 yo M on Coumadin PTA for VTE treatment. INR on admit elevated at 4.34, rising to 4.96 in setting of lethergy / decreased PO intake.  No bleeding noted.  Hgb and plts ok.  Goal of Therapy:  INR 2-3 Monitor platelets by anticoagulation protocol: Yes   Plan:  Hold Coumadin tonight Monitor daily INR, CBC, s/s of bleed  [employer], Pharm.D., BCPS Clinical Pharmacist  **Pharmacist phone directory can now be found on [email or web address] (PW [email or web address]).  Listed under [place].  01/01/2018 9:25 AM

## 2018-01-01 NOTE — Evaluation (Signed)
Physical Therapy Evaluation Patient Details Name: [name] MRN: [id number] DOB: 08-Jun-1940 Today's Date: 01/01/2018   History of Present Illness  [name]  is a 77 y.o. 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   Clinical Impression  Pt admitted with above diagnosis. Pt currently with functional limitations due to the deficits listed below (see PT Problem List). Pt was only able to sit EOB 3 min with mod to max assist.  Pt very deconditioned and has tone in LEs, poor coordination and weakness that affects his mobility.  Will follow acutley and progress as able.   Pt will benefit from skilled PT to increase their independence and safety with mobility to allow discharge to the venue listed below.      Follow Up Recommendations SNF;Supervision/Assistance - 24 hour    Equipment Recommendations  None recommended by PT    Recommendations for Other Services       Precautions / Restrictions Precautions Precautions: Fall Restrictions Weight Bearing Restrictions: No      Mobility  Bed Mobility Overal bed mobility: Needs Assistance Bed Mobility: Supine to Sit     Supine to sit: Max assist;Total assist;+2 for physical assistance     General bed mobility comments: Pt needed max assist to move LEs off bed and total assist for elevation of trunk.   Transfers                 General transfer comment: unable  Ambulation/Gait                Stairs            Wheelchair Mobility    Modified Rankin (Stroke Patients Only)       Balance Overall balance assessment: Needs assistance Sitting-balance support: No upper extremity supported;Feet supported Sitting balance-Leahy Scale: Poor Sitting balance - Comments:  Pt needed mod to max assist to sit EOB and only able to sit for 3 min as pt was dizzy.  Took BP but it was 130/70.  Unsure whypt was so dizzy sitting.   Postural control: Posterior lean                                   Pertinent Vitals/Pain Pain Assessment: 0-10 Pain Score: 6  Pain Location: back Pain Descriptors / Indicators: Aching Pain Intervention(s): Limited activity within patient's tolerance;Monitored during session;Repositioned    Home Living Family/patient expects to be discharged to:: Private residence Living Arrangements: Spouse/significant other Available Help at Discharge: Family;Available 24 hours/day Type of Home: House Home Access: Stairs to enter Entrance Stairs-Rails: Left Entrance Stairs-Number of Steps: 2 Home Layout: One level Home Equipment: Walker - 2 wheels;Shower seat      Prior Function Level of Independence: Needs assistance   Gait / Transfers Assistance Needed: Walking with RW needing mod assist per pt  ADL's / Homemaking Assistance Needed: B/D pt states he did on his own  Comments: Unsure if pt a good historian      Hand Dominance   Dominant Hand: Right    Extremity/Trunk Assessment   Upper Extremity Assessment Upper Extremity Assessment: Defer to OT evaluation    Lower Extremity Assessment Lower Extremity Assessment: RLE deficits/detail;LLE deficits/detail RLE Deficits / Details: multiple wounds  on LEs, grossly 3/5 with tone noted.  RLE Coordination: decreased gross motor;decreased fine motor LLE Deficits / Details: multiple wounds on LEs, grossly 3/5 with tone noted. LLE Coordination: decreased gross motor;decreased fine motor    Cervical / Trunk Assessment Cervical / Trunk Assessment: Kyphotic  Communication   Communication: No difficulties  Cognition Arousal/Alertness: Awake/alert Behavior During Therapy: WFL for tasks assessed/performed Overall Cognitive Status: No family/caregiver present to determine baseline  cognitive functioning                                        General Comments      Exercises     Assessment/Plan    PT Assessment Patient needs continued PT services  PT Problem List Decreased activity tolerance;Decreased balance;Decreased mobility;Decreased knowledge of use of DME;Decreased safety awareness;Decreased cognition;Decreased coordination;Decreased strength;Decreased range of motion;Pain       PT Treatment Interventions DME instruction;Functional mobility training;Therapeutic activities;Therapeutic exercise;Balance training;Neuromuscular re-education;Cognitive remediation;Patient/family education;Wheelchair mobility training    PT Goals (Current goals can be found in the Care Plan section)  Acute Rehab PT Goals Patient Stated Goal: to go home PT Goal Formulation: With patient Time For Goal Achievement: 01/15/18 Potential to Achieve Goals: Good    Frequency Min 2X/week   Barriers to discharge Decreased caregiver support(wife states she cannot care for pt)      Co-evaluation               AM-PAC PT "6 Clicks" Mobility  Outcome Measure Help needed turning from your back to your side while in a flat bed without using bedrails?: Total Help needed moving from lying on your back to sitting on the side of a flat bed without using bedrails?: Total Help needed moving to and from a bed to a chair (including a wheelchair)?: Total Help needed standing up from a chair using your arms (e.g., wheelchair or bedside chair)?: Total Help needed to walk in hospital room?: Total Help needed climbing 3-5 steps with a railing? : Total 6 Click Score: 6    End of Session Equipment Utilized During Treatment: Gait belt;Oxygen Activity Tolerance: Patient limited by fatigue;Patient limited by pain Patient left: in bed;with call bell/phone within reach;with bed alarm set Nurse Communication: Need for lift equipment;Mobility status PT Visit Diagnosis: Muscle  weakness (generalized) (M62.81);History of falling (Z91.81)    Time: 1700-1749 PT Time Calculation (min) (ACUTE ONLY): 25 min   Charges:   PT Evaluation $PT Eval Moderate Complexity: 1 Mod PT Treatments $Therapeutic Activity: 8-22 mins        [name],PT Acute Rehabilitation Services Pager:  [phone]  Office:  [phone]    [name] 01/01/2018, 12:16 PM

## 2018-01-02 LAB — CBC
HCT: 36.4 % — ABNORMAL LOW (ref 39.0–52.0)
Hemoglobin: 11.1 g/dL — ABNORMAL LOW (ref 13.0–17.0)
MCH: 24.2 pg — ABNORMAL LOW (ref 26.0–34.0)
MCHC: 30.5 g/dL (ref 30.0–36.0)
MCV: 79.3 fL — ABNORMAL LOW (ref 80.0–100.0)
PLATELETS: 287 10*3/uL (ref 150–400)
RBC: 4.59 MIL/uL (ref 4.22–5.81)
RDW: 18.9 % — ABNORMAL HIGH (ref 11.5–15.5)
WBC: 14.3 10*3/uL — ABNORMAL HIGH (ref 4.0–10.5)
nRBC: 0 % (ref 0.0–0.2)

## 2018-01-02 LAB — BASIC METABOLIC PANEL
Anion gap: 7 (ref 5–15)
BUN: 20 mg/dL (ref 8–23)
CO2: 19 mmol/L — ABNORMAL LOW (ref 22–32)
Calcium: 7.8 mg/dL — ABNORMAL LOW (ref 8.9–10.3)
Chloride: 114 mmol/L — ABNORMAL HIGH (ref 98–111)
Creatinine, Ser: 1.57 mg/dL — ABNORMAL HIGH (ref 0.61–1.24)
GFR calc Af Amer: 49 mL/min — ABNORMAL LOW (ref >60)
GFR calc non Af Amer: 42 mL/min — ABNORMAL LOW (ref >60)
Glucose, Bld: 142 mg/dL — ABNORMAL HIGH (ref 70–99)
Potassium: 3.4 mmol/L — ABNORMAL LOW (ref 3.5–5.1)
SODIUM: 140 mmol/L (ref 135–145)

## 2018-01-02 LAB — PROTIME-INR
INR: 2.77
PROTHROMBIN TIME: 28.9 s — AB (ref 11.4–15.2)

## 2018-01-02 LAB — RETICULOCYTES
Immature Retic Fract: 9.1 % (ref 2.3–15.9)
RBC.: 4.55 MIL/uL (ref 4.22–5.81)
Retic Count, Absolute: 61.9 10*3/uL (ref 19.0–186.0)
Retic Ct Pct: 1.4 % (ref 0.4–3.1)

## 2018-01-02 LAB — GLUCOSE, CAPILLARY
Glucose-Capillary: 116 mg/dL — ABNORMAL HIGH (ref 70–99)
Glucose-Capillary: 119 mg/dL — ABNORMAL HIGH (ref 70–99)
Glucose-Capillary: 116 mg/dL — ABNORMAL HIGH (ref 70–99)
Glucose-Capillary: 123 mg/dL — ABNORMAL HIGH (ref 70–99)

## 2018-01-02 LAB — FERRITIN: Ferritin: 88 ng/mL (ref 24–336)

## 2018-01-02 LAB — IRON AND TIBC
Iron: 13 ug/dL — ABNORMAL LOW (ref 45–182)
Saturation Ratios: 7 % — ABNORMAL LOW (ref 17.9–39.5)
TIBC: 175 ug/dL — ABNORMAL LOW (ref 250–450)
UIBC: 162 ug/dL

## 2018-01-02 LAB — FOLATE: Folate: 22.8 ng/mL (ref >5.9)

## 2018-01-02 LAB — VITAMIN B12: VITAMIN B 12: 1446 pg/mL — AB (ref 180–914)

## 2018-01-02 MED ORDER — POTASSIUM CHLORIDE CRYS ER 20 MEQ PO TBCR
40.0000 meq | EXTENDED_RELEASE_TABLET | Freq: Once | ORAL | Status: AC
  Administered 2018-01-02: 40 meq via ORAL
  Filled 2018-01-02: qty 2

## 2018-01-02 MED ORDER — COLLAGENASE 250 UNIT/GM EX OINT
TOPICAL_OINTMENT | Freq: Every day | CUTANEOUS | Status: DC
  Administered 2018-01-02 – 2018-01-08 (×7): via TOPICAL
  Administered 2018-01-09: 1 via TOPICAL
  Administered 2018-01-10: 11:00:00 via TOPICAL
  Filled 2018-01-02 (×2): qty 30

## 2018-01-02 MED ORDER — VANCOMYCIN HCL 10 G IV SOLR
1250.0000 mg | INTRAVENOUS | Status: DC
  Administered 2018-01-02 – 2018-01-06 (×5): 1250 mg via INTRAVENOUS
  Filled 2018-01-02 (×5): qty 1250

## 2018-01-02 MED ORDER — HYDROCORTISONE NA SUCCINATE PF 100 MG IJ SOLR
50.0000 mg | Freq: Three times a day (TID) | INTRAMUSCULAR | Status: DC
  Administered 2018-01-02 – 2018-01-03 (×3): 50 mg via INTRAVENOUS
  Filled 2018-01-02 (×3): qty 2

## 2018-01-02 MED ORDER — WARFARIN SODIUM 1 MG PO TABS
1.5000 mg | ORAL_TABLET | Freq: Once | ORAL | Status: AC
  Administered 2018-01-02: 1.5 mg via ORAL
  Filled 2018-01-02: qty 1
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
## 2018-01-02 NOTE — Consult Note (Signed)
[place] Nurse wound consult note Reason for Consult: Consult requested for several locations.  Wound type: Left inner foot with full thickness wound; 1.5X1X.1cm, red and moist, small amt bloody drainage. Right outer ankle with full thickness dry scab; .5X.5cm, no odor, drainage, or fluctuance Right outer hip with 2 unstageable pressure injuries; 3X3cm yellow slough with small amt yellow drainage, no odor and 5X3 cm, tightly adhered brown eschar, small amt tan drainage and some odor.  Pt has patchy areas of red moist partial thickness wounds on his back, appear to be from a previous rash which occurred earlier, according to the EMR notes. They are beginning to dry and heal; no topical treatment needed at this time. Pressure Injury POA: Yes Dressing procedure/placement/frequency: Foam dressing to left foot to protect and promote healing.  Santyl ointment to provide enzymatic debridement of nonviable tissue to right hip wounds and foam dressing to attempt to protect from soiling.  Discussed plan of care with patient; no family present at the bedside. Please re-consult if further assistance is needed.  Thank-you,  Julien Girt MSN, [place], [place], [place], [place]

## 2018-01-02 NOTE — Progress Notes (Signed)
ANTICOAGULATION/ANTIBIOTIC CONSULT NOTE - Follow-Up Consult  Pharmacy Consult for Coumadin, Vancomycin and Zosyn Indication: Hx of PE (Apr 2016), cellulitis/LE ulcers  No Known Allergies  Patient Measurements: Height: 5\' 10"  (177.8 cm) Weight: 208 lb 1.8 oz (94.4 kg) IBW/kg (Calculated) : 73  Vital Signs: Temp: 98.2 F (36.8 C) (12/09 1212) Temp Source: Oral (12/09 1212) BP: 146/82 (12/09 1212) Pulse Rate: 75 (12/09 1212)  Labs: Recent Labs    12/31/17 1253 01/01/18 0457 01/02/18 0451  HGB 14.6 12.5* 11.1*  HCT 46.8 39.9 36.4*  PLT 344 285 287  LABPROT 40.9* 45.3* 28.9*  INR 4.34* 4.96* 2.77  CREATININE 1.38* 1.58* 1.57*  CKTOTAL 751*  --   --     Estimated Creatinine Clearance: 46.2 mL/min (A) (by C-G formula based on SCr of 1.57 mg/dL (H)).  Assessment: 77 yo M admitted with sepsis.  Anticoagulation: On Coumadin PTA for VTE treatment. INR on admit elevated at 4.34. INR now down to 2.77 after dose held x 2 days. Hgb down to 11.1, plt wnl. PTA dose 3mg  daily except 1.5mg  on Sundays  ID: abx for cellulitis/LE ulcers, WBC trending down to 14.3 (on chronic steroids), LA 2.33->1.6  Zosyn 12/7 >> Vanc 12/7 >>  Dose adjustments: 12/9 Vanc to 1250mg  IV q24h (think original calc was incorrect as SCr up but calculator gives me higher dose)  12/7 MRSA PCR: positive 12/7 UCx: negative  Goal of Therapy:  INR 2-3 Monitor platelets by anticoagulation protocol: Yes  Vancomycin goal AUC 400-550   Plan:  Change vancomycin to 1250mg  IV q24h. Goal AUC 400-550. Expected AUC: 480 with SCr 1.57 Zosyn 3.375 gm IV q8h (4 hour infusion) Monitor clinical picture, renal function, vanc levels prn F/U abx de-escalation, and LOT Coumadin 1.5mg  po tonight Monitor daily INR, CBC, s/s of bleed  Sherlon Handing, PharmD, BCPS Clinical pharmacist  **Pharmacist phone directory can now be found on [email or web address] ([email or web address]).  Listed under [place]. 01/02/2018 1:25 PM

## 2018-01-02 NOTE — Plan of Care (Signed)
  Problem: Pain Managment: Goal: General experience of comfort will improve Outcome: Progressing   Problem: Skin Integrity: Goal: Risk for impaired skin integrity will decrease Outcome: Not Progressing Treatment plan implemented

## 2018-01-02 NOTE — Progress Notes (Signed)
CM talked to pt with spouse at the bedside. Wife stated that he was at [facility] and was discharged home. She has been working with Levada Dy at [facility] to get him back to a SNF; Judson Roch SW updated. Mindi Slicker RN,MHA,BSn [phone]

## 2018-01-02 NOTE — Clinical Social Work Note (Addendum)
Patient now agreeable to SNF. Wife at bedside and confirmed preference for [facility]. She has already been talking with their admissions coordinator. [facility] has been started by facility. Patient was at [facility] from 11/7-11/18 (11 days). Patient and wife aware that copays of around $160 per day will start on day 21. Sent message to MD requesting OT order.  Dayton Scrape, [place]

## 2018-01-02 NOTE — Progress Notes (Signed)
PROGRESS NOTE    MACGUIRE HOLSINGER  WUJ:[id number] DOB: 07-30-1940 DOA: 12/31/2017 PCP: Lawerance Cruel, MD   Brief Narrative:  [name]  is a 77 y.o. 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  ED he was found to be hypotensive, tachycardic and with elevated lactic acid and WBC count.    Assessment & Plan:   Active Problems:   Cellulitis   Pressure injury of skin   Sepsis secondary to cellulitis with multiple wounds on the lower extremities and back Improving.  Patient was started on broad-spectrum IV antibiotics with IV vancomycin and Zosyn since admission.  He was also started on stress dose steroids at 100 mg every 8 hours of Solu Cortef.  Sepsis improving and blood pressure parameters better we will start tapering down the Solu-Cortef.  Follow-up blood cultures.  Wound care consulted and recommendations given.  X-rays of the left foot does not show any osteomyelitis.  Recommend to continue IV antibiotics for another 24 to 48 hours. Pain control and physical therapy to start tomorrow.    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 He was started on stress dose steroids since admission.    Insulin-dependent diabetes mellitus CBG (last 3)  Recent Labs    01/01/18 1707 01/01/18 2055 01/02/18 0745  GLUCAP 205* 113* 116*   Last hemoglobin A1c 7.4.  CBGs are better controlled this morning.  Resume Lantus and sliding scale insulin.  No change in medications    Stage III CKD Creatinine at baseline.   History of hypothyroidism TSH within normal limits Resume with thyroid supplementation.    History of coronary artery disease Patient denies any chest pain or shortness of breath at this time.    History  of pulmonary embolism Resume Coumadin as per pharmacy, INR is therapeutic   History of chronic diastolic heart failure Last echo showed preserved left ventricular ejection fraction.  Continue with Coreg at this time.  He appears to be compensated.   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  Neurosurgery consulted recommended rehabilitation versus decompression if pain is not well controlled. Physical therapy evaluation and if his back pain is not well controlled plan to consult neurosurgery.   History of mild dementia Will need outpatient follow-up with neurology.  DVT prophylaxis: Coumadin Code Status: (Full code Family Communication: None at bedside Disposition Plan: Pending clinical improvement  Consultants:   None   Procedures: None  Antimicrobials: IV vancomycin and Zosyn since admission  Subjective: Patient denies any complaints at this time patient reports his pain is well controlled.  Objective: Vitals:   01/01/18 2328 01/02/18 0116 01/02/18 0419 01/02/18 0445  BP: 125/64 129/66 134/89   Pulse: 83 79 (!) 129 89  Resp:  18 18   Temp: 98.1 F (36.7 C) 98.1 F (36.7 C) 98.6 F (37 C)   TempSrc: Oral Oral Oral   SpO2: 94% 95% 94% 94%  Weight:      Height:        Intake/Output Summary (Last 24 hours) at 01/02/2018 0947 Last data filed at 01/02/2018 0937 Gross per 24 hour  Intake 1954.44 ml  Output 650 ml  Net 1304.44 ml   Filed Weights   12/31/17 1216 01/01/18 0439  Weight: 95.3  kg 94.4 kg    Examination:  General exam: Appears disheveled, not in any distress at this time  Respiratory system: Clear to auscultation. Respiratory effort normal.  No wheezing or rhonchi   Cardiovascular system: S1 & S2 heard, RRR. No JVD, murmurs,  Gastrointestinal system: Abdomen is soft, nontender, nondistended with good bowel sounds Central nervous system: Alert and and oriented to place and person  only Extremities: Left toe ulcer on the plantar aspect 2 unstageable pressure injuries on the right outer hip Skin: See above Psychiatry:Mood & affect appropriate.     Data Reviewed: I have personally reviewed following labs and imaging studies  CBC: Recent Labs  Lab 12/31/17 1253 01/01/18 0457 01/02/18 0451  WBC 17.4* 15.4* 14.3*  NEUTROABS 12.9*  --   --   HGB 14.6 12.5* 11.1*  HCT 46.8 39.9 36.4*  MCV 79.5* 79.0* 79.3*  PLT 344 285 850   Basic Metabolic Panel: Recent Labs  Lab 12/31/17 1253 01/01/18 0457 01/01/18 1016 01/02/18 0451  NA 137 139  --  140  K 3.5 2.8*  --  3.4*  CL 103 108  --  114*  CO2 21* 18*  --  19*  GLUCOSE 113* 135*  --  142*  BUN 21 19  --  20  CREATININE 1.38* 1.58*  --  1.57*  CALCIUM 8.6* 7.7*  --  7.8*  MG  --   --  1.9  --    GFR: Estimated Creatinine Clearance: 46.2 mL/min (A) (by C-G formula based on SCr of 1.57 mg/dL (H)). Liver Function Tests: Recent Labs  Lab 12/31/17 1253  AST 53*  ALT 65*  ALKPHOS 98  BILITOT 1.3*  PROT 6.7  ALBUMIN 2.7*   No results for input(s): LIPASE, AMYLASE in the last 168 hours. No results for input(s): AMMONIA in the last 168 hours. Coagulation Profile: Recent Labs  Lab 12/31/17 1253 01/01/18 0457 01/02/18 0451  INR 4.34* 4.96* 2.77   Cardiac Enzymes: Recent Labs  Lab 12/31/17 1253  CKTOTAL 751*   BNP (last 3 results) No results for input(s): PROBNP in the last 8760 hours. HbA1C: No results for input(s): HGBA1C in the last 72 hours. CBG: Recent Labs  Lab 01/01/18 0834 01/01/18 1300 01/01/18 1707 01/01/18 2055 01/02/18 0745  GLUCAP 141* 122* 205* 113* 116*   Lipid Profile: No results for input(s): CHOL, HDL, LDLCALC, TRIG, CHOLHDL, LDLDIRECT in the last 72 hours. Thyroid Function Tests: Recent Labs    01/01/18 0457  TSH 1.435   Anemia Panel: No results for input(s): VITAMINB12, FOLATE, FERRITIN, TIBC, IRON, RETICCTPCT in the last 72 hours. Sepsis Labs: Recent Labs   Lab 12/31/17 1307 01/01/18 1016  LATICACIDVEN 2.33* 1.6    Recent Results (from the past 240 hour(s))  Urine culture     Status: None   Collection Time: 12/31/17 12:53 PM  Result Value Ref Range Status   Specimen Description URINE, RANDOM  Final   Special Requests unknown Normal  Final   Culture   Final    NO GROWTH Performed at [facility], [place] [address] [place], [place] [address]    Report Status 01/01/2018 FINAL  Final  MRSA PCR Screening     Status: Abnormal   Collection Time: 12/31/17  5:38 PM  Result Value Ref Range Status   MRSA by PCR POSITIVE (A) NEGATIVE Final    Comment:        The GeneXpert MRSA Assay (FDA approved for NASAL specimens only), is one component  of a comprehensive MRSA colonization surveillance program. It is not intended to diagnose MRSA infection nor to guide or monitor treatment for MRSA infections. CRITICAL RESULT CALLED TO, READ BACK BY AND VERIFIED WITHMarylen Ponto RN [phone] BY GF Performed at [facility], [place] [address] [place], [place] [address]          Radiology Studies: Dg Chest [facility] 1 View  Result Date: 12/31/2017 CLINICAL DATA:  Altered level of consciousness, status fall EXAM: PORTABLE CHEST 1 VIEW COMPARISON:  None. FINDINGS: The heart size and mediastinal contours are within normal limits. Both lungs are clear. The visualized skeletal structures are unremarkable. IMPRESSION: No active disease. Electronically Signed   By: [name]   On: 12/31/2017 13:38   Dg Foot Complete Left  Result Date: 01/01/2018 CLINICAL DATA:  Skin ulcer of left great toe on the medial side of left foot, unspecified ulcer stage. Pt unsure how long he has had ulcer. Pt denies injury to left foot. EXAM: LEFT FOOT - COMPLETE 3+ VIEW COMPARISON:  None. FINDINGS: No fracture or bone lesion. There is no bone resorption to suggest osteomyelitis. Joints are normally aligned. Small plantar and dorsal calcaneal spurs. There is forefoot  soft tissue edema.  No soft tissue air. IMPRESSION: 1. No fracture, bone lesion or evidence of osteomyelitis. Electronically Signed   By: Lajean Manes M.D.   On: 01/01/2018 20:23        Scheduled Meds: . carvedilol  6.25 mg Oral BID WC  . Chlorhexidine Gluconate Cloth  6 each Topical Q0600  . folic acid  0.5 mg Oral Daily  . hydrocortisone sod succinate (SOLU-CORTEF) inj  100 mg Intravenous Q8H  . insulin aspart  0-5 Units Subcutaneous QHS  . insulin aspart  0-9 Units Subcutaneous TID WC  . insulin glargine  26 Units Subcutaneous QHS  . megestrol  20 mg Oral Daily  . methocarbamol  500 mg Oral TID  . mupirocin ointment  1 application Nasal BID  . potassium chloride  40 mEq Oral Once  . senna-docusate  2 tablet Oral BID  . thyroid  60 mg Oral QAC breakfast  . traZODone  100 mg Oral QHS  . vitamin B-12  1,000 mcg Oral Daily  . Warfarin - Pharmacist Dosing Inpatient   Does not apply q1800   Continuous Infusions: . sodium chloride 75 mL/hr at 01/01/18 2139  . piperacillin-tazobactam (ZOSYN)  IV 3.375 g (01/02/18 0500)  . vancomycin 150 mL/hr at 01/01/18 1703     LOS: 2 days    Time spent: 35 minutes    Hosie Poisson, MD [facility] Pager [phone]  If 7PM-7AM, please contact night-coverage [email or web address] Password [facility] 01/02/2018, 9:47 AM

## 2018-01-03 LAB — CBC
HEMATOCRIT: 38.1 % — AB (ref 39.0–52.0)
Hemoglobin: 11.6 g/dL — ABNORMAL LOW (ref 13.0–17.0)
MCH: 24.8 pg — ABNORMAL LOW (ref 26.0–34.0)
MCHC: 30.4 g/dL (ref 30.0–36.0)
MCV: 81.4 fL (ref 80.0–100.0)
Platelets: 299 10*3/uL (ref 150–400)
RBC: 4.68 MIL/uL (ref 4.22–5.81)
RDW: 19.2 % — ABNORMAL HIGH (ref 11.5–15.5)
WBC: 14 10*3/uL — ABNORMAL HIGH (ref 4.0–10.5)
nRBC: 0 % (ref 0.0–0.2)

## 2018-01-03 LAB — BASIC METABOLIC PANEL
Anion gap: 9 (ref 5–15)
BUN: 20 mg/dL (ref 8–23)
CHLORIDE: 116 mmol/L — AB (ref 98–111)
CO2: 17 mmol/L — AB (ref 22–32)
Calcium: 7.9 mg/dL — ABNORMAL LOW (ref 8.9–10.3)
Creatinine, Ser: 1.29 mg/dL — ABNORMAL HIGH (ref 0.61–1.24)
GFR calc Af Amer: >60 mL/min (ref >60)
GFR calc non Af Amer: 53 mL/min — ABNORMAL LOW (ref >60)
Glucose, Bld: 92 mg/dL (ref 70–99)
Potassium: 3.1 mmol/L — ABNORMAL LOW (ref 3.5–5.1)
Sodium: 142 mmol/L (ref 135–145)

## 2018-01-03 LAB — GLUCOSE, CAPILLARY
Glucose-Capillary: 83 mg/dL (ref 70–99)
Glucose-Capillary: 69 mg/dL — ABNORMAL LOW (ref 70–99)
Glucose-Capillary: 84 mg/dL (ref 70–99)
Glucose-Capillary: 102 mg/dL — ABNORMAL HIGH (ref 70–99)

## 2018-01-03 LAB — PROTIME-INR
INR: 2.41
Prothrombin Time: 25.9 seconds — ABNORMAL HIGH (ref 11.4–15.2)

## 2018-01-03 MED ORDER — HYDROCORTISONE NA SUCCINATE PF 100 MG IJ SOLR
50.0000 mg | Freq: Two times a day (BID) | INTRAMUSCULAR | Status: DC
  Administered 2018-01-03 – 2018-01-09 (×12): 50 mg via INTRAVENOUS
  Filled 2018-01-03 (×12): qty 2

## 2018-01-03 MED ORDER — FERROUS SULFATE 325 (65 FE) MG PO TABS
325.0000 mg | ORAL_TABLET | Freq: Two times a day (BID) | ORAL | Status: DC
  Administered 2018-01-03 – 2018-01-10 (×14): 325 mg via ORAL
  Filled 2018-01-03 (×14): qty 1

## 2018-01-03 MED ORDER — SODIUM CHLORIDE 0.9 % IV SOLN
INTRAVENOUS | Status: DC | PRN
  Administered 2018-01-03 – 2018-01-06 (×7): 250 mL via INTRAVENOUS

## 2018-01-03 MED ORDER — POTASSIUM CHLORIDE CRYS ER 20 MEQ PO TBCR
40.0000 meq | EXTENDED_RELEASE_TABLET | Freq: Two times a day (BID) | ORAL | Status: AC
  Administered 2018-01-03 (×2): 40 meq via ORAL
  Filled 2018-01-03 (×2): qty 2

## 2018-01-03 MED ORDER — WARFARIN SODIUM 3 MG PO TABS
3.0000 mg | ORAL_TABLET | Freq: Once | ORAL | Status: AC
  Administered 2018-01-03: 3 mg via ORAL
  Filled 2018-01-03: qty 1

## 2018-01-03 NOTE — Progress Notes (Signed)
Physical Therapy Treatment Patient Details Name: [name] MRN: [id number] DOB: [birth year] Today's Date: 01/03/2018    History of Present Illness [name]  is a 77 y.o. 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     PT Comments    Pt admitted with above diagnosis. Pt currently with functional limitations due to balance and endurance deficits. Pt was able to sit EOB 10 minutes but needed total to max assist due to posterior lean.  Did get pt to come forward with chair in front of pt holding armrests but when pt let go, posterior lean continues and needing total assist again.  Pt does not have good carryover for postural stability with flexor tone in LES and extensor pattern of trunk in sitting.  Poor proprioception of posture.  Will continue to progress as able.  Pt will benefit from skilled PT to increase their independence and safety with mobility to allow discharge to the venue listed below.     Follow Up Recommendations  SNF;Supervision/Assistance - 24 hour     Equipment Recommendations  None recommended by PT    Recommendations for Other Services       Precautions / Restrictions Precautions Precautions: Fall Restrictions Weight Bearing Restrictions: Yes    Mobility  Bed Mobility Overal bed mobility: Needs Assistance Bed Mobility: Supine to Sit;Rolling Rolling: Max assist;+2 for physical assistance   Supine to sit: Max assist;Total assist;+2 for physical assistance     General bed mobility comments: Pt needed max assist to move LEs off bed and total assist for elevation of trunk. Pt pushes against therapy making it difficult to get pt forward.  Used pad to assist with hip movement.   Transfers                  General transfer comment: unable  Ambulation/Gait                 Stairs             Wheelchair Mobility    Modified Rankin (Stroke Patients Only)       Balance Overall balance assessment: Needs assistance Sitting-balance support: No upper extremity supported;Feet supported Sitting balance-Leahy Scale: Poor Sitting balance - Comments: Pt needed max to total  assist to sit EOB for 10 minutes. Pt was dizzy but worked through it and was able to sit a little longer.  Worked on pt leaning forward by placing chair in front of pt and having him hold onto armrests as when he did not have this, he was pushing back against therapist.  Pt sat about 5 min holding armrests of the chair to try and get anterior lean.  Pt stated he needed to use bathroom but before PT and OT could lie him down, he had pooped.  cCleaned pt with total assist.  Pt continued to have BM while cleaning but finally finished.  LArge loose stool.  Nursing made aware but didnt come in to help .  Pt left on his left side to unweight wound on right hip .   Postural control: Posterior lean                                  Cognition Arousal/Alertness: Awake/alert  Behavior During Therapy: WFL for tasks assessed/performed Overall Cognitive Status: No family/caregiver present to determine baseline cognitive functioning                                        Exercises      General Comments        Pertinent Vitals/Pain Pain Assessment: Faces Faces Pain Scale: Hurts whole lot Pain Location: back Pain Descriptors / Indicators: Aching;Grimacing;Guarding Pain Intervention(s): Limited activity within patient's tolerance;Repositioned;Monitored during session    Home Living                      Prior Function            PT Goals (current goals can now be found in the care plan section) Acute Rehab PT Goals Patient Stated Goal: to go home Progress towards PT goals:  Progressing toward goals    Frequency    Min 2X/week      PT Plan Current plan remains appropriate    Co-evaluation PT/OT/SLP Co-Evaluation/Treatment: Yes Reason for Co-Treatment: Complexity of the patient's impairments (multi-system involvement);For patient/therapist safety PT goals addressed during session: Mobility/safety with mobility        AM-PAC PT "6 Clicks" Mobility   Outcome Measure  Help needed turning from your back to your side while in a flat bed without using bedrails?: Total Help needed moving from lying on your back to sitting on the side of a flat bed without using bedrails?: Total Help needed moving to and from a bed to a chair (including a wheelchair)?: Total Help needed standing up from a chair using your arms (e.g., wheelchair or bedside chair)?: Total Help needed to walk in hospital room?: Total Help needed climbing 3-5 steps with a railing? : Total 6 Click Score: 6    End of Session Equipment Utilized During Treatment: Gait belt Activity Tolerance: Patient limited by fatigue;Patient limited by pain Patient left: in bed;with call bell/phone within reach;with bed alarm set Nurse Communication: Need for lift equipment;Mobility status PT Visit Diagnosis: Muscle weakness (generalized) (M62.81);History of falling (Z91.81)     Time: 0930-1006 PT Time Calculation (min) (ACUTE ONLY): 36 min  Charges:  $Therapeutic Activity: 8-22 mins                     [place] Pager:  [phone]  Office:  [place] 01/03/2018, 10:32 AM

## 2018-01-03 NOTE — Evaluation (Signed)
Occupational Therapy Evaluation Patient Details Name: [name] MRN: [id number] DOB: Jan 30, 1940 Today's Date: 01/03/2018    History of Present Illness [name]  is a 77 y.o. 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    Clinical Impression   Pt with decline in function and safety with ADLs and ADL mobility with decreased strength, balance endurance and hx of cognitive impairments (poor awareness of deficits).  Pt was able to sit EOB 10 minutes but needed total to max assist due to posterior lean. Pt requires extensive assist with ADLs and + 2 assist with mobility. Pt would benefit form acute OT services to address impairments to maximize level of function and safety  Follow Up Recommendations  SNF;Supervision/Assistance - 24 hour    Equipment Recommendations  Other (comment)(TBD at next venue of care)    Recommendations for Other Services       Precautions / Restrictions Precautions Precautions: Fall Precaution Comments: R hip and  L foot ulcers Restrictions Weight Bearing Restrictions: Yes      Mobility Bed Mobility Overal bed mobility: Needs Assistance Bed Mobility: Supine to Sit;Rolling Rolling: Max assist;+2 for physical assistance   Supine to sit: Max assist;Total assist;+2 for physical assistance     General bed mobility comments: Pt needed max assist to move LEs off bed and total assist for elevation of trunk. Pt pushes against therapy making it difficult to get pt forward.  Used pad to assist with hip movement.   Transfers                 General transfer comment: unable    Balance Overall balance assessment: Needs assistance Sitting-balance support: No upper extremity supported;Feet supported Sitting  balance-Leahy Scale: Poor Sitting balance - Comments: Pt needed max to total  assist to sit EOB for 10 minutes. Pt was dizzy but worked through it and was able to sit a little longer.  Worked on pt leaning forward by placing chair in front of pt and having him hold onto armrests as when he did not have this, he was pushing back against therapist.  Pt sat about 5 min holding armrests of the chair to try and get anterior lean.  Pt stated he needed to use bathroom but before PT and OT could lie him down, he had pooped.  cCleaned pt with total assist.  Pt continued to have BM while cleaning but finally finished.  LArge loose stool.  Nursing made aware but didnt come in to help .  Pt left on his left side to unweight wound on right hip .   Postural control: Posterior lean                                 ADL either performed or assessed with clinical judgement   ADL Overall ADL's : Needs assistance/impaired     Grooming: Maximal assistance;Bed level   Upper Body Bathing: Total assistance   Lower Body Bathing: Total assistance   Upper Body Dressing : Total assistance   Lower Body Dressing: Total assistance     Toilet Transfer Details (indicate cue type and reason): unable Toileting- Clothing Manipulation and Hygiene: Bed level;Total assistance       Functional mobility during ADLs: Maximal assistance;+2  for physical assistance(for bed mobility, unable to transfer)       Vision Baseline Vision/History: Wears glasses Wears Glasses: Reading only Patient Visual Report: No change from baseline       Perception     Praxis      Pertinent Vitals/Pain Pain Assessment: Faces Faces Pain Scale: Hurts whole lot Pain Location: back Pain Descriptors / Indicators: Aching;Grimacing;Guarding Pain Intervention(s): Limited activity within patient's tolerance;Monitored during session;Repositioned     Hand Dominance Right   Extremity/Trunk Assessment Upper Extremity  Assessment Upper Extremity Assessment: Generalized weakness   Lower Extremity Assessment Lower Extremity Assessment: Defer to PT evaluation   Cervical / Trunk Assessment Cervical / Trunk Assessment: Kyphotic   Communication Communication Communication: No difficulties   Cognition Arousal/Alertness: Awake/alert Behavior During Therapy: WFL for tasks assessed/performed Overall Cognitive Status: No family/caregiver present to determine baseline cognitive functioning                                     General Comments       Exercises     Shoulder Instructions      Home Living Family/patient expects to be discharged to:: Private residence Living Arrangements: Spouse/significant other Available Help at Discharge: Family;Available 24 hours/day Type of Home: House Home Access: Stairs to enter [employer] of Steps: 2 Entrance Stairs-Rails: Left Home Layout: One level     Bathroom Shower/Tub: Occupational psychologist: Standard     Home Equipment: Environmental consultant - 2 wheels;Shower seat   Additional Comments: History obtained from previous admission as pt with dementia and reporting differing things. Per notes, wife unable to physically assist. Wife not present during session.       Prior Functioning/Environment Level of Independence: Needs assistance  Gait / Transfers Assistance Needed: Walking with RW needing mod assist per pt ADL's / Homemaking Assistance Needed: B/D pt states he did on his own   Comments: Unsure if pt a good historian         OT Problem List: Decreased strength;Decreased activity tolerance;Decreased cognition;Decreased knowledge of use of DME or AE;Decreased range of motion;Impaired balance (sitting and/or standing);Decreased coordination;Decreased safety awareness;Pain      OT Treatment/Interventions: Self-care/ADL training;Therapeutic exercise;DME and/or AE instruction;Therapeutic activities;Patient/family  education;Neuromuscular education    OT Goals(Current goals can be found in the care plan section) Acute Rehab OT Goals Patient Stated Goal: to go home OT Goal Formulation: With patient Time For Goal Achievement: 01/17/18 Potential to Achieve Goals: Good ADL Goals Pt Will Perform Grooming: with mod assist;sitting Pt Will Perform Upper Body Bathing: with max assist;with mod assist;sitting Pt Will Perform Upper Body Dressing: with max assist;with mod assist;sitting Additional ADL Goal #1: Pt will complete bed mobility with mod A to sit EOB x 15 minutes for selfcare tasks  OT Frequency: Min 2X/week   Barriers to D/C: Decreased caregiver support          Co-evaluation PT/OT/SLP Co-Evaluation/Treatment: Yes Reason for Co-Treatment: Complexity of the patient's impairments (multi-system involvement);For patient/therapist safety;To address functional/ADL transfers PT goals addressed during session: Mobility/safety with mobility OT goals addressed during session: ADL's and self-care      AM-PAC OT "6 Clicks" Daily Activity     Outcome Measure Help from another person eating meals?: A Little Help from another person taking care of personal grooming?: A Lot Help from another person toileting, which includes using toliet, bedpan, or urinal?: Total Help from another person bathing (  including washing, rinsing, drying)?: Total Help from another person to put on and taking off regular upper body clothing?: Total Help from another person to put on and taking off regular lower body clothing?: Total 6 Click Score: 9   End of Session    Activity Tolerance: Patient limited by fatigue;Patient limited by pain Patient left: in bed;with call bell/phone within reach  OT Visit Diagnosis: Other abnormalities of gait and mobility (R26.89);Muscle weakness (generalized) (M62.81);Other symptoms and signs involving cognitive function;Pain;Dizziness and giddiness (R42) Pain - part of body: (back)                 Time: 0930-1004 OT Time Calculation (min): 34 min Charges:  OT General Charges $OT Visit: 1 Visit OT Evaluation $OT Eval Moderate Complexity: 1 Mod    Britt Bottom 01/03/2018, 11:32 AM

## 2018-01-03 NOTE — Clinical Social Work Note (Signed)
[facility] is under manual review. SNF is starting insurance authorization.   Dayton Scrape, [place]

## 2018-01-03 NOTE — Progress Notes (Signed)
ANTICOAGULATION CONSULT NOTE - [place] for Coumadin Indication: Hx of PE (Apr 2016)  No Known Allergies  Patient Measurements: Height: 5\' 10"  (177.8 cm) Weight: 190 lb (86.2 kg) IBW/kg (Calculated) : 73  Vital Signs: Temp: 98 F (36.7 C) (12/10 0343) BP: 152/74 (12/10 0343) Pulse Rate: 70 (12/10 0343)  Labs: Recent Labs    12/31/17 1253 01/01/18 0457 01/02/18 0451 01/03/18 0449  HGB 14.6 12.5* 11.1*  --   HCT 46.8 39.9 36.4*  --   PLT 344 285 287  --   LABPROT 40.9* 45.3* 28.9* 25.9*  INR 4.34* 4.96* 2.77 2.41  CREATININE 1.38* 1.58* 1.57* 1.29*  CKTOTAL 751*  --   --   --     Estimated Creatinine Clearance: 50.3 mL/min (A) (by C-G formula based on SCr of 1.29 mg/dL (H)).  Assessment: 77 yo M on Coumadin PTA for VTE treatment. INR on admit elevated at 4.34. INR now down to 2.41. Hgb down to 11.1, plt wnl. PTA dose 3mg  daily except 1.5mg  on Sundays  Goal of Therapy:  INR 2-3 Monitor platelets by anticoagulation protocol: Yes    Plan:  Coumadin 3mg  po tonight Monitor daily INR, CBC, s/s of bleed  Sherlon Handing, PharmD, BCPS Clinical pharmacist  **Pharmacist phone directory can now be found on [email or web address] (PW [email or web address]).  Listed under [place]. 01/03/2018 10:56 AM

## 2018-01-03 NOTE — Progress Notes (Signed)
PROGRESS NOTE    [name]  XQJ:[id number] DOB: 10-24-1940 DOA: 12/31/2017 PCP: Lawerance Cruel, MD   Brief Narrative:  [name]  is a 77 y.o. 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  ED he was found to be hypotensive, tachycardic and with elevated lactic acid and WBC count.    Assessment & Plan:   Active Problems:   Cellulitis   Pressure injury of skin   Sepsis secondary to cellulitis with multiple wounds on the lower extremities and back Improving.  Patient was started on broad-spectrum IV antibiotics with IV vancomycin and Zosyn since admission.  He was also started on stress dose steroids at 100 mg every 8 hours of Solu Cortef.  Sepsis improving and blood pressure parameters better we will start tapering down the Solu-Cortef.  Patient is on day 4 of IV antibiotics, transition to oral antibiotics tomorrow and possible discharge in the next 24 hours.  Patient has been afebrile and leukocytosis is improving.  Follow-up blood cultures.  Wound care consulted and recommendations given.  X-rays of the left foot does not show any osteomyelitis.   Pain control and physical therapy recommending SNF placement.  Social worker consulted and plan for discharge in the next 24 to 48 hours.    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 He was started on stress dose steroids, tapering steroids.    Insulin-dependent diabetes mellitus CBG (last 3)  Recent Labs    01/02/18 1705 01/02/18 2051 01/03/18 0742  GLUCAP 116* 123* 83   Last hemoglobin A1c 7.4.  CBGs are better controlled this morning.  Resume Lantus and sliding scale insulin.  No change in medications    Stage III CKD Creatinine at 1.27 today.    History  of hypothyroidism TSH within normal limits Resume with thyroid supplementation.    History of coronary artery disease Patient denies any chest pain or shortness of breath at this time.    History of pulmonary embolism Resume Coumadin as per pharmacy, INR is therapeutic   History of chronic diastolic heart failure Last echo showed preserved left ventricular ejection fraction.  Continue with Coreg at this time.  He appears to be compensated.   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  Neurosurgery consulted recommended rehabilitation versus decompression if pain is not well controlled. Physical therapy evaluation recommending SNF placement.  Outpatient follow-up with neurosurgery if pain does not improve.   History of mild dementia Will need outpatient follow-up with neurology.  DVT prophylaxis: Coumadin Code Status: (Full code Family Communication: None at bedside Disposition Plan: Pending clinical improvement  Consultants:   None   Procedures: None  Antimicrobials: IV vancomycin and Zosyn since admission  Subjective: No new complaints at this time pain well controlled.  Objective: Vitals:   01/02/18 0445 01/02/18 1212 01/02/18 1945 01/03/18 0343  BP:  (!) 146/82 (!) 142/54 (!) 152/74  Pulse: 89 75 76 70  Resp:  18 18 18   Temp:  98.2 F (36.8 C) (!) 97.4 F (36.3 C) 98 F (36.7 C)  TempSrc:  Oral Oral   SpO2: 94% 96% 96% 98%  Weight:    86.2 kg  Height:        Intake/Output Summary (Last 24 hours) at  01/03/2018 1219 Last data filed at 01/02/2018 2206 Gross per 24 hour  Intake 700 ml  Output 240 ml  Net 460 ml   Filed Weights   12/31/17 1216 01/01/18 0439 01/03/18 0343  Weight: 95.3 kg 94.4 kg 86.2 kg    Examination:  General exam: Alert and comfortable laying in bed, not in acute distress at this time  Respiratory system: Air to auscultation bilaterally.  No wheezing or rhonchi     Cardiovascular system: S1 & S2 heard, RRR. No JVD, murmurs,  Gastrointestinal system: Abdomen is soft, nontender, nondistended, bowel sounds are good Central nervous system: Alert and and oriented to place and person only Extremities: Left toe ulcer on the plantar aspect 2 unstageable pressure injuries on the right outer hip Skin: See above Psychiatry:Mood & affect appropriate.     Data Reviewed: I have personally reviewed following labs and imaging studies  CBC: Recent Labs  Lab 12/31/17 1253 01/01/18 0457 01/02/18 0451  WBC 17.4* 15.4* 14.3*  NEUTROABS 12.9*  --   --   HGB 14.6 12.5* 11.1*  HCT 46.8 39.9 36.4*  MCV 79.5* 79.0* 79.3*  PLT 344 285 366   Basic Metabolic Panel: Recent Labs  Lab 12/31/17 1253 01/01/18 0457 01/01/18 1016 01/02/18 0451 01/03/18 0449  NA 137 139  --  140 142  K 3.5 2.8*  --  3.4* 3.1*  CL 103 108  --  114* 116*  CO2 21* 18*  --  19* 17*  GLUCOSE 113* 135*  --  142* 92  BUN 21 19  --  20 20  CREATININE 1.38* 1.58*  --  1.57* 1.29*  CALCIUM 8.6* 7.7*  --  7.8* 7.9*  MG  --   --  1.9  --   --    GFR: Estimated Creatinine Clearance: 50.3 mL/min (A) (by C-G formula based on SCr of 1.29 mg/dL (H)). Liver Function Tests: Recent Labs  Lab 12/31/17 1253  AST 53*  ALT 65*  ALKPHOS 98  BILITOT 1.3*  PROT 6.7  ALBUMIN 2.7*   No results for input(s): LIPASE, AMYLASE in the last 168 hours. No results for input(s): AMMONIA in the last 168 hours. Coagulation Profile: Recent Labs  Lab 12/31/17 1253 01/01/18 0457 01/02/18 0451 01/03/18 0449  INR 4.34* 4.96* 2.77 2.41   Cardiac Enzymes: Recent Labs  Lab 12/31/17 1253  CKTOTAL 751*   BNP (last 3 results) No results for input(s): PROBNP in the last 8760 hours. HbA1C: No results for input(s): HGBA1C in the last 72 hours. CBG: Recent Labs  Lab 01/02/18 0745 01/02/18 1135 01/02/18 1705 01/02/18 2051 01/03/18 0742  GLUCAP 116* 119* 116* 123* 83   Lipid Profile: No results for  input(s): CHOL, HDL, LDLCALC, TRIG, CHOLHDL, LDLDIRECT in the last 72 hours. Thyroid Function Tests: Recent Labs    01/01/18 0457  TSH 1.435   Anemia Panel: Recent Labs    01/02/18 0940  VITAMINB12 1,446*  FOLATE 22.8  FERRITIN 88  TIBC 175*  IRON 13*  RETICCTPCT 1.4   Sepsis Labs: Recent Labs  Lab 12/31/17 1307 01/01/18 1016  LATICACIDVEN 2.33* 1.6    Recent Results (from the past 240 hour(s))  Urine culture     Status: None   Collection Time: 12/31/17 12:53 PM  Result Value Ref Range Status   Specimen Description URINE, RANDOM  Final   Special Requests unknown Normal  Final   Culture   Final    NO GROWTH Performed at [facility], [address] [place], [place] [address]    Report Status 01/01/2018 FINAL  Final  MRSA PCR Screening     Status: Abnormal   Collection Time: 12/31/17  5:38 PM  Result Value Ref Range Status   MRSA by PCR POSITIVE (A) NEGATIVE Final    Comment:        The GeneXpert MRSA Assay (FDA approved for NASAL specimens only), is one component of a comprehensive MRSA colonization surveillance program. It is not intended to diagnose MRSA infection nor to guide or monitor treatment for MRSA infections. CRITICAL RESULT CALLED TO, READ BACK BY AND VERIFIED WITHMarylen Ponto RN [phone] BY GF Performed at [facility], [place] [address] [place],  [address]          Radiology Studies: Dg Foot Complete Left  Result Date: 01/01/2018 CLINICAL DATA:  Skin ulcer of left great toe on the medial side of left foot, unspecified ulcer stage. Pt unsure how long he has had ulcer. Pt denies injury to left foot. EXAM: LEFT FOOT - COMPLETE 3+ VIEW COMPARISON:  None. FINDINGS: No fracture or bone lesion. There is no bone resorption to suggest osteomyelitis. Joints are normally aligned. Small plantar and dorsal calcaneal spurs. There is forefoot soft tissue edema.  No soft tissue air. IMPRESSION: 1. No fracture, bone lesion or evidence of  osteomyelitis. Electronically Signed   By: Lajean Manes M.D.   On: 01/01/2018 20:23        Scheduled Meds: . carvedilol  6.25 mg Oral BID WC  . Chlorhexidine Gluconate Cloth  6 each Topical Q0600  . collagenase   Topical Daily  . ferrous sulfate  325 mg Oral BID WC  . folic acid  0.5 mg Oral Daily  . hydrocortisone sod succinate (SOLU-CORTEF) inj  50 mg Intravenous Q12H  . insulin aspart  0-5 Units Subcutaneous QHS  . insulin aspart  0-9 Units Subcutaneous TID WC  . insulin glargine  26 Units Subcutaneous QHS  . megestrol  20 mg Oral Daily  . methocarbamol  500 mg Oral TID  . mupirocin ointment  1 application Nasal BID  . potassium chloride  40 mEq Oral BID  . senna-docusate  2 tablet Oral BID  . thyroid  60 mg Oral QAC breakfast  . traZODone  100 mg Oral QHS  . vitamin B-12  1,000 mcg Oral Daily  . warfarin  3 mg Oral ONCE-1800  . Warfarin - Pharmacist Dosing Inpatient   Does not apply q1800   Continuous Infusions: . sodium chloride 75 mL/hr at 01/01/18 2139  . piperacillin-tazobactam (ZOSYN)  IV 3.375 g (01/03/18 0647)  . vancomycin 1,250 mg (01/02/18 1752)     LOS: 3 days    Time spent: 28 minutes    Dennis Poisson, MD [facility] Pager [phone]  If 7PM-7AM, please contact night-coverage [email or web address] Password [email or web address] 01/03/2018, 12:19 PM

## 2018-01-04 DIAGNOSIS — Z9861 Coronary angioplasty status: Secondary | ICD-10-CM

## 2018-01-04 DIAGNOSIS — L03116 Cellulitis of left lower limb: Secondary | ICD-10-CM

## 2018-01-04 DIAGNOSIS — I2510 Atherosclerotic heart disease of native coronary artery without angina pectoris: Secondary | ICD-10-CM

## 2018-01-04 DIAGNOSIS — E039 Hypothyroidism, unspecified: Secondary | ICD-10-CM

## 2018-01-04 DIAGNOSIS — D631 Anemia in chronic kidney disease: Secondary | ICD-10-CM

## 2018-01-04 DIAGNOSIS — N183 Chronic kidney disease, stage 3 (moderate): Secondary | ICD-10-CM

## 2018-01-04 LAB — GLUCOSE, CAPILLARY
Glucose-Capillary: 78 mg/dL (ref 70–99)
Glucose-Capillary: 96 mg/dL (ref 70–99)
Glucose-Capillary: 94 mg/dL (ref 70–99)
Glucose-Capillary: 76 mg/dL (ref 70–99)

## 2018-01-04 LAB — PROTIME-INR
INR: 2.33
Prothrombin Time: 25.2 seconds — ABNORMAL HIGH (ref 11.4–15.2)

## 2018-01-04 LAB — BASIC METABOLIC PANEL
Anion gap: 7 (ref 5–15)
BUN: 20 mg/dL (ref 8–23)
CO2: 16 mmol/L — ABNORMAL LOW (ref 22–32)
Calcium: 7.5 mg/dL — ABNORMAL LOW (ref 8.9–10.3)
Chloride: 118 mmol/L — ABNORMAL HIGH (ref 98–111)
Creatinine, Ser: 1.28 mg/dL — ABNORMAL HIGH (ref 0.61–1.24)
GFR calc Af Amer: >60 mL/min (ref >60)
GFR calc non Af Amer: 54 mL/min — ABNORMAL LOW (ref >60)
GLUCOSE: 83 mg/dL (ref 70–99)
Potassium: 3.4 mmol/L — ABNORMAL LOW (ref 3.5–5.1)
Sodium: 141 mmol/L (ref 135–145)

## 2018-01-04 MED ORDER — FOLIC ACID 1 MG PO TABS: 0.5000 mg | ORAL_TABLET | Freq: Every day | ORAL | 0 refills | Status: AC

## 2018-01-04 MED ORDER — SENNOSIDES-DOCUSATE SODIUM 8.6-50 MG PO TABS: 2.0000 | ORAL_TABLET | Freq: Every evening | ORAL | 1 refills | Status: AC | PRN

## 2018-01-04 MED ORDER — SULFAMETHOXAZOLE-TRIMETHOPRIM 800-160 MG PO TABS: 1.0000 | ORAL_TABLET | Freq: Two times a day (BID) | ORAL | 0 refills | Status: DC

## 2018-01-04 MED ORDER — WARFARIN SODIUM 3 MG PO TABS
3.0000 mg | ORAL_TABLET | Freq: Once | ORAL | Status: AC
  Administered 2018-01-04: 3 mg via ORAL
  Filled 2018-01-04: qty 1

## 2018-01-04 MED ORDER — FERROUS SULFATE 325 (65 FE) MG PO TABS: 325.0000 mg | ORAL_TABLET | Freq: Two times a day (BID) | ORAL | 0 refills | Status: AC

## 2018-01-04 NOTE — Plan of Care (Signed)

## 2018-01-04 NOTE — Consult Note (Signed)
   [facility] CM Inpatient Consult   01/04/2018  [name] May 19, 1940 [id number]     Patient assessed for high risk score for unplanned readmissions and for any [facility] services needed. Patient is in the [facility] [place] [facility] services under patient's [facility] plan. Chart review reveals patient is for skilled nursing facility stay. Patient's primary care provider is Dr. Lawerance Cruel. Spoke with inpatient social worker and she states patient to go to skilled nursing.  For questions contact:   Natividad Brood, RN BSN [facility] Liaison  [phone] business mobile phone Toll free office [phone]

## 2018-01-04 NOTE — Discharge Summary (Signed)
Please refer to Discharge summary which is mistakenly labelled as "H&P" from 01/04/18 11:18pm.

## 2018-01-04 NOTE — H&P (Addendum)
Physician Discharge Summary  [name] IRS:[id number] DOB: [birth year] DOA: 12/31/2017  PCP: Lawerance Cruel, MD  Admit date: 12/31/2017 Discharge date: 01/06/2018  Admitted From: Home Disposition: Skilled nursing facility  Recommendations for Outpatient Follow-up:  1. Follow up with PCP in 1-2 weeks 2. Please obtain BMP/CBC in one week your next doctors visit.  3. Folic acid and iron supplements added 4. Steroids have been tapered to home regimen   ADDENDUM 10AM 12/17:  Patient remains stable. Abx course completed here. He is stable for discharge to SNF.  Discharge Condition: Stable CODE STATUS: Full code Diet recommendation: Diabetic  Brief/Interim Summ 77 year old with history of diabetes mellitus type 2, diastolic congestive heart failure, CAD Addison's disease on chronic home steroids came to the hospital with complains of worsening of lower extremity pain especially on the right side and some erythematous spot noted on the lower back, side of the extremity.  He was initially found to have elevated WBC and lactic acid with hypotension and tachycardia.  He was started on sepsis protocol including broad-spectrum antibiotics vancomycin and Zosyn.  He was given stress dose of steroids as well.  Over the course of several days his blood pressure normalized.  X-ray of the foot was negative for any concern of osteomyelitis.  Physical therapy recommended skilled nursing facility.  He remained afebrile and was doing much better.  His steroids were tapered off to the home regimen.  At this point he is reached maximum benefit from hospital stay and stable for discharge with recommendations as stated above.  Discharge Diagnoses:  Active Problems:   Hypothyroidism   CKD (chronic kidney disease) stage 3, GFR 30-59 ml/min (HCC)   Addison disease (HCC)   CAD S/P percutaneous coronary angioplasty   Anemia in CKD (chronic kidney disease)   Depression   Cellulitis   Pressure injury of  skin  Sepsis secondary to cellulitis from multiple infected wound in the lower extremity especially on the right side and his back - Initially he was in septic shock but improved with IV fluids and broad-spectrum antibiotics IV vancomycin and Zosyn.  He was also given stress dose of steroids which was tapered off to his home steroid use.  X-ray of the lower extremity was negative for any osteomyelitis.  He was seen by physical therapy recommended skilled nursing facility therefore arrangements were made. MSRA PCR= + -Completed his antibiotic course in the hospital.  Continue provide local wound care.  Acute history of Addison's disease -She is chronically on steroids at home.  Initially required high steroids here for stress dose and was tapered off to his home regimen.  Insulin-dependent diabetes mellitus type 2 -Resume his home regimen.  History of CKD stage III -This is stable  History of hypothyroidism -Continue Synthroid  History of coronary artery disease -Currently chest pain-free.  History of pulmonary embolism -Continue Coumadin  History of chronic diastolic congestive heart failure -Appears to be well compensated without any issues.  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  History of mild dementia -Provide supportive care and follow-up outpatient with neurology  Patient on Coumadin for DVT prophylaxis Full code   Discharge Instructions   Allergies as of 01/06/2018   No Known Allergies     Medication List    STOP taking these medications   lidocaine 5 % Commonly known as:  LIDODERM   methocarbamol 500 MG tablet  Commonly known as:  ROBAXIN   ondansetron 4 MG tablet Commonly known as:  ZOFRAN   oxyCODONE 5 MG immediate release tablet Commonly known as:  Oxy IR/ROXICODONE     TAKE these medications    acetaminophen 500 MG tablet Commonly known as:  TYLENOL Take 500 mg by mouth every 6 (six) hours as needed for headache (pain).   CALCIUM-MAGNESIUM-ZINC PO Take 1 tablet by mouth daily.   carvedilol 6.25 MG tablet Commonly known as:  COREG Take 1 tablet (6.25 mg total) by mouth 2 (two) times daily with a meal.   ferrous sulfate 325 (65 FE) MG tablet Take 1 tablet (325 mg total) by mouth 2 (two) times daily with a meal.   folic acid 1 MG tablet Commonly known as:  FOLVITE Take 0.5 tablets (0.5 mg total) by mouth daily.   furosemide 20 MG tablet Commonly known as:  LASIX Take 1 tablet (20 mg total) by mouth every Monday, Wednesday, and Friday. What changed:  when to take this   gabapentin 600 MG tablet Commonly known as:  NEURONTIN Take 600 mg by mouth 3 (three) times daily.   hydrocortisone 20 MG tablet Commonly known as:  CORTEF Take 10-20 mg by mouth See admin instructions. Take 1 tablet (20 mg) by mouth every morning, take 1/2 tablet (10 mg) at noon and take 1 tablet (20 mg) at night   Insulin Glargine 100 UNIT/ML Solostar Pen Commonly known as:  LANTUS Inject 26 Units into the skin at bedtime.   megestrol 40 MG tablet Commonly known as:  MEGACE Take 20 mg by mouth daily. For appetite What changed:  Another medication with the same name was removed. Continue taking this medication, and follow the directions you see here.   multivitamin with minerals Tabs tablet Take 1 tablet by mouth 2 (two) times daily. Life Extension MultiVitamin   nitroGLYCERIN 0.4 MG SL tablet Commonly known as:  NITROSTAT PLACE 1 TABLET UNDER TONGUE EVERY 5 MINUTES AS NEEDED FOR CHEST PAIN (UP TO 3 DOSES) What changed:    how much to take  how to take this  when to take this  reasons to take this  additional instructions   OXYGEN Inhale 2 L into the lungs continuous.   potassium chloride 10 MEQ tablet Commonly known as:  K-DUR,KLOR-CON Take 10 mEq by mouth daily.    promethazine 25 MG tablet Commonly known as:  PHENERGAN Take 25 mg by mouth every 6 (six) hours as needed for nausea or vomiting.   senna-docusate 8.6-50 MG tablet Commonly known as:  Senokot-S Take 2 tablets by mouth at bedtime as needed for mild constipation or moderate constipation. What changed:    when to take this  reasons to take this   thyroid 60 MG tablet Commonly known as:  ARMOUR Take 60 mg by mouth daily.   traMADol 50 MG tablet Commonly known as:  ULTRAM Take 100 mg by mouth 3 (three) times daily.   traZODone 100 MG tablet Commonly known as:  DESYREL Take 100 mg by mouth at bedtime.   VITAMIN B12-FOLIC ACID PO Take 1 tablet by mouth daily.   Vitamin D-3 25 MCG (1000 UT) Caps Take 1,000 Units by mouth daily.   VITAMIN E PO Take 1 capsule by mouth daily.   warfarin 3 MG tablet Commonly known as:  COUMADIN Take as directed. If you are unsure how to take this medication, talk to your nurse or doctor. Original instructions:  TAKE AS DIRECTED BY COUMADIN  CLINIC What changed:    how much to take  how to take this  when to take this  additional instructions       Contact information for follow-up providers    Lawerance Cruel, MD. Schedule an appointment as soon as possible for a visit in 1 week(s).   Specialty:  Family Medicine Contact information: [place] [address] [phone]        [name], Wonda Cheng, MD .   Specialty:  Cardiology Contact information: [place] [address] [phone]            Contact information for after-discharge care    Destination    [facility] Preferred SNF .   Service:  Skilled Nursing Contact information: [place] [phone]                 No Known Allergies  You were cared for by a hospitalist during your hospital stay. If you have any questions about your discharge  medications or the care you received while you were in the hospital after you are discharged, you can call the unit and asked to speak with the hospitalist on call if the hospitalist that took care of you is not available. Once you are discharged, your primary care physician will handle any further medical issues. Please note that no refills for any discharge medications will be authorized once you are discharged, as it is imperative that you return to your primary care physician (or establish a relationship with a primary care physician if you do not have one) for your aftercare needs so that they can reassess your need for medications and monitor your lab values.  Consultations:  Wound Care   Procedures/Studies: Dg Chest Port 1 View  Result Date: 12/31/2017 CLINICAL DATA:  Altered level of consciousness, status fall EXAM: PORTABLE CHEST 1 VIEW COMPARISON:  None. FINDINGS: The heart size and mediastinal contours are within normal limits. Both lungs are clear. The visualized skeletal structures are unremarkable. IMPRESSION: No active disease. Electronically Signed   By: [name]   On: 12/31/2017 13:38   Dg Foot Complete Left  Result Date: 01/01/2018 CLINICAL DATA:  Skin ulcer of left great toe on the medial side of left foot, unspecified ulcer stage. Pt unsure how long he has had ulcer. Pt denies injury to left foot. EXAM: LEFT FOOT - COMPLETE 3+ VIEW COMPARISON:  None. FINDINGS: No fracture or bone lesion. There is no bone resorption to suggest osteomyelitis. Joints are normally aligned. Small plantar and dorsal calcaneal spurs. There is forefoot soft tissue edema.  No soft tissue air. IMPRESSION: 1. No fracture, bone lesion or evidence of osteomyelitis. Electronically Signed   By: Lajean Manes M.D.   On: 01/01/2018 20:23     Subjective: Feels better and ready for rehab. Wife is at the bedside.    Discharge Exam: Vitals:   01/06/18 0352 01/06/18 0556  BP: (!) 181/97 (!) 155/99  Pulse:  83   Resp: 18   Temp: 98.7 F (37.1 C)   SpO2: 95%    Vitals:   01/05/18 1751 01/05/18 2118 01/06/18 0352 01/06/18 0556  BP: (!) 159/91 133/86 (!) 181/97 (!) 155/99  Pulse: 91 89 83   Resp:  18 18   Temp:  98.8 F (37.1 C) 98.7 F (37.1 C)   TempSrc:  Oral Oral   SpO2:  95% 95%   Weight:  82.1 kg   Height:        General: Pt is alert, awake, not in acute distress Cardiovascular: RRR, S1/S2 +, no rubs, no gallops Respiratory: CTA bilaterally, no wheezing, no rhonchi Abdominal: Soft, NT, ND, bowel sounds + Extremities: no edema, no cyanosis LE wound dressing noted. No obivous bleeding or drainage.    The results of significant diagnostics from this hospitalization (including imaging, microbiology, ancillary and laboratory) are listed below for reference.     Microbiology: Recent Results (from the past 240 hour(s))  Urine culture     Status: None   Collection Time: 12/31/17 12:53 PM  Result Value Ref Range Status   Specimen Description URINE, RANDOM  Final   Special Requests unknown Normal  Final   Culture   Final    NO GROWTH Performed at [facility], [address], [place], [place] [address]    Report Status 01/01/2018 FINAL  Final  MRSA PCR Screening     Status: Abnormal   Collection Time: 12/31/17  5:38 PM  Result Value Ref Range Status   MRSA by PCR POSITIVE (A) NEGATIVE Final    Comment:        The GeneXpert MRSA Assay (FDA approved for NASAL specimens only), is one component of a comprehensive MRSA colonization surveillance program. It is not intended to diagnose MRSA infection nor to guide or monitor treatment for MRSA infections. CRITICAL RESULT CALLED TO, READ BACK BY AND VERIFIED WITHMarylen Ponto RN [phone] 2111 BY GF Performed at [facility], [place] [address], [place], [place] [address]      Labs: BNP (last 3 results) Recent Labs    04/22/17 2013 07/07/17 0750  BNP 35.3 43.3   Basic Metabolic Panel: Recent Labs  Lab  01/01/18 1016 01/02/18 0451 01/03/18 0449 01/04/18 0506 01/05/18 0408 01/06/18 0713  NA  --  140 142 141 138 138  K  --  3.4* 3.1* 3.4* 3.0* 3.5  CL  --  114* 116* 118* 112* 111  CO2  --  19* 17* 16* 17* 17*  GLUCOSE  --  142* 92 83 68* 108*  BUN  --  20 20 20 14 13   CREATININE  --  1.57* 1.29* 1.28* 1.12 1.14  CALCIUM  --  7.8* 7.9* 7.5* 7.4* 7.6*  MG 1.9  --   --   --   --  1.9   Liver Function Tests: Recent Labs  Lab 12/31/17 1253  AST 53*  ALT 65*  ALKPHOS 98  BILITOT 1.3*  PROT 6.7  ALBUMIN 2.7*   No results for input(s): LIPASE, AMYLASE in the last 168 hours. No results for input(s): AMMONIA in the last 168 hours. CBC: Recent Labs  Lab 12/31/17 1253 01/01/18 0457 01/02/18 0451 01/03/18 1007  WBC 17.4* 15.4* 14.3* 14.0*  NEUTROABS 12.9*  --   --   --   HGB 14.6 12.5* 11.1* 11.6*  HCT 46.8 39.9 36.4* 38.1*  MCV 79.5* 79.0* 79.3* 81.4  PLT 344 285 287 299   Cardiac Enzymes: Recent Labs  Lab 12/31/17 1253  CKTOTAL 751*   BNP: Invalid input(s): POCBNP CBG: Recent Labs  Lab 01/05/18 0904 01/05/18 1140 01/05/18 1651 01/05/18 2310 01/06/18 0826  GLUCAP 102* 96 117* 76 89   D-Dimer No results for input(s): DDIMER in the last 72 hours. Hgb A1c No results for input(s): HGBA1C in the last 72 hours. Lipid Profile No results for input(s): CHOL, HDL, LDLCALC, TRIG, CHOLHDL, LDLDIRECT in  the last 72 hours. Thyroid function studies No results for input(s): TSH, T4TOTAL, T3FREE, THYROIDAB in the last 72 hours.  Invalid input(s): FREET3 Anemia work up No results for input(s): VITAMINB12, FOLATE, FERRITIN, TIBC, IRON, RETICCTPCT in the last 72 hours. Urinalysis    Component Value Date/Time   COLORURINE YELLOW 12/31/2017 1253   APPEARANCEUR CLEAR 12/31/2017 1253   LABSPEC 1.017 12/31/2017 1253   PHURINE 5.0 12/31/2017 1253   GLUCOSEU NEGATIVE 12/31/2017 1253   HGBUR NEGATIVE 12/31/2017 1253   BILIRUBINUR NEGATIVE 12/31/2017 1253   KETONESUR  NEGATIVE 12/31/2017 1253   PROTEINUR NEGATIVE 12/31/2017 1253   UROBILINOGEN 0.2 03/21/2007 1015   NITRITE NEGATIVE 12/31/2017 1253   LEUKOCYTESUR NEGATIVE 12/31/2017 1253   Sepsis Labs Invalid input(s): PROCALCITONIN,  WBC,  LACTICIDVEN Microbiology Recent Results (from the past 240 hour(s))  Urine culture     Status: None   Collection Time: 12/31/17 12:53 PM  Result Value Ref Range Status   Specimen Description URINE, RANDOM  Final   Special Requests unknown Normal  Final   Culture   Final    NO GROWTH Performed at [facility], [place] [address], [place], [place] [address]    Report Status 01/01/2018 FINAL  Final  MRSA PCR Screening     Status: Abnormal   Collection Time: 12/31/17  5:38 PM  Result Value Ref Range Status   MRSA by PCR POSITIVE (A) NEGATIVE Final    Comment:        The GeneXpert MRSA Assay (FDA approved for NASAL specimens only), is one component of a comprehensive MRSA colonization surveillance program. It is not intended to diagnose MRSA infection nor to guide or monitor treatment for MRSA infections. CRITICAL RESULT CALLED TO, READ BACK BY AND VERIFIED WITHMarylen Ponto RN [phone] 2111 BY GF Performed at [facility], [place] [address] [place], [place] [address]      Time coordinating discharge:  I have spent 35 minutes face to face with the patient and on the ward discussing the patients care, assessment, plan and disposition with other care givers. >50% of the time was devoted counseling the patient about the risks and benefits of treatment/Discharge disposition and coordinating care.   SIGNED:   Damita Lack, MD  [facility] 01/06/2018, 10:10 AM Pager   If 7PM-7AM, please contact night-coverage [email or web address] Password [email or web address]

## 2018-01-04 NOTE — Clinical Social Work Note (Addendum)
Uploaded requested documents in to [facility] Must for PASARR review. Insurance authorization approved for SNF: [id number]. MD aware.  Dayton Scrape, CSW [phone]  2:30 pm PASARR under level II review. Someone from the state will have to come assess patient before giving PASARR number. MD aware.  Dayton Scrape, [place]  3:08 pm Left voicemail for patient's wife. Will provide update when she returns call.  Dayton Scrape, CSW [phone]  3:45 pm Spoke to patient's wife over the phone and provided update.  Dayton Scrape, [place]

## 2018-01-04 NOTE — Progress Notes (Signed)
ANTICOAGULATION CONSULT NOTE - [facility] for Coumadin Indication: Hx of PE (Apr 2016)  No Known Allergies  Patient Measurements: Height: 5\' 10"  (177.8 cm) Weight: 193 lb (87.5 kg) IBW/kg (Calculated) : 73  Vital Signs: Temp: 97.5 F (36.4 C) (12/11 0619) Temp Source: Oral (12/11 0619) BP: 173/95 (12/11 0619) Pulse Rate: 72 (12/11 0619)  Labs: Recent Labs    01/02/18 0451 01/03/18 0449 01/03/18 1007 01/04/18 0506  HGB 11.1*  --  11.6*  --   HCT 36.4*  --  38.1*  --   PLT 287  --  299  --   LABPROT 28.9* 25.9*  --  25.2*  INR 2.77 2.41  --  2.33  CREATININE 1.57* 1.29*  --  1.28*    Estimated Creatinine Clearance: 50.7 mL/min (A) (by C-G formula based on SCr of 1.28 mg/dL (H)).  Assessment: 77 yo M on Coumadin PTA for VTE treatment. INR on admit elevated at 4.34. INR 2.33. Hgb down to 11.6, plt wnl. PTA dose 3mg  daily except 1.5mg  on Sundays  Goal of Therapy:  INR 2-3 Monitor platelets by anticoagulation protocol: Yes    Plan:  Coumadin 3mg  po again tonight Monitor daily INR, CBC, s/s of bleed  Sherlon Handing, PharmD, BCPS Clinical pharmacist  **Pharmacist phone directory can now be found on [email or web address] (PW [email or web address]).  Listed under [place]. 01/04/2018 8:46 AM

## 2018-01-05 LAB — PROTIME-INR
INR: 2.11
Prothrombin Time: 23.3 seconds — ABNORMAL HIGH (ref 11.4–15.2)

## 2018-01-05 LAB — GLUCOSE, CAPILLARY
Glucose-Capillary: 67 mg/dL — ABNORMAL LOW (ref 70–99)
Glucose-Capillary: 102 mg/dL — ABNORMAL HIGH (ref 70–99)
Glucose-Capillary: 96 mg/dL (ref 70–99)
Glucose-Capillary: 117 mg/dL — ABNORMAL HIGH (ref 70–99)
Glucose-Capillary: 76 mg/dL (ref 70–99)

## 2018-01-05 LAB — BASIC METABOLIC PANEL
Anion gap: 9 (ref 5–15)
BUN: 14 mg/dL (ref 8–23)
CALCIUM: 7.4 mg/dL — AB (ref 8.9–10.3)
CO2: 17 mmol/L — ABNORMAL LOW (ref 22–32)
Chloride: 112 mmol/L — ABNORMAL HIGH (ref 98–111)
Creatinine, Ser: 1.12 mg/dL (ref 0.61–1.24)
GFR calc Af Amer: >60 mL/min (ref >60)
GFR calc non Af Amer: >60 mL/min (ref >60)
Glucose, Bld: 68 mg/dL — ABNORMAL LOW (ref 70–99)
Potassium: 3 mmol/L — ABNORMAL LOW (ref 3.5–5.1)
SODIUM: 138 mmol/L (ref 135–145)

## 2018-01-05 MED ORDER — SULFAMETHOXAZOLE-TRIMETHOPRIM 800-160 MG PO TABS: 1.0000 | ORAL_TABLET | Freq: Two times a day (BID) | ORAL | 0 refills | Status: DC

## 2018-01-05 MED ORDER — WARFARIN SODIUM 3 MG PO TABS
3.0000 mg | ORAL_TABLET | Freq: Once | ORAL | Status: AC
  Administered 2018-01-05: 3 mg via ORAL
  Filled 2018-01-05: qty 1

## 2018-01-05 MED ORDER — POTASSIUM CHLORIDE CRYS ER 20 MEQ PO TBCR
40.0000 meq | EXTENDED_RELEASE_TABLET | Freq: Once | ORAL | Status: AC
  Administered 2018-01-05: 40 meq via ORAL
  Filled 2018-01-05: qty 2

## 2018-01-05 NOTE — Progress Notes (Signed)
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  He was diagnosed with cellulitis.  Hospital course complicated by septic shock but improved with fluids and steroids.  Now IV medications have been transitioned to p.o.  Discharge to skilled nursing facility.   Assessment & Plan:   Active Problems:   Hypothyroidism   CKD (chronic kidney disease) stage 3, GFR 30-59 ml/min (HCC)   Addison disease (HCC)   CAD S/P percutaneous coronary angioplasty   Anemia in CKD (chronic kidney disease)   Depression   Cellulitis   Pressure injury of skin  Sepsis secondary to right lower extremity cellulitis in the right side of his back - Doing better, will transition him from IV antibiotics to oral Bactrim for 3 more days.  Discharging to skilled nursing facility.  For details refer to discharge summary from yesterday  History of Addison's disease - Resume home steroids  Insulin-dependent diabetes mellitus type 2 -Resume home regimen  CKD stage III, stable  Hypothyroidism-continue Synthroid  Coronary artery disease-continue home meds, currently chest pain-free  History of pulmonary embolism-continue Coumadin  Chronic diastolic congestive heart failure-appears well compensated.  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  Patient being discharged.    DVT prophylaxis: Coumadin Code Status: Full code Family Communication: None at bedside Disposition Plan: Discharge today  Consultants:   None  Procedures:   None   Subjective: No complaints, feels better.  No other acute events overnight.  Review of Systems Otherwise negative except as per  HPI, including: General: Denies fever, chills, night sweats or unintended weight loss. Resp: Denies cough, wheezing, shortness of breath. Cardiac: Denies chest pain, palpitations, orthopnea, paroxysmal nocturnal dyspnea. GI: Denies abdominal pain, nausea, vomiting, diarrhea or constipation GU: Denies dysuria, frequency, hesitancy or incontinence MS: Denies muscle aches, joint pain or swelling Neuro: Denies headache, neurologic deficits (focal weakness, numbness, tingling), abnormal gait Psych: Denies anxiety, depression, SI/HI/AVH Skin: Denies new rashes or lesions ID: Denies sick contacts, exotic exposures, travel  Objective: Vitals:   01/04/18 1645 01/04/18 1930 01/05/18 0624 01/05/18 0917  BP: (!) 156/86 139/87 (!) 142/68 108/70  Pulse: 75 70 76 81  Resp:  20 20   Temp:  97.8 F (36.6 C) 97.9 F (36.6 C)   TempSrc:  Oral Oral   SpO2: 97% 97% 96%   Weight:   85.3 kg   Height:        Intake/Output Summary (Last 24 hours) at 01/05/2018 1021 Last data filed at 01/05/2018 0928 Gross per 24 hour  Intake 2475.69 ml  Output 3550 ml  Net -1074.31 ml   Filed Weights   01/03/18 0343 01/04/18 0619 01/05/18 0624  Weight: 86.2 kg 87.5 kg 85.3 kg    Examination:  General exam: Appears calm and comfortable  Respiratory system: Clear to auscultation. Respiratory effort normal. Cardiovascular system: S1 & S2 heard, RRR. No JVD, murmurs, rubs, gallops or clicks. No pedal edema. Gastrointestinal system: Abdomen is nondistended, soft and nontender. No organomegaly or masses felt. Normal bowel sounds heard. Central nervous system: Alert and oriented. No focal neurological deficits. Extremities: Symmetric 5 x 5 power. Skin: Right lower extremity dressing noted without any evidence of active bleeding. Psychiatry: Judgement and insight appear  normal. Mood & affect appropriate.     Data Reviewed:   CBC: Recent Labs  Lab 12/31/17 1253 01/01/18 0457 01/02/18 0451 01/03/18 1007    WBC 17.4* 15.4* 14.3* 14.0*  NEUTROABS 12.9*  --   --   --   HGB 14.6 12.5* 11.1* 11.6*  HCT 46.8 39.9 36.4* 38.1*  MCV 79.5* 79.0* 79.3* 81.4  PLT 344 285 287 253   Basic Metabolic Panel: Recent Labs  Lab 01/01/18 0457 01/01/18 1016 01/02/18 0451 01/03/18 0449 01/04/18 0506 01/05/18 0408  NA 139  --  140 142 141 138  K 2.8*  --  3.4* 3.1* 3.4* 3.0*  CL 108  --  114* 116* 118* 112*  CO2 18*  --  19* 17* 16* 17*  GLUCOSE 135*  --  142* 92 83 68*  BUN 19  --  20 20 20 14   CREATININE 1.58*  --  1.57* 1.29* 1.28* 1.12  CALCIUM 7.7*  --  7.8* 7.9* 7.5* 7.4*  MG  --  1.9  --   --   --   --    GFR: Estimated Creatinine Clearance: 57.9 mL/min (by C-G formula based on SCr of 1.12 mg/dL). Liver Function Tests: Recent Labs  Lab 12/31/17 1253  AST 53*  ALT 65*  ALKPHOS 98  BILITOT 1.3*  PROT 6.7  ALBUMIN 2.7*   No results for input(s): LIPASE, AMYLASE in the last 168 hours. No results for input(s): AMMONIA in the last 168 hours. Coagulation Profile: Recent Labs  Lab 01/01/18 0457 01/02/18 0451 01/03/18 0449 01/04/18 0506 01/05/18 0408  INR 4.96* 2.77 2.41 2.33 2.11   Cardiac Enzymes: Recent Labs  Lab 12/31/17 1253  CKTOTAL 751*   BNP (last 3 results) No results for input(s): PROBNP in the last 8760 hours. HbA1C: No results for input(s): HGBA1C in the last 72 hours. CBG: Recent Labs  Lab 01/04/18 1251 01/04/18 1647 01/04/18 2129 01/05/18 0730 01/05/18 0904  GLUCAP 96 94 76 67* 102*   Lipid Profile: No results for input(s): CHOL, HDL, LDLCALC, TRIG, CHOLHDL, LDLDIRECT in the last 72 hours. Thyroid Function Tests: No results for input(s): TSH, T4TOTAL, FREET4, T3FREE, THYROIDAB in the last 72 hours. Anemia Panel: No results for input(s): VITAMINB12, FOLATE, FERRITIN, TIBC, IRON, RETICCTPCT in the last 72 hours. Sepsis Labs: Recent Labs  Lab 12/31/17 1307 01/01/18 1016  LATICACIDVEN 2.33* 1.6    Recent Results (from the past 240 hour(s))  Urine  culture     Status: None   Collection Time: 12/31/17 12:53 PM  Result Value Ref Range Status   Specimen Description URINE, RANDOM  Final   Special Requests unknown Normal  Final   Culture   Final    NO GROWTH Performed at [facility], [place] [address] [place], [place] [address]    Report Status 01/01/2018 FINAL  Final  MRSA PCR Screening     Status: Abnormal   Collection Time: 12/31/17  5:38 PM  Result Value Ref Range Status   MRSA by PCR POSITIVE (A) NEGATIVE Final    Comment:        The GeneXpert MRSA Assay (FDA approved for NASAL specimens only), is one component of a comprehensive MRSA colonization surveillance program. It is not intended to diagnose MRSA infection nor to guide or monitor treatment for MRSA infections. CRITICAL RESULT CALLED TO, READ BACK BY AND VERIFIED WITHMarylen Ponto RN [phone] 2111 BY GF Performed at [facility], [place] [address], [place], [place]  [place]          Radiology Studies: No results found.      Scheduled Meds: . carvedilol  6.25 mg Oral BID WC  . Chlorhexidine Gluconate Cloth  6 each Topical Q0600  . collagenase   Topical Daily  . ferrous sulfate  325 mg Oral BID WC  . folic acid  0.5 mg Oral Daily  . hydrocortisone sod succinate (SOLU-CORTEF) inj  50 mg Intravenous Q12H  . insulin aspart  0-5 Units Subcutaneous QHS  . insulin aspart  0-9 Units Subcutaneous TID WC  . insulin glargine  26 Units Subcutaneous QHS  . megestrol  20 mg Oral Daily  . methocarbamol  500 mg Oral TID  . senna-docusate  2 tablet Oral BID  . thyroid  60 mg Oral QAC breakfast  . traZODone  100 mg Oral QHS  . vitamin B-12  1,000 mcg Oral Daily  . Warfarin - Pharmacist Dosing Inpatient   Does not apply q1800   Continuous Infusions: . sodium chloride 75 mL/hr at 01/05/18 0300  . sodium chloride 250 mL (01/05/18 0636)  . piperacillin-tazobactam (ZOSYN)  IV 3.375 g (01/05/18 2536)  . vancomycin Stopped (01/04/18 2017)     LOS: 5 days    Time spent= 15 mins    Kamil Mchaffie Arsenio Loader, MD [facility] Pager [phone]   If 7PM-7AM, please contact night-coverage [email or web address] Password [facility] 01/05/2018, 10:21 AM

## 2018-01-05 NOTE — Progress Notes (Signed)
Capillary blood glucose 67 this am, gave orange juice and 1 pack of graham crackers, recheck=102.  Night shift RN, also held lantus last night.

## 2018-01-05 NOTE — Progress Notes (Signed)
ANTICOAGULATION + ANTIBIOTIC CONSULT NOTE - Follow Up Consult  Pharmacy Consult for Coumadin Indication: hx of PE (April 2016)  No Known Allergies  Patient Measurements: Height: 5\' 10"  (177.8 cm) Weight: 188 lb (85.3 kg) IBW/kg (Calculated) : 73  Vital Signs: Temp: 97.8 F (36.6 C) (12/12 1059) Temp Source: Oral (12/12 1059) BP: 123/72 (12/12 1059) Pulse Rate: 88 (12/12 1059)  Labs: Recent Labs    01/03/18 0449 01/03/18 1007 01/04/18 0506 01/05/18 0408  HGB  --  11.6*  --   --   HCT  --  38.1*  --   --   PLT  --  299  --   --   LABPROT 25.9*  --  25.2* 23.3*  INR 2.41  --  2.33 2.11  CREATININE 1.29*  --  1.28* 1.12    Estimated Creatinine Clearance: 57.9 mL/min (by C-G formula based on SCr of 1.12 mg/dL).  Assessment:  77 yr old male continues on Coumadin as PTA for hx PE.  INR supratherapeutic (4.34) on admit 12/7.  Coumadin held x 2 days then resumed on 12/9 when INR 2.77.  INR has trended down to 2.11 but remains therapeutic.    Home regimen:  3 mg daily except 1.5 mg on Sundays.    Day # 6 Vancomycin and Zosyn for sepsis coverage d/t cellulitis.   Oral Bactrim planned after discharge to complete course.  Usually try to avoid Bactrim while on Coumadin, as it can significantly increase INR. Discussed with Dr. Amin.  To continue IV antibiotics while inpatient. May not need antibiotic at discharge, or only for 1 more day.  Creatinine improved to 1.12.      Antibiotics this admission:     Vancomycin 12/7>>     Zosyn 12/7>>     Cultures this admission:       12/7 MRSA PCR - positive - > CHG/Bactroban x 5 days (12/8>>12/12)       12 /7 urine cx - negative  Goal of Therapy:  INR 2-3 Monitor platelets by anticoagulation protocol: Yes   Plan:   Coumadin 3 mg today, usual dose.  Daily PT/INR for now.  Continue Vancomycin 1250 mg IV q24hrs.  No levels planned, as course to end soon.  Continue Zosyn 3.375 gm IV q8hrs (each over 4 hours)    [name],  [place] Pager: [phone] 01/05/2018,11:43 AM

## 2018-01-05 NOTE — Clinical Social Work Note (Signed)
Ms. [name] with [facility] will be by late this afternoon for level II PASARR evaluation.  [name], [place]

## 2018-01-06 LAB — BASIC METABOLIC PANEL
ANION GAP: 10 (ref 5–15)
BUN: 13 mg/dL (ref 8–23)
CO2: 17 mmol/L — ABNORMAL LOW (ref 22–32)
Calcium: 7.6 mg/dL — ABNORMAL LOW (ref 8.9–10.3)
Chloride: 111 mmol/L (ref 98–111)
Creatinine, Ser: 1.14 mg/dL (ref 0.61–1.24)
GFR calc non Af Amer: >60 mL/min (ref >60)
Glucose, Bld: 108 mg/dL — ABNORMAL HIGH (ref 70–99)
Potassium: 3.5 mmol/L (ref 3.5–5.1)
SODIUM: 138 mmol/L (ref 135–145)

## 2018-01-06 LAB — PROTIME-INR
INR: 2.18
Prothrombin Time: 23.9 seconds — ABNORMAL HIGH (ref 11.4–15.2)

## 2018-01-06 LAB — GLUCOSE, CAPILLARY
Glucose-Capillary: 89 mg/dL (ref 70–99)
Glucose-Capillary: 142 mg/dL — ABNORMAL HIGH (ref 70–99)
Glucose-Capillary: 133 mg/dL — ABNORMAL HIGH (ref 70–99)
Glucose-Capillary: 114 mg/dL — ABNORMAL HIGH (ref 70–99)

## 2018-01-06 LAB — MAGNESIUM: MAGNESIUM: 1.9 mg/dL (ref 1.7–2.4)

## 2018-01-06 MED ORDER — POTASSIUM CHLORIDE CRYS ER 20 MEQ PO TBCR
40.0000 meq | EXTENDED_RELEASE_TABLET | Freq: Once | ORAL | Status: AC
  Administered 2018-01-06: 40 meq via ORAL
  Filled 2018-01-06: qty 2

## 2018-01-06 NOTE — Progress Notes (Signed)
Physical Therapy Treatment Patient Details Name: [name] MRN: [id number] DOB: 08/19/1940 Today's Date: 01/06/2018    History of Present Illness [name]  is a 77 y.o. 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     PT Comments    Limited bed level mobility today due to significant pain and spasm in low back.  [name] condom catheter was leaking and he needed changing and peri care.  RN assisting, however, with each transition, pt's back would spasm and he would draw up (reflexively) into bil knee and hip flexion until the spasm passed.  He was attempting to help roll, but with each spasm he would push posteriorly in the opposite direction.  He remains appropriate for SNF placement.  PT will continue to follow acutely for safe mobility progression.  He will need pre medication next session.   Follow Up Recommendations  SNF;Supervision/Assistance - 24 hour     Equipment Recommendations  Wheelchair (measurements PT);Wheelchair cushion (measurements PT);Hospital bed;Other (comment)(hoyler lift)    Recommendations for Other Services   NA    Precautions / Restrictions Precautions Precautions: Fall Precaution Comments: R hip and  L foot ulcers    Mobility  Bed Mobility Overal bed mobility: Needs Assistance Bed Mobility: Rolling Rolling: +2 for physical assistance;Max assist         General bed mobility comments: Two person max assist to preform peri care as pt's condom catheter was leaking.  He was having significant muslce spasms that would hit him and both legs and he would draw up into flexion repeatedly during multiple attempts to move pt in bed to get him clean.    Transfers                 General transfer comment: EOB  not attemppted today as pt screaming in pain with each muscle spasm.           Cognition Arousal/Alertness: Awake/alert Behavior During Therapy: WFL for tasks assessed/performed Overall Cognitive Status: Impaired/Different from baseline Area of Impairment: Orientation;Memory                 Orientation Level: Disoriented to;Time(has no idea what month it is currently)   Memory: Decreased short-term memory(did not remember sitting EOB with PT)                       Pertinent Vitals/Pain Pain Assessment: Faces Faces Pain Scale: Hurts worst Pain Location: back and LE spasms Pain Descriptors / Indicators: Aching;Grimacing;Guarding;Crying Pain Intervention(s): Limited activity within patient's tolerance;Monitored during session;Repositioned(RN in room with pain meds and muscle relaxer)           PT Goals (current goals can now be found in the care plan section) Acute Rehab PT Goals Patient Stated Goal: to decrease pain Progress towards PT goals: Not progressing toward goals - comment(limited today by significant pain and spasm)    Frequency    Min 2X/week      PT Plan Current plan remains appropriate       AM-PAC PT "6 Clicks" Mobility   Outcome Measure  Help needed turning from your back to your side while in a flat bed without using bedrails?: Total Help needed moving from lying on your back to sitting on the side of a flat bed  without using bedrails?: Total Help needed moving to and from a bed to a chair (including a wheelchair)?: Total Help needed standing up from a chair using your arms (e.g., wheelchair or bedside chair)?: Total Help needed to walk in hospital room?: Total Help needed climbing 3-5 steps with a railing? : Total 6 Click Score: 6    End of Session   Activity Tolerance: Patient limited by pain;Other (comment)(and LE muscle spasm) Patient left: in bed;with nursing/sitter in room;Other (comment)(RN attempting to give meds and fix his  condom cath)   PT Visit Diagnosis: Muscle weakness (generalized) (M62.81);History of falling (Z91.81)     Time: 1024-1050 PT Time Calculation (min) (ACUTE ONLY): 26 min  Charges:  $Therapeutic Activity: 23-37 mins                    Baila Rouse B. Marcoantonio Legault, PT, DPT  Acute Rehabilitation [phone] pager #[phone] office   01/06/2018, 11:00 AM

## 2018-01-06 NOTE — Progress Notes (Signed)
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  He was diagnosed with cellulitis.  Hospital course complicated by septic shock but improved with fluids and steroids.  While awaiting transitioning patient to skilled nursing facility he ended up completing his IV antibiotic course in the hospital.   Assessment & Plan:   Active Problems:   Hypothyroidism   CKD (chronic kidney disease) stage 3, GFR 30-59 ml/min (HCC)   Addison disease (HCC)   CAD S/P percutaneous coronary angioplasty   Anemia in CKD (chronic kidney disease)   Depression   Cellulitis   Pressure injury of skin  Sepsis secondary to right lower extremity cellulitis in the right side of his back - While awaiting bed at skilled nursing facility ended up completing his antibiotic course in the hospital.  Discharging to skilled nursing facility.  For details refer to discharge summary from yesterday  Hypokalemia-replete  History of Addison's disease - Resume home steroids  Insulin-dependent diabetes mellitus type 2 -Resume home regimen  CKD stage III, stable  Hypothyroidism-continue Synthroid  Coronary artery disease-continue home meds, currently chest pain-free  History of pulmonary embolism-continue Coumadin, INR today is 3.81  Chronic diastolic congestive heart failure-appears well compensated.  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  Patient being discharged.    DVT prophylaxis: Coumadin Code Status: Full code Family Communication: None at bedside Disposition Plan: Discharge when bed available  Consultants:   None  Procedures:   None   Subjective: No  acute events overnight, feels better.  Review of Systems Otherwise negative except as per HPI, including: General = no fevers, chills, dizziness, malaise, fatigue HEENT/EYES = negative for pain, redness, loss of vision, double vision, blurred vision, loss of hearing, sore throat, hoarseness, dysphagia Cardiovascular= negative for chest pain, palpitation, murmurs, lower extremity swelling Respiratory/lungs= negative for shortness of breath, cough, hemoptysis, wheezing, mucus production Gastrointestinal= negative for nausea, vomiting,, abdominal pain, melena, hematemesis Genitourinary= negative for Dysuria, Hematuria, Change in Urinary Frequency MSK = Negative for arthralgia, myalgias, Back Pain, Joint swelling  Neurology= Negative for headache, seizures, numbness, tingling  Psychiatry= Negative for anxiety, depression, suicidal and homocidal ideation Allergy/Immunology= Medication/Food allergy as listed  Skin= Negative for Rash, lesions, ulcers, itching   Objective: Vitals:   01/05/18 1751 01/05/18 2118 01/06/18 0352 01/06/18 0556  BP: (!) 159/91 133/86 (!) 181/97 (!) 155/99  Pulse: 91 89 83   Resp:  18 18   Temp:  98.8 F (37.1 C) 98.7 F (37.1 C)   TempSrc:  Oral Oral   SpO2:  95% 95%   Weight:   82.1 kg   Height:        Intake/Output Summary (Last 24 hours) at 01/06/2018 1006 Last data filed at 01/06/2018 0300 Gross per 24 hour  Intake 2595.81 ml  Output 1000 ml  Net 1595.81 ml   Filed Weights   01/04/18 0619 01/05/18 0624 01/06/18 0352  Weight: 87.5 kg 85.3 kg 82.1 kg    Examination:  Constitutional: NAD, calm, comfortable, elderly frail-appearing Eyes: PERRL, lids and conjunctivae normal ENMT: Mucous membranes are moist. Posterior pharynx clear of any exudate or lesions.Normal dentition.  Neck: normal, supple, no masses, no thyromegaly Respiratory: clear to auscultation bilaterally, no wheezing,  no crackles. Normal respiratory effort. No accessory muscle use.    Cardiovascular: Regular rate and rhythm, no murmurs / rubs / gallops. No extremity edema. 2+ pedal pulses. No carotid bruits.  Abdomen: no tenderness, no masses palpated. No hepatosplenomegaly. Bowel sounds positive.  Musculoskeletal: no clubbing / cyanosis. No joint deformity upper and lower extremities. Good ROM, no contractures. Normal muscle tone.  Skin: no rashes, lesions, ulcers. No induration Neurologic: CN 2-12 grossly intact. Sensation intact, DTR normal. Strength 4/5 in all 4.  Psychiatric: Normal judgment and insight. Alert and oriented x 3. Normal mood.     Data Reviewed:   CBC: Recent Labs  Lab 12/31/17 1253 01/01/18 0457 01/02/18 0451 01/03/18 1007  WBC 17.4* 15.4* 14.3* 14.0*  NEUTROABS 12.9*  --   --   --   HGB 14.6 12.5* 11.1* 11.6*  HCT 46.8 39.9 36.4* 38.1*  MCV 79.5* 79.0* 79.3* 81.4  PLT 344 285 287 262   Basic Metabolic Panel: Recent Labs  Lab 01/01/18 1016 01/02/18 0451 01/03/18 0449 01/04/18 0506 01/05/18 0408 01/06/18 0713  NA  --  140 142 141 138 138  K  --  3.4* 3.1* 3.4* 3.0* 3.5  CL  --  114* 116* 118* 112* 111  CO2  --  19* 17* 16* 17* 17*  GLUCOSE  --  142* 92 83 68* 108*  BUN  --  20 20 20 14 13   CREATININE  --  1.57* 1.29* 1.28* 1.12 1.14  CALCIUM  --  7.8* 7.9* 7.5* 7.4* 7.6*  MG 1.9  --   --   --   --  1.9   GFR: Estimated Creatinine Clearance: 56.9 mL/min (by C-G formula based on SCr of 1.14 mg/dL). Liver Function Tests: Recent Labs  Lab 12/31/17 1253  AST 53*  ALT 65*  ALKPHOS 98  BILITOT 1.3*  PROT 6.7  ALBUMIN 2.7*   No results for input(s): LIPASE, AMYLASE in the last 168 hours. No results for input(s): AMMONIA in the last 168 hours. Coagulation Profile: Recent Labs  Lab 01/02/18 0451 01/03/18 0449 01/04/18 0506 01/05/18 0408 01/06/18 0713  INR 2.77 2.41 2.33 2.11 2.18   Cardiac Enzymes: Recent Labs  Lab 12/31/17 1253  CKTOTAL 751*   BNP (last 3 results) No results for input(s): PROBNP in the last  8760 hours. HbA1C: No results for input(s): HGBA1C in the last 72 hours. CBG: Recent Labs  Lab 01/05/18 0904 01/05/18 1140 01/05/18 1651 01/05/18 2310 01/06/18 0826  GLUCAP 102* 96 117* 76 89   Lipid Profile: No results for input(s): CHOL, HDL, LDLCALC, TRIG, CHOLHDL, LDLDIRECT in the last 72 hours. Thyroid Function Tests: No results for input(s): TSH, T4TOTAL, FREET4, T3FREE, THYROIDAB in the last 72 hours. Anemia Panel: No results for input(s): VITAMINB12, FOLATE, FERRITIN, TIBC, IRON, RETICCTPCT in the last 72 hours. Sepsis Labs: Recent Labs  Lab 12/31/17 1307 01/01/18 1016  LATICACIDVEN 2.33* 1.6    Recent Results (from the past 240 hour(s))  Urine culture     Status: None   Collection Time: 12/31/17 12:53 PM  Result Value Ref Range Status   Specimen Description URINE, RANDOM  Final   Special Requests unknown Normal  Final   Culture   Final    NO GROWTH Performed at [facility], [place] [address] [place], [place] [address]    Report Status 01/01/2018 FINAL  Final  MRSA PCR Screening     Status: Abnormal   Collection Time: 12/31/17  5:38 PM  Result  Value Ref Range Status   MRSA by PCR POSITIVE (A) NEGATIVE Final    Comment:        The GeneXpert MRSA Assay (FDA approved for NASAL specimens only), is one component of a comprehensive MRSA colonization surveillance program. It is not intended to diagnose MRSA infection nor to guide or monitor treatment for MRSA infections. CRITICAL RESULT CALLED TO, READ BACK BY AND VERIFIED WITHMarylen Ponto RN [phone] 2111 BY GF Performed at [facility], [place] [address], [place], [place] [address]          Radiology Studies: No results found.      Scheduled Meds: . carvedilol  6.25 mg Oral BID WC  . collagenase   Topical Daily  . ferrous sulfate  325 mg Oral BID WC  . folic acid  0.5 mg Oral Daily  . hydrocortisone sod succinate (SOLU-CORTEF) inj  50 mg Intravenous Q12H  . insulin aspart  0-5 Units  Subcutaneous QHS  . insulin aspart  0-9 Units Subcutaneous TID WC  . insulin glargine  26 Units Subcutaneous QHS  . megestrol  20 mg Oral Daily  . methocarbamol  500 mg Oral TID  . senna-docusate  2 tablet Oral BID  . thyroid  60 mg Oral QAC breakfast  . traZODone  100 mg Oral QHS  . vitamin B-12  1,000 mcg Oral Daily  . Warfarin - Pharmacist Dosing Inpatient   Does not apply q1800   Continuous Infusions: . sodium chloride 75 mL/hr at 01/06/18 0300  . sodium chloride 250 mL (01/06/18 0552)  . piperacillin-tazobactam (ZOSYN)  IV 3.375 g (01/06/18 0553)  . vancomycin Stopped (01/05/18 1957)     LOS: 6 days   Time spent= 15 mins    Brieanna Nau Arsenio Loader, MD [facility] Pager [phone]   If 7PM-7AM, please contact night-coverage [email or web address] Password [email or web address] 01/06/2018, 10:06 AM

## 2018-01-06 NOTE — Progress Notes (Signed)
Unable to document on care plan due to reconciled education.

## 2018-01-06 NOTE — Progress Notes (Signed)
Dressing changed on all pressure ulcer area, BLLE, and right hip, turning and positioning continue throughout the day, oxycodone once provided for back pain, will continue to monitor  Palma Holter, RN

## 2018-01-07 DIAGNOSIS — F321 Major depressive disorder, single episode, moderate: Secondary | ICD-10-CM

## 2018-01-07 LAB — GLUCOSE, CAPILLARY
GLUCOSE-CAPILLARY: 106 mg/dL — AB (ref 70–99)
GLUCOSE-CAPILLARY: 95 mg/dL (ref 70–99)
Glucose-Capillary: 124 mg/dL — ABNORMAL HIGH (ref 70–99)
Glucose-Capillary: 87 mg/dL (ref 70–99)

## 2018-01-07 LAB — PROTIME-INR
INR: 2.16
Prothrombin Time: 23.8 seconds — ABNORMAL HIGH (ref 11.4–15.2)

## 2018-01-07 MED ORDER — WARFARIN SODIUM 3 MG PO TABS
3.0000 mg | ORAL_TABLET | Freq: Once | ORAL | Status: AC
  Administered 2018-01-07: 3 mg via ORAL
  Filled 2018-01-07: qty 1

## 2018-01-07 NOTE — Progress Notes (Signed)
PASRR is still under review for pt.  CSW will continue to follow.  Reed Breech LCSWA [phone]

## 2018-01-07 NOTE — Plan of Care (Signed)
  Problem: Activity: Goal: Risk for activity intolerance will decrease Outcome: Progressing   Problem: Nutrition: Goal: Adequate nutrition will be maintained Outcome: Progressing   

## 2018-01-07 NOTE — Progress Notes (Signed)
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  He was diagnosed with cellulitis.  Hospital course complicated by septic shock but improved with fluids and steroids.  While awaiting placement at skilled nursing facility he completed his IV course of antibiotic in the hospital.   Assessment & Plan:   Active Problems:   Hypothyroidism   CKD (chronic kidney disease) stage 3, GFR 30-59 ml/min (HCC)   Addison disease (HCC)   CAD S/P percutaneous coronary angioplasty   Anemia in CKD (chronic kidney disease)   Depression   Cellulitis   Pressure injury of skin  Sepsis secondary to right lower extremity cellulitis in the right side of his back - Patient completed IV vancomycin and Zosyn course in the hospital.  Will discontinue this and monitor him while awaiting placement.  Hypokalemia- replete as needed  History of Addison's disease - Resume home steroids  Insulin-dependent diabetes mellitus type 2; controlled -Resume home regimen  CKD stage III, stable  Hypothyroidism-continue Synthroid  Coronary artery disease-continue home meds, currently chest pain-free  History of pulmonary embolism-continue Coumadin, INR today is 5.27  Chronic diastolic congestive heart failure-appears well compensated.  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  Discharge patient    DVT prophylaxis: Coumadin Code Status: Full code Family Communication: None at bedside Disposition Plan: Patient can be discharged once he has placement.  Consultants:   None  Procedures:   None   Subjective: No complaints, no acute events overnight.  Remains  afebrile.  Review of Systems Otherwise negative except as per HPI, including: General = no fevers, chills, dizziness, malaise, fatigue HEENT/EYES = negative for pain, redness, loss of vision, double vision, blurred vision, loss of hearing, sore throat, hoarseness, dysphagia Cardiovascular= negative for chest pain, palpitation, murmurs, lower extremity swelling Respiratory/lungs= negative for shortness of breath, cough, hemoptysis, wheezing, mucus production Gastrointestinal= negative for nausea, vomiting,, abdominal pain, melena, hematemesis Genitourinary= negative for Dysuria, Hematuria, Change in Urinary Frequency MSK = Negative for arthralgia, myalgias, Back Pain, Joint swelling  Neurology= Negative for headache, seizures, numbness, tingling  Psychiatry= Negative for anxiety, depression, suicidal and homocidal ideation Allergy/Immunology= Medication/Food allergy as listed  Skin= Negative for Rash, lesions, ulcers, itching   Objective: Vitals:   01/06/18 0810 01/06/18 1958 01/07/18 0502 01/07/18 0504  BP: 140/90 (!) 180/92 (!) 187/101 (!) 157/91  Pulse: 86 90 86 84  Resp:  18 18   Temp:  98 F (36.7 C) 98.2 F (36.8 C)   TempSrc:   Oral   SpO2:  97% 94%   Weight:      Height:        Intake/Output Summary (Last 24 hours) at 01/07/2018 1202 Last data filed at 01/07/2018 0600 Gross per 24 hour  Intake 720 ml  Output 701 ml  Net 19 ml   Filed Weights   01/04/18 0619 01/05/18 0624 01/06/18 0352  Weight: 87.5 kg 85.3 kg 82.1 kg    Examination:  Constitutional: NAD, calm, comfortable Eyes: PERRL, lids and conjunctivae normal ENMT: Mucous membranes are moist. Posterior pharynx clear of any exudate or lesions.Normal dentition.  Neck: normal, supple, no masses, no thyromegaly Respiratory: clear to auscultation bilaterally, no wheezing, no crackles. Normal respiratory effort. No  accessory muscle use.  Cardiovascular: Regular rate and rhythm, no murmurs / rubs / gallops. No  extremity edema. 2+ pedal pulses. No carotid bruits.  Abdomen: no tenderness, no masses palpated. No hepatosplenomegaly. Bowel sounds positive.  Musculoskeletal: no clubbing / cyanosis. No joint deformity upper and lower extremities. Good ROM, no contractures. Normal muscle tone.  Skin: Lower extremity dressing noted without any evidence of bleeding Neurologic: CN 2-12 grossly intact. Sensation intact, DTR normal. Strength 5/5 in all 4.  Psychiatric: Normal judgment and insight. Alert and oriented x 3. Normal mood.    Data Reviewed:   CBC: Recent Labs  Lab 12/31/17 1253 01/01/18 0457 01/02/18 0451 01/03/18 1007  WBC 17.4* 15.4* 14.3* 14.0*  NEUTROABS 12.9*  --   --   --   HGB 14.6 12.5* 11.1* 11.6*  HCT 46.8 39.9 36.4* 38.1*  MCV 79.5* 79.0* 79.3* 81.4  PLT 344 285 287 676   Basic Metabolic Panel: Recent Labs  Lab 01/01/18 1016 01/02/18 0451 01/03/18 0449 01/04/18 0506 01/05/18 0408 01/06/18 0713  NA  --  140 142 141 138 138  K  --  3.4* 3.1* 3.4* 3.0* 3.5  CL  --  114* 116* 118* 112* 111  CO2  --  19* 17* 16* 17* 17*  GLUCOSE  --  142* 92 83 68* 108*  BUN  --  20 20 20 14 13   CREATININE  --  1.57* 1.29* 1.28* 1.12 1.14  CALCIUM  --  7.8* 7.9* 7.5* 7.4* 7.6*  MG 1.9  --   --   --   --  1.9   GFR: Estimated Creatinine Clearance: 56.9 mL/min (by C-G formula based on SCr of 1.14 mg/dL). Liver Function Tests: Recent Labs  Lab 12/31/17 1253  AST 53*  ALT 65*  ALKPHOS 98  BILITOT 1.3*  PROT 6.7  ALBUMIN 2.7*   No results for input(s): LIPASE, AMYLASE in the last 168 hours. No results for input(s): AMMONIA in the last 168 hours. Coagulation Profile: Recent Labs  Lab 01/03/18 0449 01/04/18 0506 01/05/18 0408 01/06/18 0713 01/07/18 0539  INR 2.41 2.33 2.11 2.18 2.16   Cardiac Enzymes: Recent Labs  Lab 12/31/17 1253  CKTOTAL 751*   BNP (last 3 results) No results for input(s): PROBNP in the last 8760 hours. HbA1C: No results for input(s): HGBA1C in  the last 72 hours. CBG: Recent Labs  Lab 01/06/18 0826 01/06/18 1305 01/06/18 1650 01/06/18 2104 01/07/18 0747  GLUCAP 89 142* 133* 114* 106*   Lipid Profile: No results for input(s): CHOL, HDL, LDLCALC, TRIG, CHOLHDL, LDLDIRECT in the last 72 hours. Thyroid Function Tests: No results for input(s): TSH, T4TOTAL, FREET4, T3FREE, THYROIDAB in the last 72 hours. Anemia Panel: No results for input(s): VITAMINB12, FOLATE, FERRITIN, TIBC, IRON, RETICCTPCT in the last 72 hours. Sepsis Labs: Recent Labs  Lab 12/31/17 1307 01/01/18 1016  LATICACIDVEN 2.33* 1.6    Recent Results (from the past 240 hour(s))  Urine culture     Status: None   Collection Time: 12/31/17 12:53 PM  Result Value Ref Range Status   Specimen Description URINE, RANDOM  Final   Special Requests unknown Normal  Final   Culture   Final    NO GROWTH Performed at [facility], [place] [address] [place], [place] [address]    Report Status 01/01/2018 FINAL  Final  MRSA PCR Screening     Status: Abnormal   Collection Time: 12/31/17  5:38 PM  Result Value Ref Range Status  MRSA by PCR POSITIVE (A) NEGATIVE Final    Comment:        The GeneXpert MRSA Assay (FDA approved for NASAL specimens only), is one component of a comprehensive MRSA colonization surveillance program. It is not intended to diagnose MRSA infection nor to guide or monitor treatment for MRSA infections. CRITICAL RESULT CALLED TO, READ BACK BY AND VERIFIED WITHMarylen Ponto RN [phone] 2111 BY GF Performed at [facility], [place] [address] [place], [place] [address]          Radiology Studies: No results found.      Scheduled Meds: . carvedilol  6.25 mg Oral BID WC  . collagenase   Topical Daily  . ferrous sulfate  325 mg Oral BID WC  . folic acid  0.5 mg Oral Daily  . hydrocortisone sod succinate (SOLU-CORTEF) inj  50 mg Intravenous Q12H  . insulin aspart  0-5 Units Subcutaneous QHS  . insulin aspart  0-9 Units  Subcutaneous TID WC  . insulin glargine  26 Units Subcutaneous QHS  . megestrol  20 mg Oral Daily  . methocarbamol  500 mg Oral TID  . senna-docusate  2 tablet Oral BID  . thyroid  60 mg Oral QAC breakfast  . traZODone  100 mg Oral QHS  . vitamin B-12  1,000 mcg Oral Daily  . warfarin  3 mg Oral ONCE-1800  . Warfarin - Pharmacist Dosing Inpatient   Does not apply q1800   Continuous Infusions: . sodium chloride 75 mL/hr at 01/07/18 0934  . sodium chloride 250 mL (01/06/18 0552)     LOS: 7 days   Time spent= 15 mins    Antoinne Spadaccini Arsenio Loader, MD [facility] Pager [phone]   If 7PM-7AM, please contact night-coverage [email or web address] Password [email or web address] 01/07/2018, 12:02 PM

## 2018-01-07 NOTE — Progress Notes (Signed)
ANTICOAGULATION CONSULT NOTE - Follow Up Consult  Pharmacy Consult for warfarin  Indication: history of pulmonary embolus (April 2016)   No Known Allergies  Patient Measurements: Height: 5\' 10"  (177.8 cm) Weight: 181 lb (82.1 kg) IBW/kg (Calculated) : 73  Vital Signs: Temp: 98.2 F (36.8 C) (12/14 0502) Temp Source: Oral (12/14 0502) BP: 157/91 (12/14 0504) Pulse Rate: 84 (12/14 0504)  Labs: Recent Labs    01/05/18 0408 01/06/18 0713 01/07/18 0539  LABPROT 23.3* 23.9* 23.8*  INR 2.11 2.18 2.16  CREATININE 1.12 1.14  --     Estimated Creatinine Clearance: 56.9 mL/min (by C-G formula based on SCr of 1.14 mg/dL).   Medications:  Medications Prior to Admission  Medication Sig Dispense Refill Last Dose  . acetaminophen (TYLENOL) 500 MG tablet Take 500 mg by mouth every 6 (six) hours as needed for headache (pain).   12/30/2017 at afternoon  . CALCIUM-MAGNESIUM-ZINC PO Take 1 tablet by mouth daily.    12/31/2017 at am  . carvedilol (COREG) 6.25 MG tablet Take 1 tablet (6.25 mg total) by mouth 2 (two) times daily with a meal. 180 tablet 3 12/31/2017 at 730  . Cholecalciferol (VITAMIN D-3) 1000 units CAPS Take 1,000 Units by mouth daily.   12/31/2017 at am  . Cobalamin Combinations (VITAMIN B12-FOLIC ACID PO) Take 1 tablet by mouth daily.   12/31/2017 at am  . furosemide (LASIX) 20 MG tablet Take 1 tablet (20 mg total) by mouth every Monday, Wednesday, and Friday. (Patient taking differently: Take 20 mg by mouth daily. ) 30 tablet 11 12/31/2017 at am  . gabapentin (NEURONTIN) 600 MG tablet Take 600 mg by mouth 3 (three) times daily.    12/31/2017 at am  . hydrocortisone (CORTEF) 20 MG tablet Take 10-20 mg by mouth See admin instructions. Take 1 tablet (20 mg) by mouth every morning, take 1/2 tablet (10 mg) at noon and take 1 tablet (20 mg) at night   12/31/2017 at am  . Insulin Glargine (LANTUS) 100 UNIT/ML Solostar Pen Inject 26 Units into the skin at bedtime. 3 mL 1 12/30/2017 at pm  .  megestrol (MEGACE) 40 MG tablet Take 20 mg by mouth daily. For appetite  1 12/31/2017 at am  . Multiple Vitamin (MULTIVITAMIN WITH MINERALS) TABS tablet Take 1 tablet by mouth 2 (two) times daily. Life Extension MultiVitamin   12/31/2017 at am  . nitroGLYCERIN (NITROSTAT) 0.4 MG SL tablet PLACE 1 TABLET UNDER TONGUE EVERY 5 MINUTES AS NEEDED FOR CHEST PAIN (UP TO 3 DOSES) (Patient taking differently: Place 0.4 mg under the tongue every 5 (five) minutes as needed for chest pain. Up to 3 doses) 75 tablet 1 never taken  . OXYGEN Inhale 2 L into the lungs continuous.   couple days ago at Unknown time  . potassium chloride (K-DUR,KLOR-CON) 10 MEQ tablet Take 10 mEq by mouth daily.    12/31/2017 at am  . promethazine (PHENERGAN) 25 MG tablet Take 25 mg by mouth every 6 (six) hours as needed for nausea or vomiting.    couple days ago  . thyroid (ARMOUR) 60 MG tablet Take 60 mg by mouth daily.    12/31/2017 at am  . traMADol (ULTRAM) 50 MG tablet Take 100 mg by mouth 3 (three) times daily.  0 12/31/2017 at am  . traZODone (DESYREL) 100 MG tablet Take 100 mg by mouth at bedtime.   2 12/30/2017 at pm  . VITAMIN E PO Take 1 capsule by mouth daily.  12/31/2017 at am  . warfarin (COUMADIN) 3 MG tablet TAKE AS DIRECTED BY COUMADIN CLINIC (Patient taking differently: Take 1.5-3 mg by mouth See admin instructions. Take 1/2 tablet (1.5 mg) by mouth on Sunday night, take 1 tablet (3 mg) on all other nights of the week - or as directed by Coumadin Clinic.) 30 tablet 0 12/30/2017 at 1900  . lidocaine (LIDODERM) 5 % Place 1 patch onto the skin daily. Remove & Discard patch within 12 hours or as directed by MD (Patient not taking: Reported on 12/31/2017) 30 patch 0 Not Taking at Unknown time  . megestrol (MEGACE) 400 MG/10ML suspension Take 10 mLs (400 mg total) by mouth daily. (Patient not taking: Reported on 12/31/2017) 480 mL 2 Not Taking at Unknown time  . methocarbamol (ROBAXIN) 500 MG tablet Take 1 tablet (500 mg total) by  mouth 3 (three) times daily. (Patient not taking: Reported on 12/31/2017) 90 tablet 1 Not Taking at Unknown time  . ondansetron (ZOFRAN) 4 MG tablet Take 1 tablet (4 mg total) by mouth every 6 (six) hours as needed for nausea. (Patient not taking: Reported on 12/31/2017) 20 tablet 0 Not Taking at Unknown time  . oxyCODONE (OXY IR/ROXICODONE) 5 MG immediate release tablet Take 1 tablet (5 mg total) by mouth every 4 (four) hours as needed for moderate pain. (Patient not taking: Reported on 12/31/2017) 15 tablet 0 Not Taking at Unknown time  . senna-docusate (SENOKOT-S) 8.6-50 MG tablet Take 2 tablets by mouth 2 (two) times daily. (Patient not taking: Reported on 12/31/2017) 120 tablet 1 Not Taking at Unknown time    Assessment: 76 yr old male continues on Coumadin as PTA for hx PE.  INR supratherapeutic (4.34) on admit 12/7.  Coumadin held x 2 days then resumed on 12/9 when INR 2.77.    Patient not given warfarin on 12/13 due to miscommunications about when patient discharging. INR therapeutic at 2.16.   Home regimen:  3 mg daily except 1.5 mg on Sundays.  Goal of Therapy:  INR 2-3 Monitor platelets by anticoagulation protocol: Yes   Plan:  Warfarin 3mg today, home dose, if not discharged Daily PT/INR Monitor for s/sx of bleeding and interacting medications   Anna Love, Pharm D PGY1 Pharmacy Resident  Phone [phone] Please use AMION for clinical pharmacists numbers  01/07/2018      10 :09 AM

## 2018-01-08 LAB — PROTIME-INR
INR: 1.82
Prothrombin Time: 20.9 s — ABNORMAL HIGH (ref 11.4–15.2)

## 2018-01-08 LAB — GLUCOSE, CAPILLARY: Glucose-Capillary: 77 mg/dL (ref 70–99)

## 2018-01-08 MED ORDER — WARFARIN SODIUM 3 MG PO TABS
3.0000 mg | ORAL_TABLET | Freq: Once | ORAL | Status: AC
  Administered 2018-01-08: 3 mg via ORAL
  Filled 2018-01-08: qty 1

## 2018-01-08 NOTE — Progress Notes (Signed)
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  He was diagnosed with cellulitis.  Hospital course complicated by septic shock but improved with fluids and steroids.  While awaiting placement at skilled nursing facility he completed his IV course of antibiotic in the hospital.   Assessment & Plan:   Active Problems:   Hypothyroidism   CKD (chronic kidney disease) stage 3, GFR 30-59 ml/min (HCC)   Addison disease (HCC)   CAD S/P percutaneous coronary angioplasty   Anemia in CKD (chronic kidney disease)   Depression   Cellulitis   Pressure injury of skin  Sepsis secondary to right lower extremity cellulitis in the right side of his back; doing better.  Mild dehydration.  - Patient completed IV vancomycin and Zosyn course in the hospital.  Will discontinue this and monitor him while awaiting placement.Due to some signs of dehydration, will give him IVF while awaiting bed. I believe his po intake is poor.   Hypokalemia- replete prn  History of Addison's disease - Resume home steroids  Insulin-dependent diabetes mellitus type 2; controlled -Resume home regimen  CKD stage III, stable  Hypothyroidism-continue Synthroid  Coronary artery disease-continue home meds, currently chest pain-free  History of pulmonary embolism-continue Coumadin, INR today is 1.82. Pharmacy to manage.  Chronic diastolic congestive heart failure-appears well compensated.  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  Discharge patient.   DVT prophylaxis: Coumadin Code Status: Full code Family Communication: None at bedside Disposition Plan: Awaiting placement.     Consultants:   None  Procedures:   None   Subjective: No complaints, remains afebrile.   Review of Systems Otherwise negative except as per HPI, including: General = no fevers, chills, dizziness, malaise, fatigue HEENT/EYES = negative for pain, redness, loss of vision, double vision, blurred vision, loss of hearing, sore throat, hoarseness, dysphagia Cardiovascular= negative for chest pain, palpitation, murmurs, lower extremity swelling Respiratory/lungs= negative for shortness of breath, cough, hemoptysis, wheezing, mucus production Gastrointestinal= negative for nausea, vomiting,, abdominal pain, melena, hematemesis Genitourinary= negative for Dysuria, Hematuria, Change in Urinary Frequency MSK = Negative for arthralgia, myalgias, Back Pain, Joint swelling  Neurology= Negative for headache, seizures, numbness, tingling  Psychiatry= Negative for anxiety, depression, suicidal and homocidal ideation Allergy/Immunology= Medication/Food allergy as listed  Skin= Negative for Rash, lesions, ulcers, itching    Objective: Vitals:   01/07/18 1250 01/07/18 1927 01/08/18 0535 01/08/18 0800  BP: (!) 169/90 (!) 175/96 (!) 180/98 (!) 160/80  Pulse: 80 84 82   Resp: 20 18 18    Temp: 99.2 F (37.3 C) 98 F (36.7 C) 98 F (36.7 C)   TempSrc: Oral  Oral   SpO2: 96% 99% 98%   Weight:   79.5 kg   Height:        Intake/Output Summary (Last 24 hours) at 01/08/2018 1056 Last data filed at 01/08/2018 1006 Gross per 24 hour  Intake 360 ml  Output 1825 ml  Net -1465 ml   Filed Weights   01/06/18 0352 01/07/18 0504 01/08/18 0535  Weight: 82.1 kg 80.2 kg 79.5 kg    Examination:  Constitutional: NAD, calm, comfortable Eyes: PERRL, lids and conjunctivae normal ENMT: Mucous membranes are DRY Posterior pharynx clear of any  exudate or lesions.Normal dentition.  Neck: normal, supple, no masses, no thyromegaly Respiratory: clear to auscultation bilaterally, no wheezing, no crackles.  Normal respiratory effort. No accessory muscle use.  Cardiovascular: Regular rate and rhythm, no murmurs / rubs / gallops. No extremity edema. 2+ pedal pulses. No carotid bruits.  Abdomen: no tenderness, no masses palpated. No hepatosplenomegaly. Bowel sounds positive.  Musculoskeletal: no clubbing / cyanosis. No joint deformity upper and lower extremities. Good ROM, no contractures. Normal muscle tone.  Skin: LE dressing noted without any signs of obvious bleeding.  Neurologic: CN 2-12 grossly intact. Sensation intact, DTR normal. Strength 5/5 in all 4.  Psychiatric: Normal judgment and insight. Alert and oriented x 3. Normal mood.    Data Reviewed:   CBC: Recent Labs  Lab 01/02/18 0451 01/03/18 1007  WBC 14.3* 14.0*  HGB 11.1* 11.6*  HCT 36.4* 38.1*  MCV 79.3* 81.4  PLT 287 937   Basic Metabolic Panel: Recent Labs  Lab 01/02/18 0451 01/03/18 0449 01/04/18 0506 01/05/18 0408 01/06/18 0713  NA 140 142 141 138 138  K 3.4* 3.1* 3.4* 3.0* 3.5  CL 114* 116* 118* 112* 111  CO2 19* 17* 16* 17* 17*  GLUCOSE 142* 92 83 68* 108*  BUN 20 20 20 14 13   CREATININE 1.57* 1.29* 1.28* 1.12 1.14  CALCIUM 7.8* 7.9* 7.5* 7.4* 7.6*  MG  --   --   --   --  1.9   GFR: Estimated Creatinine Clearance: 56.9 mL/min (by C-G formula based on SCr of 1.14 mg/dL). Liver Function Tests: No results for input(s): AST, ALT, ALKPHOS, BILITOT, PROT, ALBUMIN in the last 168 hours. No results for input(s): LIPASE, AMYLASE in the last 168 hours. No results for input(s): AMMONIA in the last 168 hours. Coagulation Profile: Recent Labs  Lab 01/04/18 0506 01/05/18 0408 01/06/18 0713 01/07/18 0539 01/08/18 0352  INR 2.33 2.11 2.18 2.16 1.82   Cardiac Enzymes: No results for input(s): CKTOTAL, CKMB, CKMBINDEX, TROPONINI in the last 168 hours. BNP (last 3 results) No results for input(s): PROBNP in the last 8760 hours. HbA1C: No results for input(s): HGBA1C in the last 72 hours. CBG: Recent Labs    Lab 01/07/18 0747 01/07/18 1304 01/07/18 1627 01/07/18 2053 01/08/18 0814  GLUCAP 106* 95 124* 87 77   Lipid Profile: No results for input(s): CHOL, HDL, LDLCALC, TRIG, CHOLHDL, LDLDIRECT in the last 72 hours. Thyroid Function Tests: No results for input(s): TSH, T4TOTAL, FREET4, T3FREE, THYROIDAB in the last 72 hours. Anemia Panel: No results for input(s): VITAMINB12, FOLATE, FERRITIN, TIBC, IRON, RETICCTPCT in the last 72 hours. Sepsis Labs: No results for input(s): PROCALCITON, LATICACIDVEN in the last 168 hours.  Recent Results (from the past 240 hour(s))  Urine culture     Status: None   Collection Time: 12/31/17 12:53 PM  Result Value Ref Range Status   Specimen Description URINE, RANDOM  Final   Special Requests unknown Normal  Final   Culture   Final    NO GROWTH Performed at [facility], [address] [place], [place] [address]    Report Status 01/01/2018 FINAL  Final  MRSA PCR Screening     Status: Abnormal   Collection Time: 12/31/17  5:38 PM  Result Value Ref Range Status   MRSA by PCR POSITIVE (A) NEGATIVE Final    Comment:        The GeneXpert MRSA Assay (FDA approved for NASAL specimens only), is one component of a comprehensive MRSA colonization surveillance  program. It is not intended to diagnose MRSA infection nor to guide or monitor treatment for MRSA infections. CRITICAL RESULT CALLED TO, READ BACK BY AND VERIFIED WITHMarylen Ponto RN [phone] 2111 BY GF Performed at [facility], [place] [address] [place], [place] [address]          Radiology Studies: No results found.      Scheduled Meds: . carvedilol  6.25 mg Oral BID WC  . collagenase   Topical Daily  . ferrous sulfate  325 mg Oral BID WC  . folic acid  0.5 mg Oral Daily  . hydrocortisone sod succinate (SOLU-CORTEF) inj  50 mg Intravenous Q12H  . insulin aspart  0-5 Units Subcutaneous QHS  . insulin aspart  0-9 Units Subcutaneous TID WC  . insulin glargine  26 Units  Subcutaneous QHS  . megestrol  20 mg Oral Daily  . methocarbamol  500 mg Oral TID  . senna-docusate  2 tablet Oral BID  . thyroid  60 mg Oral QAC breakfast  . traZODone  100 mg Oral QHS  . vitamin B-12  1,000 mcg Oral Daily  . warfarin  3 mg Oral ONCE-1800  . Warfarin - Pharmacist Dosing Inpatient   Does not apply q1800   Continuous Infusions: . sodium chloride 250 mL (01/06/18 0552)     LOS: 8 days   Time spent= 15 mins    Leevon Upperman Arsenio Loader, MD [facility] Pager [phone]   If 7PM-7AM, please contact night-coverage [email or web address] Password [email or web address] 01/08/2018, 10:56 AM

## 2018-01-08 NOTE — Progress Notes (Signed)
For some reason's pt's CBG are not transferring to Epic, 12pm-94 4pm-102  Palma Holter, Therapist, sports

## 2018-01-08 NOTE — Progress Notes (Signed)
Pt is turning and positioning frequently, dressing changed done on all his wounds, pt get up once to [facility] for BM, pt is resting comfortably in bed now, denies pain and distress  Palma Holter, RN

## 2018-01-08 NOTE — Clinical Social Work Note (Signed)
Patient's Level 2 [facility] number is still pending, patient can not discharge to SNF until [facility] number has been received.  CSW continuing to follow patient's progress throughout discharge planning.  Jones Broom. [place], MSW, [place]  01/08/2018 11:20 AM

## 2018-01-08 NOTE — Progress Notes (Signed)
Pt CBG 86 

## 2018-01-08 NOTE — Progress Notes (Signed)
ANTICOAGULATION CONSULT NOTE - Follow Up Consult  Pharmacy Consult for warfarin  Indication: history of pulmonary embolus (April 2016)   No Known Allergies  Patient Measurements: Height: 5\' 10"  (177.8 cm) Weight: 175 lb 3.2 oz (79.5 kg) IBW/kg (Calculated) : 73  Vital Signs: Temp: 98 F (36.7 C) (12/15 0535) Temp Source: Oral (12/15 0535) BP: 160/80 (12/15 0800) Pulse Rate: 82 (12/15 0535)  Labs: Recent Labs    01/06/18 0713 01/07/18 0539 01/08/18 0352  LABPROT 23.9* 23.8* 20.9*  INR 2.18 2.16 1.82  CREATININE 1.14  --   --     Estimated Creatinine Clearance: 56.9 mL/min (by C-G formula based on SCr of 1.14 mg/dL).   Medications:  Medications Prior to Admission  Medication Sig Dispense Refill Last Dose  . acetaminophen (TYLENOL) 500 MG tablet Take 500 mg by mouth every 6 (six) hours as needed for headache (pain).   12/30/2017 at afternoon  . CALCIUM-MAGNESIUM-ZINC PO Take 1 tablet by mouth daily.    12/31/2017 at am  . carvedilol (COREG) 6.25 MG tablet Take 1 tablet (6.25 mg total) by mouth 2 (two) times daily with a meal. 180 tablet 3 12/31/2017 at 730  . Cholecalciferol (VITAMIN D-3) 1000 units CAPS Take 1,000 Units by mouth daily.   12/31/2017 at am  . Cobalamin Combinations (VITAMIN B12-FOLIC ACID PO) Take 1 tablet by mouth daily.   12/31/2017 at am  . furosemide (LASIX) 20 MG tablet Take 1 tablet (20 mg total) by mouth every Monday, Wednesday, and Friday. (Patient taking differently: Take 20 mg by mouth daily. ) 30 tablet 11 12/31/2017 at am  . gabapentin (NEURONTIN) 600 MG tablet Take 600 mg by mouth 3 (three) times daily.    12/31/2017 at am  . hydrocortisone (CORTEF) 20 MG tablet Take 10-20 mg by mouth See admin instructions. Take 1 tablet (20 mg) by mouth every morning, take 1/2 tablet (10 mg) at noon and take 1 tablet (20 mg) at night   12/31/2017 at am  . Insulin Glargine (LANTUS) 100 UNIT/ML Solostar Pen Inject 26 Units into the skin at bedtime. 3 mL 1 12/30/2017 at pm   . megestrol (MEGACE) 40 MG tablet Take 20 mg by mouth daily. For appetite  1 12/31/2017 at am  . Multiple Vitamin (MULTIVITAMIN WITH MINERALS) TABS tablet Take 1 tablet by mouth 2 (two) times daily. Life Extension MultiVitamin   12/31/2017 at am  . nitroGLYCERIN (NITROSTAT) 0.4 MG SL tablet PLACE 1 TABLET UNDER TONGUE EVERY 5 MINUTES AS NEEDED FOR CHEST PAIN (UP TO 3 DOSES) (Patient taking differently: Place 0.4 mg under the tongue every 5 (five) minutes as needed for chest pain. Up to 3 doses) 75 tablet 1 never taken  . OXYGEN Inhale 2 L into the lungs continuous.   couple days ago at Unknown time  . potassium chloride (K-DUR,KLOR-CON) 10 MEQ tablet Take 10 mEq by mouth daily.    12/31/2017 at am  . promethazine (PHENERGAN) 25 MG tablet Take 25 mg by mouth every 6 (six) hours as needed for nausea or vomiting.    couple days ago  . thyroid (ARMOUR) 60 MG tablet Take 60 mg by mouth daily.    12/31/2017 at am  . traMADol (ULTRAM) 50 MG tablet Take 100 mg by mouth 3 (three) times daily.  0 12/31/2017 at am  . traZODone (DESYREL) 100 MG tablet Take 100 mg by mouth at bedtime.   2 12/30/2017 at pm  . VITAMIN E PO Take 1 capsule by mouth  daily.    12/31/2017 at am  . warfarin (COUMADIN) 3 MG tablet TAKE AS DIRECTED BY COUMADIN CLINIC (Patient taking differently: Take 1.5-3 mg by mouth See admin instructions. Take 1/2 tablet (1.5 mg) by mouth on Sunday night, take 1 tablet (3 mg) on all other nights of the week - or as directed by Coumadin Clinic.) 30 tablet 0 12/30/2017 at 1900  . lidocaine (LIDODERM) 5 % Place 1 patch onto the skin daily. Remove & Discard patch within 12 hours or as directed by MD (Patient not taking: Reported on 12/31/2017) 30 patch 0 Not Taking at Unknown time  . megestrol (MEGACE) 400 MG/10ML suspension Take 10 mLs (400 mg total) by mouth daily. (Patient not taking: Reported on 12/31/2017) 480 mL 2 Not Taking at Unknown time  . methocarbamol (ROBAXIN) 500 MG tablet Take 1 tablet (500 mg total) by  mouth 3 (three) times daily. (Patient not taking: Reported on 12/31/2017) 90 tablet 1 Not Taking at Unknown time  . ondansetron (ZOFRAN) 4 MG tablet Take 1 tablet (4 mg total) by mouth every 6 (six) hours as needed for nausea. (Patient not taking: Reported on 12/31/2017) 20 tablet 0 Not Taking at Unknown time  . oxyCODONE (OXY IR/ROXICODONE) 5 MG immediate release tablet Take 1 tablet (5 mg total) by mouth every 4 (four) hours as needed for moderate pain. (Patient not taking: Reported on 12/31/2017) 15 tablet 0 Not Taking at Unknown time  . senna-docusate (SENOKOT-S) 8.6-50 MG tablet Take 2 tablets by mouth 2 (two) times daily. (Patient not taking: Reported on 12/31/2017) 120 tablet 1 Not Taking at Unknown time    Assessment: 77 yr old male continues on Coumadin as PTA for hx PE.  INR supratherapeutic (4.34) on admit 12/7.  Coumadin held x 2 days then resumed on 12/9 when INR 2.77.    Patient not given warfarin on 12/13 due to miscommunications about when patient discharging. INR subtherapeutic at 1.8. No signs/symptoms of bleeding and patient reports no complaints.   Home regimen: 3 mg daily except 1.5 mg on Sundays.  Goal of Therapy:  INR 2-3 Monitor platelets by anticoagulation protocol: Yes   Plan:  Warfarin 3mg  today, increased from home dose due to omission 12/13 Daily PT/INR Monitor for s/sx of bleeding and interacting medications   Isaias Sakai, Pharm D PGY1 Pharmacy Resident   Phone [phone] Please use AMION for clinical pharmacists numbers  01/08/2018      9:04 AM

## 2018-01-09 LAB — GLUCOSE, CAPILLARY
GLUCOSE-CAPILLARY: 124 mg/dL — AB (ref 70–99)
Glucose-Capillary: 102 mg/dL — ABNORMAL HIGH (ref 70–99)
Glucose-Capillary: 108 mg/dL — ABNORMAL HIGH (ref 70–99)
Glucose-Capillary: 112 mg/dL — ABNORMAL HIGH (ref 70–99)
Glucose-Capillary: 74 mg/dL (ref 70–99)
Glucose-Capillary: 80 mg/dL (ref 70–99)
Glucose-Capillary: 86 mg/dL (ref 70–99)
Glucose-Capillary: 94 mg/dL (ref 70–99)

## 2018-01-09 LAB — BASIC METABOLIC PANEL
Anion gap: 10 (ref 5–15)
BUN: 14 mg/dL (ref 8–23)
CO2: 21 mmol/L — ABNORMAL LOW (ref 22–32)
Calcium: 8.2 mg/dL — ABNORMAL LOW (ref 8.9–10.3)
Chloride: 109 mmol/L (ref 98–111)
Creatinine, Ser: 1.34 mg/dL — ABNORMAL HIGH (ref 0.61–1.24)
GFR calc non Af Amer: 51 mL/min — ABNORMAL LOW (ref >60)
GFR, EST AFRICAN AMERICAN: 59 mL/min — AB (ref >60)
Glucose, Bld: 119 mg/dL — ABNORMAL HIGH (ref 70–99)
Potassium: 3.8 mmol/L (ref 3.5–5.1)
Sodium: 140 mmol/L (ref 135–145)

## 2018-01-09 LAB — PROTIME-INR
INR: 1.73
Prothrombin Time: 20.1 seconds — ABNORMAL HIGH (ref 11.4–15.2)

## 2018-01-09 LAB — MAGNESIUM: Magnesium: 2.2 mg/dL (ref 1.7–2.4)

## 2018-01-09 MED ORDER — HYDROCORTISONE 20 MG PO TABS
20.0000 mg | ORAL_TABLET | Freq: Three times a day (TID) | ORAL | Status: DC
  Administered 2018-01-09 – 2018-01-10 (×4): 20 mg via ORAL
  Filled 2018-01-09 (×6): qty 1

## 2018-01-09 MED ORDER — HYDRALAZINE HCL 20 MG/ML IJ SOLN: 10.0000 mg | Freq: Four times a day (QID) | INTRAMUSCULAR | Status: DC | PRN

## 2018-01-09 MED ORDER — WARFARIN SODIUM 2 MG PO TABS
4.0000 mg | ORAL_TABLET | Freq: Once | ORAL | Status: AC
  Administered 2018-01-09: 4 mg via ORAL
  Filled 2018-01-09: qty 2

## 2018-01-09 NOTE — Progress Notes (Signed)
Pt brother would like to have update on brother, paged Dr Reesa Chew

## 2018-01-09 NOTE — Progress Notes (Signed)
Physical Therapy Treatment Patient Details Name: [name] MRN: [id number] DOB: [birth year] Today's Date: 01/09/2018    History of Present Illness Pt is a 77 y.o. male with PMH of DM, HF, CAD, Addison's disease (on chronic p.o. steroids), admitted 12/31/17 with worsening weakness and infected ulcers on BLEs and back. Worked up for cellulitis. Hospital course complicated by septic shock.   PT Comments    Pt slowly progressing with mobility. Improved ability to perform bed mobility with minA to roll. Pt with significant posterior lean upon sitting, requiring max, multimodal cues to assist anterior weight translation to prevent slide off EOB. Able to perform lateral scoot transfer from bed to recliner with maxA+2 (+3 safety) secondary to posterior lean and BLE extension. Pt very polite, agreeable to participate. Continue to recommend SNF-level therapies.   Follow Up Recommendations  SNF;Supervision/Assistance - 24 hour     Equipment Recommendations  Wheelchair (measurements PT);Wheelchair cushion (measurements PT);Hospital bed;Other (comment)(hoyer lift)    Recommendations for Other Services       Precautions / Restrictions Precautions Precautions: Fall;Other (comment) Precaution Comments: R hip and L foot ulcers; heavy posterior lean Restrictions Weight Bearing Restrictions: No    Mobility  Bed Mobility Overal bed mobility: Needs Assistance Bed Mobility: Rolling;Sidelying to Sit Rolling: Min assist Sidelying to sit: Mod assist;Max assist;+2 for physical assistance       General bed mobility comments: Pt with improved rolling for LE dressing change, although limited by continued c/o back pain. Mod-maxA+2 for trunk elevation from sidelying to sit, maxA to scoot hips to EOB. Pt with significant posterior lean requiring maxA+2 to prevent sliding from EOB and repeated, multimodal cues for anterior weight shifts  Transfers Overall transfer level: Needs assistance    Transfers: Lateral/Scoot Transfers          Lateral/Scoot Transfers: Max assist;+2 physical assistance;+2 safety/equipment General transfer comment: MaxA+2 (+3 safety) for lateral scoot from bed to drop arm chair with use of bed pad; pt with significant posterior lean requiring maxA+2 to block bilateral knees, repeated multimodal cues for anterior weight translation  Ambulation/Gait             General Gait Details: NT   Stairs             Wheelchair Mobility    Modified Rankin (Stroke Patients Only)       Balance Overall balance assessment: Needs assistance Sitting-balance support: No upper extremity supported;Feet supported;Bilateral upper extremity supported Sitting balance-Leahy Scale: Poor Sitting balance - Comments: MaxA+2 to maintain seated balance at EOB secondary to posterior lean/extension which would result in pt sliding from EOB; anxiety/poor attention, requiring repeated multimodal cues for many attempts to translate weight anteriorly, which only lasted a few seconds before pt extending back again Postural control: Posterior lean                                  Cognition Arousal/Alertness: Awake/alert Behavior During Therapy: WFL for tasks assessed/performed;Anxious Overall Cognitive Status: No family/caregiver present to determine baseline cognitive functioning Area of Impairment: Orientation;Attention;Following commands;Safety/judgement;Awareness;Problem solving                 Orientation Level: Disoriented to;Time;Situation Current Attention Level: Sustained   Following Commands: Follows one step commands inconsistently Safety/Judgement: Decreased awareness of deficits;Decreased awareness of safety Awareness: Intellectual Problem Solving: Requires verbal cues General Comments: Anxious/fearful of falling resulting in significant posterior lean, difficulty being redirected and following cues for  safety. Very polite       Exercises      General Comments        Pertinent Vitals/Pain Pain Assessment: Faces Faces Pain Scale: Hurts even more Pain Location: Back, R hip, LEs Pain Descriptors / Indicators: Aching;Grimacing;Guarding Pain Intervention(s): Limited activity within patient's tolerance;Monitored during session;Repositioned    Home Living                      Prior Function            PT Goals (current goals can now be found in the care plan section) Acute Rehab PT Goals Patient Stated Goal: to decrease pain PT Goal Formulation: With patient Time For Goal Achievement: 01/15/18 Potential to Achieve Goals: Good Progress towards PT goals: Progressing toward goals    Frequency    Min 2X/week      PT Plan Current plan remains appropriate    Co-evaluation PT/OT/SLP Co-Evaluation/Treatment: Yes Reason for Co-Treatment: Complexity of the patient's impairments (multi-system involvement);Necessary to address cognition/behavior during functional activity;For patient/therapist safety;To address functional/ADL transfers PT goals addressed during session: Mobility/safety with mobility;Balance        AM-PAC PT "6 Clicks" Mobility   Outcome Measure  Help needed turning from your back to your side while in a flat bed without using bedrails?: A Lot Help needed moving from lying on your back to sitting on the side of a flat bed without using bedrails?: A Lot Help needed moving to and from a bed to a chair (including a wheelchair)?: A Lot Help needed standing up from a chair using your arms (e.g., wheelchair or bedside chair)?: A Lot Help needed to walk in hospital room?: Total Help needed climbing 3-5 steps with a railing? : Total 6 Click Score: 10    End of Session   Activity Tolerance: Patient tolerated treatment well Patient left: in chair;with call bell/phone within reach;with chair alarm set Nurse Communication: Mobility status;Need for lift equipment PT Visit Diagnosis:  Muscle weakness (generalized) (M62.81);History of falling (Z91.81)     Time: 5631-4970 PT Time Calculation (min) (ACUTE ONLY): 30 min  Charges:  $Therapeutic Activity: 8-22 mins                    Mabeline Caras, PT, DPT Acute Rehabilitation Services  Pager [phone] Office [phone]  [name] 01/09/2018, 2:03 PM

## 2018-01-09 NOTE — Clinical Social Work Note (Addendum)
PASARR obtained: [id number] F, expires 04/09/2018. [name] will need to get a new insurance authorization since the other one expired. They will start it this morning. Will likely need updated therapy notes.  Dayton Scrape, CSW [phone]  2:55 pm  Sent updated therapy notes to SNF. Called and updated patient's wife.  Dayton Scrape, [place] [phone]  3:11 pm Patient has insurance approval for SNF. Reference #: [id number]. Per MD, patient will discharge tomorrow. SNF and wife aware.  Dayton Scrape, [place]

## 2018-01-09 NOTE — Progress Notes (Signed)
PROGRESS NOTE    [name]  EXB:[id number] DOB: [birth year] DOA: 12/31/2017 PCP: Lawerance Cruel, MD   Brief Narrative:  77 year old with diabetes mellitus type 2, diastolic congestive heart failure, CAD, Addison's disease on steroids at home came with complaints of lower extremity pain swelling and erythema.  He was diagnosed with cellulitis.  Hospital course complicated by septic shock but improved with fluids and steroids.  While awaiting placement at skilled nursing facility he completed his IV course of antibiotic in the hospital.   Assessment & Plan:   Active Problems:   Hypothyroidism   CKD (chronic kidney disease) stage 3, GFR 30-59 ml/min (HCC)   Addison disease (HCC)   CAD S/P percutaneous coronary angioplasty   Anemia in CKD (chronic kidney disease)   Depression   Cellulitis   Pressure injury of skin  Sepsis secondary to right lower extremity cellulitis in the right side of his back; doing better.  Present on admission Mild dehydration.  Improved - Completed course of IV vancomycin and Zosyn.  Currently awaiting placement.  Hydration status is better now but off and on due to poor intake he is at risk of dehydration.  Routinely encourage oral intake.  Hypokalemia- replete as necessary  History of Addison's disease - Resume home steroids  Insulin-dependent diabetes mellitus type 2; controlled -Resume home regimen  CKD stage III, stable  Hypothyroidism-continue Synthroid  Coronary artery disease-continue home meds, currently chest pain-free  History of pulmonary embolism-continue Coumadin, INR today is 1.73 pharmacy to manage.  Chronic diastolic congestive heart failure-appears well compensated.  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  Patient can be discharged  DVT prophylaxis: Coumadin Code Status: Full code Family Communication: None at bedside Disposition Plan:  Awaiting placement  Consultants:   None  Procedures:   None   Subjective: Does not have much complaints.  He is laying in the bed.  I explained he needs rehabilitation as soon as possible from further worsening of deconditioning  Review of Systems Otherwise negative except as per HPI, including: General = no fevers, chills, dizziness, malaise, fatigue HEENT/EYES = negative for pain, redness, loss of vision, double vision, blurred vision, loss of hearing, sore throat, hoarseness, dysphagia Cardiovascular= negative for chest pain, palpitation, murmurs, lower extremity swelling Respiratory/lungs= negative for shortness of breath, cough, hemoptysis, wheezing, mucus production Gastrointestinal= negative for nausea, vomiting,, abdominal pain, melena, hematemesis Genitourinary= negative for Dysuria, Hematuria, Change in Urinary Frequency MSK = Negative for arthralgia, myalgias, Back Pain, Joint swelling  Neurology= Negative for headache, seizures, numbness, tingling  Psychiatry= Negative for anxiety, depression, suicidal and homocidal ideation Allergy/Immunology= Medication/Food allergy as listed  Skin= Negative for Rash, lesions, ulcers, itching  Objective: Vitals:   01/09/18 0145 01/09/18 0431 01/09/18 0945 01/09/18 1144  BP: (!) 169/88 (!) 153/98 127/72 (!) 157/94  Pulse: 81 89 84 81  Resp:  20  19  Temp:  97.6 F (36.4 C)    TempSrc:  Oral    SpO2: 94% 90% 98% 95%  Weight:  78.5 kg    Height:        Intake/Output Summary (Last 24 hours) at 01/09/2018 1345 Last data filed at 01/09/2018 1300 Gross per 24 hour  Intake 840 ml  Output 1800 ml  Net -960 ml   Filed Weights   01/07/18 0504 01/08/18 0535 01/09/18 0431  Weight: 80.2 kg 79.5 kg 78.5 kg    Examination:  Constitutional: NAD, calm, comfortable, chronically ill and  frail appearing Eyes: PERRL, lids and conjunctivae normal ENMT: Mucous membranes are moist. Posterior pharynx clear of any exudate or  lesions.Normal dentition.  Neck: normal, supple, no masses, no thyromegaly Respiratory: clear to auscultation bilaterally, no wheezing, no crackles. Normal respiratory effort. No accessory muscle use.  Cardiovascular: Regular rate and rhythm, no murmurs / rubs / gallops. No extremity edema. 2+ pedal pulses. No carotid bruits.  Abdomen: no tenderness, no masses palpated. No hepatosplenomegaly. Bowel sounds positive.  Musculoskeletal: no clubbing / cyanosis. No joint deformity upper and lower extremities. Good ROM, no contractures. Normal muscle tone.  Skin: Right lower extremity dressing noted without any signs of obvious drainage or bleeding Neurologic: CN 2-12 grossly intact. Sensation intact, DTR normal. Strength 4/5 in all 4.  Psychiatric: Normal judgment and insight. Alert and oriented x 3. Normal mood.   Data Reviewed:   CBC: Recent Labs  Lab 01/03/18 1007  WBC 14.0*  HGB 11.6*  HCT 38.1*  MCV 81.4  PLT 673   Basic Metabolic Panel: Recent Labs  Lab 01/03/18 0449 01/04/18 0506 01/05/18 0408 01/06/18 0713 01/09/18 0421  NA 142 141 138 138 140  K 3.1* 3.4* 3.0* 3.5 3.8  CL 116* 118* 112* 111 109  CO2 17* 16* 17* 17* 21*  GLUCOSE 92 83 68* 108* 119*  BUN 20 20 14 13 14   CREATININE 1.29* 1.28* 1.12 1.14 1.34*  CALCIUM 7.9* 7.5* 7.4* 7.6* 8.2*  MG  --   --   --  1.9 2.2   GFR: Estimated Creatinine Clearance: 48.4 mL/min (A) (by C-G formula based on SCr of 1.34 mg/dL (H)). Liver Function Tests: No results for input(s): AST, ALT, ALKPHOS, BILITOT, PROT, ALBUMIN in the last 168 hours. No results for input(s): LIPASE, AMYLASE in the last 168 hours. No results for input(s): AMMONIA in the last 168 hours. Coagulation Profile: Recent Labs  Lab 01/05/18 0408 01/06/18 0713 01/07/18 0539 01/08/18 0352 01/09/18 0421  INR 2.11 2.18 2.16 1.82 1.73   Cardiac Enzymes: No results for input(s): CKTOTAL, CKMB, CKMBINDEX, TROPONINI in the last 168 hours. BNP (last 3 results) No  results for input(s): PROBNP in the last 8760 hours. HbA1C: No results for input(s): HGBA1C in the last 72 hours. CBG: Recent Labs  Lab 01/08/18 1713 01/08/18 2145 01/09/18 0053 01/09/18 0725 01/09/18 1140  GLUCAP 102* 86 108* 80 74   Lipid Profile: No results for input(s): CHOL, HDL, LDLCALC, TRIG, CHOLHDL, LDLDIRECT in the last 72 hours. Thyroid Function Tests: No results for input(s): TSH, T4TOTAL, FREET4, T3FREE, THYROIDAB in the last 72 hours. Anemia Panel: No results for input(s): VITAMINB12, FOLATE, FERRITIN, TIBC, IRON, RETICCTPCT in the last 72 hours. Sepsis Labs: No results for input(s): PROCALCITON, LATICACIDVEN in the last 168 hours.  Recent Results (from the past 240 hour(s))  Urine culture     Status: None   Collection Time: 12/31/17 12:53 PM  Result Value Ref Range Status   Specimen Description URINE, RANDOM  Final   Special Requests unknown Normal  Final   Culture   Final    NO GROWTH Performed at [facility], [address], [place], [place] [address]    Report Status 01/01/2018 FINAL  Final  MRSA PCR Screening     Status: Abnormal   Collection Time: 12/31/17  5:38 PM  Result Value Ref Range Status   MRSA by PCR POSITIVE (A) NEGATIVE Final    Comment:        The GeneXpert MRSA Assay (FDA approved for  NASAL specimens only), is one component of a comprehensive MRSA colonization surveillance program. It is not intended to diagnose MRSA infection nor to guide or monitor treatment for MRSA infections. CRITICAL RESULT CALLED TO, READ BACK BY AND VERIFIED WITHMarylen Ponto RN [phone] BY GF Performed at [facility], [place] [address], [place], [place] [address]          Radiology Studies: No results found.      Scheduled Meds: . carvedilol  6.25 mg Oral BID WC  . collagenase   Topical Daily  . ferrous sulfate  325 mg Oral BID WC  . folic acid  0.5 mg Oral Daily  . hydrocortisone sod succinate (SOLU-CORTEF) inj  50 mg  Intravenous Q12H  . insulin aspart  0-5 Units Subcutaneous QHS  . insulin aspart  0-9 Units Subcutaneous TID WC  . insulin glargine  26 Units Subcutaneous QHS  . megestrol  20 mg Oral Daily  . methocarbamol  500 mg Oral TID  . senna-docusate  2 tablet Oral BID  . thyroid  60 mg Oral QAC breakfast  . traZODone  100 mg Oral QHS  . vitamin B-12  1,000 mcg Oral Daily  . warfarin  4 mg Oral ONCE-1800  . Warfarin - Pharmacist Dosing Inpatient   Does not apply q1800   Continuous Infusions: . sodium chloride 250 mL (01/06/18 0552)     LOS: 9 days   Time spent= 15 minutes    Dennis Pilz Arsenio Loader, MD [facility] Pager [phone]   If 7PM-7AM, please contact night-coverage [email or web address] Password [email or web address] 01/09/2018, 1:45 PM

## 2018-01-09 NOTE — Progress Notes (Signed)
Patient blood sugars running low all day. On call made aware of cbg and insulin coverage for tonight.

## 2018-01-09 NOTE — Progress Notes (Signed)
Occupational Therapy Treatment Patient Details Name: [name] MRN: [id number] DOB: November 23, 1940 Today's Date: 01/09/2018    History of present illness Pt is a 77 y.o. male with PMH of DM, HF, CAD, Addison's disease (on chronic p.o. steroids), admitted 12/31/17 with worsening weakness and infected ulcers on BLEs and back. Worked up for cellulitis. Hospital course complicated by septic shock.   OT comments  Pt with less back pain and improved ability to perform bed mobility. Fearful of falling with strong posterior lean with sitting at EOB. Transferred using bed pad and lateral scoot method to drop arm recliner. Pt needing max assist to wash hands and assist to set up tray/open containers to self feed. Pt remained up in chair with chair alarm and call button. Pt cooperative throughout session. Continues to be appropriate for SNF level rehab.  Follow Up Recommendations  SNF;Supervision/Assistance - 24 hour    Equipment Recommendations       Recommendations for Other Services      Precautions / Restrictions Precautions Precautions: Fall Precaution Comments: R hip and L foot ulcers; heavy posterior lean Restrictions Weight Bearing Restrictions: No       Mobility Bed Mobility Overal bed mobility: Needs Assistance Bed Mobility: Rolling;Sidelying to Sit Rolling: Min assist Sidelying to sit: Mod assist;Max assist;+2 for physical assistance       General bed mobility comments: Pt with improved rolling for LE dressing change, although limited by continued c/o back pain. Mod-maxA+2 for trunk elevation from sidelying to sit, maxA to scoot hips to EOB. Pt with significant posterior lean requiring maxA+2 to prevent sliding from EOB and repeated, multimodal cues for anterior weight shifts  Transfers Overall transfer level: Needs assistance   Transfers: Lateral/Scoot Transfers          Lateral/Scoot Transfers: Max assist;+2 physical assistance;+2 safety/equipment General  transfer comment: MaxA+2 (+3 safety) for lateral scoot from bed to drop arm chair with use of bed pad; pt with significant posterior lean requiring maxA+2 to block bilateral knees, repeated multimodal cues for anterior weight translation    Balance Overall balance assessment: Needs assistance Sitting-balance support: No upper extremity supported;Feet supported;Bilateral upper extremity supported Sitting balance-Leahy Scale: Poor Sitting balance - Comments: MaxA+2 to maintain seated balance at EOB secondary to posterior lean/extension which would result in pt sliding from EOB; anxiety/poor attention, requiring repeated multimodal cues for many attempts to translate weight anteriorly, which only lasted a few seconds before pt extending back again Postural control: Posterior lean                                 ADL either performed or assessed with clinical judgement   ADL Overall ADL's : Needs assistance/impaired Eating/Feeding: Set up;Sitting Eating/Feeding Details (indicate cue type and reason): assist to open containers Grooming: Wash/dry hands;Sitting;Maximal assistance               Lower Body Dressing: Total assistance;Bed level       Toileting- Clothing Manipulation and Hygiene: Bed level;Total assistance               Vision       Perception     Praxis      Cognition Arousal/Alertness: Awake/alert Behavior During Therapy: Anxious Overall Cognitive Status: No family/caregiver present to determine baseline cognitive functioning Area of Impairment: Orientation;Attention;Following commands;Safety/judgement;Awareness;Problem solving                 Orientation Level: Disoriented  to;Time;Situation Current Attention Level: Sustained Memory: Decreased short-term memory Following Commands: Follows one step commands inconsistently Safety/Judgement: Decreased awareness of deficits;Decreased awareness of safety Awareness: Intellectual Problem  Solving: Slow processing;Decreased initiation;Difficulty sequencing;Requires verbal cues;Requires tactile cues General Comments: Anxious/fearful of falling resulting in significant posterior lean, difficulty being redirected and following cues for safety. Very polite        Exercises     Shoulder Instructions       General Comments      Pertinent Vitals/ Pain       Pain Assessment: Faces Faces Pain Scale: Hurts even more Pain Location: Back, R hip, LEs Pain Descriptors / Indicators: Aching;Grimacing;Guarding;Burning Pain Intervention(s): Monitored during session  Home Living                                          Prior Functioning/Environment              Frequency  Min 2X/week        Progress Toward Goals  OT Goals(current goals can now be found in the care plan section)  Progress towards OT goals: Progressing toward goals  Acute Rehab OT Goals Patient Stated Goal: to decrease pain OT Goal Formulation: With patient Time For Goal Achievement: 01/17/18 Potential to Achieve Goals: Good  Plan Discharge plan remains appropriate    Co-evaluation    PT/OT/SLP Co-Evaluation/Treatment: Yes Reason for Co-Treatment: For patient/therapist safety;Necessary to address cognition/behavior during functional activity PT goals addressed during session: Mobility/safety with mobility;Balance OT goals addressed during session: Strengthening/ROM;ADL's and self-care      AM-PAC OT "6 Clicks" Daily Activity     Outcome Measure   Help from another person eating meals?: A Little Help from another person taking care of personal grooming?: A Lot Help from another person toileting, which includes using toliet, bedpan, or urinal?: Total Help from another person bathing (including washing, rinsing, drying)?: Total Help from another person to put on and taking off regular upper body clothing?: Total Help from another person to put on and taking off regular lower  body clothing?: Total 6 Click Score: 9    End of Session Equipment Utilized During Treatment: Gait belt  OT Visit Diagnosis: Muscle weakness (generalized) (M62.81);Other symptoms and signs involving cognitive function;Pain;Dizziness and giddiness (R42);History of falling (Z91.81)   Activity Tolerance Patient tolerated treatment well   Patient Left in chair;with call bell/phone within reach;with chair alarm set   Nurse Communication Need for lift equipment;Mobility status        Time: 0354-6568 OT Time Calculation (min): 33 min  Charges: OT General Charges $OT Visit: 1 Visit OT Treatments $Therapeutic Activity: 8-22 mins  Nestor Lewandowsky, OTR/L Acute Rehabilitation Services Pager: [phone] Office: [phone]   Malka So 01/09/2018, 2:26 PM

## 2018-01-09 NOTE — Progress Notes (Signed)
   01/09/18 0040  What Happened  Was fall witnessed? No  Was patient injured? No  Patient found on floor  Found by Staff-comment Yetta Flock)  Stated prior activity other (comment) (in bed)  Follow Up  MD notified yes  Time MD notified 16  Family notified Yes-comment  Time family notified 0123  Additional tests No  Adult Fall Risk Assessment  Risk Factor Category (scoring not indicated) Fall has occurred during this admission (document High fall risk)  Age 77  Fall History: Fall within 6 months prior to admission 0  Elimination; Bowel and/or Urine Incontinence 2  Elimination; Bowel and/or Urine Urgency/Frequency 2  Medications: includes PCA/Opiates, Anti-convulsants, Anti-hypertensives, Diuretics, Hypnotics, Laxatives, Sedatives, and Psychotropics 5  Patient Care Equipment 1  Mobility-Assistance 2  Mobility-Gait 2  Mobility-Sensory Deficit 0  Altered awareness of immediate physical environment 1  Impulsiveness 2  Lack of understanding of one's physical/cognitive limitations 4  Total Score 23  Patient Fall Risk Level High fall risk  Adult Fall Risk Interventions  Required Bundle Interventions *See Row Information* High fall risk - low, moderate, and high requirements implemented  Additional Interventions Use of appropriate toileting equipment (bedpan, BSC, etc.)  Screening for Fall Injury Risk (To be completed on HIGH fall risk patients) - Assessing Need for Low Bed  Risk For Fall Injury- Low Bed Criteria Previous fall this admission  Will Implement Low Bed and Floor Mats Low bed contraindicated, floor mats in place  Specialty Low Bed Contraindicated Requires therapeutic low air loss mattress  Screening for Fall Injury Risk (To be completed on HIGH fall risk patients who do not meet crieteria for Low Bed) - Assessing Need for Floor Mats Only  Risk For Fall Injury- Criteria for Floor Mats None identified - No additional interventions needed  Will Implement Floor Mats Yes  Pain  Assessment  Pain Scale 0-10  Pain Score 6  Pain Type Chronic pain  Pain Location Back  Pain Descriptors / Indicators Aching  Pain Onset On-going  Patients Stated Pain Goal 5  Pain Intervention(s) Refused;MD notified (Comment)  PCA/Epidural/Spinal Assessment  Respiratory Pattern Regular;Unlabored  Neurological  Neuro (WDL) X  Level of Consciousness Alert  Orientation Level Oriented to person;Oriented to place  Cognition Follows commands  Speech Clear  R Hand Grip Present;Moderate  L Hand Grip Present;Moderate   R Foot Dorsiflexion Present;Weak  L Foot Dorsiflexion Weak;Present  R Foot Plantar Flexion Present;Weak  L Foot Plantar Flexion Weak;Present  RUE Sensation Full sensation  LUE Sensation Full sensation  RLE Sensation Decreased  LLE Sensation Decreased  Neuro Symptoms Forgetful  Musculoskeletal  Musculoskeletal (WDL) X  Generalized Weakness Yes  Integumentary  Integumentary (WDL) X  Skin Color Appropriate for ethnicity  Skin Condition Dry  Skin Integrity Cellulitis  Abrasion Location Foot  Abrasion Location Orientation Anterior  Abrasion Intervention Gauze  Cellulitis Location Leg;Hip  Cellulitis Location Orientation Right  Cellulitis Intervention Foam  Cracking Location Back  Cracking Location Orientation Right;Posterior  Cracking Intervention Other (Comment)  Ecchymosis Location Arm;Hand  Ecchymosis Location Orientation Right  Ecchymosis Intervention Other (Comment)  Skin Turgor Non-tenting  Pain Assessment  Result of Injury No   Pt was found down beside the bed. Stated he sat down in the floor. Stated he did not hit his head. No obvious injuries. VS taken.  MD notified. No new orders received. Wife informed.

## 2018-01-09 NOTE — Progress Notes (Signed)
ANTICOAGULATION CONSULT NOTE - Follow Up Consult  Pharmacy Consult for Coumadin Indication: history of pulmonary embolus (April 2016)   No Known Allergies  Patient Measurements: Height: 5\' 10"  (177.8 cm) Weight: 173 lb (78.5 kg) IBW/kg (Calculated) : 73  Vital Signs: Temp: 97.6 F (36.4 C) (12/16 0431) Temp Source: Oral (12/16 0431) BP: 157/94 (12/16 1144) Pulse Rate: 81 (12/16 1144)  Labs: Recent Labs    01/07/18 0539 01/08/18 0352 01/09/18 0421  LABPROT 23.8* 20.9* 20.1*  INR 2.16 1.82 1.73  CREATININE  --   --  1.34*    Estimated Creatinine Clearance: 48.4 mL/min (A) (by C-G formula based on SCr of 1.34 mg/dL (H)).  Assessment: 77 yr old male continues on Coumadin as PTA for hx PE.  INR supratherapeutic (4.34) on admit 12/7.  Coumadin held x 2 days then resumed on 12/9 when INR 2.77.     INR subtherapeutic (1.73). Dose missed on 12/13 due to miscommunication about timing of discharge.   Home regimen: 3 mg daily except 1.5 mg on Sundays.  Goal of Therapy:  INR 2-3 Monitor platelets by anticoagulation protocol: Yes   Plan:   Increase Coumadin to 4 mg x 1 today.  Daily PT/INR.  [name], [place] Pager: [phone] or phone: [phone] 01/09/2018 12:04 PM

## 2018-01-10 DIAGNOSIS — Z7401 Bed confinement status: Secondary | ICD-10-CM | POA: Diagnosis not present

## 2018-01-10 DIAGNOSIS — Z7901 Long term (current) use of anticoagulants: Secondary | ICD-10-CM | POA: Diagnosis not present

## 2018-01-10 DIAGNOSIS — J449 Chronic obstructive pulmonary disease, unspecified: Secondary | ICD-10-CM | POA: Diagnosis not present

## 2018-01-10 DIAGNOSIS — M546 Pain in thoracic spine: Secondary | ICD-10-CM | POA: Diagnosis not present

## 2018-01-10 DIAGNOSIS — Z9861 Coronary angioplasty status: Secondary | ICD-10-CM | POA: Diagnosis not present

## 2018-01-10 DIAGNOSIS — R0902 Hypoxemia: Secondary | ICD-10-CM | POA: Diagnosis not present

## 2018-01-10 DIAGNOSIS — L03116 Cellulitis of left lower limb: Secondary | ICD-10-CM | POA: Diagnosis not present

## 2018-01-10 DIAGNOSIS — L8992 Pressure ulcer of unspecified site, stage 2: Secondary | ICD-10-CM | POA: Diagnosis not present

## 2018-01-10 DIAGNOSIS — D631 Anemia in chronic kidney disease: Secondary | ICD-10-CM | POA: Diagnosis not present

## 2018-01-10 DIAGNOSIS — L0390 Cellulitis, unspecified: Secondary | ICD-10-CM | POA: Diagnosis not present

## 2018-01-10 DIAGNOSIS — L03119 Cellulitis of unspecified part of limb: Secondary | ICD-10-CM | POA: Diagnosis not present

## 2018-01-10 DIAGNOSIS — M545 Low back pain: Secondary | ICD-10-CM | POA: Diagnosis not present

## 2018-01-10 DIAGNOSIS — E271 Primary adrenocortical insufficiency: Secondary | ICD-10-CM | POA: Diagnosis not present

## 2018-01-10 DIAGNOSIS — L89892 Pressure ulcer of other site, stage 2: Secondary | ICD-10-CM | POA: Diagnosis not present

## 2018-01-10 DIAGNOSIS — E1169 Type 2 diabetes mellitus with other specified complication: Secondary | ICD-10-CM | POA: Diagnosis not present

## 2018-01-10 DIAGNOSIS — L89210 Pressure ulcer of right hip, unstageable: Secondary | ICD-10-CM | POA: Diagnosis not present

## 2018-01-10 DIAGNOSIS — F321 Major depressive disorder, single episode, moderate: Secondary | ICD-10-CM | POA: Diagnosis not present

## 2018-01-10 DIAGNOSIS — R531 Weakness: Secondary | ICD-10-CM | POA: Diagnosis not present

## 2018-01-10 DIAGNOSIS — N183 Chronic kidney disease, stage 3 (moderate): Secondary | ICD-10-CM | POA: Diagnosis not present

## 2018-01-10 DIAGNOSIS — I2510 Atherosclerotic heart disease of native coronary artery without angina pectoris: Secondary | ICD-10-CM | POA: Diagnosis not present

## 2018-01-10 DIAGNOSIS — M2550 Pain in unspecified joint: Secondary | ICD-10-CM | POA: Diagnosis not present

## 2018-01-10 DIAGNOSIS — A419 Sepsis, unspecified organism: Secondary | ICD-10-CM | POA: Diagnosis not present

## 2018-01-10 DIAGNOSIS — L97509 Non-pressure chronic ulcer of other part of unspecified foot with unspecified severity: Secondary | ICD-10-CM | POA: Diagnosis not present

## 2018-01-10 DIAGNOSIS — I5032 Chronic diastolic (congestive) heart failure: Secondary | ICD-10-CM | POA: Diagnosis not present

## 2018-01-10 DIAGNOSIS — E039 Hypothyroidism, unspecified: Secondary | ICD-10-CM | POA: Diagnosis not present

## 2018-01-10 LAB — BASIC METABOLIC PANEL
ANION GAP: 12 (ref 5–15)
BUN: 19 mg/dL (ref 8–23)
CALCIUM: 8 mg/dL — AB (ref 8.9–10.3)
CO2: 17 mmol/L — ABNORMAL LOW (ref 22–32)
Chloride: 112 mmol/L — ABNORMAL HIGH (ref 98–111)
Creatinine, Ser: 1.29 mg/dL — ABNORMAL HIGH (ref 0.61–1.24)
GFR calc Af Amer: >60 mL/min (ref >60)
GFR, EST NON AFRICAN AMERICAN: 53 mL/min — AB (ref >60)
GLUCOSE: 89 mg/dL (ref 70–99)
Potassium: 3.1 mmol/L — ABNORMAL LOW (ref 3.5–5.1)
Sodium: 141 mmol/L (ref 135–145)

## 2018-01-10 LAB — PROTIME-INR
INR: 1.95
PROTHROMBIN TIME: 22 s — AB (ref 11.4–15.2)

## 2018-01-10 LAB — GLUCOSE, CAPILLARY
Glucose-Capillary: 75 mg/dL (ref 70–99)
Glucose-Capillary: 81 mg/dL (ref 70–99)

## 2018-01-10 LAB — MAGNESIUM: Magnesium: 2.2 mg/dL (ref 1.7–2.4)

## 2018-01-10 MED ORDER — POTASSIUM CHLORIDE CRYS ER 20 MEQ PO TBCR
40.0000 meq | EXTENDED_RELEASE_TABLET | Freq: Once | ORAL | Status: AC
  Administered 2018-01-10: 40 meq via ORAL
  Filled 2018-01-10: qty 2

## 2018-01-10 NOTE — Clinical Social Work Placement (Signed)
   CLINICAL SOCIAL WORK PLACEMENT  NOTE  Date:  01/10/2018  Patient Details  Name: RYZEN DEADY MRN: [id number] Date of Birth: 1940/12/19  Clinical Social Work is seeking post-discharge placement for this patient at the [place] level of care (*CSW will initial, date and re-position this form in  chart as items are completed):      Patient/family provided with [facility] Work Department's list of facilities offering this level of care within the geographic area requested by the patient (or if unable, by the patient's family).      Patient/family informed of their freedom to choose among providers that offer the needed level of care, that participate in Medicare, Medicaid or managed care program needed by the patient, have an available bed and are willing to accept the patient.      Patient/family informed of [facility]'s ownership interest in [facility] and [facility], as well as of the fact that they are under no obligation to receive care at these facilities.  PASRR submitted to EDS on       PASRR number received on       Existing PASRR number confirmed on       FL2 transmitted to all facilities in geographic area requested by pt/family on       FL2 transmitted to all facilities within larger geographic area on       Patient informed that his/her managed care company has contracts with or will negotiate with certain facilities, including the following:        Yes   Patient/family informed of bed offers received.  Patient chooses bed at [place] [place]     Physician recommends and patient chooses bed at      Patient to be transferred to [facility] on 01/10/18.  Patient to be transferred to facility by [facility]     Patient family notified on 01/10/18 of transfer.  Name of family member notified:  [name]     PHYSICIAN Please prepare prescriptions     Additional Comment:     _______________________________________________ Candie Chroman, LCSW 01/10/2018, 12:46 PM

## 2018-01-10 NOTE — Progress Notes (Signed)
[facility] here to transport pt to [facility], pt stable, pt was cleaned and new condom cath placed as pt is incx, pain meds given prn for c/o pain, called [facility] to give report called to Manus Gunning, all questions entertained and answered

## 2018-01-10 NOTE — Progress Notes (Signed)
PROGRESS NOTE    MALIKE FOGLIO  XBD:[id number] DOB: [birth year] DOA: 12/31/2017 PCP: Lawerance Cruel, MD   Brief Narrative:  77 year old with diabetes mellitus type 2, diastolic congestive heart failure, CAD, Addison's disease on steroids at home came with complaints of lower extremity pain swelling and erythema.  He was diagnosed with cellulitis.  Hospital course complicated by septic shock but improved with fluids and steroids.  While awaiting placement at skilled nursing facility he completed his IV course of antibiotic in the hospital.   Assessment & Plan:   Active Problems:   Hypothyroidism   CKD (chronic kidney disease) stage 3, GFR 30-59 ml/min (HCC)   Addison disease (HCC)   CAD S/P percutaneous coronary angioplasty   Anemia in CKD (chronic kidney disease)   Depression   Cellulitis   Pressure injury of skin  Sepsis secondary to right lower extremity cellulitis in the right side of his back; doing better.  Present on admission Mild dehydration.  Improved - Completed antibiotic course of vancomycin and Zosyn while awaiting placement.  Hydration status is better now.  Encouraged to take p.o. intake.  Hypokalemia- today's 3.1, will be repleted prior to his discharge.  History of Addison's disease - Resume home steroids  Insulin-dependent diabetes mellitus type 2; controlled -Resume home regimen  CKD stage III, stable  Hypothyroidism-continue Synthroid  Coronary artery disease-continue home meds, currently chest pain-free  History of pulmonary embolism-continue Coumadin, INR today is 1.95 pharmacy to manage.  Chronic diastolic congestive heart failure-appears well compensated.  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  Patient to be discharged today.  DVT prophylaxis: Coumadin Code Status: Full code Family Communication: None at bedside Disposition Plan: Should be able to discharge  the patient today.  Consultants:   None  Procedures:   None   Subjective: Remains afebrile, no acute events overnight.  His oral intake is slightly improved.  Review of Systems Otherwise negative except as per HPI, including: General = no fevers, chills, dizziness, malaise, fatigue HEENT/EYES = negative for pain, redness, loss of vision, double vision, blurred vision, loss of hearing, sore throat, hoarseness, dysphagia Cardiovascular= negative for chest pain, palpitation, murmurs, lower extremity swelling Respiratory/lungs= negative for shortness of breath, cough, hemoptysis, wheezing, mucus production Gastrointestinal= negative for nausea, vomiting,, abdominal pain, melena, hematemesis Genitourinary= negative for Dysuria, Hematuria, Change in Urinary Frequency MSK = Negative for arthralgia, myalgias, Back Pain, Joint swelling  Neurology= Negative for headache, seizures, numbness, tingling  Psychiatry= Negative for anxiety, depression, suicidal and homocidal ideation Allergy/Immunology= Medication/Food allergy as listed  Skin= Negative for Rash, lesions, ulcers, itching   Objective: Vitals:   01/09/18 1144 01/09/18 2042 01/10/18 0244 01/10/18 0454  BP: (!) 157/94 133/80 (!) 155/89 (!) 148/90  Pulse: 81 89 91 87  Resp: 19 20  20   Temp:  98.1 F (36.7 C) 97.8 F (36.6 C) (!) 97.5 F (36.4 C)  TempSrc:  Oral Oral Oral  SpO2: 95% 96% 98% 97%  Weight:    73.9 kg  Height:        Intake/Output Summary (Last 24 hours) at 01/10/2018 1118 Last data filed at 01/10/2018 1040 Gross per 24 hour  Intake 840 ml  Output 750 ml  Net 90 ml   Filed Weights   01/08/18 0535 01/09/18 0431 01/10/18 0454  Weight: 79.5 kg 78.5 kg 73.9 kg    Examination:  Constitutional: NAD, calm, comfortable Eyes: PERRL, lids and conjunctivae normal ENMT: Mucous membranes are  moist. Posterior pharynx clear of any exudate or lesions.Normal dentition.  Neck: normal, supple, no masses, no  thyromegaly Respiratory: clear to auscultation bilaterally, no wheezing, no crackles. Normal respiratory effort. No accessory muscle use.  Cardiovascular: Regular rate and rhythm, no murmurs / rubs / gallops. No extremity edema. 2+ pedal pulses. No carotid bruits.  Abdomen: no tenderness, no masses palpated. No hepatosplenomegaly. Bowel sounds positive.  Musculoskeletal: no clubbing / cyanosis. No joint deformity upper and lower extremities. Good ROM, no contractures. Normal muscle tone.  Skin: no rashes, lesions, ulcers. No induration Neurologic: CN 2-12 grossly intact. Sensation intact, DTR normal. Strength 4/5 in all 4.  Psychiatric: Normal judgment and insight. Alert and oriented x 3. Normal mood.   Data Reviewed:   CBC: No results for input(s): WBC, NEUTROABS, HGB, HCT, MCV, PLT in the last 168 hours. Basic Metabolic Panel: Recent Labs  Lab 01/04/18 0506 01/05/18 0408 01/06/18 0713 01/09/18 0421 01/10/18 0436  NA 141 138 138 140 141  K 3.4* 3.0* 3.5 3.8 3.1*  CL 118* 112* 111 109 112*  CO2 16* 17* 17* 21* 17*  GLUCOSE 83 68* 108* 119* 89  BUN 20 14 13 14 19   CREATININE 1.28* 1.12 1.14 1.34* 1.29*  CALCIUM 7.5* 7.4* 7.6* 8.2* 8.0*  MG  --   --  1.9 2.2 2.2   GFR: Estimated Creatinine Clearance: 49.5 mL/min (A) (by C-G formula based on SCr of 1.29 mg/dL (H)). Liver Function Tests: No results for input(s): AST, ALT, ALKPHOS, BILITOT, PROT, ALBUMIN in the last 168 hours. No results for input(s): LIPASE, AMYLASE in the last 168 hours. No results for input(s): AMMONIA in the last 168 hours. Coagulation Profile: Recent Labs  Lab 01/06/18 0713 01/07/18 0539 01/08/18 0352 01/09/18 0421 01/10/18 0436  INR 2.18 2.16 1.82 1.73 1.95   Cardiac Enzymes: No results for input(s): CKTOTAL, CKMB, CKMBINDEX, TROPONINI in the last 168 hours. BNP (last 3 results) No results for input(s): PROBNP in the last 8760 hours. HbA1C: No results for input(s): HGBA1C in the last 72  hours. CBG: Recent Labs  Lab 01/09/18 0725 01/09/18 1140 01/09/18 1629 01/09/18 2121 01/10/18 0758  GLUCAP 80 74 124* 112* 75   Lipid Profile: No results for input(s): CHOL, HDL, LDLCALC, TRIG, CHOLHDL, LDLDIRECT in the last 72 hours. Thyroid Function Tests: No results for input(s): TSH, T4TOTAL, FREET4, T3FREE, THYROIDAB in the last 72 hours. Anemia Panel: No results for input(s): VITAMINB12, FOLATE, FERRITIN, TIBC, IRON, RETICCTPCT in the last 72 hours. Sepsis Labs: No results for input(s): PROCALCITON, LATICACIDVEN in the last 168 hours.  Recent Results (from the past 240 hour(s))  Urine culture     Status: None   Collection Time: 12/31/17 12:53 PM  Result Value Ref Range Status   Specimen Description URINE, RANDOM  Final   Special Requests unknown Normal  Final   Culture   Final    NO GROWTH Performed at [facility], [address] [place],  [address]    Report Status 01/01/2018 FINAL  Final  MRSA PCR Screening     Status: Abnormal   Collection Time: 12/31/17  5:38 PM  Result Value Ref Range Status   MRSA by PCR POSITIVE (A) NEGATIVE Final    Comment:        The GeneXpert MRSA Assay (FDA approved for NASAL specimens only), is one component of a comprehensive MRSA colonization surveillance program. It is not intended to diagnose MRSA infection nor to guide or monitor treatment for MRSA  infections. CRITICAL RESULT CALLED TO, READ BACK BY AND VERIFIED WITHMarylen Ponto RN [phone] BY GF Performed at [facility], [place] [address], [place], [place] [address]          Radiology Studies: No results found.      Scheduled Meds: . carvedilol  6.25 mg Oral BID WC  . collagenase   Topical Daily  . ferrous sulfate  325 mg Oral BID WC  . folic acid  0.5 mg Oral Daily  . hydrocortisone  20 mg Oral TID  . insulin aspart  0-5 Units Subcutaneous QHS  . insulin aspart  0-9 Units Subcutaneous TID WC  . insulin glargine  26 Units Subcutaneous  QHS  . megestrol  20 mg Oral Daily  . methocarbamol  500 mg Oral TID  . senna-docusate  2 tablet Oral BID  . thyroid  60 mg Oral QAC breakfast  . traZODone  100 mg Oral QHS  . vitamin B-12  1,000 mcg Oral Daily  . Warfarin - Pharmacist Dosing Inpatient   Does not apply q1800   Continuous Infusions: . sodium chloride 250 mL (01/06/18 0552)     LOS: 10 days   Time spent=15 minutes    Deseree Zemaitis Arsenio Loader, MD [facility] Pager [phone]   If 7PM-7AM, please contact night-coverage [email or web address] Password [email or web address] 01/10/2018, 11:18 AM

## 2018-01-10 NOTE — Clinical Social Work Note (Signed)
Patient can discharge to [facility] today once discharge summary is updated. MD aware.  Dayton Scrape, [place]

## 2018-01-10 NOTE — Clinical Social Work Note (Signed)
CSW facilitated patient discharge including contacting patient family and facility to confirm patient discharge plans. Clinical information faxed to facility and family agreeable with plan. CSW arranged ambulance transport via [facility] to [facility]. RN to call report prior to discharge [phone] Room 807A).  CSW will sign off for now as social work intervention is no longer needed. Please consult [place] again if new needs arise.  Dayton Scrape, [place]

## 2018-01-12 DIAGNOSIS — I5032 Chronic diastolic (congestive) heart failure: Secondary | ICD-10-CM | POA: Diagnosis not present

## 2018-01-12 DIAGNOSIS — L03119 Cellulitis of unspecified part of limb: Secondary | ICD-10-CM | POA: Diagnosis not present

## 2018-01-12 DIAGNOSIS — L97509 Non-pressure chronic ulcer of other part of unspecified foot with unspecified severity: Secondary | ICD-10-CM | POA: Diagnosis not present

## 2018-01-12 DIAGNOSIS — E1169 Type 2 diabetes mellitus with other specified complication: Secondary | ICD-10-CM | POA: Diagnosis not present

## 2018-01-17 DIAGNOSIS — L89210 Pressure ulcer of right hip, unstageable: Secondary | ICD-10-CM | POA: Diagnosis not present

## 2018-01-17 DIAGNOSIS — L8992 Pressure ulcer of unspecified site, stage 2: Secondary | ICD-10-CM | POA: Diagnosis not present

## 2018-01-25 DIAGNOSIS — I2510 Atherosclerotic heart disease of native coronary artery without angina pectoris: Secondary | ICD-10-CM | POA: Diagnosis not present

## 2018-01-25 DIAGNOSIS — E271 Primary adrenocortical insufficiency: Secondary | ICD-10-CM | POA: Diagnosis not present

## 2018-01-25 DIAGNOSIS — L89892 Pressure ulcer of other site, stage 2: Secondary | ICD-10-CM | POA: Diagnosis not present

## 2018-01-25 DIAGNOSIS — N183 Chronic kidney disease, stage 3 (moderate): Secondary | ICD-10-CM | POA: Diagnosis not present

## 2018-01-25 DIAGNOSIS — A419 Sepsis, unspecified organism: Secondary | ICD-10-CM | POA: Diagnosis not present

## 2018-01-25 DIAGNOSIS — D631 Anemia in chronic kidney disease: Secondary | ICD-10-CM | POA: Diagnosis not present

## 2018-01-25 DIAGNOSIS — L0390 Cellulitis, unspecified: Secondary | ICD-10-CM | POA: Diagnosis not present

## 2018-01-25 DIAGNOSIS — L89210 Pressure ulcer of right hip, unstageable: Secondary | ICD-10-CM | POA: Diagnosis not present

## 2018-01-25 DIAGNOSIS — E039 Hypothyroidism, unspecified: Secondary | ICD-10-CM | POA: Diagnosis not present

## 2018-01-31 DIAGNOSIS — L89210 Pressure ulcer of right hip, unstageable: Secondary | ICD-10-CM | POA: Diagnosis not present

## 2018-01-31 DIAGNOSIS — L8992 Pressure ulcer of unspecified site, stage 2: Secondary | ICD-10-CM | POA: Diagnosis not present

## 2018-02-01 DIAGNOSIS — G4700 Insomnia, unspecified: Secondary | ICD-10-CM | POA: Diagnosis not present

## 2018-02-01 DIAGNOSIS — F419 Anxiety disorder, unspecified: Secondary | ICD-10-CM | POA: Diagnosis not present

## 2018-02-01 DIAGNOSIS — F0391 Unspecified dementia with behavioral disturbance: Secondary | ICD-10-CM | POA: Diagnosis not present

## 2018-02-07 DIAGNOSIS — L8992 Pressure ulcer of unspecified site, stage 2: Secondary | ICD-10-CM | POA: Diagnosis not present

## 2018-02-07 DIAGNOSIS — L89210 Pressure ulcer of right hip, unstageable: Secondary | ICD-10-CM | POA: Diagnosis not present

## 2018-02-10 DIAGNOSIS — I5032 Chronic diastolic (congestive) heart failure: Secondary | ICD-10-CM | POA: Diagnosis not present

## 2018-02-10 DIAGNOSIS — R0902 Hypoxemia: Secondary | ICD-10-CM | POA: Diagnosis not present

## 2018-02-10 DIAGNOSIS — J449 Chronic obstructive pulmonary disease, unspecified: Secondary | ICD-10-CM | POA: Diagnosis not present

## 2018-02-14 DIAGNOSIS — L8992 Pressure ulcer of unspecified site, stage 2: Secondary | ICD-10-CM | POA: Diagnosis not present

## 2018-02-14 DIAGNOSIS — L89210 Pressure ulcer of right hip, unstageable: Secondary | ICD-10-CM | POA: Diagnosis not present

## 2018-02-15 DIAGNOSIS — G4700 Insomnia, unspecified: Secondary | ICD-10-CM | POA: Diagnosis not present

## 2018-02-15 DIAGNOSIS — F419 Anxiety disorder, unspecified: Secondary | ICD-10-CM | POA: Diagnosis not present

## 2018-02-15 DIAGNOSIS — F0391 Unspecified dementia with behavioral disturbance: Secondary | ICD-10-CM | POA: Diagnosis not present

## 2018-02-15 DIAGNOSIS — F39 Unspecified mood [affective] disorder: Secondary | ICD-10-CM | POA: Diagnosis not present

## 2018-02-18 DIAGNOSIS — F0390 Unspecified dementia without behavioral disturbance: Secondary | ICD-10-CM | POA: Diagnosis not present

## 2018-02-18 DIAGNOSIS — R451 Restlessness and agitation: Secondary | ICD-10-CM | POA: Diagnosis not present

## 2018-02-18 DIAGNOSIS — I509 Heart failure, unspecified: Secondary | ICD-10-CM | POA: Diagnosis not present

## 2018-02-21 DIAGNOSIS — L89210 Pressure ulcer of right hip, unstageable: Secondary | ICD-10-CM | POA: Diagnosis not present

## 2018-02-21 DIAGNOSIS — L8992 Pressure ulcer of unspecified site, stage 2: Secondary | ICD-10-CM | POA: Diagnosis not present

## 2018-02-23 ENCOUNTER — Telehealth: Payer: Self-pay

## 2018-02-23 NOTE — Telephone Encounter (Signed)
Pt spouse states that pt in [facility] longterm care and coumadin will be managed there

## 2018-02-28 DIAGNOSIS — Z7901 Long term (current) use of anticoagulants: Secondary | ICD-10-CM | POA: Diagnosis not present

## 2018-02-28 DIAGNOSIS — L89213 Pressure ulcer of right hip, stage 3: Secondary | ICD-10-CM | POA: Diagnosis not present

## 2018-02-28 DIAGNOSIS — I4891 Unspecified atrial fibrillation: Secondary | ICD-10-CM | POA: Diagnosis not present

## 2018-02-28 DIAGNOSIS — L89210 Pressure ulcer of right hip, unstageable: Secondary | ICD-10-CM | POA: Diagnosis not present

## 2018-03-03 DIAGNOSIS — F0391 Unspecified dementia with behavioral disturbance: Secondary | ICD-10-CM | POA: Diagnosis not present

## 2018-03-03 DIAGNOSIS — G4700 Insomnia, unspecified: Secondary | ICD-10-CM | POA: Diagnosis not present

## 2018-03-03 DIAGNOSIS — F419 Anxiety disorder, unspecified: Secondary | ICD-10-CM | POA: Diagnosis not present

## 2018-03-03 DIAGNOSIS — F39 Unspecified mood [affective] disorder: Secondary | ICD-10-CM | POA: Diagnosis not present

## 2018-03-07 DIAGNOSIS — I4891 Unspecified atrial fibrillation: Secondary | ICD-10-CM | POA: Diagnosis not present

## 2018-03-07 DIAGNOSIS — L89210 Pressure ulcer of right hip, unstageable: Secondary | ICD-10-CM | POA: Diagnosis not present

## 2018-03-07 DIAGNOSIS — Z7901 Long term (current) use of anticoagulants: Secondary | ICD-10-CM | POA: Diagnosis not present

## 2018-03-07 DIAGNOSIS — L89213 Pressure ulcer of right hip, stage 3: Secondary | ICD-10-CM | POA: Diagnosis not present

## 2018-03-13 DIAGNOSIS — Z710 Person encountering health services to consult on behalf of another person: Secondary | ICD-10-CM | POA: Diagnosis not present

## 2018-03-13 DIAGNOSIS — L03119 Cellulitis of unspecified part of limb: Secondary | ICD-10-CM | POA: Diagnosis not present

## 2018-03-13 DIAGNOSIS — G629 Polyneuropathy, unspecified: Secondary | ICD-10-CM | POA: Diagnosis not present

## 2018-03-13 DIAGNOSIS — I5032 Chronic diastolic (congestive) heart failure: Secondary | ICD-10-CM | POA: Diagnosis not present

## 2018-03-13 DIAGNOSIS — F0390 Unspecified dementia without behavioral disturbance: Secondary | ICD-10-CM | POA: Diagnosis not present

## 2018-03-13 DIAGNOSIS — I509 Heart failure, unspecified: Secondary | ICD-10-CM | POA: Diagnosis not present

## 2018-03-13 DIAGNOSIS — R451 Restlessness and agitation: Secondary | ICD-10-CM | POA: Diagnosis not present

## 2018-03-13 DIAGNOSIS — Z7189 Other specified counseling: Secondary | ICD-10-CM | POA: Diagnosis not present

## 2018-03-13 DIAGNOSIS — E1169 Type 2 diabetes mellitus with other specified complication: Secondary | ICD-10-CM | POA: Diagnosis not present

## 2018-03-14 DIAGNOSIS — L89213 Pressure ulcer of right hip, stage 3: Secondary | ICD-10-CM | POA: Diagnosis not present

## 2018-03-21 DIAGNOSIS — L89213 Pressure ulcer of right hip, stage 3: Secondary | ICD-10-CM | POA: Diagnosis not present

## 2018-03-22 DIAGNOSIS — Z79899 Other long term (current) drug therapy: Secondary | ICD-10-CM | POA: Diagnosis not present

## 2018-03-22 DIAGNOSIS — I4891 Unspecified atrial fibrillation: Secondary | ICD-10-CM | POA: Diagnosis not present

## 2018-03-28 DIAGNOSIS — L89213 Pressure ulcer of right hip, stage 3: Secondary | ICD-10-CM | POA: Diagnosis not present

## 2018-03-30 ENCOUNTER — Telehealth: Payer: Self-pay | Admitting: Neurology

## 2018-03-30 NOTE — Telephone Encounter (Signed)
Patient's wife called [phone] and would like to speak with you regarding her husband. He is in a Nursing facility right now due to him not being able to walk. Please Call. Thanks

## 2018-04-04 DIAGNOSIS — L89213 Pressure ulcer of right hip, stage 3: Secondary | ICD-10-CM | POA: Diagnosis not present

## 2018-04-09 DIAGNOSIS — R0602 Shortness of breath: Secondary | ICD-10-CM | POA: Diagnosis not present

## 2018-04-09 DIAGNOSIS — R0902 Hypoxemia: Secondary | ICD-10-CM | POA: Diagnosis not present

## 2018-04-11 DIAGNOSIS — L89213 Pressure ulcer of right hip, stage 3: Secondary | ICD-10-CM | POA: Diagnosis not present

## 2018-04-14 DIAGNOSIS — R319 Hematuria, unspecified: Secondary | ICD-10-CM | POA: Diagnosis not present

## 2018-04-14 DIAGNOSIS — Z79899 Other long term (current) drug therapy: Secondary | ICD-10-CM | POA: Diagnosis not present

## 2018-04-14 DIAGNOSIS — N390 Urinary tract infection, site not specified: Secondary | ICD-10-CM | POA: Diagnosis not present

## 2018-04-14 DIAGNOSIS — D649 Anemia, unspecified: Secondary | ICD-10-CM | POA: Diagnosis not present

## 2018-04-17 DIAGNOSIS — Z79899 Other long term (current) drug therapy: Secondary | ICD-10-CM | POA: Diagnosis not present

## 2018-04-17 DIAGNOSIS — I4891 Unspecified atrial fibrillation: Secondary | ICD-10-CM | POA: Diagnosis not present

## 2018-04-18 DIAGNOSIS — R609 Edema, unspecified: Secondary | ICD-10-CM | POA: Diagnosis not present

## 2018-04-18 DIAGNOSIS — L89213 Pressure ulcer of right hip, stage 3: Secondary | ICD-10-CM | POA: Diagnosis not present

## 2018-04-19 DIAGNOSIS — I4891 Unspecified atrial fibrillation: Secondary | ICD-10-CM | POA: Diagnosis not present

## 2018-04-21 DIAGNOSIS — I4891 Unspecified atrial fibrillation: Secondary | ICD-10-CM | POA: Diagnosis not present

## 2018-04-24 ENCOUNTER — Encounter

## 2018-04-24 ENCOUNTER — Ambulatory Visit: Payer: Medicare HMO | Admitting: Neurology

## 2018-04-25 DIAGNOSIS — L89213 Pressure ulcer of right hip, stage 3: Secondary | ICD-10-CM | POA: Diagnosis not present

## 2018-04-26 DIAGNOSIS — G4700 Insomnia, unspecified: Secondary | ICD-10-CM | POA: Diagnosis not present

## 2018-04-26 DIAGNOSIS — F0391 Unspecified dementia with behavioral disturbance: Secondary | ICD-10-CM | POA: Diagnosis not present

## 2018-04-26 DIAGNOSIS — F39 Unspecified mood [affective] disorder: Secondary | ICD-10-CM | POA: Diagnosis not present

## 2018-04-26 DIAGNOSIS — F419 Anxiety disorder, unspecified: Secondary | ICD-10-CM | POA: Diagnosis not present

## 2018-04-28 DIAGNOSIS — Z79899 Other long term (current) drug therapy: Secondary | ICD-10-CM | POA: Diagnosis not present

## 2018-04-28 DIAGNOSIS — I4891 Unspecified atrial fibrillation: Secondary | ICD-10-CM | POA: Diagnosis not present

## 2018-05-02 DIAGNOSIS — L89213 Pressure ulcer of right hip, stage 3: Secondary | ICD-10-CM | POA: Diagnosis not present

## 2018-05-09 DIAGNOSIS — L89213 Pressure ulcer of right hip, stage 3: Secondary | ICD-10-CM | POA: Diagnosis not present

## 2018-05-14 DIAGNOSIS — Z79899 Other long term (current) drug therapy: Secondary | ICD-10-CM | POA: Diagnosis not present

## 2018-05-19 DIAGNOSIS — I4891 Unspecified atrial fibrillation: Secondary | ICD-10-CM | POA: Diagnosis not present

## 2018-05-22 ENCOUNTER — Encounter (HOSPITAL_COMMUNITY): Payer: Self-pay | Admitting: Emergency Medicine

## 2018-05-22 ENCOUNTER — Other Ambulatory Visit: Payer: Self-pay

## 2018-05-22 ENCOUNTER — Emergency Department (HOSPITAL_COMMUNITY): Payer: Medicare HMO

## 2018-05-22 ENCOUNTER — Inpatient Hospital Stay (HOSPITAL_COMMUNITY)
Admission: EM | Admit: 2018-05-22 | Discharge: 2018-06-26 | DRG: 871 | Disposition: E | Payer: Medicare HMO | Attending: Family Medicine | Admitting: Family Medicine

## 2018-05-22 DIAGNOSIS — E271 Primary adrenocortical insufficiency: Secondary | ICD-10-CM | POA: Diagnosis not present

## 2018-05-22 DIAGNOSIS — F0391 Unspecified dementia with behavioral disturbance: Secondary | ICD-10-CM | POA: Diagnosis present

## 2018-05-22 DIAGNOSIS — E872 Acidosis: Secondary | ICD-10-CM | POA: Diagnosis present

## 2018-05-22 DIAGNOSIS — L89210 Pressure ulcer of right hip, unstageable: Secondary | ICD-10-CM | POA: Diagnosis present

## 2018-05-22 DIAGNOSIS — I5032 Chronic diastolic (congestive) heart failure: Secondary | ICD-10-CM | POA: Diagnosis present

## 2018-05-22 DIAGNOSIS — Z7401 Bed confinement status: Secondary | ICD-10-CM

## 2018-05-22 DIAGNOSIS — A419 Sepsis, unspecified organism: Secondary | ICD-10-CM

## 2018-05-22 DIAGNOSIS — Z6831 Body mass index (BMI) 31.0-31.9, adult: Secondary | ICD-10-CM

## 2018-05-22 DIAGNOSIS — Z66 Do not resuscitate: Secondary | ICD-10-CM | POA: Diagnosis not present

## 2018-05-22 DIAGNOSIS — Z7952 Long term (current) use of systemic steroids: Secondary | ICD-10-CM

## 2018-05-22 DIAGNOSIS — N183 Chronic kidney disease, stage 3 (moderate): Secondary | ICD-10-CM | POA: Diagnosis present

## 2018-05-22 DIAGNOSIS — A4189 Other specified sepsis: Secondary | ICD-10-CM | POA: Diagnosis not present

## 2018-05-22 DIAGNOSIS — E119 Type 2 diabetes mellitus without complications: Secondary | ICD-10-CM | POA: Diagnosis not present

## 2018-05-22 DIAGNOSIS — Z79891 Long term (current) use of opiate analgesic: Secondary | ICD-10-CM

## 2018-05-22 DIAGNOSIS — R569 Unspecified convulsions: Secondary | ICD-10-CM | POA: Diagnosis present

## 2018-05-22 DIAGNOSIS — Z8711 Personal history of peptic ulcer disease: Secondary | ICD-10-CM

## 2018-05-22 DIAGNOSIS — R9082 White matter disease, unspecified: Secondary | ICD-10-CM | POA: Diagnosis present

## 2018-05-22 DIAGNOSIS — Z515 Encounter for palliative care: Secondary | ICD-10-CM | POA: Diagnosis present

## 2018-05-22 DIAGNOSIS — R6510 Systemic inflammatory response syndrome (SIRS) of non-infectious origin without acute organ dysfunction: Secondary | ICD-10-CM | POA: Diagnosis present

## 2018-05-22 DIAGNOSIS — R0602 Shortness of breath: Secondary | ICD-10-CM | POA: Diagnosis not present

## 2018-05-22 DIAGNOSIS — E291 Testicular hypofunction: Secondary | ICD-10-CM | POA: Diagnosis present

## 2018-05-22 DIAGNOSIS — F321 Major depressive disorder, single episode, moderate: Secondary | ICD-10-CM | POA: Diagnosis not present

## 2018-05-22 DIAGNOSIS — Z9049 Acquired absence of other specified parts of digestive tract: Secondary | ICD-10-CM

## 2018-05-22 DIAGNOSIS — R531 Weakness: Secondary | ICD-10-CM | POA: Diagnosis not present

## 2018-05-22 DIAGNOSIS — J9621 Acute and chronic respiratory failure with hypoxia: Secondary | ICD-10-CM | POA: Diagnosis not present

## 2018-05-22 DIAGNOSIS — R4182 Altered mental status, unspecified: Secondary | ICD-10-CM

## 2018-05-22 DIAGNOSIS — E1151 Type 2 diabetes mellitus with diabetic peripheral angiopathy without gangrene: Secondary | ICD-10-CM | POA: Diagnosis present

## 2018-05-22 DIAGNOSIS — R000 Tachycardia, unspecified: Secondary | ICD-10-CM | POA: Diagnosis not present

## 2018-05-22 DIAGNOSIS — E1142 Type 2 diabetes mellitus with diabetic polyneuropathy: Secondary | ICD-10-CM | POA: Diagnosis present

## 2018-05-22 DIAGNOSIS — Z86711 Personal history of pulmonary embolism: Secondary | ICD-10-CM

## 2018-05-22 DIAGNOSIS — I959 Hypotension, unspecified: Secondary | ICD-10-CM | POA: Diagnosis not present

## 2018-05-22 DIAGNOSIS — Z794 Long term (current) use of insulin: Secondary | ICD-10-CM

## 2018-05-22 DIAGNOSIS — F32A Depression, unspecified: Secondary | ICD-10-CM | POA: Diagnosis present

## 2018-05-22 DIAGNOSIS — Z955 Presence of coronary angioplasty implant and graft: Secondary | ICD-10-CM

## 2018-05-22 DIAGNOSIS — E669 Obesity, unspecified: Secondary | ICD-10-CM | POA: Diagnosis present

## 2018-05-22 DIAGNOSIS — E1122 Type 2 diabetes mellitus with diabetic chronic kidney disease: Secondary | ICD-10-CM | POA: Diagnosis present

## 2018-05-22 DIAGNOSIS — L89151 Pressure ulcer of sacral region, stage 1: Secondary | ICD-10-CM | POA: Diagnosis present

## 2018-05-22 DIAGNOSIS — Z79899 Other long term (current) drug therapy: Secondary | ICD-10-CM

## 2018-05-22 DIAGNOSIS — Z7901 Long term (current) use of anticoagulants: Secondary | ICD-10-CM

## 2018-05-22 DIAGNOSIS — J189 Pneumonia, unspecified organism: Secondary | ICD-10-CM | POA: Diagnosis present

## 2018-05-22 DIAGNOSIS — G9341 Metabolic encephalopathy: Secondary | ICD-10-CM | POA: Diagnosis present

## 2018-05-22 DIAGNOSIS — M549 Dorsalgia, unspecified: Secondary | ICD-10-CM | POA: Diagnosis present

## 2018-05-22 DIAGNOSIS — J988 Other specified respiratory disorders: Principal | ICD-10-CM

## 2018-05-22 DIAGNOSIS — E039 Hypothyroidism, unspecified: Secondary | ICD-10-CM | POA: Diagnosis present

## 2018-05-22 DIAGNOSIS — R0902 Hypoxemia: Secondary | ICD-10-CM

## 2018-05-22 DIAGNOSIS — K219 Gastro-esophageal reflux disease without esophagitis: Secondary | ICD-10-CM | POA: Diagnosis present

## 2018-05-22 DIAGNOSIS — F05 Delirium due to known physiological condition: Secondary | ICD-10-CM | POA: Diagnosis not present

## 2018-05-22 DIAGNOSIS — I130 Hypertensive heart and chronic kidney disease with heart failure and stage 1 through stage 4 chronic kidney disease, or unspecified chronic kidney disease: Secondary | ICD-10-CM | POA: Diagnosis present

## 2018-05-22 DIAGNOSIS — I2510 Atherosclerotic heart disease of native coronary artery without angina pectoris: Secondary | ICD-10-CM | POA: Diagnosis present

## 2018-05-22 DIAGNOSIS — J690 Pneumonitis due to inhalation of food and vomit: Secondary | ICD-10-CM | POA: Diagnosis not present

## 2018-05-22 DIAGNOSIS — L89893 Pressure ulcer of other site, stage 3: Secondary | ICD-10-CM | POA: Diagnosis present

## 2018-05-22 DIAGNOSIS — J069 Acute upper respiratory infection, unspecified: Secondary | ICD-10-CM

## 2018-05-22 DIAGNOSIS — R509 Fever, unspecified: Secondary | ICD-10-CM | POA: Diagnosis not present

## 2018-05-22 DIAGNOSIS — G8929 Other chronic pain: Secondary | ICD-10-CM | POA: Diagnosis present

## 2018-05-22 DIAGNOSIS — R918 Other nonspecific abnormal finding of lung field: Secondary | ICD-10-CM | POA: Diagnosis not present

## 2018-05-22 DIAGNOSIS — E876 Hypokalemia: Secondary | ICD-10-CM | POA: Diagnosis present

## 2018-05-22 DIAGNOSIS — R0689 Other abnormalities of breathing: Secondary | ICD-10-CM | POA: Diagnosis not present

## 2018-05-22 DIAGNOSIS — L89102 Pressure ulcer of unspecified part of back, stage 2: Secondary | ICD-10-CM | POA: Diagnosis present

## 2018-05-22 DIAGNOSIS — R791 Abnormal coagulation profile: Secondary | ICD-10-CM | POA: Diagnosis present

## 2018-05-22 DIAGNOSIS — R0600 Dyspnea, unspecified: Secondary | ICD-10-CM

## 2018-05-22 DIAGNOSIS — J9811 Atelectasis: Secondary | ICD-10-CM | POA: Diagnosis not present

## 2018-05-22 DIAGNOSIS — Z79890 Hormone replacement therapy (postmenopausal): Secondary | ICD-10-CM

## 2018-05-22 DIAGNOSIS — F0390 Unspecified dementia without behavioral disturbance: Secondary | ICD-10-CM | POA: Diagnosis not present

## 2018-05-22 DIAGNOSIS — D631 Anemia in chronic kidney disease: Secondary | ICD-10-CM | POA: Diagnosis present

## 2018-05-22 DIAGNOSIS — E870 Hyperosmolality and hypernatremia: Secondary | ICD-10-CM | POA: Diagnosis not present

## 2018-05-22 DIAGNOSIS — R1310 Dysphagia, unspecified: Secondary | ICD-10-CM | POA: Diagnosis not present

## 2018-05-22 DIAGNOSIS — E11649 Type 2 diabetes mellitus with hypoglycemia without coma: Secondary | ICD-10-CM | POA: Diagnosis not present

## 2018-05-22 DIAGNOSIS — I272 Pulmonary hypertension, unspecified: Secondary | ICD-10-CM | POA: Diagnosis present

## 2018-05-22 DIAGNOSIS — F329 Major depressive disorder, single episode, unspecified: Secondary | ICD-10-CM | POA: Diagnosis present

## 2018-05-22 DIAGNOSIS — R404 Transient alteration of awareness: Secondary | ICD-10-CM | POA: Diagnosis not present

## 2018-05-22 LAB — C-REACTIVE PROTEIN: CRP: 5.2 mg/dL — ABNORMAL HIGH (ref <1.0)

## 2018-05-22 LAB — LACTIC ACID, PLASMA
Lactic Acid, Venous: 1.9 mmol/L (ref 0.5–1.9)
Lactic Acid, Venous: 1.6 mmol/L (ref 0.5–1.9)

## 2018-05-22 LAB — PROCALCITONIN: Procalcitonin: 0.15 ng/mL

## 2018-05-22 LAB — COMPREHENSIVE METABOLIC PANEL WITH GFR
ALT: 21 U/L (ref 0–44)
AST: 20 U/L (ref 15–41)
Albumin: 2.8 g/dL — ABNORMAL LOW (ref 3.5–5.0)
Alkaline Phosphatase: 76 U/L (ref 38–126)
Anion gap: 11 (ref 5–15)
BUN: 18 mg/dL (ref 8–23)
CO2: 26 mmol/L (ref 22–32)
Calcium: 8.7 mg/dL — ABNORMAL LOW (ref 8.9–10.3)
Chloride: 102 mmol/L (ref 98–111)
Creatinine, Ser: 1.34 mg/dL — ABNORMAL HIGH (ref 0.61–1.24)
GFR calc Af Amer: 59 mL/min — ABNORMAL LOW
GFR calc non Af Amer: 51 mL/min — ABNORMAL LOW
Glucose, Bld: 86 mg/dL (ref 70–99)
Potassium: 3.8 mmol/L (ref 3.5–5.1)
Sodium: 139 mmol/L (ref 135–145)
Total Bilirubin: 0.8 mg/dL (ref 0.3–1.2)
Total Protein: 5.8 g/dL — ABNORMAL LOW (ref 6.5–8.1)

## 2018-05-22 LAB — TRIGLYCERIDES: Triglycerides: 105 mg/dL (ref <150)

## 2018-05-22 LAB — CBC WITH DIFFERENTIAL/PLATELET
Abs Immature Granulocytes: 0.08 K/uL — ABNORMAL HIGH (ref 0.00–0.07)
Basophils Absolute: 0.1 K/uL (ref 0.0–0.1)
Basophils Relative: 1 %
Eosinophils Absolute: 0.3 K/uL (ref 0.0–0.5)
Eosinophils Relative: 3 %
HCT: 46.3 % (ref 39.0–52.0)
Hemoglobin: 14.7 g/dL (ref 13.0–17.0)
Immature Granulocytes: 1 %
Lymphocytes Relative: 13 %
Lymphs Abs: 1.5 K/uL (ref 0.7–4.0)
MCH: 28.9 pg (ref 26.0–34.0)
MCHC: 31.7 g/dL (ref 30.0–36.0)
MCV: 91.1 fL (ref 80.0–100.0)
Monocytes Absolute: 1.4 K/uL — ABNORMAL HIGH (ref 0.1–1.0)
Monocytes Relative: 12 %
Neutro Abs: 8.4 K/uL — ABNORMAL HIGH (ref 1.7–7.7)
Neutrophils Relative %: 70 %
Platelets: 229 K/uL (ref 150–400)
RBC: 5.08 MIL/uL (ref 4.22–5.81)
RDW: 17 % — ABNORMAL HIGH (ref 11.5–15.5)
WBC: 11.7 K/uL — ABNORMAL HIGH (ref 4.0–10.5)
nRBC: 0 % (ref 0.0–0.2)

## 2018-05-22 LAB — APTT: aPTT: 41 s — ABNORMAL HIGH (ref 24–36)

## 2018-05-22 LAB — FIBRINOGEN: Fibrinogen: 486 mg/dL — ABNORMAL HIGH (ref 210–475)

## 2018-05-22 LAB — D-DIMER, QUANTITATIVE: D-Dimer, Quant: 1.24 ug{FEU}/mL — ABNORMAL HIGH (ref 0.00–0.50)

## 2018-05-22 LAB — LACTATE DEHYDROGENASE: LDH: 175 U/L (ref 98–192)

## 2018-05-22 LAB — GLUCOSE, CAPILLARY: Glucose-Capillary: 102 mg/dL — ABNORMAL HIGH (ref 70–99)

## 2018-05-22 LAB — FERRITIN: Ferritin: 49 ng/mL (ref 24–336)

## 2018-05-22 LAB — SARS CORONAVIRUS 2 BY RT PCR (HOSPITAL ORDER, PERFORMED IN ~~LOC~~ HOSPITAL LAB): SARS Coronavirus 2: POSITIVE — AB

## 2018-05-22 LAB — I-STAT CREATININE, ED: Creatinine, Ser: 1.2 mg/dL (ref 0.61–1.24)

## 2018-05-22 LAB — ETHANOL: Alcohol, Ethyl (B): <10 mg/dL (ref <10)

## 2018-05-22 LAB — PROTIME-INR
INR: 2.6 — ABNORMAL HIGH (ref 0.8–1.2)
Prothrombin Time: 27.7 seconds — ABNORMAL HIGH (ref 11.4–15.2)

## 2018-05-22 LAB — ABO/RH: ABO/RH(D): B POS

## 2018-05-22 MED ORDER — HYDROCORTISONE NA SUCCINATE PF 100 MG IJ SOLR
100.0000 mg | Freq: Once | INTRAMUSCULAR | Status: AC
  Administered 2018-05-22: 100 mg via INTRAVENOUS
  Filled 2018-05-22: qty 2

## 2018-05-22 MED ORDER — IOHEXOL 350 MG/ML SOLN
75.0000 mL | Freq: Once | INTRAVENOUS | Status: AC | PRN
  Administered 2018-05-22: 13:00:00 75 mL via INTRAVENOUS

## 2018-05-22 MED ORDER — SODIUM CHLORIDE 0.9 % IV BOLUS
1000.0000 mL | Freq: Once | INTRAVENOUS | Status: AC
  Administered 2018-05-22: 1000 mL via INTRAVENOUS

## 2018-05-22 MED ORDER — STROKE: EARLY STAGES OF RECOVERY BOOK
Freq: Once | Status: AC
  Administered 2018-05-23: 05:00:00

## 2018-05-22 MED ORDER — VANCOMYCIN HCL 10 G IV SOLR
1250.0000 mg | INTRAVENOUS | Status: DC
  Administered 2018-05-23: 1250 mg via INTRAVENOUS
  Filled 2018-05-22: qty 1250

## 2018-05-22 MED ORDER — HYDROCORTISONE NA SUCCINATE PF 100 MG IJ SOLR
50.0000 mg | Freq: Three times a day (TID) | INTRAMUSCULAR | Status: AC
  Administered 2018-05-23 (×3): 50 mg via INTRAVENOUS
  Filled 2018-05-22 (×3): qty 2

## 2018-05-22 MED ORDER — HYDROCOD POLST-CPM POLST ER 10-8 MG/5ML PO SUER
5.0000 mL | Freq: Two times a day (BID) | ORAL | Status: DC | PRN
  Administered 2018-05-31: 5 mL via ORAL
  Filled 2018-05-22: qty 5

## 2018-05-22 MED ORDER — METRONIDAZOLE IN NACL 5-0.79 MG/ML-% IV SOLN
500.0000 mg | Freq: Once | INTRAVENOUS | Status: AC
  Administered 2018-05-22: 14:00:00 500 mg via INTRAVENOUS
  Filled 2018-05-22: qty 100

## 2018-05-22 MED ORDER — SODIUM CHLORIDE 0.9 % IV SOLN
2.0000 g | Freq: Two times a day (BID) | INTRAVENOUS | Status: DC
  Administered 2018-05-23 – 2018-05-28 (×11): 2 g via INTRAVENOUS
  Filled 2018-05-22 (×12): qty 2

## 2018-05-22 MED ORDER — ACETAMINOPHEN 325 MG PO TABS
650.0000 mg | ORAL_TABLET | Freq: Once | ORAL | Status: AC
  Administered 2018-05-22: 650 mg via ORAL
  Filled 2018-05-22: qty 2

## 2018-05-22 MED ORDER — WARFARIN - PHARMACIST DOSING INPATIENT
Freq: Every day | Status: DC
  Administered 2018-05-24 – 2018-06-04 (×4)

## 2018-05-22 MED ORDER — SODIUM CHLORIDE 0.9 % IV SOLN
2.0000 g | Freq: Once | INTRAVENOUS | Status: AC
  Administered 2018-05-22: 2 g via INTRAVENOUS
  Filled 2018-05-22: qty 2

## 2018-05-22 MED ORDER — VANCOMYCIN HCL IN DEXTROSE 1-5 GM/200ML-% IV SOLN: 1000.0000 mg | Freq: Once | INTRAVENOUS | Status: DC

## 2018-05-22 MED ORDER — WARFARIN SODIUM 5 MG PO TABS
5.0000 mg | ORAL_TABLET | Freq: Once | ORAL | Status: DC
  Filled 2018-05-22: qty 1

## 2018-05-22 MED ORDER — ALBUTEROL SULFATE HFA 108 (90 BASE) MCG/ACT IN AERS
2.0000 | INHALATION_SPRAY | Freq: Four times a day (QID) | RESPIRATORY_TRACT | Status: DC
  Administered 2018-05-22 – 2018-05-25 (×10): 2 via RESPIRATORY_TRACT
  Filled 2018-05-22 (×2): qty 6.7

## 2018-05-22 MED ORDER — SODIUM CHLORIDE 0.9 % IV SOLN
INTRAVENOUS | Status: DC
  Administered 2018-05-22 – 2018-05-23 (×2): via INTRAVENOUS

## 2018-05-22 MED ORDER — ONDANSETRON HCL 4 MG/2ML IJ SOLN: 4.0000 mg | Freq: Four times a day (QID) | INTRAMUSCULAR | Status: DC | PRN

## 2018-05-22 MED ORDER — ZINC SULFATE 220 (50 ZN) MG PO CAPS
220.0000 mg | ORAL_CAPSULE | Freq: Every day | ORAL | Status: DC
  Administered 2018-05-24 – 2018-05-29 (×6): 220 mg via ORAL
  Filled 2018-05-22 (×6): qty 1

## 2018-05-22 MED ORDER — VITAMIN C 500 MG PO TABS
500.0000 mg | ORAL_TABLET | Freq: Every day | ORAL | Status: DC
  Administered 2018-05-24 – 2018-05-29 (×6): 500 mg via ORAL
  Filled 2018-05-22 (×6): qty 1

## 2018-05-22 MED ORDER — ONDANSETRON HCL 4 MG PO TABS: 4.0000 mg | ORAL_TABLET | Freq: Four times a day (QID) | ORAL | Status: DC | PRN

## 2018-05-22 MED ORDER — GUAIFENESIN-DM 100-10 MG/5ML PO SYRP
10.0000 mL | ORAL_SOLUTION | ORAL | Status: DC | PRN
  Administered 2018-05-31 – 2018-06-05 (×2): 10 mL via ORAL
  Filled 2018-05-22 (×3): qty 10

## 2018-05-22 MED ORDER — VANCOMYCIN HCL 10 G IV SOLR
1500.0000 mg | Freq: Once | INTRAVENOUS | Status: AC
  Administered 2018-05-22: 1500 mg via INTRAVENOUS
  Filled 2018-05-22: qty 1500

## 2018-05-22 NOTE — Code Documentation (Signed)
78yo male arriving to [facility] via [facility] at 60. Patient from [facility] facility with altered mental status. Patient noted to have right sided weakness and code stroke activated. Of note, patient febrile on arrival. Stroke team responded to CT. CT completed followed by CTA. NIHSS 12, see documentation for details and code stroke times. Patient is outside the window for treatment with tPA. CTA negative for LVO. No acute stroke treatment at this time. Handoff with ED RN Tawanna Solo.

## 2018-05-22 NOTE — ED Notes (Signed)
Difficulty getting blood/IV access

## 2018-05-22 NOTE — H&P (Signed)
History and Physical    [name] MVH:[id number] DOB: [birth year] DOA: 05/02/2018  Referring MD/NP/PA: Marijean Bravo, PA-C PCP: Lawerance Cruel, MD  Patient coming from: [place] home via EMS  Chief Complaint: Altered mental status  I have personally briefly reviewed patient's old medical records in [place]   HPI: [name] is a 78 y.o. male with medical history significant of CHF, h/o GI bleeding, Addison's disease, dementia, anemia chronic disease, pulmonary embolus on Coumadin; who presented from skilled nursing facility after being found acutely altered this morning.  Patient  reportedly talkative at baseline.  History is otherwise limited as patient is currently altered.  Patient comes from facility with multiple other positive COVID patient..  ED Course: On admission into the emergency department patient was seen to be febrile up to 102.4 F, pulse 87 108, respirations 16-24, blood pressure 80/59 improved to 135/75 after IV fluids, and O2 saturations 94 to 99% on 3- 4 L nasal cannula oxygen.  Patient was seen as a code stroke and initial CT scan labs revealed no acute signs of a stroke.  WBC 11.7, BUN 18, creatinine 1.34, glucose 86, and INR 2.6.  Chest x-ray revealed low inspiratory volumes and bibasilar atelectasis.  Neurology evaluated and recommended completion of stroke work-up.  CT angiogram of the head and neck showed greater than 50% stenosis with calcified and soft plaque at the left internal carotid artery origin that is non-flow reducing.   Review of Systems  Unable to perform ROS: Mental status change    Past Medical History:  Diagnosis Date   Acute lower GI bleeding    a. admitted to [facility] 04/11/2013;  b. 04/2014 EGD: 1.  gastric erosions, mild prox gastritis and bulbar duodenitis->Protonix.   Addison's disease ([place])    Anemia    Anemia in CKD (chronic kidney disease) 06/25/2015   Chronic back pain    Chronic diastolic CHF (congestive heart  failure) ([place])    a. His initial ejection fraction was between 30 and 35%;  b. 04/2013 Echo: EF 50-55% Gr1 DD, mild-mod MR;  c. 04/2014 Echo: EF 60-65%, Gr 1 DD, triv TR.   CKD (chronic kidney disease), stage III (HCC)    Coronary artery disease    a. 07/2014 - s/p difficult PCI - had pressure wire analysis of LAD s/p DES in mid segment c/b wire-induced dissection in the distal segment of the LAD which was treated with repeated balloon inflations. Also had DES to rPDA - again had either spasm distal to the stent placement or an edge dissection but the vessel was small in that area; procedure aborted.   Daily headache    GERD (gastroesophageal reflux disease)    H/O hiatal hernia    Hepatitis    a. 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   History of aortic insufficiency    Hx of cardiovascular stress test    a. ETT-Myoview 7/14:  Low risk, inferior defect consistent with thinning, no ischemia, normal wall motion, EF 54%   Hypertension    Hypogonadism male    Obesity    Peptic ulcer disease    Pulmonary embolism ([place])    a. 04/2014 CTA: Bilat submassive PE ->coumadin.   Pulmonary HTN ([place])    Thyroid disease     Past Surgical History:  Procedure Laterality Date   APPENDECTOMY  1950's   CARDIAC CATHETERIZATION  06/26/2008   EF 30-35%   CARDIAC CATHETERIZATION N/A 08/20/2014   Procedure: Right/Left Heart Cath and Coronary  Angiography;  Surgeon: Jolaine Artist, MD;  Location: [facility];  Service: Cardiovascular;  Laterality: N/A;   CARDIAC CATHETERIZATION N/A 08/21/2014   Procedure: Coronary Stent Intervention;  Surgeon: Wellington Hampshire, MD;  Location: [facility];  Service: Cardiovascular;  Laterality: N/A;   CARDIAC CATHETERIZATION N/A 11/24/2015   Procedure: Right/Left Heart Cath and Coronary Angiography;  Surgeon: Jolaine Artist, MD;  Location: [facility];  Service: Cardiovascular;  Laterality: N/A;   CHOLECYSTECTOMY  ~ 1965   ESOPHAGOGASTRODUODENOSCOPY N/A  04/11/2013   Procedure: ESOPHAGOGASTRODUODENOSCOPY (EGD);  Surgeon: Missy Sabins, MD;  Location: [facility] ENDOSCOPY;  Service: Endoscopy;  Laterality: N/A;   ESOPHAGOGASTRODUODENOSCOPY N/A 05/13/2014   Procedure: ESOPHAGOGASTRODUODENOSCOPY (EGD);  Surgeon: Arta Silence, MD;  Location: [facility] ENDOSCOPY;  Service: Endoscopy;  Laterality: N/A;   TONSILLECTOMY  1940's   US ECHOCARDIOGRAPHY  04/09/2010   EF 60-65%     reports that he has never smoked. He has never used smokeless tobacco. He reports that he does not drink alcohol or use drugs.  No Known Allergies  Family History  Problem Relation Age of Onset   Clotting disorder Mother    Clotting disorder Brother    Clotting disorder Brother    Clotting disorder Brother    Clotting disorder Other        Niece.   Heart attack Neg Hx     Prior to Admission medications   Medication Sig Start Date End Date Taking? Authorizing Provider  acetaminophen (TYLENOL) 500 MG tablet Take 500 mg by mouth every 6 (six) hours as needed for headache (pain).   Yes [provider]  Amino Acids-Protein Hydrolys (FEEDING SUPPLEMENT, PRO-STAT SUGAR FREE 64,) LIQD Take 30 mLs by mouth 2 (two) times a day.   Yes [provider]  CALCIUM-MAGNESIUM-ZINC PO Take 1 tablet by mouth daily.    Yes [provider]  calcium-vitamin D (OSCAL WITH D) 500-200 MG-UNIT tablet Take 1 tablet by mouth 2 (two) times daily.   Yes [provider]  carvedilol (COREG) 6.25 MG tablet Take 1 tablet (6.25 mg total) by mouth 2 (two) times daily with a meal. 01/26/17  Yes Larey Dresser, MD  Cholecalciferol (VITAMIN D-3) 1000 units CAPS Take 1,000 Units by mouth daily.   Yes [provider]  divalproex (DEPAKOTE) 125 MG DR tablet Take 125 mg by mouth 3 (three) times daily.   Yes [provider]  escitalopram (LEXAPRO) 10 MG tablet Take 10 mg by mouth daily.   Yes [provider]  ferrous sulfate 325 (65 FE) MG tablet Take 1  tablet (325 mg total) by mouth 2 (two) times daily with a meal. 01/04/18 05/14/2018 Yes Amin, Ankit Chirag, MD  FLUPHENAZINE HCL IJ Inject 37.5 mg into the muscle every 14 (fourteen) days.   Yes [provider]  folic acid (FOLVITE) 1 MG tablet Take 1 mg by mouth daily.   Yes [provider]  furosemide (LASIX) 20 MG tablet Take 1 tablet (20 mg total) by mouth every Monday, Wednesday, and Friday. 12/02/17 12/02/18 Yes Emokpae, Courage, MD  gabapentin (NEURONTIN) 600 MG tablet Take 600 mg by mouth 3 (three) times daily.    Yes [provider]  hydrocortisone (CORTEF) 20 MG tablet Take 10-20 mg by mouth See admin instructions. Take 10 mg by mouth every morning, take 20 mg at noon and take 20 mg at night.   Yes [provider]  ibandronate (BONIVA) 150 MG tablet Take 150  mg by mouth every 30 (thirty) days. Take in the morning with a full glass of water, on an empty stomach, and do not take anything else by mouth or lie down for the next 30 min.   Yes [provider]  Insulin Glargine (LANTUS) 100 UNIT/ML Solostar Pen Inject 26 Units into the skin at bedtime. 12/01/17  Yes Roxan Hockey, MD  Multiple Vitamin (MULTIVITAMIN WITH MINERALS) TABS tablet Take 1 tablet by mouth 2 (two) times a day.   Yes [provider]  nitroGLYCERIN (NITROSTAT) 0.4 MG SL tablet PLACE 1 TABLET UNDER TONGUE EVERY 5 MINUTES AS NEEDED FOR CHEST PAIN (UP TO 3 DOSES) Patient taking differently: Place 0.4 mg under the tongue every 5 (five) minutes as needed for chest pain. Up to 3 doses 03/22/16  Yes [name], Wonda Cheng, MD  OXYGEN Inhale 2 L into the lungs continuous.   Yes [provider]  potassium chloride (K-DUR) 10 MEQ tablet Take 10 mEq by mouth daily.   Yes [provider]  potassium chloride (K-DUR,KLOR-CON) 10 MEQ tablet Take 10 mEq by mouth daily.    Yes [provider]  senna-docusate (SENOKOT-S) 8.6-50 MG tablet Take 2 tablets by mouth at bedtime as  needed for mild constipation or moderate constipation. 01/04/18 01/04/19 Yes Amin, Jeanella Flattery, MD  thyroid (ARMOUR) 60 MG tablet Take 60 mg by mouth daily.    Yes [provider]  traMADol (ULTRAM) 50 MG tablet Take 100 mg by mouth 3 (three) times daily. 12/14/17  Yes [provider]  traZODone (DESYREL) 150 MG tablet Take 75 mg by mouth at bedtime.  04/16/14  Yes [provider]  trihexyphenidyl (ARTANE) 2 MG tablet Take 2 mg by mouth daily.   Yes [provider]  vitamin B-12 (CYANOCOBALAMIN) 100 MCG tablet Take 100 mcg by mouth daily.   Yes [provider]  vitamin E 400 UNIT capsule Take 400 Units by mouth daily.    Yes [provider]  warfarin (COUMADIN) 2.5 MG tablet Take 2.5 mg by mouth See admin instructions. Tues and Fri   Yes [provider]  warfarin (COUMADIN) 5 MG tablet Take 5 mg by mouth See admin instructions. MON WED THUR SAT SUN   Yes [provider]  warfarin (COUMADIN) 3 MG tablet TAKE AS DIRECTED BY COUMADIN CLINIC Patient not taking: Reported on 05/14/2018 12/20/17   [name], Wonda Cheng, MD    Physical Exam:  Constitutional: Obese male who appears to be acutely altered and slow to respond. Vitals:   05/18/2018 1605 05/17/2018 1615 05/13/2018 1630 05/23/2018 1645  BP: (!) 104/59 121/67 (!) 101/59 103/63  Pulse: 89 88 87 88  Resp: 18 18 16 18   Temp:      TempSrc:      SpO2: 97% 97% 95% 96%  Weight:      Height:       Eyes: PERRL, lids and conjunctivae normal ENMT: Mucous membranes are dry. Posterior pharynx clear of any exudate or lesions. Neck: normal, supple, no masses, no thyromegaly Respiratory: Decreased overall aeration currently on 4 L nasal cannula oxygen.  Normal respiratory effort. Cardiovascular: Regular rate and rhythm, no murmurs / rubs / gallops.  Trace bilateral lower extremity edema. 2+ pedal pulses. No carotid bruits.  Abdomen: no tenderness, no masses palpated. No hepatosplenomegaly.  Bowel sounds positive.  Musculoskeletal: no clubbing / cyanosis. No joint deformity upper and lower extremities. Good ROM, no contractures. Normal muscle tone.  Skin: no rashes, lesions, ulcers. No  induration Neurologic: CN 2-12 grossly intact.  Limited speech.  Right-sided weakness Psychiatric:   Lethargic and oriented x 2. Normal mood.     Labs on Admission: I have personally reviewed following labs and imaging studies  CBC: Recent Labs  Lab 05/18/2018 1208  WBC 11.7*  NEUTROABS 8.4*  HGB 14.7  HCT 46.3  MCV 91.1  PLT 671   Basic Metabolic Panel: Recent Labs  Lab 05/24/2018 1208 04/27/2018 1236  NA 139  --   K 3.8  --   CL 102  --   CO2 26  --   GLUCOSE 86  --   BUN 18  --   CREATININE 1.34* 1.20  CALCIUM 8.7*  --    GFR: Estimated Creatinine Clearance: 53.2 mL/min (by C-G formula based on SCr of 1.2 mg/dL). Liver Function Tests: Recent Labs  Lab 05/03/2018 1208  AST 20  ALT 21  ALKPHOS 76  BILITOT 0.8  PROT 5.8*  ALBUMIN 2.8*   No results for input(s): LIPASE, AMYLASE in the last 168 hours. No results for input(s): AMMONIA in the last 168 hours. Coagulation Profile: Recent Labs  Lab 05/16/2018 1208  INR 2.6*   Cardiac Enzymes: No results for input(s): CKTOTAL, CKMB, CKMBINDEX, TROPONINI in the last 168 hours. BNP (last 3 results) No results for input(s): PROBNP in the last 8760 hours. HbA1C: No results for input(s): HGBA1C in the last 72 hours. CBG: No results for input(s): GLUCAP in the last 168 hours. Lipid Profile: Recent Labs    05/03/2018 1210  TRIG 105   Thyroid Function Tests: No results for input(s): TSH, T4TOTAL, FREET4, T3FREE, THYROIDAB in the last 72 hours. Anemia Panel: Recent Labs    04/29/2018 1208  FERRITIN 49   Urine analysis:    Component Value Date/Time   COLORURINE YELLOW 12/31/2017 [place] 12/31/2017 1253   LABSPEC 1.017 12/31/2017 1253   PHURINE 5.0 12/31/2017 1253   GLUCOSEU NEGATIVE 12/31/2017 1253    HGBUR NEGATIVE 12/31/2017 1253   BILIRUBINUR NEGATIVE 12/31/2017 1253   KETONESUR NEGATIVE 12/31/2017 1253   PROTEINUR NEGATIVE 12/31/2017 1253   UROBILINOGEN 0.2 03/21/2007 1015   NITRITE NEGATIVE 12/31/2017 1253   LEUKOCYTESUR NEGATIVE 12/31/2017 1253   Sepsis Labs: Recent Results (from the past 240 hour(s))  SARS Coronavirus 2 [facility] order, Performed in [place] hospital lab)     Status: Abnormal   Collection Time: 05/01/2018 12:10 PM  Result Value Ref Range Status   SARS Coronavirus 2 POSITIVE (A) NEGATIVE Final    Comment: RESULT CALLED TO, READ BACK BY AND VERIFIED WITH: RN Lowell Bouton [id number] MLM (NOTE) If result is NEGATIVE SARS-CoV-2 target nucleic acids are NOT DETECTED. The SARS-CoV-2 RNA is generally detectable in upper and lower  respiratory specimens during the acute phase of infection. The lowest  concentration of SARS-CoV-2 viral copies this assay can detect is 250  copies / mL. A negative result does not preclude SARS-CoV-2 infection  and should not be used as the sole basis for treatment or other  patient management decisions.  A negative result may occur with  improper specimen collection / handling, submission of specimen other  than nasopharyngeal swab, presence of viral mutation(s) within the  areas targeted by this assay, and inadequate number of viral copies  (<250 copies / mL). A negative result must be combined with clinical  observations, patient history, and epidemiological information. If result is POSITIVE SARS-CoV-2 target nucleic acids are DETECTED. The SARS-Co V-2 RNA is generally  detectable in upper and lower  respiratory specimens during the acute phase of infection.  Positive  results are indicative of active infection with SARS-CoV-2.  Clinical  correlation with patient history and other diagnostic information is  necessary to determine patient infection status.  Positive results do  not rule out bacterial infection or co-infection with  other viruses. If result is PRESUMPTIVE POSTIVE SARS-CoV-2 nucleic acids MAY BE PRESENT.   A presumptive positive result was obtained on the submitted specimen  and confirmed on repeat testing.  While 2019 novel coronavirus  (SARS-CoV-2) nucleic acids may be present in the submitted sample  additional confirmatory testing may be necessary for epidemiological  and / or clinical management purposes  to differentiate between  SARS-CoV-2 and other Sarbecovirus currently known to infect humans.  If clinically indicated additional testing with an alternate test  methodology [phone]) is advised.  The SARS-CoV-2 RNA is generally  detectable in upper and lower respiratory specimens during the acute  phase of infection. The expected result is Negative. Fact Sheet for Patients:  StrictlyIdeas.no Fact Sheet for Healthcare Providers: BankingDealers.co.za This test is not yet approved or cleared by the [place] FDA and has been authorized for detection and/or diagnosis of SARS-CoV-2 by FDA under an Emergency Use Authorization (EUA).  This EUA will remain in effect (meaning this test can be used) for the duration of the COVID-19 declaration under Section 564(b)(1) of the Act, 21 U.S.C. section 360bbb-3(b)(1), unless the authorization is terminated or revoked sooner. Performed at [facility], [place] [address] [place], [place] [address]      Radiological Exams on Admission: Ct Angio Head W Or Wo Contrast  Result Date: 05/20/2018 CLINICAL DATA:  RIGHT-sided weakness. Altered mental status. Facial droop. EXAM: CT ANGIOGRAPHY HEAD AND NECK TECHNIQUE: Multidetector CT imaging of the head and neck was performed using the standard protocol during bolus administration of intravenous contrast. Multiplanar CT image reconstructions and MIPs were obtained to evaluate the vascular anatomy. Carotid stenosis measurements (when applicable) are obtained  utilizing NASCET criteria, using the distal internal carotid diameter as the denominator. CONTRAST:  16mL OMNIPAQUE IOHEXOL 350 MG/ML SOLN COMPARISON:  Code stroke CT earlier in the day. FINDINGS: CTA NECK FINDINGS Aortic arch: Standard branching. Imaged portion shows no evidence of aneurysm or dissection. No significant stenosis of the major arch vessel origins. Aortic atherosclerosis. Right carotid system: No evidence of dissection, stenosis (50% or greater) or occlusion. Moderate calcific and soft plaque at the bifurcation. Left carotid system: Slightly greater than 50% irregular stenosis, calcific and soft plaque, at the RIGHT internal carotid artery origin. This is based on luminal measurements of 1.9/4.1 proximal/distal. No dissection. No intraluminal thrombus. Vertebral arteries: Nonstenotic calcific plaque at both vertebral origins. Both vessels are patent through the neck, codominant. Skeleton: Spondylosis. No worrisome osseous lesion. Other neck: No neck masses. Upper chest: Incompletely visualized cloudlike airspace opacity RIGHT upper lobe posterior segment. Small RIGHT pleural effusion. Review of the MIP images confirms the above findings CTA HEAD FINDINGS Anterior circulation: No significant stenosis, proximal occlusion, aneurysm, or vascular malformation. Nonstenotic atheromatous change both carotid siphons. No M1 or M2 disease of significance. Dominant RIGHT A1 ACA. Posterior circulation: No significant stenosis, proximal occlusion, aneurysm, or vascular malformation. Venous sinuses: As permitted by contrast timing, patent. Anatomic variants: None of significance. Delayed phase: Not performed. Review of the MIP images confirms the above findings IMPRESSION: 1. Slightly greater than 50% stenosis, calcific and soft plaque at the LEFT internal carotid artery origin. This  is non flow reducing. 2. No intracranial stenosis or vascular occlusion. 3. Incompletely visualized cloudy airspace opacity RIGHT  upper lobe posterior segment, which could represent pneumonia. Small RIGHT pleural effusion. 4. These results were communicated to Dr. Lorraine Lax at [place] 04/28/2020by text page via the [facility] messaging system. Electronically Signed   By: Staci Righter M.D.   On: 04/28/2018 13:20   Ct Angio Neck W Or Wo Contrast  Result Date: 05/05/2018 CLINICAL DATA:  RIGHT-sided weakness. Altered mental status. Facial droop. EXAM: CT ANGIOGRAPHY HEAD AND NECK TECHNIQUE: Multidetector CT imaging of the head and neck was performed using the standard protocol during bolus administration of intravenous contrast. Multiplanar CT image reconstructions and MIPs were obtained to evaluate the vascular anatomy. Carotid stenosis measurements (when applicable) are obtained utilizing NASCET criteria, using the distal internal carotid diameter as the denominator. CONTRAST:  31mL OMNIPAQUE IOHEXOL 350 MG/ML SOLN COMPARISON:  Code stroke CT earlier in the day. FINDINGS: CTA NECK FINDINGS Aortic arch: Standard branching. Imaged portion shows no evidence of aneurysm or dissection. No significant stenosis of the major arch vessel origins. Aortic atherosclerosis. Right carotid system: No evidence of dissection, stenosis (50% or greater) or occlusion. Moderate calcific and soft plaque at the bifurcation. Left carotid system: Slightly greater than 50% irregular stenosis, calcific and soft plaque, at the RIGHT internal carotid artery origin. This is based on luminal measurements of 1.9/4.1 proximal/distal. No dissection. No intraluminal thrombus. Vertebral arteries: Nonstenotic calcific plaque at both vertebral origins. Both vessels are patent through the neck, codominant. Skeleton: Spondylosis. No worrisome osseous lesion. Other neck: No neck masses. Upper chest: Incompletely visualized cloudlike airspace opacity RIGHT upper lobe posterior segment. Small RIGHT pleural effusion. Review of the MIP images confirms the above findings CTA HEAD FINDINGS  Anterior circulation: No significant stenosis, proximal occlusion, aneurysm, or vascular malformation. Nonstenotic atheromatous change both carotid siphons. No M1 or M2 disease of significance. Dominant RIGHT A1 ACA. Posterior circulation: No significant stenosis, proximal occlusion, aneurysm, or vascular malformation. Venous sinuses: As permitted by contrast timing, patent. Anatomic variants: None of significance. Delayed phase: Not performed. Review of the MIP images confirms the above findings IMPRESSION: 1. Slightly greater than 50% stenosis, calcific and soft plaque at the LEFT internal carotid artery origin. This is non flow reducing. 2. No intracranial stenosis or vascular occlusion. 3. Incompletely visualized cloudy airspace opacity RIGHT upper lobe posterior segment, which could represent pneumonia. Small RIGHT pleural effusion. 4. These results were communicated to Dr. Lorraine Lax at [place] 04/10/2020by text page via the [facility] messaging system. Electronically Signed   By: Staci Righter M.D.   On: 05/14/2018 13:20   Dg Chest Portable 1 View  Result Date: 05/15/2018 CLINICAL DATA:  78 year old male with altered mental status, lethargic, wheezing EXAM: PORTABLE CHEST 1 VIEW COMPARISON:  Prior chest x-ray 12/31/2017 FINDINGS: Lower inspiratory volumes with minimal bibasilar atelectasis. No evidence of pulmonary edema, focal airspace consolidation, pleural effusion or pneumothorax. Stable cardiac and mediastinal contours. Atherosclerotic calcifications visualized in the transverse aorta. No acute osseous abnormality. Surgical clips in the right upper quadrant suggest prior cholecystectomy. IMPRESSION: Low inspiratory volumes with bibasilar atelectasis. Otherwise, no acute cardiopulmonary process. Electronically Signed   By: Jacqulynn Cadet M.D.   On: 05/22/2018 13:33   Ct Head Code Stroke Wo Contrast  Result Date: 05/22/2018 CLINICAL DATA:  Code stroke. RIGHT-sided weakness, altered mental status with  facial droop. EXAM: CT HEAD WITHOUT CONTRAST TECHNIQUE: Contiguous axial images were obtained from the base of the skull through the  vertex without intravenous contrast. COMPARISON:  04/22/2017. FINDINGS: Brain: No evidence for acute infarction, hemorrhage, mass lesion, hydrocephalus, or extra-axial fluid. Generalized atrophy. T2 and FLAIR hyperintensities throughout the white matter, consistent with small vessel disease. Vascular: Calcification of the cavernous internal carotid arteries consistent with cerebrovascular atherosclerotic disease. No signs of intracranial large vessel occlusion. Skull: Normal. Negative for fracture or focal lesion. Sinuses/Orbits: Chronic RIGHT maxillary sinusitis. Negative orbits. Other: None. ASPECTS [facility] Stroke Program Early CT Score) - Ganglionic level infarction (caudate, lentiform nuclei, internal capsule, insula, M1-M3 cortex): 7 - Supraganglionic infarction (M4-M6 cortex): 3 Total score (0-10 with 10 being normal): 10 IMPRESSION: 1. Atrophy and small vessel disease. No acute intracranial findings. 2. ASPECTS is 10. 3. These results were communicated to Dr. Lorraine Lax at 1:07 pmon 04/24/2020by text page via the [facility] messaging system. * Electronically Signed   By: Staci Righter M.D.   On: 05/12/2018 13:09    EKG: Independently reviewed.  Junctional accelerated rhythm  Assessment/Plan  Sepsis 2/2 COVID-19 with Acute on chronic respiratory failure: Patient presents acutely febrile up to 102.4 F, tachycardic, tachypneic, and hypotensive.  Labs revealed WBC 11.7 with lactic acid 1.9 and procalcitonin 0.15.  Patient noted to be COVID positive.  Empirically started on broad-spectrum antibiotics.  Patient requiring 4 L nasal oxygen to maintain O2 saturations. -COVID admission order set utilized -Nasal cannula oxygen to maintain O2 saturations -Follow-up blood and urine studies -Continue empiric antibiotics -De-escalate antibiotics when medically appropriate -Albuterol  inhaler -Follow-up in a.m. labs per protocol    Right-sided weakness, left internal carotid artery stenosis: Patient noted to haveright-sided weakness on admission.  CT angiogram of the head and neck revealed greater than 50% stenosis of the internal carotid on the left. -N.p.o. until able to pass swallow screen -Neuro checks -Check MRI of the brain -Check EEG -PT/OT/speech to eval and treat  -Appreciate neurology consultative services, will follow for further recommendation  Transient hypotension: On admission blood pressures noted to be as low as 80/59.  Blood pressures improved with IV fluids.   -Continue IV fluids as needed overnight  Addison's disease, on chronic steroids: In the emergency department he initially received 100 mcg of Solu-Cortef as a stress dose.   -Solu-Cortef 50 mg IV every 8 hours -Restart home oral dose when able  Diastolic congestive heart: Last EF 50 to 55% with grade 1 diastolic dysfunction in 04/1658.  Patient does not appear to be grossly fluid overloaded at this time. -Strict intake and output -Daily weights  Insulin-dependent diabetes mellitus type 2: Last hemoglobin A1c 7.5 on 11/28/2017.  Glucose has been at the lower end of normal during this hospitalization -Hypoglycemic protocol -CBGs every 4 hours overnight -Restart insulin regimen when medically appropriate and tolerating oral  Hypothyroidism: Last TSH 1.435 on 01/01/2018. -Continue Armour Thyroid when able  History of PE on chronic anticoagulation: INR therapeutic at 2.6 -Coumadin per pharmacy(recommending holding additional Coumadin dosing until MRI complete)  Chronic kidney disease stage III: Stable.  History of mild dementia   DVT prophylaxis: Coumadin Code Status: full Family Communication: No family present at bedside Disposition Plan: To be determined Consults called: Neurology Admission status: inpatient   Norval Morton MD [facility] Pager [phone]   If  7PM-7AM, please contact night-coverage [email or web address] Password [facility]  05/22/2018, 5:13 PM

## 2018-05-22 NOTE — Progress Notes (Signed)
Pharmacy Antibiotic Note  [name] is a 78 y.o. male admitted on 05/13/2018 with sepsis. Pharmacy has been consulted for vancomycin and cefepime dosing. Pt is febrile with Tmax 102.4 and WBC is elevated.   Plan: Vancomycin 1500mg  IV x 1 then 1250mg  IV Q24H Cefepime 2gm IV Q12H F/u renal fxn, C&S, clinical status and peak/trough at SS  Height: 5\' 10"  (177.8 cm) Weight: 162 lb 14.7 oz (73.9 kg)(per previous admission) IBW/kg (Calculated) : 73  No data recorded.  No results for input(s): WBC, CREATININE, LATICACIDVEN, VANCOTROUGH, VANCOPEAK, VANCORANDOM, GENTTROUGH, GENTPEAK, GENTRANDOM, TOBRATROUGH, TOBRAPEAK, TOBRARND, AMIKACINPEAK, AMIKACINTROU, AMIKACIN in the last 168 hours.  CrCl cannot be calculated (Patient's most recent lab result is older than the maximum 21 days allowed.).    No Known Allergies  Antimicrobials this admission: Vanc 4/27>> Cefepime 4/27>> Flagyl x 1 4/27  Dose adjustments this admission: N/A  Microbiology results: Pending  Thank you for allowing pharmacy to be a part of this patient's care.  Raynie Steinhaus, Rande Lawman 05/25/2018 12:11 PM

## 2018-05-22 NOTE — ED Notes (Signed)
Nurse navigator spoke with patient's son [name] and wife [name] on speaker phone. Wife wanted to add home phone [phone]. They understand no visitors at this time and notified plan of care and waiting for results. Verbalized understanding and thankful for update.

## 2018-05-22 NOTE — ED Provider Notes (Signed)
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 with COVID.  CXR negative.  CT head negative for acute findings. CTA head/neck negative for LVO. MRI added at neurology's recommendation. Unclear if AMS secondary to acute CVA, encephalopathy 2/2 COVID, or other cause. Recommended proceeding with CVA w/u.   Will plan for admission for further tx of acute hypoxic respiratory failure 2/2 covid and CVA w/u.  BP became lower and 2L IVF added. BP improving after IVF.   Case discussed with Dr. Alvino Chapel who personally evaluated this pt and is in agreement with plan.  3:55 PM CONSULT with Dr. Tamala Julian who accepts pt for admission.   ED Discharge Orders    None       Bishop Dublin 05/18/2018 1556    Davonna Belling, MD 05/23/18 [phone]

## 2018-05-22 NOTE — ED Notes (Signed)
Phil ([facility]/called) @1226 -per Dr.Pickering-called by Levada Dy

## 2018-05-22 NOTE — ED Notes (Signed)
ED TO INPATIENT HANDOFF REPORT  ED Nurse Name and Phone #: [phone]  S Name/Age/Gender [name] 78 y.o. male Room/Bed: 024C/024C  Code Status   Code Status: Prior  Home/SNF/Other Nursing Home Patient oriented to: self Is this baseline? No   Triage Complete: Triage complete  Chief Complaint ams  Triage Note Pt arrives via EMS from [place] with altered mental status. Pt has hx of dementia. EMS reports that pt has been lethargic today not eating like normal. O2 90% on RA.    Allergies No Known Allergies  Level of Care/Admitting Diagnosis ED Disposition    ED Disposition Condition [facility] Area: [place] [[id number]]  Level of Care: Progressive [102]  Covid Evaluation: Confirmed COVID Positive  Isolation Risk Level: Low Risk  (Less than 4L [place] supplementation)  Diagnosis: COVID-19 [[id number]]  Admitting Physician: Norval Morton [[id number]]  Attending Physician: Norval Morton [[id number]]  Estimated length of stay: past midnight tomorrow  Certification:: I certify this patient will need inpatient services for at least 2 midnights  PT Class (Do Not Modify): Inpatient [101]  PT Acc Code (Do Not Modify): Private [1]       B Medical/Surgery History Past Medical History:  Diagnosis Date  . Acute lower GI bleeding    a. admitted to [facility] 04/11/2013;  b. 04/2014 EGD: 1.  gastric erosions, mild prox gastritis and bulbar duodenitis->Protonix.  . Addison's disease ([place])   . Anemia   . Anemia in CKD (chronic kidney disease) 06/25/2015  . Chronic back pain   . Chronic diastolic CHF (congestive heart failure) ([place])    a. His initial ejection fraction was between 30 and 35%;  b. 04/2013 Echo: EF 50-55% Gr1 DD, mild-mod MR;  c. 04/2014 Echo: EF 60-65%, Gr 1 DD, triv TR.  . CKD (chronic kidney disease), stage III ([place])   . Coronary artery disease    a. 07/2014 - s/p difficult PCI - had pressure wire analysis of LAD s/p DES in mid  segment c/b wire-induced dissection in the distal segment of the LAD which was treated with repeated balloon inflations. Also had DES to rPDA - again had either spasm distal to the stent placement or an edge dissection but the vessel was small in that area; procedure aborted.  . Daily headache   . GERD (gastroesophageal reflux disease)   . H/O hiatal hernia   . Hepatitis    a. 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  . History of aortic insufficiency   . Hx of cardiovascular stress test    a. ETT-Myoview 7/14:  Low risk, inferior defect consistent with thinning, no ischemia, normal wall motion, EF 54%  . Hypertension   . Hypogonadism male   . Obesity   . Peptic ulcer disease   . Pulmonary embolism ([place])    a. 04/2014 CTA: Bilat submassive PE ->coumadin.  . Pulmonary HTN ([place])   . Thyroid disease    Past Surgical History:  Procedure Laterality Date  . APPENDECTOMY  1950's  . CARDIAC CATHETERIZATION  06/26/2008   EF 30-35%  . CARDIAC CATHETERIZATION N/A 08/20/2014   Procedure: Right/Left Heart Cath and Coronary Angiography;  Surgeon: Jolaine Artist, MD;  Location: [facility];  Service: Cardiovascular;  Laterality: N/A;  . CARDIAC CATHETERIZATION N/A 08/21/2014   Procedure: Coronary Stent Intervention;  Surgeon: Wellington Hampshire, MD;  Location: [facility];  Service: Cardiovascular;  Laterality: N/A;  . CARDIAC CATHETERIZATION N/A  11/24/2015   Procedure: Right/Left Heart Cath and Coronary Angiography;  Surgeon: Jolaine Artist, MD;  Location: [facility];  Service: Cardiovascular;  Laterality: N/A;  . CHOLECYSTECTOMY  ~ 1965  . ESOPHAGOGASTRODUODENOSCOPY N/A 04/11/2013   Procedure: ESOPHAGOGASTRODUODENOSCOPY (EGD);  Surgeon: Missy Sabins, MD;  Location: [facility] ENDOSCOPY;  Service: Endoscopy;  Laterality: N/A;  . ESOPHAGOGASTRODUODENOSCOPY N/A 05/13/2014   Procedure: ESOPHAGOGASTRODUODENOSCOPY (EGD);  Surgeon: Arta Silence, MD;  Location: [facility] ENDOSCOPY;  Service: Endoscopy;  Laterality: N/A;   . TONSILLECTOMY  1940's  . US ECHOCARDIOGRAPHY  04/09/2010   EF 60-65%     A IV Location/Drains/Wounds Patient Lines/Drains/Airways Status   Active Line/Drains/Airways    Name:   Placement date:   Placement time:   Site:   Days:   Peripheral IV 04/27/2018 Right Antecubital   05/12/2018    1216    Antecubital   less than 1   External Urinary Catheter   01/06/18    0944    -   136   Pressure Injury 12/31/17 Stage II -  Partial thickness loss of dermis presenting as a shallow open ulcer with a red, pink wound bed without slough. this is a full thickness wound; NOT a pressure injury   12/31/17    1448     142   Pressure Injury 12/31/17 Unstageable - Full thickness tissue loss in which the base of the ulcer is covered by slough (yellow, tan, gray, green or brown) and/or eschar (tan, brown or black) in the wound bed. 2 areas of unstageable pressure injuries   12/31/17    1451     142   Pressure Injury 12/31/17 Stage III -  Full thickness tissue loss. Subcutaneous fat may be visible but bone, tendon or muscle are NOT exposed. this is a full thickness wound, NOT a pressure injury   12/31/17    1452     142   Pressure Injury 12/31/17 Stage II -  Partial thickness loss of dermis presenting as a shallow open ulcer with a red, pink wound bed without slough. these are partial thickness wounds, NOT pressure injuries   12/31/17    1453     142          Intake/Output Last 24 hours No intake or output data in the 24 hours ending 05/07/2018 1700  Labs/Imaging Results for orders placed or performed during the hospital encounter of 05/04/2018 (from the past 48 hour(s))  Lactic acid, plasma     Status: None   Collection Time: 04/30/2018 12:08 PM  Result Value Ref Range   Lactic Acid, Venous 1.9 0.5 - 1.9 mmol/L    Comment: Performed at [facility], [place] [address] [place], [place] [address]  Comprehensive metabolic panel     Status: Abnormal   Collection Time: 05/21/2018 12:08 PM  Result Value Ref Range    Sodium 139 135 - 145 mmol/L   Potassium 3.8 3.5 - 5.1 mmol/L   Chloride 102 98 - 111 mmol/L   CO2 26 22 - 32 mmol/L   Glucose, Bld 86 70 - 99 mg/dL   BUN 18 8 - 23 mg/dL   Creatinine, Ser 1.34 (H) 0.61 - 1.24 mg/dL   Calcium 8.7 (L) 8.9 - 10.3 mg/dL   Total Protein 5.8 (L) 6.5 - 8.1 g/dL   Albumin 2.8 (L) 3.5 - 5.0 g/dL   AST 20 15 - 41 U/L   ALT 21 0 - 44 U/L   Alkaline Phosphatase  76 38 - 126 U/L   Total Bilirubin 0.8 0.3 - 1.2 mg/dL   GFR calc non Af Amer 51 (L) >60 mL/min   GFR calc Af Amer 59 (L) >60 mL/min   Anion gap 11 5 - 15    Comment: Performed at [facility] [address], [place], [place] [address]  CBC WITH DIFFERENTIAL     Status: Abnormal   Collection Time: 05/11/2018 12:08 PM  Result Value Ref Range   WBC 11.7 (H) 4.0 - 10.5 K/uL   RBC 5.08 4.22 - 5.81 MIL/uL   Hemoglobin 14.7 13.0 - 17.0 g/dL   HCT 46.3 39.0 - 52.0 %   MCV 91.1 80.0 - 100.0 fL   MCH 28.9 26.0 - 34.0 pg   MCHC 31.7 30.0 - 36.0 g/dL   RDW 17.0 (H) 11.5 - 15.5 %   Platelets 229 150 - 400 K/uL   nRBC 0.0 0.0 - 0.2 %   Neutrophils Relative % 70 %   Neutro Abs 8.4 (H) 1.7 - 7.7 K/uL   Lymphocytes Relative 13 %   Lymphs Abs 1.5 0.7 - 4.0 K/uL   Monocytes Relative 12 %   Monocytes Absolute 1.4 (H) 0.1 - 1.0 K/uL   Eosinophils Relative 3 %   Eosinophils Absolute 0.3 0.0 - 0.5 K/uL   Basophils Relative 1 %   Basophils Absolute 0.1 0.0 - 0.1 K/uL   Immature Granulocytes 1 %   Abs Immature Granulocytes 0.08 (H) 0.00 - 0.07 K/uL    Comment: Performed at [facility], [address], [place], [place] [address]  Protime-INR     Status: Abnormal   Collection Time: 05/12/2018 12:08 PM  Result Value Ref Range   Prothrombin Time 27.7 (H) 11.4 - 15.2 seconds   INR 2.6 (H) 0.8 - 1.2    Comment: (NOTE) INR goal varies based on device and disease states. Performed at [facility], [place] [address], [place], [place] [address]   APTT     Status: Abnormal   Collection Time: 05/23/2018 12:08  PM  Result Value Ref Range   aPTT 41 (H) 24 - 36 seconds    Comment:        IF BASELINE aPTT IS ELEVATED, SUGGEST PATIENT RISK ASSESSMENT BE USED TO DETERMINE APPROPRIATE ANTICOAGULANT THERAPY. Performed at [facility], [place] [address] [place], [place] [address]   D-dimer, quantitative     Status: Abnormal   Collection Time: 05/17/2018 12:08 PM  Result Value Ref Range   D-Dimer, Quant 1.24 (H) 0.00 - 0.50 ug/mL-FEU    Comment: (NOTE) At the manufacturer cut-off of 0.50 ug/mL FEU, this assay has been documented to exclude PE with a sensitivity and negative predictive value of 97 to 99%.  At this time, this assay has not been approved by the FDA to exclude DVT/VTE. Results should be correlated with clinical presentation. Performed at [facility], [place] [address] [place], [place] [address]   Procalcitonin     Status: None   Collection Time: 05/02/2018 12:08 PM  Result Value Ref Range   Procalcitonin 0.15 ng/mL    Comment:        Interpretation: PCT (Procalcitonin) <= 0.5 ng/mL: Systemic infection (sepsis) is not likely. Local bacterial infection is possible. (NOTE)       Sepsis PCT Algorithm           Lower Respiratory Tract  Infection PCT Algorithm    ----------------------------     ----------------------------         PCT < 0.25 ng/mL                PCT < 0.10 ng/mL         Strongly encourage             Strongly discourage   discontinuation of antibiotics    initiation of antibiotics    ----------------------------     -----------------------------       PCT 0.25 - 0.50 ng/mL            PCT 0.10 - 0.25 ng/mL               OR       >80% decrease in PCT            Discourage initiation of                                            antibiotics      Encourage discontinuation           of antibiotics    ----------------------------     -----------------------------         PCT >= 0.50 ng/mL              PCT 0.26 - 0.50 ng/mL                AND        <80% decrease in PCT             Encourage initiation of                                             antibiotics       Encourage continuation           of antibiotics    ----------------------------     -----------------------------        PCT >= 0.50 ng/mL                  PCT > 0.50 ng/mL               AND         increase in PCT                  Strongly encourage                                      initiation of antibiotics    Strongly encourage escalation           of antibiotics                                     -----------------------------                                           PCT <= 0.25 ng/mL  OR                                        > 80% decrease in PCT                                     Discontinue / Do not initiate                                             antibiotics Performed at [facility], [place] [address] [place], [place] [address]   Lactate dehydrogenase     Status: None   Collection Time: 05/13/2018 12:08 PM  Result Value Ref Range   LDH 175 98 - 192 U/L    Comment: Performed at [facility], [place] [address] [place], [place] [address]  Ferritin     Status: None   Collection Time: 05/04/2018 12:08 PM  Result Value Ref Range   Ferritin 49 24 - 336 ng/mL    Comment: Performed at [facility] [address] [place], [place] [address]  Fibrinogen     Status: Abnormal   Collection Time: 05/20/2018 12:08 PM  Result Value Ref Range   Fibrinogen 486 (H) 210 - 475 mg/dL    Comment: Performed at [facility] [address] [place], [place] [address]  C-reactive protein     Status: Abnormal   Collection Time: 05/08/2018 12:08 PM  Result Value Ref Range   CRP 5.2 (H) <1.0 mg/dL    Comment: Performed at [facility], [place] [address], [place],  [address]  SARS Coronavirus 2 [facility] order, Performed in [facility] hospital lab)     Status: Abnormal    Collection Time: 05/04/2018 12:10 PM  Result Value Ref Range   SARS Coronavirus 2 POSITIVE (A) NEGATIVE    Comment: RESULT CALLED TO, READ BACK BY AND VERIFIED WITH: RN Lowell Bouton [id number] (NOTE) If result is NEGATIVE SARS-CoV-2 target nucleic acids are NOT DETECTED. The SARS-CoV-2 RNA is generally detectable in upper and lower  respiratory specimens during the acute phase of infection. The lowest  concentration of SARS-CoV-2 viral copies this assay can detect is 250  copies / mL. A negative result does not preclude SARS-CoV-2 infection  and should not be used as the sole basis for treatment or other  patient management decisions.  A negative result may occur with  improper specimen collection / handling, submission of specimen other  than nasopharyngeal swab, presence of viral mutation(s) within the  areas targeted by this assay, and inadequate number of viral copies  (<250 copies / mL). A negative result must be combined with clinical  observations, patient history, and epidemiological information. If result is POSITIVE SARS-CoV-2 target nucleic acids are DETECTED. The SARS-Co V-2 RNA is generally detectable in upper and lower  respiratory specimens during the acute phase of infection.  Positive  results are indicative of active infection with SARS-CoV-2.  Clinical  correlation with patient history and other diagnostic information is  necessary to determine patient infection status.  Positive results do  not rule out bacterial infection or co-infection with other viruses. If result is PRESUMPTIVE POSTIVE SARS-CoV-2  nucleic acids MAY BE PRESENT.   A presumptive positive result was obtained on the submitted specimen  and confirmed on repeat testing.  While 2019 novel coronavirus  (SARS-CoV-2) nucleic acids may be present in the submitted sample  additional confirmatory testing may be necessary for epidemiological  and / or clinical management purposes  to differentiate between   SARS-CoV-2 and other Sarbecovirus currently known to infect humans.  If clinically indicated additional testing with an alternate test  methodology [phone]) is advised.  The SARS-CoV-2 RNA is generally  detectable in upper and lower respiratory specimens during the acute  phase of infection. The expected result is Negative. Fact Sheet for Patients:  StrictlyIdeas.no Fact Sheet for Healthcare Providers: BankingDealers.co.za This test is not yet approved or cleared by the [place] FDA and has been authorized for detection and/or diagnosis of SARS-CoV-2 by FDA under an Emergency Use Authorization (EUA).  This EUA will remain in effect (meaning this test can be used) for the duration of the COVID-19 declaration under Section 564(b)(1) of the Act, 21 U.S.C. section 360bbb-3(b)(1), unless the authorization is terminated or revoked sooner. Performed at [facility], [place] [address], [place], [place] [address]   Triglycerides     Status: None   Collection Time: 05/12/2018 12:10 PM  Result Value Ref Range   Triglycerides 105 <150 mg/dL    Comment: Performed at [facility] [address], [place], [place] [address]  I-stat Creatinine, ED     Status: None   Collection Time: 05/24/2018 12:36 PM  Result Value Ref Range   Creatinine, Ser 1.20 0.61 - 1.24 mg/dL   Ct Angio Head W Or Wo Contrast  Result Date: 05/04/2018 CLINICAL DATA:  RIGHT-sided weakness. Altered mental status. Facial droop. EXAM: CT ANGIOGRAPHY HEAD AND NECK TECHNIQUE: Multidetector CT imaging of the head and neck was performed using the standard protocol during bolus administration of intravenous contrast. Multiplanar CT image reconstructions and MIPs were obtained to evaluate the vascular anatomy. Carotid stenosis measurements (when applicable) are obtained utilizing NASCET criteria, using the distal internal carotid diameter as the denominator. CONTRAST:  25mL  OMNIPAQUE IOHEXOL 350 MG/ML SOLN COMPARISON:  Code stroke CT earlier in the day. FINDINGS: CTA NECK FINDINGS Aortic arch: Standard branching. Imaged portion shows no evidence of aneurysm or dissection. No significant stenosis of the major arch vessel origins. Aortic atherosclerosis. Right carotid system: No evidence of dissection, stenosis (50% or greater) or occlusion. Moderate calcific and soft plaque at the bifurcation. Left carotid system: Slightly greater than 50% irregular stenosis, calcific and soft plaque, at the RIGHT internal carotid artery origin. This is based on luminal measurements of 1.9/4.1 proximal/distal. No dissection. No intraluminal thrombus. Vertebral arteries: Nonstenotic calcific plaque at both vertebral origins. Both vessels are patent through the neck, codominant. Skeleton: Spondylosis. No worrisome osseous lesion. Other neck: No neck masses. Upper chest: Incompletely visualized cloudlike airspace opacity RIGHT upper lobe posterior segment. Small RIGHT pleural effusion. Review of the MIP images confirms the above findings CTA HEAD FINDINGS Anterior circulation: No significant stenosis, proximal occlusion, aneurysm, or vascular malformation. Nonstenotic atheromatous change both carotid siphons. No M1 or M2 disease of significance. Dominant RIGHT A1 ACA. Posterior circulation: No significant stenosis, proximal occlusion, aneurysm, or vascular malformation. Venous sinuses: As permitted by contrast timing, patent. Anatomic variants: None of significance. Delayed phase: Not performed. Review of the MIP images confirms the above findings IMPRESSION: 1. Slightly greater than 50% stenosis, calcific and soft plaque at the LEFT internal carotid  artery origin. This is non flow reducing. 2. No intracranial stenosis or vascular occlusion. 3. Incompletely visualized cloudy airspace opacity RIGHT upper lobe posterior segment, which could represent pneumonia. Small RIGHT pleural effusion. 4. These results  were communicated to Dr. Lorraine Lax at [facility] 04/12/2020by text page via the [facility] messaging system. Electronically Signed   By: Staci Righter M.D.   On: 05/14/2018 13:20   Ct Angio Neck W Or Wo Contrast  Result Date: 05/16/2018 CLINICAL DATA:  RIGHT-sided weakness. Altered mental status. Facial droop. EXAM: CT ANGIOGRAPHY HEAD AND NECK TECHNIQUE: Multidetector CT imaging of the head and neck was performed using the standard protocol during bolus administration of intravenous contrast. Multiplanar CT image reconstructions and MIPs were obtained to evaluate the vascular anatomy. Carotid stenosis measurements (when applicable) are obtained utilizing NASCET criteria, using the distal internal carotid diameter as the denominator. CONTRAST:  30mL OMNIPAQUE IOHEXOL 350 MG/ML SOLN COMPARISON:  Code stroke CT earlier in the day. FINDINGS: CTA NECK FINDINGS Aortic arch: Standard branching. Imaged portion shows no evidence of aneurysm or dissection. No significant stenosis of the major arch vessel origins. Aortic atherosclerosis. Right carotid system: No evidence of dissection, stenosis (50% or greater) or occlusion. Moderate calcific and soft plaque at the bifurcation. Left carotid system: Slightly greater than 50% irregular stenosis, calcific and soft plaque, at the RIGHT internal carotid artery origin. This is based on luminal measurements of 1.9/4.1 proximal/distal. No dissection. No intraluminal thrombus. Vertebral arteries: Nonstenotic calcific plaque at both vertebral origins. Both vessels are patent through the neck, codominant. Skeleton: Spondylosis. No worrisome osseous lesion. Other neck: No neck masses. Upper chest: Incompletely visualized cloudlike airspace opacity RIGHT upper lobe posterior segment. Small RIGHT pleural effusion. Review of the MIP images confirms the above findings CTA HEAD FINDINGS Anterior circulation: No significant stenosis, proximal occlusion, aneurysm, or vascular malformation. Nonstenotic  atheromatous change both carotid siphons. No M1 or M2 disease of significance. Dominant RIGHT A1 ACA. Posterior circulation: No significant stenosis, proximal occlusion, aneurysm, or vascular malformation. Venous sinuses: As permitted by contrast timing, patent. Anatomic variants: None of significance. Delayed phase: Not performed. Review of the MIP images confirms the above findings IMPRESSION: 1. Slightly greater than 50% stenosis, calcific and soft plaque at the LEFT internal carotid artery origin. This is non flow reducing. 2. No intracranial stenosis or vascular occlusion. 3. Incompletely visualized cloudy airspace opacity RIGHT upper lobe posterior segment, which could represent pneumonia. Small RIGHT pleural effusion. 4. These results were communicated to Dr. Lorraine Lax at [facility] 04/09/2020by text page via the [facility] messaging system. Electronically Signed   By: Staci Righter M.D.   On: 05/17/2018 13:20   Dg Chest Portable 1 View  Result Date: 04/28/2018 CLINICAL DATA:  78 year old male with altered mental status, lethargic, wheezing EXAM: PORTABLE CHEST 1 VIEW COMPARISON:  Prior chest x-ray 12/31/2017 FINDINGS: Lower inspiratory volumes with minimal bibasilar atelectasis. No evidence of pulmonary edema, focal airspace consolidation, pleural effusion or pneumothorax. Stable cardiac and mediastinal contours. Atherosclerotic calcifications visualized in the transverse aorta. No acute osseous abnormality. Surgical clips in the right upper quadrant suggest prior cholecystectomy. IMPRESSION: Low inspiratory volumes with bibasilar atelectasis. Otherwise, no acute cardiopulmonary process. Electronically Signed   By: Jacqulynn Cadet M.D.   On: 05/22/2018 13:33   Ct Head Code Stroke Wo Contrast  Result Date: 05/22/2018 CLINICAL DATA:  Code stroke. RIGHT-sided weakness, altered mental status with facial droop. EXAM: CT HEAD WITHOUT CONTRAST TECHNIQUE: Contiguous axial images were obtained from the base of the  skull through the vertex without intravenous contrast. COMPARISON:  04/22/2017. FINDINGS: Brain: No evidence for acute infarction, hemorrhage, mass lesion, hydrocephalus, or extra-axial fluid. Generalized atrophy. T2 and FLAIR hyperintensities throughout the white matter, consistent with small vessel disease. Vascular: Calcification of the cavernous internal carotid arteries consistent with cerebrovascular atherosclerotic disease. No signs of intracranial large vessel occlusion. Skull: Normal. Negative for fracture or focal lesion. Sinuses/Orbits: Chronic RIGHT maxillary sinusitis. Negative orbits. Other: None. ASPECTS [facility] Stroke Program Early CT Score) - Ganglionic level infarction (caudate, lentiform nuclei, internal capsule, insula, M1-M3 cortex): 7 - Supraganglionic infarction (M4-M6 cortex): 3 Total score (0-10 with 10 being normal): 10 IMPRESSION: 1. Atrophy and small vessel disease. No acute intracranial findings. 2. ASPECTS is 10. 3. These results were communicated to Dr. Lorraine Lax at 1:07 pmon 04/21/2020by text page via the [facility] messaging system. * Electronically Signed   By: Staci Righter M.D.   On: 04/30/2018 13:09    Pending Labs Unresulted Labs (From admission, onward)    Start     Ordered   05/14/2018 1220  Ethanol  ONCE - STAT,   STAT     05/15/2018 1221   04/29/2018 1220  Urine rapid drug screen (hosp performed)  ONCE - STAT,   STAT     05/18/2018 1221   05/14/2018 1208  Lactic acid, plasma  Now then every 2 hours,   STAT     05/18/2018 1208   04/26/2018 1208  Blood Culture (routine x 2)  BLOOD CULTURE X 2,   STAT     05/12/2018 1208   05/10/2018 1208  Urinalysis, Routine w reflex microscopic  ONCE - STAT,   STAT     05/11/2018 1208          Vitals/Pain Today's Vitals   05/15/2018 1615 05/08/2018 1630 05/05/2018 1630 05/01/2018 1645  BP: 121/67 (!) 101/59  103/63  Pulse: 88 87  88  Resp: 18 16  18   Temp:      TempSrc:      SpO2: 97% 95%  96%  Weight:      Height:      PainSc:   0-No pain      Isolation Precautions No active isolations  Medications Medications  ceFEPIme (MAXIPIME) 2 g in sodium chloride 0.9 % 100 mL IVPB (has no administration in time range)  vancomycin (VANCOCIN) 1,250 mg in sodium chloride 0.9 % 250 mL IVPB (has no administration in time range)  ceFEPIme (MAXIPIME) 2 g in sodium chloride 0.9 % 100 mL IVPB (0 g Intravenous Stopped 05/02/2018 1436)  metroNIDAZOLE (FLAGYL) IVPB 500 mg (0 mg Intravenous Stopped 05/16/2018 1452)  acetaminophen (TYLENOL) tablet 650 mg (650 mg Oral Given 04/28/2018 1336)  vancomycin (VANCOCIN) 1,500 mg in sodium chloride 0.9 % 500 mL IVPB (0 mg Intravenous Stopped 05/10/2018 1603)  hydrocortisone sodium succinate (SOLU-CORTEF) 100 MG injection 100 mg (100 mg Intravenous Given 05/11/2018 1335)  iohexol (OMNIPAQUE) 350 MG/ML injection 75 mL (75 mLs Intravenous Contrast Given 05/09/2018 1257)  sodium chloride 0.9 % bolus 1,000 mL (0 mLs Intravenous Stopped 05/08/2018 1604)  sodium chloride 0.9 % bolus 1,000 mL (1,000 mLs Intravenous New Bag/Given 05/22/18 1603)    Mobility walks with device Low fall risk   Focused Assessments Neuro Assessment Handoff:  Swallow screen pass? No  Cardiac Rhythm: Sinus tachycardia NIH Stroke Scale ( + Modified Stroke Scale Criteria)  Interval: Initial Level of Consciousness (1a.)   : Not alert, but arousable by minor stimulation to obey, answer, or respond LOC  Questions (1b. )   +: Answers both questions correctly LOC Commands (1c. )   + : Performs both tasks correctly Best Gaze (2. )  +: Normal Visual (3. )  +: No visual loss Facial Palsy (4. )    : Minor paralysis Motor Arm, Left (5a. )   +: No drift Motor Arm, Right (5b. )   +: No drift Motor Leg, Left (6a. )   +: No movement Motor Leg, Right (6b. )   +: Drift Limb Ataxia (7. ): Absent Sensory (8. )   +: Normal, no sensory loss Best Language (9. )   +: No aphasia Dysarthria (10. ): Mild-to-moderate dysarthria, patient slurs at least some words and, at  worst, can be understood with some difficulty Extinction/Inattention (11.)   +: No Abnormality Modified SS Total  +: 5 Complete NIHSS TOTAL: 12 Last date known well: 05/21/18 Last time known well: 2200 Neuro Assessment: Exceptions to WDL Neuro Checks:   Initial (05/18/2018 1229)  Last Documented NIHSS Modified Score: 5 (05/03/2018 1450) Has TPA been given? No If patient is a Neuro Trauma and patient is going to OR before floor call report to [place] nurse: [phone] or [phone]     R Recommendations: See Admitting Provider Note  Report given to:   Additional Notes: .

## 2018-05-22 NOTE — Progress Notes (Signed)
[place] for warfarin Indication: VTE history   Assessment: 38 yom on warfarin PTA for PE/DVT history presenting with AMS, sepsis due to COVID positive. CVA work-up initiated. Pharmacy consulted to dose warfarin inpatient. INR therapeutic at 2.6 on admit. Noted remote hx of GIB, but no active bleed issues documented. CBC wnl, d-dimer 1.24 on admit.  Noted, patient received 1x dose of Flagyl in the ER - will likely cause INR to bump due to DDI with warfarin. Flagyl order has not been continued at this time.  Spoke with Dr. Tamala Julian. He is aware of Neuro note that states to hold warfarin until after MRI completion but would like Pharmacy to start warfarin now  PTA warfarin dose: 5mg  daily except 2.5mg  on Tues/Fri (last dose 4/26 PTA)  Goal of Therapy:  INR 2-3 Monitor platelets by anticoagulation protocol: Yes   Plan:  Warfarin 5mg  PO x 1 dose - watch closely after receiving Flagyl in the ER Daily INR Monitor CBC, s/sx bleeding  Elicia Lamp, PharmD, BCPS Clinical Pharmacist 05/16/2018 5:36 PM

## 2018-05-22 NOTE — ED Notes (Signed)
The patients wife has been given an update for admission and floor. She was very Patent attorney.

## 2018-05-22 NOTE — Consult Note (Addendum)
Requesting Physician: Tivis Ringer PA- C    Chief Complaint: Altered mental status, right-sided weakness  History obtained from: Patient and Chart    HPI:                                                                                                                                       [name] is an 78 y.o. male with past medical history of dementia, chronic diastolic heart failure, CKD, coronary artery disease, hypertension, obesity, PE on Coumadin presents today emergency room with altered mental status brought in from nursing home with multiple COVID positive patients.  When assessed by EDP patient noted to have right-sided weakness and stroke alert was called.  Last known normal was last night, today morning nursing staff noticed patient was not talking.  Stat head CT head was obtained which showed no hemorrhage. He was outside the window for IV TPA.  CT angiogram was obtained which showed no large vessel occlusion. Code Sepsis also initiated as patient hypotensive.  SARS-CoV-2 resulted positive.  Date last known well: 4.26.20 Time last known well: Last night tPA Given: Outside 4.5-hour window NIHSS: 12 Baseline MRS 3   Past Medical History:  Diagnosis Date  . Acute lower GI bleeding    a. admitted to [facility] 04/11/2013;  b. 04/2014 EGD: 1.  gastric erosions, mild prox gastritis and bulbar duodenitis->Protonix.  . Addison's disease ([place])   . Anemia   . Anemia in CKD (chronic kidney disease) 06/25/2015  . Chronic back pain   . Chronic diastolic CHF (congestive heart failure) ([place])    a. His initial ejection fraction was between 30 and 35%;  b. 04/2013 Echo: EF 50-55% Gr1 DD, mild-mod MR;  c. 04/2014 Echo: EF 60-65%, Gr 1 DD, triv TR.  . CKD (chronic kidney disease), stage III ([place])   . Coronary artery disease    a. 07/2014 - s/p difficult PCI - had pressure wire analysis of LAD s/p DES in mid segment c/b wire-induced dissection in the distal segment of the LAD which was treated with  repeated balloon inflations. Also had DES to rPDA - again had either spasm distal to the stent placement or an edge dissection but the vessel was small in that area; procedure aborted.  . Daily headache   . GERD (gastroesophageal reflux disease)   . H/O hiatal hernia   . Hepatitis    a. 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  . History of aortic insufficiency   . Hx of cardiovascular stress test    a. ETT-Myoview 7/14:  Low risk, inferior defect consistent with thinning, no ischemia, normal wall motion, EF 54%  . Hypertension   . Hypogonadism male   . Obesity   . Peptic ulcer disease   . Pulmonary embolism ([place])    a. 04/2014 CTA: Bilat submassive PE ->coumadin.  . Pulmonary HTN ([place])   . Thyroid disease  Past Surgical History:  Procedure Laterality Date  . APPENDECTOMY  1950's  . CARDIAC CATHETERIZATION  06/26/2008   EF 30-35%  . CARDIAC CATHETERIZATION N/A 08/20/2014   Procedure: Right/Left Heart Cath and Coronary Angiography;  Surgeon: Jolaine Artist, MD;  [facility];  Service: Cardiovascular;  Laterality: N/A;  . CARDIAC CATHETERIZATION N/A 08/21/2014   Procedure: Coronary Stent Intervention;  Surgeon: Wellington Hampshire, MD;  Location: [facility];  Service: Cardiovascular;  Laterality: N/A;  . CARDIAC CATHETERIZATION N/A 11/24/2015   Procedure: Right/Left Heart Cath and Coronary Angiography;  Surgeon: Jolaine Artist, MD;  Location: [facility];  Service: Cardiovascular;  Laterality: N/A;  . CHOLECYSTECTOMY  ~ 1965  . ESOPHAGOGASTRODUODENOSCOPY N/A 04/11/2013   Procedure: ESOPHAGOGASTRODUODENOSCOPY (EGD);  Surgeon: Missy Sabins, MD;  Location: [facility] ENDOSCOPY;  Service: Endoscopy;  Laterality: N/A;  . ESOPHAGOGASTRODUODENOSCOPY N/A 05/13/2014   Procedure: ESOPHAGOGASTRODUODENOSCOPY (EGD);  Surgeon: Arta Silence, MD;  Location: [facility] ENDOSCOPY;  Service: Endoscopy;  Laterality: N/A;  . TONSILLECTOMY  1940's  . US ECHOCARDIOGRAPHY  04/09/2010   EF 60-65%    Family  History  Problem Relation Age of Onset  . Clotting disorder Mother   . Clotting disorder Brother   . Clotting disorder Brother   . Clotting disorder Brother   . Clotting disorder Other        Niece.  Marland Kitchen Heart attack Neg Hx    Social History:  reports that he has never smoked. He has never used smokeless tobacco. He reports that he does not drink alcohol or use drugs.  Allergies: No Known Allergies  Medications:                                                                                                                        I reviewed home medications   ROS:                                                                                                                                     Unable to obtain due to altered mental status   Examination:  General: Appears well-developed . Psych: Affect appropriate to situation Eyes: No scleral injection HENT: No OP obstrucion Head: Normocephalic.  Cardiovascular: Normal rate and regular rhythm.  Respiratory: Effort normal  GI: Soft.  No distension. Skin: WDI    Neurological Examination (limited exam, performed by nurse-assist looking through the window to minimize PPE and reduce exposure as high suspicion for COVID 19)  Mental Status: Alert, oriented to himself.  Mild aphasia and dysarthria.  Follows commands intermittently. Cranial Nerves: II: Visual fields : Difficult to assess III,IV, VI: External ocular movements intact no gaze deviation  V,VII: Mild right-sided facial droop VIII: hearing normal bilaterally IX,X: uvula rises symmetrically XI: bilateral shoulder shrug XII: midline tongue extension Motor: Right : Upper extremity   3/5    Left:     Upper extremity   4/5  Lower extremity   2/5     Lower extremity   2/5 Tone and bulk:normal tone throughout; no atrophy noted Sensory: llight touch intact throughout,  bilaterally Deep Tendon Reflexes : not assessed Plantars: Right: downgoing   Left: downgoing Cerebellar: No gross ataxia Gait: not assessed   Lab Results: Basic Metabolic Panel: Recent Labs  Lab 05/01/2018 1208 05/23/2018 1236  NA 139  --   K 3.8  --   CL 102  --   CO2 26  --   GLUCOSE 86  --   BUN 18  --   CREATININE 1.34* 1.20  CALCIUM 8.7*  --     CBC: Recent Labs  Lab 05/06/2018 1208  WBC 11.7*  NEUTROABS 8.4*  HGB 14.7  HCT 46.3  MCV 91.1  PLT 229    Coagulation Studies: Recent Labs    05/21/2018 1208  LABPROT 27.7*  INR 2.6*    Imaging: Ct Angio Head W Or Wo Contrast  Result Date: 05/15/2018 CLINICAL DATA:  RIGHT-sided weakness. Altered mental status. Facial droop. EXAM: CT ANGIOGRAPHY HEAD AND NECK TECHNIQUE: Multidetector CT imaging of the head and neck was performed using the standard protocol during bolus administration of intravenous contrast. Multiplanar CT image reconstructions and MIPs were obtained to evaluate the vascular anatomy. Carotid stenosis measurements (when applicable) are obtained utilizing NASCET criteria, using the distal internal carotid diameter as the denominator. CONTRAST:  54mL OMNIPAQUE IOHEXOL 350 MG/ML SOLN COMPARISON:  Code stroke CT earlier in the day. FINDINGS: CTA NECK FINDINGS Aortic arch: Standard branching. Imaged portion shows no evidence of aneurysm or dissection. No significant stenosis of the major arch vessel origins. Aortic atherosclerosis. Right carotid system: No evidence of dissection, stenosis (50% or greater) or occlusion. Moderate calcific and soft plaque at the bifurcation. Left carotid system: Slightly greater than 50% irregular stenosis, calcific and soft plaque, at the RIGHT internal carotid artery origin. This is based on luminal measurements of 1.9/4.1 proximal/distal. No dissection. No intraluminal thrombus. Vertebral arteries: Nonstenotic calcific plaque at both vertebral origins. Both vessels are patent through  the neck, codominant. Skeleton: Spondylosis. No worrisome osseous lesion. Other neck: No neck masses. Upper chest: Incompletely visualized cloudlike airspace opacity RIGHT upper lobe posterior segment. Small RIGHT pleural effusion. Review of the MIP images confirms the above findings CTA HEAD FINDINGS Anterior circulation: No significant stenosis, proximal occlusion, aneurysm, or vascular malformation. Nonstenotic atheromatous change both carotid siphons. No M1 or M2 disease of significance. Dominant RIGHT A1 ACA. Posterior circulation: No significant stenosis, proximal occlusion, aneurysm, or vascular malformation. Venous sinuses: As permitted by contrast timing, patent. Anatomic variants: None of significance. Delayed phase: Not performed. Review of the MIP images  confirms the above findings IMPRESSION: 1. Slightly greater than 50% stenosis, calcific and soft plaque at the LEFT internal carotid artery origin. This is non flow reducing. 2. No intracranial stenosis or vascular occlusion. 3. Incompletely visualized cloudy airspace opacity RIGHT upper lobe posterior segment, which could represent pneumonia. Small RIGHT pleural effusion. 4. These results were communicated to Dr. Lorraine Lax at [place] 04/20/2020by text page via the [facility] messaging system. Electronically Signed   By: Staci Righter M.D.   On: 04/26/2018 13:20   Ct Angio Neck W Or Wo Contrast  Result Date: 05/08/2018 CLINICAL DATA:  RIGHT-sided weakness. Altered mental status. Facial droop. EXAM: CT ANGIOGRAPHY HEAD AND NECK TECHNIQUE: Multidetector CT imaging of the head and neck was performed using the standard protocol during bolus administration of intravenous contrast. Multiplanar CT image reconstructions and MIPs were obtained to evaluate the vascular anatomy. Carotid stenosis measurements (when applicable) are obtained utilizing NASCET criteria, using the distal internal carotid diameter as the denominator. CONTRAST:  78mL OMNIPAQUE IOHEXOL 350  MG/ML SOLN COMPARISON:  Code stroke CT earlier in the day. FINDINGS: CTA NECK FINDINGS Aortic arch: Standard branching. Imaged portion shows no evidence of aneurysm or dissection. No significant stenosis of the major arch vessel origins. Aortic atherosclerosis. Right carotid system: No evidence of dissection, stenosis (50% or greater) or occlusion. Moderate calcific and soft plaque at the bifurcation. Left carotid system: Slightly greater than 50% irregular stenosis, calcific and soft plaque, at the RIGHT internal carotid artery origin. This is based on luminal measurements of 1.9/4.1 proximal/distal. No dissection. No intraluminal thrombus. Vertebral arteries: Nonstenotic calcific plaque at both vertebral origins. Both vessels are patent through the neck, codominant. Skeleton: Spondylosis. No worrisome osseous lesion. Other neck: No neck masses. Upper chest: Incompletely visualized cloudlike airspace opacity RIGHT upper lobe posterior segment. Small RIGHT pleural effusion. Review of the MIP images confirms the above findings CTA HEAD FINDINGS Anterior circulation: No significant stenosis, proximal occlusion, aneurysm, or vascular malformation. Nonstenotic atheromatous change both carotid siphons. No M1 or M2 disease of significance. Dominant RIGHT A1 ACA. Posterior circulation: No significant stenosis, proximal occlusion, aneurysm, or vascular malformation. Venous sinuses: As permitted by contrast timing, patent. Anatomic variants: None of significance. Delayed phase: Not performed. Review of the MIP images confirms the above findings IMPRESSION: 1. Slightly greater than 50% stenosis, calcific and soft plaque at the LEFT internal carotid artery origin. This is non flow reducing. 2. No intracranial stenosis or vascular occlusion. 3. Incompletely visualized cloudy airspace opacity RIGHT upper lobe posterior segment, which could represent pneumonia. Small RIGHT pleural effusion. 4. These results were communicated to  Dr. Lorraine Lax at [place] 04/24/2020by text page via the [facility] messaging system. Electronically Signed   By: Staci Righter M.D.   On: 05/16/2018 13:20   Dg Chest Portable 1 View  Result Date: 05/20/2018 CLINICAL DATA:  78 year old male with altered mental status, lethargic, wheezing EXAM: PORTABLE CHEST 1 VIEW COMPARISON:  Prior chest x-ray 12/31/2017 FINDINGS: Lower inspiratory volumes with minimal bibasilar atelectasis. No evidence of pulmonary edema, focal airspace consolidation, pleural effusion or pneumothorax. Stable cardiac and mediastinal contours. Atherosclerotic calcifications visualized in the transverse aorta. No acute osseous abnormality. Surgical clips in the right upper quadrant suggest prior cholecystectomy. IMPRESSION: Low inspiratory volumes with bibasilar atelectasis. Otherwise, no acute cardiopulmonary process. Electronically Signed   By: Jacqulynn Cadet M.D.   On: 05/22/2018 13:33   Ct Head Code Stroke Wo Contrast  Result Date: 05/22/2018 CLINICAL DATA:  Code stroke. RIGHT-sided weakness, altered mental status  with facial droop. EXAM: CT HEAD WITHOUT CONTRAST TECHNIQUE: Contiguous axial images were obtained from the base of the skull through the vertex without intravenous contrast. COMPARISON:  04/22/2017. FINDINGS: Brain: No evidence for acute infarction, hemorrhage, mass lesion, hydrocephalus, or extra-axial fluid. Generalized atrophy. T2 and FLAIR hyperintensities throughout the white matter, consistent with small vessel disease. Vascular: Calcification of the cavernous internal carotid arteries consistent with cerebrovascular atherosclerotic disease. No signs of intracranial large vessel occlusion. Skull: Normal. Negative for fracture or focal lesion. Sinuses/Orbits: Chronic RIGHT maxillary sinusitis. Negative orbits. Other: None. ASPECTS [facility] Stroke Program Early CT Score) - Ganglionic level infarction (caudate, lentiform nuclei, internal capsule, insula, M1-M3 cortex): 7 -  Supraganglionic infarction (M4-M6 cortex): 3 Total score (0-10 with 10 being normal): 10 IMPRESSION: 1. Atrophy and small vessel disease. No acute intracranial findings. 2. ASPECTS is 10. 3. These results were communicated to Dr. Lorraine Lax at 1:07 pmon 04/10/2020by text page via the [facility] messaging system. * Electronically Signed   By: Staci Righter M.D.   On: 05/22/2018 13:09     ASSESSMENT AND PLAN   78 y.o. male with past medical history of dementia, chronic diastolic heart failure, CKD, coronary artery disease, hypertension, obesity, PE on Coumadin presented with acute altered mental status-noted to be septic with SARS-CoV-2 infection.  Also noted to have right-sided weakness.  CT head and CT angiogram was performed.  CT head did not show hemorrhage/acute findings and CT angiogram was negative for large vessel occlusion. Suspect patient has coagulated encephalopathy, acute stroke not LVO versus seizure/postictal state.   Encephalopathy in the setting of SARS-CoV-2/sepsis Possible acute ischemic stroke  Recommend # Routine EEG # MRI of the brain without contrast  #Transthoracic Echo  #Hold warfarin until MRI Brain in case patient has large infarct #Start or continue Atorvastatin 40 mg/other high intensity statin # BP goal: permissive HTN upto 185/110 mmHg # HBAIC and Lipid profile # Telemetry monitoring # Frequent neuro checks # NPO until passes stroke swallow screen  Please page stroke NP  Or  PA  Or MD from 8am -4 pm  as this patient from this time will be  followed by the stroke.   You can look them up on [email or web address]  Password [facility]    Jaziah Kwasnik [facility] Pager Number [phone]

## 2018-05-22 NOTE — ED Triage Notes (Signed)
Pt arrives via EMS from [facility] with altered mental status. Pt has hx of dementia. EMS reports that pt has been lethargic today not eating like normal. O2 90% on RA.

## 2018-05-22 NOTE — ED Notes (Signed)
Patient appears more alert, follows command, identifies objects

## 2018-05-23 ENCOUNTER — Inpatient Hospital Stay (HOSPITAL_COMMUNITY): Payer: Medicare HMO

## 2018-05-23 DIAGNOSIS — J988 Other specified respiratory disorders: Secondary | ICD-10-CM

## 2018-05-23 LAB — CBC WITH DIFFERENTIAL/PLATELET
Abs Immature Granulocytes: 0.06 10*3/uL (ref 0.00–0.07)
Basophils Absolute: 0 10*3/uL (ref 0.0–0.1)
Basophils Relative: 0 %
Eosinophils Absolute: 0 10*3/uL (ref 0.0–0.5)
Eosinophils Relative: 0 %
HCT: 42.1 % (ref 39.0–52.0)
Hemoglobin: 13.6 g/dL (ref 13.0–17.0)
Immature Granulocytes: 1 %
Lymphocytes Relative: 22 %
Lymphs Abs: 2.5 10*3/uL (ref 0.7–4.0)
MCH: 28.9 pg (ref 26.0–34.0)
MCHC: 32.3 g/dL (ref 30.0–36.0)
MCV: 89.6 fL (ref 80.0–100.0)
Monocytes Absolute: 1.5 10*3/uL — ABNORMAL HIGH (ref 0.1–1.0)
Monocytes Relative: 13 %
Neutro Abs: 7.1 10*3/uL (ref 1.7–7.7)
Neutrophils Relative %: 64 %
Platelets: 203 10*3/uL (ref 150–400)
RBC: 4.7 MIL/uL (ref 4.22–5.81)
RDW: 17.2 % — ABNORMAL HIGH (ref 11.5–15.5)
WBC: 11.3 10*3/uL — ABNORMAL HIGH (ref 4.0–10.5)
nRBC: 0 % (ref 0.0–0.2)

## 2018-05-23 LAB — COMPREHENSIVE METABOLIC PANEL
ALT: 22 U/L (ref 0–44)
AST: 20 U/L (ref 15–41)
Albumin: 2.5 g/dL — ABNORMAL LOW (ref 3.5–5.0)
Alkaline Phosphatase: 64 U/L (ref 38–126)
Anion gap: 6 (ref 5–15)
BUN: 18 mg/dL (ref 8–23)
CO2: 23 mmol/L (ref 22–32)
Calcium: 7.9 mg/dL — ABNORMAL LOW (ref 8.9–10.3)
Chloride: 110 mmol/L (ref 98–111)
Creatinine, Ser: 1.23 mg/dL (ref 0.61–1.24)
GFR calc Af Amer: >60 mL/min (ref >60)
GFR calc non Af Amer: 56 mL/min — ABNORMAL LOW (ref >60)
Glucose, Bld: 66 mg/dL — ABNORMAL LOW (ref 70–99)
Potassium: 3.9 mmol/L (ref 3.5–5.1)
Sodium: 139 mmol/L (ref 135–145)
Total Bilirubin: 0.7 mg/dL (ref 0.3–1.2)
Total Protein: 5.3 g/dL — ABNORMAL LOW (ref 6.5–8.1)

## 2018-05-23 LAB — FERRITIN: Ferritin: 85 ng/mL (ref 24–336)

## 2018-05-23 LAB — CK: Total CK: 62 U/L (ref 49–397)

## 2018-05-23 LAB — LIPID PANEL
Cholesterol: 125 mg/dL (ref 0–200)
HDL: 35 mg/dL — ABNORMAL LOW (ref >40)
LDL Cholesterol: 76 mg/dL (ref 0–99)
Total CHOL/HDL Ratio: 3.6 RATIO
Triglycerides: 71 mg/dL (ref <150)
VLDL: 14 mg/dL (ref 0–40)

## 2018-05-23 LAB — PHOSPHORUS: Phosphorus: 3.7 mg/dL (ref 2.5–4.6)

## 2018-05-23 LAB — GLUCOSE, CAPILLARY
Glucose-Capillary: 68 mg/dL — ABNORMAL LOW (ref 70–99)
Glucose-Capillary: 93 mg/dL (ref 70–99)
Glucose-Capillary: 79 mg/dL (ref 70–99)
Glucose-Capillary: 87 mg/dL (ref 70–99)
Glucose-Capillary: 143 mg/dL — ABNORMAL HIGH (ref 70–99)

## 2018-05-23 LAB — MRSA PCR SCREENING: MRSA by PCR: NEGATIVE

## 2018-05-23 LAB — HEMOGLOBIN A1C
Hgb A1c MFr Bld: 5.8 % — ABNORMAL HIGH (ref 4.8–5.6)
Mean Plasma Glucose: 119.76 mg/dL

## 2018-05-23 LAB — MAGNESIUM: Magnesium: 1.9 mg/dL (ref 1.7–2.4)

## 2018-05-23 LAB — D-DIMER, QUANTITATIVE: D-Dimer, Quant: 1.03 ug/mL-FEU — ABNORMAL HIGH (ref 0.00–0.50)

## 2018-05-23 LAB — TRIGLYCERIDES: Triglycerides: 70 mg/dL (ref <150)

## 2018-05-23 LAB — PROTIME-INR
INR: 2.5 — ABNORMAL HIGH (ref 0.8–1.2)
Prothrombin Time: 26.5 seconds — ABNORMAL HIGH (ref 11.4–15.2)

## 2018-05-23 LAB — C-REACTIVE PROTEIN: CRP: 12.2 mg/dL — ABNORMAL HIGH (ref <1.0)

## 2018-05-23 MED ORDER — CARVEDILOL 3.125 MG PO TABS
6.2500 mg | ORAL_TABLET | Freq: Two times a day (BID) | ORAL | Status: DC
  Administered 2018-05-23 – 2018-06-05 (×23): 6.25 mg via ORAL
  Filled 2018-05-23 (×22): qty 2
  Filled 2018-05-23: qty 1

## 2018-05-23 MED ORDER — WARFARIN SODIUM 2.5 MG PO TABS
2.5000 mg | ORAL_TABLET | Freq: Once | ORAL | Status: AC
  Administered 2018-05-23: 2.5 mg via ORAL
  Filled 2018-05-23: qty 1

## 2018-05-23 MED ORDER — SODIUM CHLORIDE 0.9 % IV SOLN
INTRAVENOUS | Status: DC | PRN
  Administered 2018-05-27: 250 mL via INTRAVENOUS
  Administered 2018-05-31: 08:00:00 1000 mL via INTRAVENOUS

## 2018-05-23 MED ORDER — AZITHROMYCIN 250 MG PO TABS
500.0000 mg | ORAL_TABLET | Freq: Every day | ORAL | Status: DC
  Administered 2018-05-23 – 2018-05-27 (×5): 500 mg via ORAL
  Filled 2018-05-23 (×5): qty 2

## 2018-05-23 MED ORDER — DIVALPROEX SODIUM 125 MG PO DR TAB
125.0000 mg | DELAYED_RELEASE_TABLET | Freq: Three times a day (TID) | ORAL | Status: DC
  Filled 2018-05-23 (×2): qty 1

## 2018-05-23 MED ORDER — VITAMIN D 25 MCG (1000 UNIT) PO TABS
1000.0000 [IU] | ORAL_TABLET | Freq: Every day | ORAL | Status: DC
  Administered 2018-05-24 – 2018-06-05 (×12): 1000 [IU] via ORAL
  Filled 2018-05-23 (×12): qty 1

## 2018-05-23 MED ORDER — DEXTROSE 50 % IV SOLN
INTRAVENOUS | Status: AC
  Administered 2018-05-23: 25 mL
  Filled 2018-05-23: qty 50

## 2018-05-23 NOTE — Progress Notes (Addendum)
PROGRESS NOTE    [name]  WJX:[id number] DOB: July 07, 1940 DOA: 05/12/2018 PCP: Lawerance Cruel, MD   Brief Narrative: 78 year old with medical history significant for CHF, history of GI bleed, IV Lasix, dementia, anemia chronic disease, PE on Coumadin presenting acutely facility after being found acutely altered the morning of admission.  Patient is reportedly talkative at baseline.  Patient present lethargic not following commands noted to be weak on the right side.  Code stroke was called.  Patient was not a candidate for TPA because he was outside of the window.  Patient was noted to be febrile with activity 102, hypotensive blood pressure in the 80s improved after IV fluids.  He was hypoxic saturation improved on 3 4 L of oxygen.  CT head was negative for acute stroke.  CT angiogram of the head and neck show greater than 50% of stenosis with calcified and soft plaque of the left internal carotid artery.   Patient was found to be COVID  Positive.   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 Patient treated with IV fluids. Systolic blood pressure stable today.  2-Acute hypoxic respiratory failure secondary to COVID-19: Chest x-ray with low inspiratory volume with bibasilar atelectasis. Continue with oxygen supplementation.. On IV antibiotics to cover for PNA.  CRP 12, lactic acid 1.6, triglycerides 70, ferritin 85, d-dimer 1.0 MRSA PCR negative.  Discontinue vancomycin. Continue with cefepime for now.  We will add azithromycin. Continue with vitamin C and zinc. If these are decompensated will order  Actemra.  3-acute metabolic encephalopathy, right-sided weakness: Patient is more alert today.  MRI was negative for acute or stroke. Discussed with neurology no need for EEG. Right-sided weakness resolved. Okay to continue with Coumadin No need to proceed with 2D echo: MRI negative for acute stroke.  4-Addison Disease; : On chronic steroid: Continue with IV Solu-Cortef every 8 hours for now.  5-diastolic heart failure: Monitor for pulmonary edema. Resume lasix in am, hold today due to NPO status,.  Resume carvedilol.   6-diabetes type 2: Continue sliding scale insulin.  7-hypoglycemia: Received an amp of D50. Swallow evaluation to be performed at bedside.  Hopefully we can start  diet.  8-hypothyroidism: Continue with Synthroid supplementation.  9 history of PE on chronic anticoagulation: Continue with Coumadin.  Chronic kidney disease a stage III  History of Dementia;  On Depakote probably recently started at Nursing home. No history of seizure. Will hold for now      Estimated body mass index is 31.95 kg/m as calculated from the following:   Height as of this encounter: 5\' 10"  (1.778 m).   Weight as of this encounter: 101 kg.   DVT prophylaxis: Coumadin.  Code Status: Full code for now,  discussed with wife. Wouldn't want prolong intubation.  Family Communication:  Wife over phone.  Disposition Plan: Transfer to [facility]. Family aware, discussed with neuro   Consultants:   Neurology    Procedures:   none   Antimicrobials:      Subjective: He is more alert, following command,He was able to say his name.    Objective:  Vitals:   05/23/18 0341 05/23/18 0402 05/23/18 0733 05/23/18 1356  BP: (!) 145/78  133/73 (!) 145/74  Pulse: 97  95   Resp:   18   Temp: 99.1 F (37.3 C)  99 F (37.2 C) 98.7 F (37.1 C)  TempSrc: Oral  Oral Oral  SpO2: 95%   95%  Weight:  101 kg    Height:        Intake/Output Summary (Last 24 hours) at  05/23/2018 1620 Last data filed at 05/23/2018 1400 Gross per 24 hour  Intake 1449.96 ml  Output 425 ml  Net 1024.96 ml   Filed Weights   05/10/2018 1200 05/21/2018 1217 05/23/18 0402  Weight: 73.9 kg 73.9 kg 101 kg    Examination:  General exam: Appears calm and comfortable  Respiratory system; Bilateral ronchus.  Cardiovascular system: S1 & S2 heard, RRR. No JVD, murmurs, rubs, gallops or clicks. No pedal edema. Gastrointestinal system: Abdomen is nondistended, soft and nontender. No organomegaly or masses felt. Normal bowel sounds heard. Central nervous system: Follows command, motor strengthen 5/5 upper and lower extremities. Face symmetric. Speech clear.  Extremities: Symmetric 5 x 5 power. Skin: No rashes, lesions or ulcers    Data Reviewed: I have personally reviewed following labs and imaging studies  CBC: Recent Labs  Lab 05/01/2018 1208 05/23/18 0457  WBC 11.7* 11.3*  NEUTROABS 8.4* 7.1  HGB 14.7 13.6  HCT 46.3 42.1  MCV 91.1 89.6  PLT 229 253   Basic Metabolic Panel: Recent Labs  Lab 04/28/2018 1208 05/19/2018 1236 05/23/18 0457  NA 139  --  139  K 3.8  --  3.9  CL 102  --  110  CO2 26  --  23  GLUCOSE 86  --  66*  BUN 18  --  18  CREATININE 1.34* 1.20 1.23  CALCIUM 8.7*  --  7.9*  MG  --   --  1.9  PHOS  --   --  3.7   GFR: Estimated Creatinine Clearance: 59.9 mL/min (by C-G formula based on SCr of 1.23 mg/dL). Liver Function Tests: Recent Labs  Lab 05/19/2018 1208 05/23/18 0457  AST 20 20  ALT 21 22  ALKPHOS 76 64  BILITOT 0.8 0.7  PROT 5.8* 5.3*  ALBUMIN 2.8* 2.5*   No results for input(s): LIPASE, AMYLASE in the last 168 hours. No results for input(s): AMMONIA in the last 168 hours. Coagulation Profile: Recent Labs  Lab 05/02/2018 1208 05/23/18 0457  INR 2.6* 2.5*   Cardiac Enzymes: Recent Labs  Lab 05/23/18 0457  CKTOTAL 62   BNP (last 3 results) No results for input(s): PROBNP in the last 8760 hours. HbA1C: Recent Labs     05/23/18 0457  HGBA1C 5.8*   CBG: Recent Labs  Lab 05/08/2018 2031 05/23/18 0610 05/23/18 0727 05/23/18 1202  GLUCAP 102* 68* 93 79   Lipid Profile: Recent Labs    05/21/2018 1210 05/23/18 0457  CHOL  --  125  HDL  --  35*  LDLCALC  --  76  TRIG 105 71   70  CHOLHDL  --  3.6   Thyroid Function Tests: No results for input(s): TSH, T4TOTAL, FREET4, T3FREE, THYROIDAB in the last 72 hours. Anemia Panel: Recent Labs    05/07/2018 1208 05/23/18 0457  FERRITIN 49 85   Sepsis Labs: Recent Labs  Lab 05/09/2018 1208 05/02/2018 1920  PROCALCITON 0.15  --   LATICACIDVEN 1.9 1.6    Recent Results (from the past 240  hour(s))  SARS Coronavirus 2 [facility] order, Performed in [facility] hospital lab)     Status: Abnormal   Collection Time: 05/10/2018 12:10 PM  Result Value Ref Range Status   SARS Coronavirus 2 POSITIVE (A) NEGATIVE Final    Comment: RESULT CALLED TO, READ BACK BY AND VERIFIED WITH: RN Lowell Bouton [id number] (NOTE) If result is NEGATIVE SARS-CoV-2 target nucleic acids are NOT DETECTED. The SARS-CoV-2 RNA is generally detectable in upper and lower  respiratory specimens during the acute phase of infection. The lowest  concentration of SARS-CoV-2 viral copies this assay can detect is 250  copies / mL. A negative result does not preclude SARS-CoV-2 infection  and should not be used as the sole basis for treatment or other  patient management decisions.  A negative result may occur with  improper specimen collection / handling, submission of specimen other  than nasopharyngeal swab, presence of viral mutation(s) within the  areas targeted by this assay, and inadequate number of viral copies  (<250 copies / mL). A negative result must be combined with clinical  observations, patient history, and epidemiological information. If result is POSITIVE SARS-CoV-2 target nucleic acids are DETECTED. The SARS-Co V-2 RNA is generally detectable in upper and lower  respiratory  specimens during the acute phase of infection.  Positive  results are indicative of active infection with SARS-CoV-2.  Clinical  correlation with patient history and other diagnostic information is  necessary to determine patient infection status.  Positive results do  not rule out bacterial infection or co-infection with other viruses. If result is PRESUMPTIVE POSTIVE SARS-CoV-2 nucleic acids MAY BE PRESENT.   A presumptive positive result was obtained on the submitted specimen  and confirmed on repeat testing.  While 2019 novel coronavirus  (SARS-CoV-2) nucleic acids may be present in the submitted sample  additional confirmatory testing may be necessary for epidemiological  and / or clinical management purposes  to differentiate between  SARS-CoV-2 and other Sarbecovirus currently known to infect humans.  If clinically indicated additional testing with an alternate test  methodology [phone]) is advised.  The SARS-CoV-2 RNA is generally  detectable in upper and lower respiratory specimens during the acute  phase of infection. The expected result is Negative. Fact Sheet for Patients:  StrictlyIdeas.no Fact Sheet for Healthcare Providers: BankingDealers.co.za This test is not yet approved or cleared by the [place] FDA and has been authorized for detection and/or diagnosis of SARS-CoV-2 by FDA under an Emergency Use Authorization (EUA).  This EUA will remain in effect (meaning this test can be used) for the duration of the COVID-19 declaration under Section 564(b)(1) of the Act, 21 U.S.C. section 360bbb-3(b)(1), unless the authorization is terminated or revoked sooner. Performed at [facility], [place] [address], [place], [place] [address]   Blood Culture (routine x 2)     Status: None (Preliminary result)   Collection Time: 05/15/2018 12:16 PM  Result Value Ref Range Status   Specimen Description BLOOD RIGHT ANTECUBITAL  Final     Special Requests   Final    BOTTLES DRAWN AEROBIC AND ANAEROBIC Blood Culture adequate volume   Culture   Final    NO GROWTH < 24 HOURS Performed at [facility], [place] [address], [place], [place] [address]    Report Status PENDING  Incomplete  Blood Culture (routine x 2)     Status: None (Preliminary result)   Collection Time: 05/21/2018  6:42 PM  Result Value Ref Range  Status   Specimen Description BLOOD LEFT ANTECUBITAL  Final   Special Requests   Final    BOTTLES DRAWN AEROBIC ONLY Blood Culture results may not be optimal due to an inadequate volume of blood received in culture bottles   Culture   Final    NO GROWTH < 24 HOURS Performed at [facility] [address], [place], [place] [address]    Report Status PENDING  Incomplete  MRSA PCR Screening     Status: None   Collection Time: 05/23/18 10:45 AM  Result Value Ref Range Status   MRSA by PCR NEGATIVE NEGATIVE Final    Comment:        The GeneXpert MRSA Assay (FDA approved for NASAL specimens only), is one component of a comprehensive MRSA colonization surveillance program. It is not intended to diagnose MRSA infection nor to guide or monitor treatment for MRSA infections. Performed at [facility], [place] [address] [place], [place] [address]          Radiology Studies: Ct Angio Head W Or Wo Contrast  Result Date: 05/17/2018 CLINICAL DATA:  RIGHT-sided weakness. Altered mental status. Facial droop. EXAM: CT ANGIOGRAPHY HEAD AND NECK TECHNIQUE: Multidetector CT imaging of the head and neck was performed using the standard protocol during bolus administration of intravenous contrast. Multiplanar CT image reconstructions and MIPs were obtained to evaluate the vascular anatomy. Carotid stenosis measurements (when applicable) are obtained utilizing NASCET criteria, using the distal internal carotid diameter as the denominator. CONTRAST:  74mL OMNIPAQUE IOHEXOL 350 MG/ML SOLN COMPARISON:  Code stroke CT  earlier in the day. FINDINGS: CTA NECK FINDINGS Aortic arch: Standard branching. Imaged portion shows no evidence of aneurysm or dissection. No significant stenosis of the major arch vessel origins. Aortic atherosclerosis. Right carotid system: No evidence of dissection, stenosis (50% or greater) or occlusion. Moderate calcific and soft plaque at the bifurcation. Left carotid system: Slightly greater than 50% irregular stenosis, calcific and soft plaque, at the RIGHT internal carotid artery origin. This is based on luminal measurements of 1.9/4.1 proximal/distal. No dissection. No intraluminal thrombus. Vertebral arteries: Nonstenotic calcific plaque at both vertebral origins. Both vessels are patent through the neck, codominant. Skeleton: Spondylosis. No worrisome osseous lesion. Other neck: No neck masses. Upper chest: Incompletely visualized cloudlike airspace opacity RIGHT upper lobe posterior segment. Small RIGHT pleural effusion. Review of the MIP images confirms the above findings CTA HEAD FINDINGS Anterior circulation: No significant stenosis, proximal occlusion, aneurysm, or vascular malformation. Nonstenotic atheromatous change both carotid siphons. No M1 or M2 disease of significance. Dominant RIGHT A1 ACA. Posterior circulation: No significant stenosis, proximal occlusion, aneurysm, or vascular malformation. Venous sinuses: As permitted by contrast timing, patent. Anatomic variants: None of significance. Delayed phase: Not performed. Review of the MIP images confirms the above findings IMPRESSION: 1. Slightly greater than 50% stenosis, calcific and soft plaque at the LEFT internal carotid artery origin. This is non flow reducing. 2. No intracranial stenosis or vascular occlusion. 3. Incompletely visualized cloudy airspace opacity RIGHT upper lobe posterior segment, which could represent pneumonia. Small RIGHT pleural effusion. 4. These results were communicated to Dr. Lorraine Lax at [place] 04/05/2020by text  page via the [facility] messaging system. Electronically Signed   By: Staci Righter M.D.   On: 05/13/2018 13:20   Ct Angio Neck W Or Wo Contrast  Result Date: 05/22/2018 CLINICAL DATA:  RIGHT-sided weakness. Altered mental status. Facial droop. EXAM: CT ANGIOGRAPHY HEAD AND NECK TECHNIQUE: Multidetector CT imaging of the  head and neck was performed using the standard protocol during bolus administration of intravenous contrast. Multiplanar CT image reconstructions and MIPs were obtained to evaluate the vascular anatomy. Carotid stenosis measurements (when applicable) are obtained utilizing NASCET criteria, using the distal internal carotid diameter as the denominator. CONTRAST:  33mL OMNIPAQUE IOHEXOL 350 MG/ML SOLN COMPARISON:  Code stroke CT earlier in the day. FINDINGS: CTA NECK FINDINGS Aortic arch: Standard branching. Imaged portion shows no evidence of aneurysm or dissection. No significant stenosis of the major arch vessel origins. Aortic atherosclerosis. Right carotid system: No evidence of dissection, stenosis (50% or greater) or occlusion. Moderate calcific and soft plaque at the bifurcation. Left carotid system: Slightly greater than 50% irregular stenosis, calcific and soft plaque, at the RIGHT internal carotid artery origin. This is based on luminal measurements of 1.9/4.1 proximal/distal. No dissection. No intraluminal thrombus. Vertebral arteries: Nonstenotic calcific plaque at both vertebral origins. Both vessels are patent through the neck, codominant. Skeleton: Spondylosis. No worrisome osseous lesion. Other neck: No neck masses. Upper chest: Incompletely visualized cloudlike airspace opacity RIGHT upper lobe posterior segment. Small RIGHT pleural effusion. Review of the MIP images confirms the above findings CTA HEAD FINDINGS Anterior circulation: No significant stenosis, proximal occlusion, aneurysm, or vascular malformation. Nonstenotic atheromatous change both carotid siphons. No M1 or M2  disease of significance. Dominant RIGHT A1 ACA. Posterior circulation: No significant stenosis, proximal occlusion, aneurysm, or vascular malformation. Venous sinuses: As permitted by contrast timing, patent. Anatomic variants: None of significance. Delayed phase: Not performed. Review of the MIP images confirms the above findings IMPRESSION: 1. Slightly greater than 50% stenosis, calcific and soft plaque at the LEFT internal carotid artery origin. This is non flow reducing. 2. No intracranial stenosis or vascular occlusion. 3. Incompletely visualized cloudy airspace opacity RIGHT upper lobe posterior segment, which could represent pneumonia. Small RIGHT pleural effusion. 4. These results were communicated to Dr. Lorraine Lax at [place] 04/17/2020by text page via the [facility] messaging system. Electronically Signed   By: Staci Righter M.D.   On: 05/05/2018 13:20   Dg Chest Portable 1 View  Result Date: 05/19/2018 CLINICAL DATA:  78 year old male with altered mental status, lethargic, wheezing EXAM: PORTABLE CHEST 1 VIEW COMPARISON:  Prior chest x-ray 12/31/2017 FINDINGS: Lower inspiratory volumes with minimal bibasilar atelectasis. No evidence of pulmonary edema, focal airspace consolidation, pleural effusion or pneumothorax. Stable cardiac and mediastinal contours. Atherosclerotic calcifications visualized in the transverse aorta. No acute osseous abnormality. Surgical clips in the right upper quadrant suggest prior cholecystectomy. IMPRESSION: Low inspiratory volumes with bibasilar atelectasis. Otherwise, no acute cardiopulmonary process. Electronically Signed   By: Jacqulynn Cadet M.D.   On: 05/21/2018 13:33   Ct Head Code Stroke Wo Contrast  Result Date: 05/22/2018 CLINICAL DATA:  Code stroke. RIGHT-sided weakness, altered mental status with facial droop. EXAM: CT HEAD WITHOUT CONTRAST TECHNIQUE: Contiguous axial images were obtained from the base of the skull through the vertex without intravenous contrast.  COMPARISON:  04/22/2017. FINDINGS: Brain: No evidence for acute infarction, hemorrhage, mass lesion, hydrocephalus, or extra-axial fluid. Generalized atrophy. T2 and FLAIR hyperintensities throughout the white matter, consistent with small vessel disease. Vascular: Calcification of the cavernous internal carotid arteries consistent with cerebrovascular atherosclerotic disease. No signs of intracranial large vessel occlusion. Skull: Normal. Negative for fracture or focal lesion. Sinuses/Orbits: Chronic RIGHT maxillary sinusitis. Negative orbits. Other: None. ASPECTS [facility] [place] [facility] Stroke [facility] Early CT Score) - Ganglionic level infarction (caudate, lentiform nuclei, internal capsule, insula, M1-M3 cortex): 7 - Supraganglionic infarction (M4-M6 cortex): 3  Total score (0-10 with 10 being normal): 10 IMPRESSION: 1. Atrophy and small vessel disease. No acute intracranial findings. 2. ASPECTS is 10. 3. These results were communicated to Dr. Lorraine Lax at 1:07 [facility] 04/23/2020by text page via the [facility] messaging system. * Electronically Signed   By: Staci Righter M.D.   On: 05/22/2018 13:09        Scheduled Meds:  albuterol  2 puff Inhalation Q6H   hydrocortisone sod succinate (SOLU-CORTEF) inj  50 mg Intravenous Q8H   vitamin C  500 mg Oral Daily   Warfarin - Pharmacist Dosing Inpatient   Does not apply q1800   zinc sulfate  220 mg Oral Daily   Continuous Infusions:  sodium chloride 75 mL/hr at 05/23/18 0513   ceFEPime (MAXIPIME) IV 2 g (05/23/18 1519)   vancomycin       LOS: 1 day    Time spent: 35 minutes.     Elmarie Shiley, MD [facility] Pager [phone]  If 7PM-7AM, please contact night-coverage [email or web address] Password [email or web address] 05/23/2018, 4:20 PM

## 2018-05-23 NOTE — Progress Notes (Signed)
PT Evaluation  PT impression:  Pt admitted with above diagnosis. Pt currently with functional limitations due to the deficits listed below (see PT Problem List). Pt needing max A for bed mobility and mod/ max A to maintain sitting EOB due to posterior lean. Unable to attempt standing due to posterior lean. Increased tone BLE's L>R. SpO2 remained stable in 90's on 2L O2 and HR in 90's throughout.  Pt will benefit from skilled PT to increase their independence and safety with mobility to allow discharge to the venue listed below.      05/23/18 0918  PT Visit Information  Last PT Received On 05/23/18  Assistance Needed +2  History of Present Illness 78 yo male presenting from SNF with AMS. Tested COVID positive. PMH including CHF, h/o GI bleeding, Addison's disease, dementia, anemia chronic disease, and pulmonary embolus.   Precautions  Precautions Fall  Restrictions  Weight Bearing Restrictions No  Home Living  Family/patient expects to be discharged to: Skilled nursing facility  Additional Comments Pt from [facility]  Prior Function  Level of Independence Needs assistance  Gait / Transfers Assistance Needed pt cannot give PLOF, was ambulating with RW ith mod A per last admission  ADL's / Homemaking Assistance Needed needs assistance  Comments poor historian, cannot tell me what he was doing at [facility] functionally  Communication  Communication No difficulties  Pain Assessment  Pain Assessment No/denies pain  Cognition  Arousal/Alertness Awake/alert  Behavior During Therapy WFL for tasks assessed/performed  Overall Cognitive Status Impaired/Different from baseline  Area of Impairment Orientation;Memory;Following commands;Problem solving  Orientation Level Disoriented to;Place;Time;Situation  Memory Decreased recall of precautions;Decreased short-term memory  Following Commands Follows one step commands inconsistently;Follows one step commands with increased time  Problem Solving Slow  processing;Decreased initiation;Difficulty sequencing;Requires verbal cues;Requires tactile cues  General Comments pt inconsistent with being able to answer questions, noted apraxia with mobility  Upper Extremity Assessment  Upper Extremity Assessment Defer to OT evaluation  Lower Extremity Assessment  Lower Extremity Assessment Generalized weakness;RLE deficits/detail;LLE deficits/detail  RLE Deficits / Details pt with increased adductor tone, moves RLE more freely than L, hip flex 2-/5, knee ext 2/5  RLE Sensation WNL  RLE Coordination decreased fine motor;decreased gross motor  LLE Deficits / Details very stiff, holds L ankle crossed over R and unable to uncross on his own. hip flex 1/5, knee ext 2-/5  LLE Sensation WNL  LLE Coordination decreased fine motor;decreased gross motor  Cervical / Trunk Assessment  Cervical / Trunk Assessment Kyphotic  Bed Mobility  Overal bed mobility Needs Assistance  Bed Mobility Rolling;Sidelying to Sit;Sit to Supine  Rolling Mod assist  Sidelying to sit Max assist  Sit to supine Max assist  General bed mobility comments pt rolled to both sides for removal of soaked linen, vc's to grasp rail and mod A at hip to complete roll. Max A for LE's off bed and elevation of trunk into sitting. Max A for returning to supine  Transfers  General transfer comment strong posterior lean, unable to attempt standing  Ambulation/Gait  General Gait Details unable  Modified Rankin (Stroke Patients Only)  Pre-Morbid Rankin Score 5  Modified Rankin 5  Balance  Overall balance assessment Needs assistance  Sitting-balance support Single extremity supported;Feet supported  Sitting balance-Leahy Scale Poor  Sitting balance - Comments pt with posterior lean, able to lean fwd when hands were placed on knees and maintain ~10 secs before leaning posterior again and needing mod/ max A.  Postural control Posterior lean  General Comments  General comments (skin integrity, edema,  etc.) SpO2 96% on 2L O2. HR mid 90's.   Exercises  Exercises General Lower Extremity  General Exercises - Lower Extremity  Ankle Circles/Pumps Both;10 reps;Supine;AAROM  Long Arc Quad AROM;Both;5 reps;Seated  PT - End of Session  Equipment Utilized During Treatment Oxygen  Activity Tolerance Patient limited by fatigue  Patient left in bed;with call bell/phone within reach;with bed alarm set;with nursing/sitter in room  Nurse Communication Mobility status  PT Assessment  PT Recommendation/Assessment Patient needs continued PT services  PT Visit Diagnosis Unsteadiness on feet (R26.81);Muscle weakness (generalized) (M62.81);Difficulty in walking, not elsewhere classified (R26.2)  PT Problem List Decreased strength;Decreased activity tolerance;Decreased range of motion;Decreased balance;Decreased mobility;Decreased coordination;Decreased cognition;Decreased knowledge of precautions;Impaired tone  PT Plan  PT Frequency (ACUTE ONLY) Min 2X/week  PT Treatment/Interventions (ACUTE ONLY) DME instruction;Functional mobility training;Therapeutic activities;Therapeutic exercise;Balance training;Neuromuscular re-education;Cognitive remediation;Patient/family education;Gait training  AM-PAC PT "6 Clicks" Mobility Outcome Measure (Version 2)  Help needed turning from your back to your side while in a flat bed without using bedrails? 2  Help needed moving from lying on your back to sitting on the side of a flat bed without using bedrails? 1  Help needed moving to and from a bed to a chair (including a wheelchair)? 1  Help needed standing up from a chair using your arms (e.g., wheelchair or bedside chair)? 1  Help needed to walk in hospital room? 1  Help needed climbing 3-5 steps with a railing?  1  6 Click Score 7  Consider Recommendation of Discharge To: CIR/SNF/LTACH  PT Recommendation  Follow Up Recommendations SNF;Supervision/Assistance - 24 hour  PT equipment None recommended by PT  Individuals  Consulted  Consulted and Agree with Results and Recommendations Patient  Acute Rehab PT Goals  Patient Stated Goal none stated  PT Goal Formulation With patient  Time For Goal Achievement 06/06/18  Potential to Achieve Goals Fair  PT Time Calculation  PT Start Time (ACUTE ONLY) 0930  PT Stop Time (ACUTE ONLY) 0959  PT Time Calculation (min) (ACUTE ONLY) 29 min  PT General Charges  $$ ACUTE PT VISIT 1 Visit  PT Evaluation  $PT Eval Moderate Complexity 1 Mod  PT Treatments  $Neuromuscular Re-education 8-22 mins  Written Expression  Dominant Hand Right   Leighton Roach, PT  Acute Rehab Services  Pager [phone] Office [phone]

## 2018-05-23 NOTE — Progress Notes (Signed)
    Review of order for echo:  [name] has a history of well-preserved ventricular systolic function.  His last echocardiogram was in March, 2019.  Headache the on the auto bit is at that time his left-ventricular ejection fraction was 50 to 55%.  He has grade 1 diastolic dysfunction.  He was admitted to the hospital last night from [place] where they had a significant incidence of COVID-19.  He had a temperature of 102.4 degrees.  He is positive for COVID-19.  He also has altered mental status and was thought to have a stroke.  The echocardiogram was ordered for further evaluation of the stroke.  He has a history of pulmonary emboli and has been on Coumadin.  His INR is 2.5.  I discussed the case with Dr. Tyrell Antonio.  We are trying to significantly limit the echos performed at this time given the COVID 19 pandemic.  I do not think that an echo is indicated.  He has a definite cause for his respiratory difficulty He is on coumadin with a theraputic INR so LA thrombus is very unlikely ( and is already adequately treated )   She will cancel the echo     Mertie Moores, MD  05/23/2018 11:20 AM    [place] [address],  [place], [place]  [address] Pager [phone] Phone: [phone]; Fax: [phone]    I do not think that

## 2018-05-23 NOTE — Progress Notes (Signed)
[place] for warfarin Indication: VTE history   Assessment: 89 yom on warfarin PTA for PE/DVT history presenting with AMS, sepsis due to COVID positive. CVA work-up initiated. Pharmacy consulted to dose warfarin inpatient. INR therapeutic at 2.6 on admit. INR 2.5 today Noted remote hx of GIB, but no active bleed issues documented. CBC wnl, d-dimer 1.24 on admit.  Noted, patient received 1x dose of Flagyl in the ER - will likely cause INR to bump due to DDI with warfarin. Flagyl order has not been continued at this time.  Spoke to Dr. Lorraine Lax who is fine with restarting warfarin in this patient. D/W primary MD and will restart tonight.   PTA warfarin dose: 5mg  daily except 2.5mg  on Tues/Fri (last dose 4/26 PTA)  Goal of Therapy:  INR 2-3 Monitor platelets by anticoagulation protocol: Yes   Plan:  Warfarin 2.5mg  PO x 1 dose - watch closely after receiving Flagyl in the ER Daily INR Monitor CBC, s/sx bleeding   [name] A. Levada Dy, PharmD, [place] Please utilize Amion for appropriate phone number to reach the unit pharmacist ([place])   05/23/2018 4:21 PM

## 2018-05-23 NOTE — Progress Notes (Addendum)
Reason for consult: Possible stroke  Subjective: Patient has significantly improved today, no longer has focal weakness-if anything mild left leg weakness (presented yesterday with a right sided weakness).    ROS:  febrile  Examination (performed using video teleconferencing with assistant of patient's nurse as patient is COVID 19 positive to minimize PPE)   Vital signs in last 24 hours: Temp:  [98.3 F (36.8 C)-102.4 F (39.1 C)] 99 F (37.2 C) (04/28 0733) Pulse Rate:  [76-108] 95 (04/28 0733) Resp:  [16-24] 18 (04/28 0733) BP: (80-145)/(51-81) 133/73 (04/28 0733) SpO2:  [92 %-100 %] 95 % (04/28 0341) Weight:  [73.9 kg-101 kg] 101 kg (04/28 0402)  General: Sitting up on the bed CVS: pulse-normal rate and rhythm RS: breathing comfortably Extremities: normal   Neuro: MS: Alert, oriented to place, age and place, follows commands CN: pupils equal and reactive,  EOMI, face symmetric, tongue midline, normal sensation over face, Motor: 5/5 strength in both upper extremities, 5/ 5 strength in right lower extremity and 4/5 strength over left lower extremity Reflexes: Not assessed due to video conferencing Coordination: normal Gait: not tested  Basic Metabolic Panel: Recent Labs  Lab 05/09/2018 1208 05/14/2018 1236 05/23/18 0457  NA 139  --  139  K 3.8  --  3.9  CL 102  --  110  CO2 26  --  23  GLUCOSE 86  --  66*  BUN 18  --  18  CREATININE 1.34* 1.20 1.23  CALCIUM 8.7*  --  7.9*  MG  --   --  1.9  PHOS  --   --  3.7    CBC: Recent Labs  Lab 05/14/2018 1208 05/23/18 0457  WBC 11.7* 11.3*  NEUTROABS 8.4* 7.1  HGB 14.7 13.6  HCT 46.3 42.1  MCV 91.1 89.6  PLT 229 203     Coagulation Studies: Recent Labs    05/20/2018 05-17-06 05/23/18 0457  LABPROT 27.7* 26.5*  INR 2.6* 2.5*    Imaging Reviewed: CT head and CT angiograms reviewed from yesterday.  MRI brain subsequently    ASSESSMENT AND PLAN  CKD, coronary artery disease, hypertension, obesity, PE on  Coumadin presented with acute altered mental status-noted to be septic with SARS-CoV-2 infection.  Also noted to have right-sided weakness.  CT head and CT angiogram was performed.  CT head did not show hemorrhage/acute findings and CT angiogram was negative for large vessel occlusion. Suspect patient has coagulated encephalopathy, acute stroke not LVO versus seizure/postictal state.  Subsequently improved, appears to be almost close to baseline.  Recommend MRI brain.   Encephalopathy in the setting of SARS-CoV-2/sepsis Possible acute ischemic stroke/TIA  Recommend # MRI of the brain without contrast  #Transthoracic Echo : Not been performed as patient is COVID-19 positive #EEG: Low suspicion for status, so not being performed per EEG COVID-19 protocol #. Continue warfarin #Start or continue Atorvastatin 40 mg/other high intensity statin # BP goal: permissive HTN upto 185/110 mmHg # HBAIC and Lipid profile # Telemetry monitoring # Frequent neuro checks # stroke swallow screen   Addendum Reviewed MRI brain: Negative for acute infarct.  Has significant chronic white matter disease.  I suspect patient's exam likely in the setting of sepsis.  Focal deficits were fluctuating and likely due to inconsistent participation of exam, however TIA, seizure cannot be ruled out.  He does not need any further TIA work-up-he is already on Coumadin.  Unable to get EEG as mentioned above.  If he has been another episode to  mental status changes unexplained by metabolic state , may consider empiric Keppra. Impression: COVID-19 related encephalopathy   [name] [facility] Pager Number [phone] For questions after 7pm please refer to AMION to reach the Neurologist on call

## 2018-05-23 NOTE — Progress Notes (Signed)
[name] for warfarin Indication: VTE history   Assessment: 23 yom on warfarin PTA for PE/DVT history presenting with AMS, sepsis due to COVID positive. CVA work-up initiated. Pharmacy consulted to dose warfarin inpatient. INR therapeutic at 2.6 on admit. Noted remote hx of GIB, but no active bleed issues documented. CBC wnl, d-dimer 1.24 on admit.  Noted, patient received 1x dose of Flagyl in the ER on 4/27- will likely cause INR to bump due to DDI with warfarin. Flagyl order has not been continued at this time.   INR = 2.5 today, CBC remains wnl. Order from Dr. Ethelle Lyon today 4/28 to hold coumadin doses until MRI results available and follow neurology recommendation for resumption.   PTA warfarin dose: 5mg  daily except 2.5mg  on Tues/Fri (last dose 4/26 PTA)  Goal of Therapy:  INR 2-3 Monitor platelets by anticoagulation protocol: Yes   Plan:  Hold Warfarin until MRI results available and follow neurology recommendation for resumption.  Daily INR Monitor CBC, s/sx bleeding  Nicole Cella, RPh Clinical Pharmacist [phone] Please check AMION for all [place] phone numbers After 10:00 PM, call [place] [phone] 05/23/2018 10:56 AM

## 2018-05-23 NOTE — Progress Notes (Signed)
Hypoglycemic Event  CBG: 68 @ 0610  Treatment: 1/2 amp d50  Symptoms:   Follow-up CBG: Time:0727 CBG Result:93  Possible Reasons for Event: NPO  Comments/MD notified: KIRBY by night shift RN  Ula Lingo [facility]

## 2018-05-23 NOTE — Consult Note (Addendum)
   [facility] CM Inpatient Consult   05/23/2018  DUPREE GIVLER 28-May-1940 [id number]    Patient's chart reviewed to check if potential for [place] Management services as benefit from [place] with 39%, extreme high risk scorefor unplanned readmissions and 3 hospitalizationsin the past 6 months, however, noted that patient lives at a skilled nursing facility (SNF) at [place] since December . He is positive for COVID-19.  Per chart review and history and physical on 05/09/2018 reveal as follows:  Jeter R. Fehnel is a 78 y.o. male with medical history significant of CHF, h/o GI bleeding, Addison's disease, dementia, anemia chronic disease, pulmonary embolus on Coumadin. He presented from skilled nursing facility after being found with acute altered mental status, reportedly talkative at baseline; febrile up to 102.4 F. Noted to be septic with SARS-CoV-2 infection. Patient was seen as a code stroke and initial CT scan labs revealed no acute signs of a stroke.  Per PT/ OT evaluation and recommendation, patient will likely to return back to skilled nursing facility when stable.  If this disposition changes, please place a [facility] Care Management consult or for questions contact:  Edwena Felty A. Reuel Lamadrid, BSN, RN-BC [facility] Liaison Cell: [phone]

## 2018-05-23 NOTE — Progress Notes (Signed)
SLP Cancellation Note  Patient Details Name: [name] MRN: [id number] DOB: 11-23-1940   Cancelled treatment:       Reason Eval/Treat Not Completed: SLP screened, no needs identified, will sign off. Per chart review and discussion with acute therapists who saw patient, patient is likely at or near baseline for cognitive functioning and per chart, patient premorbidly had advanced dementia. CT Head from 4/27 revealed atrophy and small vessel disease but no acute intracranial findings. Will s/o at this time but please reorder speech therapy if patient requires evaluation of swallow function. Will defer any further speech therapy services to next venue of care. Thank you!   Nadara Mode Tarrell 05/23/2018, 2:16 PM   Sonia Baller, MA, CCC-SLP Speech Therapy [facility] Acute Rehab Pager: [phone]

## 2018-05-23 NOTE — Evaluation (Signed)
Occupational Therapy Evaluation Patient Details Name: [name] MRN: [id number] DOB: 1941/01/08 Today's Date: 05/23/2018    History of Present Illness 78 yo male presenting from SNF with AMS. Tested COVID positive. PMH including CHF, h/o GI bleeding, Addison's disease, dementia, anemia chronic disease, and pulmonary embolus.    Clinical Impression   PTA, pt was at [facility] SNF for rehab since December and required assistance for ADLs and mobility; information provided by wife. Pt requiring Min A for grooming at bed level and Max A for dressing, bathing, and toileting. Pt requiring Max A to reposition in bed; placing bed in chair position. Pt presenting with decreased cognition, strength, and activity tolerance. SpO2 96-91% on RA. Assisting pt in calling his wife so he could talk with her; also updated wife on therapy today. Pt would benefit from further acute OT to facilitate safe dc. Recommend dc to SNF for further OT to optimize safety, independence with ADLs, and return to PLOF.      Follow Up Recommendations  SNF;Supervision/Assistance - 24 hour    Equipment Recommendations  None recommended by OT    Recommendations for Other Services PT consult     Precautions / Restrictions Precautions Precautions: Fall Restrictions Weight Bearing Restrictions: No      Mobility Bed Mobility Overal bed mobility: Needs Assistance             General bed mobility comments: Pt requiring Max A to reposition in the bed. Pt able to follow cues to reach bed rails and assisting in pulling towards [facility] with bed in trendelenbrug. Placing pt in chair position to optimize posture for grooming task  Transfers                 General transfer comment: Defered    Balance Overall balance assessment: Needs assistance   Sitting balance-Leahy Scale: Poor Sitting balance - Comments: In bed positioned to chair position, pt falling laterally to right                                   ADL either performed or assessed with clinical judgement   ADL Overall ADL's : Needs assistance/impaired Eating/Feeding: NPO   Grooming: Oral care;Minimal assistance;Bed level Grooming Details (indicate cue type and reason): Pt performing oral care with Min A for some sequencing. Pt requiring assistance to retwist cap on toothpaste Upper Body Bathing: Moderate assistance;Bed level   Lower Body Bathing: Maximal assistance;Bed level   Upper Body Dressing : Moderate assistance;Bed level   Lower Body Dressing: Maximal assistance;Bed level Lower Body Dressing Details (indicate cue type and reason): donning socks               General ADL Comments: Pt presenting with decreased strength and activity tolerance. Pt performing oral care at bed level. Calling pt's wife ([name]) to follow PLOF and let pt talk with her.     Vision Baseline Vision/History: Wears glasses Wears Glasses: At all times       Perception     Praxis      Pertinent Vitals/Pain Pain Assessment: No/denies pain     Hand Dominance Right   Extremity/Trunk Assessment Upper Extremity Assessment Upper Extremity Assessment: Defer to OT evaluation   Lower Extremity Assessment Lower Extremity Assessment: Generalized weakness;RLE deficits/detail;LLE deficits/detail RLE Deficits / Details: pt with increased adductor tone       Communication Communication Communication: No difficulties   Cognition Arousal/Alertness: Awake/alert Behavior  During Therapy: WFL for tasks assessed/performed Overall Cognitive Status: History of cognitive impairments - at baseline Area of Impairment: Orientation;Memory;Following commands;Problem solving                 Orientation Level: Disoriented to;Place;Time;Situation   Memory: Decreased recall of precautions;Decreased short-term memory Following Commands: Follows one step commands inconsistently;Follows one step commands with increased time     Problem  Solving: Slow processing;Decreased initiation;Difficulty sequencing;Requires verbal cues;Requires tactile cues General Comments: Baseline dementia. Pt following simple commands and agreeable to therapy. Pt able to provide information about his wife and family. Wife reports that recently pt seemed more confused than baseline.   General Comments  SpO2 96-91% on RA. HR 98. Placed back on 2L O2 upon ending eval    Exercises     Shoulder Instructions      Home Living Family/patient expects to be discharged to:: [place]: [place] - 2 wheels;Shower seat   Additional Comments: [facility] since Dec for rehab per wife      Prior Functioning/Environment Level of Independence: Needs assistance  Gait / Transfers Assistance Needed: standing with therapy for ~15 second at SNF ADL's / Homemaking Assistance Needed: Assistance for ADLs   Comments: Prior to December, pt was at home with wife who assisted with ADLs. Wife providing information        OT Problem List: Decreased strength;Decreased range of motion;Decreased activity tolerance;Impaired balance (sitting and/or standing);Decreased knowledge of use of DME or AE;Decreased knowledge of precautions;Decreased safety awareness;Cardiopulmonary status limiting activity;Decreased cognition      OT Treatment/Interventions: Self-care/ADL training;Therapeutic exercise;Energy conservation;DME and/or AE instruction;Therapeutic activities;Patient/family education    OT Goals(Current goals can be found in the care plan section) Acute Rehab OT Goals Patient Stated Goal: Unstated by pt. wife wanting him safe and return to rehab OT Goal Formulation: With patient Time For Goal Achievement: 06/06/18 Potential to Achieve Goals: Good  OT Frequency: Min 2X/week   Barriers to D/C:            Co-evaluation              AM-PAC OT "6 Clicks" Daily Activity     Outcome Measure Help  from another person eating meals?: Total Help from another person taking care of personal grooming?: A Little Help from another person toileting, which includes using toliet, bedpan, or urinal?: A Lot Help from another person bathing (including washing, rinsing, drying)?: A Lot Help from another person to put on and taking off regular upper body clothing?: A Lot Help from another person to put on and taking off regular lower body clothing?: Total 6 Click Score: 11   End of Session Nurse Communication: Mobility status;Other (comment)(SpO2)  Activity Tolerance: Patient tolerated treatment well Patient left: in bed;with call bell/phone within reach;with bed alarm set  OT Visit Diagnosis: Unsteadiness on feet (R26.81);Other abnormalities of gait and mobility (R26.89);Muscle weakness (generalized) (M62.81);Other symptoms and signs involving cognitive function                Time: 2841-3244 OT Time Calculation (min): 28 min Charges:  OT General Charges $OT Visit: 1 Visit OT Evaluation $OT Eval Moderate Complexity: 1 Mod OT Treatments $Self Care/Home Management : 8-22 mins  Damyon Mullane MSOT, OTR/L Acute Rehab Pager: [phone] Office: [place] 05/23/2018, 12:45 PM
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
## 2018-05-23 NOTE — Progress Notes (Signed)
[facility] and gave report to [facility]. Provider note states family is aware of pt's transfer to [facility].

## 2018-05-23 NOTE — Progress Notes (Signed)
Inpatient Diabetes Program Recommendations  AACE/ADA: New Consensus Statement on Inpatient Glycemic Control (2015)  Target Ranges:  Prepandial:   less than 140 mg/dL      Peak postprandial:   less than 180 mg/dL (1-2 hours)      Critically ill patients:  140 - 180 mg/dL   Lab Results  Component Value Date   GLUCAP 93 05/23/2018   HGBA1C 5.8 (H) 05/23/2018    Review of Glycemic Control Results for NIKOLAI, WILCZAK (MRN [id number]) as of 05/23/2018 10:19  Ref. Range 05/24/2018 20:31 05/23/2018 06:10 05/23/2018 07:27  Glucose-Capillary Latest Ref Range: 70 - 99 mg/dL 102 (H) 68 (L) 93   Diabetes history: Type 2 Dm Outpatient Diabetes medications: Lantus 26 units QHS Current orders for Inpatient glycemic control: none  Solumedrol 50 mg Q8H  Inpatient Diabetes Program Recommendations:    Noted hypoglycemia this AM of 66 mg/dL and initiation of hypoglycemia protocol.   MD please ensure CBGs are being collected Q4H, as patient is NPO.  Thanks, Bronson Curb, MSN, RNC-OB Diabetes Coordinator [phone] (8a-5p)

## 2018-05-24 DIAGNOSIS — I5032 Chronic diastolic (congestive) heart failure: Secondary | ICD-10-CM

## 2018-05-24 DIAGNOSIS — R6510 Systemic inflammatory response syndrome (SIRS) of non-infectious origin without acute organ dysfunction: Secondary | ICD-10-CM

## 2018-05-24 LAB — CBC WITH DIFFERENTIAL/PLATELET
Abs Immature Granulocytes: 0.05 10*3/uL (ref 0.00–0.07)
Basophils Absolute: 0 10*3/uL (ref 0.0–0.1)
Basophils Relative: 0 %
Eosinophils Absolute: 0 10*3/uL (ref 0.0–0.5)
Eosinophils Relative: 0 %
HCT: 40.5 % (ref 39.0–52.0)
Hemoglobin: 13.5 g/dL (ref 13.0–17.0)
Immature Granulocytes: 1 %
Lymphocytes Relative: 18 %
Lymphs Abs: 2 10*3/uL (ref 0.7–4.0)
MCH: 29.5 pg (ref 26.0–34.0)
MCHC: 33.3 g/dL (ref 30.0–36.0)
MCV: 88.4 fL (ref 80.0–100.0)
Monocytes Absolute: 1.1 10*3/uL — ABNORMAL HIGH (ref 0.1–1.0)
Monocytes Relative: 10 %
Neutro Abs: 7.8 10*3/uL — ABNORMAL HIGH (ref 1.7–7.7)
Neutrophils Relative %: 71 %
Platelets: 228 10*3/uL (ref 150–400)
RBC: 4.58 MIL/uL (ref 4.22–5.81)
RDW: 16.8 % — ABNORMAL HIGH (ref 11.5–15.5)
WBC: 11 10*3/uL — ABNORMAL HIGH (ref 4.0–10.5)
nRBC: 0 % (ref 0.0–0.2)

## 2018-05-24 LAB — RAPID URINE DRUG SCREEN, HOSP PERFORMED
Amphetamines: NOT DETECTED
Barbiturates: NOT DETECTED
Benzodiazepines: NOT DETECTED
Cocaine: NOT DETECTED
Opiates: NOT DETECTED
Tetrahydrocannabinol: NOT DETECTED

## 2018-05-24 LAB — GLUCOSE, CAPILLARY
Glucose-Capillary: 83 mg/dL (ref 70–99)
Glucose-Capillary: 95 mg/dL (ref 70–99)

## 2018-05-24 LAB — COMPREHENSIVE METABOLIC PANEL
ALT: 23 U/L (ref 0–44)
AST: 21 U/L (ref 15–41)
Albumin: 2.7 g/dL — ABNORMAL LOW (ref 3.5–5.0)
Alkaline Phosphatase: 61 U/L (ref 38–126)
Anion gap: 9 (ref 5–15)
BUN: 19 mg/dL (ref 8–23)
CO2: 19 mmol/L — ABNORMAL LOW (ref 22–32)
Calcium: 7.8 mg/dL — ABNORMAL LOW (ref 8.9–10.3)
Chloride: 109 mmol/L (ref 98–111)
Creatinine, Ser: 1.02 mg/dL (ref 0.61–1.24)
GFR calc Af Amer: >60 mL/min (ref >60)
GFR calc non Af Amer: >60 mL/min (ref >60)
Glucose, Bld: 79 mg/dL (ref 70–99)
Potassium: 3.2 mmol/L — ABNORMAL LOW (ref 3.5–5.1)
Sodium: 137 mmol/L (ref 135–145)
Total Bilirubin: 0.8 mg/dL (ref 0.3–1.2)
Total Protein: 5.8 g/dL — ABNORMAL LOW (ref 6.5–8.1)

## 2018-05-24 LAB — URINALYSIS, ROUTINE W REFLEX MICROSCOPIC
Bacteria, UA: NONE SEEN
Bilirubin Urine: NEGATIVE
Glucose, UA: NEGATIVE mg/dL
Hgb urine dipstick: NEGATIVE
Ketones, ur: 20 mg/dL — AB
Leukocytes,Ua: NEGATIVE
Nitrite: NEGATIVE
Protein, ur: NEGATIVE mg/dL
Specific Gravity, Urine: 1.018 (ref 1.005–1.030)
pH: 6 (ref 5.0–8.0)

## 2018-05-24 LAB — PROCALCITONIN: Procalcitonin: 0.36 ng/mL

## 2018-05-24 LAB — PHOSPHORUS: Phosphorus: 2.6 mg/dL (ref 2.5–4.6)

## 2018-05-24 LAB — PROTIME-INR
INR: 2.2 — ABNORMAL HIGH (ref 0.8–1.2)
Prothrombin Time: 23.7 seconds — ABNORMAL HIGH (ref 11.4–15.2)

## 2018-05-24 LAB — INTERLEUKIN-6, PLASMA: Interleukin-6, Plasma: 81 pg/mL — ABNORMAL HIGH (ref 0.0–12.2)

## 2018-05-24 LAB — C DIFFICILE QUICK SCREEN W PCR REFLEX
C Diff antigen: NEGATIVE
C Diff interpretation: NOT DETECTED
C Diff toxin: NEGATIVE

## 2018-05-24 LAB — FERRITIN: Ferritin: 127 ng/mL (ref 24–336)

## 2018-05-24 LAB — D-DIMER, QUANTITATIVE: D-Dimer, Quant: 0.78 ug/mL-FEU — ABNORMAL HIGH (ref 0.00–0.50)

## 2018-05-24 LAB — MAGNESIUM: Magnesium: 1.7 mg/dL (ref 1.7–2.4)

## 2018-05-24 LAB — C-REACTIVE PROTEIN: CRP: 14.2 mg/dL — ABNORMAL HIGH (ref <1.0)

## 2018-05-24 MED ORDER — HYDROCORTISONE 20 MG PO TABS
20.0000 mg | ORAL_TABLET | Freq: Every day | ORAL | Status: DC
  Administered 2018-05-24 – 2018-05-27 (×4): 20 mg via ORAL
  Filled 2018-05-24 (×4): qty 1

## 2018-05-24 MED ORDER — FUROSEMIDE 10 MG/ML IJ SOLN: 40.0000 mg | Freq: Two times a day (BID) | INTRAMUSCULAR | Status: DC

## 2018-05-24 MED ORDER — DIVALPROEX SODIUM 125 MG PO DR TAB
125.0000 mg | DELAYED_RELEASE_TABLET | Freq: Three times a day (TID) | ORAL | Status: DC
  Filled 2018-05-24 (×2): qty 1

## 2018-05-24 MED ORDER — DIVALPROEX SODIUM 125 MG PO CSDR
125.0000 mg | DELAYED_RELEASE_CAPSULE | Freq: Three times a day (TID) | ORAL | Status: DC
  Administered 2018-05-24 – 2018-06-05 (×31): 125 mg via ORAL
  Filled 2018-05-24 (×18): qty 1
  Filled 2018-05-24: qty 2
  Filled 2018-05-24 (×20): qty 1

## 2018-05-24 MED ORDER — HYDROCORTISONE 20 MG PO TABS: 20.0000 mg | ORAL_TABLET | Freq: Two times a day (BID) | ORAL | Status: DC

## 2018-05-24 MED ORDER — POTASSIUM CHLORIDE CRYS ER 20 MEQ PO TBCR
40.0000 meq | EXTENDED_RELEASE_TABLET | Freq: Two times a day (BID) | ORAL | Status: AC
  Administered 2018-05-24 (×2): 40 meq via ORAL
  Filled 2018-05-24 (×2): qty 2

## 2018-05-24 MED ORDER — HYDROCORTISONE 20 MG PO TABS
20.0000 mg | ORAL_TABLET | Freq: Every day | ORAL | Status: DC
  Administered 2018-05-25 – 2018-05-27 (×3): 20 mg via ORAL
  Filled 2018-05-24 (×5): qty 1

## 2018-05-24 MED ORDER — FUROSEMIDE 10 MG/ML IJ SOLN
40.0000 mg | Freq: Two times a day (BID) | INTRAMUSCULAR | Status: AC
  Administered 2018-05-24 (×2): 40 mg via INTRAVENOUS
  Filled 2018-05-24 (×2): qty 4

## 2018-05-24 MED ORDER — WARFARIN SODIUM 5 MG PO TABS
5.0000 mg | ORAL_TABLET | Freq: Once | ORAL | Status: AC
  Administered 2018-05-24: 5 mg via ORAL
  Filled 2018-05-24: qty 1

## 2018-05-24 MED ORDER — HYDRALAZINE HCL 50 MG PO TABS
25.0000 mg | ORAL_TABLET | Freq: Four times a day (QID) | ORAL | Status: DC | PRN
  Administered 2018-05-24 (×2): 25 mg via ORAL
  Filled 2018-05-24 (×2): qty 1

## 2018-05-24 MED ORDER — HYDROCORTISONE 10 MG PO TABS
10.0000 mg | ORAL_TABLET | Freq: Every day | ORAL | Status: DC
  Administered 2018-05-25 – 2018-05-27 (×3): 10 mg via ORAL
  Filled 2018-05-24 (×5): qty 1

## 2018-05-24 NOTE — Progress Notes (Signed)
[place] for warfarin Indication: VTE history   Assessment: 54 yom on warfarin PTA for PE/DVT history presenting with AMS, sepsis due to COVID positive. CVA work-up initiated. Pharmacy consulted to dose warfarin inpatient. INR therapeutic at 2.6 on admit. Noted remote hx of GIB, but no active bleed issues documented. CBC wnl, d-dimer 1.24 on admit.  INR today remains therapeutic at 2.2. H/H and Plt wnl.   PTA warfarin dose: 5mg  daily except 2.5mg  on Tues/Fri (last dose 4/26 PTA)  Goal of Therapy:  INR 2-3 Monitor platelets by anticoagulation protocol: Yes   Plan:  Warfarin 5mg  PO x 1 dose  Daily INR Monitor CBC, s/sx bleeding   Albertina Parr, PharmD., BCPS Clinical Pharmacist Clinical phone for 05/24/18 until 4:30pm: [phone] If after 4:30pm, please refer to [facility] for unit-specific pharmacist

## 2018-05-24 NOTE — Progress Notes (Addendum)
This RN came in to administer patients prescribed cefipime when patient began to have increased RR and he started c/o headache. Cycled patients BP with reading of 190/106. Pt also appeared to have more of a pronounced right sided facial droop. He can tell me his name and date of birth but will not answer any additional orientation questions. His pupils are equal and reactive at 3 mm and brisk. Grip is same as when admitted. Paged MD Purohit who gave rx for hydralazine. Will monitor patient

## 2018-05-24 NOTE — Progress Notes (Signed)
Pt placed in prone pt. Bed in lowest position. Call bell in reach.

## 2018-05-24 NOTE — Plan of Care (Signed)
  Problem: Education: Goal: Knowledge of General Education information will improve Description: Including pain rating scale, medication(s)/side effects and non-pharmacologic comfort measures Outcome: Progressing   Problem: Clinical Measurements: Goal: Ability to maintain clinical measurements within normal limits will improve Outcome: Progressing Goal: Will remain free from infection Outcome: Progressing   

## 2018-05-24 NOTE — Progress Notes (Signed)
[facility] PROGRESS NOTE    Progress Note  [name]  URK:[id number] DOB: February 11, 1940 DOA: 04/30/2018 PCP: Lawerance Cruel, MD     Brief Narrative:   [name] is an 78 y.o. male past medical history of diastolic heart failure, history of GI bleed, dementia anemia of chronic disease PE on Coumadin found to be lethargic facility on the day of admission.  Code stroke was called admission, TPA was not given he was found to be febrile hypotensive tachycardic and hypoxic in the ED CT of the head negative for stroke, CT Angie of the head and neck showed no significant stenosis. Patient was found to have COVID-19 positive PCR. Comes from home.  Assessment/Plan:   Sirs secondary to COVID-19: As per wife started on 4.26.2020. Continue nasal cannula 3 L try to keep saturations above 88%. Multiple risk factors for complications including but not limiting to age, male, essential hypertension diabetes mellitus type 2. We will KVO IV fluids. Blood pressures currently high heart rate has improved. The elevation of his lactic acid is likely due to hypoxia he is not septic. Check a pro calcitonin, he is currently on IV cefepime. COVID-19 markers: D-dimer: Is slowly improving. Ferritin: Is slightly rising CRP: Is slightly rising Continue to check COVID markers daily, will check a chest x-ray in the morning. We will continue IV cefepime The treatment plan and use of medications (Actemra) and known side effects were discussed with patient/family, they were clearly explained that there is no proven definitive treatment for COVID-19 infection, any medications used here are based on published clinical articles/anecdotal data which are not peer-reviewed or randomized control trials.  Complete risks and long-term side effects are unknown, however in the best clinical judgment they seem to be of some clinical benefit rather than medical risks.  Patient/family agree with the treatment  plan and want to receive the given medications.  Acute respiratory failure with hypoxia due to COVID-19 virus infection Chest x-ray showed possible haziness at the lower lung bases. Only on supplemental oxygen.  Saturations have been greater than 93% COVID-19 markers have remained high.  We will keep an eye on the CRP is a double in 24 hours, his oxygen requirements have remained stable over the last 24 hours.  Repeat a chest x-ray tomorrow morning. He was started on vitamin C and zinc.  Acute metabolic encephalopathy with right-sided weakness: MRI of the brain was negative for CVA. His right-sided weakness has resolved as per previous physician. Continue Coumadin per pharmacy. Encephalopathy likely due to infectious etiology. Continues to be slow to respond and inaccurate and is answers.  Adrenal insufficiency/Addison's disease: Given stress dose steroids.  His blood pressure seems to be stable.  He has not hyponatremic Hypothyroidism  Chronic diastolic heart failure: He is currently on Coreg and Lasix IV. He is positive about 2 L. We will increase his Lasix, restrict his fluids, monitor strict I's and O's and daily weights.  Diabetes mellitus type 2: A1c was 5.8. Only his insulin demand will increase as she was started on stress dose steroids. We will monitor CBGs before meals and at bedtime.  History of PE on Coumadin: Continue current medication.  Hypothyroidism: Continue Synthroid.  History of dementia: On Depakote, no history of seizures. 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  Ethics: Patient is currently full code, tomorrow about trying to make a move  DNR/DNI as if he is intubated his chances of being extubated and mortality are very low due to his multiple risk factors for COVID-19 which include limited to high blood pressure diabetes male and age Chronic kidney disease  stage III: Cr Seems to be at baseline.  Present on admission various pressure ulcer anywhere from stage II to stage III: Turn patient every 2 hours. RN Pressure Injury Documentation: Pressure Injury 12/31/17 Unstageable - Full thickness tissue loss in which the base of the ulcer is covered by slough (yellow, tan, gray, green or brown) and/or eschar (tan, brown or black) in the wound bed. 2 areas of unstageable pressure injuries (Active)  12/31/17 1451  Location: Hip  Location Orientation: Right  Staging: Unstageable - Full thickness tissue loss in which the base of the ulcer is covered by slough (yellow, tan, gray, green or brown) and/or eschar (tan, brown or black) in the wound bed.  Wound Description (Comments): 2 areas of unstageable pressure injuries  Present on Admission: Yes     Pressure Injury 12/31/17 Stage III -  Full thickness tissue loss. Subcutaneous fat may be visible but bone, tendon or muscle are NOT exposed. this is a full thickness wound, NOT a pressure injury (Active)  12/31/17 1452  Location: Foot  Location Orientation: Left  Staging: Stage III -  Full thickness tissue loss. Subcutaneous fat may be visible but bone, tendon or muscle are NOT exposed.  Wound Description (Comments): this is a full thickness wound, NOT a pressure injury  Present on Admission: Yes     Pressure Injury 12/31/17 Stage II -  Partial thickness loss of dermis presenting as a shallow open ulcer with a red, pink wound bed without slough. these are partial thickness wounds, NOT pressure injuries (Active)  12/31/17 1453  Location: Back  Location Orientation: Right  Staging: Stage II -  Partial thickness loss of dermis presenting as a shallow open ulcer with a red, pink wound bed without slough.  Wound Description (Comments): these are partial thickness wounds, NOT pressure injuries  Present on Admission: Yes    Estimated body mass index is 28.31 kg/m as calculated from the following:   Height as  of this encounter: 5\' 10"  (1.778 m).   Weight as of this encounter: 89.5 kg.   DVT prophylaxis: lovenox Family Communication: I have spoken to his wife over the phone Disposition Plan/Barrier to D/C: Unable to determine Code Status:     Code Status Orders  (From admission, onward)         Start     Ordered   05/16/2018 1722  Full code  Continuous     04/28/2018 1725        Code Status History    Date Active Date Inactive Code Status Order ID Comments User Context   12/31/2017 1647 01/10/2018 1927 Full Code [id number]  Merton Border, MD Inpatient   11/28/2017 1734 12/01/2017 2028 Full Code [id number]  Karmen Bongo, MD ED   07/07/2017 1512 07/11/2017 1650 Full Code [id number]  Radene Gunning, NP ED   04/23/2017 0130 04/25/2017 1858 Full Code [id number]  Jani Gravel, MD ED   01/21/2017 1535 01/25/2017 1702 Full Code [id number]  Debbe Odea, MD ED   11/24/2015 1106 11/24/2015 1901 Full Code [id number]  Jolaine Artist, MD Inpatient   04/16/2015 1122 04/18/2015 1439 Full Code [id number]  Radene Gunning, NP ED   08/21/2014 1219 08/22/2014 1547 Full Code [id number]  Wellington Hampshire, MD Inpatient   08/20/2014 1339  08/21/2014 1219 Full Code [id number]  Georgena Spurling Inpatient   08/20/2014 1027 08/20/2014 1339 Full Code [id number]  Jolaine Artist, MD Inpatient   05/11/2014 0344 05/13/2014 2141 Full Code [id number]  Deneise Lever, MD ED   04/11/2013 1509 04/12/2013 1643 Full Code [id number]  Charlynne Cousins, MD Inpatient        IV Access:    Peripheral IV   Procedures and diagnostic studies:   Ct Angio Head W Or Wo Contrast  Result Date: 05/09/2018 CLINICAL DATA:  RIGHT-sided weakness. Altered mental status. Facial droop. EXAM: CT ANGIOGRAPHY HEAD AND NECK TECHNIQUE: Multidetector CT imaging of the head and neck was performed using the standard protocol during bolus administration of intravenous contrast. Multiplanar CT image reconstructions and MIPs were obtained to evaluate the  vascular anatomy. Carotid stenosis measurements (when applicable) are obtained utilizing NASCET criteria, using the distal internal carotid diameter as the denominator. CONTRAST:  3mL OMNIPAQUE IOHEXOL 350 MG/ML SOLN COMPARISON:  Code stroke CT earlier in the day. FINDINGS: CTA NECK FINDINGS Aortic arch: Standard branching. Imaged portion shows no evidence of aneurysm or dissection. No significant stenosis of the major arch vessel origins. Aortic atherosclerosis. Right carotid system: No evidence of dissection, stenosis (50% or greater) or occlusion. Moderate calcific and soft plaque at the bifurcation. Left carotid system: Slightly greater than 50% irregular stenosis, calcific and soft plaque, at the RIGHT internal carotid artery origin. This is based on luminal measurements of 1.9/4.1 proximal/distal. No dissection. No intraluminal thrombus. Vertebral arteries: Nonstenotic calcific plaque at both vertebral origins. Both vessels are patent through the neck, codominant. Skeleton: Spondylosis. No worrisome osseous lesion. Other neck: No neck masses. Upper chest: Incompletely visualized cloudlike airspace opacity RIGHT upper lobe posterior segment. Small RIGHT pleural effusion. Review of the MIP images confirms the above findings CTA HEAD FINDINGS Anterior circulation: No significant stenosis, proximal occlusion, aneurysm, or vascular malformation. Nonstenotic atheromatous change both carotid siphons. No M1 or M2 disease of significance. Dominant RIGHT A1 ACA. Posterior circulation: No significant stenosis, proximal occlusion, aneurysm, or vascular malformation. Venous sinuses: As permitted by contrast timing, patent. Anatomic variants: None of significance. Delayed phase: Not performed. Review of the MIP images confirms the above findings IMPRESSION: 1. Slightly greater than 50% stenosis, calcific and soft plaque at the LEFT internal carotid artery origin. This is non flow reducing. 2. No intracranial stenosis or  vascular occlusion. 3. Incompletely visualized cloudy airspace opacity RIGHT upper lobe posterior segment, which could represent pneumonia. Small RIGHT pleural effusion. 4. These results were communicated to Dr. Lorraine Lax at [place] 04/30/2020by text page via the [facility] messaging system. Electronically Signed   By: Staci Righter M.D.   On: 05/13/2018 13:20   Ct Angio Neck W Or Wo Contrast  Result Date: 05/22/2018 CLINICAL DATA:  RIGHT-sided weakness. Altered mental status. Facial droop. EXAM: CT ANGIOGRAPHY HEAD AND NECK TECHNIQUE: Multidetector CT imaging of the head and neck was performed using the standard protocol during bolus administration of intravenous contrast. Multiplanar CT image reconstructions and MIPs were obtained to evaluate the vascular anatomy. Carotid stenosis measurements (when applicable) are obtained utilizing NASCET criteria, using the distal internal carotid diameter as the denominator. CONTRAST:  27mL OMNIPAQUE IOHEXOL 350 MG/ML SOLN COMPARISON:  Code stroke CT earlier in the day. FINDINGS: CTA NECK FINDINGS Aortic arch: Standard branching. Imaged portion shows no evidence of aneurysm or dissection. No significant stenosis of the major arch vessel origins. Aortic atherosclerosis. Right carotid system: No evidence of dissection, stenosis (50% or  greater) or occlusion. Moderate calcific and soft plaque at the bifurcation. Left carotid system: Slightly greater than 50% irregular stenosis, calcific and soft plaque, at the RIGHT internal carotid artery origin. This is based on luminal measurements of 1.9/4.1 proximal/distal. No dissection. No intraluminal thrombus. Vertebral arteries: Nonstenotic calcific plaque at both vertebral origins. Both vessels are patent through the neck, codominant. Skeleton: Spondylosis. No worrisome osseous lesion. Other neck: No neck masses. Upper chest: Incompletely visualized cloudlike airspace opacity RIGHT upper lobe posterior segment. Small RIGHT pleural  effusion. Review of the MIP images confirms the above findings CTA HEAD FINDINGS Anterior circulation: No significant stenosis, proximal occlusion, aneurysm, or vascular malformation. Nonstenotic atheromatous change both carotid siphons. No M1 or M2 disease of significance. Dominant RIGHT A1 ACA. Posterior circulation: No significant stenosis, proximal occlusion, aneurysm, or vascular malformation. Venous sinuses: As permitted by contrast timing, patent. Anatomic variants: None of significance. Delayed phase: Not performed. Review of the MIP images confirms the above findings IMPRESSION: 1. Slightly greater than 50% stenosis, calcific and soft plaque at the LEFT internal carotid artery origin. This is non flow reducing. 2. No intracranial stenosis or vascular occlusion. 3. Incompletely visualized cloudy airspace opacity RIGHT upper lobe posterior segment, which could represent pneumonia. Small RIGHT pleural effusion. 4. These results were communicated to Dr. Lorraine Lax at [place] 04/05/2020by text page via the [facility] messaging system. Electronically Signed   By: Staci Righter M.D.   On: 05/04/2018 13:20   Mr Brain Wo Contrast  Result Date: 05/23/2018 CLINICAL DATA:  RIGHT-sided weakness now resolved. Altered mental status, possible encephalopathy. COVID-19 positive. EXAM: MRI HEAD WITHOUT CONTRAST TECHNIQUE: Multiplanar, multiecho pulse sequences of the brain and surrounding structures were obtained without intravenous contrast. COMPARISON:  CTA head neck 05/06/2018. FINDINGS: Brain: No evidence for acute infarction, hemorrhage, mass lesion, hydrocephalus, or extra-axial fluid. Generalized atrophy. Extensive T2 and FLAIR hyperintensities throughout the white matter, likely small vessel disease. Vascular: Normal flow voids. Skull and upper cervical spine: Normal marrow signal. Sinuses/Orbits: Chronic RIGHT maxillary sinus disease. BILATERAL ethmoid mucosal thickening. Negative orbits. Other: None. IMPRESSION: Atrophy  and small vessel disease.  No acute intracranial findings. Electronically Signed   By: Staci Righter M.D.   On: 05/23/2018 16:20   Dg Chest Portable 1 View  Result Date: 04/30/2018 CLINICAL DATA:  78 year old male with altered mental status, lethargic, wheezing EXAM: PORTABLE CHEST 1 VIEW COMPARISON:  Prior chest x-ray 12/31/2017 FINDINGS: Lower inspiratory volumes with minimal bibasilar atelectasis. No evidence of pulmonary edema, focal airspace consolidation, pleural effusion or pneumothorax. Stable cardiac and mediastinal contours. Atherosclerotic calcifications visualized in the transverse aorta. No acute osseous abnormality. Surgical clips in the right upper quadrant suggest prior cholecystectomy. IMPRESSION: Low inspiratory volumes with bibasilar atelectasis. Otherwise, no acute cardiopulmonary process. Electronically Signed   By: Jacqulynn Cadet M.D.   On: 05/21/2018 13:33   Ct Head Code Stroke Wo Contrast  Result Date: 05/22/2018 CLINICAL DATA:  Code stroke. RIGHT-sided weakness, altered mental status with facial droop. EXAM: CT HEAD WITHOUT CONTRAST TECHNIQUE: Contiguous axial images were obtained from the base of the skull through the vertex without intravenous contrast. COMPARISON:  04/22/2017. FINDINGS: Brain: No evidence for acute infarction, hemorrhage, mass lesion, hydrocephalus, or extra-axial fluid. Generalized atrophy. T2 and FLAIR hyperintensities throughout the white matter, consistent with small vessel disease. Vascular: Calcification of the cavernous internal carotid arteries consistent with cerebrovascular atherosclerotic disease. No signs of intracranial large vessel occlusion. Skull: Normal. Negative for fracture or focal lesion. Sinuses/Orbits: Chronic RIGHT maxillary sinusitis. Negative  orbits. Other: None. ASPECTS [facility] Stroke Program Early CT Score) - Ganglionic level infarction (caudate, lentiform nuclei, internal capsule, insula, M1-M3 cortex): 7 - Supraganglionic  infarction (M4-M6 cortex): 3 Total score (0-10 with 10 being normal): 10 IMPRESSION: 1. Atrophy and small vessel disease. No acute intracranial findings. 2. ASPECTS is 10. 3. These results were communicated to Dr. Lorraine Lax at 1:07 [place] 04/30/2020by text page via the [facility] messaging system. * Electronically Signed   By: Staci Righter M.D.   On: 05/25/2018 13:09     Medical Consultants:    None.  Anti-Infectives:   IV vancomycin and cefepime  Subjective:    Leron R Gutkowski he relates his breathing is slightly better.  Objective:    Vitals:   05/24/18 0431 05/24/18 0500 05/24/18 0600 05/24/18 0700  BP:  (!) 170/97 (!) 159/82   Pulse:  (!) 101 86 100  Resp:  (!) 28 (!) 22 (!) 29  Temp: (!) 97.3 F (36.3 C)     TempSrc: Oral     SpO2:  95% 95% 95%  Weight:  89.5 kg    Height:        Intake/Output Summary (Last 24 hours) at 05/24/2018 0719 Last data filed at 05/24/2018 0500 Gross per 24 hour  Intake 1554.23 ml  Output 1050 ml  Net 504.23 ml   Filed Weights   05/23/18 0402 05/23/18 2300 05/24/18 0500  Weight: 101 kg 89.5 kg 89.5 kg    Exam: General exam: No acute distress Respiratory system: Good air movement and clear to auscultation. Cardiovascular system: Regular rate and rhythm with positive S1-S2. Gastrointestinal system: Bowel sounds soft nontender nondistended. Central nervous system: He is awake alert and oriented x3 nonfocal.  Slow to responsed Extremities: No lower extremity edema Skin: No rashes, lesions or ulcers Psychiatry: Judgment and insight appear normal mood and affect are appropriate    Data Reviewed:    Labs: Basic Metabolic Panel: Recent Labs  Lab 05/03/2018 1208 05/05/2018 1236 05/23/18 0457  NA 139  --  139  K 3.8  --  3.9  CL 102  --  110  CO2 26  --  23  GLUCOSE 86  --  66*  BUN 18  --  18  CREATININE 1.34* 1.20 1.23  CALCIUM 8.7*  --  7.9*  MG  --   --  1.9  PHOS  --   --  3.7   GFR Estimated Creatinine Clearance: 56.6 mL/min  (by C-G formula based on SCr of 1.23 mg/dL). Liver Function Tests: Recent Labs  Lab 05/01/2018 1208 05/23/18 0457  AST 20 20  ALT 21 22  ALKPHOS 76 64  BILITOT 0.8 0.7  PROT 5.8* 5.3*  ALBUMIN 2.8* 2.5*   No results for input(s): LIPASE, AMYLASE in the last 168 hours. No results for input(s): AMMONIA in the last 168 hours. Coagulation profile Recent Labs  Lab 05/02/2018 1208 05/23/18 0457  INR 2.6* 2.5*    CBC: Recent Labs  Lab 05/07/2018 1208 05/23/18 0457  WBC 11.7* 11.3*  NEUTROABS 8.4* 7.1  HGB 14.7 13.6  HCT 46.3 42.1  MCV 91.1 89.6  PLT 229 203   Cardiac Enzymes: Recent Labs  Lab 05/23/18 0457  CKTOTAL 62   BNP (last 3 results) No results for input(s): PROBNP in the last 8760 hours. CBG: Recent Labs  Lab 05/23/18 1202 05/23/18 1706 05/23/18 2107 05/24/18 0035 05/24/18 0342  GLUCAP 79 87 143* 83 95   D-Dimer: Recent Labs    05/22/18 1208  05/23/18 0457  DDIMER 1.24* 1.03*   Hgb A1c: Recent Labs    05/23/18 0457  HGBA1C 5.8*   Lipid Profile: Recent Labs    05/13/2018 1210 05/23/18 0457  CHOL  --  125  HDL  --  35*  LDLCALC  --  76  TRIG 105 71   70  CHOLHDL  --  3.6   Thyroid function studies: No results for input(s): TSH, T4TOTAL, T3FREE, THYROIDAB in the last 72 hours.  Invalid input(s): FREET3 Anemia work up: Recent Labs    05/20/2018 1208 05/23/18 0457  FERRITIN 49 85   Sepsis Labs: Recent Labs  Lab 05/07/2018 1208 05/21/2018 1920 05/23/18 0457  PROCALCITON 0.15  --   --   WBC 11.7*  --  11.3*  LATICACIDVEN 1.9 1.6  --    Microbiology Recent Results (from the past 240 hour(s))  SARS Coronavirus 2 [facility] order, Performed in [place] hospital lab)     Status: Abnormal   Collection Time: 05/02/2018 12:10 PM  Result Value Ref Range Status   SARS Coronavirus 2 POSITIVE (A) NEGATIVE Final    Comment: RESULT CALLED TO, READ BACK BY AND VERIFIED WITH: RN Lowell Bouton [phone] (NOTE) If result is NEGATIVE SARS-CoV-2  target nucleic acids are NOT DETECTED. The SARS-CoV-2 RNA is generally detectable in upper and lower  respiratory specimens during the acute phase of infection. The lowest  concentration of SARS-CoV-2 viral copies this assay can detect is 250  copies / mL. A negative result does not preclude SARS-CoV-2 infection  and should not be used as the sole basis for treatment or other  patient management decisions.  A negative result may occur with  improper specimen collection / handling, submission of specimen other  than nasopharyngeal swab, presence of viral mutation(s) within the  areas targeted by this assay, and inadequate number of viral copies  (<250 copies / mL). A negative result must be combined with clinical  observations, patient history, and epidemiological information. If result is POSITIVE SARS-CoV-2 target nucleic acids are DETECTED. The SARS-Co V-2 RNA is generally detectable in upper and lower  respiratory specimens during the acute phase of infection.  Positive  results are indicative of active infection with SARS-CoV-2.  Clinical  correlation with patient history and other diagnostic information is  necessary to determine patient infection status.  Positive results do  not rule out bacterial infection or co-infection with other viruses. If result is PRESUMPTIVE POSTIVE SARS-CoV-2 nucleic acids MAY BE PRESENT.   A presumptive positive result was obtained on the submitted specimen  and confirmed on repeat testing.  While 2019 novel coronavirus  (SARS-CoV-2) nucleic acids may be present in the submitted sample  additional confirmatory testing may be necessary for epidemiological  and / or clinical management purposes  to differentiate between  SARS-CoV-2 and other Sarbecovirus currently known to infect humans.  If clinically indicated additional testing with an alternate test  methodology [phone]) is advised.  The SARS-CoV-2 RNA is generally  detectable in upper and lower  respiratory specimens during the acute  phase of infection. The expected result is Negative. Fact Sheet for Patients:  StrictlyIdeas.no Fact Sheet for Healthcare Providers: BankingDealers.co.za This test is not yet approved or cleared by the [place] FDA and has been authorized for detection and/or diagnosis of SARS-CoV-2 by FDA under an Emergency Use Authorization (EUA).  This EUA will remain in effect (meaning this test can be used) for the duration of the COVID-19 declaration under Section  564(b)(1) of the Act, 21 U.S.C. section 360bbb-3(b)(1), unless the authorization is terminated or revoked sooner. Performed at [facility], [place] [address], [place], [place] [address]   Blood Culture (routine x 2)     Status: None (Preliminary result)   Collection Time: 04/26/2018 12:16 PM  Result Value Ref Range Status   Specimen Description BLOOD RIGHT ANTECUBITAL  Final   Special Requests   Final    BOTTLES DRAWN AEROBIC AND ANAEROBIC Blood Culture adequate volume   Culture   Final    NO GROWTH < 24 HOURS Performed at [facility], [place] [address], [place], [place] [address]    Report Status PENDING  Incomplete  Blood Culture (routine x 2)     Status: None (Preliminary result)   Collection Time: 05/16/2018  6:42 PM  Result Value Ref Range Status   Specimen Description BLOOD LEFT ANTECUBITAL  Final   Special Requests   Final    BOTTLES DRAWN AEROBIC ONLY Blood Culture results may not be optimal due to an inadequate volume of blood received in culture bottles   Culture   Final    NO GROWTH < 24 HOURS Performed at [facility], [place] [address], [place], [place] [address]    Report Status PENDING  Incomplete  MRSA PCR Screening     Status: None   Collection Time: 05/23/18 10:45 AM  Result Value Ref Range Status   MRSA by PCR NEGATIVE NEGATIVE Final    Comment:        The GeneXpert MRSA Assay (FDA approved for NASAL  specimens only), is one component of a comprehensive MRSA colonization surveillance program. It is not intended to diagnose MRSA infection nor to guide or monitor treatment for MRSA infections. Performed at [facility], [place] [address], [place], [place] [address]      Medications:    albuterol  2 puff Inhalation Q6H   azithromycin  500 mg Oral Daily   carvedilol  6.25 mg Oral BID WC   cholecalciferol  1,000 Units Oral Daily   vitamin C  500 mg Oral Daily   Warfarin - Pharmacist Dosing Inpatient   Does not apply q1800   zinc sulfate  220 mg Oral Daily   Continuous Infusions:  sodium chloride     ceFEPime (MAXIPIME) IV Stopped (05/24/18 0332)      LOS: 2 days   Charlynne Cousins  [facility]  05/24/2018, 7:19 AM

## 2018-05-25 ENCOUNTER — Inpatient Hospital Stay (HOSPITAL_COMMUNITY): Payer: Medicare HMO

## 2018-05-25 LAB — COMPREHENSIVE METABOLIC PANEL
ALT: 26 U/L (ref 0–44)
AST: 28 U/L (ref 15–41)
Albumin: 2.7 g/dL — ABNORMAL LOW (ref 3.5–5.0)
Alkaline Phosphatase: 65 U/L (ref 38–126)
Anion gap: 12 (ref 5–15)
BUN: 22 mg/dL (ref 8–23)
CO2: 17 mmol/L — ABNORMAL LOW (ref 22–32)
Calcium: 7.9 mg/dL — ABNORMAL LOW (ref 8.9–10.3)
Chloride: 109 mmol/L (ref 98–111)
Creatinine, Ser: 1.26 mg/dL — ABNORMAL HIGH (ref 0.61–1.24)
GFR calc Af Amer: >60 mL/min (ref >60)
GFR calc non Af Amer: 55 mL/min — ABNORMAL LOW (ref >60)
Glucose, Bld: 98 mg/dL (ref 70–99)
Potassium: 3.3 mmol/L — ABNORMAL LOW (ref 3.5–5.1)
Sodium: 138 mmol/L (ref 135–145)
Total Bilirubin: 0.8 mg/dL (ref 0.3–1.2)
Total Protein: 6.1 g/dL — ABNORMAL LOW (ref 6.5–8.1)

## 2018-05-25 LAB — CBC WITH DIFFERENTIAL/PLATELET
Abs Immature Granulocytes: 0.09 10*3/uL — ABNORMAL HIGH (ref 0.00–0.07)
Basophils Absolute: 0 10*3/uL (ref 0.0–0.1)
Basophils Relative: 0 %
Eosinophils Absolute: 0 10*3/uL (ref 0.0–0.5)
Eosinophils Relative: 0 %
HCT: 43.3 % (ref 39.0–52.0)
Hemoglobin: 13.9 g/dL (ref 13.0–17.0)
Immature Granulocytes: 1 %
Lymphocytes Relative: 18 %
Lymphs Abs: 2 10*3/uL (ref 0.7–4.0)
MCH: 28.4 pg (ref 26.0–34.0)
MCHC: 32.1 g/dL (ref 30.0–36.0)
MCV: 88.5 fL (ref 80.0–100.0)
Monocytes Absolute: 1.1 10*3/uL — ABNORMAL HIGH (ref 0.1–1.0)
Monocytes Relative: 10 %
Neutro Abs: 8 10*3/uL — ABNORMAL HIGH (ref 1.7–7.7)
Neutrophils Relative %: 71 %
Platelets: 241 10*3/uL (ref 150–400)
RBC: 4.89 MIL/uL (ref 4.22–5.81)
RDW: 16.6 % — ABNORMAL HIGH (ref 11.5–15.5)
WBC: 11.2 10*3/uL — ABNORMAL HIGH (ref 4.0–10.5)
nRBC: 0 % (ref 0.0–0.2)

## 2018-05-25 LAB — C-REACTIVE PROTEIN: CRP: 15.9 mg/dL — ABNORMAL HIGH (ref <1.0)

## 2018-05-25 LAB — PROTIME-INR
INR: 2.2 — ABNORMAL HIGH (ref 0.8–1.2)
Prothrombin Time: 24 seconds — ABNORMAL HIGH (ref 11.4–15.2)

## 2018-05-25 LAB — PROCALCITONIN: Procalcitonin: 0.92 ng/mL

## 2018-05-25 LAB — MAGNESIUM: Magnesium: 1.7 mg/dL (ref 1.7–2.4)

## 2018-05-25 LAB — D-DIMER, QUANTITATIVE: D-Dimer, Quant: 0.8 ug/mL-FEU — ABNORMAL HIGH (ref 0.00–0.50)

## 2018-05-25 LAB — FERRITIN: Ferritin: 161 ng/mL (ref 24–336)

## 2018-05-25 MED ORDER — SODIUM BICARBONATE 650 MG PO TABS
650.0000 mg | ORAL_TABLET | Freq: Two times a day (BID) | ORAL | Status: AC
  Administered 2018-05-25 – 2018-05-26 (×4): 650 mg via ORAL
  Filled 2018-05-25 (×4): qty 1

## 2018-05-25 MED ORDER — PSYLLIUM 95 % PO PACK
1.0000 | PACK | Freq: Every day | ORAL | Status: DC
  Administered 2018-05-25 – 2018-06-03 (×10): 1 via ORAL
  Filled 2018-05-25 (×12): qty 1

## 2018-05-25 MED ORDER — WARFARIN SODIUM 5 MG PO TABS
5.0000 mg | ORAL_TABLET | ORAL | Status: DC
  Administered 2018-05-25: 17:00:00 5 mg via ORAL
  Filled 2018-05-25: qty 1

## 2018-05-25 MED ORDER — WARFARIN SODIUM 2.5 MG PO TABS: 2.5000 mg | ORAL_TABLET | ORAL | Status: DC

## 2018-05-25 MED ORDER — POTASSIUM CHLORIDE CRYS ER 20 MEQ PO TBCR: 40.0000 meq | EXTENDED_RELEASE_TABLET | Freq: Once | ORAL | Status: DC

## 2018-05-25 MED ORDER — VANCOMYCIN HCL 10 G IV SOLR
1750.0000 mg | INTRAVENOUS | Status: DC
  Administered 2018-05-25 – 2018-05-27 (×3): 1750 mg via INTRAVENOUS
  Filled 2018-05-25 (×4): qty 1750

## 2018-05-25 MED ORDER — POTASSIUM CHLORIDE 20 MEQ/15ML (10%) PO SOLN
40.0000 meq | Freq: Once | ORAL | Status: AC
  Administered 2018-05-25: 40 meq via ORAL
  Filled 2018-05-25: qty 30

## 2018-05-25 MED ORDER — ALBUTEROL SULFATE HFA 108 (90 BASE) MCG/ACT IN AERS
2.0000 | INHALATION_SPRAY | Freq: Four times a day (QID) | RESPIRATORY_TRACT | Status: DC | PRN
  Filled 2018-05-25: qty 6.7

## 2018-05-25 NOTE — Progress Notes (Signed)
[facility] NOTE  Pharmacy Consult for warfarin; Vancomycin/cefepime Indication: VTE history ; Pnuemonia    Assessment: 89 yom on warfarin PTA for PE/DVT history presenting with AMS, sepsis due to COVID positive. CVA work-up initiated.   AC: Pharmacy consulted to dose warfarin inpatient. INR therapeutic at 2.6 on admit. Noted remote hx of GIB, but no active bleed issues documented. CBC wnl, d-dimer 1.24 on admit.  INR today remains therapeutic at 2.2. H/H and Plt wnl.   PTA warfarin dose: 5mg  daily except 2.5mg  on Tues/Fri (last dose 4/26 PTA)  ID: Patient is on D#4 of Cefepime for pneumonia with persistently elevated WBC and PCT trending up. Now adding vancomycin. Afebrile.   Goal of Therapy:  INR 2-3 Monitor platelets by anticoagulation protocol: Yes   Plan:  Start Vancomycin 1750 mg IV Q 24 hrs. Goal AUC 400-550. Expected AUC: 535 SCr used: 1.26 Vancomycin peak/trough as indicated   Resume home warfarin regimen  Daily INR Monitor CBC, s/sx bleeding   Albertina Parr, PharmD., BCPS Clinical Pharmacist Clinical phone for 05/25/18 until 4pm: [phone] If after 4pm, please refer to [facility] for unit-specific pharmacist

## 2018-05-25 NOTE — Consult Note (Signed)
[facility] Nurse wound consult note Patient COVID 19 positive.  WOC consult for  Reason for Consult:"See progress note for tubal ulcers age 78 and 3"  I spoke with the patient's primary RN via telephone at [phone].  She was unaware of the consult and is not sure where the areas of concerns are.  She will be available at 2 p.m. today to attempt to camera in to view the areas of concern.  Val Riles, RN, MSN, CWOCN, CNS-BC, pager [phone]

## 2018-05-25 NOTE — Progress Notes (Signed)
Physical Therapy Treatment Patient Details Name: [name] MRN: [id number] DOB: 1940-04-11 Today's Date: 05/25/2018    History of Present Illness 78 yo male presenting from SNF with AMS. Tested COVID positive. PMH including CHF, h/o GI bleeding, Addison's disease, dementia, anemia chronic disease, and pulmonary embolus.     PT Comments    Patient assisted to sitting with total assist ,patient is very stiff, keeps hands clinched but able to open. Noted jerking of the right leg at times. Per  Wife via RN, patient was not mobile without assist  At the SNF. Currently requires a mechanical lift OOB. Patient may be at his max potential of skilled PT. Will reassess skilled needs  in near future.  Follow Up Recommendations  SNF;Supervision/Assistance - 24 hour     Equipment Recommendations  None recommended by PT    Recommendations for Other Services       Precautions / Restrictions Precautions Precautions: Fall Precaution Comments: flexiseal    Mobility  Bed Mobility   Bed Mobility: Rolling;Sidelying to Sit;Sit to Supine;Sit to Sidelying Rolling: Max assist;+2 for physical assistance Sidelying to sit: +2 for physical assistance;Max assist     Sit to sidelying: Max assist;+2 for physical assistance;+2 for safety/equipment General bed mobility comments: Pt requiring Max A to roll, total assist to  to sit up and back to into bed  Transfers                    Ambulation/Gait                 Stairs             Wheelchair Mobility    Modified Rankin (Stroke Patients Only)       Balance Overall balance assessment: Needs assistance Sitting-balance support: Single extremity supported;Feet supported Sitting balance-Leahy Scale: Zero   Postural control: Posterior lean;Right lateral lean;Left lateral lean                                  Cognition Arousal/Alertness: Awake/alert Behavior During Therapy: Flat affect   Area of  Impairment: Orientation;Memory;Following commands;Problem solving                 Orientation Level: Disoriented to;Place;Time;Situation           Problem Solving: Slow processing;Decreased initiation General Comments: Baseline dementia. Pt following simple commands and agreeable to therapy. When assisted to sit midline, pt stated " Let Me do it" and then tris to lean to left asif to lie down      Exercises      General Comments        Pertinent Vitals/Pain Pain Assessment: Faces Faces Pain Scale: Hurts a little bit Pain Location: when mobilizing Pain Descriptors / Indicators: Grimacing;Moaning Pain Intervention(s): Monitored during session;Repositioned    Home Living                      Prior Function            PT Goals (current goals can now be found in the care plan section) Progress towards PT goals: Progressing toward goals    Frequency    Min 2X/week      PT Plan      Co-evaluation PT/OT/SLP Co-Evaluation/Treatment: Yes Reason for Co-Treatment: For patient/therapist safety PT goals addressed during session: Mobility/safety with mobility OT goals addressed during session: ADL's and self-care  AM-PAC PT "6 Clicks" Mobility   Outcome Measure  Help needed turning from your back to your side while in a flat bed without using bedrails?: Total Help needed moving from lying on your back to sitting on the side of a flat bed without using bedrails?: Total Help needed moving to and from a bed to a chair (including a wheelchair)?: Total Help needed standing up from a chair using your arms (e.g., wheelchair or bedside chair)?: Total Help needed to walk in hospital room?: Total Help needed climbing 3-5 steps with a railing? : Total 6 Click Score: 6    End of Session Equipment Utilized During Treatment: Oxygen Activity Tolerance: Patient tolerated treatment well Patient left: in bed;with call bell/phone within reach;with bed alarm  set Nurse Communication: Mobility status;Need for lift equipment PT Visit Diagnosis: Unsteadiness on feet (R26.81);Muscle weakness (generalized) (M62.81);Difficulty in walking, not elsewhere classified (R26.2)     Time: 6578-4696 PT Time Calculation (min) (ACUTE ONLY): 25 min  Charges:  $Therapeutic Activity: 8-22 mins                     Tresa Endo PT Acute Rehabilitation Services Pager [phone] Office [phone]    Claretha Cooper 05/25/2018, 3:44 PM

## 2018-05-25 NOTE — Progress Notes (Signed)
[facility] PROGRESS NOTE    Progress Note  [name]  SWN:[id number] DOB: 1940/09/24 DOA: 05/12/2018 PCP: Lawerance Cruel, MD     Brief Narrative:   [name] is an 78 y.o. male past medical history of diastolic heart failure, history of GI bleed, dementia anemia of chronic disease PE on Coumadin found to be lethargic facility on the day of admission.  Code stroke was called admission, TPA was not given he was found to be febrile hypotensive tachycardic and hypoxic in the ED CT of the head negative for stroke, CT Angie of the head and neck showed no significant stenosis. Patient was found to have COVID-19 positive PCR. Comes from home.  Assessment/Plan:   Sirs secondary to COVID-19: As per wife started on 4.26.2020. Multiple risk factors for complications including but not limiting to age, male, essential hypertension diabetes mellitus type 2. Continue to [facility] IV fluids. His procalcitonin is rising is currently 1 he is currently on IV cefepime, will add IV vancomycin, white count is persistently rising. COVID-19 markers: D-dimer: Is slowly improving, today 0.8. Ferritin: Continues to slowly rise. CRP: Is slightly rising 5.2>12.2>14.2>15.9 I have personally reviewed his chest x-ray shows no acute cardiopulmonary disease.  Acute respiratory failure with hypoxia due to COVID-19 virus infection He is still on 2 to 3 L of oxygen, saturations have been greater than 93% We will keep an eye on the CRP. COVID-19 markers: D-dimer: Is slowly improving. Ferritin: Is slightly rising CRP: Is slightly rising 5.2>12.2>14.2>15.9 CRP continues to rise, his oxygen requirements have remained stable, his blood pressure and pulse have improved significantly compared to admission chest x-ray looks unchanged compared to previous.  Acute metabolic encephalopathy with right-sided weakness: MRI of the brain was negative for CVA. Continue Coumadin per pharmacy. Encephalopathy  likely due to infectious etiology.  Adrenal insufficiency/Addison's disease: Continue his current home dose.  Chronic diastolic heart failure: He is currently on Coreg and Lasix IV. He is positive about 2 L. We will increase his Lasix, restrict his fluids, monitor strict I's and O's and daily weights.  Diabetes mellitus type 2: A1c was 5.8. Blood glucose is fairly controlled, will continue sliding scale insulin.  History of PE on Coumadin: Continue current medication. Is currently therapeutic.  Hypothyroidism: Continue Synthroid.  History of dementia: No events overnight continue to oral Depakote.  New onset Diarrhea: His C. difficile PCR was negative, there is likely due to antibiotics. He has resolved he currently has a Flexi-Seal.  New non-anion gap metabolic acidosis: Likely due to diarrhea start him on bicarbonate tablets l and bulking agents.  Ethics: Patient is currently full code, spoke with wife today, she relates she would like to do her sons before making a decision.  Chronic kidney disease stage III: There has been a mild increase in his creatinine, will stop IV Lasix. Liberalize his diet.  Present on admission various pressure ulcer anywhere from stage II to stage III: Turn patient every 2 hours.  RN Pressure Injury Documentation: Pressure Injury 12/31/17 Unstageable - Full thickness tissue loss in which the base of the ulcer is covered by slough (yellow, tan, gray, green or brown) and/or eschar (tan, brown or black) in the wound bed. 2 areas of unstageable pressure injuries (Active)  12/31/17 1451  Location: Hip  Location Orientation: Right  Staging: Unstageable - Full thickness tissue loss in which the base of the ulcer is covered by slough (yellow, tan, gray, green or brown) and/or eschar (tan, brown or black)  in the wound bed.  Wound Description (Comments): 2 areas of unstageable pressure injuries  Present on Admission: Yes     Pressure Injury  12/31/17 Stage III -  Full thickness tissue loss. Subcutaneous fat may be visible but bone, tendon or muscle are NOT exposed. this is a full thickness wound, NOT a pressure injury (Active)  12/31/17 1452  Location: Foot  Location Orientation: Left  Staging: Stage III -  Full thickness tissue loss. Subcutaneous fat may be visible but bone, tendon or muscle are NOT exposed.  Wound Description (Comments): this is a full thickness wound, NOT a pressure injury  Present on Admission: Yes     Pressure Injury 12/31/17 Stage II -  Partial thickness loss of dermis presenting as a shallow open ulcer with a red, pink wound bed without slough. these are partial thickness wounds, NOT pressure injuries (Active)  12/31/17 1453  Location: Back  Location Orientation: Right  Staging: Stage II -  Partial thickness loss of dermis presenting as a shallow open ulcer with a red, pink wound bed without slough.  Wound Description (Comments): these are partial thickness wounds, NOT pressure injuries  Present on Admission: Yes    Estimated body mass index is 26.92 kg/m as calculated from the following:   Height as of this encounter: 5\' 10"  (1.778 m).   Weight as of this encounter: 85.1 kg.   DVT prophylaxis: lovenox Family Communication: I have spoken to his wife over the phone Disposition Plan/Barrier to D/C: Unable to determine Code Status:     Code Status Orders  (From admission, onward)         Start     Ordered   05/05/2018 1722  Full code  Continuous     05/10/2018 1725        Code Status History    Date Active Date Inactive Code Status Order ID Comments User Context   12/31/2017 1647 01/10/2018 1927 Full Code [id number]  Merton Border, MD Inpatient   11/28/2017 1734 12/01/2017 2028 Full Code [id number]  Karmen Bongo, MD ED   07/07/2017 1512 07/11/2017 1650 Full Code [id number]  Radene Gunning, NP ED   04/23/2017 0130 04/25/2017 1858 Full Code [id number]  Jani Gravel, MD ED   01/21/2017 1535 01/25/2017 1702  Full Code [id number]  Debbe Odea, MD ED   11/24/2015 1106 11/24/2015 1901 Full Code [id number]  Bensimhon, Shaune Pascal, MD Inpatient   04/16/2015 1122 04/18/2015 1439 Full Code [id number]  Radene Gunning, NP ED   08/21/2014 1219 08/22/2014 1547 Full Code [id number]  Wellington Hampshire, MD Inpatient   08/20/2014 1339 08/21/2014 1219 Full Code [id number]  Consuelo Pandy, PA-C Inpatient   08/20/2014 0924 08/20/2014 1339 Full Code [id number]  Jolaine Artist, MD Inpatient   05/11/2014 0344 05/13/2014 2141 Full Code [id number]  Deneise Lever, MD ED   04/11/2013 1509 04/12/2013 1643 Full Code [id number]  Charlynne Cousins, MD Inpatient        IV Access:    Peripheral IV   Procedures and diagnostic studies:   Mr Brain Wo Contrast  Result Date: 05/23/2018 CLINICAL DATA:  RIGHT-sided weakness now resolved. Altered mental status, possible encephalopathy. COVID-19 positive. EXAM: MRI HEAD WITHOUT CONTRAST TECHNIQUE: Multiplanar, multiecho pulse sequences of the brain and surrounding structures were obtained without intravenous contrast. COMPARISON:  CTA head neck 05/06/2018. FINDINGS: Brain: No evidence for acute infarction, hemorrhage, mass lesion, hydrocephalus, or extra-axial fluid. Generalized atrophy. Extensive T2 and FLAIR hyperintensities throughout the white matter, likely  small vessel disease. Vascular: Normal flow voids. Skull and upper cervical spine: Normal marrow signal. Sinuses/Orbits: Chronic RIGHT maxillary sinus disease. BILATERAL ethmoid mucosal thickening. Negative orbits. Other: None. IMPRESSION: Atrophy and small vessel disease.  No acute intracranial findings. Electronically Signed   By: Staci Righter M.D.   On: 05/23/2018 16:20     Medical Consultants:    None.  Anti-Infectives:   IV vancomycin and cefepime  Subjective:    [name] has no new complaints, as per nurse is not having a good appetite.  Objective:    Vitals:   05/25/18 0425 05/25/18 0500  05/25/18 0600 05/25/18 0648  BP:  (!) 146/85 137/80   Pulse:  95 82   Resp:  (!) 26 (!) 22   Temp:    98.1 F (36.7 C)  TempSrc:    Oral  SpO2:  97% 96%   Weight: 85.1 kg     Height:        Intake/Output Summary (Last 24 hours) at 05/25/2018 0740 Last data filed at 05/25/2018 0500 Gross per 24 hour  Intake 260 ml  Output 3775 ml  Net -3515 ml   Filed Weights   05/23/18 2300 05/24/18 0500 05/25/18 0425  Weight: 89.5 kg 89.5 kg 85.1 kg    Exam: General exam: In no acute distress Respiratory system: Good air movement no rhonchi, crackles or wheezing Cardiovascular system: Rate and rhythm with positive S1-S2 no JVD. Gastrointestinal system: Bowel sounds soft nontender nondistended Central nervous system: Alert and oriented x3 Extremities: No lower extremity ulcers  Skin: Has multiple sacralanywhere from stage II-III   Data Reviewed:    Labs: Basic Metabolic Panel: Recent Labs  Lab 05/09/2018 1208 04/30/2018 1236 05/23/18 0457 05/24/18 0730 05/25/18 0520  NA 139  --  139 137 138  K 3.8  --  3.9 3.2* 3.3*  CL 102  --  110 109 109  CO2 26  --  23 19* 17*  GLUCOSE 86  --  66* 79 98  BUN 18  --  18 19 22   CREATININE 1.34* 1.20 1.23 1.02 1.26*  CALCIUM 8.7*  --  7.9* 7.8* 7.9*  MG  --   --  1.9 1.7 1.7  PHOS  --   --  3.7 2.6  --    GFR Estimated Creatinine Clearance: 50.7 mL/min (A) (by C-G formula based on SCr of 1.26 mg/dL (H)). Liver Function Tests: Recent Labs  Lab 05/11/2018 1208 05/23/18 0457 05/24/18 0730 05/25/18 0520  AST 20 20 21 28   ALT 21 22 23 26   ALKPHOS 76 64 61 65  BILITOT 0.8 0.7 0.8 0.8  PROT 5.8* 5.3* 5.8* 6.1*  ALBUMIN 2.8* 2.5* 2.7* 2.7*   No results for input(s): LIPASE, AMYLASE in the last 168 hours. No results for input(s): AMMONIA in the last 168 hours. Coagulation profile Recent Labs  Lab 04/28/2018 1208 05/23/18 0457 05/24/18 0730 05/25/18 0520  INR 2.6* 2.5* 2.2* 2.2*    CBC: Recent Labs  Lab 05/04/2018 1208 05/23/18 0457  05/24/18 0730 05/25/18 0520  WBC 11.7* 11.3* 11.0* 11.2*  NEUTROABS 8.4* 7.1 7.8* 8.0*  HGB 14.7 13.6 13.5 13.9  HCT 46.3 42.1 40.5 43.3  MCV 91.1 89.6 88.4 88.5  PLT 229 203 228 241   Cardiac Enzymes: Recent Labs  Lab 05/23/18 0457  CKTOTAL 62   BNP (last 3 results) No results for input(s): PROBNP in the last 8760 hours. CBG: Recent Labs  Lab 05/23/18 1202 05/23/18 1706 05/23/18 2107  05/24/18 0035 05/24/18 0342  GLUCAP 79 87 143* 83 95   D-Dimer: Recent Labs    05/24/18 0730 05/25/18 0520  DDIMER 0.78* 0.80*   Hgb A1c: Recent Labs    05/23/18 0457  HGBA1C 5.8*   Lipid Profile: Recent Labs    05/06/2018 1210 05/23/18 0457  CHOL  --  125  HDL  --  35*  LDLCALC  --  76  TRIG 105 71  70  CHOLHDL  --  3.6   Thyroid function studies: No results for input(s): TSH, T4TOTAL, T3FREE, THYROIDAB in the last 72 hours.  Invalid input(s): FREET3 Anemia work up: Recent Labs    05/23/18 0457 05/24/18 0759  FERRITIN 85 127   Sepsis Labs: Recent Labs  Lab 05/12/2018 1208 04/29/2018 1920 05/23/18 0457 05/24/18 0730 05/25/18 0520  PROCALCITON 0.15  --   --  0.36  --   WBC 11.7*  --  11.3* 11.0* 11.2*  LATICACIDVEN 1.9 1.6  --   --   --    Microbiology Recent Results (from the past 240 hour(s))  SARS Coronavirus 2 [facility] order, Performed in [place] hospital lab)     Status: Abnormal   Collection Time: 05/25/2018 12:10 PM  Result Value Ref Range Status   SARS Coronavirus 2 POSITIVE (A) NEGATIVE Final    Comment: RESULT CALLED TO, READ BACK BY AND VERIFIED WITH: RN Lowell Bouton [phone] (NOTE) If result is NEGATIVE SARS-CoV-2 target nucleic acids are NOT DETECTED. The SARS-CoV-2 RNA is generally detectable in upper and lower  respiratory specimens during the acute phase of infection. The lowest  concentration of SARS-CoV-2 viral copies this assay can detect is 250  copies / mL. A negative result does not preclude SARS-CoV-2 infection  and should not  be used as the sole basis for treatment or other  patient management decisions.  A negative result may occur with  improper specimen collection / handling, submission of specimen other  than nasopharyngeal swab, presence of viral mutation(s) within the  areas targeted by this assay, and inadequate number of viral copies  (<250 copies / mL). A negative result must be combined with clinical  observations, patient history, and epidemiological information. If result is POSITIVE SARS-CoV-2 target nucleic acids are DETECTED. The SARS-Co V-2 RNA is generally detectable in upper and lower  respiratory specimens during the acute phase of infection.  Positive  results are indicative of active infection with SARS-CoV-2.  Clinical  correlation with patient history and other diagnostic information is  necessary to determine patient infection status.  Positive results do  not rule out bacterial infection or co-infection with other viruses. If result is PRESUMPTIVE POSTIVE SARS-CoV-2 nucleic acids MAY BE PRESENT.   A presumptive positive result was obtained on the submitted specimen  and confirmed on repeat testing.  While 2019 novel coronavirus  (SARS-CoV-2) nucleic acids may be present in the submitted sample  additional confirmatory testing may be necessary for epidemiological  and / or clinical management purposes  to differentiate between  SARS-CoV-2 and other Sarbecovirus currently known to infect humans.  If clinically indicated additional testing with an alternate test  methodology [phone]) is advised.  The SARS-CoV-2 RNA is generally  detectable in upper and lower respiratory specimens during the acute  phase of infection. The expected result is Negative. Fact Sheet for Patients:  StrictlyIdeas.no Fact Sheet for Healthcare Providers: BankingDealers.co.za This test is not yet approved or cleared by the [place] FDA and has been authorized  for detection and/or diagnosis of SARS-CoV-2 by FDA under an Emergency Use Authorization (EUA).  This EUA will remain in effect (meaning this test can be used) for the duration of the COVID-19 declaration under Section 564(b)(1) of the Act, 21 U.S.C. section 360bbb-3(b)(1), unless the authorization is terminated or revoked sooner. Performed at [facility], [place] [address], [place], [place] [address]   Blood Culture (routine x 2)     Status: None (Preliminary result)   Collection Time: 05/04/2018 12:16 PM  Result Value Ref Range Status   Specimen Description BLOOD RIGHT ANTECUBITAL  Final   Special Requests   Final    BOTTLES DRAWN AEROBIC AND ANAEROBIC Blood Culture adequate volume   Culture   Final    NO GROWTH 3 DAYS Performed at [facility], [address], [place], [place] [address]    Report Status PENDING  Incomplete  Blood Culture (routine x 2)     Status: None (Preliminary result)   Collection Time: 04/26/2018  6:42 PM  Result Value Ref Range Status   Specimen Description BLOOD LEFT ANTECUBITAL  Final   Special Requests   Final    BOTTLES DRAWN AEROBIC ONLY Blood Culture results may not be optimal due to an inadequate volume of blood received in culture bottles   Culture   Final    NO GROWTH 3 DAYS Performed at [facility], [place] [address] [place], [place] [address]    Report Status PENDING  Incomplete  MRSA PCR Screening     Status: None   Collection Time: 05/23/18 10:45 AM  Result Value Ref Range Status   MRSA by PCR NEGATIVE NEGATIVE Final    Comment:        The GeneXpert MRSA Assay (FDA approved for NASAL specimens only), is one component of a comprehensive MRSA colonization surveillance program. It is not intended to diagnose MRSA infection nor to guide or monitor treatment for MRSA infections. Performed at [facility], [place] [address] [place], [place] [address]   C Difficile Quick Screen w PCR reflex     Status: None   Collection  Time: 05/24/18  4:52 PM  Result Value Ref Range Status   C Diff antigen NEGATIVE NEGATIVE Final   C Diff toxin NEGATIVE NEGATIVE Final   C Diff interpretation No C. difficile detected.  Final    Comment: Performed at [facility], [place] [address], [place], [place] [address]     Medications:   . albuterol  2 puff Inhalation Q6H  . azithromycin  500 mg Oral Daily  . carvedilol  6.25 mg Oral BID WC  . cholecalciferol  1,000 Units Oral Daily  . divalproex  125 mg Oral TID  . hydrocortisone  10 mg Oral Q breakfast   And  . hydrocortisone  20 mg Oral QAC lunch   And  . hydrocortisone  20 mg Oral QHS  . vitamin C  500 mg Oral Daily  . Warfarin - Pharmacist Dosing Inpatient   Does not apply q1800  . zinc sulfate  220 mg Oral Daily   Continuous Infusions: . sodium chloride    . ceFEPime (MAXIPIME) IV Stopped (05/25/18 0448)      LOS: 3 days   Charlynne Cousins  [facility]  05/25/2018, 7:40 AM

## 2018-05-25 NOTE — Progress Notes (Signed)
Patient unable to tolerate being in the prone position for longer than 45 minutes. Patient is more comfortable laying in a side position. O2 sat % ranging from 93-98% on 3LPM via [place].  Will continue to monitor.

## 2018-05-25 NOTE — Progress Notes (Signed)
Occupational Therapy Treatment Patient Details Name: [name] MRN: [id number] DOB: 1940-07-14 Today's Date: 05/25/2018    History of present illness 78 yo male presenting from SNF with AMS. Tested COVID positive. PMH including CHF, h/o GI bleeding, Addison's disease, dementia, anemia chronic disease, and pulmonary embolus.    OT comments  Pt tolerated therapy session with focus on activity seated EOB. Pt able to sit EOB approx 10 min during session, fluctuating levels of assist for sitting balance (min-maxA) required. Additional focus on wt bearing through bil elbows, trunk rotation and facilitating forward flexion as pt often with posterior lean. Pt able to complete simple self-feeding task with min hand over hand assist to support cup and bring to mouth. Pt tends to maintain digits in flexion but able to passively stretch into further extension. VSS throughout session with Pt on 2L O2. Feel POC remains appropriate at this time. Will continue to follow acutely.    Follow Up Recommendations  SNF;Supervision/Assistance - 24 hour    Equipment Recommendations  None recommended by OT    Recommendations for Other Services      Precautions / Restrictions Precautions Precautions: Fall Precaution Comments: flexiseal Restrictions Weight Bearing Restrictions: No       Mobility Bed Mobility Overal bed mobility: Needs Assistance Bed Mobility: Rolling;Sidelying to Sit;Sit to Sidelying Rolling: Max assist;+2 for physical assistance Sidelying to sit: +2 for physical assistance;Max assist     Sit to sidelying: Max assist;+2 for physical assistance;+2 for safety/equipment General bed mobility comments: Pt requiring Max A to roll, total assist to  to sit up and back to into bed  Transfers                      Balance Overall balance assessment: Needs assistance Sitting-balance support: Single extremity supported;Feet supported Sitting balance-Leahy Scale: Zero    Postural control: Posterior lean;Right lateral lean;Left lateral lean                                 ADL either performed or assessed with clinical judgement   ADL Overall ADL's : Needs assistance/impaired Eating/Feeding: Minimal assistance;Sitting Eating/Feeding Details (indicate cue type and reason): minA to hold cup and bring to mouth                                 Functional mobility during ADLs: Maximal assistance;+2 for physical assistance;+2 for safety/equipment(bed mobility only) General ADL Comments: pt tolerated sitting EOB approx 45min today with fluctuating levels of assist (min-maxA)     Vision       Perception     Praxis      Cognition Arousal/Alertness: Awake/alert Behavior During Therapy: Flat affect Overall Cognitive Status: History of cognitive impairments - at baseline Area of Impairment: Orientation;Memory;Following commands;Problem solving                 Orientation Level: Disoriented to;Place;Time;Situation     Following Commands: Follows one step commands inconsistently;Follows one step commands with increased time     Problem Solving: Slow processing;Decreased initiation;Requires verbal cues;Requires tactile cues General Comments: Baseline dementia. Pt following simple commands and agreeable to therapy. When assisted to sit midline, pt stated " Let Me do it" and then tris to lean to left asif to lie down        Exercises Exercises: Other exercises Other Exercises Other Exercises:  worked on trunk forward flexion as pt with posterior lean, pt able to assist with pulling himself forward; worked on truncal rotation to L/R and reaching towards L/R; wt bearing both on L and R elbow   Shoulder Instructions       General Comments VSS pt on 2L O2    Pertinent Vitals/ Pain       Pain Assessment: Faces Faces Pain Scale: Hurts a little bit Pain Location: when mobilizing Pain Descriptors / Indicators:  Grimacing;Moaning Pain Intervention(s): Monitored during session;Repositioned  Home Living                                          Prior Functioning/Environment              Frequency  Min 2X/week        Progress Toward Goals  OT Goals(current goals can now be found in the care plan section)  Progress towards OT goals: Progressing toward goals  Acute Rehab OT Goals Patient Stated Goal: Unstated by pt. wife wanting him safe and return to rehab OT Goal Formulation: With patient/family Time For Goal Achievement: 06/06/18 Potential to Achieve Goals: Good ADL Goals Pt Will Perform Grooming: with set-up;sitting;with supervision Pt Will Perform Upper Body Bathing: with min assist;sitting Pt Will Transfer to Toilet: with mod assist;stand pivot transfer;bedside commode Additional ADL Goal #1: Pt will perform bed mobility with Min A in preparation for ADLs Additional ADL Goal #2: Pt will tolerate sitting at EOB for 5 minutes with Min A  Plan Discharge plan remains appropriate    Co-evaluation    PT/OT/SLP Co-Evaluation/Treatment: Yes Reason for Co-Treatment: Complexity of the patient's impairments (multi-system involvement);For patient/therapist safety PT goals addressed during session: Mobility/safety with mobility OT goals addressed during session: ADL's and self-care;Strengthening/ROM      AM-PAC OT "6 Clicks" Daily Activity     Outcome Measure   Help from another person eating meals?: A Lot Help from another person taking care of personal grooming?: A Lot Help from another person toileting, which includes using toliet, bedpan, or urinal?: Total Help from another person bathing (including washing, rinsing, drying)?: A Lot Help from another person to put on and taking off regular upper body clothing?: A Lot Help from another person to put on and taking off regular lower body clothing?: Total 6 Click Score: 10    End of Session Equipment Utilized  During Treatment: Oxygen  OT Visit Diagnosis: Unsteadiness on feet (R26.81);Other abnormalities of gait and mobility (R26.89);Muscle weakness (generalized) (M62.81);Other symptoms and signs involving cognitive function   Activity Tolerance Patient tolerated treatment well   Patient Left in bed;with call bell/phone within reach;with bed alarm set   Nurse Communication Mobility status        Time: 0881-1031 OT Time Calculation (min): 24 min  Charges: OT General Charges $OT Visit: 1 Visit OT Treatments $Self Care/Home Management : 8-22 mins  Lou Cal, [place] Pager [phone] Office [phone]    Raymondo Band 05/25/2018, 4:35 PM

## 2018-05-25 NOTE — Consult Note (Signed)
[facility] Nurse wound consult note Patient receiving care in [facility] [phone].  COVID +.  Consult completed via telephone conversation with primary RN, Vinnie Level. Reason for Consult: wound care needs Wound type: Healing right hip PI, unknown original PI injury depth. Pressure Injury POA: Yes Measurement: There is a "slit" surrounded by scar tissue.  The scar tissue area measures 4 cm x 3 cm x 1.1 cm.   Wound bed: Cannot be seen. Drainage (amount, consistency, odor) some serous Periwound: intact scar tissue Dressing procedure/placement/frequency: Apply a small foam dressing over the hip wound. Change every 3 days and prn. Monitor the wound area(s) for worsening of condition such as: Signs/symptoms of infection,  Increase in size,  Development of or worsening of odor, Development of pain, or increased pain at the affected locations.  Notify the medical team if any of these develop.  Thank you for the consult.  Discussed plan of care with the bedside nurse.  [facility] nurse will not follow at this time.  Please re-consult the [facility] team if needed.  Val Riles, RN, MSN, CWOCN, CNS-BC, pager [phone]

## 2018-05-26 LAB — COMPREHENSIVE METABOLIC PANEL
ALT: 25 U/L (ref 0–44)
AST: 28 U/L (ref 15–41)
Albumin: 2.7 g/dL — ABNORMAL LOW (ref 3.5–5.0)
Alkaline Phosphatase: 62 U/L (ref 38–126)
Anion gap: 10 (ref 5–15)
BUN: 18 mg/dL (ref 8–23)
CO2: 20 mmol/L — ABNORMAL LOW (ref 22–32)
Calcium: 7.8 mg/dL — ABNORMAL LOW (ref 8.9–10.3)
Chloride: 110 mmol/L (ref 98–111)
Creatinine, Ser: 1.05 mg/dL (ref 0.61–1.24)
GFR calc Af Amer: >60 mL/min (ref >60)
GFR calc non Af Amer: >60 mL/min (ref >60)
Glucose, Bld: 91 mg/dL (ref 70–99)
Potassium: 3 mmol/L — ABNORMAL LOW (ref 3.5–5.1)
Sodium: 140 mmol/L (ref 135–145)
Total Bilirubin: 0.8 mg/dL (ref 0.3–1.2)
Total Protein: 6.3 g/dL — ABNORMAL LOW (ref 6.5–8.1)

## 2018-05-26 LAB — C-REACTIVE PROTEIN: CRP: 14.7 mg/dL — ABNORMAL HIGH (ref <1.0)

## 2018-05-26 LAB — CBC WITH DIFFERENTIAL/PLATELET
Abs Immature Granulocytes: 0.03 10*3/uL (ref 0.00–0.07)
Basophils Absolute: 0 10*3/uL (ref 0.0–0.1)
Basophils Relative: 0 %
Eosinophils Absolute: 0.2 10*3/uL (ref 0.0–0.5)
Eosinophils Relative: 2 %
HCT: 43.1 % (ref 39.0–52.0)
Hemoglobin: 14.3 g/dL (ref 13.0–17.0)
Immature Granulocytes: 0 %
Lymphocytes Relative: 18 %
Lymphs Abs: 1.7 10*3/uL (ref 0.7–4.0)
MCH: 29.4 pg (ref 26.0–34.0)
MCHC: 33.2 g/dL (ref 30.0–36.0)
MCV: 88.5 fL (ref 80.0–100.0)
Monocytes Absolute: 0.8 10*3/uL (ref 0.1–1.0)
Monocytes Relative: 9 %
Neutro Abs: 6.6 10*3/uL (ref 1.7–7.7)
Neutrophils Relative %: 71 %
Platelets: 222 10*3/uL (ref 150–400)
RBC: 4.87 MIL/uL (ref 4.22–5.81)
RDW: 16.7 % — ABNORMAL HIGH (ref 11.5–15.5)
WBC: 9.3 10*3/uL (ref 4.0–10.5)
nRBC: 0 % (ref 0.0–0.2)

## 2018-05-26 LAB — D-DIMER, QUANTITATIVE: D-Dimer, Quant: 0.75 ug/mL-FEU — ABNORMAL HIGH (ref 0.00–0.50)

## 2018-05-26 LAB — FERRITIN: Ferritin: 143 ng/mL (ref 24–336)

## 2018-05-26 LAB — PROTIME-INR
INR: 3.4 — ABNORMAL HIGH (ref 0.8–1.2)
Prothrombin Time: 33.9 seconds — ABNORMAL HIGH (ref 11.4–15.2)

## 2018-05-26 LAB — PROCALCITONIN: Procalcitonin: 0.45 ng/mL

## 2018-05-26 LAB — MAGNESIUM: Magnesium: 1.9 mg/dL (ref 1.7–2.4)

## 2018-05-26 MED ORDER — AMLODIPINE BESYLATE 5 MG PO TABS
5.0000 mg | ORAL_TABLET | Freq: Every day | ORAL | Status: DC
  Administered 2018-05-26 – 2018-06-05 (×10): 5 mg via ORAL
  Filled 2018-05-26 (×10): qty 1

## 2018-05-26 MED ORDER — FUROSEMIDE 20 MG PO TABS
20.0000 mg | ORAL_TABLET | Freq: Every day | ORAL | Status: DC
  Administered 2018-05-26 – 2018-05-27 (×2): 20 mg via ORAL
  Filled 2018-05-26 (×3): qty 1

## 2018-05-26 MED ORDER — POTASSIUM CHLORIDE CRYS ER 20 MEQ PO TBCR
40.0000 meq | EXTENDED_RELEASE_TABLET | Freq: Three times a day (TID) | ORAL | Status: AC
  Administered 2018-05-26 – 2018-05-27 (×3): 40 meq via ORAL
  Filled 2018-05-26 (×3): qty 2

## 2018-05-26 MED ORDER — HYDRALAZINE HCL 20 MG/ML IJ SOLN
5.0000 mg | INTRAMUSCULAR | Status: DC | PRN
  Administered 2018-05-26: 17:00:00 5 mg via INTRAVENOUS
  Filled 2018-05-26: qty 1

## 2018-05-26 NOTE — Progress Notes (Signed)
[facility] PROGRESS NOTE    Progress Note  [name]  YIR:[id number] DOB: [birth year] DOA: 04/28/2018 PCP: Lawerance Cruel, MD     Brief Narrative:   [name] is an 78 y.o. male past medical history of diastolic heart failure, history of GI bleed, dementia anemia of chronic disease PE on Coumadin found to be lethargic facility on the day of admission.  Code stroke was called admission, TPA was not given he was found to be febrile hypotensive tachycardic and hypoxic in the ED CT of the head negative for stroke, CT Angie of the head and neck showed no significant stenosis. Patient was found to have COVID-19 positive PCR. Comes from home.  Assessment/Plan:   Sirs secondary to COVID-19: As per wife started on 4.26.2020. Multiple risk factors for complications : age, male, essential hypertension diabetes mellitus type 2. Continue to [facility] IV fluids. He has remained afebrile, his leukocytosis is improved. Continue IV vancomycin and cefepime which was started on 05/25/2018-05/31/2018. COVID-19 markers: D-dimer: Is slowly improving, today 0.7 Ferritin: 161 > 143 CRP: Is slightly rising 5.2>12.2>14.2>15.9>14.7. All his markers are slowly improving.  Acute respiratory failure with hypoxia due to COVID-19 virus infection He continues to require 3 L of oxygen to keep the saturation above 88%. Physical therapy evaluated the patient the recommended skilled nursing facility. Patient has not tolerated being prone. COVID-19 markers: Are slowly improving.  Acute metabolic encephalopathy with right-sided weakness: Continue Coumadin per pharmacy. Hard to evaluate his as he does have a history of dementia.  He is less talkative today.  Adrenal insufficiency/Addison's disease: Continue his current home dose.  Chronic diastolic heart failure: Today he is negative about 3 L. Continue Coreg, transition his Lasix to oral creatinine has returned to baseline.  Diabetes  mellitus type 2: A1c was 5.8. Excellent controlled continue sliding scale insulin.  History of PE on Coumadin: Continue current medication. Is currently therapeutic.  Hypothyroidism: Continue Synthroid.  History of dementia: No events overnight continue to oral Depakote.  New onset Diarrhea: Diarrhea has resolved will discontinue his Flexi-Seal.  New non-anion gap metabolic acidosis: Acidosis has resolved continue bicarbonate tablets for 1 additional day. Check a basic metabolic panel in the morning.  Ethics: Spoke to the family today they would like him to be a DNR.  Chronic kidney disease stage III: His creatinine has returned to baseline. Changes Lasix to orals.  Present on admission various pressure ulcer anywhere from stage II to stage III: Turn patient every 2 hours.  RN Pressure Injury Documentation: Pressure Injury 12/31/17 Unstageable - Full thickness tissue loss in which the base of the ulcer is covered by slough (yellow, tan, gray, green or brown) and/or eschar (tan, brown or black) in the wound bed. 2 areas of unstageable pressure injuries (Active)  12/31/17 1451  Location: Hip  Location Orientation: Right  Staging: Unstageable - Full thickness tissue loss in which the base of the ulcer is covered by slough (yellow, tan, gray, green or brown) and/or eschar (tan, brown or black) in the wound bed.  Wound Description (Comments): 2 areas of unstageable pressure injuries  Present on Admission: Yes     Pressure Injury 12/31/17 Stage III -  Full thickness tissue loss. Subcutaneous fat may be visible but bone, tendon or muscle are NOT exposed. this is a full thickness wound, NOT a pressure injury (Active)  12/31/17 1452  Location: Foot  Location Orientation: Left  Staging: Stage III -  Full thickness tissue loss. Subcutaneous fat may  be visible but bone, tendon or muscle are NOT exposed.  Wound Description (Comments): this is a full thickness wound, NOT a pressure  injury  Present on Admission: Yes     Pressure Injury 12/31/17 Stage II -  Partial thickness loss of dermis presenting as a shallow open ulcer with a red, pink wound bed without slough. these are partial thickness wounds, NOT pressure injuries (Active)  12/31/17 1453  Location: Back  Location Orientation: Right  Staging: Stage II -  Partial thickness loss of dermis presenting as a shallow open ulcer with a red, pink wound bed without slough.  Wound Description (Comments): these are partial thickness wounds, NOT pressure injuries  Present on Admission: Yes    Estimated body mass index is 26.63 kg/m as calculated from the following:   Height as of this encounter: 5\' 10"  (1.778 m).   Weight as of this encounter: 84.2 kg.   DVT prophylaxis: lovenox Family Communication: I have spoken to his wife over the phone Disposition Plan/Barrier to D/C: Unable to determine Code Status:     Code Status Orders  (From admission, onward)         Start     Ordered   05/23/2018 1722  Full code  Continuous     05/18/2018 1725        Code Status History    Date Active Date Inactive Code Status Order ID Comments User Context   12/31/2017 1647 01/10/2018 1927 Full Code [id number]  Merton Border, MD Inpatient   11/28/2017 1734 12/01/2017 2028 Full Code [id number]  Karmen Bongo, MD ED   07/07/2017 1512 07/11/2017 1650 Full Code [id number]  Radene Gunning, NP ED   04/23/2017 0130 04/25/2017 1858 Full Code [id number]  Jani Gravel, MD ED   01/21/2017 1535 01/25/2017 1702 Full Code [id number]  Debbe Odea, MD ED   11/24/2015 1106 11/24/2015 1901 Full Code [id number]  Bensimhon, Shaune Pascal, MD Inpatient   04/16/2015 1122 04/18/2015 1439 Full Code [id number]  Radene Gunning, NP ED   08/21/2014 1219 08/22/2014 1547 Full Code [id number]  Wellington Hampshire, MD Inpatient   08/20/2014 1339 08/21/2014 1219 Full Code [id number]  Consuelo Pandy, PA-C Inpatient   08/20/2014 0924 08/20/2014 1339 Full Code [id number]  Jolaine Artist,  MD Inpatient   05/11/2014 0344 05/13/2014 2141 Full Code [id number]  Deneise Lever, MD ED   04/11/2013 1509 04/12/2013 1643 Full Code [id number]  Charlynne Cousins, MD Inpatient        IV Access:    Peripheral IV   Procedures and diagnostic studies:   Dg Chest Port 1 View  Result Date: 05/25/2018 CLINICAL DATA:  Dyspnea. EXAM: PORTABLE CHEST 1 VIEW COMPARISON:  Radiograph of May 22, 2018. FINDINGS: Stable cardiomediastinal silhouette. No pneumothorax or pleural effusion is noted. Both lungs are clear. The visualized skeletal structures are unremarkable. IMPRESSION: No acute cardiopulmonary abnormality seen. Aortic Atherosclerosis (ICD10-I70.0). Electronically Signed   By: Marijo Conception M.D.   On: 05/25/2018 07:45     Medical Consultants:    None.  Anti-Infectives:   IV vancomycin and cefepime  Subjective:    Jerin R Anglemyer no complaints today.  Objective:    Vitals:   05/25/18 1627 05/26/18 0400 05/26/18 0511 05/26/18 0600  BP: 139/78  (!) 178/92   Pulse: 81     Resp: (!) 24     Temp: 98.7 F (37.1 C) 98.8 F (37.1 C)    TempSrc: Oral Oral    SpO2: 96%  Weight:    84.2 kg  Height:        Intake/Output Summary (Last 24 hours) at 05/26/2018 0732 Last data filed at 05/26/2018 0544 Gross per 24 hour  Intake 390 ml  Output 1150 ml  Net -760 ml   Filed Weights   05/24/18 0500 05/25/18 0425 05/26/18 0600  Weight: 89.5 kg 85.1 kg 84.2 kg    Exam: General exam: In no acute distress Respiratory system: Good air movement and clear to auscultation. Cardiovascular system: Rate and rhythm with positive S1-S2 no JVD. Gastrointestinal system: Positive bowel sounds soft nontender Central nervous system: Awake alert and oriented x3 nonfocal. Extremities: No lower extremity edema Skin: No rashes, lesions or ulcers    Data Reviewed:    Labs: Basic Metabolic Panel: Recent Labs  Lab 05/25/2018 1208 05/10/2018 1236 05/23/18 0457 05/24/18 0730 05/25/18  0520 05/26/18 0600  NA 139  --  139 137 138 140  K 3.8  --  3.9 3.2* 3.3* 3.0*  CL 102  --  110 109 109 110  CO2 26  --  23 19* 17* 20*  GLUCOSE 86  --  66* 79 98 91  BUN 18  --  18 19 22 18   CREATININE 1.34* 1.20 1.23 1.02 1.26* 1.05  CALCIUM 8.7*  --  7.9* 7.8* 7.9* 7.8*  MG  --   --  1.9 1.7 1.7 1.9  PHOS  --   --  3.7 2.6  --   --    GFR Estimated Creatinine Clearance: 60.8 mL/min (by C-G formula based on SCr of 1.05 mg/dL). Liver Function Tests: Recent Labs  Lab 05/08/2018 1208 05/23/18 0457 05/24/18 0730 05/25/18 0520 05/26/18 0600  AST 20 20 21 28 28   ALT 21 22 23 26 25   ALKPHOS 76 64 61 65 62  BILITOT 0.8 0.7 0.8 0.8 0.8  PROT 5.8* 5.3* 5.8* 6.1* 6.3*  ALBUMIN 2.8* 2.5* 2.7* 2.7* 2.7*   No results for input(s): LIPASE, AMYLASE in the last 168 hours. No results for input(s): AMMONIA in the last 168 hours. Coagulation profile Recent Labs  Lab 04/27/2018 1208 05/23/18 0457 05/24/18 0730 05/25/18 0520 05/26/18 0600  INR 2.6* 2.5* 2.2* 2.2* 3.4*    CBC: Recent Labs  Lab 05/16/2018 1208 05/23/18 0457 05/24/18 0730 05/25/18 0520 05/26/18 0600  WBC 11.7* 11.3* 11.0* 11.2* 9.3  NEUTROABS 8.4* 7.1 7.8* 8.0* 6.6  HGB 14.7 13.6 13.5 13.9 14.3  HCT 46.3 42.1 40.5 43.3 43.1  MCV 91.1 89.6 88.4 88.5 88.5  PLT 229 203 228 241 222   Cardiac Enzymes: Recent Labs  Lab 05/23/18 0457  CKTOTAL 62   BNP (last 3 results) No results for input(s): PROBNP in the last 8760 hours. CBG: Recent Labs  Lab 05/23/18 1202 05/23/18 1706 05/23/18 2107 05/24/18 0035 05/24/18 0342  GLUCAP 79 87 143* 83 95   D-Dimer: Recent Labs    05/25/18 0520 05/26/18 0600  DDIMER 0.80* 0.75*   Hgb A1c: No results for input(s): HGBA1C in the last 72 hours. Lipid Profile: No results for input(s): CHOL, HDL, LDLCALC, TRIG, CHOLHDL, LDLDIRECT in the last 72 hours. Thyroid function studies: No results for input(s): TSH, T4TOTAL, T3FREE, THYROIDAB in the last 72 hours.  Invalid  input(s): FREET3 Anemia work up: Recent Labs    05/25/18 0520 05/26/18 0600  FERRITIN 161 143   Sepsis Labs: Recent Labs  Lab 05/23/2018 1208 05/18/2018 1920 05/23/18 0457 05/24/18 0730 05/25/18 0520 05/26/18 0600  PROCALCITON 0.15  --   --  0.36 0.92 0.45  WBC 11.7*  --  11.3* 11.0* 11.2* 9.3  LATICACIDVEN 1.9 1.6  --   --   --   --    Microbiology Recent Results (from the past 240 hour(s))  SARS Coronavirus 2 [facility] order, Performed in [place] hospital lab)     Status: Abnormal   Collection Time: 05/07/2018 12:10 PM  Result Value Ref Range Status   SARS Coronavirus 2 POSITIVE (A) NEGATIVE Final    Comment: RESULT CALLED TO, READ BACK BY AND VERIFIED WITH: RN Lowell Bouton [phone] (NOTE) If result is NEGATIVE SARS-CoV-2 target nucleic acids are NOT DETECTED. The SARS-CoV-2 RNA is generally detectable in upper and lower  respiratory specimens during the acute phase of infection. The lowest  concentration of SARS-CoV-2 viral copies this assay can detect is 250  copies / mL. A negative result does not preclude SARS-CoV-2 infection  and should not be used as the sole basis for treatment or other  patient management decisions.  A negative result may occur with  improper specimen collection / handling, submission of specimen other  than nasopharyngeal swab, presence of viral mutation(s) within the  areas targeted by this assay, and inadequate number of viral copies  (<250 copies / mL). A negative result must be combined with clinical  observations, patient history, and epidemiological information. If result is POSITIVE SARS-CoV-2 target nucleic acids are DETECTED. The SARS-Co V-2 RNA is generally detectable in upper and lower  respiratory specimens during the acute phase of infection.  Positive  results are indicative of active infection with SARS-CoV-2.  Clinical  correlation with patient history and other diagnostic information is  necessary to determine patient  infection status.  Positive results do  not rule out bacterial infection or co-infection with other viruses. If result is PRESUMPTIVE POSTIVE SARS-CoV-2 nucleic acids MAY BE PRESENT.   A presumptive positive result was obtained on the submitted specimen  and confirmed on repeat testing.  While 2019 novel coronavirus  (SARS-CoV-2) nucleic acids may be present in the submitted sample  additional confirmatory testing may be necessary for epidemiological  and / or clinical management purposes  to differentiate between  SARS-CoV-2 and other Sarbecovirus currently known to infect humans.  If clinically indicated additional testing with an alternate test  methodology [phone]) is advised.  The SARS-CoV-2 RNA is generally  detectable in upper and lower respiratory specimens during the acute  phase of infection. The expected result is Negative. Fact Sheet for Patients:  StrictlyIdeas.no Fact Sheet for Healthcare Providers: BankingDealers.co.za This test is not yet approved or cleared by the [place] FDA and has been authorized for detection and/or diagnosis of SARS-CoV-2 by FDA under an Emergency Use Authorization (EUA).  This EUA will remain in effect (meaning this test can be used) for the duration of the COVID-19 declaration under Section 564(b)(1) of the Act, 21 U.S.C. section 360bbb-3(b)(1), unless the authorization is terminated or revoked sooner. Performed at [facility], [place] [address], [place], [place] [address]   Blood Culture (routine x 2)     Status: None (Preliminary result)   Collection Time: 05/19/2018 12:16 PM  Result Value Ref Range Status   Specimen Description BLOOD RIGHT ANTECUBITAL  Final   Special Requests   Final    BOTTLES DRAWN AEROBIC AND ANAEROBIC Blood Culture adequate volume   Culture   Final    NO GROWTH 3 DAYS Performed at [facility], [place] [address], [place], [place] [address]  Report Status  PENDING  Incomplete  Blood Culture (routine x 2)     Status: None (Preliminary result)   Collection Time: 05/10/2018  6:42 PM  Result Value Ref Range Status   Specimen Description BLOOD LEFT ANTECUBITAL  Final   Special Requests   Final    BOTTLES DRAWN AEROBIC ONLY Blood Culture results may not be optimal due to an inadequate volume of blood received in culture bottles   Culture   Final    NO GROWTH 3 DAYS Performed at [facility], [place] [address], [place], [place] [address]    Report Status PENDING  Incomplete  MRSA PCR Screening     Status: None   Collection Time: 05/23/18 10:45 AM  Result Value Ref Range Status   MRSA by PCR NEGATIVE NEGATIVE Final    Comment:        The GeneXpert MRSA Assay (FDA approved for NASAL specimens only), is one component of a comprehensive MRSA colonization surveillance program. It is not intended to diagnose MRSA infection nor to guide or monitor treatment for MRSA infections. Performed at [facility], [place] [address] [place], [place] [address]   C Difficile Quick Screen w PCR reflex     Status: None   Collection Time: 05/24/18  4:52 PM  Result Value Ref Range Status   C Diff antigen NEGATIVE NEGATIVE Final   C Diff toxin NEGATIVE NEGATIVE Final   C Diff interpretation No C. difficile detected.  Final    Comment: Performed at [facility], [place] [address] [place], [place] [address]     Medications:   . amLODipine  5 mg Oral Daily  . azithromycin  500 mg Oral Daily  . carvedilol  6.25 mg Oral BID WC  . cholecalciferol  1,000 Units Oral Daily  . divalproex  125 mg Oral TID  . hydrocortisone  10 mg Oral Q breakfast   And  . hydrocortisone  20 mg Oral QAC lunch   And  . hydrocortisone  20 mg Oral QHS  . psyllium  1 packet Oral Daily  . sodium bicarbonate  650 mg Oral BID  . vitamin C  500 mg Oral Daily  . warfarin  2.5 mg Oral Once per day on Tue Fri  . warfarin  5 mg Oral Once per day on Sun Mon Wed Thu  Sat  . Warfarin - Pharmacist Dosing Inpatient   Does not apply q1800  . zinc sulfate  220 mg Oral Daily   Continuous Infusions: . sodium chloride 10 mL/hr at 05/25/18 1800  . ceFEPime (MAXIPIME) IV 2 g (05/26/18 0411)  . vancomycin 1,750 mg (05/25/18 1142)      LOS: 4 days   [facility]  05/26/2018, 7:32 AM

## 2018-05-26 NOTE — Progress Notes (Signed)
[facility] for warfarin Indication: VTE history    Assessment: 4 yom on warfarin PTA for PE/DVT history presenting with AMS, sepsis due to COVID positive. CVA work-up initiated.   Pharmacy consulted to dose warfarin inpatient. INR therapeutic at 2.6 on admit. Noted remote hx of GIB, but no active bleed issues documented.  INR today has increased significantly from 2.2>3.4 (likely due to poor intake). H/H and plt stable   PTA warfarin dose: 5mg  daily except 2.5mg  on Tues/Fri (last dose 4/26 PTA)  Goal of Therapy:  INR 2-3 Monitor platelets by anticoagulation protocol: Yes   Plan:  Hold warfarin today   Daily INR Monitor CBC, s/sx bleeding   Albertina Parr, PharmD., BCPS Clinical Pharmacist Clinical phone for 05/26/18 until 4pm: [phone] If after 4pm, please refer to [facility] for unit-specific pharmacist

## 2018-05-26 NOTE — Progress Notes (Signed)
Called pts wife and gave her updates

## 2018-05-26 DEATH — deceased

## 2018-05-27 LAB — MAGNESIUM: Magnesium: 1.8 mg/dL (ref 1.7–2.4)

## 2018-05-27 LAB — COMPREHENSIVE METABOLIC PANEL
ALT: 23 U/L (ref 0–44)
AST: 28 U/L (ref 15–41)
Albumin: 2.6 g/dL — ABNORMAL LOW (ref 3.5–5.0)
Alkaline Phosphatase: 65 U/L (ref 38–126)
Anion gap: 12 (ref 5–15)
BUN: 17 mg/dL (ref 8–23)
CO2: 18 mmol/L — ABNORMAL LOW (ref 22–32)
Calcium: 7.9 mg/dL — ABNORMAL LOW (ref 8.9–10.3)
Chloride: 109 mmol/L (ref 98–111)
Creatinine, Ser: 1.14 mg/dL (ref 0.61–1.24)
GFR calc Af Amer: >60 mL/min (ref >60)
GFR calc non Af Amer: >60 mL/min (ref >60)
Glucose, Bld: 90 mg/dL (ref 70–99)
Potassium: 2.9 mmol/L — ABNORMAL LOW (ref 3.5–5.1)
Sodium: 139 mmol/L (ref 135–145)
Total Bilirubin: 1 mg/dL (ref 0.3–1.2)
Total Protein: 6.3 g/dL — ABNORMAL LOW (ref 6.5–8.1)

## 2018-05-27 LAB — CBC WITH DIFFERENTIAL/PLATELET
Abs Immature Granulocytes: 0.1 10*3/uL — ABNORMAL HIGH (ref 0.00–0.07)
Basophils Absolute: 0 10*3/uL (ref 0.0–0.1)
Basophils Relative: 0 %
Eosinophils Absolute: 0 10*3/uL (ref 0.0–0.5)
Eosinophils Relative: 0 %
HCT: 47.8 % (ref 39.0–52.0)
Hemoglobin: 15.1 g/dL (ref 13.0–17.0)
Immature Granulocytes: 1 %
Lymphocytes Relative: 16 %
Lymphs Abs: 1.7 10*3/uL (ref 0.7–4.0)
MCH: 28.5 pg (ref 26.0–34.0)
MCHC: 31.6 g/dL (ref 30.0–36.0)
MCV: 90.4 fL (ref 80.0–100.0)
Monocytes Absolute: 0.9 10*3/uL (ref 0.1–1.0)
Monocytes Relative: 9 %
Neutro Abs: 7.6 10*3/uL (ref 1.7–7.7)
Neutrophils Relative %: 74 %
Platelets: 239 10*3/uL (ref 150–400)
RBC: 5.29 MIL/uL (ref 4.22–5.81)
RDW: 16.9 % — ABNORMAL HIGH (ref 11.5–15.5)
WBC: 10.3 10*3/uL (ref 4.0–10.5)
nRBC: 0 % (ref 0.0–0.2)

## 2018-05-27 LAB — CULTURE, BLOOD (ROUTINE X 2)
Culture: NO GROWTH
Culture: NO GROWTH
Special Requests: ADEQUATE

## 2018-05-27 LAB — C-REACTIVE PROTEIN: CRP: 16.7 mg/dL — ABNORMAL HIGH (ref <1.0)

## 2018-05-27 LAB — D-DIMER, QUANTITATIVE: D-Dimer, Quant: 1.18 ug/mL-FEU — ABNORMAL HIGH (ref 0.00–0.50)

## 2018-05-27 LAB — PROTIME-INR
INR: 3.8 — ABNORMAL HIGH (ref 0.8–1.2)
Prothrombin Time: 37.1 seconds — ABNORMAL HIGH (ref 11.4–15.2)

## 2018-05-27 LAB — FERRITIN: Ferritin: 218 ng/mL (ref 24–336)

## 2018-05-27 MED ORDER — POTASSIUM CHLORIDE 20 MEQ/15ML (10%) PO SOLN
20.0000 meq | Freq: Three times a day (TID) | ORAL | Status: AC
  Administered 2018-05-27 (×3): 20 meq via ORAL
  Filled 2018-05-27 (×3): qty 15

## 2018-05-27 MED ORDER — POTASSIUM CHLORIDE 20 MEQ/15ML (10%) PO SOLN: 20.0000 meq | Freq: Two times a day (BID) | ORAL | Status: DC

## 2018-05-27 MED ORDER — POTASSIUM CHLORIDE 10 MEQ/100ML IV SOLN
10.0000 meq | INTRAVENOUS | Status: AC
  Administered 2018-05-27 (×5): 10 meq via INTRAVENOUS
  Filled 2018-05-27 (×3): qty 100

## 2018-05-27 MED ORDER — MAGNESIUM SULFATE 2 GM/50ML IV SOLN
2.0000 g | Freq: Once | INTRAVENOUS | Status: AC
  Administered 2018-05-27: 12:00:00 2 g via INTRAVENOUS
  Filled 2018-05-27: qty 50

## 2018-05-27 MED ORDER — SODIUM BICARBONATE 650 MG PO TABS
650.0000 mg | ORAL_TABLET | Freq: Four times a day (QID) | ORAL | Status: DC
  Administered 2018-05-27 – 2018-05-28 (×5): 650 mg via ORAL
  Filled 2018-05-27 (×8): qty 1

## 2018-05-27 NOTE — Progress Notes (Signed)
Spoke with Darlene and provided pt updates. Pt and spouse spoke as well.

## 2018-05-27 NOTE — Progress Notes (Signed)
[facility] PROGRESS NOTE    Progress Note  [name]  VPX:[id number] DOB: 1940/11/16 DOA: 05/05/2018 PCP: Lawerance Cruel, MD     Brief Narrative:   [name] is an 78 y.o. male past medical history of diastolic heart failure, history of GI bleed, dementia anemia of chronic disease PE on Coumadin found to be lethargic facility on the day of admission.  Code stroke was called admission, TPA was not given he was found to be febrile hypotensive tachycardic and hypoxic in the ED CT of the head negative for stroke, CT Angie of the head and neck showed no significant stenosis. Patient was found to have COVID-19 positive PCR. Comes from home.  Assessment/Plan:   Sirs secondary to COVID-19: As per wife started on 4.26.2020. Multiple risk factors for complications : age, male, essential hypertension diabetes mellitus type 2. Continue to [facility] IV fluids. IV vancomycin and cefepime  05/25/2018-05/31/2018.  His procalcitonin peaked at 1 now and slowly trending down.  He continues to spike fevers. Inflammatory markers: D-dimer: 0.8>0.7>1.1 Ferritin: 161 > 143 >218 CRP: Is slightly rising 5.2>12.2>14.2>15.9>14.7>16.7 He continues to spike fevers, continue to use Tylenol. His inflammatory markers continue to rise, patient did not express any difficulty in his breathing, although this is difficult to quantify due to his dementia.  Acute respiratory failure with hypoxia due to COVID-19 virus infection He continues to require 3 L of oxygen to keep the saturation above 88%. Physical therapy evaluated the patient the recommended skilled nursing facility. Patient has not tolerated being prone.  Last time the nurses try to prone the patient he became combative. Patient cannot express if his breathing has improved or deteriorated due to his dementia, we are keeping a close eye on his oxygen saturation and his respiratory rate I have asked the nurse personally to do this, he has  remained stable throughout the last 12 to 24 hours. Inflammatory markers: Slowly worsening today.  Acute metabolic encephalopathy with right-sided weakness: Continue Coumadin per pharmacy. No acute episodes or sundowning.  Adrenal insufficiency/Addison's disease: Continue his current home dose.  Chronic diastolic heart failure: Today he is negative about 3 L.  Continue Coreg, will hold his Lasix for today, as his potassium continues to trend down.  Hypokalemia/hypomagnesemia: According to the nurse the patient has been taking his oral potassium. Repeat a basic metabolic panel in the morning.  Diabetes mellitus type 2: A1c was 5.8. Excellent controlled continue sliding scale insulin.  History of PE on Coumadin: Continue current medication. Is currently therapeutic.  Hypothyroidism: Continue Synthroid.  History of dementia without behavioral disturbances: No events overnight continue to oral Depakote.  New onset Diarrhea: Diarrhea has resolved will discontinue his Flexi-Seal.  New non-anion gap metabolic acidosis: His diarrhea has resolved, his potassium is low his bicarbonate is also low.  I am concerned about renal tubular acidosis type II, although he is not hyperchloremic continue bicarbonate tablets. Per the nurse the patient has been taking his bicarbonate tablets.  Ethics: Spoke to the family today they would like him to be a DNR.  Chronic kidney disease stage III: His creatinine has returned to baseline. We will hold Lasix for today as his calcium is low even with repletion.  Present on admission various pressure ulcer anywhere from stage II to stage III: Turn patient every 2 hours.  RN Pressure Injury Documentation: Pressure Injury 12/31/17 Unstageable - Full thickness tissue loss in which the base of the ulcer is covered by slough (yellow, tan, gray, green  or brown) and/or eschar (tan, brown or black) in the wound bed. 2 areas of unstageable pressure injuries  (Active)  12/31/17 1451  Location: Hip  Location Orientation: Right  Staging: Unstageable - Full thickness tissue loss in which the base of the ulcer is covered by slough (yellow, tan, gray, green or brown) and/or eschar (tan, brown or black) in the wound bed.  Wound Description (Comments): 2 areas of unstageable pressure injuries  Present on Admission: Yes     Pressure Injury 12/31/17 Stage III -  Full thickness tissue loss. Subcutaneous fat may be visible but bone, tendon or muscle are NOT exposed. this is a full thickness wound, NOT a pressure injury (Active)  12/31/17 1452  Location: Foot  Location Orientation: Left  Staging: Stage III -  Full thickness tissue loss. Subcutaneous fat may be visible but bone, tendon or muscle are NOT exposed.  Wound Description (Comments): this is a full thickness wound, NOT a pressure injury  Present on Admission: Yes     Pressure Injury 12/31/17 Stage II -  Partial thickness loss of dermis presenting as a shallow open ulcer with a red, pink wound bed without slough. these are partial thickness wounds, NOT pressure injuries (Active)  12/31/17 1453  Location: Back  Location Orientation: Right  Staging: Stage II -  Partial thickness loss of dermis presenting as a shallow open ulcer with a red, pink wound bed without slough.  Wound Description (Comments): these are partial thickness wounds, NOT pressure injuries  Present on Admission: Yes    Estimated body mass index is 26.03 kg/m as calculated from the following:   Height as of this encounter: 5\' 10"  (1.778 m).   Weight as of this encounter: 82.3 kg.   DVT prophylaxis: lovenox Family Communication: I have spoken to his wife over the phone Disposition Plan/Barrier to D/C: Unable to determine Code Status:     Code Status Orders  (From admission, onward)         Start     Ordered   05/16/2018 1722  Full code  Continuous     05/07/2018 1725        Code Status History    Date Active Date  Inactive Code Status Order ID Comments User Context   12/31/2017 1647 01/10/2018 1927 Full Code [id number]  Merton Border, MD Inpatient   11/28/2017 1734 12/01/2017 2028 Full Code [id number]  Karmen Bongo, MD ED   07/07/2017 1512 07/11/2017 1650 Full Code [id number]  Radene Gunning, NP ED   04/23/2017 0130 04/25/2017 1858 Full Code [id number]  Jani Gravel, MD ED   01/21/2017 1535 01/25/2017 1702 Full Code [id number]  Debbe Odea, MD ED   11/24/2015 1106 11/24/2015 1901 Full Code [id number]  Bensimhon, Shaune Pascal, MD Inpatient   04/16/2015 1122 04/18/2015 1439 Full Code [id number]  Radene Gunning, NP ED   08/21/2014 1219 08/22/2014 1547 Full Code [id number]  Wellington Hampshire, MD Inpatient   08/20/2014 1339 08/21/2014 1219 Full Code [id number]  Consuelo Pandy, PA-C Inpatient   08/20/2014 0924 08/20/2014 1339 Full Code [id number]  Jolaine Artist, MD Inpatient   05/11/2014 0344 05/13/2014 2141 Full Code [id number]  Deneise Lever, MD ED   04/11/2013 1509 04/12/2013 1643 Full Code [id number]  Charlynne Cousins, MD Inpatient        IV Access:    Peripheral IV   Procedures and diagnostic studies:   No results found.   Medical Consultants:    None.  Anti-Infectives:  IV vancomycin and cefepime  Subjective:    [name] has no new complaints today,  Objective:    Vitals:   05/26/18 1900 05/26/18 2300 05/27/18 0412 05/27/18 0500  BP: 117/82  114/71   Pulse: (!) 109     Resp:      Temp: (!) 97.5 F (36.4 C) 98.4 F (36.9 C) (!) 101 F (38.3 C)   TempSrc: Oral Oral Axillary   SpO2: 94%     Weight:    82.3 kg  Height:        Intake/Output Summary (Last 24 hours) at 05/27/2018 0745 Last data filed at 05/27/2018 0500 Gross per 24 hour  Intake 355 ml  Output 1625 ml  Net -1270 ml   Filed Weights   05/25/18 0425 05/26/18 0600 05/27/18 0500  Weight: 85.1 kg 84.2 kg 82.3 kg    Exam: General exam: In no acute distress. Respiratory system: Good air movement and clear to  auscultation. Cardiovascular system: S1 & S2 heard, RRR.  Gastrointestinal system: Abdomen is nondistended, soft and nontender.  Central nervous system: Alert and oriented. No focal neurological deficits. Extremities: No pedal edema. Skin: No rashes, lesions or ulcers     Data Reviewed:    Labs: Basic Metabolic Panel: Recent Labs  Lab 05/23/18 0457 05/24/18 0730 05/25/18 0520 05/26/18 0600 05/27/18 0500  NA 139 137 138 140 139  K 3.9 3.2* 3.3* 3.0* 2.9*  CL 110 109 109 110 109  CO2 23 19* 17* 20* 18*  GLUCOSE 66* 79 98 91 90  BUN 18 19 22 18 17   CREATININE 1.23 1.02 1.26* 1.05 1.14  CALCIUM 7.9* 7.8* 7.9* 7.8* 7.9*  MG 1.9 1.7 1.7 1.9 1.8  PHOS 3.7 2.6  --   --   --    GFR Estimated Creatinine Clearance: 56 mL/min (by C-G formula based on SCr of 1.14 mg/dL). Liver Function Tests: Recent Labs  Lab 05/23/18 0457 05/24/18 0730 05/25/18 0520 05/26/18 0600 05/27/18 0500  AST 20 21 28 28 28   ALT 22 23 26 25 23   ALKPHOS 64 61 65 62 65  BILITOT 0.7 0.8 0.8 0.8 1.0  PROT 5.3* 5.8* 6.1* 6.3* 6.3*  ALBUMIN 2.5* 2.7* 2.7* 2.7* 2.6*   No results for input(s): LIPASE, AMYLASE in the last 168 hours. No results for input(s): AMMONIA in the last 168 hours. Coagulation profile Recent Labs  Lab 05/23/18 0457 05/24/18 0730 05/25/18 0520 05/26/18 0600 05/27/18 0500  INR 2.5* 2.2* 2.2* 3.4* 3.8*    CBC: Recent Labs  Lab 05/23/18 0457 05/24/18 0730 05/25/18 0520 05/26/18 0600 05/27/18 0500  WBC 11.3* 11.0* 11.2* 9.3 10.3  NEUTROABS 7.1 7.8* 8.0* 6.6 7.6  HGB 13.6 13.5 13.9 14.3 15.1  HCT 42.1 40.5 43.3 43.1 47.8  MCV 89.6 88.4 88.5 88.5 90.4  PLT 203 228 241 222 239   Cardiac Enzymes: Recent Labs  Lab 05/23/18 0457  CKTOTAL 62   BNP (last 3 results) No results for input(s): PROBNP in the last 8760 hours. CBG: Recent Labs  Lab 05/23/18 1202 05/23/18 1706 05/23/18 2107 05/24/18 0035 05/24/18 0342  GLUCAP 79 87 143* 83 95   D-Dimer: Recent Labs     05/26/18 0600 05/27/18 0500  DDIMER 0.75* 1.18*   Hgb A1c: No results for input(s): HGBA1C in the last 72 hours. Lipid Profile: No results for input(s): CHOL, HDL, LDLCALC, TRIG, CHOLHDL, LDLDIRECT in the last 72 hours. Thyroid function studies: No results for input(s): TSH, T4TOTAL, T3FREE, THYROIDAB in  the last 72 hours.  Invalid input(s): FREET3 Anemia work up: Recent Labs    05/26/18 0600 05/27/18 0500  FERRITIN 143 218   Sepsis Labs: Recent Labs  Lab 04/29/2018 1208 05/07/2018 1920  05/24/18 0730 05/25/18 0520 05/26/18 0600 05/27/18 0500  PROCALCITON 0.15  --   --  0.36 0.92 0.45  --   WBC 11.7*  --    < > 11.0* 11.2* 9.3 10.3  LATICACIDVEN 1.9 1.6  --   --   --   --   --    < > = values in this interval not displayed.   Microbiology Recent Results (from the past 240 hour(s))  SARS Coronavirus 2 [facility] order, Performed in [facility] hospital lab)     Status: Abnormal   Collection Time: 05/05/2018 12:10 PM  Result Value Ref Range Status   SARS Coronavirus 2 POSITIVE (A) NEGATIVE Final    Comment: RESULT CALLED TO, READ BACK BY AND VERIFIED WITH: RN Lowell Bouton [id number] MLM (NOTE) If result is NEGATIVE SARS-CoV-2 target nucleic acids are NOT DETECTED. The SARS-CoV-2 RNA is generally detectable in upper and lower  respiratory specimens during the acute phase of infection. The lowest  concentration of SARS-CoV-2 viral copies this assay can detect is 250  copies / mL. A negative result does not preclude SARS-CoV-2 infection  and should not be used as the sole basis for treatment or other  patient management decisions.  A negative result may occur with  improper specimen collection / handling, submission of specimen other  than nasopharyngeal swab, presence of viral mutation(s) within the  areas targeted by this assay, and inadequate number of viral copies  (<250 copies / mL). A negative result must be combined with clinical  observations, patient history, and  epidemiological information. If result is POSITIVE SARS-CoV-2 target nucleic acids are DETECTED. The SARS-Co V-2 RNA is generally detectable in upper and lower  respiratory specimens during the acute phase of infection.  Positive  results are indicative of active infection with SARS-CoV-2.  Clinical  correlation with patient history and other diagnostic information is  necessary to determine patient infection status.  Positive results do  not rule out bacterial infection or co-infection with other viruses. If result is PRESUMPTIVE POSTIVE SARS-CoV-2 nucleic acids MAY BE PRESENT.   A presumptive positive result was obtained on the submitted specimen  and confirmed on repeat testing.  While 2019 novel coronavirus  (SARS-CoV-2) nucleic acids may be present in the submitted sample  additional confirmatory testing may be necessary for epidemiological  and / or clinical management purposes  to differentiate between  SARS-CoV-2 and other Sarbecovirus currently known to infect humans.  If clinically indicated additional testing with an alternate test  methodology [phone]) is advised.  The SARS-CoV-2 RNA is generally  detectable in upper and lower respiratory specimens during the acute  phase of infection. The expected result is Negative. Fact Sheet for Patients:  StrictlyIdeas.no Fact Sheet for Healthcare Providers: BankingDealers.co.za This test is not yet approved or cleared by the [place] FDA and has been authorized for detection and/or diagnosis of SARS-CoV-2 by FDA under an Emergency Use Authorization (EUA).  This EUA will remain in effect (meaning this test can be used) for the duration of the COVID-19 declaration under Section 564(b)(1) of the Act, 21 U.S.C. section 360bbb-3(b)(1), unless the authorization is terminated or revoked sooner. Performed at [facility], [place] [address] [place], [place] [address]   Blood Culture  (routine x  2)     Status: None (Preliminary result)   Collection Time: 05/05/2018 12:16 PM  Result Value Ref Range Status   Specimen Description BLOOD RIGHT ANTECUBITAL  Final   Special Requests   Final    BOTTLES DRAWN AEROBIC AND ANAEROBIC Blood Culture adequate volume   Culture   Final    NO GROWTH 4 DAYS Performed at [facility], [place] [address], [place], [place] [address]    Report Status PENDING  Incomplete  Blood Culture (routine x 2)     Status: None (Preliminary result)   Collection Time: 05/14/2018  6:42 PM  Result Value Ref Range Status   Specimen Description BLOOD LEFT ANTECUBITAL  Final   Special Requests   Final    BOTTLES DRAWN AEROBIC ONLY Blood Culture results may not be optimal due to an inadequate volume of blood received in culture bottles   Culture   Final    NO GROWTH 4 DAYS Performed at [facility], [place] [address], [place], [place] [address]    Report Status PENDING  Incomplete  MRSA PCR Screening     Status: None   Collection Time: 05/23/18 10:45 AM  Result Value Ref Range Status   MRSA by PCR NEGATIVE NEGATIVE Final    Comment:        The GeneXpert MRSA Assay (FDA approved for NASAL specimens only), is one component of a comprehensive MRSA colonization surveillance program. It is not intended to diagnose MRSA infection nor to guide or monitor treatment for MRSA infections. Performed at [facility], [place] [address], [place], [place] [address]   C Difficile Quick Screen w PCR reflex     Status: None   Collection Time: 05/24/18  4:52 PM  Result Value Ref Range Status   C Diff antigen NEGATIVE NEGATIVE Final   C Diff toxin NEGATIVE NEGATIVE Final   C Diff interpretation No C. difficile detected.  Final    Comment: Performed at [facility], [place] [address] [place], [place] [address]     Medications:   . amLODipine  5 mg Oral Daily  . azithromycin  500 mg Oral Daily  . carvedilol  6.25 mg Oral BID WC  .  cholecalciferol  1,000 Units Oral Daily  . divalproex  125 mg Oral TID  . furosemide  20 mg Oral Daily  . hydrocortisone  10 mg Oral Q breakfast   And  . hydrocortisone  20 mg Oral QAC lunch   And  . hydrocortisone  20 mg Oral QHS  . potassium chloride  40 mEq Oral TID  . psyllium  1 packet Oral Daily  . vitamin C  500 mg Oral Daily  . Warfarin - Pharmacist Dosing Inpatient   Does not apply q1800  . zinc sulfate  220 mg Oral Daily   Continuous Infusions: . sodium chloride 10 mL/hr at 05/25/18 1800  . ceFEPime (MAXIPIME) IV 2 g (05/27/18 0312)  . vancomycin 1,750 mg (05/26/18 1332)      LOS: 5 days   [facility]  05/27/2018, 7:45 AM

## 2018-05-27 NOTE — Progress Notes (Signed)
[place] for warfarin Indication: VTE history    Assessment: 26 yom on warfarin PTA for PE/DVT history presenting with AMS, sepsis due to COVID positive. CVA work-up initiated.   Pharmacy consulted to dose warfarin inpatient. Noted remote hx of GIB, but no active bleed issues documented.  INR today has increased to 3.8 today despite holding dose yesterday (likely due to poor intake). H/H and plt stable   PTA warfarin dose: 5mg  daily except 2.5mg  on Tues/Fri (last dose 4/26 PTA)  Goal of Therapy:  INR 2-3 Monitor platelets by anticoagulation protocol: Yes   Plan:  Hold warfarin today   Daily INR Monitor CBC, s/sx bleeding   Albertina Parr, PharmD., BCPS Clinical Pharmacist Clinical phone for 05/27/18 until 4pm: [phone] If after 4pm, please refer to [facility] for unit-specific pharmacist

## 2018-05-27 NOTE — Progress Notes (Signed)
Woke patient to perform neuro exam.  Patients only response was no and would not participate in answering questions or performing commands.  Patient went back to sleep.

## 2018-05-28 LAB — FERRITIN
Ferritin: 283 ng/mL (ref 24–336)
Ferritin: 85 ng/mL (ref 24–336)

## 2018-05-28 LAB — CBC WITH DIFFERENTIAL/PLATELET
Abs Immature Granulocytes: 0.05 10*3/uL (ref 0.00–0.07)
Basophils Absolute: 0 10*3/uL (ref 0.0–0.1)
Basophils Relative: 0 %
Eosinophils Absolute: 0 10*3/uL (ref 0.0–0.5)
Eosinophils Relative: 0 %
HCT: 46 % (ref 39.0–52.0)
Hemoglobin: 14.6 g/dL (ref 13.0–17.0)
Immature Granulocytes: 1 %
Lymphocytes Relative: 21 %
Lymphs Abs: 2 10*3/uL (ref 0.7–4.0)
MCH: 28.4 pg (ref 26.0–34.0)
MCHC: 31.7 g/dL (ref 30.0–36.0)
MCV: 89.5 fL (ref 80.0–100.0)
Monocytes Absolute: 0.7 10*3/uL (ref 0.1–1.0)
Monocytes Relative: 7 %
Neutro Abs: 6.7 10*3/uL (ref 1.7–7.7)
Neutrophils Relative %: 71 %
Platelets: 213 10*3/uL (ref 150–400)
RBC: 5.14 MIL/uL (ref 4.22–5.81)
RDW: 16.9 % — ABNORMAL HIGH (ref 11.5–15.5)
WBC: 9.4 10*3/uL (ref 4.0–10.5)
nRBC: 0 % (ref 0.0–0.2)

## 2018-05-28 LAB — COMPREHENSIVE METABOLIC PANEL
ALT: 17 U/L (ref 0–44)
AST: 29 U/L (ref 15–41)
Albumin: 2.1 g/dL — ABNORMAL LOW (ref 3.5–5.0)
Alkaline Phosphatase: 49 U/L (ref 38–126)
Anion gap: 12 (ref 5–15)
BUN: 17 mg/dL (ref 8–23)
CO2: 14 mmol/L — ABNORMAL LOW (ref 22–32)
Calcium: 7.4 mg/dL — ABNORMAL LOW (ref 8.9–10.3)
Chloride: 115 mmol/L — ABNORMAL HIGH (ref 98–111)
Creatinine, Ser: 1.03 mg/dL (ref 0.61–1.24)
GFR calc Af Amer: >60 mL/min (ref >60)
GFR calc non Af Amer: >60 mL/min (ref >60)
Glucose, Bld: 58 mg/dL — ABNORMAL LOW (ref 70–99)
Potassium: 3.7 mmol/L (ref 3.5–5.1)
Sodium: 141 mmol/L (ref 135–145)
Total Bilirubin: 0.5 mg/dL (ref 0.3–1.2)
Total Protein: 5 g/dL — ABNORMAL LOW (ref 6.5–8.1)

## 2018-05-28 LAB — D-DIMER, QUANTITATIVE: D-Dimer, Quant: 1.25 ug/mL-FEU — ABNORMAL HIGH (ref 0.00–0.50)

## 2018-05-28 LAB — PROCALCITONIN: Procalcitonin: 0.26 ng/mL

## 2018-05-28 LAB — MAGNESIUM: Magnesium: 2 mg/dL (ref 1.7–2.4)

## 2018-05-28 LAB — C-REACTIVE PROTEIN: CRP: 18.3 mg/dL — ABNORMAL HIGH (ref <1.0)

## 2018-05-28 LAB — PROTIME-INR
INR: 4.2 (ref 0.8–1.2)
Prothrombin Time: 39.8 seconds — ABNORMAL HIGH (ref 11.4–15.2)

## 2018-05-28 MED ORDER — POTASSIUM CL IN DEXTROSE 5% 20 MEQ/L IV SOLN
20.0000 meq | INTRAVENOUS | Status: DC
  Administered 2018-05-28: 20 meq via INTRAVENOUS
  Filled 2018-05-28 (×2): qty 1000

## 2018-05-28 MED ORDER — HYDROCORTISONE NA SUCCINATE PF 100 MG IJ SOLR
100.0000 mg | Freq: Three times a day (TID) | INTRAMUSCULAR | Status: DC
  Administered 2018-05-28 – 2018-06-02 (×16): 100 mg via INTRAVENOUS
  Filled 2018-05-28 (×17): qty 2

## 2018-05-28 MED ORDER — ACETAMINOPHEN 325 MG PO TABS
650.0000 mg | ORAL_TABLET | ORAL | Status: DC | PRN
  Administered 2018-05-28 – 2018-06-03 (×4): 650 mg via ORAL
  Filled 2018-05-28 (×4): qty 2

## 2018-05-28 NOTE — Progress Notes (Signed)
Pts mentation is very labile and difficult to track or understand. He will be alert, speaking one or two word responses or nodding his head in response. Then in the next minute he will only stare and no longer verbally or physically responds to commands or questions. His food intake continues to be poor and his fluid intake has been worse today. Will continue to monitor.
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
## 2018-05-28 NOTE — Progress Notes (Signed)
[place] for warfarin Indication: VTE history    Assessment: 14 yom on warfarin PTA for PE/DVT history presenting with AMS, sepsis due to COVID positive. CVA work-up initiated. Noted remote hx of GIB, but no active bleed issues documented.  INR today has increased to 4.2 today despite holding dose for 2 days in a row (likely due to poor intake). H/H and plt stable on 5/2. Per RN no s/s of overt bleeding.   PTA warfarin dose: 5mg  daily except 2.5mg  on Tues/Fri (last dose 4/26 PTA)  Goal of Therapy:  INR 2-3 Monitor platelets by anticoagulation protocol: Yes   Plan:  Hold warfarin today   Daily INR Monitor CBC, s/sx bleeding   Albertina Parr, PharmD., BCPS Clinical Pharmacist Clinical phone for 05/28/18 until 4pm: [phone] If after 4pm, please refer to [facility] for unit-specific pharmacist

## 2018-05-28 NOTE — Progress Notes (Addendum)
Spoke with Carlyon Shadow, pts spouse, as well as pts son and brother who called for updates. I explained the current phone call policy and suggested they try a family zoom call so that they can see the pt.

## 2018-05-28 NOTE — Progress Notes (Addendum)
[facility] PROGRESS NOTE    Progress Note  SOTA HETZ  TGG:[id number] DOB: 03-Dec-1940 DOA: 04/28/2018 PCP: Lawerance Cruel, MD     Brief Narrative:   [name] is an 78 y.o. male past medical history of diastolic heart failure, history of GI bleed, dementia anemia of chronic disease PE on Coumadin found to be lethargic facility on the day of admission.  Code stroke was called admission, TPA was not given he was found to be febrile hypotensive tachycardic and hypoxic in the ED CT of the head negative for stroke, CT Angie of the head and neck showed no significant stenosis. Patient was found to have COVID-19 positive PCR. Comes from home.  Subjective:   Patient in bed, appears comfortable, denies any headache, no fever, no chest pain or pressure, no shortness of breath , no abdominal pain. No focal weakness.  Assessment/Plan:   Sirs secondary to COVID-19: Acute respiratory failure with hypoxia due to COVID-19 virus infection -   His respiratory failure has improved he is currently between 2 to 3 L nasal cannula oxygen, his main issue now seems to be generalized global decline, he has been eating and drinking minimally, at baseline is bedbound with multiple bedsores, moderately demented but did have good conversations with wife on a regular basis.  Continue aggressive supportive care, will try to switch him to stress dose steroids as he has underlying history of Addison's to see if it makes any improvement in his global decline.  If decreased oral intake continues (which has been an issue per staff he has not been eating or drinking well for the last 3 to 4 days) will focus on comfort.  Plan discussed with wife.  Do not think he has a bacterial infection his procalcitonin has been 0.26.  Stop all antibiotics on 05/28/18.  Acute metabolic encephalopathy with right-sided weakness: MRI brain was negative recently, this appears to be nonspecific encephalopathy as explained in  #1 above.  See changes above.  Minimize narcotics and benzodiazepines.  For his stroke he is on Coumadin pharmacy monitoring INR.  Adrenal insufficiency/Addison's disease: Switch to stress dose steroids on 05/28/2018 will monitor.  Chronic diastolic heart failure: Has been diuresed earlier, now on Coreg we will continue to monitor.  Hypokalemia/hypomagnesemia: According to the nurse the patient has been taking his oral potassium. Repeat a basic metabolic panel in the morning.  Diabetes mellitus type 2: A1c was 5.8. Excellent controlled continue sliding scale insulin.  History of PE on Coumadin: Continue current medication. Is currently therapeutic.  Hypothyroidism: Continue Synthroid.  History of dementia without behavioral disturbances: No events overnight continue to oral Depakote.  New onset Diarrhea: Diarrhea has resolved will discontinue his Flexi-Seal.  New non-anion gap metabolic acidosis: His diarrhea has resolved, his potassium is low his bicarbonate is also low.  I am concerned about renal tubular acidosis type II, although he is not hyperchloremic continue bicarbonate tablets. Per the nurse the patient has been taking his bicarbonate tablets.  Chronic kidney disease stage III: His creatinine has returned to baseline. We will hold Lasix for today as his calcium is low even with repletion.  Present on admission various pressure ulcer anywhere from stage II to stage III: Turn patient every 2 hours.  RN Pressure Injury Documentation: Pressure Injury 12/31/17 Unstageable - Full thickness tissue loss in which the base of the ulcer is covered by slough (yellow, tan, gray, green or brown) and/or eschar (tan, brown or black) in the wound bed. 2  areas of unstageable pressure injuries (Active)  12/31/17 1451  Location: Hip  Location Orientation: Right  Staging: Unstageable - Full thickness tissue loss in which the base of the ulcer is covered by slough (yellow, tan, gray,  green or brown) and/or eschar (tan, brown or black) in the wound bed.  Wound Description (Comments): 2 areas of unstageable pressure injuries  Present on Admission: Yes     Pressure Injury 12/31/17 Stage III -  Full thickness tissue loss. Subcutaneous fat may be visible but bone, tendon or muscle are NOT exposed. this is a full thickness wound, NOT a pressure injury (Active)  12/31/17 1452  Location: Foot  Location Orientation: Left  Staging: Stage III -  Full thickness tissue loss. Subcutaneous fat may be visible but bone, tendon or muscle are NOT exposed.  Wound Description (Comments): this is a full thickness wound, NOT a pressure injury  Present on Admission: Yes     Pressure Injury 12/31/17 Stage II -  Partial thickness loss of dermis presenting as a shallow open ulcer with a red, pink wound bed without slough. these are partial thickness wounds, NOT pressure injuries (Active)  12/31/17 1453  Location: Back  Location Orientation: Right  Staging: Stage II -  Partial thickness loss of dermis presenting as a shallow open ulcer with a red, pink wound bed without slough.  Wound Description (Comments): these are partial thickness wounds, NOT pressure injuries  Present on Admission: Yes    Estimated body mass index is 26.38 kg/m as calculated from the following:   Height as of this encounter: 5\' 10"  (1.778 m).   Weight as of this encounter: 83.4 kg.   DVT prophylaxis: lovenox Family Communication: I have spoken to his wife over the phone on 05/28/18 Disposition Plan/Barrier to D/C: SNF  Code Status:  DNR   IV Access:    Peripheral IV   Procedures and diagnostic studies:   No results found.   Medical Consultants:    None.  Anti-Infectives:   Antibiotics Given (last 72 hours)    Date/Time Action Medication Dose Rate   05/25/18 1142 New Bag/Given   vancomycin (VANCOCIN) 1,750 mg in sodium chloride 0.9 % 500 mL IVPB 1,750 mg 250 mL/hr   05/25/18 1703 New Bag/Given    ceFEPIme (MAXIPIME) 2 g in sodium chloride 0.9 % 100 mL IVPB 2 g 200 mL/hr   05/26/18 0411 New Bag/Given   ceFEPIme (MAXIPIME) 2 g in sodium chloride 0.9 % 100 mL IVPB 2 g 200 mL/hr   05/26/18 0834 Given   azithromycin (ZITHROMAX) tablet 500 mg 500 mg    05/26/18 1332 New Bag/Given   vancomycin (VANCOCIN) 1,750 mg in sodium chloride 0.9 % 500 mL IVPB 1,750 mg 250 mL/hr   05/26/18 1635 New Bag/Given   ceFEPIme (MAXIPIME) 2 g in sodium chloride 0.9 % 100 mL IVPB 2 g 200 mL/hr   05/27/18 9476 New Bag/Given   ceFEPIme (MAXIPIME) 2 g in sodium chloride 0.9 % 100 mL IVPB 2 g 200 mL/hr   05/27/18 0803 Given   azithromycin (ZITHROMAX) tablet 500 mg 500 mg    05/27/18 1318 New Bag/Given   vancomycin (VANCOCIN) 1,750 mg in sodium chloride 0.9 % 500 mL IVPB 1,750 mg 250 mL/hr   05/27/18 1631 New Bag/Given   ceFEPIme (MAXIPIME) 2 g in sodium chloride 0.9 % 100 mL IVPB 2 g 200 mL/hr   05/28/18 0302 New Bag/Given   ceFEPIme (MAXIPIME) 2 g in sodium chloride 0.9 % 100 mL IVPB  2 g 200 mL/hr      Objective:    Vitals:   05/28/18 0637 05/28/18 0704 05/28/18 0814 05/28/18 1029  BP: (!) 128/57  140/79 140/79  Pulse:      Resp: (!) 25  (!) 26   Temp: 99.3 F (37.4 C)  99.2 F (37.3 C)   TempSrc:   Oral   SpO2:      Weight:  83.4 kg    Height:        Intake/Output Summary (Last 24 hours) at 05/28/2018 1137 Last data filed at 05/28/2018 1021 Gross per 24 hour  Intake 140 ml  Output 1580 ml  Net -1440 ml   Filed Weights   05/26/18 0600 05/27/18 0500 05/28/18 0704  Weight: 84.2 kg 82.3 kg 83.4 kg    Exam:  Somnolent, minimally responsive, unable to follow commands, condom catheter [place].AT,PERRAL Supple Neck,No JVD, No cervical lymphadenopathy appriciated.  Symmetrical Chest wall movement, Good air movement bilaterally, CTAB RRR,No Gallops, Rubs or new Murmurs, No Parasternal Heave +ve B.Sounds, Abd Soft, No tenderness, No organomegaly appriciated, No rebound - guarding or rigidity. No  Cyanosis, Clubbing or edema, No new Rash or bruise   Data Reviewed:    Labs: Basic Metabolic Panel: Recent Labs  Lab 05/23/18 0457 05/24/18 0730 05/25/18 0520 05/26/18 0600 05/27/18 0500 05/28/18 0455  NA 139 137 138 140 139 141  K 3.9 3.2* 3.3* 3.0* 2.9* 3.7  CL 110 109 109 110 109 115*  CO2 23 19* 17* 20* 18* 14*  GLUCOSE 66* 79 98 91 90 58*  BUN 18 19 22 18 17 17   CREATININE 1.23 1.02 1.26* 1.05 1.14 1.03  CALCIUM 7.9* 7.8* 7.9* 7.8* 7.9* 7.4*  MG 1.9 1.7 1.7 1.9 1.8 2.0  PHOS 3.7 2.6  --   --   --   --    GFR Estimated Creatinine Clearance: 62 mL/min (by C-G formula based on SCr of 1.03 mg/dL). Liver Function Tests: Recent Labs  Lab 05/24/18 0730 05/25/18 0520 05/26/18 0600 05/27/18 0500 05/28/18 0455  AST 21 28 28 28 29   ALT 23 26 25 23 17   ALKPHOS 61 65 62 65 49  BILITOT 0.8 0.8 0.8 1.0 0.5  PROT 5.8* 6.1* 6.3* 6.3* 5.0*  ALBUMIN 2.7* 2.7* 2.7* 2.6* 2.1*   No results for input(s): LIPASE, AMYLASE in the last 168 hours. No results for input(s): AMMONIA in the last 168 hours. Coagulation profile Recent Labs  Lab 05/24/18 0730 05/25/18 0520 05/26/18 0600 05/27/18 0500 05/28/18 0455  INR 2.2* 2.2* 3.4* 3.8* 4.2*    CBC: Recent Labs  Lab 05/24/18 0730 05/25/18 0520 05/26/18 0600 05/27/18 0500 05/28/18 0748  WBC 11.0* 11.2* 9.3 10.3 9.4  NEUTROABS 7.8* 8.0* 6.6 7.6 6.7  HGB 13.5 13.9 14.3 15.1 14.6  HCT 40.5 43.3 43.1 47.8 46.0  MCV 88.4 88.5 88.5 90.4 89.5  PLT 228 241 222 239 213   Cardiac Enzymes: Recent Labs  Lab 05/23/18 0457  CKTOTAL 62   BNP (last 3 results) No results for input(s): PROBNP in the last 8760 hours. CBG: Recent Labs  Lab 05/23/18 1202 05/23/18 1706 05/23/18 2107 05/24/18 0035 05/24/18 0342  GLUCAP 79 87 143* 83 95   D-Dimer: Recent Labs    05/27/18 0500 05/28/18 0455  DDIMER 1.18* 1.25*   Hgb A1c: No results for input(s): HGBA1C in the last 72 hours. Lipid Profile: No results for input(s): CHOL,  HDL, LDLCALC, TRIG, CHOLHDL, LDLDIRECT in the last 72 hours. Thyroid function  studies: No results for input(s): TSH, T4TOTAL, T3FREE, THYROIDAB in the last 72 hours.  Invalid input(s): FREET3 Anemia work up: Recent Labs    05/26/18 0600 05/27/18 0500  FERRITIN 143 218   Sepsis Labs: Recent Labs  Lab 05/18/2018 1208 05/05/2018 1920  05/24/18 0730 05/25/18 0520 05/26/18 0600 05/27/18 0500 05/28/18 0748  PROCALCITON 0.15  --   --  0.36 0.92 0.45  --  0.26  WBC 11.7*  --    < > 11.0* 11.2* 9.3 10.3 9.4  LATICACIDVEN 1.9 1.6  --   --   --   --   --   --    < > = values in this interval not displayed.   Microbiology Recent Results (from the past 240 hour(s))  SARS Coronavirus 2 [facility] order, Performed in [facility] hospital lab)     Status: Abnormal   Collection Time: 05/10/2018 12:10 PM  Result Value Ref Range Status   SARS Coronavirus 2 POSITIVE (A) NEGATIVE Final    Comment: RESULT CALLED TO, READ BACK BY AND VERIFIED WITH: RN Lowell Bouton [phone] (NOTE) If result is NEGATIVE SARS-CoV-2 target nucleic acids are NOT DETECTED. The SARS-CoV-2 RNA is generally detectable in upper and lower  respiratory specimens during the acute phase of infection. The lowest  concentration of SARS-CoV-2 viral copies this assay can detect is 250  copies / mL. A negative result does not preclude SARS-CoV-2 infection  and should not be used as the sole basis for treatment or other  patient management decisions.  A negative result may occur with  improper specimen collection / handling, submission of specimen other  than nasopharyngeal swab, presence of viral mutation(s) within the  areas targeted by this assay, and inadequate number of viral copies  (<250 copies / mL). A negative result must be combined with clinical  observations, patient history, and epidemiological information. If result is POSITIVE SARS-CoV-2 target nucleic acids are DETECTED. The SARS-Co V-2 RNA is generally detectable  in upper and lower  respiratory specimens during the acute phase of infection.  Positive  results are indicative of active infection with SARS-CoV-2.  Clinical  correlation with patient history and other diagnostic information is  necessary to determine patient infection status.  Positive results do  not rule out bacterial infection or co-infection with other viruses. If result is PRESUMPTIVE POSTIVE SARS-CoV-2 nucleic acids MAY BE PRESENT.   A presumptive positive result was obtained on the submitted specimen  and confirmed on repeat testing.  While 2019 novel coronavirus  (SARS-CoV-2) nucleic acids may be present in the submitted sample  additional confirmatory testing may be necessary for epidemiological  and / or clinical management purposes  to differentiate between  SARS-CoV-2 and other Sarbecovirus currently known to infect humans.  If clinically indicated additional testing with an alternate test  methodology [phone]) is advised.  The SARS-CoV-2 RNA is generally  detectable in upper and lower respiratory specimens during the acute  phase of infection. The expected result is Negative. Fact Sheet for Patients:  StrictlyIdeas.no Fact Sheet for Healthcare Providers: BankingDealers.co.za This test is not yet approved or cleared by the [place] FDA and has been authorized for detection and/or diagnosis of SARS-CoV-2 by FDA under an Emergency Use Authorization (EUA).  This EUA will remain in effect (meaning this test can be used) for the duration of the COVID-19 declaration under Section 564(b)(1) of the Act, 21 U.S.C. section 360bbb-3(b)(1), unless the authorization is terminated or revoked sooner. Performed at [facility], [place] [address] [place], [place] [address]   Blood Culture (routine x 2)     Status: None   Collection Time: 04/29/2018 12:16 PM  Result Value Ref Range Status   Specimen Description BLOOD RIGHT  ANTECUBITAL  Final   Special Requests   Final    BOTTLES DRAWN AEROBIC AND ANAEROBIC Blood Culture adequate volume   Culture   Final    NO GROWTH 5 DAYS Performed at [facility], [place] [address] [place], [place] [address]    Report Status 05/27/2018 FINAL  Final  Blood Culture (routine x 2)     Status: None   Collection Time: 05/15/2018  6:42 PM  Result Value Ref Range Status   Specimen Description BLOOD LEFT ANTECUBITAL  Final   Special Requests   Final    BOTTLES DRAWN AEROBIC ONLY Blood Culture results may not be optimal due to an inadequate volume of blood received in culture bottles   Culture   Final    NO GROWTH 5 DAYS Performed at [facility], [place] [address] [place], [place] [address]    Report Status 05/27/2018 FINAL  Final  MRSA PCR Screening     Status: None   Collection Time: 05/23/18 10:45 AM  Result Value Ref Range Status   MRSA by PCR NEGATIVE NEGATIVE Final    Comment:        The GeneXpert MRSA Assay (FDA approved for NASAL specimens only), is one component of a comprehensive MRSA colonization surveillance program. It is not intended to diagnose MRSA infection nor to guide or monitor treatment for MRSA infections. Performed at [facility], [place] [address], [place], [place] [address]   C Difficile Quick Screen w PCR reflex     Status: None   Collection Time: 05/24/18  4:52 PM  Result Value Ref Range Status   C Diff antigen NEGATIVE NEGATIVE Final   C Diff toxin NEGATIVE NEGATIVE Final   C Diff interpretation No C. difficile detected.  Final    Comment: Performed at [facility], [place] [address], [place], [place] [address]     Medications:   . amLODipine  5 mg Oral Daily  . carvedilol  6.25 mg Oral BID WC  . cholecalciferol  1,000 Units Oral Daily  . divalproex  125 mg Oral TID  . hydrocortisone sod succinate (SOLU-CORTEF) inj  100 mg Intravenous Q8H  . psyllium  1 packet Oral Daily  . sodium bicarbonate  650 mg  Oral QID  . vitamin C  500 mg Oral Daily  . Warfarin - Pharmacist Dosing Inpatient   Does not apply q1800  . zinc sulfate  220 mg Oral Daily   Continuous Infusions: . sodium chloride 250 mL (05/27/18 1316)      LOS: 6 days   [facility]  05/28/2018, 11:37 AM

## 2018-05-29 LAB — CBC
HCT: 45.4 % (ref 39.0–52.0)
Hemoglobin: 14.6 g/dL (ref 13.0–17.0)
MCH: 28.9 pg (ref 26.0–34.0)
MCHC: 32.2 g/dL (ref 30.0–36.0)
MCV: 89.7 fL (ref 80.0–100.0)
Platelets: 213 10*3/uL (ref 150–400)
RBC: 5.06 MIL/uL (ref 4.22–5.81)
RDW: 17 % — ABNORMAL HIGH (ref 11.5–15.5)
WBC: 8.2 10*3/uL (ref 4.0–10.5)
nRBC: 0 % (ref 0.0–0.2)

## 2018-05-29 LAB — BASIC METABOLIC PANEL
Anion gap: 8 (ref 5–15)
BUN: 22 mg/dL (ref 8–23)
CO2: 17 mmol/L — ABNORMAL LOW (ref 22–32)
Calcium: 7.8 mg/dL — ABNORMAL LOW (ref 8.9–10.3)
Chloride: 115 mmol/L — ABNORMAL HIGH (ref 98–111)
Creatinine, Ser: 1.22 mg/dL (ref 0.61–1.24)
GFR calc Af Amer: >60 mL/min (ref >60)
GFR calc non Af Amer: 57 mL/min — ABNORMAL LOW (ref >60)
Glucose, Bld: 127 mg/dL — ABNORMAL HIGH (ref 70–99)
Potassium: 3.2 mmol/L — ABNORMAL LOW (ref 3.5–5.1)
Sodium: 140 mmol/L (ref 135–145)

## 2018-05-29 LAB — D-DIMER, QUANTITATIVE: D-Dimer, Quant: 0.94 ug/mL-FEU — ABNORMAL HIGH (ref 0.00–0.50)

## 2018-05-29 LAB — C-REACTIVE PROTEIN: CRP: 16.7 mg/dL — ABNORMAL HIGH (ref <1.0)

## 2018-05-29 LAB — PHOSPHORUS: Phosphorus: 2.7 mg/dL (ref 2.5–4.6)

## 2018-05-29 LAB — FERRITIN: Ferritin: 309 ng/mL (ref 24–336)

## 2018-05-29 LAB — PROTIME-INR
INR: 4 — ABNORMAL HIGH (ref 0.8–1.2)
Prothrombin Time: 38.6 seconds — ABNORMAL HIGH (ref 11.4–15.2)

## 2018-05-29 LAB — BRAIN NATRIURETIC PEPTIDE: B Natriuretic Peptide: 43.2 pg/mL (ref 0.0–100.0)

## 2018-05-29 LAB — MAGNESIUM: Magnesium: 2.1 mg/dL (ref 1.7–2.4)

## 2018-05-29 MED ORDER — POTASSIUM CHLORIDE 10 MEQ/100ML IV SOLN
10.0000 meq | INTRAVENOUS | Status: AC
  Administered 2018-05-29 (×4): 10 meq via INTRAVENOUS
  Filled 2018-05-29 (×4): qty 100

## 2018-05-29 NOTE — Progress Notes (Signed)
Physical Therapy Treatment Patient Details Name: [name] MRN: [id number] DOB: 1940-04-29 Today's Date: 05/29/2018    History of Present Illness 78 yo male presenting from SNF with AMS. Tested COVID positive. PMH including CHF, h/o GI bleeding, Addison's disease, dementia, anemia chronic disease, and pulmonary embolus.     PT Comments    The patient is awake, very little verbalizations. Patiejnt demonstrates no balance responses and varies from flexor tone to extension. Continue mobility efforts for pulmonary hygiene and improve  Mobility, prevent loss of function.    Follow Up Recommendations  SNF;Supervision/Assistance - 24 hour     Equipment Recommendations  None recommended by PT    Recommendations for Other Services       Precautions / Restrictions Precautions Precautions: Fall Restrictions Weight Bearing Restrictions: No    Mobility  Bed Mobility Overal bed mobility: Needs Assistance Bed Mobility: Rolling;Sidelying to Sit;Sit to Sidelying Rolling: Max assist;+2 for physical assistance Sidelying to sit: +2 for physical assistance;Max assist     Sit to sidelying: Max assist;+2 for physical assistance;+2 for safety/equipment General bed mobility comments: Max A +2 for rolling side to side during toilet hygiene. Requiring Max A to elevate trunk into sitting and then Max A for sitting balance. Requiring max A +2 for managing trunk and BLEs in returning to bed. Use of bad to faciltiate hip movement towards and away from EOB  Transfers                 General transfer comment: Defered  Ambulation/Gait                 Stairs             Wheelchair Mobility    Modified Rankin (Stroke Patients Only)       Balance Overall balance assessment: Needs assistance Sitting-balance support: Single extremity supported;Feet supported Sitting balance-Leahy Scale: Zero   Postural control: Posterior lean                                   Cognition Arousal/Alertness: Awake/alert Behavior During Therapy: Flat affect Overall Cognitive Status: History of cognitive impairments - at baseline Area of Impairment: Orientation;Memory;Following commands;Problem solving                 Orientation Level: Disoriented to;Place;Time;Situation   Memory: Decreased recall of precautions;Decreased short-term memory Following Commands: Follows one step commands inconsistently;Follows one step commands with increased time     Problem Solving: Slow processing;Decreased initiation;Requires verbal cues;Requires tactile cues General Comments: Baseline dementia. Pt following simple commands and agreeable to therapy. Pt requiring increased time to respond to simple questions or commands.       Exercises Other Exercises Other Exercises: worked on trunk forward flexion as pt with posterior lean, pt able to assist with pulling himself forward; worked on truncal rotation to L/R and reaching towards L/R; wt bearing both on L and R elbow Other Exercises: AAROM/PROM of BUEs for increasing ROM and decrease edema. 10 reps each. hand, wrist, elbow, shoulder    General Comments General comments (skin integrity, edema, etc.): SpO2 90s on 5L O2.       Pertinent Vitals/Pain Pain Assessment: Faces Faces Pain Scale: Hurts a little bit Pain Location: when mobilizing Pain Descriptors / Indicators: Grimacing;Moaning Pain Intervention(s): Monitored during session    Home Living Family/patient expects to be discharged to:: Skilled nursing facility  Prior Function            PT Goals (current goals can now be found in the care plan section) Acute Rehab PT Goals Patient Stated Goal: Unstated by pt. wife wanting him safe and return to rehab Progress towards PT goals: Not progressing toward goals - comment(pt. is total assist of 2 for al mobility, as was PTA, limited gains expected/)    Frequency    Min  2X/week      PT Plan      Co-evaluation PT/OT/SLP Co-Evaluation/Treatment: Yes Reason for Co-Treatment: Complexity of the patient's impairments (multi-system involvement) PT goals addressed during session: Mobility/safety with mobility OT goals addressed during session: ADL's and self-care      AM-PAC PT "6 Clicks" Mobility   Outcome Measure  Help needed turning from your back to your side while in a flat bed without using bedrails?: Total Help needed moving from lying on your back to sitting on the side of a flat bed without using bedrails?: Total Help needed moving to and from a bed to a chair (including a wheelchair)?: Total Help needed standing up from a chair using your arms (e.g., wheelchair or bedside chair)?: Total Help needed to walk in hospital room?: Total Help needed climbing 3-5 steps with a railing? : Total 6 Click Score: 6    End of Session   Activity Tolerance: Patient tolerated treatment well Patient left: in bed;with call bell/phone within reach;with bed alarm set Nurse Communication: Mobility status;Need for lift equipment PT Visit Diagnosis: Unsteadiness on feet (R26.81);Muscle weakness (generalized) (M62.81);Difficulty in walking, not elsewhere classified (R26.2)     Time: 1552-0802 PT Time Calculation (min) (ACUTE ONLY): 30 min  Charges:  $Therapeutic Activity: 8-22 mins                     Tresa Endo PT Acute Rehabilitation Services Pager [phone] Office [phone]    Claretha Cooper 05/29/2018, 3:56 PM

## 2018-05-29 NOTE — Progress Notes (Signed)
[facility] PROGRESS NOTE    Progress Note  [name]  RJJ:[id number] DOB: April 21, 1940 DOA: 04/26/2018 PCP: Lawerance Cruel, MD     Brief Narrative:   [name] is an 78 y.o. male past medical history of diastolic heart failure, history of GI bleed, dementia anemia of chronic disease PE on Coumadin found to be lethargic facility on the day of admission.  Code stroke was called admission, TPA was not given he was found to be febrile hypotensive tachycardic and hypoxic in the ED CT of the head negative for stroke, CT Angie of the head and neck showed no significant stenosis. Patient was found to have COVID-19 positive PCR. Comes from home.  Subjective:   Patient in bed, appears comfortable, more awake and alert than on 05/28/2018, denies any headache or chest pain.  Assessment/Plan:   Sirs secondary to COVID-19: Acute respiratory failure with hypoxia due to COVID-19 virus infection -   His respiratory failure has improved he is currently between 2 to 3 L nasal cannula oxygen, his main issue now seems to be generalized global decline, he has been eating and drinking minimally, at baseline is bedbound with multiple bedsores, moderately demented but did have good conversations with wife on a regular basis. Bacterial infection ruled out all antibiotics were stopped on 05/28/2018.  He was quite somnolent and minimally responsive on 05/28/2018, likely due to underlying Addison's disease on top of his acute COVID infection, he was placed on stress dose IV steroids on 05/28/2018 with improvement.  Diet changed to soft, speech therapy to evaluate.  Continues to be quite sick with guarded prognosis but overall somewhat improved on 05/29/2018.  Speech also to evaluate.    COVID-19 Labs  Recent Labs    05/27/18 0500 05/28/18 0455 05/28/18 0749 05/28/18 1400 05/29/18 0731 05/29/18 0732  DDIMER 1.18* 1.25*  --   --   --  0.94*  FERRITIN 218  --  283 85 309  --   CRP 16.7*  --   --   18.3* 16.7*  --     Lab Results  Component Value Date   SARSCOV2NAA POSITIVE (A) 01/60/1093    Acute metabolic encephalopathy with right-sided weakness: MRI brain was negative recently, this appears to be nonspecific encephalopathy as explained in #1 above.  See changes above.  Minimize narcotics and benzodiazepines.  For his stroke he is on Coumadin pharmacy monitoring INR.  Adrenal insufficiency/Addison's disease: Switched to stress dose IV steroids on 05/28/2018 will monitor.  Chronic diastolic heart failure: Has been diuresed earlier, now on Coreg we will continue to monitor.  Hypokalemia : Replaced.  Diabetes mellitus type 2: A1c was 5.8. diet controlled.  History of PE on Coumadin: Continue current medication. Is currently therapeutic.  Hypothyroidism: Continue Synthroid.  History of dementia without behavioral disturbances: No events overnight continue to oral Depakote.  New onset Diarrhea: Diarrhea has resolved will discontinue his Flexi-Seal.  New non-anion gap metabolic acidosis: His diarrhea has resolved, his potassium is low his bicarbonate is also low.  I am concerned about renal tubular acidosis type II, although he is not hyperchloremic continue bicarbonate tablets. Per the nurse the patient has been taking his bicarbonate tablets.  Chronic kidney disease stage III: His creatinine has returned to baseline. We will hold Lasix for today as his calcium is low even with repletion.  Present on admission various pressure ulcer anywhere from stage II to stage III: Turn patient every 2 hours.  RN Pressure Injury Documentation: Pressure  Injury 12/31/17 Unstageable - Full thickness tissue loss in which the base of the ulcer is covered by slough (yellow, tan, gray, green or brown) and/or eschar (tan, brown or black) in the wound bed. 2 areas of unstageable pressure injuries (Active)  12/31/17 1451  Location: Hip  Location Orientation: Right  Staging: Unstageable -  Full thickness tissue loss in which the base of the ulcer is covered by slough (yellow, tan, gray, green or brown) and/or eschar (tan, brown or black) in the wound bed.  Wound Description (Comments): 2 areas of unstageable pressure injuries  Present on Admission: Yes     Pressure Injury 12/31/17 Stage III -  Full thickness tissue loss. Subcutaneous fat may be visible but bone, tendon or muscle are NOT exposed. this is a full thickness wound, NOT a pressure injury (Active)  12/31/17 1452  Location: Foot  Location Orientation: Left  Staging: Stage III -  Full thickness tissue loss. Subcutaneous fat may be visible but bone, tendon or muscle are NOT exposed.  Wound Description (Comments): this is a full thickness wound, NOT a pressure injury  Present on Admission: Yes     Pressure Injury 12/31/17 Stage II -  Partial thickness loss of dermis presenting as a shallow open ulcer with a red, pink wound bed without slough. these are partial thickness wounds, NOT pressure injuries (Active)  12/31/17 1453  Location: Back  Location Orientation: Right  Staging: Stage II -  Partial thickness loss of dermis presenting as a shallow open ulcer with a red, pink wound bed without slough.  Wound Description (Comments): these are partial thickness wounds, NOT pressure injuries  Present on Admission: Yes    Estimated body mass index is 26.54 kg/m as calculated from the following:   Height as of this encounter: 5\' 10"  (1.778 m).   Weight as of this encounter: 83.9 kg.   DVT prophylaxis: lovenox Family Communication: I have spoken to his wife over the phone on 05/28/18, 05/29/18 Disposition Plan/Barrier to D/C: SNF  Code Status:  DNR   IV Access:    Peripheral IV   Procedures and diagnostic studies:   No results found.   Medical Consultants:    None.  Anti-Infectives:   Antibiotics Given (last 72 hours)    Date/Time Action Medication Dose Rate   05/26/18 1332 New Bag/Given   vancomycin  (VANCOCIN) 1,750 mg in sodium chloride 0.9 % 500 mL IVPB 1,750 mg 250 mL/hr   05/26/18 1635 New Bag/Given   ceFEPIme (MAXIPIME) 2 g in sodium chloride 0.9 % 100 mL IVPB 2 g 200 mL/hr   05/27/18 4627 New Bag/Given   ceFEPIme (MAXIPIME) 2 g in sodium chloride 0.9 % 100 mL IVPB 2 g 200 mL/hr   05/27/18 0803 Given   azithromycin (ZITHROMAX) tablet 500 mg 500 mg    05/27/18 1318 New Bag/Given   vancomycin (VANCOCIN) 1,750 mg in sodium chloride 0.9 % 500 mL IVPB 1,750 mg 250 mL/hr   05/27/18 1631 New Bag/Given   ceFEPIme (MAXIPIME) 2 g in sodium chloride 0.9 % 100 mL IVPB 2 g 200 mL/hr   05/28/18 0302 New Bag/Given   ceFEPIme (MAXIPIME) 2 g in sodium chloride 0.9 % 100 mL IVPB 2 g 200 mL/hr      Objective:    Vitals:   05/29/18 0330 05/29/18 0400 05/29/18 0500 05/29/18 0800  BP:    (!) 149/89  Pulse:    98  Resp:      Temp: 98.4 F (36.9 C) (!)  97.3 F (36.3 C)    TempSrc:  Axillary    SpO2:    99%  Weight:   83.9 kg   Height:        Intake/Output Summary (Last 24 hours) at 05/29/2018 0950 Last data filed at 05/28/2018 1823 Gross per 24 hour  Intake 120 ml  Output 950 ml  Net -830 ml   Filed Weights   05/27/18 0500 05/28/18 0704 05/29/18 0500  Weight: 82.3 kg 83.4 kg 83.9 kg    Exam:   Patient in bed overall frail but more responsive than on 05/28/2018, has a condom catheter [place].AT,PERRAL Supple Neck,No JVD, No cervical lymphadenopathy appriciated.  Symmetrical Chest wall movement, Good air movement bilaterally, CTAB RRR,No Gallops, Rubs or new Murmurs, No Parasternal Heave +ve B.Sounds, Abd Soft, No tenderness, No organomegaly appriciated, No rebound - guarding or rigidity. No Cyanosis, Clubbing or edema, No new Rash or bruise   Data Reviewed:    Labs: Basic Metabolic Panel: Recent Labs  Lab 05/23/18 0457 05/24/18 0730 05/25/18 0520 05/26/18 0600 05/27/18 0500 05/28/18 0455 05/29/18 0732  NA 139 137 138 140 139 141 140  K 3.9 3.2* 3.3* 3.0* 2.9* 3.7 3.2*   CL 110 109 109 110 109 115* 115*  CO2 23 19* 17* 20* 18* 14* 17*  GLUCOSE 66* 79 98 91 90 58* 127*  BUN 18 19 22 18 17 17 22   CREATININE 1.23 1.02 1.26* 1.05 1.14 1.03 1.22  CALCIUM 7.9* 7.8* 7.9* 7.8* 7.9* 7.4* 7.8*  MG 1.9 1.7 1.7 1.9 1.8 2.0 2.1  PHOS 3.7 2.6  --   --   --   --  2.7   GFR Estimated Creatinine Clearance: 52.4 mL/min (by C-G formula based on SCr of 1.22 mg/dL). Liver Function Tests: Recent Labs  Lab 05/24/18 0730 05/25/18 0520 05/26/18 0600 05/27/18 0500 05/28/18 0455  AST 21 28 28 28 29   ALT 23 26 25 23 17   ALKPHOS 61 65 62 65 49  BILITOT 0.8 0.8 0.8 1.0 0.5  PROT 5.8* 6.1* 6.3* 6.3* 5.0*  ALBUMIN 2.7* 2.7* 2.7* 2.6* 2.1*   No results for input(s): LIPASE, AMYLASE in the last 168 hours. No results for input(s): AMMONIA in the last 168 hours. Coagulation profile Recent Labs  Lab 05/25/18 0520 05/26/18 0600 05/27/18 0500 05/28/18 0455 05/29/18 0501  INR 2.2* 3.4* 3.8* 4.2* 4.0*    CBC: Recent Labs  Lab 05/24/18 0730 05/25/18 0520 05/26/18 0600 05/27/18 0500 05/28/18 0748 05/29/18 0732  WBC 11.0* 11.2* 9.3 10.3 9.4 8.2  NEUTROABS 7.8* 8.0* 6.6 7.6 6.7  --   HGB 13.5 13.9 14.3 15.1 14.6 14.6  HCT 40.5 43.3 43.1 47.8 46.0 45.4  MCV 88.4 88.5 88.5 90.4 89.5 89.7  PLT 228 241 222 239 213 213   Cardiac Enzymes: Recent Labs  Lab 05/23/18 0457  CKTOTAL 62   BNP (last 3 results) No results for input(s): PROBNP in the last 8760 hours. CBG: Recent Labs  Lab 05/23/18 1202 05/23/18 1706 05/23/18 2107 05/24/18 0035 05/24/18 0342  GLUCAP 79 87 143* 83 95   D-Dimer: Recent Labs    05/28/18 0455 05/29/18 0732  DDIMER 1.25* 0.94*   Hgb A1c: No results for input(s): HGBA1C in the last 72 hours. Lipid Profile: No results for input(s): CHOL, HDL, LDLCALC, TRIG, CHOLHDL, LDLDIRECT in the last 72 hours. Thyroid function studies: No results for input(s): TSH, T4TOTAL, T3FREE, THYROIDAB in the last 72 hours.  Invalid input(s): FREET3  Anemia work up: Recent  Labs    05/28/18 1400 05/29/18 0731  FERRITIN 85 309   Sepsis Labs: Recent Labs  Lab 05/19/2018 1208 05/11/2018 1920  05/24/18 0730 05/25/18 0520 05/26/18 0600 05/27/18 0500 05/28/18 0748 05/29/18 0732  PROCALCITON 0.15  --   --  0.36 0.92 0.45  --  0.26  --   WBC 11.7*  --    < > 11.0* 11.2* 9.3 10.3 9.4 8.2  LATICACIDVEN 1.9 1.6  --   --   --   --   --   --   --    < > = values in this interval not displayed.   Microbiology Recent Results (from the past 240 hour(s))  SARS Coronavirus 2 [facility] order, Performed in [facility] hospital lab)     Status: Abnormal   Collection Time: 05/09/2018 12:10 PM  Result Value Ref Range Status   SARS Coronavirus 2 POSITIVE (A) NEGATIVE Final    Comment: RESULT CALLED TO, READ BACK BY AND VERIFIED WITH: RN Lowell Bouton [phone] (NOTE) If result is NEGATIVE SARS-CoV-2 target nucleic acids are NOT DETECTED. The SARS-CoV-2 RNA is generally detectable in upper and lower  respiratory specimens during the acute phase of infection. The lowest  concentration of SARS-CoV-2 viral copies this assay can detect is 250  copies / mL. A negative result does not preclude SARS-CoV-2 infection  and should not be used as the sole basis for treatment or other  patient management decisions.  A negative result may occur with  improper specimen collection / handling, submission of specimen other  than nasopharyngeal swab, presence of viral mutation(s) within the  areas targeted by this assay, and inadequate number of viral copies  (<250 copies / mL). A negative result must be combined with clinical  observations, patient history, and epidemiological information. If result is POSITIVE SARS-CoV-2 target nucleic acids are DETECTED. The SARS-Co V-2 RNA is generally detectable in upper and lower  respiratory specimens during the acute phase of infection.  Positive  results are indicative of active infection with SARS-CoV-2.  Clinical   correlation with patient history and other diagnostic information is  necessary to determine patient infection status.  Positive results do  not rule out bacterial infection or co-infection with other viruses. If result is PRESUMPTIVE POSTIVE SARS-CoV-2 nucleic acids MAY BE PRESENT.   A presumptive positive result was obtained on the submitted specimen  and confirmed on repeat testing.  While 2019 novel coronavirus  (SARS-CoV-2) nucleic acids may be present in the submitted sample  additional confirmatory testing may be necessary for epidemiological  and / or clinical management purposes  to differentiate between  SARS-CoV-2 and other Sarbecovirus currently known to infect humans.  If clinically indicated additional testing with an alternate test  methodology [phone]) is advised.  The SARS-CoV-2 RNA is generally  detectable in upper and lower respiratory specimens during the acute  phase of infection. The expected result is Negative. Fact Sheet for Patients:  StrictlyIdeas.no Fact Sheet for Healthcare Providers: BankingDealers.co.za This test is not yet approved or cleared by the [place] FDA and has been authorized for detection and/or diagnosis of SARS-CoV-2 by FDA under an Emergency Use Authorization (EUA).  This EUA will remain in effect (meaning this test can be used) for the duration of the COVID-19 declaration under Section 564(b)(1) of the Act, 21 U.S.C. section 360bbb-3(b)(1), unless the authorization is terminated or revoked sooner. Performed at [facility], [place] [address], [place], [place] [address]   Blood  Culture (routine x 2)     Status: None   Collection Time: 05/20/2018 12:16 PM  Result Value Ref Range Status   Specimen Description BLOOD RIGHT ANTECUBITAL  Final   Special Requests   Final    BOTTLES DRAWN AEROBIC AND ANAEROBIC Blood Culture adequate volume   Culture   Final    NO GROWTH 5 DAYS Performed at  [facility], [address], [place], [place] [address]    Report Status 05/27/2018 FINAL  Final  Blood Culture (routine x 2)     Status: None   Collection Time: 04/27/2018  6:42 PM  Result Value Ref Range Status   Specimen Description BLOOD LEFT ANTECUBITAL  Final   Special Requests   Final    BOTTLES DRAWN AEROBIC ONLY Blood Culture results may not be optimal due to an inadequate volume of blood received in culture bottles   Culture   Final    NO GROWTH 5 DAYS Performed at [facility], [place] [address], [place], [place] [address]    Report Status 05/27/2018 FINAL  Final  MRSA PCR Screening     Status: None   Collection Time: 05/23/18 10:45 AM  Result Value Ref Range Status   MRSA by PCR NEGATIVE NEGATIVE Final    Comment:        The GeneXpert MRSA Assay (FDA approved for NASAL specimens only), is one component of a comprehensive MRSA colonization surveillance program. It is not intended to diagnose MRSA infection nor to guide or monitor treatment for MRSA infections. Performed at [facility], [place] [address] [place], [place] [address]   C Difficile Quick Screen w PCR reflex     Status: None   Collection Time: 05/24/18  4:52 PM  Result Value Ref Range Status   C Diff antigen NEGATIVE NEGATIVE Final   C Diff toxin NEGATIVE NEGATIVE Final   C Diff interpretation No C. difficile detected.  Final    Comment: Performed at [facility], [place] [address], [place], [place] [address]     Medications:   . amLODipine  5 mg Oral Daily  . carvedilol  6.25 mg Oral BID WC  . cholecalciferol  1,000 Units Oral Daily  . divalproex  125 mg Oral TID  . hydrocortisone sod succinate (SOLU-CORTEF) inj  100 mg Intravenous Q8H  . psyllium  1 packet Oral Daily  . vitamin C  500 mg Oral Daily  . Warfarin - Pharmacist Dosing Inpatient   Does not apply q1800  . zinc sulfate  220 mg Oral Daily   Continuous Infusions: . sodium chloride 250 mL (05/27/18 1316)   . potassium chloride        LOS: 7 days   [facility]  05/29/2018, 9:50 AM

## 2018-05-29 NOTE — Progress Notes (Addendum)
  Speech Language Pathology Treatment: Dysphagia  Patient Details Name: [name] MRN: [id number] DOB: 1940-04-10 Today's Date: 05/29/2018 Time: 3428-7681 SLP Time Calculation (min) (ACUTE ONLY): 26 min  Assessment / Plan / Recommendation Clinical Impression  Followed-up to assist with dinner meal - pt received dysphagia 3/thins.  Attempted to eat chicken and green beans, but pocketed in left cheek.  Pt tolerated purees with intermittent verbal cues to swallow; drank iced tea from straw without difficulty.  Followed commands after max assist and with significant delay in response time; pt did not initiate any verbal communication; affect flat with poor spontaneity.  Required oral suctioning to remove chicken residue from left lateral sulcus after meal. Diet to be been changed to dysphagia 1; crush meds.  He will need full assist with feeding and cues to swallow due to cognitive deficits.  SLP will follow for safety/precautions.    HPI HPI: [name] is an 78 y.o. male with past medical history of diastolic heart failure, history of GI bleed, dementia, anemia, PE on Coumadin found to be lethargic 4/27, brought to ED from [facility].  Code stroke was called on admission, TPA was not given; he was found to be febrile ,hypotensive, tachycardic, and hypoxic in the ED. CT of the head negative for stroke, CT Angie of the head and neck showed no significant stenosis. Patient was found to have COVID-19 positive PCR. Now with generalized decline; minimal PO intake.        SLP Plan  Continue with current plan of care       Recommendations  Diet recommendations: Dysphagia 1 (puree);Thin liquid Liquids provided via: Cup;Straw Medication Administration: crush meds with puree Supervision: Full supervision/cueing for compensatory strategies Compensations: Minimize environmental distractions;Slow rate;Small sips/bites Postural Changes and/or Swallow Maneuvers: Seated upright 90 degrees                 Oral Care Recommendations: Oral care BID Follow up Recommendations: Skilled Nursing facility SLP Visit Diagnosis: Dysphagia, unspecified (R13.10) Plan: Continue with current plan of care       GO                [name] 05/29/2018, 5:18 PM  [name] L. Tivis Ringer, [facility] Office number [phone] Pager [phone]

## 2018-05-29 NOTE — Progress Notes (Signed)
Patient titrated down to 6L [place] from nonrebreather per MD. Called wife again and she spoke to him.He was still not verbally responsive and did not eat well. Able to take meds in apple sauce and drink a whole ensure

## 2018-05-29 NOTE — Progress Notes (Signed)
Spoke with wife to update about husband's condition. I will call back around 1800 so she can talk to him. Pt currently with OT.

## 2018-05-29 NOTE — Progress Notes (Signed)
Occupational Therapy Treatment Patient Details Name: [name] MRN: [id number] DOB: 1940/06/17 Today's Date: 05/29/2018    History of present illness 78 yo male presenting from SNF with AMS. Tested COVID positive. PMH including CHF, h/o GI bleeding, Addison's disease, dementia, anemia chronic disease, and pulmonary embolus.    OT comments  Pt continues to present with decreased activity tolerance, balance, and functional use of BUEs. Pt presenting with significant edema and tone in BUEs; facilitated PROM/AAROM of bilateral hands, wrist, elbows, and shoulders for ROM and edema management. Pt requiring Max A +2 for cleaning up after bowel incontinence in the bed. Required Max A +2 for bed mobility and sitting at EOB for ~15 minutes. Pt with posterior lean and poor sitting balance. Required Max hand over hand for washing his face. SpO2 >90% on 5L. Continue to recommend dc to SNF and will continue to follow acutely as admitted.    Follow Up Recommendations  SNF;Supervision/Assistance - 24 hour    Equipment Recommendations  None recommended by OT    Recommendations for Other Services PT consult    Precautions / Restrictions Precautions Precautions: Fall Restrictions Weight Bearing Restrictions: No       Mobility Bed Mobility Overal bed mobility: Needs Assistance Bed Mobility: Rolling;Sidelying to Sit;Sit to Sidelying Rolling: Max assist;+2 for physical assistance Sidelying to sit: +2 for physical assistance;Max assist     Sit to sidelying: Max assist;+2 for physical assistance;+2 for safety/equipment General bed mobility comments: Max A +2 for rolling side to side during toilet hygiene. Requiring Max A to elevate trunk into sitting and then Max A for sitting balance. Requiring max A +2 for managing trunk and BLEs in returning to bed. Use of bad to faciltiate hip movement towards and away from EOB  Transfers                 General transfer comment: Defered     Balance Overall balance assessment: Needs assistance Sitting-balance support: Single extremity supported;Feet supported Sitting balance-Leahy Scale: Zero   Postural control: Posterior lean                                 ADL either performed or assessed with clinical judgement   ADL Overall ADL's : Needs assistance/impaired     Grooming: Wash/dry face;Maximal assistance;Sitting Grooming Details (indicate cue type and reason): Pt sitting at EOB with Max A for sitting balance. Requiring Max hand over hand to assist pt in washing his face. Pt with decreased ROM and high tone at BUEs limiting his performance of grooming tasks. Also noting significant edema at [place].                        Toileting - Clothing Manipulation Details (indicate cue type and reason): Pt with bowel incontience in bed and requiring Max A +2 for cleaning at bed level. Pt rolling laterally left and right with Max A and cues for reaching towards bed rails.      Functional mobility during ADLs: (bed mobility only) General ADL Comments: Pt sitting at EOB for ~15 minutes with Max A for sitting balance.      Vision       Perception     Praxis      Cognition Arousal/Alertness: Awake/alert Behavior During Therapy: Flat affect Overall Cognitive Status: History of cognitive impairments - at baseline Area of Impairment: Orientation;Memory;Following commands;Problem solving  Orientation Level: Disoriented to;Place;Time;Situation   Memory: Decreased recall of precautions;Decreased short-term memory Following Commands: Follows one step commands inconsistently;Follows one step commands with increased time     Problem Solving: Slow processing;Decreased initiation;Requires verbal cues;Requires tactile cues General Comments: Baseline dementia. Pt following simple commands and agreeable to therapy. Pt requiring increased time to respond to simple questions or commands.          Exercises Exercises: Other exercises Other Exercises Other Exercises: worked on trunk forward flexion as pt with posterior lean, pt able to assist with pulling himself forward; worked on truncal rotation to L/R and reaching towards L/R; wt bearing both on L and R elbow Other Exercises: AAROM/PROM of BUEs for increasing ROM and decrease edema. 10 reps each. hand, wrist, elbow, shoulder   Shoulder Instructions       General Comments SpO2 90s on 5L O2.     Pertinent Vitals/ Pain       Pain Assessment: Faces Faces Pain Scale: Hurts a little bit Pain Location: when mobilizing Pain Descriptors / Indicators: Grimacing;Moaning Pain Intervention(s): Monitored during session;Limited activity within patient's tolerance;Repositioned  Home Living Family/patient expects to be discharged to:: Skilled nursing facility                                        Prior Functioning/Environment              Frequency  Min 2X/week        Progress Toward Goals  OT Goals(current goals can now be found in the care plan section)  Progress towards OT goals: Progressing toward goals  Acute Rehab OT Goals Patient Stated Goal: Unstated by pt. wife wanting him safe and return to rehab OT Goal Formulation: With patient/family Time For Goal Achievement: 06/06/18 Potential to Achieve Goals: Good ADL Goals Pt Will Perform Grooming: with set-up;sitting;with supervision Pt Will Perform Upper Body Bathing: with min assist;sitting Pt Will Transfer to Toilet: with mod assist;stand pivot transfer;bedside commode Additional ADL Goal #1: Pt will perform bed mobility with Min A in preparation for ADLs Additional ADL Goal #2: Pt will tolerate sitting at EOB for 5 minutes with Min A  Plan Discharge plan remains appropriate    Co-evaluation    PT/OT/SLP Co-Evaluation/Treatment: Yes Reason for Co-Treatment: Complexity of the patient's impairments (multi-system involvement);Necessary to  address cognition/behavior during functional activity;For patient/therapist safety;To address functional/ADL transfers   OT goals addressed during session: ADL's and self-care      AM-PAC OT "6 Clicks" Daily Activity     Outcome Measure   Help from another person eating meals?: A Lot Help from another person taking care of personal grooming?: A Lot Help from another person toileting, which includes using toliet, bedpan, or urinal?: Total Help from another person bathing (including washing, rinsing, drying)?: A Lot Help from another person to put on and taking off regular upper body clothing?: A Lot Help from another person to put on and taking off regular lower body clothing?: Total 6 Click Score: 10    End of Session Equipment Utilized During Treatment: Oxygen(5L)  OT Visit Diagnosis: Unsteadiness on feet (R26.81);Other abnormalities of gait and mobility (R26.89);Muscle weakness (generalized) (M62.81);Other symptoms and signs involving cognitive function   Activity Tolerance Patient tolerated treatment well   Patient Left in bed;with call bell/phone within reach;with bed alarm set   Nurse Communication Mobility status(BM)  Time: 0289-0228 OT Time Calculation (min): 34 min  Charges: OT General Charges $OT Visit: 1 Visit OT Treatments $Self Care/Home Management : 8-22 mins  Goodman, OTR/L Acute Rehab Pager: [phone] Office: [place] 05/29/2018, 3:31 PM

## 2018-05-29 NOTE — Progress Notes (Signed)
Titrated O2 to 3L [name]. Spoke with wife and wife spoke with patient. Pt is expressive and nods yes or no, but no speech present.

## 2018-05-29 NOTE — Progress Notes (Signed)
Spoke with patient's wife who was very upset and worried about her husband's condition today. She said she was surprised by the call she received explaining that he may need to be a palliative/comfort care patient.

## 2018-05-29 NOTE — Progress Notes (Signed)
Pt is alert and still nonverbal when asked questions.  Pt O2 dropped into the 60's.  Unable to get oxygen saturation up on 5L.  Placed non-rebreather on patient and spoke with on-call physician.  Will stop patients fluids and keep non-rebreather mask on.

## 2018-05-29 NOTE — Evaluation (Signed)
Clinical/Bedside Swallow Evaluation Patient Details  Name: [name] MRN: [id number] Date of Birth: 28-Jun-1940  Today's Date: 05/29/2018 Time: SLP Start Time (ACUTE ONLY): 70 SLP Stop Time (ACUTE ONLY): 1600 SLP Time Calculation (min) (ACUTE ONLY): 20 min  Past Medical History:  Past Medical History:  Diagnosis Date  . Acute lower GI bleeding    a. admitted to [facility] 04/11/2013;  b. 04/2014 EGD: 1.  gastric erosions, mild prox gastritis and bulbar duodenitis->Protonix.  . Addison's disease ([place])   . Anemia   . Anemia in CKD (chronic kidney disease) 06/25/2015  . Chronic back pain   . Chronic diastolic CHF (congestive heart failure) ([place])    a. His initial ejection fraction was between 30 and 35%;  b. 04/2013 Echo: EF 50-55% Gr1 DD, mild-mod MR;  c. 04/2014 Echo: EF 60-65%, Gr 1 DD, triv TR.  . CKD (chronic kidney disease), stage III ([place])   . Coronary artery disease    a. 07/2014 - s/p difficult PCI - had pressure wire analysis of LAD s/p DES in mid segment c/b wire-induced dissection in the distal segment of the LAD which was treated with repeated balloon inflations. Also had DES to rPDA - again had either spasm distal to the stent placement or an edge dissection but the vessel was small in that area; procedure aborted.  . Daily headache   . GERD (gastroesophageal reflux disease)   . H/O hiatal hernia   . Hepatitis    a. 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  . History of aortic insufficiency   . Hx of cardiovascular stress test    a. ETT-Myoview 7/14:  Low risk, inferior defect consistent with thinning, no ischemia, normal wall motion, EF 54%  . Hypertension   . Hypogonadism male   . Obesity   . Peptic ulcer disease   . Pulmonary embolism ([place])    a. 04/2014 CTA: Bilat submassive PE ->coumadin.  . Pulmonary HTN ([place])   . Thyroid disease    Past Surgical History:  Past Surgical History:  Procedure Laterality Date  . APPENDECTOMY  1950's  . CARDIAC CATHETERIZATION  06/26/2008   EF 30-35%  .  CARDIAC CATHETERIZATION N/A 08/20/2014   Procedure: Right/Left Heart Cath and Coronary Angiography;  Surgeon: Jolaine Artist, MD;  Location: [place] [facility];  Service: Cardiovascular;  Laterality: N/A;  . CARDIAC CATHETERIZATION N/A 08/21/2014   Procedure: Coronary Stent Intervention;  Surgeon: Wellington Hampshire, MD;  Location: [place] [facility];  Service: Cardiovascular;  Laterality: N/A;  . CARDIAC CATHETERIZATION N/A 11/24/2015   Procedure: Right/Left Heart Cath and Coronary Angiography;  Surgeon: Jolaine Artist, MD;  Location: [place] [facility];  Service: Cardiovascular;  Laterality: N/A;  . CHOLECYSTECTOMY  ~ 1965  . ESOPHAGOGASTRODUODENOSCOPY N/A 04/11/2013   Procedure: ESOPHAGOGASTRODUODENOSCOPY (EGD);  Surgeon: Missy Sabins, MD;  Location: [facility] ENDOSCOPY;  Service: Endoscopy;  Laterality: N/A;  . ESOPHAGOGASTRODUODENOSCOPY N/A 05/13/2014   Procedure: ESOPHAGOGASTRODUODENOSCOPY (EGD);  Surgeon: Arta Silence, MD;  Location: [facility] ENDOSCOPY;  Service: Endoscopy;  Laterality: N/A;  . TONSILLECTOMY  1940's  . US ECHOCARDIOGRAPHY  04/09/2010   EF 60-65%   HPI:  [name] is an 78 y.o. male with past medical history of diastolic heart failure, history of GI bleed, dementia, anemia, PE on Coumadin found to be lethargic 4/27, brought to ED from [facility].  Code stroke was called on admission, TPA was not given; he was found to be febrile ,hypotensive, tachycardic, and hypoxic in the ED.  CT of the head negative for stroke, CT Angie of the head and neck showed no significant stenosis. Patient was found to have COVID-19 positive PCR. Now with generalized decline; minimal PO intake.     Assessment / Plan / Recommendation Clinical Impression  Pt presents with s/s of a cognitive-based dysphagia, exacerbated by intermittent SOB and elevated RR, which interferes with coordination of breathing/swallowing.  Behavior is marked by inconsistent anticipation of POs arriving at mouth; oral holding with  intermittent cues needed to swallow (with both purees and thin liquids). Initial swallows of thin liquids led to coughing; this ceased as session progressed.  RR/WOB increased intake increased.  For now, recommend downgrading diet to Dysphagia 1, thin liquids.  Pt needs 1:1 assist to self-feed due to cognitive deficits at this time.    SLP Visit Diagnosis: Dysphagia, unspecified (R13.10)    Aspiration Risk  Mild aspiration risk    Diet Recommendation   Dysphagia 1, thin liquids  Medication Administration: Whole meds with puree    Other  Recommendations Oral Care Recommendations: Oral care BID   Follow up Recommendations Skilled Nursing facility      Frequency and Duration min 2x/week  2 weeks       Prognosis Barriers to Reach Goals: Cognitive deficits      Swallow Study   General Date of Onset: 04/30/2018 HPI: [name] is an 78 y.o. male with past medical history of diastolic heart failure, history of GI bleed, dementia, anemia, PE on Coumadin found to be lethargic 4/27, brought to ED from [facility].  Code stroke was called on admission, TPA was not given; he was found to be febrile ,hypotensive, tachycardic, and hypoxic in the ED. CT of the head negative for stroke, CT Angie of the head and neck showed no significant stenosis. Patient was found to have COVID-19 positive PCR. Now with generalized decline; minimal PO intake.   Type of Study: Bedside Swallow Evaluation Previous Swallow Assessment: no Diet Prior to this Study: Dysphagia 3 (soft);Thin liquids Temperature Spikes Noted: No Respiratory Status: Nasal cannula History of Recent Intubation: No Behavior/Cognition: Alert;Confused;Distractible;Requires cueing Oral Cavity Assessment: Dried secretions Oral Care Completed by SLP: Yes Oral Cavity - Dentition: Missing dentition Self-Feeding Abilities: Needs assist Patient Positioning: Upright in bed Baseline Vocal Quality: Normal Volitional Cough: Cognitively unable to  elicit Volitional Swallow: Unable to elicit    Oral/Motor/Sensory Function Overall Oral Motor/Sensory Function: Generalized oral weakness   Ice Chips Ice chips: Within functional limits   Thin Liquid Thin Liquid: Impaired Presentation: Straw;Spoon Oral Phase Functional Implications: Oral holding Pharyngeal  Phase Impairments: Cough - Delayed    Nectar Thick Nectar Thick Liquid: Not tested   Honey Thick Honey Thick Liquid: Not tested   Puree Puree: Impaired Presentation: Spoon Oral Phase Functional Implications: Oral holding Pharyngeal Phase Impairments: Suspected delayed Swallow   Solid     Solid: Not tested      [name] 05/29/2018,5:01 PM   Estill Bamberg L. Tivis Ringer, [place] Office number [phone] [place]: [phone]

## 2018-05-29 NOTE — Progress Notes (Signed)
ANTICOAGULATION CONSULT NOTE - Follow Up Consult  Pharmacy Consult for Warfarin Indication: Hx PE  Labs: Recent Labs    05/27/18 0500 05/28/18 0455 05/28/18 0748 05/29/18 0501 05/29/18 0732  HGB 15.1  --  14.6  --  14.6  HCT 47.8  --  46.0  --  45.4  PLT 239  --  213  --  213  LABPROT 37.1* 39.8*  --  38.6*  --   INR 3.8* 4.2*  --  4.0*  --   CREATININE 1.14 1.03  --   --  1.22    Estimated Creatinine Clearance: 52.4 mL/min (by C-G formula based on SCr of 1.22 mg/dL).   Medications:  Scheduled:  . amLODipine  5 mg Oral Daily  . carvedilol  6.25 mg Oral BID WC  . cholecalciferol  1,000 Units Oral Daily  . divalproex  125 mg Oral TID  . hydrocortisone sod succinate (SOLU-CORTEF) inj  100 mg Intravenous Q8H  . psyllium  1 packet Oral Daily  . vitamin C  500 mg Oral Daily  . Warfarin - Pharmacist Dosing Inpatient   Does not apply q1800  . zinc sulfate  220 mg Oral Daily    Assessment: 56 yom on warfarin PTA for PE/DVT history presenting with AMS, sepsis due to COVID positive. CVA work-up initiated. Noted remote hx of GIB, but no active bleed issues documented.  PTA warfarin dose: 5mg  daily except 2.5mg  on Tues/Fri (last dose 4/26 PTA).  Admission INR 2.6.  Today, 05/29/2018: INR 4, remains supratherapeutic but slightly decreased after holding x3 days.  Elevation likely d/t poor PO intake, antibiotics. CBC remains stable/WNL No bleeding or complications reported. No major drug-drug interactions noted.   Goal of Therapy:  INR 2-3 Monitor platelets by anticoagulation protocol: Yes   Plan:  Hold warfarin today. Daily PT/INR. Monitor for signs and symptoms of bleeding.   Gretta Arab PharmD, BCPS 05/29/2018 12:00 PM

## 2018-05-30 ENCOUNTER — Inpatient Hospital Stay (HOSPITAL_COMMUNITY): Payer: Medicare HMO

## 2018-05-30 LAB — CBC WITH DIFFERENTIAL/PLATELET
Abs Immature Granulocytes: 0.08 10*3/uL — ABNORMAL HIGH (ref 0.00–0.07)
Basophils Absolute: 0 10*3/uL (ref 0.0–0.1)
Basophils Relative: 0 %
Eosinophils Absolute: 0 10*3/uL (ref 0.0–0.5)
Eosinophils Relative: 0 %
HCT: 42.9 % (ref 39.0–52.0)
Hemoglobin: 13.5 g/dL (ref 13.0–17.0)
Immature Granulocytes: 1 %
Lymphocytes Relative: 13 %
Lymphs Abs: 1.3 10*3/uL (ref 0.7–4.0)
MCH: 28.2 pg (ref 26.0–34.0)
MCHC: 31.5 g/dL (ref 30.0–36.0)
MCV: 89.6 fL (ref 80.0–100.0)
Monocytes Absolute: 0.6 10*3/uL (ref 0.1–1.0)
Monocytes Relative: 6 %
Neutro Abs: 8.1 10*3/uL — ABNORMAL HIGH (ref 1.7–7.7)
Neutrophils Relative %: 80 %
Platelets: 242 10*3/uL (ref 150–400)
RBC: 4.79 MIL/uL (ref 4.22–5.81)
RDW: 16.5 % — ABNORMAL HIGH (ref 11.5–15.5)
WBC: 10.1 10*3/uL (ref 4.0–10.5)
nRBC: 0 % (ref 0.0–0.2)

## 2018-05-30 LAB — COMPREHENSIVE METABOLIC PANEL
ALT: 22 U/L (ref 0–44)
AST: 28 U/L (ref 15–41)
Albumin: 2.3 g/dL — ABNORMAL LOW (ref 3.5–5.0)
Alkaline Phosphatase: 51 U/L (ref 38–126)
Anion gap: 10 (ref 5–15)
BUN: 24 mg/dL — ABNORMAL HIGH (ref 8–23)
CO2: 18 mmol/L — ABNORMAL LOW (ref 22–32)
Calcium: 7.9 mg/dL — ABNORMAL LOW (ref 8.9–10.3)
Chloride: 114 mmol/L — ABNORMAL HIGH (ref 98–111)
Creatinine, Ser: 1.07 mg/dL (ref 0.61–1.24)
GFR calc Af Amer: >60 mL/min (ref >60)
GFR calc non Af Amer: >60 mL/min (ref >60)
Glucose, Bld: 127 mg/dL — ABNORMAL HIGH (ref 70–99)
Potassium: 3.5 mmol/L (ref 3.5–5.1)
Sodium: 142 mmol/L (ref 135–145)
Total Bilirubin: 0.5 mg/dL (ref 0.3–1.2)
Total Protein: 6 g/dL — ABNORMAL LOW (ref 6.5–8.1)

## 2018-05-30 LAB — BRAIN NATRIURETIC PEPTIDE: B Natriuretic Peptide: 30.6 pg/mL (ref 0.0–100.0)

## 2018-05-30 LAB — FERRITIN: Ferritin: 324 ng/mL (ref 24–336)

## 2018-05-30 LAB — PROTIME-INR
INR: 4 — ABNORMAL HIGH (ref 0.8–1.2)
Prothrombin Time: 38.1 seconds — ABNORMAL HIGH (ref 11.4–15.2)

## 2018-05-30 LAB — D-DIMER, QUANTITATIVE: D-Dimer, Quant: 0.66 ug/mL-FEU — ABNORMAL HIGH (ref 0.00–0.50)

## 2018-05-30 LAB — C-REACTIVE PROTEIN: CRP: 7.2 mg/dL — ABNORMAL HIGH (ref <1.0)

## 2018-05-30 LAB — MAGNESIUM: Magnesium: 2 mg/dL (ref 1.7–2.4)

## 2018-05-30 MED ORDER — ACETYLCYSTEINE 10 % IN SOLN
4.0000 mL | Freq: Once | RESPIRATORY_TRACT | Status: DC
  Filled 2018-05-30: qty 4

## 2018-05-30 MED ORDER — KCL IN DEXTROSE-NACL 10-5-0.45 MEQ/L-%-% IV SOLN
INTRAVENOUS | Status: AC
  Administered 2018-05-30: 14:00:00 via INTRAVENOUS
  Filled 2018-05-30 (×2): qty 1000

## 2018-05-30 MED ORDER — ENSURE ENLIVE PO LIQD
237.0000 mL | Freq: Three times a day (TID) | ORAL | Status: DC
  Administered 2018-05-30 – 2018-06-03 (×10): 237 mL via ORAL

## 2018-05-30 MED ORDER — ADULT MULTIVITAMIN W/MINERALS CH
1.0000 | ORAL_TABLET | Freq: Every day | ORAL | Status: DC
  Administered 2018-05-30 – 2018-06-05 (×6): 1 via ORAL
  Filled 2018-05-30 (×6): qty 1

## 2018-05-30 MED ORDER — POTASSIUM CHLORIDE 10 MEQ/100ML IV SOLN
10.0000 meq | INTRAVENOUS | Status: AC
  Administered 2018-05-30 (×4): 10 meq via INTRAVENOUS
  Filled 2018-05-30 (×3): qty 100

## 2018-05-30 NOTE — Progress Notes (Signed)
CSW confirmed with [facility] that patient can return when medically ready, without needing negative COVID screens.   CSW will continue to follow for dc back to [facility] when patient medically ready.   [place], [place]

## 2018-05-30 NOTE — Progress Notes (Signed)
On rounding patient found to have increased work of breathing and use of accessory muscles. O2 sats are 94-97% on 2L [place], RR 25-29/min, and HR 80's. Patient is afebrile, temp 98.6.   Upper Lung sounds rhonchi throughout, lower diminished. Patient attempts to cough but cannot quite clear secretions. Patient suctioned, oral care performed. MD Candiss Norse made aware. New orders received, will continue to monitor closely and will not feed patient if aspiration suspected.

## 2018-05-30 NOTE — Progress Notes (Signed)
[facility] PROGRESS NOTE    Progress Note  [name]  LTJ:[id number] DOB: 06/17/1940 DOA: 05/17/2018 PCP: Dennis Cruel, MD     Brief Narrative:   [name] is an 78 y.o. male past medical history of diastolic heart failure, history of GI bleed, dementia anemia of chronic disease PE on Coumadin found to be lethargic facility on the day of admission.  Code stroke was called admission, TPA was not given he was found to be febrile hypotensive tachycardic and hypoxic in the ED CT of the head negative for stroke, CT Angie of the head and neck showed no significant stenosis. Patient was found to have COVID-19 positive PCR. Comes from home.  Subjective:   Shin in bed, appears weak, denies any headache chest or abdominal pain.  Assessment/Plan:   Sirs secondary to COVID-19: Acute respiratory failure with hypoxia due to COVID-19 virus infection -   His respiratory failure has improved he is currently between 2 to 3 L nasal cannula oxygen, his main issue now seems to be generalized global decline, he has been eating and drinking minimally, at baseline is bedbound with multiple bedsores, moderately demented but did have good conversations with wife on a regular basis. Bacterial infection ruled out all antibiotics were stopped on 05/28/2018.  He was quite somnolent and minimally responsive on 05/28/2018, likely due to underlying Addison's disease on top of his acute COVID infection, he was placed on stress dose IV steroids on 05/28/2018 with improvement.  Diet changed to soft, speech therapy to evaluate.  Continues to be quite sick with guarded prognosis but overall clinically showing gradual improvement since the stress dose steroids were added, speech on board, increasing diet and activity.  Will require SNF placement.  Will repeat COVID test on 05/30/2018.  COVID-19 Labs  Recent Labs    05/28/18 0455  05/28/18 1400 05/29/18 0731 05/29/18 0732 05/30/18 0500  DDIMER 1.25*   --   --   --  0.94* 0.66*  FERRITIN  --    < > 85 309  --  324  CRP  --   --  18.3* 16.7*  --  7.2*   < > = values in this interval not displayed.    Lab Results  Component Value Date   SARSCOV2NAA POSITIVE (A) 07/62/2633    Acute metabolic encephalopathy with right-sided weakness along with mild dysphagia: MRI brain was negative recently, this appears to be nonspecific encephalopathy as explained in #1 above.  See changes above.  Minimize narcotics and benzodiazepines.  For his stroke he is on Coumadin pharmacy monitoring INR.  Adrenal insufficiency/Addison's disease: Switched to stress dose IV steroids on 05/28/2018 will monitor.  Dysphagia.  Likely due to generalized weakness, MRI was recently negative, speech following.  Currently on dysphagia 1 diet.    Chronic diastolic heart failure: Has been diuresed earlier, now on Coreg we will continue to monitor.  Hypokalemia : Replaced.  Diabetes mellitus type 2: A1c was 5.8. diet controlled.  History of PE on Coumadin: Continue current medication. Is currently therapeutic.  Hypothyroidism: Continue Synthroid.  History of dementia without behavioral disturbances: No events overnight continue to oral Depakote.  New onset Diarrhea: Diarrhea has resolved will discontinue his Flexi-Seal.  Non-anion gap metabolic acidosis: Due to diarrhea improving.  Supportive care.   Chronic kidney disease stage III: at baseline.   Present on admission various pressure ulcer anywhere from stage II to stage III: Turn patient every 2 hours.  RN Pressure Injury Documentation: Pressure  Injury 12/31/17 Unstageable - Full thickness tissue loss in which the base of the ulcer is covered by slough (yellow, tan, gray, green or brown) and/or eschar (tan, brown or black) in the wound bed. 2 areas of unstageable pressure injuries (Active)  12/31/17 1451  Location: Hip  Location Orientation: Right  Staging: Unstageable - Full thickness tissue loss in which the  base of the ulcer is covered by slough (yellow, tan, gray, green or brown) and/or eschar (tan, brown or black) in the wound bed.  Wound Description (Comments): 2 areas of unstageable pressure injuries  Present on Admission: Yes     Pressure Injury 12/31/17 Stage III -  Full thickness tissue loss. Subcutaneous fat may be visible but bone, tendon or muscle are NOT exposed. this is a full thickness wound, NOT a pressure injury (Active)  12/31/17 1452  Location: Foot  Location Orientation: Left  Staging: Stage III -  Full thickness tissue loss. Subcutaneous fat may be visible but bone, tendon or muscle are NOT exposed.  Wound Description (Comments): this is a full thickness wound, NOT a pressure injury  Present on Admission: Yes     Pressure Injury 12/31/17 Stage II -  Partial thickness loss of dermis presenting as a shallow open ulcer with a red, pink wound bed without slough. these are partial thickness wounds, NOT pressure injuries (Active)  12/31/17 1453  Location: Back  Location Orientation: Right  Staging: Stage II -  Partial thickness loss of dermis presenting as a shallow open ulcer with a red, pink wound bed without slough.  Wound Description (Comments): these are partial thickness wounds, NOT pressure injuries  Present on Admission: Yes    Estimated body mass index is 26.95 kg/m as calculated from the following:   Height as of this encounter: 5\' 10"  (1.778 m).   Weight as of this encounter: 85.2 kg.   DVT prophylaxis: lovenox Family Communication: I have spoken to his wife over the phone on 05/28/18, 05/29/18 Disposition Plan/Barrier to D/C: SNF  Code Status:  DNR   IV Access:    Peripheral IV   Procedures and diagnostic studies:   Dg Chest Port 1 View  Result Date: 05/30/2018 CLINICAL DATA:  Shortness of breath. EXAM: PORTABLE CHEST 1 VIEW COMPARISON:  Chest x-ray dated May 25, 2018. FINDINGS: The heart size and mediastinal contours are within normal limits.  Atherosclerotic calcification of the aortic arch. Normal pulmonary vascularity. Low lung volumes with mild bibasilar atelectasis. Additional linear atelectasis in the left mid lung. No focal consolidation, pleural effusion, or pneumothorax. No acute osseous abnormality. IMPRESSION: Low lung volumes.  No active disease. Electronically Signed   By: Titus Dubin M.D.   On: 05/30/2018 08:11     Medical Consultants:    None.  Anti-Infectives:   Antibiotics Given (last 72 hours)    Date/Time Action Medication Dose Rate   05/27/18 1318 New Bag/Given   vancomycin (VANCOCIN) 1,750 mg in sodium chloride 0.9 % 500 mL IVPB 1,750 mg 250 mL/hr   05/27/18 1631 New Bag/Given   ceFEPIme (MAXIPIME) 2 g in sodium chloride 0.9 % 100 mL IVPB 2 g 200 mL/hr   05/28/18 0302 New Bag/Given   ceFEPIme (MAXIPIME) 2 g in sodium chloride 0.9 % 100 mL IVPB 2 g 200 mL/hr      Objective:    Vitals:   05/30/18 0332 05/30/18 0400 05/30/18 0800 05/30/18 0814  BP: (!) 151/86 (!) 141/84 124/76   Pulse: 88 89 82   Resp: 20  (!)  25   Temp: 99.1 F (37.3 C)   97.6 F (36.4 C)  TempSrc: Oral   Oral  SpO2: 94% 93% 97%   Weight:      Height:        Intake/Output Summary (Last 24 hours) at 05/30/2018 1046 Last data filed at 05/30/2018 0100 Gross per 24 hour  Intake 620 ml  Output 1200 ml  Net -580 ml   Filed Weights   05/28/18 0704 05/29/18 0500 05/30/18 0104  Weight: 83.4 kg 83.9 kg 85.2 kg    Exam:  Patient in bed overall frail but more responsive, answered basic questions and follow basic commands, has a condom catheter [place].AT,PERRAL Supple Neck,No JVD, No cervical lymphadenopathy appriciated.  Symmetrical Chest wall movement, Good air movement bilaterally, CTAB RRR,No Gallops, Rubs or new Murmurs, No Parasternal Heave +ve B.Sounds, Abd Soft, No tenderness, No organomegaly appriciated, No rebound - guarding or rigidity. No Cyanosis, Clubbing or edema, No new Rash or bruise    Data Reviewed:     Labs: Basic Metabolic Panel: Recent Labs  Lab 05/24/18 0730  05/26/18 0600 05/27/18 0500 05/28/18 0455 05/29/18 0732 05/30/18 0500  NA 137   < > 140 139 141 140 142  K 3.2*   < > 3.0* 2.9* 3.7 3.2* 3.5  CL 109   < > 110 109 115* 115* 114*  CO2 19*   < > 20* 18* 14* 17* 18*  GLUCOSE 79   < > 91 90 58* 127* 127*  BUN 19   < > 18 17 17 22  24*  CREATININE 1.02   < > 1.05 1.14 1.03 1.22 1.07  CALCIUM 7.8*   < > 7.8* 7.9* 7.4* 7.8* 7.9*  MG 1.7   < > 1.9 1.8 2.0 2.1 2.0  PHOS 2.6  --   --   --   --  2.7  --    < > = values in this interval not displayed.   GFR Estimated Creatinine Clearance: 59.7 mL/min (by C-G formula based on SCr of 1.07 mg/dL). Liver Function Tests: Recent Labs  Lab 05/25/18 0520 05/26/18 0600 05/27/18 0500 05/28/18 0455 05/30/18 0500  AST 28 28 28 29 28   ALT 26 25 23 17 22   ALKPHOS 65 62 65 49 51  BILITOT 0.8 0.8 1.0 0.5 0.5  PROT 6.1* 6.3* 6.3* 5.0* 6.0*  ALBUMIN 2.7* 2.7* 2.6* 2.1* 2.3*   No results for input(s): LIPASE, AMYLASE in the last 168 hours. No results for input(s): AMMONIA in the last 168 hours. Coagulation profile Recent Labs  Lab 05/26/18 0600 05/27/18 0500 05/28/18 0455 05/29/18 0501 05/30/18 0500  INR 3.4* 3.8* 4.2* 4.0* 4.0*    CBC: Recent Labs  Lab 05/25/18 0520 05/26/18 0600 05/27/18 0500 05/28/18 0748 05/29/18 0732 05/30/18 0500  WBC 11.2* 9.3 10.3 9.4 8.2 10.1  NEUTROABS 8.0* 6.6 7.6 6.7  --  8.1*  HGB 13.9 14.3 15.1 14.6 14.6 13.5  HCT 43.3 43.1 47.8 46.0 45.4 42.9  MCV 88.5 88.5 90.4 89.5 89.7 89.6  PLT 241 222 239 213 213 242   Cardiac Enzymes: No results for input(s): CKTOTAL, CKMB, CKMBINDEX, TROPONINI in the last 168 hours. BNP (last 3 results) No results for input(s): PROBNP in the last 8760 hours. CBG: Recent Labs  Lab 05/23/18 1202 05/23/18 1706 05/23/18 2107 05/24/18 0035 05/24/18 0342  GLUCAP 79 87 143* 83 95   D-Dimer: Recent Labs    05/29/18 0732 05/30/18 0500  DDIMER 0.94* 0.66*    Hgb  A1c: No results for input(s): HGBA1C in the last 72 hours. Lipid Profile: No results for input(s): CHOL, HDL, LDLCALC, TRIG, CHOLHDL, LDLDIRECT in the last 72 hours. Thyroid function studies: No results for input(s): TSH, T4TOTAL, T3FREE, THYROIDAB in the last 72 hours.  Invalid input(s): FREET3 Anemia work up: Recent Labs    05/29/18 0731 05/30/18 0500  FERRITIN 309 324   Sepsis Labs: Recent Labs  Lab 05/24/18 0730 05/25/18 0520 05/26/18 0600 05/27/18 0500 05/28/18 0748 05/29/18 0732 05/30/18 0500  PROCALCITON 0.36 0.92 0.45  --  0.26  --   --   WBC 11.0* 11.2* 9.3 10.3 9.4 8.2 10.1   Microbiology Recent Results (from the past 240 hour(s))  SARS Coronavirus 2 [facility] order, Performed in [place] hospital lab)     Status: Abnormal   Collection Time: 05/15/2018 12:10 PM  Result Value Ref Range Status   SARS Coronavirus 2 POSITIVE (A) NEGATIVE Final    Comment: RESULT CALLED TO, READ BACK BY AND VERIFIED WITH: RN Lowell Bouton [phone] (NOTE) If result is NEGATIVE SARS-CoV-2 target nucleic acids are NOT DETECTED. The SARS-CoV-2 RNA is generally detectable in upper and lower  respiratory specimens during the acute phase of infection. The lowest  concentration of SARS-CoV-2 viral copies this assay can detect is 250  copies / mL. A negative result does not preclude SARS-CoV-2 infection  and should not be used as the sole basis for treatment or other  patient management decisions.  A negative result may occur with  improper specimen collection / handling, submission of specimen other  than nasopharyngeal swab, presence of viral mutation(s) within the  areas targeted by this assay, and inadequate number of viral copies  (<250 copies / mL). A negative result must be combined with clinical  observations, patient history, and epidemiological information. If result is POSITIVE SARS-CoV-2 target nucleic acids are DETECTED. The SARS-Co V-2 RNA is generally detectable  in upper and lower  respiratory specimens during the acute phase of infection.  Positive  results are indicative of active infection with SARS-CoV-2.  Clinical  correlation with patient history and other diagnostic information is  necessary to determine patient infection status.  Positive results do  not rule out bacterial infection or co-infection with other viruses. If result is PRESUMPTIVE POSTIVE SARS-CoV-2 nucleic acids MAY BE PRESENT.   A presumptive positive result was obtained on the submitted specimen  and confirmed on repeat testing.  While 2019 novel coronavirus  (SARS-CoV-2) nucleic acids may be present in the submitted sample  additional confirmatory testing may be necessary for epidemiological  and / or clinical management purposes  to differentiate between  SARS-CoV-2 and other Sarbecovirus currently known to infect humans.  If clinically indicated additional testing with an alternate test  methodology [phone]) is advised.  The SARS-CoV-2 RNA is generally  detectable in upper and lower respiratory specimens during the acute  phase of infection. The expected result is Negative. Fact Sheet for Patients:  StrictlyIdeas.no Fact Sheet for Healthcare Providers: BankingDealers.co.za This test is not yet approved or cleared by the [place] FDA and has been authorized for detection and/or diagnosis of SARS-CoV-2 by FDA under an Emergency Use Authorization (EUA).  This EUA will remain in effect (meaning this test can be used) for the duration of the COVID-19 declaration under Section 564(b)(1) of the Act, 21 U.S.C. section 360bbb-3(b)(1), unless the authorization is terminated or revoked sooner. Performed at [facility], [place] [address], [place], [place] [address]  Blood Culture (routine x 2)     Status: None   Collection Time: 05/13/2018 12:16 PM  Result Value Ref Range Status   Specimen Description BLOOD RIGHT  ANTECUBITAL  Final   Special Requests   Final    BOTTLES DRAWN AEROBIC AND ANAEROBIC Blood Culture adequate volume   Culture   Final    NO GROWTH 5 DAYS Performed at [facility], [address], [place], [place] [address]    Report Status 05/27/2018 FINAL  Final  Blood Culture (routine x 2)     Status: None   Collection Time: 04/26/2018  6:42 PM  Result Value Ref Range Status   Specimen Description BLOOD LEFT ANTECUBITAL  Final   Special Requests   Final    BOTTLES DRAWN AEROBIC ONLY Blood Culture results may not be optimal due to an inadequate volume of blood received in culture bottles   Culture   Final    NO GROWTH 5 DAYS Performed at [facility], [place] [address], [place], [place] [address]    Report Status 05/27/2018 FINAL  Final  MRSA PCR Screening     Status: None   Collection Time: 05/23/18 10:45 AM  Result Value Ref Range Status   MRSA by PCR NEGATIVE NEGATIVE Final    Comment:        The GeneXpert MRSA Assay (FDA approved for NASAL specimens only), is one component of a comprehensive MRSA colonization surveillance program. It is not intended to diagnose MRSA infection nor to guide or monitor treatment for MRSA infections. Performed at [facility], [place] [address], [place], [place] [address]   C Difficile Quick Screen w PCR reflex     Status: None   Collection Time: 05/24/18  4:52 PM  Result Value Ref Range Status   C Diff antigen NEGATIVE NEGATIVE Final   C Diff toxin NEGATIVE NEGATIVE Final   C Diff interpretation No C. difficile detected.  Final    Comment: Performed at [facility], [place] [address], [place], [place] [address]     Medications:   . amLODipine  5 mg Oral Daily  . carvedilol  6.25 mg Oral BID WC  . cholecalciferol  1,000 Units Oral Daily  . divalproex  125 mg Oral TID  . feeding supplement (ENSURE ENLIVE)  237 mL Oral TID BM  . hydrocortisone sod succinate (SOLU-CORTEF) inj  100 mg Intravenous Q8H  .  multivitamin with minerals  1 tablet Oral Daily  . psyllium  1 packet Oral Daily  . Warfarin - Pharmacist Dosing Inpatient   Does not apply q1800   Continuous Infusions: . sodium chloride 250 mL (05/27/18 1316)  . potassium chloride 10 mEq (05/30/18 0925)      LOS: 8 days   [facility]  05/30/2018, 10:46 AM

## 2018-05-30 NOTE — Progress Notes (Signed)
ANTICOAGULATION CONSULT NOTE - Follow Up Consult  Pharmacy Consult for Warfarin Indication: Hx PE  Labs: Recent Labs    05/28/18 0455  05/28/18 0748 05/29/18 0501 05/29/18 0732 05/30/18 0500  HGB  --    < > 14.6  --  14.6 13.5  HCT  --   --  46.0  --  45.4 42.9  PLT  --   --  213  --  213 242  LABPROT 39.8*  --   --  38.6*  --  38.1*  INR 4.2*  --   --  4.0*  --  4.0*  CREATININE 1.03  --   --   --  1.22 1.07   < > = values in this interval not displayed.    Estimated Creatinine Clearance: 59.7 mL/min (by C-G formula based on SCr of 1.07 mg/dL).   Medications:  Scheduled:  . amLODipine  5 mg Oral Daily  . carvedilol  6.25 mg Oral BID WC  . cholecalciferol  1,000 Units Oral Daily  . divalproex  125 mg Oral TID  . feeding supplement (ENSURE ENLIVE)  237 mL Oral TID BM  . hydrocortisone sod succinate (SOLU-CORTEF) inj  100 mg Intravenous Q8H  . multivitamin with minerals  1 tablet Oral Daily  . psyllium  1 packet Oral Daily  . Warfarin - Pharmacist Dosing Inpatient   Does not apply q1800    Assessment: 34 yom on warfarin PTA for PE/DVT history presenting with AMS, sepsis due to COVID positive. CVA work-up initiated. Noted remote hx of GIB, but no active bleed issues documented.  PTA warfarin dose: 5mg  daily except 2.5mg  on Tues/Fri (last dose 4/26 PTA).  Admission INR 2.6.  Today, 05/30/2018:  INR 4, remains supratherapeutic despite holding x4 days.  Elevation likely d/t poor PO intake, antibiotics.  CBC remains stable/WNL  No bleeding or complications reported.  No major drug-drug interactions noted.   Goal of Therapy:  INR 2-3 Monitor platelets by anticoagulation protocol: Yes   Plan:  Hold warfarin today. Daily PT/INR. Monitor for signs and symptoms of bleeding.   Gretta Arab PharmD, BCPS 05/30/2018 1:20 PM

## 2018-05-30 NOTE — Progress Notes (Signed)
Initial Nutrition Assessment   RD working remotely.   DOCUMENTATION CODES:   Not applicable  INTERVENTION:   Ensure Enlive po TID between meals, each supplement provides 350 kcal and 20 grams of protein  Add MVI with Minerals daily  Pt receiving Magic cup BID with lunch and dinner, each supplement provides 290 kcal and 9 grams of protein and Hormel Shake at Breakfast daily  Feeding assistance with meals  Recommend checking Vitamin C and Zinc levels and supplementing if needed  NUTRITION DIAGNOSIS:   Inadequate oral intake related to acute illness, poor appetite as evidenced by meal completion < 50%.  GOAL:   Patient will meet greater than or equal to 90% of their needs  MONITOR:   PO intake, Supplement acceptance, Labs, Weight trends, Skin  REASON FOR ASSESSMENT:   Malnutrition Screening Tool    ASSESSMENT:   78 yo male admitted with sepsis secondary to COVID-19 with acute on chronic respiratory failure. AMS with right sided weakness.  PMH includes CHF, DM, dementia, CKD III, Addison's disease. Pt is bed bound at baseline with multiple pressure injuries.   5/03 somnolent, minimally responsive 5/04 SLP evaluated, pt pocketing food, diet downgraded to Dysphagia I, thins  Diet downgraded to Dysphagia I with Thins. Recorded po intake 0-50% of meals.   Admission weight 73.9 kg; current wt 85.2 kg. Pt with edema on exam. No recent wt loss but appears that weight down over the past year per weight encounters  Diarrhea has resolved  Labs: Creatinine wdl, BUN 24, potassium wdl Meds: Vitamin D3, KCl, psyllium, solucortef  NUTRITION - FOCUSED PHYSICAL EXAM:  Unable to assess  Diet Order:   Diet Order            DIET - DYS 1 Room service appropriate? Yes; Fluid consistency: Thin  Diet effective now              EDUCATION NEEDS:   Not appropriate for education at this time  Skin:  Skin Assessment: Skin Integrity Issues: Skin Integrity Issues:: Other  (Comment) Other: MASD: sacrum, scrotum; old wound on leg  Last BM:  5/5  Height:   Ht Readings from Last 1 Encounters:  05/19/2018 5\' 10"  (1.778 m)    Weight:   Wt Readings from Last 1 Encounters:  05/30/18 85.2 kg    Ideal Body Weight:  75.5 kg  BMI:  Body mass index is 26.95 kg/m.  Estimated Nutritional Needs:   Kcal:  1900-2100 kcals   Protein:  95-110 g   Fluid:  >/= 1.9 L    Kerman Passey MS, RD, LDN, CNSC [phone] Pager  [phone] Weekend/On-Call Pager

## 2018-05-30 NOTE — Progress Notes (Signed)
RT NT Suctioned pt x 1 each nare but was only able to obtain small amount of white/tan thick sputum from his right nare. Pt stable throughout with no complications. VS within normal limits. RN at bedside throughout.

## 2018-05-31 ENCOUNTER — Inpatient Hospital Stay (HOSPITAL_COMMUNITY): Payer: Medicare HMO

## 2018-05-31 LAB — C-REACTIVE PROTEIN: CRP: 6.7 mg/dL — ABNORMAL HIGH (ref <1.0)

## 2018-05-31 LAB — COMPREHENSIVE METABOLIC PANEL
ALT: 23 U/L (ref 0–44)
AST: 25 U/L (ref 15–41)
Albumin: 2.4 g/dL — ABNORMAL LOW (ref 3.5–5.0)
Alkaline Phosphatase: 51 U/L (ref 38–126)
Anion gap: 8 (ref 5–15)
BUN: 21 mg/dL (ref 8–23)
CO2: 18 mmol/L — ABNORMAL LOW (ref 22–32)
Calcium: 7.7 mg/dL — ABNORMAL LOW (ref 8.9–10.3)
Chloride: 114 mmol/L — ABNORMAL HIGH (ref 98–111)
Creatinine, Ser: 1.08 mg/dL (ref 0.61–1.24)
GFR calc Af Amer: >60 mL/min (ref >60)
GFR calc non Af Amer: >60 mL/min (ref >60)
Glucose, Bld: 132 mg/dL — ABNORMAL HIGH (ref 70–99)
Potassium: 3.4 mmol/L — ABNORMAL LOW (ref 3.5–5.1)
Sodium: 140 mmol/L (ref 135–145)
Total Bilirubin: 0.7 mg/dL (ref 0.3–1.2)
Total Protein: 5.9 g/dL — ABNORMAL LOW (ref 6.5–8.1)

## 2018-05-31 LAB — D-DIMER, QUANTITATIVE (NOT AT ARMC): D-Dimer, Quant: 0.58 ug/mL-FEU — ABNORMAL HIGH (ref 0.00–0.50)

## 2018-05-31 LAB — CBC WITH DIFFERENTIAL/PLATELET
Abs Immature Granulocytes: 0.07 10*3/uL (ref 0.00–0.07)
Basophils Absolute: 0 10*3/uL (ref 0.0–0.1)
Basophils Relative: 0 %
Eosinophils Absolute: 0 10*3/uL (ref 0.0–0.5)
Eosinophils Relative: 0 %
HCT: 40.2 % (ref 39.0–52.0)
Hemoglobin: 13 g/dL (ref 13.0–17.0)
Immature Granulocytes: 1 %
Lymphocytes Relative: 10 %
Lymphs Abs: 1 10*3/uL (ref 0.7–4.0)
MCH: 29 pg (ref 26.0–34.0)
MCHC: 32.3 g/dL (ref 30.0–36.0)
MCV: 89.5 fL (ref 80.0–100.0)
Monocytes Absolute: 0.6 10*3/uL (ref 0.1–1.0)
Monocytes Relative: 6 %
Neutro Abs: 7.9 10*3/uL — ABNORMAL HIGH (ref 1.7–7.7)
Neutrophils Relative %: 83 %
Platelets: 222 10*3/uL (ref 150–400)
RBC: 4.49 MIL/uL (ref 4.22–5.81)
RDW: 16.6 % — ABNORMAL HIGH (ref 11.5–15.5)
WBC: 9.6 10*3/uL (ref 4.0–10.5)
nRBC: 0 % (ref 0.0–0.2)

## 2018-05-31 LAB — FERRITIN: Ferritin: 66 ng/mL (ref 24–336)

## 2018-05-31 LAB — PROTIME-INR
INR: 4.3 (ref 0.8–1.2)
Prothrombin Time: 40.5 seconds — ABNORMAL HIGH (ref 11.4–15.2)

## 2018-05-31 LAB — MRSA PCR SCREENING: MRSA by PCR: NEGATIVE

## 2018-05-31 LAB — MAGNESIUM: Magnesium: 2 mg/dL (ref 1.7–2.4)

## 2018-05-31 MED ORDER — LIP MEDEX EX OINT
TOPICAL_OINTMENT | CUTANEOUS | Status: DC | PRN
  Administered 2018-05-31: 18:00:00 via TOPICAL
  Filled 2018-05-31: qty 7

## 2018-05-31 MED ORDER — RESOURCE THICKENUP CLEAR PO POWD
ORAL | Status: DC | PRN
  Filled 2018-05-31: qty 125

## 2018-05-31 MED ORDER — SODIUM CHLORIDE 0.9 % IV SOLN
3.0000 g | Freq: Four times a day (QID) | INTRAVENOUS | Status: DC
  Administered 2018-05-31 – 2018-06-05 (×21): 3 g via INTRAVENOUS
  Filled 2018-05-31 (×22): qty 3

## 2018-05-31 NOTE — Progress Notes (Signed)
Occupational Therapy Treatment Patient Details Name: [name] MRN: [id number] DOB: 1940/07/22 Today's Date: 05/31/2018    History of present illness 78 yo male presenting from SNF with AMS. Tested COVID positive. PMH including CHF, h/o GI bleeding, Addison's disease, dementia, anemia chronic disease, and pulmonary embolus.    OT comments  Pt presents supine in bed agreeable to working with therapy. Pt continues to have decreased AROM and increased tone in bil UEs. Provided gentle stretching/PROM to bil UEs across all planes within pt tolerance and within pt end range. Pt tending to maintain digits in flexion, maintained verbal confirmation from MD and placed order for bil resting hand splints to wear during night time hours. Will follow up after delivery of splints to establish splint wear schedule. Completed simple grooming ADL (face washing) task given max hand over hand assist to perform. Pt responding intermittently with one word responses throughout session. HR intermittently up to 120s for brief periods during activity. Feel SNF recommendation remains appropriate at this time. Will continue to follow acutely.   Follow Up Recommendations  SNF;Supervision/Assistance - 24 hour    Equipment Recommendations  None recommended by OT          Precautions / Restrictions Precautions Precautions: Fall Precaution Comments: significant tone all 4 extremities Restrictions Weight Bearing Restrictions: No       Mobility Bed Mobility Overal bed mobility: Needs Assistance             General bed mobility comments: total assist to scoot to [facility] x 2 people  Transfers                      Balance                                           ADL either performed or assessed with clinical judgement   ADL Overall ADL's : Needs assistance/impaired     Grooming: Maximal assistance;Total assistance;Wash/dry face;Bed level Grooming Details (indicate cue  type and reason): max hand over hand assist to perform basic task; pt with minimal initiation to take over task                               General ADL Comments: overall continues to require totalA for ADL     Vision       Perception     Praxis      Cognition Arousal/Alertness: Awake/alert Behavior During Therapy: Flat affect Overall Cognitive Status: History of cognitive impairments - at baseline                               Problem Solving: Slow processing;Decreased initiation General Comments: providing one word responses intermittently, able to tell me he is in the hospital        Exercises Exercises: General Upper Extremity General Exercises - Upper Extremity Shoulder Flexion: PROM;Both;10 reps Shoulder ABduction: PROM;Both;10 reps Elbow Flexion: PROM;Both;10 reps Elbow Extension: PROM;Both;10 reps Wrist Flexion: PROM;Both;10 reps Wrist Extension: PROM;Both;10 reps Digit Composite Flexion: PROM;Both;10 reps Composite Extension: PROM;Both;10 reps General Exercises - Lower Extremity Ankle Circles/Pumps: PROM;Both;10 reps Heel Slides: PROM;Both;10 reps Hip ABduction/ADduction: PROM;Both;10 reps Other Exercises Other Exercises: Bed positioned in chair mode so that RN tech/SLP could assist with eating lunch.  Shoulder Instructions       General Comments HR intermittently up to 120s for brief periods of time    Pertinent Vitals/ Pain       Pain Assessment: Faces Faces Pain Scale: Hurts little more Pain Location: with ROM Pain Descriptors / Indicators: Grimacing Pain Intervention(s): Monitored during session;Limited activity within patient's tolerance  Home Living                                          Prior Functioning/Environment              Frequency  Min 2X/week        Progress Toward Goals  OT Goals(current goals can now be found in the care plan section)  Progress towards OT goals:  Progressing toward goals  Acute Rehab OT Goals Patient Stated Goal: Unstated by pt. wife wanting him safe and return to rehab OT Goal Formulation: With patient/family Time For Goal Achievement: 06/06/18 Potential to Achieve Goals: Good  Plan Discharge plan remains appropriate    Co-evaluation                 AM-PAC OT "6 Clicks" Daily Activity     Outcome Measure   Help from another person eating meals?: A Lot Help from another person taking care of personal grooming?: A Lot Help from another person toileting, which includes using toliet, bedpan, or urinal?: Total Help from another person bathing (including washing, rinsing, drying)?: A Lot Help from another person to put on and taking off regular upper body clothing?: A Lot Help from another person to put on and taking off regular lower body clothing?: Total 6 Click Score: 10    End of Session Equipment Utilized During Treatment: Oxygen  OT Visit Diagnosis: Unsteadiness on feet (R26.81);Other abnormalities of gait and mobility (R26.89);Muscle weakness (generalized) (M62.81);Other symptoms and signs involving cognitive function   Activity Tolerance Patient tolerated treatment well   Patient Left in bed;with call bell/phone within reach;with bed alarm set   Nurse Communication Mobility status        Time: 0092-3300 OT Time Calculation (min): 39 min  Charges: OT General Charges $OT Visit: 1 Visit OT Treatments $Self Care/Home Management : 8-22 mins $Therapeutic Activity: 8-22 mins  [name], OT Supplemental Rehabilitation Services Pager [phone] Office [phone]    [name] 05/31/2018, 4:55 PM

## 2018-05-31 NOTE — Progress Notes (Signed)
At 0805, Lala Lund, MD was paged regarding a critical lab value, INR of 4.3.

## 2018-05-31 NOTE — Progress Notes (Signed)
At 1315, the pt's Wife was called again and updated regarding the pt's current status. The Wife was able to speak to the pt, as well. The pt was only able to say one word.

## 2018-05-31 NOTE — Progress Notes (Signed)
  Speech Language Pathology Treatment: Dysphagia  Patient Details Name: [name] MRN: [id number] DOB: August 10, 1940 Today's Date: 05/31/2018 Time: 5397-6734 SLP Time Calculation (min) (ACUTE ONLY): 27 min  Assessment / Plan / Recommendation Clinical Impression  Pt tolerance of Po has been tenuous with coughing episodes observed by staff. He is easily short of breath, as seen today after working with PT, and mentation/attention waxes and wanes. Today pt demonstrated prolonged oral holding while rapidly breathing. No signs of aspiration occurred. Given that subtle changes in mental status and respiratory function impact this pts automaticity with PO. Recommend nectar thick liquids to allow for a slower, more cohesive bolus. Will f/u for tolerance and readiness for upgrade as function becomes more stable.   HPI HPI: [name] is an 78 y.o. male with past medical history of diastolic heart failure, history of GI bleed, dementia, anemia, PE on Coumadin found to be lethargic 4/27, brought to ED from [facility].  Code stroke was called on admission, TPA was not given; he was found to be febrile ,hypotensive, tachycardic, and hypoxic in the ED. CT of the head negative for stroke, CT Angie of the head and neck showed no significant stenosis. Patient was found to have COVID-19 positive PCR. Now with generalized decline; minimal PO intake.        SLP Plan  Continue with current plan of care       Recommendations  Diet recommendations: Dysphagia 1 (puree);Nectar-thick liquid Liquids provided via: Cup;Straw Medication Administration: Whole meds with puree Supervision: Full supervision/cueing for compensatory strategies Compensations: Minimize environmental distractions;Slow rate;Small sips/bites Postural Changes and/or Swallow Maneuvers: Seated upright 90 degrees                Plan: Continue with current plan of care       GO               [name], MA CCC-SLP  Acute  Rehabilitation Services Pager [phone] Office [phone]  Lynann Beaver 05/31/2018, 12:57 PM

## 2018-05-31 NOTE — Progress Notes (Signed)
[facility] PROGRESS NOTE    Progress Note  [name]  KPT:[id number] DOB: 1940-09-09 DOA: 05/23/2018 PCP: Lawerance Cruel, MD     Brief Narrative:   [name] is an 78 y.o. male past medical history of diastolic heart failure, history of GI bleed, dementia anemia of chronic disease PE on Coumadin found to be lethargic facility on the day of admission.  Code stroke was called admission, TPA was not given he was found to be febrile hypotensive tachycardic and hypoxic in the ED CT of the head negative for stroke, CT Angie of the head and neck showed no significant stenosis. Patient was found to have COVID-19 positive PCR. Comes from home.  Subjective:   Shin in bed, appears weak, denies any headache chest or abdominal pain.  Assessment/Plan:   Sirs secondary to COVID-19: Acute respiratory failure with hypoxia due to COVID-19 virus infection -   His respiratory failure has improved he is currently between 2 to 3 L nasal cannula oxygen, his main issue now seems to be generalized global decline, he has been eating and drinking minimally, at baseline is bedbound with multiple bedsores, moderately demented but did have good conversations with wife on a regular basis. Bacterial infection ruled out all antibiotics were stopped on 05/28/2018.  He was quite somnolent and minimally responsive on 05/28/2018, likely due to underlying Addison's disease on top of his acute COVID infection, he was placed on stress dose IV steroids on 05/28/2018 with improvement.  Diet changed to soft, speech therapy to evaluate.  Continues to be quite sick with guarded prognosis but overall clinically showing gradual improvement since the stress dose steroids were added, speech on board, increasing diet and activity.  Will require SNF placement.  Will repeat COVID test on 05/30/2018.  COVID-19 Labs  Recent Labs    05/29/18 0731 05/29/18 0732 05/30/18 0500 05/31/18 0600  DDIMER  --  0.94* 0.66* 0.58*   FERRITIN 309  --  324 66  CRP 16.7*  --  7.2* 6.7*    Lab Results  Component Value Date   SARSCOV2NAA POSITIVE (A) 17/00/1749    Acute metabolic encephalopathy with right-sided weakness along with mild dysphagia: MRI brain was negative recently, this appears to be nonspecific encephalopathy as explained in #1 above.  See changes above.  Minimize narcotics and benzodiazepines.  For his stroke he is on Coumadin pharmacy monitoring INR.  Adrenal insufficiency/Addison's disease: Switched to stress dose IV steroids on 05/28/2018 will monitor.  Dysphagia.  Likely due to generalized weakness, MRI was recently negative, speech following.  Currently on dysphagia 1 diet.    Asp PNA - developing night of 05/30/18, speech already following, he is already on dysphagia 1 diet with feeding assistance aspiration precautions, placed on Unasyn.  Monitor.  Likely bad prognostic sign in the light of advance deconditioning, weakness and underlying dementia along with COVID-19 infection.  Wife updated.  Very realistic.  If declines wants comfort measures.    Chronic diastolic heart failure EF 55% in 2019: Has been diuresed earlier, now on Coreg we will continue to monitor.  Hypokalemia : Replaced.  Diabetes mellitus type 2: A1c was 5.8. diet controlled.  History of PE and left leg clots on Coumadin: Continue current medication. Is currently therapeutic.  Lab Results  Component Value Date   INR 4.3 (HH) 05/31/2018   INR 4.0 (H) 05/30/2018   INR 4.0 (H) 05/29/2018    Hypothyroidism: Continue Synthroid.  History of dementia without behavioral disturbances: No events  overnight continue to oral Depakote.  New onset Diarrhea: Diarrhea has resolved will discontinue his Flexi-Seal.  Non-anion gap metabolic acidosis: Due to diarrhea improving.  Supportive care.   Chronic kidney disease stage III: at baseline.   Present on admission various pressure ulcer anywhere from stage II to stage III:  Turn patient  every 2 hours.  RN Pressure Injury Documentation: Pressure Injury 12/31/17 Unstageable - Full thickness tissue loss in which the base of the ulcer is covered by slough (yellow, tan, gray, green or brown) and/or eschar (tan, brown or black) in the wound bed. 2 areas of unstageable pressure injuries (Active)  12/31/17 1451  Location: Hip  Location Orientation: Right  Staging: Unstageable - Full thickness tissue loss in which the base of the ulcer is covered by slough (yellow, tan, gray, green or brown) and/or eschar (tan, brown or black) in the wound bed.  Wound Description (Comments): 2 areas of unstageable pressure injuries  Present on Admission: Yes     Pressure Injury 12/31/17 Stage III -  Full thickness tissue loss. Subcutaneous fat may be visible but bone, tendon or muscle are NOT exposed. this is a full thickness wound, NOT a pressure injury (Active)  12/31/17 1452  Location: Foot  Location Orientation: Left  Staging: Stage III -  Full thickness tissue loss. Subcutaneous fat may be visible but bone, tendon or muscle are NOT exposed.  Wound Description (Comments): this is a full thickness wound, NOT a pressure injury  Present on Admission: Yes     Pressure Injury 12/31/17 Stage II -  Partial thickness loss of dermis presenting as a shallow open ulcer with a red, pink wound bed without slough. these are partial thickness wounds, NOT pressure injuries (Active)  12/31/17 1453  Location: Back  Location Orientation: Right  Staging: Stage II -  Partial thickness loss of dermis presenting as a shallow open ulcer with a red, pink wound bed without slough.  Wound Description (Comments): these are partial thickness wounds, NOT pressure injuries  Present on Admission: Yes    Estimated body mass index is 26.92 kg/m as calculated from the following:   Height as of this encounter: 5\' 10"  (1.778 m).   Weight as of this encounter: 85.1 kg.   DVT prophylaxis: lovenox Family Communication: I  have spoken to his wife over the phone on 05/28/18, 05/29/18 Disposition Plan/Barrier to D/C: SNF  Code Status:  DNR   IV Access:    Peripheral IV   Procedures and diagnostic studies:   Dg Chest Port 1 View  Result Date: 05/31/2018 CLINICAL DATA:  Shortness of breath. EXAM: PORTABLE CHEST 1 VIEW COMPARISON:  Chest x-rays dated 05/30/2018, 05/25/2018, 05/14/2018 and 12/31/2017 and CT scan dated 04/22/2017 FINDINGS: Heart size is normal. Aortic atherosclerosis. There is new hazy density in the right perihilar region with new slight haziness at the left lung base medially. No discrete consolidation or effusions. No acute bone abnormality. IMPRESSION: Small hazy areas of infiltrate in the right perihilar region and at the left base medially. Aortic Atherosclerosis (ICD10-I70.0). Electronically Signed   By: Lorriane Shire M.D.   On: 05/31/2018 09:26   Dg Chest Port 1 View  Result Date: 05/30/2018 CLINICAL DATA:  Shortness of breath EXAM: PORTABLE CHEST 1 VIEW COMPARISON:  05/30/2018 FINDINGS: The heart size and mediastinal contours are within normal limits. Both lungs are clear. The visualized skeletal structures are unremarkable. IMPRESSION: No active disease. Electronically Signed   By: [name]   On: 05/30/2018 15:51  Dg Chest Port 1 View  Result Date: 05/30/2018 CLINICAL DATA:  Shortness of breath. EXAM: PORTABLE CHEST 1 VIEW COMPARISON:  Chest x-ray dated May 25, 2018. FINDINGS: The heart size and mediastinal contours are within normal limits. Atherosclerotic calcification of the aortic arch. Normal pulmonary vascularity. Low lung volumes with mild bibasilar atelectasis. Additional linear atelectasis in the left mid lung. No focal consolidation, pleural effusion, or pneumothorax. No acute osseous abnormality. IMPRESSION: Low lung volumes.  No active disease. Electronically Signed   By: Titus Dubin M.D.   On: 05/30/2018 08:11     Medical Consultants:    None.  Anti-Infectives:    Antibiotics Given (last 72 hours)    Date/Time Action Medication Dose Rate   05/31/18 0810 New Bag/Given   Ampicillin-Sulbactam (UNASYN) 3 g in sodium chloride 0.9 % 100 mL IVPB 3 g 200 mL/hr      Objective:    Vitals:   05/31/18 0540 05/31/18 0745 05/31/18 0753 05/31/18 0800  BP: (!) 143/75   (!) 144/74  Pulse: 92 85  87  Resp: (!) 22 (!) 26  (!) 25  Temp: 97.9 F (36.6 C)  98.3 F (36.8 C)   TempSrc: Oral  Oral   SpO2: 95% 94%  94%  Weight:      Height:        Intake/Output Summary (Last 24 hours) at 05/31/2018 1021 Last data filed at 05/31/2018 0851 Gross per 24 hour  Intake 1499.73 ml  Output 675 ml  Net 824.73 ml   Filed Weights   05/29/18 0500 05/30/18 0104 05/31/18 0422  Weight: 83.9 kg 85.2 kg 85.1 kg    Exam:  Patient in bed overall frail but more responsive, answered basic questions and follow basic commands, has a condom catheter, weak cough reflex, mild L leg swelling ( chronic) [place].AT,PERRAL Supple Neck,No JVD, No cervical lymphadenopathy appriciated.  Symmetrical Chest wall movement, Good air movement bilaterally, few R.sided rales RRR,No Gallops, Rubs or new Murmurs, No Parasternal Heave +ve B.Sounds, Abd Soft, No tenderness, No organomegaly appriciated, No rebound - guarding or rigidity. No Cyanosis, Clubbing or edema, No new Rash or bruise     Data Reviewed:    Labs: Basic Metabolic Panel: Recent Labs  Lab 05/27/18 0500 05/28/18 0455 05/29/18 0732 05/30/18 0500 05/31/18 0600  NA 139 141 140 142 140  K 2.9* 3.7 3.2* 3.5 3.4*  CL 109 115* 115* 114* 114*  CO2 18* 14* 17* 18* 18*  GLUCOSE 90 58* 127* 127* 132*  BUN 17 17 22  24* 21  CREATININE 1.14 1.03 1.22 1.07 1.08  CALCIUM 7.9* 7.4* 7.8* 7.9* 7.7*  MG 1.8 2.0 2.1 2.0 2.0  PHOS  --   --  2.7  --   --    GFR Estimated Creatinine Clearance: 59.1 mL/min (by C-G formula based on SCr of 1.08 mg/dL). Liver Function Tests: Recent Labs  Lab 05/26/18 0600 05/27/18 0500 05/28/18 0455  05/30/18 0500 05/31/18 0600  AST 28 28 29 28 25   ALT 25 23 17 22 23   ALKPHOS 62 65 49 51 51  BILITOT 0.8 1.0 0.5 0.5 0.7  PROT 6.3* 6.3* 5.0* 6.0* 5.9*  ALBUMIN 2.7* 2.6* 2.1* 2.3* 2.4*   No results for input(s): LIPASE, AMYLASE in the last 168 hours. No results for input(s): AMMONIA in the last 168 hours. Coagulation profile Recent Labs  Lab 05/27/18 0500 05/28/18 0455 05/29/18 0501 05/30/18 0500 05/31/18 0600  INR 3.8* 4.2* 4.0* 4.0* 4.3*    CBC: Recent  Labs  Lab 05/26/18 0600 05/27/18 0500 05/28/18 0748 05/29/18 0732 05/30/18 0500 05/31/18 0600  WBC 9.3 10.3 9.4 8.2 10.1 9.6  NEUTROABS 6.6 7.6 6.7  --  8.1* 7.9*  HGB 14.3 15.1 14.6 14.6 13.5 13.0  HCT 43.1 47.8 46.0 45.4 42.9 40.2  MCV 88.5 90.4 89.5 89.7 89.6 89.5  PLT 222 239 213 213 242 222   Cardiac Enzymes: No results for input(s): CKTOTAL, CKMB, CKMBINDEX, TROPONINI in the last 168 hours. BNP (last 3 results) No results for input(s): PROBNP in the last 8760 hours. CBG: No results for input(s): GLUCAP in the last 168 hours. D-Dimer: Recent Labs    05/30/18 0500 05/31/18 0600  DDIMER 0.66* 0.58*   Hgb A1c: No results for input(s): HGBA1C in the last 72 hours. Lipid Profile: No results for input(s): CHOL, HDL, LDLCALC, TRIG, CHOLHDL, LDLDIRECT in the last 72 hours. Thyroid function studies: No results for input(s): TSH, T4TOTAL, T3FREE, THYROIDAB in the last 72 hours.  Invalid input(s): FREET3 Anemia work up: Recent Labs    05/30/18 0500 05/31/18 0600  FERRITIN 324 66   Sepsis Labs: Recent Labs  Lab 05/25/18 0520 05/26/18 0600  05/28/18 0748 05/29/18 0732 05/30/18 0500 05/31/18 0600  PROCALCITON 0.92 0.45  --  0.26  --   --   --   WBC 11.2* 9.3   < > 9.4 8.2 10.1 9.6   < > = values in this interval not displayed.   Microbiology Recent Results (from the past 240 hour(s))  SARS Coronavirus 2 [facility] order, Performed in [facility] hospital lab)     Status: Abnormal   Collection  Time: 04/30/2018 12:10 PM  Result Value Ref Range Status   SARS Coronavirus 2 POSITIVE (A) NEGATIVE Final    Comment: RESULT CALLED TO, READ BACK BY AND VERIFIED WITH: RN Lowell Bouton [id number] MLM (NOTE) If result is NEGATIVE SARS-CoV-2 target nucleic acids are NOT DETECTED. The SARS-CoV-2 RNA is generally detectable in upper and lower  respiratory specimens during the acute phase of infection. The lowest  concentration of SARS-CoV-2 viral copies this assay can detect is 250  copies / mL. A negative result does not preclude SARS-CoV-2 infection  and should not be used as the sole basis for treatment or other  patient management decisions.  A negative result may occur with  improper specimen collection / handling, submission of specimen other  than nasopharyngeal swab, presence of viral mutation(s) within the  areas targeted by this assay, and inadequate number of viral copies  (<250 copies / mL). A negative result must be combined with clinical  observations, patient history, and epidemiological information. If result is POSITIVE SARS-CoV-2 target nucleic acids are DETECTED. The SARS-Co V-2 RNA is generally detectable in upper and lower  respiratory specimens during the acute phase of infection.  Positive  results are indicative of active infection with SARS-CoV-2.  Clinical  correlation with patient history and other diagnostic information is  necessary to determine patient infection status.  Positive results do  not rule out bacterial infection or co-infection with other viruses. If result is PRESUMPTIVE POSTIVE SARS-CoV-2 nucleic acids MAY BE PRESENT.   A presumptive positive result was obtained on the submitted specimen  and confirmed on repeat testing.  While 2019 novel coronavirus  (SARS-CoV-2) nucleic acids may be present in the submitted sample  additional confirmatory testing may be necessary for epidemiological  and / or clinical management purposes  to differentiate between    SARS-CoV-2 and other Sarbecovirus  currently known to infect humans.  If clinically indicated additional testing with an alternate test  methodology [phone]) is advised.  The SARS-CoV-2 RNA is generally  detectable in upper and lower respiratory specimens during the acute  phase of infection. The expected result is Negative. Fact Sheet for Patients:  StrictlyIdeas.no Fact Sheet for Healthcare Providers: BankingDealers.co.za This test is not yet approved or cleared by the [place] FDA and has been authorized for detection and/or diagnosis of SARS-CoV-2 by FDA under an Emergency Use Authorization (EUA).  This EUA will remain in effect (meaning this test can be used) for the duration of the COVID-19 declaration under Section 564(b)(1) of the Act, 21 U.S.C. section 360bbb-3(b)(1), unless the authorization is terminated or revoked sooner. Performed at [facility], [place] [address], [place], [place] [address]   Blood Culture (routine x 2)     Status: None   Collection Time: 05/08/2018 12:16 PM  Result Value Ref Range Status   Specimen Description BLOOD RIGHT ANTECUBITAL  Final   Special Requests   Final    BOTTLES DRAWN AEROBIC AND ANAEROBIC Blood Culture adequate volume   Culture   Final    NO GROWTH 5 DAYS Performed at [facility], [place] [address], [place], [place] [address]    Report Status 05/27/2018 FINAL  Final  Blood Culture (routine x 2)     Status: None   Collection Time: 04/29/2018  6:42 PM  Result Value Ref Range Status   Specimen Description BLOOD LEFT ANTECUBITAL  Final   Special Requests   Final    BOTTLES DRAWN AEROBIC ONLY Blood Culture results may not be optimal due to an inadequate volume of blood received in culture bottles   Culture   Final    NO GROWTH 5 DAYS Performed at [facility], [place] [address] [place], [place] [address]    Report Status 05/27/2018 FINAL  Final  MRSA PCR Screening      Status: None   Collection Time: 05/23/18 10:45 AM  Result Value Ref Range Status   MRSA by PCR NEGATIVE NEGATIVE Final    Comment:        The GeneXpert MRSA Assay (FDA approved for NASAL specimens only), is one component of a comprehensive MRSA colonization surveillance program. It is not intended to diagnose MRSA infection nor to guide or monitor treatment for MRSA infections. Performed at [facility], [place] [address], [place], [place] [address]   C Difficile Quick Screen w PCR reflex     Status: None   Collection Time: 05/24/18  4:52 PM  Result Value Ref Range Status   C Diff antigen NEGATIVE NEGATIVE Final   C Diff toxin NEGATIVE NEGATIVE Final   C Diff interpretation No C. difficile detected.  Final    Comment: Performed at [facility], [place] [address], [place], [place] [address]     Medications:    amLODipine  5 mg Oral Daily   carvedilol  6.25 mg Oral BID WC   cholecalciferol  1,000 Units Oral Daily   divalproex  125 mg Oral TID   feeding supplement (ENSURE ENLIVE)  237 mL Oral TID BM   hydrocortisone sod succinate (SOLU-CORTEF) inj  100 mg Intravenous Q8H   multivitamin with minerals  1 tablet Oral Daily   psyllium  1 packet Oral Daily   Warfarin - Pharmacist Dosing Inpatient   Does not apply q1800   Continuous Infusions:  sodium chloride 1,000 mL (  05/31/18 0808)   ampicillin-sulbactam (UNASYN) IV 3 g (05/31/18 0810)   dextrose 5 % and 0.45 % NaCl with KCl 10 mEq/L Stopped (05/31/18 0756)      LOS: 9 days   [facility]  05/31/2018, 10:21 AM

## 2018-05-31 NOTE — Progress Notes (Signed)
Physical Therapy Treatment Patient Details Name: [name] MRN: [id number] DOB: Dec 01, 1940 Today's Date: 05/31/2018    History of Present Illness 78 yo male presenting from SNF with AMS. Tested COVID positive. PMH including CHF, h/o GI bleeding, Addison's disease, dementia, anemia chronic disease, and pulmonary embolus.     PT Comments    Pt is very debilitated with 4 extremity tone, trunk rigidity.  I am familiar with him from a previous admission from [facility] and he is mildly better than when I last saw him.  He has significant cognitive and physical deficits.  PT will continue to follow acutely for safe mobility progression.  Follow Up Recommendations  SNF;Supervision/Assistance - 24 hour     Equipment Recommendations  None recommended by PT    Recommendations for Other Services   NA     Precautions / Restrictions Precautions Precautions: Fall Precaution Comments: significant tone all 4 extremities    Mobility  Bed Mobility Overal bed mobility: Needs Assistance             General bed mobility comments: total assist to scoot to HOB x 2 people      Balance                                            Cognition Arousal/Alertness: Awake/alert Behavior During Therapy: Flat affect Overall Cognitive Status: History of cognitive impairments - at baseline                               Problem Solving: Slow processing;Decreased initiation        Exercises General Exercises - Upper Extremity Shoulder Flexion: PROM;Both;10 reps Shoulder ABduction: PROM;Both;10 reps Elbow Flexion: PROM;Both;10 reps Wrist Extension: PROM;Both;10 reps General Exercises - Lower Extremity Ankle Circles/Pumps: PROM;Both;10 reps Heel Slides: PROM;Both;10 reps Hip ABduction/ADduction: PROM;Both;10 reps Other Exercises Other Exercises: Bed positioned in chair mode so that RN tech/SLP could assist with eating lunch.      General Comments  General comments (skin integrity, edema, etc.): VSS throughout on 2 L O2 [place]      Pertinent Vitals/Pain Pain Assessment: Faces Faces Pain Scale: Hurts little more Pain Location: when mobilizing Pain Descriptors / Indicators: Grimacing;Moaning Pain Intervention(s): Limited activity within patient's tolerance;Monitored during session;Repositioned           PT Goals (current goals can now be found in the care plan section) Acute Rehab PT Goals Patient Stated Goal: Unstated by pt. wife wanting him safe and return to rehab Progress towards PT goals: Progressing toward goals    Frequency    Min 2X/week      PT Plan Current plan remains appropriate       AM-PAC PT "6 Clicks" Mobility   Outcome Measure  Help needed turning from your back to your side while in a flat bed without using bedrails?: Total Help needed moving from lying on your back to sitting on the side of a flat bed without using bedrails?: Total Help needed moving to and from a bed to a chair (including a wheelchair)?: Total Help needed standing up from a chair using your arms (e.g., wheelchair or bedside chair)?: Total Help needed to walk in hospital room?: Total Help needed climbing 3-5 steps with a railing? : Total 6 Click Score: 6    End of Session Equipment Utilized  During Treatment: Oxygen Activity Tolerance: Patient tolerated treatment well Patient left: in bed;with call bell/phone within reach;Other (comment)(with SLP, Horris Latino)   PT Visit Diagnosis: Unsteadiness on feet (R26.81);Muscle weakness (generalized) (M62.81);Difficulty in walking, not elsewhere classified (R26.2)     Time: 1747-1595 PT Time Calculation (min) (ACUTE ONLY): 21 min  Charges:  $Therapeutic Exercise: 8-22 mins          Jamilex Bohnsack B. Vitalia Stough, PT, DPT  Acute Rehabilitation [phone] pager #[phone] office            05/31/2018, 3:30 PM

## 2018-05-31 NOTE — Progress Notes (Signed)
The ortho tech at [facility] was called regarding the new order for bilateral resting hand splints. The ortho tech indicated that the splints have been ordered and will arrive tomorrow morning.

## 2018-05-31 NOTE — Progress Notes (Signed)
ANTICOAGULATION CONSULT NOTE - Follow Up Consult  Pharmacy Consult for Warfarin Indication: Hx PE  Labs: Recent Labs    05/29/18 0501  05/29/18 0732 05/30/18 0500 05/31/18 0600  HGB  --    < > 14.6 13.5 13.0  HCT  --   --  45.4 42.9 40.2  PLT  --   --  213 242 222  LABPROT 38.6*  --   --  38.1* 40.5*  INR 4.0*  --   --  4.0* 4.3*  CREATININE  --   --  1.22 1.07 1.08   < > = values in this interval not displayed.    Estimated Creatinine Clearance: 59.1 mL/min (by C-G formula based on SCr of 1.08 mg/dL).   Medications:  Scheduled:  . amLODipine  5 mg Oral Daily  . carvedilol  6.25 mg Oral BID WC  . cholecalciferol  1,000 Units Oral Daily  . divalproex  125 mg Oral TID  . feeding supplement (ENSURE ENLIVE)  237 mL Oral TID BM  . hydrocortisone sod succinate (SOLU-CORTEF) inj  100 mg Intravenous Q8H  . multivitamin with minerals  1 tablet Oral Daily  . psyllium  1 packet Oral Daily  . Warfarin - Pharmacist Dosing Inpatient   Does not apply q1800    Assessment: 67 yom on warfarin PTA for PE/DVT history presenting with AMS, sepsis due to COVID positive. CVA work-up initiated. Noted remote hx of GIB, but no active bleed issues documented.  PTA warfarin dose: 5mg  daily except 2.5mg  on Tues/Fri (last dose 4/26 PTA).  Admission INR 2.6.  Today, 05/31/2018:  INR 4.3, supratherapeutic and increased despite holding x5 days.  Elevation likely d/t poor PO intake (eating 0-25% meals), antibiotics.  CBC remains stable/WNL  No bleeding or complications reported.  No major drug-drug interactions noted.   Goal of Therapy:  INR 2-3 Monitor platelets by anticoagulation protocol: Yes   Plan:  Hold warfarin today. Daily PT/INR. Monitor for signs and symptoms of bleeding.   Gretta Arab PharmD, BCPS 05/31/2018 11:16 AM

## 2018-05-31 NOTE — Progress Notes (Signed)
At 1048, the pt's Wife was called and updated.

## 2018-05-31 NOTE — Progress Notes (Signed)
Pharmacy Antibiotic Note  [name] is a 78 y.o. male admitted on 05/16/2018 with acute respiratory failure, COVID-19 +.  Now with suspected aspiration pneumonia.  Pharmacy has been consulted for Unasyn dosing.  WBC remains WNL Afebrile, Tm 99.7 SCr 1.08, Crcl ~ 59 ml/min   Plan: Unasyn 3g IV q6h Follow up renal function, culture results, and clinical course.    Height: 5\' 10"  (177.8 cm) Weight: 187 lb 9.8 oz (85.1 kg) IBW/kg (Calculated) : 73  Temp (24hrs), Avg:98.3 F (36.8 C), Min:97.6 F (36.4 C), Max:99.7 F (37.6 C)  Recent Labs  Lab 05/26/18 0600 05/27/18 0500 05/28/18 0455 05/28/18 0748 05/29/18 0732 05/30/18 0500 05/31/18 0600  WBC 9.3 10.3  --  9.4 8.2 10.1 9.6  CREATININE 1.05 1.14 1.03  --  1.22 1.07  --     Estimated Creatinine Clearance: 59.7 mL/min (by C-G formula based on SCr of 1.07 mg/dL).    No Known Allergies  Antimicrobials this admission: 4/27 Flagyl x1 4/27 Vanc >>4/28; 4/30>> 5/3 4/27 Cefepime >> 5/3 4/28 Azithro >> 5/3 5/6 Unasyn >>   Dose adjustments this admission:  Microbiology results: 4/27 COVID - POSITIVE  4/27 BCx: NGF 4/28 MRSA PCR: negative 4/29 Cdiff: Ag neg, Toxin neg  Thank you for allowing pharmacy to be a part of this patient's care.  Gretta Arab PharmD, BCPS 05/31/2018 7:33 AM

## 2018-06-01 LAB — CBC WITH DIFFERENTIAL/PLATELET
Abs Immature Granulocytes: 0.09 10*3/uL — ABNORMAL HIGH (ref 0.00–0.07)
Basophils Absolute: 0 10*3/uL (ref 0.0–0.1)
Basophils Relative: 0 %
Eosinophils Absolute: 0 10*3/uL (ref 0.0–0.5)
Eosinophils Relative: 0 %
HCT: 40.7 % (ref 39.0–52.0)
Hemoglobin: 12.8 g/dL — ABNORMAL LOW (ref 13.0–17.0)
Immature Granulocytes: 1 %
Lymphocytes Relative: 9 %
Lymphs Abs: 1.2 10*3/uL (ref 0.7–4.0)
MCH: 28.1 pg (ref 26.0–34.0)
MCHC: 31.4 g/dL (ref 30.0–36.0)
MCV: 89.3 fL (ref 80.0–100.0)
Monocytes Absolute: 0.6 10*3/uL (ref 0.1–1.0)
Monocytes Relative: 5 %
Neutro Abs: 10.4 10*3/uL — ABNORMAL HIGH (ref 1.7–7.7)
Neutrophils Relative %: 85 %
Platelets: 206 10*3/uL (ref 150–400)
RBC: 4.56 MIL/uL (ref 4.22–5.81)
RDW: 16.4 % — ABNORMAL HIGH (ref 11.5–15.5)
WBC: 12.3 10*3/uL — ABNORMAL HIGH (ref 4.0–10.5)
nRBC: 0 % (ref 0.0–0.2)

## 2018-06-01 LAB — COMPREHENSIVE METABOLIC PANEL
ALT: 23 U/L (ref 0–44)
AST: 21 U/L (ref 15–41)
Albumin: 2.3 g/dL — ABNORMAL LOW (ref 3.5–5.0)
Alkaline Phosphatase: 51 U/L (ref 38–126)
Anion gap: 7 (ref 5–15)
BUN: 19 mg/dL (ref 8–23)
CO2: 21 mmol/L — ABNORMAL LOW (ref 22–32)
Calcium: 7.7 mg/dL — ABNORMAL LOW (ref 8.9–10.3)
Chloride: 116 mmol/L — ABNORMAL HIGH (ref 98–111)
Creatinine, Ser: 0.93 mg/dL (ref 0.61–1.24)
GFR calc Af Amer: >60 mL/min (ref >60)
GFR calc non Af Amer: >60 mL/min (ref >60)
Glucose, Bld: 106 mg/dL — ABNORMAL HIGH (ref 70–99)
Potassium: 3.2 mmol/L — ABNORMAL LOW (ref 3.5–5.1)
Sodium: 144 mmol/L (ref 135–145)
Total Bilirubin: 0.6 mg/dL (ref 0.3–1.2)
Total Protein: 5.7 g/dL — ABNORMAL LOW (ref 6.5–8.1)

## 2018-06-01 LAB — PROTIME-INR
INR: 4.9 (ref 0.8–1.2)
Prothrombin Time: 44.9 seconds — ABNORMAL HIGH (ref 11.4–15.2)

## 2018-06-01 LAB — VITAMIN C: Vitamin C: 0.6 mg/dL (ref 0.4–2.0)

## 2018-06-01 LAB — D-DIMER, QUANTITATIVE: D-Dimer, Quant: 0.39 ug/mL-FEU (ref 0.00–0.50)

## 2018-06-01 LAB — SARS CORONAVIRUS 2 BY RT PCR (HOSPITAL ORDER, PERFORMED IN ~~LOC~~ HOSPITAL LAB): SARS Coronavirus 2: POSITIVE — AB

## 2018-06-01 LAB — ZINC: Zinc: 47 ug/dL — ABNORMAL LOW (ref 56–134)

## 2018-06-01 LAB — FERRITIN: Ferritin: 53 ng/mL (ref 24–336)

## 2018-06-01 LAB — MAGNESIUM: Magnesium: 2 mg/dL (ref 1.7–2.4)

## 2018-06-01 LAB — C-REACTIVE PROTEIN: CRP: 11 mg/dL — ABNORMAL HIGH (ref <1.0)

## 2018-06-01 MED ORDER — ORAL CARE MOUTH RINSE
15.0000 mL | Freq: Two times a day (BID) | OROMUCOSAL | Status: DC
  Administered 2018-06-01 – 2018-06-05 (×9): 15 mL via OROMUCOSAL

## 2018-06-01 MED ORDER — POTASSIUM CHLORIDE 10 MEQ/100ML IV SOLN
10.0000 meq | INTRAVENOUS | Status: AC
  Administered 2018-06-01 (×4): 10 meq via INTRAVENOUS
  Filled 2018-06-01 (×4): qty 100

## 2018-06-01 MED ORDER — ZINC SULFATE 220 (50 ZN) MG PO CAPS
220.0000 mg | ORAL_CAPSULE | Freq: Every day | ORAL | Status: DC
  Administered 2018-06-01 – 2018-06-03 (×3): 220 mg via ORAL
  Filled 2018-06-01 (×3): qty 1

## 2018-06-01 MED ORDER — CHLORHEXIDINE GLUCONATE 0.12 % MT SOLN
15.0000 mL | Freq: Two times a day (BID) | OROMUCOSAL | Status: DC
  Administered 2018-06-01 – 2018-06-05 (×8): 15 mL via OROMUCOSAL
  Filled 2018-06-01 (×6): qty 15

## 2018-06-01 MED ORDER — SODIUM CHLORIDE 0.9% FLUSH
3.0000 mL | Freq: Two times a day (BID) | INTRAVENOUS | Status: DC
  Administered 2018-06-01 – 2018-06-05 (×7): 3 mL via INTRAVENOUS

## 2018-06-01 NOTE — Progress Notes (Addendum)
Updated wife by phone. Assisted patient to talk to wife on phone.

## 2018-06-01 NOTE — Progress Notes (Signed)
CRITICAL VALUE ALERT  Critical Value:  INR 4.9  Date & Time Notied:  5/7 0640  Provider Notified: S.Purohito  Orders Received/Actions taken: Hold Warfarin

## 2018-06-01 NOTE — Progress Notes (Signed)
Patient had 18 runs of SVT. MD informed. Will continue to monitor.

## 2018-06-01 NOTE — Progress Notes (Signed)
Physical Therapy Treatment Patient Details Name: [name] MRN: [id number] DOB: [birth year] Today's Date: 06/01/2018    History of Present Illness 78 yo male presenting from SNF with AMS. Tested COVID positive. PMH including CHF, h/o GI bleeding, Addison's disease, dementia, anemia chronic disease, and pulmonary embolus.     PT Comments    Pt  Seen for EOB, continues to require total assist, able to maintain midline briefly with min assist but majority of time pt with significant posterior bias; pt appeared to be more comfortable after ROM and repositioning, UEs relaxed in to neutral (he was with significant bil UE flexion and squeezing wash cloths tightly both hands on arrival.    Follow Up Recommendations  SNF;Supervision/Assistance - 24 hour     Equipment Recommendations  None recommended by PT    Recommendations for Other Services       Precautions / Restrictions Precautions Precautions: Fall Precaution Comments: significant tone all 4 extremities Restrictions Weight Bearing Restrictions: No    Mobility  Bed Mobility Overal bed mobility: Needs Assistance Bed Mobility: Rolling;Sidelying to Sit;Sit to Sidelying Rolling: Max assist;+2 for physical assistance Sidelying to sit: +2 for physical assistance;Max assist   Sit to supine: Total assist;+2 for physical assistance;+2 for safety/equipment   General bed mobility comments: total assist to repostion in bed, partial chair position/HOB and knees elevated, repositioned LEs in neutral, pt tends to externally rotate bil hips R>L  Transfers                    Ambulation/Gait                 Stairs             Wheelchair Mobility    Modified Rankin (Stroke Patients Only)       Balance                                            Cognition Arousal/Alertness: Awake/alert Behavior During Therapy: Flat affect Overall Cognitive Status: History of cognitive impairments -  at baseline                         Following Commands: Follows one step commands inconsistently     Problem Solving: Slow processing;Decreased initiation General Comments: providing one word responses intermittently      Exercises General Exercises - Upper Extremity Shoulder Flexion: PROM;Both;10 reps Elbow Flexion: PROM;Both;10 reps Elbow Extension: PROM;Both;10 reps Wrist Flexion: PROM;Both;10 reps Wrist Extension: PROM;Both;10 reps Digit Composite Flexion: PROM;Both;10 reps Composite Extension: PROM;Both;10 reps General Exercises - Lower Extremity Ankle Circles/Pumps: PROM;Both;10 reps Heel Slides: PROM;Both;10 reps Hip ABduction/ADduction: PROM;Both;10 reps Shoulder Exercises Shoulder External Rotation: PROM;Both;10 reps;Supine Neck Lateral Flexion - Right: PROM;Supine Neck Lateral Flexion - Left: PROM;Supine;Limitations Neck Lateral Flexion - Left Limitations: unable to range out of midline with very gentle overpressure Other Exercises Other Exercises: LE hip internal/external rotation bil x 10 reps PROM    General Comments        Pertinent Vitals/Pain Pain Assessment: Faces Faces Pain Scale: Hurts little more Pain Location: with ROM Pain Descriptors / Indicators: Grimacing Pain Intervention(s): Monitored during session;Repositioned    Home Living                      Prior Function  PT Goals (current goals can now be found in the care plan section) Acute Rehab PT Goals Patient Stated Goal: Unstated by pt. wife wanting him safe and return to rehab PT Goal Formulation: With patient Time For Goal Achievement: 06/06/18 Potential to Achieve Goals: Fair Progress towards PT goals: Progressing toward goals    Frequency    Min 2X/week      PT Plan Current plan remains appropriate    Co-evaluation              AM-PAC PT "6 Clicks" Mobility   Outcome Measure  Help needed turning from your back to your side while in  a flat bed without using bedrails?: Total Help needed moving from lying on your back to sitting on the side of a flat bed without using bedrails?: Total Help needed moving to and from a bed to a chair (including a wheelchair)?: Total Help needed standing up from a chair using your arms (e.g., wheelchair or bedside chair)?: Total Help needed to walk in hospital room?: Total Help needed climbing 3-5 steps with a railing? : Total 6 Click Score: 6    End of Session Equipment Utilized During Treatment: Oxygen Activity Tolerance: Patient tolerated treatment well Patient left: in bed;with call bell/phone within reach;Other (comment)   PT Visit Diagnosis: Muscle weakness (generalized) (M62.81)     Time: 6962-9528 PT Time Calculation (min) (ACUTE ONLY): 26 min  Charges:  $Therapeutic Exercise: 8-22 mins $Therapeutic Activity: 8-22 mins                     Kenyon Ana, PT  Pager: [phone] Acute Rehab Dept [facility]): [phone]   06/01/2018    [facility] 06/01/2018, 5:19 PM

## 2018-06-01 NOTE — Progress Notes (Signed)
ANTICOAGULATION CONSULT NOTE - Follow Up Consult  Pharmacy Consult for Warfarin Indication: Hx PE  Labs: Recent Labs    05/30/18 0500 05/31/18 0600 06/01/18 0421  HGB 13.5 13.0 12.8*  HCT 42.9 40.2 40.7  PLT 242 222 206  LABPROT 38.1* 40.5* 44.9*  INR 4.0* 4.3* 4.9*  CREATININE 1.07 1.08 0.93    Estimated Creatinine Clearance: 68.7 mL/min (by C-G formula based on SCr of 0.93 mg/dL).   Medications:  Scheduled:  . amLODipine  5 mg Oral Daily  . carvedilol  6.25 mg Oral BID WC  . chlorhexidine  15 mL Mouth Rinse BID  . cholecalciferol  1,000 Units Oral Daily  . divalproex  125 mg Oral TID  . feeding supplement (ENSURE ENLIVE)  237 mL Oral TID BM  . hydrocortisone sod succinate (SOLU-CORTEF) inj  100 mg Intravenous Q8H  . mouth rinse  15 mL Mouth Rinse q12n4p  . multivitamin with minerals  1 tablet Oral Daily  . psyllium  1 packet Oral Daily  . sodium chloride flush  3 mL Intravenous Q12H  . Warfarin - Pharmacist Dosing Inpatient   Does not apply q1800  . zinc sulfate  220 mg Oral Daily    Assessment: 89 yom on warfarin PTA for PE/DVT history presenting with AMS, sepsis due to COVID positive. CVA work-up initiated. Noted remote hx of GIB, but no active bleed issues documented.  PTA warfarin dose: 5mg  daily except 2.5mg  on Tues/Fri (last dose 4/26 PTA).  Admission INR 2.6.  Today, 06/01/2018:  INR 4.9, supratherapeutic and increased despite holding x6 days.  Elevation likely d/t poor PO intake (eating 0-25% meals), antibiotics.  CBC: Hgb decreased to 12.8, Plt remain stable/WNL  No bleeding or complications reported.  No major drug-drug interactions noted.   Goal of Therapy:  INR 2-3 Monitor platelets by anticoagulation protocol: Yes   Plan:  Hold warfarin today. Daily PT/INR. Monitor for signs and symptoms of bleeding.   Gretta Arab PharmD, BCPS 06/01/2018 12:10 PM

## 2018-06-01 NOTE — Progress Notes (Addendum)
[facility] PROGRESS NOTE    Progress Note  [name]  ZDG:[id number] DOB: 1940-07-07 DOA: 05/12/2018 PCP: Lawerance Cruel, MD     Brief Narrative:   [name] is an 78 y.o. male past medical history of diastolic heart failure, history of GI bleed, dementia anemia of chronic disease PE on Coumadin found to be lethargic facility on the day of admission.  Code stroke was called admission, TPA was not given he was found to be febrile hypotensive tachycardic and hypoxic in the ED CT of the head negative for stroke, CT Angio of the head and neck showed no significant stenosis. Patient was found to have COVID-19 positive PCR.  Was subsequently transferred to [facility].    He is now doing better from the [place] standpoint however due to his underlying dementia, deconditioned bedbound status and the insult of acute infection he has developed dysphagia and aspiration pneumonia.  Subjective:   Patient in bed, appears weak having breakfast with help, in no distress, denies any headache chest or abdominal pain.  Assessment/Plan:   Sirs secondary to COVID-19: Acute respiratory failure with hypoxia due to COVID-19 virus infection -   His respiratory failure has improved he is currently between 2 to 3 L nasal cannula oxygen, his main issue now seems to be generalized global decline, he has been eating and drinking minimally, at baseline is bedbound with multiple bedsores, moderately demented but did have good conversations with wife on a regular basis.  COVID-19 infection itself is clinically stable, inflammatory markers have stabilized.  His main problem now is ongoing aspiration with aspiration pneumonitis due to underlying deconditioning weakness and dysphagia.  Repeat COVID-19 test has been ordered for 06/01/2018.  COVID-19 Labs  Recent Labs    05/30/18 0500 05/31/18 0600 06/01/18 0421  DDIMER 0.66* 0.58* 0.39  FERRITIN 324 66 53  CRP 7.2* 6.7* 11.0*    Lab Results   Component Value Date   SARSCOV2NAA POSITIVE (A) 05/20/2018    Ongoing dysphagia with aspiration pneumonia.  This seems to be his current problem, due to underlying dementia, deconditioning, bedbound status and acute insult of COVID-19 infection he has developed dysphagia.  Speech is following currently on dysphagia 1 diet with nectar thick liquids.  Feeding assistance and aspiration precautions.  Responding to Unasyn continue.  Overall status is tenuous due to high risk for future aspiration.  Wife understands.  If he declines she is okay with comfort measures.  He is DNR.  Continue stress dose steroids.  Acute metabolic encephalopathy with right-sided weakness along with mild dysphagia: MRI brain was negative recently and he was seen by neurologist at [facility] earlier in this admission, currently has hospital-acquired delirium/encephalopathy on top of his underlying dementia..  Adrenal insufficiency/Addison's disease: Switched to stress dose IV steroids on 05/28/2018 will monitor.  Gradually taper down steroids once clinically improved from aspiration pneumonia standpoint.  Chronic diastolic heart failure EF 55% in 2019: Has been diuresed earlier, now on Coreg we will continue to monitor.  Hypokalemia : Laced again IV monitor.  Diabetes mellitus type 2: A1c was 5.8. diet controlled.  History of PE and left leg clots on Coumadin: Continue current medication. Is currently therapeutic.  Lab Results  Component Value Date   INR 4.9 (HH) 06/01/2018   INR 4.3 (HH) 05/31/2018   INR 4.0 (H) 05/30/2018    Hypothyroidism: Continue Synthroid.  History of dementia without behavioral disturbances: No events overnight continue to oral Depakote.  New onset Diarrhea: Diarrhea has  resolved will discontinue his Flexi-Seal.  Non-anion gap metabolic acidosis: Due to diarrhea improving.  Supportive care.   Chronic kidney disease stage III: at baseline.   Present on admission various pressure ulcer  anywhere from stage II to stage III:  Turn patient every 2 hours.  RN Pressure Injury Documentation: Pressure Injury 12/31/17 Unstageable - Full thickness tissue loss in which the base of the ulcer is covered by slough (yellow, tan, gray, green or brown) and/or eschar (tan, brown or black) in the wound bed. 2 areas of unstageable pressure injuries (Active)  12/31/17 1451  Location: Hip  Location Orientation: Right  Staging: Unstageable - Full thickness tissue loss in which the base of the ulcer is covered by slough (yellow, tan, gray, green or brown) and/or eschar (tan, brown or black) in the wound bed.  Wound Description (Comments): 2 areas of unstageable pressure injuries  Present on Admission: Yes     Pressure Injury 12/31/17 Stage III -  Full thickness tissue loss. Subcutaneous fat may be visible but bone, tendon or muscle are NOT exposed. this is a full thickness wound, NOT a pressure injury (Active)  12/31/17 1452  Location: Foot  Location Orientation: Left  Staging: Stage III -  Full thickness tissue loss. Subcutaneous fat may be visible but bone, tendon or muscle are NOT exposed.  Wound Description (Comments): this is a full thickness wound, NOT a pressure injury  Present on Admission: Yes     Pressure Injury 12/31/17 Stage II -  Partial thickness loss of dermis presenting as a shallow open ulcer with a red, pink wound bed without slough. these are partial thickness wounds, NOT pressure injuries (Active)  12/31/17 1453  Location: Back  Location Orientation: Right  Staging: Stage II -  Partial thickness loss of dermis presenting as a shallow open ulcer with a red, pink wound bed without slough.  Wound Description (Comments): these are partial thickness wounds, NOT pressure injuries  Present on Admission: Yes    Estimated body mass index is 26.35 kg/m as calculated from the following:   Height as of this encounter: 5\' 10"  (1.778 m).   Weight as of this encounter: 83.3 kg.    DVT prophylaxis: lovenox Family Communication: I have spoken to his wife over the phone on 05/28/18, 05/29/18 Disposition Plan/Barrier to D/C: SNF  Code Status:  DNR   IV Access:    Peripheral IV   Procedures and diagnostic studies:   Dg Chest Port 1 View  Result Date: 05/31/2018 CLINICAL DATA:  Shortness of breath. EXAM: PORTABLE CHEST 1 VIEW COMPARISON:  Chest x-rays dated 05/30/2018, 05/25/2018, 05/06/2018 and 12/31/2017 and CT scan dated 04/22/2017 FINDINGS: Heart size is normal. Aortic atherosclerosis. There is new hazy density in the right perihilar region with new slight haziness at the left lung base medially. No discrete consolidation or effusions. No acute bone abnormality. IMPRESSION: Small hazy areas of infiltrate in the right perihilar region and at the left base medially. Aortic Atherosclerosis (ICD10-I70.0). Electronically Signed   By: Lorriane Shire M.D.   On: 05/31/2018 09:26   Dg Chest Port 1 View  Result Date: 05/30/2018 CLINICAL DATA:  Shortness of breath EXAM: PORTABLE CHEST 1 VIEW COMPARISON:  05/30/2018 FINDINGS: The heart size and mediastinal contours are within normal limits. Both lungs are clear. The visualized skeletal structures are unremarkable. IMPRESSION: No active disease. Electronically Signed   By: [name]   On: 05/30/2018 15:51     Medical Consultants:    None.  Anti-Infectives:  Antibiotics Given (last 72 hours)    Date/Time Action Medication Dose Rate   05/31/18 0810 New Bag/Given   Ampicillin-Sulbactam (UNASYN) 3 g in sodium chloride 0.9 % 100 mL IVPB 3 g 200 mL/hr   05/31/18 1320 New Bag/Given   Ampicillin-Sulbactam (UNASYN) 3 g in sodium chloride 0.9 % 100 mL IVPB 3 g 200 mL/hr   05/31/18 2019 New Bag/Given   Ampicillin-Sulbactam (UNASYN) 3 g in sodium chloride 0.9 % 100 mL IVPB 3 g 200 mL/hr   06/01/18 0300 New Bag/Given   Ampicillin-Sulbactam (UNASYN) 3 g in sodium chloride 0.9 % 100 mL IVPB 3 g 200 mL/hr   06/01/18 0846 New  Bag/Given   Ampicillin-Sulbactam (UNASYN) 3 g in sodium chloride 0.9 % 100 mL IVPB 3 g 200 mL/hr      Objective:    Vitals:   06/01/18 0400 06/01/18 0614 06/01/18 0800 06/01/18 1013  BP: 121/74  138/77 140/74  Pulse: 81  87 (!) 103  Resp: (!) 23  20 18   Temp: 99.4 F (37.4 C)   99.7 F (37.6 C)  TempSrc: Axillary   Axillary  SpO2: (!) 88% 90% 90%   Weight:  83.3 kg    Height:        Intake/Output Summary (Last 24 hours) at 06/01/2018 1105 Last data filed at 06/01/2018 1015 Gross per 24 hour  Intake 973.2 ml  Output 1250 ml  Net -276.8 ml   Filed Weights   05/30/18 0104 05/31/18 0422 06/01/18 0614  Weight: 85.2 kg 85.1 kg 83.3 kg    Exam:  Patient in bed overall frail but more responsive, answered basic questions and follow basic commands, weak cough reflex,  Vonore.AT,PERRAL Supple Neck,No JVD, No cervical lymphadenopathy appriciated.  Symmetrical Chest wall movement, Good air movement bilaterally, few R-rales,  RRR,No Gallops, Rubs or new Murmurs, No Parasternal Heave +ve B.Sounds, Abd Soft, No tenderness, No organomegaly appriciated, No rebound - guarding or rigidity, has a condom catheter, No Cyanosis, Clubbing , mild L leg swelling ( chronic)   Data Reviewed:    Labs: Basic Metabolic Panel: Recent Labs  Lab 05/28/18 0455 05/29/18 0732 05/30/18 0500 05/31/18 0600 06/01/18 0421  NA 141 140 142 140 144  K 3.7 3.2* 3.5 3.4* 3.2*  CL 115* 115* 114* 114* 116*  CO2 14* 17* 18* 18* 21*  GLUCOSE 58* 127* 127* 132* 106*  BUN 17 22 24* 21 19  CREATININE 1.03 1.22 1.07 1.08 0.93  CALCIUM 7.4* 7.8* 7.9* 7.7* 7.7*  MG 2.0 2.1 2.0 2.0 2.0  PHOS  --  2.7  --   --   --    GFR Estimated Creatinine Clearance: 68.7 mL/min (by C-G formula based on SCr of 0.93 mg/dL). Liver Function Tests: Recent Labs  Lab 05/27/18 0500 05/28/18 0455 05/30/18 0500 05/31/18 0600 06/01/18 0421  AST 28 29 28 25 21   ALT 23 17 22 23 23   ALKPHOS 65 49 51 51 51  BILITOT 1.0 0.5 0.5 0.7  0.6  PROT 6.3* 5.0* 6.0* 5.9* 5.7*  ALBUMIN 2.6* 2.1* 2.3* 2.4* 2.3*   No results for input(s): LIPASE, AMYLASE in the last 168 hours. No results for input(s): AMMONIA in the last 168 hours. Coagulation profile Recent Labs  Lab 05/28/18 0455 05/29/18 0501 05/30/18 0500 05/31/18 0600 06/01/18 0421  INR 4.2* 4.0* 4.0* 4.3* 4.9*    CBC: Recent Labs  Lab 05/27/18 0500 05/28/18 0748 05/29/18 0732 05/30/18 0500 05/31/18 0600 06/01/18 0421  WBC 10.3 9.4 8.2 10.1 9.6  12.3*  NEUTROABS 7.6 6.7  --  8.1* 7.9* 10.4*  HGB 15.1 14.6 14.6 13.5 13.0 12.8*  HCT 47.8 46.0 45.4 42.9 40.2 40.7  MCV 90.4 89.5 89.7 89.6 89.5 89.3  PLT 239 213 213 242 222 206   Cardiac Enzymes: No results for input(s): CKTOTAL, CKMB, CKMBINDEX, TROPONINI in the last 168 hours. BNP (last 3 results) No results for input(s): PROBNP in the last 8760 hours. CBG: No results for input(s): GLUCAP in the last 168 hours. D-Dimer: Recent Labs    05/31/18 0600 06/01/18 0421  DDIMER 0.58* 0.39   Hgb A1c: No results for input(s): HGBA1C in the last 72 hours. Lipid Profile: No results for input(s): CHOL, HDL, LDLCALC, TRIG, CHOLHDL, LDLDIRECT in the last 72 hours. Thyroid function studies: No results for input(s): TSH, T4TOTAL, T3FREE, THYROIDAB in the last 72 hours.  Invalid input(s): FREET3 Anemia work up: Recent Labs    05/31/18 0600 06/01/18 0421  FERRITIN 66 53   Sepsis Labs: Recent Labs  Lab 05/26/18 0600  05/28/18 0748 05/29/18 0732 05/30/18 0500 05/31/18 0600 06/01/18 0421  PROCALCITON 0.45  --  0.26  --   --   --   --   WBC 9.3   < > 9.4 8.2 10.1 9.6 12.3*   < > = values in this interval not displayed.   Microbiology Recent Results (from the past 240 hour(s))  SARS Coronavirus 2 [facility] order, Performed in [facility] hospital lab)     Status: Abnormal   Collection Time: 04/28/2018 12:10 PM  Result Value Ref Range Status   SARS Coronavirus 2 POSITIVE (A) NEGATIVE Final    Comment:  RESULT CALLED TO, READ BACK BY AND VERIFIED WITH: RN Lowell Bouton [phone] (NOTE) If result is NEGATIVE SARS-CoV-2 target nucleic acids are NOT DETECTED. The SARS-CoV-2 RNA is generally detectable in upper and lower  respiratory specimens during the acute phase of infection. The lowest  concentration of SARS-CoV-2 viral copies this assay can detect is 250  copies / mL. A negative result does not preclude SARS-CoV-2 infection  and should not be used as the sole basis for treatment or other  patient management decisions.  A negative result may occur with  improper specimen collection / handling, submission of specimen other  than nasopharyngeal swab, presence of viral mutation(s) within the  areas targeted by this assay, and inadequate number of viral copies  (<250 copies / mL). A negative result must be combined with clinical  observations, patient history, and epidemiological information. If result is POSITIVE SARS-CoV-2 target nucleic acids are DETECTED. The SARS-Co V-2 RNA is generally detectable in upper and lower  respiratory specimens during the acute phase of infection.  Positive  results are indicative of active infection with SARS-CoV-2.  Clinical  correlation with patient history and other diagnostic information is  necessary to determine patient infection status.  Positive results do  not rule out bacterial infection or co-infection with other viruses. If result is PRESUMPTIVE POSTIVE SARS-CoV-2 nucleic acids MAY BE PRESENT.   A presumptive positive result was obtained on the submitted specimen  and confirmed on repeat testing.  While 2019 novel coronavirus  (SARS-CoV-2) nucleic acids may be present in the submitted sample  additional confirmatory testing may be necessary for epidemiological  and / or clinical management purposes  to differentiate between  SARS-CoV-2 and other Sarbecovirus currently known to infect humans.  If clinically indicated additional testing with  an alternate test  methodology [phone]) is advised.  The SARS-CoV-2 RNA is generally  detectable in upper and lower respiratory specimens during the acute  phase of infection. The expected result is Negative. Fact Sheet for Patients:  StrictlyIdeas.no Fact Sheet for Healthcare Providers: BankingDealers.co.za This test is not yet approved or cleared by the [place] FDA and has been authorized for detection and/or diagnosis of SARS-CoV-2 by FDA under an Emergency Use Authorization (EUA).  This EUA will remain in effect (meaning this test can be used) for the duration of the COVID-19 declaration under Section 564(b)(1) of the Act, 21 U.S.C. section 360bbb-3(b)(1), unless the authorization is terminated or revoked sooner. Performed at [facility], [place] [address], [place], [place] [address]   Blood Culture (routine x 2)     Status: None   Collection Time: 04/28/2018 12:16 PM  Result Value Ref Range Status   Specimen Description BLOOD RIGHT ANTECUBITAL  Final   Special Requests   Final    BOTTLES DRAWN AEROBIC AND ANAEROBIC Blood Culture adequate volume   Culture   Final    NO GROWTH 5 DAYS Performed at [facility], [place] [address] [place], [place] [address]    Report Status 05/27/2018 FINAL  Final  Blood Culture (routine x 2)     Status: None   Collection Time: 05/04/2018  6:42 PM  Result Value Ref Range Status   Specimen Description BLOOD LEFT ANTECUBITAL  Final   Special Requests   Final    BOTTLES DRAWN AEROBIC ONLY Blood Culture results may not be optimal due to an inadequate volume of blood received in culture bottles   Culture   Final    NO GROWTH 5 DAYS Performed at [facility], [place] [address], [place], [place] [address]    Report Status 05/27/2018 FINAL  Final  MRSA PCR Screening     Status: None   Collection Time: 05/23/18 10:45 AM  Result Value Ref Range Status   MRSA by PCR NEGATIVE NEGATIVE Final     Comment:        The GeneXpert MRSA Assay (FDA approved for NASAL specimens only), is one component of a comprehensive MRSA colonization surveillance program. It is not intended to diagnose MRSA infection nor to guide or monitor treatment for MRSA infections. Performed at [facility], [place] [address] [place], [place] [address]   C Difficile Quick Screen w PCR reflex     Status: None   Collection Time: 05/24/18  4:52 PM  Result Value Ref Range Status   C Diff antigen NEGATIVE NEGATIVE Final   C Diff toxin NEGATIVE NEGATIVE Final   C Diff interpretation No C. difficile detected.  Final    Comment: Performed at [facility], [place] [address] [place], [place] [address]  MRSA PCR Screening     Status: None   Collection Time: 05/31/18  7:27 AM  Result Value Ref Range Status   MRSA by PCR NEGATIVE NEGATIVE Final    Comment:        The GeneXpert MRSA Assay (FDA approved for NASAL specimens only), is one component of a comprehensive MRSA colonization surveillance program. It is not intended to diagnose MRSA infection nor to guide or monitor treatment for MRSA infections. Performed at [facility], [place] [address], [place], [place] [address]      Medications:   . amLODipine  5 mg Oral Daily  . carvedilol  6.25 mg Oral BID WC  . chlorhexidine  15 mL Mouth  Rinse BID  . cholecalciferol  1,000 Units Oral Daily  . divalproex  125 mg Oral TID  . feeding supplement (ENSURE ENLIVE)  237 mL Oral TID BM  . hydrocortisone sod succinate (SOLU-CORTEF) inj  100 mg Intravenous Q8H  . mouth rinse  15 mL Mouth Rinse q12n4p  . multivitamin with minerals  1 tablet Oral Daily  . psyllium  1 packet Oral Daily  . sodium chloride flush  3 mL Intravenous Q12H  . Warfarin - Pharmacist Dosing Inpatient   Does not apply q1800  . zinc sulfate  220 mg Oral Daily   Continuous Infusions: . sodium chloride 1,000 mL (05/31/18 0808)  . ampicillin-sulbactam  (UNASYN) IV 3 g (06/01/18 0846)  . potassium chloride 10 mEq (06/01/18 1035)      LOS: 10 days   [facility]  06/01/2018, 11:05 AM

## 2018-06-01 NOTE — Progress Notes (Addendum)
Occupational Therapy Treatment Patient Details Name: [name] MRN: [id number] DOB: 02-21-1940 Today's Date: 06/01/2018    History of present illness 78 yo male presenting from SNF with AMS. Tested COVID positive. PMH including CHF, h/o GI bleeding, Addison's disease, dementia, anemia chronic disease, and pulmonary embolus.    OT comments  Pt continues to have decreased active movement in bil UEs and requiring increased assist for ADL completion. Assisted with self-feeding task; pt requiring overall totalA to perform including close supervision and cues for swallowing. Attempted hand over hand with task, pt having difficulty grasping utensil as well as bringing hand to mouth. When given the option of two food choices from tray pt intermittently able to verbalize choice of food. Performed additional PROM/stretching to bil UEs within available range. Pt with increased tone in bil UEs (also question if partly due to some resistance to certain movements?). Still awaiting resting hand splints - per chart review RN contacted ortho tech regarding delivery. Overall VSS throughout session. Given pt with minimal progress have downgraded OT goals. Will continue to follow.     Follow Up Recommendations  SNF;Supervision/Assistance - 24 hour    Equipment Recommendations  None recommended by OT    Recommendations for Other Services      Precautions / Restrictions Precautions Precautions: Fall Precaution Comments: significant tone all 4 extremities Restrictions Weight Bearing Restrictions: No       Mobility Bed Mobility Overal bed mobility: Needs Assistance             General bed mobility comments: totalA to reposition, pt with HOB elevated to upright position for self-feeding  Transfers                      Balance                                           ADL either performed or assessed with clinical judgement   ADL Overall ADL's : Needs  assistance/impaired   Eating/Feeding Details (indicate cue type and reason): max-total hand over hand assist to perform basic task; pt with increased difficulty grasping silverwear requiring manual facilitation to do so                                   General ADL Comments: overall continues to require totalA for ADL     Vision       Perception     Praxis      Cognition Arousal/Alertness: Awake/alert Behavior During Therapy: Flat affect Overall Cognitive Status: History of cognitive impairments - at baseline                         Following Commands: Follows one step commands inconsistently     Problem Solving: Slow processing;Decreased initiation General Comments: providing one word responses intermittently        Exercises Exercises: General Upper Extremity General Exercises - Upper Extremity Shoulder Flexion: PROM;Both;10 reps(grossly to 90*) Elbow Flexion: PROM;Both;10 reps Elbow Extension: PROM;Both;10 reps(decreased extension in LUE) Wrist Flexion: PROM;Both;10 reps Wrist Extension: PROM;Both;10 reps Digit Composite Flexion: PROM;Both;10 reps Composite Extension: PROM;Both;10 reps(decreased in R hand)   Shoulder Instructions       General Comments      Pertinent Vitals/ Pain  Pain Assessment: Faces Faces Pain Scale: Hurts little more Pain Location: with ROM Pain Descriptors / Indicators: Grimacing  Home Living                                          Prior Functioning/Environment              Frequency  Min 2X/week        Progress Toward Goals  OT Goals(current goals can now be found in the care plan section)  Progress towards OT goals: Goals drowngraded-see care plan  Acute Rehab OT Goals Patient Stated Goal: Unstated by pt. wife wanting him safe and return to rehab OT Goal Formulation: With patient/family Time For Goal Achievement: 06/06/18 Potential to Achieve Goals: Good  Plan  Discharge plan remains appropriate    Co-evaluation                 AM-PAC OT "6 Clicks" Daily Activity     Outcome Measure   Help from another person eating meals?: A Lot Help from another person taking care of personal grooming?: A Lot Help from another person toileting, which includes using toliet, bedpan, or urinal?: Total Help from another person bathing (including washing, rinsing, drying)?: Total Help from another person to put on and taking off regular upper body clothing?: Total Help from another person to put on and taking off regular lower body clothing?: Total 6 Click Score: 8    End of Session Equipment Utilized During Treatment: Oxygen  OT Visit Diagnosis: Unsteadiness on feet (R26.81);Other abnormalities of gait and mobility (R26.89);Muscle weakness (generalized) (M62.81);Other symptoms and signs involving cognitive function   Activity Tolerance Patient tolerated treatment well   Patient Left in bed;with call bell/phone within reach;with bed alarm set   Nurse Communication Mobility status        Time: 1335-1416 OT Time Calculation (min): 41 min  Charges: OT General Charges $OT Visit: 1 Visit OT Treatments $Self Care/Home Management : 8-22 mins $Therapeutic Activity: 23-37 mins  Lou Cal, OT [facility] Pager [phone] Office [phone]    Raymondo Band 06/01/2018, 4:11 PM

## 2018-06-01 NOTE — Progress Notes (Signed)
  Speech Language Pathology Treatment: Dysphagia  Patient Details Name: [name] MRN: [id number] DOB: Jun 05, 1940 Today's Date: 06/01/2018 Time: 1022-1039 SLP Time Calculation (min) (ACUTE ONLY): 17 min  Assessment / Plan / Recommendation Clinical Impression  Pt is more alert and responsive today. He responded to 90% of yes/no questions, stated that he had four children and was from [place].  He demonstrated no prolonged oral holding when consuming nectar-thickened Ensure.  Pt held cup in right hand and brought straw to lips with hand-over-hand assist.  There were no s/s of aspiration; pt did not respond to cues to slow down, clinician sealed off straw after two sips to maintain safety.  RR remained in 9s.  Provided physical assist to complete oral care; pt asserted "stop it" with attempt to suction.  Overall toleration of POs better today, attributable to slightly improved mentation.  Continue dysphagia 1, nectar liquids as safest diet.  SLP will follow for safety/diet progression as appropriate.   HPI HPI: [name] is an 78 y.o. male with past medical history of diastolic heart failure, history of GI bleed, dementia, anemia, PE on Coumadin found to be lethargic 4/27, brought to ED from [facility].  Code stroke was called on admission, TPA was not given; he was found to be febrile ,hypotensive, tachycardic, and hypoxic in the ED. CT of the head negative for stroke, CT Angie of the head and neck showed no significant stenosis. Patient was found to have COVID-19 positive PCR. Now with generalized decline; minimal PO intake.        SLP Plan  Continue with current plan of care       Recommendations  Diet recommendations: Dysphagia 1 (puree);Nectar-thick liquid Liquids provided via: Cup;Straw Medication Administration: Whole meds with puree Supervision: Full supervision/cueing for compensatory strategies Compensations: Minimize environmental distractions;Slow rate;Small  sips/bites Postural Changes and/or Swallow Maneuvers: Seated upright 90 degrees                Oral Care Recommendations: Oral care BID Follow up Recommendations: Skilled Nursing facility SLP Visit Diagnosis: Dysphagia, unspecified (R13.10) Plan: Continue with current plan of care       GO                [name] 06/01/2018, 10:42 AM  Estill Bamberg L. Tivis Ringer, [place] Office number [phone] MBT DHRCBU: [phone]

## 2018-06-02 DIAGNOSIS — F321 Major depressive disorder, single episode, moderate: Secondary | ICD-10-CM

## 2018-06-02 LAB — CBC WITH DIFFERENTIAL/PLATELET
Abs Immature Granulocytes: 0.25 10*3/uL — ABNORMAL HIGH (ref 0.00–0.07)
Basophils Absolute: 0 10*3/uL (ref 0.0–0.1)
Basophils Relative: 0 %
Eosinophils Absolute: 0 10*3/uL (ref 0.0–0.5)
Eosinophils Relative: 0 %
HCT: 38.5 % — ABNORMAL LOW (ref 39.0–52.0)
Hemoglobin: 12.4 g/dL — ABNORMAL LOW (ref 13.0–17.0)
Immature Granulocytes: 2 %
Lymphocytes Relative: 5 %
Lymphs Abs: 0.7 10*3/uL (ref 0.7–4.0)
MCH: 28.8 pg (ref 26.0–34.0)
MCHC: 32.2 g/dL (ref 30.0–36.0)
MCV: 89.5 fL (ref 80.0–100.0)
Monocytes Absolute: 0.6 10*3/uL (ref 0.1–1.0)
Monocytes Relative: 4 %
Neutro Abs: 13 10*3/uL — ABNORMAL HIGH (ref 1.7–7.7)
Neutrophils Relative %: 89 %
Platelets: 186 10*3/uL (ref 150–400)
RBC: 4.3 MIL/uL (ref 4.22–5.81)
RDW: 16.8 % — ABNORMAL HIGH (ref 11.5–15.5)
WBC: 14.6 10*3/uL — ABNORMAL HIGH (ref 4.0–10.5)
nRBC: 0 % (ref 0.0–0.2)

## 2018-06-02 LAB — C-REACTIVE PROTEIN: CRP: 16.9 mg/dL — ABNORMAL HIGH (ref <1.0)

## 2018-06-02 LAB — PROTIME-INR
INR: 3.2 — ABNORMAL HIGH (ref 0.8–1.2)
Prothrombin Time: 32.5 seconds — ABNORMAL HIGH (ref 11.4–15.2)

## 2018-06-02 LAB — COMPREHENSIVE METABOLIC PANEL
ALT: 22 U/L (ref 0–44)
AST: 22 U/L (ref 15–41)
Albumin: 2.2 g/dL — ABNORMAL LOW (ref 3.5–5.0)
Alkaline Phosphatase: 60 U/L (ref 38–126)
Anion gap: 9 (ref 5–15)
BUN: 21 mg/dL (ref 8–23)
CO2: 20 mmol/L — ABNORMAL LOW (ref 22–32)
Calcium: 7.7 mg/dL — ABNORMAL LOW (ref 8.9–10.3)
Chloride: 116 mmol/L — ABNORMAL HIGH (ref 98–111)
Creatinine, Ser: 0.91 mg/dL (ref 0.61–1.24)
GFR calc Af Amer: >60 mL/min (ref >60)
GFR calc non Af Amer: >60 mL/min (ref >60)
Glucose, Bld: 145 mg/dL — ABNORMAL HIGH (ref 70–99)
Potassium: 3.6 mmol/L (ref 3.5–5.1)
Sodium: 145 mmol/L (ref 135–145)
Total Bilirubin: 0.6 mg/dL (ref 0.3–1.2)
Total Protein: 5.8 g/dL — ABNORMAL LOW (ref 6.5–8.1)

## 2018-06-02 LAB — D-DIMER, QUANTITATIVE: D-Dimer, Quant: 0.83 ug/mL-FEU — ABNORMAL HIGH (ref 0.00–0.50)

## 2018-06-02 LAB — FERRITIN: Ferritin: 326 ng/mL (ref 24–336)

## 2018-06-02 LAB — MAGNESIUM: Magnesium: 2.1 mg/dL (ref 1.7–2.4)

## 2018-06-02 MED ORDER — TRAZODONE HCL 50 MG PO TABS
75.0000 mg | ORAL_TABLET | Freq: Every day | ORAL | Status: DC
  Administered 2018-06-02: 75 mg via ORAL
  Filled 2018-06-02: qty 2

## 2018-06-02 MED ORDER — HYDROCORTISONE NA SUCCINATE PF 100 MG IJ SOLR
50.0000 mg | Freq: Three times a day (TID) | INTRAMUSCULAR | Status: DC
  Administered 2018-06-02 – 2018-06-04 (×5): 50 mg via INTRAVENOUS
  Filled 2018-06-02 (×6): qty 2

## 2018-06-02 MED ORDER — THYROID 60 MG PO TABS
60.0000 mg | ORAL_TABLET | Freq: Every day | ORAL | Status: DC
  Administered 2018-06-02 – 2018-06-04 (×2): 60 mg via ORAL
  Filled 2018-06-02 (×4): qty 1

## 2018-06-02 MED ORDER — TRIHEXYPHENIDYL HCL 2 MG PO TABS
2.0000 mg | ORAL_TABLET | Freq: Every day | ORAL | Status: DC
  Administered 2018-06-02 – 2018-06-04 (×2): 2 mg via ORAL
  Filled 2018-06-02 (×4): qty 1

## 2018-06-02 MED ORDER — ESCITALOPRAM OXALATE 10 MG PO TABS
10.0000 mg | ORAL_TABLET | Freq: Every day | ORAL | Status: DC
  Administered 2018-06-02 – 2018-06-05 (×4): 10 mg via ORAL
  Filled 2018-06-02 (×6): qty 1

## 2018-06-02 MED ORDER — GABAPENTIN 300 MG PO CAPS
300.0000 mg | ORAL_CAPSULE | Freq: Three times a day (TID) | ORAL | Status: DC
  Administered 2018-06-02 – 2018-06-05 (×6): 300 mg via ORAL
  Filled 2018-06-02 (×6): qty 1

## 2018-06-02 NOTE — Progress Notes (Signed)
PROGRESS NOTE  [name]  HYQ:[id number] DOB: 08/03/1940 DOA: 05/08/2018 PCP: Lawerance Cruel, MD   Brief Narrative: [name] is a 78 y.o. male with a history of chronic HFpEF, dementia, PE on coumadin, GI bleeding and anemia of chronic disease who presented from his facility with lethargy. He was febrile, hypotensive, tachycardic and hypoxic. Code stroke was called, though CT  and CTA of the head showed no stroke or LVO. Covid testing was positive and he was transferred to [name]. He was given broad spectrum antibiotics. Hospitalization has been protracted due to severity of illness and thus complicated by deconditioning, delirium on dementia, and now dysphagia and aspiration pneumonia.  Assessment & Plan: Principal Problem:   COVID-19 Active Problems:   Hypothyroidism   Addison disease ([place])   History of pulmonary embolism   Chronic diastolic CHF (congestive heart failure) (HCC)   Transient hypotension   SIRS (systemic inflammatory response syndrome) (HCC)   Diabetes mellitus without complication (HCC)   Dementia (HCC)   Right sided weakness  Acute hypoxic respiratory failure: Due to covid-19 infection and now aspiration pneumonitis and pneumonia:  - Continue supplemental oxygen as needed to maintain SpO2 >90%.  Sepsis due to covid-19 infection:  - Remains positive on 5/7 (initial positive 4/27. Will need negative test for disposition.  - Avoid NSAIDs - Continue airborne/contact precautions.  Dysphagia and aspiration pneumonia:  - Dysphagia diet with nectar-thickened liquids, continue SLP evaluation and assistance. PO intake remains poor.  - Continue unasyn (5/6 >> ). CXR demonstrating right perihilar and left medial base opacities consistent with pneumonia.   Acute metabolic encephalopathy on chronic dementia with behavioral disturbance:  - Continue home depakote, restart home lexapro, trazodone - Will continue artane, will not restart fluphenazine as he is not  currently experiencing restless delirium.    Hx PE and LLE DVT: INR supratherapeutic, - Continue monitoring and restart coumadin when necessary.   Supratherapeutic INR: Despite holding coumadin, suspect due to very limited po intake.  - Monitor as above  CAD s/p PCI and chronic HFpEF: LVEF 55% in 2019. Has been diuresed and currently appears euvolemic. BNP 30.6. - Continue BP management with norvasc and coreg, prn hydralazine also available.  - Continue to hold home lasix MWF.  Hypokalemia:  - Replaced, improved, continue monitoring.  T2DM: HbA1c 5.8%. Lantus is on home med list, though has been at inpatient goal without insulin.  - Continue monitoring with daily labs.   Peripheral neuropathy:  - Restart home gabapentin at 300mg  TID (600mg  home dose)  Adrenal insufficiency: Stress-dose steroids started 5/3. - Taper stress steroids today, down to 50mg  IV TID. Home po cortef dosing was 10mg  / 20mg  / 20mg   Stage III CKD: At baseline.  - Avoid nephrotoxins  Nonanion gap metabolic acidosis: Due to diarrhea which has resolved. Flexiseal removed.   Stage II pressure injury of the right lateral hip, POA: Improving from when it was initially noted in Dec 2019.  - Continue offloading as able, optimize nutritional status.   Stage I pressure injury of sacrum:  - Foam padding, offload as able.   Hypothyroidism:  - Continue thyroid supplement  DVT prophylaxis: Coumadin Code Status: DNR Family Communication: Wife by phone for 16 minutes today. Disposition Plan: Uncertain, guarded prognosis.   Consultants:   None  Procedures:   None  Antimicrobials:  Vancomycin, cefepime, flagyl 4/27  Vancomyin, cefepime, azithromycin 4/28 - 5/1  Unasyn 5/6 >>   Subjective: No bleeding, worsening leg swelling, or diarrhea. Has  urinated in the bed. Poorly responsive, with verbal and tactile stimuli. When rolling sider to side does open eyes and shakes heaqd no appropriately x1, otherwise  not significantly responsive. RN assisted withy breakfast which the patient continued to have coughing after each bite, would not take more than just a few bites this morning.  Objective: Vitals:   06/02/18 0434 06/02/18 0800 06/02/18 0836 06/02/18 0838  BP:  (!) 169/101 (!) 151/92   Pulse:  (!) 104 (!) 107   Resp:  17 18   Temp:    98.6 F (37 C)  TempSrc:    Axillary  SpO2:  93% 92%   Weight: 82.3 kg     Height:        Intake/Output Summary (Last 24 hours) at 06/02/2018 1047 Last data filed at 06/02/2018 1000 Gross per 24 hour  Intake 1085.31 ml  Output 450 ml  Net 635.31 ml   Filed Weights   05/31/18 0422 06/01/18 0614 06/02/18 0434  Weight: 85.1 kg 83.3 kg 82.3 kg    Gen: Acutelly ill-appearing male Pulm: Non-labored breathing. Clear to auscultation bilaterally.  CV: Regular tachycardia. No murmur, rub, or gallop. No JVD GI: Abdomen soft, non-tender, non-distended, with normoactive bowel sounds. No organomegaly or masses felt. Ext: Warm, no deformities. +Left leg swelling. Skin: Right hip with shallow erythematous area and central inverted punctum. Inferior sacrum with moist purple discoloration without open ulceration. No exudate or spreading erythema. Neuro: Alert, nonverbal. Some spasm, no clonus, towel rolls in hands,  Psych: Judgement and insight appear impaired.  Data Reviewed: I have personally reviewed following labs and imaging studies  CBC: Recent Labs  Lab 05/28/18 0748 05/29/18 0732 05/30/18 0500 05/31/18 0600 06/01/18 0421 06/02/18 0447  WBC 9.4 8.2 10.1 9.6 12.3* 14.6*  NEUTROABS 6.7  --  8.1* 7.9* 10.4* 13.0*  HGB 14.6 14.6 13.5 13.0 12.8* 12.4*  HCT 46.0 45.4 42.9 40.2 40.7 38.5*  MCV 89.5 89.7 89.6 89.5 89.3 89.5  PLT 213 213 242 222 206 195   Basic Metabolic Panel: Recent Labs  Lab 05/29/18 0732 05/30/18 0500 05/31/18 0600 06/01/18 0421 06/02/18 0447  NA 140 142 140 144 145  K 3.2* 3.5 3.4* 3.2* 3.6  CL 115* 114* 114* 116* 116*  CO2  17* 18* 18* 21* 20*  GLUCOSE 127* 127* 132* 106* 145*  BUN 22 24* 21 19 21   CREATININE 1.22 1.07 1.08 0.93 0.91  CALCIUM 7.8* 7.9* 7.7* 7.7* 7.7*  MG 2.1 2.0 2.0 2.0 2.1  PHOS 2.7  --   --   --   --    GFR: Estimated Creatinine Clearance: 70.2 mL/min (by C-G formula based on SCr of 0.91 mg/dL). Liver Function Tests: Recent Labs  Lab 05/28/18 0455 05/30/18 0500 05/31/18 0600 06/01/18 0421 06/02/18 0447  AST 29 28 25 21 22   ALT 17 22 23 23 22   ALKPHOS 49 51 51 51 60  BILITOT 0.5 0.5 0.7 0.6 0.6  PROT 5.0* 6.0* 5.9* 5.7* 5.8*  ALBUMIN 2.1* 2.3* 2.4* 2.3* 2.2*   No results for input(s): LIPASE, AMYLASE in the last 168 hours. No results for input(s): AMMONIA in the last 168 hours. Coagulation Profile: Recent Labs  Lab 05/29/18 0501 05/30/18 0500 05/31/18 0600 06/01/18 0421 06/02/18 0447  INR 4.0* 4.0* 4.3* 4.9* 3.2*   Cardiac Enzymes: No results for input(s): CKTOTAL, CKMB, CKMBINDEX, TROPONINI in the last 168 hours. BNP (last 3 results) No results for input(s): PROBNP in the last 8760 hours. HbA1C: No results  for input(s): HGBA1C in the last 72 hours. CBG: No results for input(s): GLUCAP in the last 168 hours. Lipid Profile: No results for input(s): CHOL, HDL, LDLCALC, TRIG, CHOLHDL, LDLDIRECT in the last 72 hours. Thyroid Function Tests: No results for input(s): TSH, T4TOTAL, FREET4, T3FREE, THYROIDAB in the last 72 hours. Anemia Panel: Recent Labs    06/01/18 0421 06/02/18 0447  FERRITIN 53 326   Urine analysis:    Component Value Date/Time   COLORURINE YELLOW 05/24/2018 0500   APPEARANCEUR CLEAR 05/24/2018 0500   LABSPEC 1.018 05/24/2018 0500   PHURINE 6.0 05/24/2018 0500   GLUCOSEU NEGATIVE 05/24/2018 0500   HGBUR NEGATIVE 05/24/2018 0500   BILIRUBINUR NEGATIVE 05/24/2018 0500   KETONESUR 20 (A) 05/24/2018 0500   PROTEINUR NEGATIVE 05/24/2018 0500   UROBILINOGEN 0.2 03/21/2007 1015   NITRITE NEGATIVE 05/24/2018 0500   LEUKOCYTESUR NEGATIVE  05/24/2018 0500   Recent Results (from the past 240 hour(s))  C Difficile Quick Screen w PCR reflex     Status: None   Collection Time: 05/24/18  4:52 PM  Result Value Ref Range Status   C Diff antigen NEGATIVE NEGATIVE Final   C Diff toxin NEGATIVE NEGATIVE Final   C Diff interpretation No C. difficile detected.  Final    Comment: Performed at [facility], [place] [address] [place], [place] [address]  MRSA PCR Screening     Status: None   Collection Time: 05/31/18  7:27 AM  Result Value Ref Range Status   MRSA by PCR NEGATIVE NEGATIVE Final    Comment:        The GeneXpert MRSA Assay (FDA approved for NASAL specimens only), is one component of a comprehensive MRSA colonization surveillance program. It is not intended to diagnose MRSA infection nor to guide or monitor treatment for MRSA infections. Performed at [facility], [place] [address], [place], [place] [address]   SARS Coronavirus 2 (CEPHEID- Performed in [facility] lab), Hosp Order     Status: Abnormal   Collection Time: 06/01/18 11:15 AM  Result Value Ref Range Status   SARS Coronavirus 2 POSITIVE (A) NEGATIVE Final    Comment: RESULT CALLED TO, READ BACK BY AND VERIFIED WITH: CURRY,E. RN @1448  ON 05.07.2020 BY COHEN,K (NOTE) SARS CoV 2 target nucleic acids are DETECTED. The SARS CoV 2 RNA is generally detectable in upper and lower respiratory specimens during the acute phase of infection. Positive results are indicative of active infection with SARS CoV 2. Clinical  correlation with patient history and other diagnostic information is necessary to determine patient infection status. Positive results do  not rule out bacterial infection or coinfection with other viruses. The expected result is Negative. Fact Sheet for Patients:  https [employer] media 341962 download Fact Sheet for Healthcare Providers:  https [employer] media (810) 849-1618 download This test is not yet  approved or cleared by the [place] and  has been authorized for detection and/or diagnosis of SARS CoV 2 by FDA under an Emergency Use Authorization (EUA).  This EUA will remain in effect (meaning this test can be used) fo r the duration of  the COVID19 declaration under Section 564(b)(1) of the Act, 21 U.S.C.  section 360bbb-3(b)(1), unless the authorization is terminated or revoked sooner. Performed at [facility], [place] [address] [place], [place] [address]       Radiology Studies: No results found.  Scheduled Meds: . amLODipine  5 mg Oral Daily  . carvedilol  6.25  mg Oral BID WC  . chlorhexidine  15 mL Mouth Rinse BID  . cholecalciferol  1,000 Units Oral Daily  . divalproex  125 mg Oral TID  . feeding supplement (ENSURE ENLIVE)  237 mL Oral TID BM  . hydrocortisone sod succinate (SOLU-CORTEF) inj  100 mg Intravenous Q8H  . mouth rinse  15 mL Mouth Rinse q12n4p  . multivitamin with minerals  1 tablet Oral Daily  . psyllium  1 packet Oral Daily  . sodium chloride flush  3 mL Intravenous Q12H  . Warfarin - Pharmacist Dosing Inpatient   Does not apply q1800  . zinc sulfate  220 mg Oral Daily   Continuous Infusions: . sodium chloride 10 mL/hr at 06/02/18 1000  . ampicillin-sulbactam (UNASYN) IV Stopped (06/02/18 0905)     LOS: 11 days   Time spent: 35 minutes.  Patrecia Pour, MD [facility] [email or web address] Password [facility] 06/02/2018, 10:47 AM

## 2018-06-02 NOTE — Progress Notes (Signed)
Physical Therapy Treatment Patient Details Name: [name] MRN: [id number] DOB: 07-06-1940 Today's Date: 06/02/2018    History of Present Illness 78 yo male presenting from SNF with AMS. Tested COVID positive. PMH including CHF, h/o GI bleeding, Addison's disease, dementia, anemia chronic disease, and pulmonary embolus.     PT Comments    Pt with very little arousal. Eyes closed throughout. Worked on positioning and range of motion with little response.   Follow Up Recommendations  SNF;Supervision/Assistance - 24 hour     Equipment Recommendations  None recommended by PT    Recommendations for Other Services       Precautions / Restrictions Precautions Precautions: Fall Precaution Comments: significant tone all 4 extremities Restrictions Weight Bearing Restrictions: No    Mobility  Bed Mobility                  Transfers                    Ambulation/Gait                 Stairs             Wheelchair Mobility    Modified Rankin (Stroke Patients Only)       Balance                                            Cognition Arousal/Alertness: Lethargic Behavior During Therapy: Flat affect Overall Cognitive Status: History of cognitive impairments - at baseline                                 General Comments: Pt with eyes closed and minimal arousal throughout session      Exercises General Exercises - Lower Extremity Ankle Circles/Pumps: PROM;Both;10 reps;Supine Heel Slides: PROM;Both;10 reps(very little knee flexion on rt) Hip ABduction/ADduction: PROM;Both;10 reps Other Exercises Other Exercises: LE hip internal/external rotation bil x 10 reps PROM    General Comments        Pertinent Vitals/Pain Pain Assessment: Faces Faces Pain Scale: No hurt    Home Living                      Prior Function            PT Goals (current goals can now be found in the care plan  section) Acute Rehab PT Goals PT Goal Formulation: Patient unable to participate in goal setting Time For Goal Achievement: 06/16/18 Potential to Achieve Goals: Fair Progress towards PT goals: Goals downgraded-see care plan    Frequency    Min 2X/week      PT Plan Current plan remains appropriate    Co-evaluation              AM-PAC PT "6 Clicks" Mobility   Outcome Measure  Help needed turning from your back to your side while in a flat bed without using bedrails?: Total Help needed moving from lying on your back to sitting on the side of a flat bed without using bedrails?: Total Help needed moving to and from a bed to a chair (including a wheelchair)?: Total Help needed standing up from a chair using your arms (e.g., wheelchair or bedside chair)?: Total Help needed to walk in hospital room?: Total Help needed climbing 3-5  steps with a railing? : Total 6 Click Score: 6    End of Session Equipment Utilized During Treatment: Oxygen Activity Tolerance: Patient tolerated treatment well Patient left: in bed;with call bell/phone within reach Nurse Communication: Other (comment)(condom cath off) PT Visit Diagnosis: Muscle weakness (generalized) (M62.81)     Time: 2763-9432 PT Time Calculation (min) (ACUTE ONLY): 11 min  Charges:  $Therapeutic Exercise: 8-22 mins                     [place] Pager [phone] Office [place] 06/02/2018, 9:46 AM

## 2018-06-02 NOTE — Plan of Care (Signed)
  Problem: Education: Goal: Knowledge of General Education information will improve Description Including pain rating scale, medication(s)/side effects and non-pharmacologic comfort measures Outcome: Not Progressing   Problem: Health Behavior/Discharge Planning: Goal: Ability to manage health-related needs will improve Outcome: Not Progressing   Problem: Nutrition: Goal: Adequate nutrition will be maintained Outcome: Not Progressing   Problem: Education: Goal: Knowledge of disease or condition will improve 06/02/2018 1905 by Phyllis Ginger, RN Outcome: Not Progressing 06/02/2018 1903 by Phyllis Ginger, RN Outcome: Not Progressing Goal: Knowledge of secondary prevention will improve 06/02/2018 1905 by Phyllis Ginger, RN Outcome: Not Progressing 06/02/2018 1903 by Phyllis Ginger, RN Outcome: Not Progressing Goal: Knowledge of patient specific risk factors addressed and post discharge goals established will improve Outcome: Not Progressing

## 2018-06-02 NOTE — Plan of Care (Signed)
  Problem: Clinical Measurements: Goal: Ability to maintain clinical measurements within normal limits will improve Outcome: Progressing Goal: Diagnostic test results will improve Outcome: Progressing Goal: Respiratory complications will improve Outcome: Progressing   Problem: Pain Managment: Goal: General experience of comfort will improve Outcome: Progressing   Problem: Safety: Goal: Ability to remain free from injury will improve Outcome: Progressing   Problem: Coping: Goal: Psychosocial and spiritual needs will be supported Outcome: Progressing   Problem: Respiratory: Goal: Will maintain a patent airway Outcome: Progressing Goal: Complications related to the disease process, condition or treatment will be avoided or minimized Outcome: Progressing   Problem: Education: Goal: Knowledge of General Education information will improve Description Including pain rating scale, medication(s)/side effects and non-pharmacologic comfort measures Outcome: Not Progressing   Problem: Health Behavior/Discharge Planning: Goal: Ability to manage health-related needs will improve Outcome: Not Progressing   Problem: Nutrition: Goal: Adequate nutrition will be maintained Outcome: Not Progressing

## 2018-06-02 NOTE — Progress Notes (Signed)
ANTICOAGULATION CONSULT NOTE - Follow Up Consult  Pharmacy Consult for Warfarin Indication: Hx PE  Labs: Recent Labs    05/31/18 0600 06/01/18 0421 06/02/18 0447  HGB 13.0 12.8* 12.4*  HCT 40.2 40.7 38.5*  PLT 222 206 186  LABPROT 40.5* 44.9* 32.5*  INR 4.3* 4.9* 3.2*  CREATININE 1.08 0.93 0.91    Estimated Creatinine Clearance: 70.2 mL/min (by C-G formula based on SCr of 0.91 mg/dL).   Medications:  Scheduled:  . amLODipine  5 mg Oral Daily  . carvedilol  6.25 mg Oral BID WC  . chlorhexidine  15 mL Mouth Rinse BID  . cholecalciferol  1,000 Units Oral Daily  . divalproex  125 mg Oral TID  . feeding supplement (ENSURE ENLIVE)  237 mL Oral TID BM  . hydrocortisone sod succinate (SOLU-CORTEF) inj  100 mg Intravenous Q8H  . mouth rinse  15 mL Mouth Rinse q12n4p  . multivitamin with minerals  1 tablet Oral Daily  . psyllium  1 packet Oral Daily  . sodium chloride flush  3 mL Intravenous Q12H  . Warfarin - Pharmacist Dosing Inpatient   Does not apply q1800  . zinc sulfate  220 mg Oral Daily    Assessment: 85 yom on warfarin PTA for PE/DVT history presenting with AMS, sepsis due to COVID positive. CVA work-up initiated. Noted remote hx of GIB, but no active bleed issues documented.  PTA warfarin dose: 5mg  daily except 2.5mg  on Tues/Fri.  Admission INR 2.6.  Today, 06/02/2018:  INR 3.2, supratherapeutic but decreased after holding x7 days.  Elevation likely d/t poor PO intake (eating 0-25% meals), antibiotics.  CBC: Hgb decreased to 12.4, Plt remain WNL  No bleeding or complications reported.  D-dimer 0.83   Goal of Therapy:  INR 2-3 Monitor platelets by anticoagulation protocol: Yes   Plan:  Hold warfarin today. Daily PT/INR. Monitor for signs and symptoms of bleeding.   Gretta Arab PharmD, BCPS 06/02/2018 9:41 AM

## 2018-06-03 LAB — POCT I-STAT 7, (LYTES, BLD GAS, ICA,H+H)
Bicarbonate: 23.6 mmol/L (ref 20.0–28.0)
Calcium, Ion: 1.15 mmol/L (ref 1.15–1.40)
HCT: 32 % — ABNORMAL LOW (ref 39.0–52.0)
Hemoglobin: 10.9 g/dL — ABNORMAL LOW (ref 13.0–17.0)
O2 Saturation: 89 %
Patient temperature: 98.6
Potassium: 2.8 mmol/L — ABNORMAL LOW (ref 3.5–5.1)
Sodium: 153 mmol/L — ABNORMAL HIGH (ref 135–145)
TCO2: 25 mmol/L (ref 22–32)
pCO2 arterial: 32.7 mmHg (ref 32.0–48.0)
pH, Arterial: 7.466 — ABNORMAL HIGH (ref 7.350–7.450)
pO2, Arterial: 53 mmHg — ABNORMAL LOW (ref 83.0–108.0)

## 2018-06-03 LAB — PROTIME-INR
INR: 2.6 — ABNORMAL HIGH (ref 0.8–1.2)
Prothrombin Time: 27.3 seconds — ABNORMAL HIGH (ref 11.4–15.2)

## 2018-06-03 LAB — CBC WITH DIFFERENTIAL/PLATELET
Abs Immature Granulocytes: 0.07 10*3/uL (ref 0.00–0.07)
Basophils Absolute: 0 10*3/uL (ref 0.0–0.1)
Basophils Relative: 0 %
Eosinophils Absolute: 0 10*3/uL (ref 0.0–0.5)
Eosinophils Relative: 0 %
HCT: 38.7 % — ABNORMAL LOW (ref 39.0–52.0)
Hemoglobin: 12 g/dL — ABNORMAL LOW (ref 13.0–17.0)
Immature Granulocytes: 1 %
Lymphocytes Relative: 12 %
Lymphs Abs: 1.3 10*3/uL (ref 0.7–4.0)
MCH: 28 pg (ref 26.0–34.0)
MCHC: 31 g/dL (ref 30.0–36.0)
MCV: 90.4 fL (ref 80.0–100.0)
Monocytes Absolute: 0.9 10*3/uL (ref 0.1–1.0)
Monocytes Relative: 9 %
Neutro Abs: 8.2 10*3/uL — ABNORMAL HIGH (ref 1.7–7.7)
Neutrophils Relative %: 78 %
Platelets: 243 10*3/uL (ref 150–400)
RBC: 4.28 MIL/uL (ref 4.22–5.81)
RDW: 16.5 % — ABNORMAL HIGH (ref 11.5–15.5)
WBC: 10.5 10*3/uL (ref 4.0–10.5)
nRBC: 0 % (ref 0.0–0.2)

## 2018-06-03 LAB — C-REACTIVE PROTEIN: CRP: 15.9 mg/dL — ABNORMAL HIGH (ref <1.0)

## 2018-06-03 LAB — FERRITIN: Ferritin: 432 ng/mL — ABNORMAL HIGH (ref 24–336)

## 2018-06-03 LAB — PROCALCITONIN: Procalcitonin: <0.1 ng/mL

## 2018-06-03 LAB — D-DIMER, QUANTITATIVE: D-Dimer, Quant: 0.55 ug/mL-FEU — ABNORMAL HIGH (ref 0.00–0.50)

## 2018-06-03 MED ORDER — MORPHINE SULFATE (PF) 2 MG/ML IV SOLN
1.0000 mg | INTRAVENOUS | Status: DC | PRN
  Administered 2018-06-04 (×3): 1 mg via INTRAVENOUS
  Filled 2018-06-03 (×3): qty 1

## 2018-06-03 MED ORDER — FUROSEMIDE 20 MG PO TABS
20.0000 mg | ORAL_TABLET | ORAL | Status: DC
  Administered 2018-06-03: 20 mg via ORAL
  Filled 2018-06-03: qty 1

## 2018-06-03 MED ORDER — WARFARIN SODIUM 2.5 MG PO TABS
2.5000 mg | ORAL_TABLET | Freq: Once | ORAL | Status: AC
  Administered 2018-06-03: 2.5 mg via ORAL
  Filled 2018-06-03: qty 1

## 2018-06-03 NOTE — Plan of Care (Signed)
Patient lying in bed this morning; sleepy but arousable. Lungs congested. Edema generalized with LUE and LLE worse than right. Will continue to monitor.

## 2018-06-03 NOTE — Progress Notes (Signed)
Received call back from Dr. [place] Pink. Notified of patient status and respiratory status. Notified that we placed patient on NRB mask and sats had come up to mid 57s. New orders received to make patient NPO and to order ABG to be drawn in 30 minutes. Request callback with worsening clinical status or any concerns. Will continue to monitor.

## 2018-06-03 NOTE — Progress Notes (Signed)
Pharmacy Antibiotic Note  [name] is a 78 y.o. male admitted on 05/21/2018 with acute respiratory failure, COVID-19 +.  Now with suspected aspiration pneumonia.  Pharmacy has been consulted for Unasyn dosing.  Suspected aspiration PNA.  Day #4 of Unasyn for aspiration PNA. Afebrile, WBC down to wnl. LA 1.9>1.6  Plan: Continue Unasyn 3g IV q6h Monitor clinical picture, renal function F/U LOT  Height: 5\' 10"  (177.8 cm) Weight: 183 lb 10.3 oz (83.3 kg) IBW/kg (Calculated) : 73  Temp (24hrs), Avg:98.7 F (37.1 C), Min:98.3 F (36.8 C), Max:99.1 F (37.3 C)  Recent Labs  Lab 05/29/18 0732 05/30/18 0500 05/31/18 0600 06/01/18 0421 06/02/18 0447 06/03/18 0515  WBC 8.2 10.1 9.6 12.3* 14.6* 10.5  CREATININE 1.22 1.07 1.08 0.93 0.91  --     Estimated Creatinine Clearance: 70.2 mL/min (by C-G formula based on SCr of 0.91 mg/dL).    No Known Allergies  Thank you for allowing pharmacy to be a part of this patient's care.  Elenor Quinones, PharmD, BCPS, BCIDP Clinical Pharmacist 06/03/2018 7:56 AM

## 2018-06-03 NOTE — Progress Notes (Signed)
Patient c/o pain 8/10 for HA. Dr Purohoit verbalized permission to use prn Tylenol.

## 2018-06-03 NOTE — Progress Notes (Signed)
Interim progress note  S: Paged by RN concerning increased work of breathing noted this afternoon. He has been drinking a lot more than previously and is resistant to attempts to slow him down, though no aspiration events was noted. When I evaluated the patient, he does confirm feeling short of breath but no worse than recently. Denies chest pain. Says he's incredibly thirsty, has cups at bedside. He is aware he's at a [facility] but is unsure why and doesn't seem to know anything about coronavirus/covid.  O: Afebrile, NSR on monitor, 98% on 6L. Interactive, alert male in no distress.  Coarse breath sounds bilaterally, worse than previous exam without wheezes. RR 21/min and not at all labored on 6L HFNC RRR w/PVCs, no new murmurs, JVD or new edema Much more alert than previous exams, diffusely very weak but now verbal. Unable to sit up for posterior pulmonary exam.   A/P: Acute respiratory failure due to covid-19 pneumonia with superimposed aspiration pneumonitis and pneumonia. Acute events today likely represent silent aspiration events while drinking and the patients' nonadherence to aspiration precautions. Fortunately, oxygen requirements are not increasing and on my evaluation he actually looks better than previously. Based on my conversation with the patient and previous conversations with his wife, we will continue to allow modified diet, not make NPO and would not escalate care to ICU level were aspiration to continue or he develop respiratory distress.  - CXR in AM - Reinforced aspiration precautions, though also reinforced the need to take adequate nutrition - Labs as ordered.   Vance Gather, MD 06/03/2018 5:15 PM

## 2018-06-03 NOTE — Progress Notes (Signed)
Spoke with MD regarding pt's ABG results and current status. Per his recommendations, will keep pt on NRB tonight and provide morphine prn for increased WOB/respiratory distress. Pt currently lethargic, breathing somewhat labored, RR 20-22 sats mid 90s and appears to be in no distress. Will monitor closely throughout night. Hortencia Conradi RN

## 2018-06-03 NOTE — Progress Notes (Signed)
ANTICOAGULATION CONSULT NOTE - Follow Up Consult  Pharmacy Consult for Warfarin Indication: Hx PE  Labs: Recent Labs    06/01/18 0421 06/02/18 0447 06/03/18 0515  HGB 12.8* 12.4* 12.0*  HCT 40.7 38.5* 38.7*  PLT 206 186 243  LABPROT 44.9* 32.5* 27.3*  INR 4.9* 3.2* 2.6*  CREATININE 0.93 0.91  --     Estimated Creatinine Clearance: 70.2 mL/min (by C-G formula based on SCr of 0.91 mg/dL).   Medications:  Scheduled:  . amLODipine  5 mg Oral Daily  . carvedilol  6.25 mg Oral BID WC  . chlorhexidine  15 mL Mouth Rinse BID  . cholecalciferol  1,000 Units Oral Daily  . divalproex  125 mg Oral TID  . escitalopram  10 mg Oral Daily  . feeding supplement (ENSURE ENLIVE)  237 mL Oral TID BM  . gabapentin  300 mg Oral TID  . hydrocortisone sod succinate (SOLU-CORTEF) inj  50 mg Intravenous Q8H  . mouth rinse  15 mL Mouth Rinse q12n4p  . multivitamin with minerals  1 tablet Oral Daily  . psyllium  1 packet Oral Daily  . sodium chloride flush  3 mL Intravenous Q12H  . thyroid  60 mg Oral Daily  . traZODone  75 mg Oral QHS  . trihexyphenidyl  2 mg Oral Daily  . Warfarin - Pharmacist Dosing Inpatient   Does not apply q1800  . zinc sulfate  220 mg Oral Daily    Assessment: 54 yom on warfarin PTA for PE/DVT history presenting with AMS, sepsis due to COVID positive. CVA work-up initiated. Noted remote hx of GIB, but no active bleed issues documented.  Warfarin PTA (5mg  MWThSaSu, 2.5mg  Tu,F) for hx of PE. INR back down to 2.6 after holding for a week. Probably caused by poor PO intake. Ddimer has been negative. Hgb 12, plts wnl.  Goal of Therapy:  INR 2-3 Monitor platelets by anticoagulation protocol: Yes   Plan:  Give Coumadin 2.5mg  PO x 1 Monitor daily INR, CBC, s/s of bleed  Elenor Quinones, PharmD, BCPS, BCIDP Clinical Pharmacist 06/03/2018 7:51 AM

## 2018-06-03 NOTE — Progress Notes (Signed)
PROGRESS NOTE  [name]  ZOX:[id number] DOB: 1941/01/18 DOA: 05/14/2018 PCP: Lawerance Cruel, MD   Brief Narrative: [name] is a 78 y.o. male with a history of chronic HFpEF, dementia, PE on coumadin, GI bleeding and anemia of chronic disease who presented from his facility with lethargy. He was febrile, hypotensive, tachycardic and hypoxic. Code stroke was called, though CT  and CTA of the head showed no stroke or LVO. Covid testing was positive and he was transferred to [facility] He was given broad spectrum antibiotics. Hospitalization has been protracted due to severity of illness and thus complicated by deconditioning, delirium on dementia, and now dysphagia and aspiration pneumonia.  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 Continues to require 5L O2.  Sepsis due to covid-19 infection: CRP modestly better, prognosis remains guarded. - Remains positive on 5/7 (initial positive 4/27. Will need negative test for disposition.  - Avoid NSAIDs - Continue airborne/contact precautions.  Dysphagia and aspiration pneumonia:  - Dysphagia diet with nectar-thickened liquids, continue SLP evaluation and assistance. PO intake remains poor.  - Feed often and slowly.  - Continue unasyn (5/6 - 5/10), WBC normalized. CXR demonstrating right perihilar and left medial base opacities consistent with pneumonia.   Acute metabolic encephalopathy on chronic dementia with behavioral  disturbance:  - Continue home depakote, lexapro, trazodone - Will continue artane, will not restart fluphenazine as he is not currently experiencing restless delirium.    Hx PE and LLE DVT: - Continue daily INR monitoring and restart coumadin today at 2.5mg  dose (takes 5mg  daily except 2.5mg  on Tues/Fri at baseline).  Supratherapeutic INR: Despite holding coumadin, suspect due to very limited po intake. Resolved 5/9.  CAD s/p PCI and chronic HFpEF: LVEF 55% in 2019. Has been diuresed and currently appears euvolemic. BNP 30.6. - Continue BP management with norvasc and coreg, prn hydralazine also available.  - Restart home lasix 20mg  QOD  Hypokalemia:  - Replaced, improved, continue monitoring.  T2DM: HbA1c 5.8%. Lantus is on home med list, though has been at inpatient goal without insulin.  - Continue monitoring with daily labs.   Peripheral neuropathy:  - Restarted home gabapentin at 300mg  TID (600mg  home dose)  Adrenal insufficiency: Stress-dose steroids started 5/3. - Continue stress steroids 50mg  IV TID > taper as able over next few days. Home po cortef dosing was 10mg  / 20mg  / 20mg   Stage III CKD: At baseline.  - Avoid nephrotoxins  Nonanion gap metabolic acidosis: Due to diarrhea which has resolved. Flexiseal removed.   Stage II pressure injury of the right lateral hip, POA: Improving from when it was initially noted in Dec 2019.  - Continue offloading as able, optimize nutritional status.   Stage I pressure injury of sacrum:  - Foam padding, offload as able.   Hypothyroidism:  - Continue thyroid supplement  DVT prophylaxis: Coumadin Code Status: DNR Family Communication: Wife by phone today. Disposition Plan: Uncertain, guarded prognosis.   Consultants:   None  Procedures:   None  Antimicrobials:  Vancomycin, cefepime, flagyl 4/27  Vancomyin, cefepime, azithromycin 4/28 - 5/1  Unasyn 5/6 >>   Subjective: No events overnight, will open eyes to loud  voice and some touch. Will take po if fed slowly.  Objective: Vitals:   06/03/18 0458 06/03/18 0500 06/03/18 0600 06/03/18 0800  BP:    133/65  Pulse:  80 77 82  Resp:  20 20 (!) 21  Temp:    98 F (36.7 C)  TempSrc:    Axillary  SpO2:  92% 93% 93%  Weight: 83.3 kg     Height:        Intake/Output Summary (Last 24 hours) at 06/03/2018 1033 Last data filed at 06/03/2018 0840 Gross per 24 hour  Intake 804.88 ml  Output 1250 ml  Net -445.12 ml   Filed Weights   06/01/18 0614 06/02/18 0434 06/03/18 0458  Weight: 83.3 kg 82.3 kg 83.3 kg   Gen: Ill-appearing elderly male in no distress Pulm: Nonlabored tachypnea. Scant crackles at bases. CV: Regular rate and rhythm. No murmur, rub, or gallop. No JVD, trace LLE > other dependent edema. GI: Abdomen soft, non-tender, non-distended, with normoactive bowel sounds.  Ext: Warm, no deformities Skin: No rashes, lesions or ulcers on visualized skin. Neuro: Opens eyes to voice and touch, nonverbal. Not cooperative with exam.  Psych: UTD  Data Reviewed: I have personally reviewed following labs and imaging studies  CBC: Recent Labs  Lab 05/30/18 0500 05/31/18 0600 06/01/18 0421 06/02/18 0447 06/03/18 0515  WBC 10.1 9.6 12.3* 14.6* 10.5  NEUTROABS 8.1* 7.9* 10.4* 13.0* 8.2*  HGB 13.5 13.0 12.8* 12.4* 12.0*  HCT 42.9 40.2 40.7 38.5* 38.7*  MCV 89.6 89.5 89.3 89.5 90.4  PLT 242 222 206 186 756   Basic Metabolic Panel: Recent Labs  Lab 05/29/18 0732 05/30/18 0500 05/31/18 0600 06/01/18 0421 06/02/18 0447  NA 140 142 140 144 145  K 3.2* 3.5 3.4* 3.2* 3.6  CL 115* 114* 114* 116* 116*  CO2 17* 18* 18* 21* 20*  GLUCOSE 127* 127* 132* 106* 145*  BUN 22 24* 21 19 21   CREATININE 1.22 1.07 1.08 0.93 0.91  CALCIUM 7.8* 7.9* 7.7* 7.7* 7.7*  MG 2.1 2.0 2.0 2.0 2.1  PHOS 2.7  --   --   --   --    GFR: Estimated Creatinine Clearance: 70.2 mL/min (by C-G formula based on SCr of 0.91 mg/dL). Liver Function Tests: Recent Labs  Lab  05/28/18 0455 05/30/18 0500 05/31/18 0600 06/01/18 0421 06/02/18 0447  AST 29 28 25 21 22   ALT 17 22 23 23 22   ALKPHOS 49 51 51 51 60  BILITOT 0.5 0.5 0.7 0.6 0.6  PROT 5.0* 6.0* 5.9* 5.7* 5.8*  ALBUMIN 2.1* 2.3* 2.4* 2.3* 2.2*   Coagulation Profile: Recent Labs  Lab 05/30/18 0500 05/31/18 0600 06/01/18 0421 06/02/18 0447 06/03/18 0515  INR 4.0* 4.3* 4.9* 3.2* 2.6*   Anemia Panel: Recent Labs    06/02/18 0447 06/03/18 0515  FERRITIN 326 432*   Urine analysis:    Component Value Date/Time   COLORURINE YELLOW 05/24/2018 0500   APPEARANCEUR CLEAR 05/24/2018 0500   LABSPEC 1.018 05/24/2018 0500   PHURINE 6.0 05/24/2018 0500   GLUCOSEU NEGATIVE 05/24/2018 0500   HGBUR NEGATIVE 05/24/2018 0500   BILIRUBINUR NEGATIVE 05/24/2018 0500   KETONESUR 20 (A) 05/24/2018 0500   PROTEINUR NEGATIVE 05/24/2018 0500   UROBILINOGEN 0.2 03/21/2007 1015   NITRITE NEGATIVE 05/24/2018 0500   LEUKOCYTESUR NEGATIVE 05/24/2018 0500   Recent  Results (from the past 240 hour(s))  C Difficile Quick Screen w PCR reflex     Status: None   Collection Time: 05/24/18  4:52 PM  Result Value Ref Range Status   C Diff antigen NEGATIVE NEGATIVE Final   C Diff toxin NEGATIVE NEGATIVE Final   C Diff interpretation No C. difficile detected.  Final    Comment: Performed at [facility], [place] [address] [place], [place] [address]  MRSA PCR Screening     Status: None   Collection Time: 05/31/18  7:27 AM  Result Value Ref Range Status   MRSA by PCR NEGATIVE NEGATIVE Final    Comment:        The GeneXpert MRSA Assay (FDA approved for NASAL specimens only), is one component of a comprehensive MRSA colonization surveillance program. It is not intended to diagnose MRSA infection nor to guide or monitor treatment for MRSA infections. Performed at [facility], [place] [address] [place], [place] [address]   SARS Coronavirus 2 (CEPHEID- Performed in [place]  hospital lab), Hosp Order     Status: Abnormal   Collection Time: 06/01/18 11:15 AM  Result Value Ref Range Status   SARS Coronavirus 2 POSITIVE (A) NEGATIVE Final    Comment: RESULT CALLED TO, READ BACK BY AND VERIFIED WITH: CURRY,E. RN @1448  ON 05.07.2020 BY COHEN,K (NOTE) SARS CoV 2 target nucleic acids are DETECTED. The SARS CoV 2 RNA is generally detectable in upper and lower respiratory specimens during the acute phase of infection. Positive results are indicative of active infection with SARS CoV 2. Clinical  correlation with patient history and other diagnostic information is necessary to determine patient infection status. Positive results do  not rule out bacterial infection or coinfection with other viruses. The expected result is Negative. Fact Sheet for Patients:  https [employer] media 829562 download Fact Sheet for Healthcare Providers:  https [employer] media 276-271-8290 download This test is not yet approved or cleared by the [place] and  has been authorized for detection and/or diagnosis of SARS CoV 2 by FDA under an Emergency Use Authorization (EUA).  This EUA will remain in effect (meaning this test can be used) fo r the duration of  the COVID19 declaration under Section 564(b)(1) of the Act, 21 U.S.C.  section 360bbb-3(b)(1), unless the authorization is terminated or revoked sooner. Performed at [facility], [place] [address], [place], [place] [address]       Radiology Studies: No results found.  Scheduled Meds: . amLODipine  5 mg Oral Daily  . carvedilol  6.25 mg Oral BID WC  . chlorhexidine  15 mL Mouth Rinse BID  . cholecalciferol  1,000 Units Oral Daily  . divalproex  125 mg Oral TID  . escitalopram  10 mg Oral Daily  . feeding supplement (ENSURE ENLIVE)  237 mL Oral TID BM  . gabapentin  300 mg Oral TID  . hydrocortisone sod succinate (SOLU-CORTEF) inj  50 mg Intravenous Q8H  . mouth rinse  15 mL Mouth Rinse q12n4p  .  multivitamin with minerals  1 tablet Oral Daily  . psyllium  1 packet Oral Daily  . sodium chloride flush  3 mL Intravenous Q12H  . thyroid  60 mg Oral Daily  . traZODone  75 mg Oral QHS  . trihexyphenidyl  2 mg Oral Daily  . warfarin  2.5 mg Oral ONCE-1800  . Warfarin - Pharmacist Dosing Inpatient   Does not apply q1800  .  zinc sulfate  220 mg Oral Daily   Continuous Infusions: . sodium chloride 10 mL/hr at 06/03/18 0600  . ampicillin-sulbactam (UNASYN) IV 3 g (06/03/18 0805)     LOS: 12 days   Time spent: 35 minutes.  Patrecia Pour, MD [facility] [email or web address] Password [email or web address] 06/03/2018, 10:33 AM

## 2018-06-03 NOTE — Progress Notes (Signed)
Patient appears more labored in breathing and lungs more congested than earlier this morning. Crackles continue in bases but upper lobes with rhonchi now. No cough noted currently, but patient has recently been drinking protein drink and will not slow down with drinking even when cautioned to slow down. VS 98.8-91-23-148/77 with sats 97% 6LPM. Patient with no complaints currently. MD notified of changes. Awaiting return call. Will continue to monitor.

## 2018-06-03 NOTE — Progress Notes (Signed)
Notification received from telemetry that patient was maintaining sats in upper 70s. Had just been notified by tech that patient had coughing and choking episode while drinking with dinner. Patient assessed; states he does not feel good in general and feels somewhat short of breath. Increased [place] to 7 LPM and paged MD on call. Will continue to monitor.

## 2018-06-04 ENCOUNTER — Inpatient Hospital Stay (HOSPITAL_COMMUNITY): Payer: Medicare HMO

## 2018-06-04 LAB — D-DIMER, QUANTITATIVE (NOT AT ARMC): D-Dimer, Quant: 0.89 ug/mL-FEU — ABNORMAL HIGH (ref 0.00–0.50)

## 2018-06-04 LAB — CBC
HCT: 39.3 % (ref 39.0–52.0)
Hemoglobin: 12.1 g/dL — ABNORMAL LOW (ref 13.0–17.0)
MCH: 28 pg (ref 26.0–34.0)
MCHC: 30.8 g/dL (ref 30.0–36.0)
MCV: 91 fL (ref 80.0–100.0)
Platelets: 276 10*3/uL (ref 150–400)
RBC: 4.32 MIL/uL (ref 4.22–5.81)
RDW: 16.5 % — ABNORMAL HIGH (ref 11.5–15.5)
WBC: 10.1 10*3/uL (ref 4.0–10.5)
nRBC: 0 % (ref 0.0–0.2)

## 2018-06-04 LAB — BASIC METABOLIC PANEL
Anion gap: 10 (ref 5–15)
BUN: 21 mg/dL (ref 8–23)
CO2: 24 mmol/L (ref 22–32)
Calcium: 7.6 mg/dL — ABNORMAL LOW (ref 8.9–10.3)
Chloride: 119 mmol/L — ABNORMAL HIGH (ref 98–111)
Creatinine, Ser: 0.91 mg/dL (ref 0.61–1.24)
GFR calc Af Amer: >60 mL/min (ref >60)
GFR calc non Af Amer: >60 mL/min (ref >60)
Glucose, Bld: 113 mg/dL — ABNORMAL HIGH (ref 70–99)
Potassium: 3.5 mmol/L (ref 3.5–5.1)
Sodium: 153 mmol/L — ABNORMAL HIGH (ref 135–145)

## 2018-06-04 LAB — FERRITIN: Ferritin: 401 ng/mL — ABNORMAL HIGH (ref 24–336)

## 2018-06-04 LAB — PROTIME-INR
INR: 2.3 — ABNORMAL HIGH (ref 0.8–1.2)
Prothrombin Time: 25.1 seconds — ABNORMAL HIGH (ref 11.4–15.2)

## 2018-06-04 LAB — PROCALCITONIN: Procalcitonin: <0.1 ng/mL

## 2018-06-04 LAB — MAGNESIUM: Magnesium: 2.1 mg/dL (ref 1.7–2.4)

## 2018-06-04 LAB — C-REACTIVE PROTEIN: CRP: 16.5 mg/dL — ABNORMAL HIGH (ref <1.0)

## 2018-06-04 MED ORDER — DEXTROSE 5 % IV SOLN
INTRAVENOUS | Status: DC
  Administered 2018-06-04: 10:00:00 via INTRAVENOUS

## 2018-06-04 MED ORDER — WARFARIN SODIUM 2.5 MG PO TABS
2.5000 mg | ORAL_TABLET | Freq: Once | ORAL | Status: AC
  Administered 2018-06-04: 18:00:00 2.5 mg via ORAL
  Filled 2018-06-04: qty 1

## 2018-06-04 MED ORDER — HYDROCORTISONE NA SUCCINATE PF 100 MG IJ SOLR
50.0000 mg | Freq: Two times a day (BID) | INTRAMUSCULAR | Status: DC
  Administered 2018-06-04 – 2018-06-05 (×2): 50 mg via INTRAVENOUS
  Filled 2018-06-04 (×2): qty 2

## 2018-06-04 NOTE — Progress Notes (Signed)
Occupational Therapy Treatment Patient Details Name: [name] MRN: [id number] DOB: 1940-08-22 Today's Date: 06/04/2018    History of present illness 78 yo male presenting from SNF with AMS. Tested COVID positive. PMH including CHF, h/o GI bleeding, Addison's disease, dementia, anemia chronic disease, and pulmonary embolus.    OT comments  Focus of session on UE deficits as pt continues to have decreased ROM and with increased edema noted; provided gentle passive stretching to bil UEs prior to donning of resting hand splints. Trialing wear of splints today to assess for pt tolerance as pt continues to maintain hands in flexion at rest. Use of resting hand splints to prevent further stiffness and to reduce risk for contractures. [name] RUE noted with more stiffness vs LUE at this time, but able to passive stretch into extension given increased time/prolonged stretching. Pt reports no pain with stretching/ROM or with donning of splints. UEs positioned in elevation for further edema management. Will plan to check back to perform splint check later today.    Follow Up Recommendations  SNF;Supervision/Assistance - 24 hour    Equipment Recommendations  None recommended by OT          Precautions / Restrictions Precautions Precautions: Fall Precaution Comments: significant tone all 4 extremities Restrictions Weight Bearing Restrictions: No       Mobility Bed Mobility Overal bed mobility: Needs Assistance             General bed mobility comments: totalA to reposition in bed  Transfers                      Balance                                           ADL either performed or assessed with clinical judgement   ADL Overall ADL's : Needs assistance/impaired                                       General ADL Comments: totalA at this time; focus of session on UE stretching, edema management and donning of resting hand splints  to trial wear schedule     Vision       Perception     Praxis      Cognition Arousal/Alertness: Awake/alert Behavior During Therapy: Flat affect Overall Cognitive Status: History of cognitive impairments - at baseline                                 General Comments: pt minimally responsive during session        Exercises Exercises: Other exercises Other Exercises Other Exercises: gentle passive stretching to bil UEs, focus on hand, wrist and elbow   Shoulder Instructions       General Comments      Pertinent Vitals/ Pain       Pain Assessment: No/denies pain  Home Living                                          Prior Functioning/Environment              Frequency  Min 2X/week        Progress Toward Goals  OT Goals(current goals can now be found in the care plan section)  Progress towards OT goals: OT to reassess next treatment  Acute Rehab OT Goals Patient Stated Goal: Unstated by pt. wife wanting him safe and return to rehab OT Goal Formulation: With patient/family Time For Goal Achievement: 06/06/18 Potential to Achieve Goals: Good  Plan Discharge plan remains appropriate    Co-evaluation                 AM-PAC OT "6 Clicks" Daily Activity     Outcome Measure   Help from another person eating meals?: Total Help from another person taking care of personal grooming?: Total Help from another person toileting, which includes using toliet, bedpan, or urinal?: Total Help from another person bathing (including washing, rinsing, drying)?: Total Help from another person to put on and taking off regular upper body clothing?: Total Help from another person to put on and taking off regular lower body clothing?: Total 6 Click Score: 6    End of Session Equipment Utilized During Treatment: Oxygen(NRB mask)  OT Visit Diagnosis: Unsteadiness on feet (R26.81);Other abnormalities of gait and mobility  (R26.89);Muscle weakness (generalized) (M62.81);Other symptoms and signs involving cognitive function   Activity Tolerance Patient tolerated treatment well   Patient Left in bed;with call bell/phone within reach;with bed alarm set   Nurse Communication Mobility status        Time: 5909-3112 OT Time Calculation (min): 31 min  Charges: OT General Charges $OT Visit: 1 Visit OT Treatments $Therapeutic Activity: 23-37 mins  Lou Cal, [place] Pager [phone] Office [place] 06/04/2018, 4:06 PM

## 2018-06-04 NOTE — Progress Notes (Signed)
Occupational Therapy Treatment Patient Details Name: [name] MRN: [id number] DOB: December 01, 1940 Today's Date: 06/04/2018    History of present illness 78 yo male presenting from SNF with AMS. Tested COVID positive. PMH including CHF, h/o GI bleeding, Addison's disease, dementia, anemia chronic disease, and pulmonary embolus.    OT comments  Pt seen for splint check. Pt wearing splints approx 1.5 hrs prior to check. He does report some pain/discomfort in bil hands, unable to rate pain but when asked if R hand hurt worse than L pt states "yes". L hand appears to tolerate wear of splint more than R. R arm with minimal signs of pressure under R forearm which appear to improve after approx 10-15 min. No other adverse signs/symptoms noted. Will re-assess fit of splint during next trial - may need to modify splint wear schedule initially as it appears pt may only be able to tolerate for short periods of time. Will continue to follow.   Follow Up Recommendations  SNF;Supervision/Assistance - 24 hour    Equipment Recommendations  None recommended by OT          Precautions / Restrictions Precautions Precautions: Fall Precaution Comments: significant tone all 4 extremities Restrictions Weight Bearing Restrictions: No       Mobility Bed Mobility Overal bed mobility: Needs Assistance             General bed mobility comments: totalA to reposition in bed  Transfers                      Balance                                           ADL either performed or assessed with clinical judgement   ADL Overall ADL's : Needs assistance/impaired                                       General ADL Comments: splint check completed with pt wearing splints approx 1.5 hrs - pt reports some increase in pain with wear, noted indentation in volar aspect of R forearm from splint which appeared to improve after approx 10-15 min, informed RN as well  to continue monitoring; LUE with no adverse reactions noted      Vision       Perception     Praxis      Cognition Arousal/Alertness: Awake/alert Behavior During Therapy: Flat affect Overall Cognitive Status: History of cognitive impairments - at baseline                                 General Comments: pt minimally responsive during session, does state "I'm dying of thirst" during this session        Exercises Exercises: Other exercises Other Exercises Other Exercises: gentle passive stretching to bil UEs, focus on hand, wrist and elbow   Shoulder Instructions       General Comments VSS    Pertinent Vitals/ Pain       Pain Assessment: Faces Faces Pain Scale: Hurts little more Pain Location: reports hands, states yes when asked if R hand hurts worse than L, unable to give a number when asked Pain Descriptors / Indicators: Discomfort Pain Intervention(s): Monitored  during session;Repositioned  Home Living                                          Prior Functioning/Environment              Frequency  Min 2X/week        Progress Toward Goals  OT Goals(current goals can now be found in the care plan section)  Progress towards OT goals: Progressing toward goals  Acute Rehab OT Goals Patient Stated Goal: Unstated by pt. wife wanting him safe and return to rehab OT Goal Formulation: With patient/family Time For Goal Achievement: 06/06/18 Potential to Achieve Goals: Good  Plan Discharge plan remains appropriate    Co-evaluation                 AM-PAC OT "6 Clicks" Daily Activity     Outcome Measure   Help from another person eating meals?: Total Help from another person taking care of personal grooming?: Total Help from another person toileting, which includes using toliet, bedpan, or urinal?: Total Help from another person bathing (including washing, rinsing, drying)?: Total Help from another person to put on  and taking off regular upper body clothing?: Total Help from another person to put on and taking off regular lower body clothing?: Total 6 Click Score: 6    End of Session Equipment Utilized During Treatment: Oxygen(NRB mask)  OT Visit Diagnosis: Unsteadiness on feet (R26.81);Other abnormalities of gait and mobility (R26.89);Muscle weakness (generalized) (M62.81);Other symptoms and signs involving cognitive function   Activity Tolerance Patient tolerated treatment well   Patient Left in bed;with call bell/phone within reach;with bed alarm set   Nurse Communication Mobility status(splint status)        Time: 4098-1191 OT Time Calculation (min): 11 min  Charges: OT General Charges $OT Visit: 1 Visit OT Treatments $Self Care/Home Management : 8-22 mins   Lou Cal, [place] Pager [phone] Office [place] 06/04/2018, 5:15 PM

## 2018-06-04 NOTE — Progress Notes (Signed)
PROGRESS NOTE  [name]  WCB:[id number] DOB: 1940-03-06 DOA: 05/13/2018 PCP: Lawerance Cruel, MD   Brief Narrative: [name] is a 78 y.o. male with a history of chronic HFpEF, dementia, PE on coumadin, GI bleeding and anemia of chronic disease who presented from his facility with lethargy. He was febrile, hypotensive, tachycardic and hypoxic. Code stroke was called, though CT  and CTA of the head showed no stroke or LVO. Covid testing was positive and he was transferred to [facility]. He was given broad spectrum antibiotics. Hospitalization has been protracted due to severity of illness and thus complicated by deconditioning, delirium on dementia, and now dysphagia and aspiration pneumonia.  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 pO2 overnight into 5/10 was 53, now on NRB 15L.  Sepsis due to covid-19 infection: CRP modestly better, prognosis remains guarded. - Remains positive on 5/7 (initial positive 4/27). Would need negative test, in addition to significant clinical improvement, for disposition.  - Avoid NSAIDs - Continue airborne/contact precautions.  Dysphagia and aspiration pneumonia:  - Dysphagia diet with nectar-thickened liquids, will ask for SLP reevaluation, perhaps thickening liquids more? Incomplete adherence to aspiration precautions by patient. - Continue unasyn (5/6 - 5/10), WBC normalized. CXR with stable airspace opacities.   Hypernatremia: Free water  deficit due to poor po, worsened by diuretic. Thirst mechanism is intact.  - Will allow him to drink after discussing possibility of life-ending aspiration with his wife. - Start low rate D5W and recheck in AM - Decrease steroids.   Acute metabolic encephalopathy on chronic dementia with behavioral disturbance:  - Continue home depakote, lexapro, trazodone - Will continue artane, will not restart fluphenazine as he is not currently experiencing restless delirium.    Hx PE and LLE DVT: - Continue daily INR monitoring, restarted coumadin 5/9 at 2.5mg  dose (takes 5mg  daily except 2.5mg  on Tues/Fri at baseline).  Supratherapeutic INR: Despite holding coumadin, suspect due to very limited po intake. Resolved 5/9.  CAD s/p PCI and chronic HFpEF: LVEF 55% in 2019. Has been diuresed and currently appears euvolemic. BNP 30.6. - Continue BP management with norvasc and coreg, prn hydralazine also available.  - Not overloaded, will hold home lasix 20mg  QOD  Hypokalemia:  - Replaced, improved, continue monitoring.  T2DM: HbA1c 5.8%. Lantus is on home med list, though has been at inpatient goal without insulin.  - Continue monitoring with daily labs.   Peripheral neuropathy:  - Restarted home gabapentin at 300mg  TID (600mg  home dose)  Adrenal insufficiency: Stress-dose steroids started 5/3. - Continue stress steroids 50mg  IV TID > q12h and will continue to taper as able over next few days. Home po cortef dosing was 10mg  / 20mg  / 20mg . No hypotension.  Stage III CKD: At baseline.  - Avoid nephrotoxins  Nonanion gap metabolic acidosis: Due to diarrhea which has resolved. Flexiseal removed.   Stage II pressure injury of the right lateral hip, POA: Improving from when it was initially noted in Dec 2019.  - Continue offloading as  able, optimize nutritional status.   Stage I pressure injury of sacrum:  - Foam padding, offload as able.   Hypothyroidism:  - Continue thyroid supplement  DVT  prophylaxis: Coumadin Code Status: DNR Family Communication: Wife by phone today for about 10 minutes, confirmed that the plan of care remains to allow him to take po ad lib. If aspiration event recurs or respiratory distress develops, would contact her again to make her aware and start morphine to treat dyspnea and make him comfortable knowing that this would be a terminal event.  Disposition Plan: Uncertain, guarded prognosis.   Consultants:   None  Procedures:   None  Antimicrobials:  Vancomycin, cefepime, flagyl 4/27  Vancomyin, cefepime, azithromycin 4/28 - 5/1  Unasyn 5/6 >>   Subjective: Had a choking episode with dinner last night and seemed to become less comfortable breathing earlier in the day. Now requiring NRB and reports feeling more short of breath. No chest or other pain. Says he's thirsty.   Objective: Vitals:   06/04/18 0400 06/04/18 0409 06/04/18 0800 06/04/18 0900  BP: 133/66  113/65   Pulse: 66  87   Resp: 14  19   Temp:  97.9 F (36.6 C)  99 F (37.2 C)  TempSrc:  Axillary    SpO2: 99%  98%   Weight:      Height:        Intake/Output Summary (Last 24 hours) at 06/04/2018 1306 Last data filed at 06/04/2018 0658 Gross per 24 hour  Intake 484.87 ml  Output 850 ml  Net -365.13 ml   Filed Weights   06/01/18 0614 06/02/18 0434 06/03/18 0458  Weight: 83.3 kg 82.3 kg 83.3 kg   Gen: Diaphoretic ill-appearing male   Pulm: Mildly labored tachypnea on 15L NRB. CV: Regular rate and rhythm. No murmur, rub, or gallop. No JVD, No significant dependent edema, though nonpitting LLE edema continues. GI: Abdomen soft, non-tender, non-distended, with normoactive bowel sounds.  Ext: Warm, no deformities Skin: No new rshes, lesions or ulcers on visualized skin. Neuro: Alert, partially oriented. Diffusely very weak. Psych: Judgement and insight impaired by deficits in recall.  Data Reviewed: I have personally reviewed following labs and imaging studies  CBC:  Recent Labs  Lab 05/30/18 0500 05/31/18 0600 06/01/18 0421 06/02/18 0447 06/03/18 0515 06/03/18 2110 06/04/18 0610  WBC 10.1 9.6 12.3* 14.6* 10.5  --  10.1  NEUTROABS 8.1* 7.9* 10.4* 13.0* 8.2*  --   --   HGB 13.5 13.0 12.8* 12.4* 12.0* 10.9* 12.1*  HCT 42.9 40.2 40.7 38.5* 38.7* 32.0* 39.3  MCV 89.6 89.5 89.3 89.5 90.4  --  91.0  PLT 242 222 206 186 243  --  132   Basic Metabolic Panel: Recent Labs  Lab 05/29/18 0732 05/30/18 0500 05/31/18 0600 06/01/18 0421 06/02/18 0447 06/03/18 2110 06/04/18 0610  NA 140 142 140 144 145 153* 153*  K 3.2* 3.5 3.4* 3.2* 3.6 2.8* 3.5  CL 115* 114* 114* 116* 116*  --  119*  CO2 17* 18* 18* 21* 20*  --  24  GLUCOSE 127* 127* 132* 106* 145*  --  113*  BUN 22 24* 21 19 21   --  21  CREATININE 1.22 1.07 1.08 0.93 0.91  --  0.91  CALCIUM 7.8* 7.9* 7.7* 7.7* 7.7*  --  7.6*  MG 2.1 2.0 2.0 2.0 2.1  --  2.1  PHOS 2.7  --   --   --   --   --   --  GFR: Estimated Creatinine Clearance: 70.2 mL/min (by C-G formula based on SCr of 0.91 mg/dL). Liver Function Tests: Recent Labs  Lab 05/30/18 0500 05/31/18 0600 06/01/18 0421 06/02/18 0447  AST 28 25 21 22   ALT 22 23 23 22   ALKPHOS 51 51 51 60  BILITOT 0.5 0.7 0.6 0.6  PROT 6.0* 5.9* 5.7* 5.8*  ALBUMIN 2.3* 2.4* 2.3* 2.2*   Coagulation Profile: Recent Labs  Lab 05/31/18 0600 06/01/18 0421 06/02/18 0447 06/03/18 0515 06/04/18 0610  INR 4.3* 4.9* 3.2* 2.6* 2.3*   Anemia Panel: Recent Labs    06/03/18 0515 06/04/18 0610  FERRITIN 432* 401*   Urine analysis:    Component Value Date/Time   COLORURINE YELLOW 05/24/2018 0500   APPEARANCEUR CLEAR 05/24/2018 0500   LABSPEC 1.018 05/24/2018 0500   PHURINE 6.0 05/24/2018 0500   GLUCOSEU NEGATIVE 05/24/2018 0500   HGBUR NEGATIVE 05/24/2018 0500   BILIRUBINUR NEGATIVE 05/24/2018 0500   KETONESUR 20 (A) 05/24/2018 0500   PROTEINUR NEGATIVE 05/24/2018 0500   UROBILINOGEN 0.2 03/21/2007 1015   NITRITE NEGATIVE 05/24/2018 0500    LEUKOCYTESUR NEGATIVE 05/24/2018 0500   Recent Results (from the past 240 hour(s))  MRSA PCR Screening     Status: None   Collection Time: 05/31/18  7:27 AM  Result Value Ref Range Status   MRSA by PCR NEGATIVE NEGATIVE Final    Comment:        The GeneXpert MRSA Assay (FDA approved for NASAL specimens only), is one component of a comprehensive MRSA colonization surveillance program. It is not intended to diagnose MRSA infection nor to guide or monitor treatment for MRSA infections. Performed at [facility], [place] [address] [place], [place] [address]   SARS Coronavirus 2 (CEPHEID- Performed in [place] hospital lab), Hosp Order     Status: Abnormal   Collection Time: 06/01/18 11:15 AM  Result Value Ref Range Status   SARS Coronavirus 2 POSITIVE (A) NEGATIVE Final    Comment: RESULT CALLED TO, READ BACK BY AND VERIFIED WITH: CURRY,E. RN @1448  ON 05.07.2020 BY COHEN,K (NOTE) SARS CoV 2 target nucleic acids are DETECTED. The SARS CoV 2 RNA is generally detectable in upper and lower respiratory specimens during the acute phase of infection. Positive results are indicative of active infection with SARS CoV 2. Clinical  correlation with patient history and other diagnostic information is necessary to determine patient infection status. Positive results do  not rule out bacterial infection or coinfection with other viruses. The expected result is Negative. Fact Sheet for Patients:  https [employer] media 474259 download Fact Sheet for Healthcare Providers:  https [employer] media 262 223 2851 download This test is not yet approved or cleared by the [place] and  has been authorized for detection and/or diagnosis of SARS CoV 2 by FDA under an Emergency Use Authorization (EUA).  This EUA will remain in effect (meaning this test can be used) fo r the duration of  the COVID19 declaration under Section 564(b)(1) of the Act, 21 U.S.C.  section 360bbb-3(b)(1),  unless the authorization is terminated or revoked sooner. Performed at [facility], [place] [address], [place], [place] [address]       Radiology Studies: Dg Chest Port 1 View  Result Date: 06/04/2018 CLINICAL DATA:  COVID-19 positive.  Pneumonia EXAM: PORTABLE CHEST 1 VIEW COMPARISON:  05/31/2018 FINDINGS: Normal cardiac silhouette. Low lung volumes. Patchy central airspace disease is similar to comparison exam. No focal consolidation. IMPRESSION: Subtle patchy central airspace disease similar  to comparison exam. No significant change. Low lung volumes. Electronically Signed   By: Suzy Bouchard M.D.   On: 06/04/2018 04:45    Scheduled Meds: . amLODipine  5 mg Oral Daily  . carvedilol  6.25 mg Oral BID WC  . chlorhexidine  15 mL Mouth Rinse BID  . cholecalciferol  1,000 Units Oral Daily  . divalproex  125 mg Oral TID  . escitalopram  10 mg Oral Daily  . feeding supplement (ENSURE ENLIVE)  237 mL Oral TID BM  . gabapentin  300 mg Oral TID  . hydrocortisone sod succinate (SOLU-CORTEF) inj  50 mg Intravenous Q12H  . mouth rinse  15 mL Mouth Rinse q12n4p  . multivitamin with minerals  1 tablet Oral Daily  . psyllium  1 packet Oral Daily  . sodium chloride flush  3 mL Intravenous Q12H  . thyroid  60 mg Oral Daily  . traZODone  75 mg Oral QHS  . trihexyphenidyl  2 mg Oral Daily  . warfarin  2.5 mg Oral ONCE-1800  . Warfarin - Pharmacist Dosing Inpatient   Does not apply q1800  . zinc sulfate  220 mg Oral Daily   Continuous Infusions: . sodium chloride 10 mL/hr at 06/04/18 0500  . ampicillin-sulbactam (UNASYN) IV 3 g (06/04/18 5009)  . dextrose 50 mL/hr at 06/04/18 0954     LOS: 13 days   Time spent: 35 minutes.  Patrecia Pour, MD [facility] [email or web address] Password [email or web address] 06/04/2018, 1:06 PM

## 2018-06-04 NOTE — Progress Notes (Signed)
OT Cancellation Note  Patient Details Name: [name] MRN: [id number] DOB: Feb 07, 1940   Cancelled Treatment:    Reason Eval/Treat Not Completed: Other (comment); spoke with RN, pt with increased pain this AM and currently on NRB; will follow up for OT treatment as schedule permits.  Lou Cal, OT [facility] Pager [phone] Office [place] 06/04/2018, 10:38 AM

## 2018-06-04 NOTE — Progress Notes (Signed)
ANTICOAGULATION CONSULT NOTE - Follow Up Consult  Pharmacy Consult for Warfarin Indication: Hx PE  Labs: Recent Labs    06/02/18 0447 06/03/18 0515 06/03/18 2110 06/04/18 0610  HGB 12.4* 12.0* 10.9* 12.1*  HCT 38.5* 38.7* 32.0* 39.3  PLT 186 243  --  276  LABPROT 32.5* 27.3*  --  25.1*  INR 3.2* 2.6*  --  2.3*  CREATININE 0.91  --   --  0.91    Estimated Creatinine Clearance: 70.2 mL/min (by C-G formula based on SCr of 0.91 mg/dL).   Medications:  Scheduled:  . amLODipine  5 mg Oral Daily  . carvedilol  6.25 mg Oral BID WC  . chlorhexidine  15 mL Mouth Rinse BID  . cholecalciferol  1,000 Units Oral Daily  . divalproex  125 mg Oral TID  . escitalopram  10 mg Oral Daily  . feeding supplement (ENSURE ENLIVE)  237 mL Oral TID BM  . gabapentin  300 mg Oral TID  . hydrocortisone sod succinate (SOLU-CORTEF) inj  50 mg Intravenous Q12H  . mouth rinse  15 mL Mouth Rinse q12n4p  . multivitamin with minerals  1 tablet Oral Daily  . psyllium  1 packet Oral Daily  . sodium chloride flush  3 mL Intravenous Q12H  . thyroid  60 mg Oral Daily  . traZODone  75 mg Oral QHS  . trihexyphenidyl  2 mg Oral Daily  . Warfarin - Pharmacist Dosing Inpatient   Does not apply q1800  . zinc sulfate  220 mg Oral Daily    Assessment: 38 yom on warfarin PTA for PE/DVT history presenting with AMS, sepsis due to COVID positive. CVA work-up initiated. Noted remote hx of GIB, but no active bleed issues documented.  Warfarin PTA (5mg  MWThSaSu, 2.5mg  Tu,F) for hx of PE. INR back down to 2.3 after holding for a week and restarting on 5/9. Probably caused by poor PO intake. Ddimer only slightly elevated. Hgb 12.1, plts wnl.  Goal of Therapy:  INR 2-3 Monitor platelets by anticoagulation protocol: Yes   Plan:  Give Coumadin 2.5mg  PO x 1 Monitor daily INR, CBC, s/s of bleed  Elenor Quinones, PharmD, BCPS, BCIDP Clinical Pharmacist 06/04/2018 10:24 AM

## 2018-06-04 NOTE — Progress Notes (Signed)
  Speech Language Pathology Treatment: Dysphagia  Patient Details Name: [name] MRN: [id number] DOB: 1940/05/25 Today's Date: 06/04/2018 Time: 1355-1405 SLP Time Calculation (min) (ACUTE ONLY): 10 min  Assessment / Plan / Recommendation Clinical Impression  Pt seen due to concern aspiration events my have led to respiratory decline. Pt alert on non rebreather but flat affect, weak congested baseline cough. Removal of mask for PO did not change O2 sats. Trials of all textures (puree/honey/thin) yielded similar result; multiple swallows then oral posture as if orally holding but also breathing with some effort and mild grunting, then multiple further swallows. No immediate coughing, maybe very slight throat clear with thin trials.   Overall, pt is struggling to both breathe and swallow. When he is weaker and more ill his mentation also wanes and seems to result in more oral holding behavior. He is at risk of aspiration and risk fluctuates with respiratory status and mentation. It is unclear whether pt was aspirating earlier this weekend when drinking consecutively, but it is possible. Certainly today pt is at high risk of aspiration due to respiratory decline. If the goal is to see if pt can improve over the next 24 hours, would advise NPO except meds crushed in puree and ice chips for comfort. SLP will f/u tomorrow for further recommendations depending on pts status.    HPI HPI: [name] is an 78 y.o. male with past medical history of diastolic heart failure, history of GI bleed, dementia, anemia, PE on Coumadin found to be lethargic 4/27, brought to ED from [facility].  Code stroke was called on admission, TPA was not given; he was found to be febrile ,hypotensive, tachycardic, and hypoxic in the ED. CT of the head negative for stroke, CT Angie of the head and neck showed no significant stenosis. Patient was found to have COVID-19 positive PCR. Now with generalized decline; minimal PO  intake.        SLP Plan  Continue with current plan of care       Recommendations  Diet recommendations: NPO;Other(comment)(ice chips, bites ofpuree) Medication Administration: Crushed with puree Supervision: Full supervision/cueing for compensatory strategies                Plan: Continue with current plan of care       GO               [name], MA [place] Pager [phone] Office [phone]  Lynann Beaver 06/04/2018, 2:16 PM

## 2018-06-04 NOTE — Plan of Care (Signed)
Patient stable, discussed POC with patient, tolerates PRN IV medicine for pain, Patient agreeable with plan, denies question/concerns at this time.

## 2018-06-05 ENCOUNTER — Inpatient Hospital Stay (HOSPITAL_COMMUNITY): Payer: Medicare HMO

## 2018-06-05 LAB — COMPREHENSIVE METABOLIC PANEL
ALT: 36 U/L (ref 0–44)
AST: 36 U/L (ref 15–41)
Albumin: 1.7 g/dL — ABNORMAL LOW (ref 3.5–5.0)
Alkaline Phosphatase: 61 U/L (ref 38–126)
Anion gap: 8 (ref 5–15)
BUN: 23 mg/dL (ref 8–23)
CO2: 25 mmol/L (ref 22–32)
Calcium: 7.3 mg/dL — ABNORMAL LOW (ref 8.9–10.3)
Chloride: 120 mmol/L — ABNORMAL HIGH (ref 98–111)
Creatinine, Ser: 0.95 mg/dL (ref 0.61–1.24)
GFR calc Af Amer: >60 mL/min (ref >60)
GFR calc non Af Amer: >60 mL/min (ref >60)
Glucose, Bld: 131 mg/dL — ABNORMAL HIGH (ref 70–99)
Potassium: 2.9 mmol/L — ABNORMAL LOW (ref 3.5–5.1)
Sodium: 153 mmol/L — ABNORMAL HIGH (ref 135–145)
Total Bilirubin: 0.3 mg/dL (ref 0.3–1.2)
Total Protein: 4.8 g/dL — ABNORMAL LOW (ref 6.5–8.1)

## 2018-06-05 LAB — PROTIME-INR
INR: 3 — ABNORMAL HIGH (ref 0.8–1.2)
Prothrombin Time: 30.3 seconds — ABNORMAL HIGH (ref 11.4–15.2)

## 2018-06-05 LAB — CBC
HCT: 38.2 % — ABNORMAL LOW (ref 39.0–52.0)
Hemoglobin: 11.8 g/dL — ABNORMAL LOW (ref 13.0–17.0)
MCH: 28.9 pg (ref 26.0–34.0)
MCHC: 30.9 g/dL (ref 30.0–36.0)
MCV: 93.6 fL (ref 80.0–100.0)
Platelets: 276 10*3/uL (ref 150–400)
RBC: 4.08 MIL/uL — ABNORMAL LOW (ref 4.22–5.81)
RDW: 16.7 % — ABNORMAL HIGH (ref 11.5–15.5)
WBC: 10.3 10*3/uL (ref 4.0–10.5)
nRBC: 0 % (ref 0.0–0.2)

## 2018-06-05 LAB — D-DIMER, QUANTITATIVE: D-Dimer, Quant: 3.34 ug/mL-FEU — ABNORMAL HIGH (ref 0.00–0.50)

## 2018-06-05 LAB — C-REACTIVE PROTEIN: CRP: 19.2 mg/dL — ABNORMAL HIGH (ref <1.0)

## 2018-06-05 MED ORDER — GLYCOPYRROLATE 0.2 MG/ML IJ SOLN
0.2000 mg | INTRAMUSCULAR | Status: DC | PRN
  Administered 2018-06-05: 0.2 mg via INTRAVENOUS
  Filled 2018-06-05: qty 1

## 2018-06-05 MED ORDER — POLYVINYL ALCOHOL 1.4 % OP SOLN
1.0000 [drp] | Freq: Four times a day (QID) | OPHTHALMIC | Status: DC | PRN
  Filled 2018-06-05: qty 15

## 2018-06-05 MED ORDER — GLYCOPYRROLATE 1 MG PO TABS
1.0000 mg | ORAL_TABLET | ORAL | Status: DC | PRN
  Filled 2018-06-05: qty 1

## 2018-06-05 MED ORDER — BIOTENE DRY MOUTH MT LIQD: 15.0000 mL | OROMUCOSAL | Status: DC | PRN

## 2018-06-05 MED ORDER — MORPHINE SULFATE (PF) 2 MG/ML IV SOLN
2.0000 mg | INTRAVENOUS | Status: DC | PRN
  Administered 2018-06-05 (×4): 2 mg via INTRAVENOUS
  Filled 2018-06-05 (×4): qty 1

## 2018-06-05 MED ORDER — HALOPERIDOL LACTATE 5 MG/ML IJ SOLN: 0.5000 mg | INTRAMUSCULAR | Status: DC | PRN

## 2018-06-05 MED ORDER — HALOPERIDOL LACTATE 2 MG/ML PO CONC
0.5000 mg | ORAL | Status: DC | PRN
  Filled 2018-06-05: qty 0.3

## 2018-06-05 MED ORDER — PSYLLIUM 95 % PO PACK
1.0000 | PACK | Freq: Every day | ORAL | Status: DC | PRN
  Filled 2018-06-05: qty 1

## 2018-06-05 MED ORDER — HALOPERIDOL 0.5 MG PO TABS
0.5000 mg | ORAL_TABLET | ORAL | Status: DC | PRN
  Filled 2018-06-05: qty 1

## 2018-06-05 MED ORDER — GLYCOPYRROLATE 0.2 MG/ML IJ SOLN: 0.2000 mg | INTRAMUSCULAR | Status: DC | PRN

## 2018-06-26 NOTE — Progress Notes (Signed)
Physical Therapy Discharge Patient Details Name: TORIS LAVERDIERE MRN: [id number] DOB: 10/20/1940 Today's Date: 06/12/18 Time:  -     Patient discharged from PT services secondary to End of life  Progressing. Please see latest therapy progress note for current level of functioning and progress toward goals.        Claretha Cooper 2018-06-12, 12:19 PM [place] Pager [phone] Office [phone]

## 2018-06-26 NOTE — Progress Notes (Signed)
Time of death 1509. Verified with Kandis Nab, RN See Bonner Puna, MD death summary for more information. Wife notified of death, funeral home information recieved

## 2018-06-26 NOTE — Progress Notes (Signed)
PROGRESS NOTE  [name]  ZOX:[id number] DOB: 07/18/1940 DOA: 05/21/2018 PCP: Lawerance Cruel, MD   Brief Narrative: [name] is a 78 y.o. male with a history of chronic HFpEF, dementia, PE on coumadin, GI bleeding and anemia of chronic disease who presented from his facility with lethargy. He was febrile, hypotensive, tachycardic and hypoxic. Code stroke was called, though CT  and CTA of the head showed no stroke or LVO. Covid testing was positive and he was transferred to [facility]. He was given broad spectrum antibiotics. Hospitalization has been protracted due to severity of illness and thus complicated by deconditioning, delirium on dementia, and now dysphagia and aspiration pneumonia.  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 This has developed despite appropriate antibiotics and NPO. - Continue supplemental oxygen. Based on goals of care discussions, this will be for comfort only. I.e. if he is bothered by the mask or cannula we do not have to have SpO2 target.  - Will give morphine prn dyspnea. Needs dose now. Discussed goals of care both with the patient and wife. We will allow her to visit him due to his being at the end of life and will focus on comfort measures for him.   Sepsis due to covid-19 infection: CRP modestly better,  prognosis remains guarded. - Remains positive on 5/7 (initial positive 4/27). Would need negative test, in addition to significant clinical improvement, for disposition.  - Avoid NSAIDs - Continue airborne/contact precautions.  Dysphagia and aspiration pneumonia:  - Dysphagia diet with nectar-thickened liquids, can take po for comfort. Has been evaluated many times by SLP. Incomplete adherence to aspiration precautions by patient. - Continued unasyn (5/6 - 5/10)   Hypernatremia: Free water deficit due to poor po, worsened by diuretic. Thirst mechanism is intact.  - Will allow him to drink after discussing possibility of life-ending aspiration with his wife. - DC IVF's and lab checks. - Continue steroids due to addison's disease.   Acute metabolic encephalopathy on chronic dementia with behavioral disturbance:  - Continue home depakote, lexapro, trazodone - Will continue artane, will not restart fluphenazine as he is not currently experiencing restless delirium.    Hx PE and LLE DVT: - Continue daily INR monitoring, restarted coumadin 5/9 at 2.5mg  dose (takes 5mg  daily except 2.5mg  on Tues/Fri at baseline).  Supratherapeutic INR: Despite holding coumadin, suspect due to very limited po intake. Resolved 5/9.  CAD s/p PCI and chronic HFpEF: LVEF 55% in 2019. Has been diuresed and currently appears euvolemic. BNP 30.6. - Continue BP management with norvasc and coreg  - Not overloaded, will hold home lasix 20mg  QOD  Hypokalemia:  - Replaced, improved, continue monitoring.  T2DM: HbA1c 5.8%. Lantus is on home med list, though has been at inpatient goal without insulin.  - Continue monitoring with daily labs.   Peripheral neuropathy:  - Restarted home gabapentin at 300mg  TID (600mg   home dose)  Adrenal insufficiency: Stress-dose steroids started 5/3. - Continue stress steroids 50mg  IV TID > q12h and will continue to taper as able over next few days. Home po cortef dosing was 10mg  / 20mg   / 20mg . No hypotension.  Stage III CKD: At baseline.  - Avoid nephrotoxins  Nonanion gap metabolic acidosis: Due to diarrhea which has resolved. Flexiseal removed.   Stage II pressure injury of the right lateral hip, POA: Improving from when it was initially noted in Dec 2019.  - Continue offloading as able, optimize nutritional status.   Stage I pressure injury of sacrum:  - Foam padding, offload as able.   Hypothyroidism:  - Continue thyroid supplement  DVT prophylaxis: Coumadin Code Status: DNR Family Communication: Wife by phone again today, discussed in depth my concern for [name] nearing the end of life from conditions which have been maximally treated. Disposition Plan: Anticipate he is near end-of-life and now anticipating hospital death.   Consultants:   None  Procedures:   None  Antimicrobials:  Vancomycin, cefepime, flagyl 4/27  Vancomyin, cefepime, azithromycin 4/28 - 5/1  Unasyn 5/6 >>   Subjective: No further choking episodes but has remained NPO. Patient and RN staff have noticed progressively worsening dyspnea. He denies chest or other pain. Doesn't like having the mask on, wants medication for shortness of breath even if it makes him breathe less.   Objective: Vitals:   06/25/2018 0543 06-25-2018 0800 Jun 25, 2018 0801 25-Jun-2018 1000  BP: (!) 138/113 (!) 154/87    Pulse: 92 97  93  Resp: 20 (!) 23  (!) 24  Temp: (!) 97.3 F (36.3 C)  97.9 F (36.6 C)   TempSrc: Axillary  Oral   SpO2: 95% 94%  98%  Weight:      Height:        Intake/Output Summary (Last 24 hours) at 06-25-18 1111 Last data filed at 25-Jun-2018 1100 Gross per 24 hour  Intake 1271.51 ml  Output 300 ml  Net 971.51 ml   Filed Weights   06/01/18 0614 06/02/18 0434 06/03/18 0458  Weight: 83.3 kg 82.3 kg 83.3 kg   Gen: Diaphoretic, ill-appearing elderly male in modest distress Pulm: Labored tachypnea, with 15L NRB falling down his face SpO2 76% sustaining. Very coarse R > L. CV:  Regular rate and rhythm. No murmur, rub, or gallop. No JVD, diffuse edema without dependence and stable LLE > RLE nonpitting swelling. GI: Abdomen soft, non-tender, non-distended, with normoactive bowel sounds.  Ext: Warm, no deformities Skin: No new rashes, lesions or ulcers on visualized skin. Neuro: Alert and oriented. Diffusely very weak, upper extremity spastic contractures stable. No new focal neurological deficits. Psych: Judgement and insight appear fair.    Data Reviewed: I have personally reviewed following labs and imaging studies  CBC: Recent Labs  Lab 05/30/18 0500 05/31/18 0600 06/01/18 0421 06/02/18 0447 06/03/18 0515 06/03/18 2110 06/04/18 0610 Jun 25, 2018 0415  WBC 10.1 9.6 12.3* 14.6* 10.5  --  10.1 10.3  NEUTROABS 8.1* 7.9* 10.4* 13.0* 8.2*  --   --   --   HGB 13.5 13.0 12.8* 12.4* 12.0* 10.9* 12.1* 11.8*  HCT 42.9 40.2 40.7 38.5* 38.7* 32.0* 39.3 38.2*  MCV 89.6 89.5 89.3 89.5 90.4  --  91.0 93.6  PLT 242 222 206 186 243  --  276 161   Basic Metabolic Panel: Recent Labs  Lab 05/30/18 0500 05/31/18 0600 06/01/18 0421 06/02/18 0447 06/03/18 2110 06/04/18 0610 2018/06/25 0415  NA 142 140 144 145  153* 153* 153*  K 3.5 3.4* 3.2* 3.6 2.8* 3.5 2.9*  CL 114* 114* 116* 116*  --  119* 120*  CO2 18* 18* 21* 20*  --  24 25  GLUCOSE 127* 132* 106* 145*  --  113* 131*  BUN 24* 21 19 21   --  21 23  CREATININE 1.07 1.08 0.93 0.91  --  0.91 0.95  CALCIUM 7.9* 7.7* 7.7* 7.7*  --  7.6* 7.3*  MG 2.0 2.0 2.0 2.1  --  2.1  --    GFR: Estimated Creatinine Clearance: 67.2 mL/min (by C-G formula based on SCr of 0.95 mg/dL). Liver Function Tests: Recent Labs  Lab 05/30/18 0500 05/31/18 0600 06/01/18 0421 06/02/18 0447 06-30-18 0415  AST 28 25 21 22  36  ALT 22 23 23 22  36  ALKPHOS 51 51 51 60 61  BILITOT 0.5 0.7 0.6 0.6 0.3  PROT 6.0* 5.9* 5.7* 5.8* 4.8*  ALBUMIN 2.3* 2.4* 2.3* 2.2* 1.7*   Coagulation Profile: Recent Labs  Lab 06/01/18 0421 06/02/18 0447  06/03/18 0515 06/04/18 0610 06-30-18 0415  INR 4.9* 3.2* 2.6* 2.3* 3.0*   Anemia Panel: Recent Labs    06/03/18 0515 06/04/18 0610  FERRITIN 432* 401*   Urine analysis:    Component Value Date/Time   COLORURINE YELLOW 05/24/2018 0500   APPEARANCEUR CLEAR 05/24/2018 0500   LABSPEC 1.018 05/24/2018 0500   PHURINE 6.0 05/24/2018 0500   GLUCOSEU NEGATIVE 05/24/2018 0500   HGBUR NEGATIVE 05/24/2018 0500   BILIRUBINUR NEGATIVE 05/24/2018 0500   KETONESUR 20 (A) 05/24/2018 0500   PROTEINUR NEGATIVE 05/24/2018 0500   UROBILINOGEN 0.2 03/21/2007 1015   NITRITE NEGATIVE 05/24/2018 0500   LEUKOCYTESUR NEGATIVE 05/24/2018 0500   Recent Results (from the past 240 hour(s))  MRSA PCR Screening     Status: None   Collection Time: 05/31/18  7:27 AM  Result Value Ref Range Status   MRSA by PCR NEGATIVE NEGATIVE Final    Comment:        The GeneXpert MRSA Assay (FDA approved for NASAL specimens only), is one component of a comprehensive MRSA colonization surveillance program. It is not intended to diagnose MRSA infection nor to guide or monitor treatment for MRSA infections. Performed at [facility], [place] [address], [place], [place] [address]   SARS Coronavirus 2 (CEPHEID- Performed in [place] hospital lab), Hosp Order     Status: Abnormal   Collection Time: 06/01/18 11:15 AM  Result Value Ref Range Status   SARS Coronavirus 2 POSITIVE (A) NEGATIVE Final    Comment: RESULT CALLED TO, READ BACK BY AND VERIFIED WITH: CURRY,E. RN @1448  ON 05.07.2020 BY COHEN,K (NOTE) SARS CoV 2 target nucleic acids are DETECTED. The SARS CoV 2 RNA is generally detectable in upper and lower respiratory specimens during the acute phase of infection. Positive results are indicative of active infection with SARS CoV 2. Clinical  correlation with patient history and other diagnostic information is necessary to determine patient infection status. Positive results do  not rule  out bacterial infection or coinfection with other viruses. The expected result is Negative. Fact Sheet for Patients:  https [employer] media 983382 download Fact Sheet for Healthcare Providers:  https [employer] media 210-289-8536 download This test is not yet approved or cleared by the [place] and  has been authorized for detection and/or diagnosis of SARS CoV 2 by FDA under an Emergency Use Authorization (EUA).  This EUA will remain in effect (meaning this test can  be used) fo r the duration of  the COVID19 declaration under Section 564(b)(1) of the Act, 21 U.S.C.  section 360bbb-3(b)(1), unless the authorization is terminated or revoked sooner. Performed at [facility], [place] [address] [place], [place] [address]       Radiology Studies: Dg Chest Port 1 View  Result Date: 06/16/18 CLINICAL DATA:  Shortness of breath EXAM: PORTABLE CHEST 1 VIEW COMPARISON:  06/04/2018 FINDINGS: There has been significant interval increase in heterogeneous pulmonary opacity of the right midlung, with generally unchanged diffuse interstitial opacity elsewhere. Findings are consistent with worsening infection or asymmetric edema. IMPRESSION: There has been significant interval increase in heterogeneous pulmonary opacity of the right midlung, with generally unchanged diffuse interstitial opacity elsewhere. Findings are consistent with worsening infection or asymmetric edema. Electronically Signed   By: Eddie Candle M.D.   On: 06-16-18 08:59   Dg Chest Port 1 View  Result Date: 06/04/2018 CLINICAL DATA:  COVID-19 positive.  Pneumonia EXAM: PORTABLE CHEST 1 VIEW COMPARISON:  05/31/2018 FINDINGS: Normal cardiac silhouette. Low lung volumes. Patchy central airspace disease is similar to comparison exam. No focal consolidation. IMPRESSION: Subtle patchy central airspace disease similar to comparison exam. No significant change. Low lung volumes. Electronically Signed   By: Suzy Bouchard M.D.   On: 06/04/2018 04:45    Scheduled Meds: . amLODipine  5 mg Oral Daily  . carvedilol  6.25 mg Oral BID WC  . chlorhexidine  15 mL Mouth Rinse BID  . divalproex  125 mg Oral TID  . escitalopram  10 mg Oral Daily  . feeding supplement (ENSURE ENLIVE)  237 mL Oral TID BM  . gabapentin  300 mg Oral TID  . hydrocortisone sod succinate (SOLU-CORTEF) inj  50 mg Intravenous Q12H  . mouth rinse  15 mL Mouth Rinse q12n4p  . sodium chloride flush  3 mL Intravenous Q12H  . thyroid  60 mg Oral Daily  . traZODone  75 mg Oral QHS  . trihexyphenidyl  2 mg Oral Daily  . Warfarin - Pharmacist Dosing Inpatient   Does not apply q1800   Continuous Infusions: . sodium chloride 10 mL/hr at Jun 16, 2018 0400  . ampicillin-sulbactam (UNASYN) IV Stopped (Jun 16, 2018 0851)  . dextrose 50 mL/hr at 06/04/18 1700     LOS: 14 days   Time spent: 35 minutes.  Patrecia Pour, MD [facility] [email or web address] Password [email or web address] 06-16-18, 11:11 AM

## 2018-06-26 NOTE — Progress Notes (Signed)
Nutrition Brief Note  Chart reviewed. Pt now nearing end of life, focusing on comfort care. NPO. No further nutrition interventions warranted at this time.  Please re-consult as needed.   Kerman Passey MS, RD, [place], [place] [phone] Pager  [phone] Weekend/On-Call Pager

## 2018-06-26 NOTE — Death Summary Note (Signed)
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 male with a history of chronic HFpEF, dementia, PE on coumadin, GI bleeding and anemia of chronic disease who presented from his facility with lethargy. He was septic, febrile, hypotensive, tachycardic and hypoxic. Code stroke was called, though CT and CTA of the head showed no stroke or LVO. Covid testing was positive and he was transferred to [facility]. He was given broad spectrum antibiotics. Hospitalization has been protracted due to severity of illness and thus complicated by deconditioning, delirium on dementia, and now dysphagia causing aspiration pneumonia refractory to speech therapy interventions and precautions. On 5/10 there was an event of choking after which the patient became increasingly hypoxic with worsening respiratory distress despite receiving unasyn and remaining NPO. He had been requiring 15L by nonrebreather for 24 hours and having worsening infiltrate on CXR on 06/09/2022. After discussion with his wife and the patient himself, morphine was given and comfort measures were  taken. The wife was allowed to visit briefly and he died that afternoon.  Pertinent Labs and Studies  Significant Diagnostic Studies Ct Angio Head W Or Wo Contrast  Result Date: May 26, 2018 CLINICAL DATA:  RIGHT-sided weakness. Altered mental status. Facial droop. EXAM: CT ANGIOGRAPHY HEAD AND NECK TECHNIQUE: Multidetector CT imaging of the head and neck was performed using the standard protocol during bolus administration of intravenous contrast. Multiplanar CT image reconstructions and MIPs were obtained to evaluate the vascular anatomy. Carotid stenosis measurements (when applicable) are obtained utilizing NASCET criteria, using the distal internal carotid diameter as the denominator. CONTRAST:  31mL OMNIPAQUE IOHEXOL 350 MG/ML SOLN COMPARISON:  Code stroke CT earlier in the day. FINDINGS: CTA NECK FINDINGS Aortic arch: Standard branching. Imaged portion shows no evidence of aneurysm or dissection. No significant stenosis of the major arch vessel origins. Aortic atherosclerosis. Right carotid system: No evidence of dissection, stenosis (50% or greater) or occlusion. Moderate calcific and soft plaque at the bifurcation. Left carotid system: Slightly greater than 50% irregular stenosis, calcific and soft plaque, at the RIGHT internal carotid artery origin. This is based on luminal measurements of 1.9/4.1 proximal/distal. No dissection. No intraluminal thrombus. Vertebral arteries: Nonstenotic calcific plaque at both vertebral origins. Both vessels are patent through the neck, codominant. Skeleton: Spondylosis. No worrisome osseous lesion. Other neck: No neck masses. Upper chest: Incompletely visualized cloudlike airspace opacity RIGHT upper lobe posterior segment. Small RIGHT pleural effusion. Review of the MIP images confirms the above findings CTA HEAD FINDINGS Anterior circulation: No significant stenosis, proximal occlusion, aneurysm, or vascular malformation. Nonstenotic atheromatous change both carotid  siphons. No M1 or M2 disease of significance. Dominant RIGHT A1 ACA. Posterior  circulation: No significant stenosis, proximal occlusion, aneurysm, or vascular malformation. Venous sinuses: As permitted by contrast timing, patent. Anatomic variants: None of significance. Delayed phase: Not performed. Review of the MIP images confirms the above findings IMPRESSION: 1. Slightly greater than 50% stenosis, calcific and soft plaque at the LEFT internal carotid artery origin. This is non flow reducing. 2. No intracranial stenosis or vascular occlusion. 3. Incompletely visualized cloudy airspace opacity RIGHT upper lobe posterior segment, which could represent pneumonia. Small RIGHT pleural effusion. 4. These results were communicated to Dr. Lorraine Gates at [place] 04/24/2020by text page via the [facility] messaging system. Electronically Signed   By: [name] M.D.   On: 05/07/2018 13:20   Ct Angio Neck W Or Wo Contrast  Result Date: 05/21/2018 CLINICAL DATA:  RIGHT-sided weakness. Altered mental status. Facial droop. EXAM: CT ANGIOGRAPHY HEAD AND NECK TECHNIQUE: Multidetector CT imaging of the head and neck was performed using the standard protocol during bolus administration of intravenous contrast. Multiplanar CT image reconstructions and MIPs were obtained to evaluate the vascular anatomy. Carotid stenosis measurements (when applicable) are obtained utilizing NASCET criteria, using the distal internal carotid diameter as the denominator. CONTRAST:  63mL OMNIPAQUE IOHEXOL 350 MG/ML SOLN COMPARISON:  Code stroke CT earlier in the day. FINDINGS: CTA NECK FINDINGS Aortic arch: Standard branching. Imaged portion shows no evidence of aneurysm or dissection. No significant stenosis of the major arch vessel origins. Aortic atherosclerosis. Right carotid system: No evidence of dissection, stenosis (50% or greater) or occlusion. Moderate calcific and soft plaque at the bifurcation. Left carotid system: Slightly greater than 50%  irregular stenosis, calcific and soft plaque, at the RIGHT internal carotid artery origin. This is based on luminal measurements of 1.9/4.1 proximal/distal. No dissection. No intraluminal thrombus. Vertebral arteries: Nonstenotic calcific plaque at both vertebral origins. Both vessels are patent through the neck, codominant. Skeleton: Spondylosis. No worrisome osseous lesion. Other neck: No neck masses. Upper chest: Incompletely visualized cloudlike airspace opacity RIGHT upper lobe posterior segment. Small RIGHT pleural effusion. Review of the MIP images confirms the above findings CTA HEAD FINDINGS Anterior circulation: No significant stenosis, proximal occlusion, aneurysm, or vascular malformation. Nonstenotic atheromatous change both carotid siphons. No M1 or M2 disease of significance. Dominant RIGHT A1 ACA. Posterior circulation: No significant stenosis, proximal occlusion, aneurysm, or vascular malformation. Venous sinuses: As permitted by contrast timing, patent. Anatomic variants: None of significance. Delayed phase: Not performed. Review of the MIP images confirms the above findings IMPRESSION: 1. Slightly greater than 50% stenosis, calcific and soft plaque at the LEFT internal carotid artery origin. This is non flow reducing. 2. No intracranial stenosis or vascular occlusion. 3. Incompletely visualized cloudy airspace opacity RIGHT upper lobe posterior segment, which could represent pneumonia. Small RIGHT pleural effusion. 4. These results were communicated to Dr. Lorraine Gates at [place] 04/22/2020by text page via the [facility] messaging system. Electronically Signed   By: [name] M.D.   On: 05/22/2018 13:20   Mr Brain Wo Contrast  Result Date: 05/23/2018 CLINICAL DATA:  RIGHT-sided weakness now resolved. Altered mental status, possible encephalopathy. COVID-19 positive. EXAM: MRI HEAD WITHOUT CONTRAST TECHNIQUE: Multiplanar, multiecho pulse sequences of the brain and surrounding structures were obtained  without intravenous contrast. COMPARISON:  CTA head neck 05/22/2018. FINDINGS: Brain: No evidence for acute infarction, hemorrhage, mass lesion, hydrocephalus, or extra-axial fluid. Generalized atrophy. Extensive T2 and FLAIR hyperintensities throughout the white matter, likely small vessel disease. Vascular: Normal flow voids. Skull and upper cervical spine: Normal marrow signal. Sinuses/Orbits: Chronic RIGHT  maxillary sinus disease. BILATERAL ethmoid mucosal thickening. Negative orbits. Other: None. IMPRESSION: Atrophy and small vessel disease.  No acute intracranial findings. Electronically Signed   By: [name] M.D.   On: 05/23/2018 16:20   Dg Chest Port 1 View  Result Date: 2018/06/26 CLINICAL DATA:  Shortness of breath EXAM: PORTABLE CHEST 1 VIEW COMPARISON:  06/04/2018 FINDINGS: There has been significant interval increase in heterogeneous pulmonary opacity of the right midlung, with generally unchanged diffuse interstitial opacity elsewhere. Findings are consistent with worsening infection or asymmetric edema. IMPRESSION: There has been significant interval increase in heterogeneous pulmonary opacity of the right midlung, with generally unchanged diffuse interstitial opacity elsewhere. Findings are consistent with worsening infection or asymmetric edema. Electronically Signed   By: Eddie Candle M.D.   On: 26-Jun-2018 08:59   Dg Chest Port 1 View  Result Date: 06/04/2018 CLINICAL DATA:  COVID-19 positive.  Pneumonia EXAM: PORTABLE CHEST 1 VIEW COMPARISON:  05/31/2018 FINDINGS: Normal cardiac silhouette. Low lung volumes. Patchy central airspace disease is similar to comparison exam. No focal consolidation. IMPRESSION: Subtle patchy central airspace disease similar to comparison exam. No significant change. Low lung volumes. Electronically Signed   By: Suzy Bouchard M.D.   On: 06/04/2018 04:45   Dg Chest Port 1 View  Result Date: 05/31/2018 CLINICAL DATA:  Shortness of breath. EXAM: PORTABLE  CHEST 1 VIEW COMPARISON:  Chest x-rays dated 05/30/2018, 05/25/2018, 04/29/2018 and 12/31/2017 and CT scan dated 04/22/2017 FINDINGS: Heart size is normal. Aortic atherosclerosis. There is new hazy density in the right perihilar region with new slight haziness at the left lung base medially. No discrete consolidation or effusions. No acute bone abnormality. IMPRESSION: Small hazy areas of infiltrate in the right perihilar region and at the left base medially. Aortic Atherosclerosis (ICD10-I70.0). Electronically Signed   By: Lorriane Shire M.D.   On: 05/31/2018 09:26   Dg Chest Port 1 View  Result Date: 05/30/2018 CLINICAL DATA:  Shortness of breath EXAM: PORTABLE CHEST 1 VIEW COMPARISON:  05/30/2018 FINDINGS: The heart size and mediastinal contours are within normal limits. Both lungs are clear. The visualized skeletal structures are unremarkable. IMPRESSION: No active disease. Electronically Signed   By: [name]   On: 05/30/2018 15:51   Dg Chest Port 1 View  Result Date: 05/30/2018 CLINICAL DATA:  Shortness of breath. EXAM: PORTABLE CHEST 1 VIEW COMPARISON:  Chest x-ray dated May 25, 2018. FINDINGS: The heart size and mediastinal contours are within normal limits. Atherosclerotic calcification of the aortic arch. Normal pulmonary vascularity. Low lung volumes with mild bibasilar atelectasis. Additional linear atelectasis in the left mid lung. No focal consolidation, pleural effusion, or pneumothorax. No acute osseous abnormality. IMPRESSION: Low lung volumes.  No active disease. Electronically Signed   By: Titus Dubin M.D.   On: 05/30/2018 08:11   Dg Chest Port 1 View  Result Date: 05/25/2018 CLINICAL DATA:  Dyspnea. EXAM: PORTABLE CHEST 1 VIEW COMPARISON:  Radiograph of May 22, 2018. FINDINGS: Stable cardiomediastinal silhouette. No pneumothorax or pleural effusion is noted. Both lungs are clear. The visualized skeletal structures are unremarkable. IMPRESSION: No acute cardiopulmonary  abnormality seen. Aortic Atherosclerosis (ICD10-I70.0). Electronically Signed   By: Marijo Conception M.D.   On: 05/25/2018 07:45   Dg Chest Portable 1 View  Result Date: 05/11/2018 CLINICAL DATA:  78 year old male with altered mental status, lethargic, wheezing EXAM: PORTABLE CHEST 1 VIEW COMPARISON:  Prior chest x-ray 12/31/2017 FINDINGS: Lower inspiratory volumes with minimal bibasilar atelectasis. No evidence of pulmonary edema,  focal airspace consolidation, pleural effusion or pneumothorax. Stable cardiac and mediastinal contours. Atherosclerotic calcifications visualized in the transverse aorta. No acute osseous abnormality. Surgical clips in the right upper quadrant suggest prior cholecystectomy. IMPRESSION: Low inspiratory volumes with bibasilar atelectasis. Otherwise, no acute cardiopulmonary process. Electronically Signed   By: Jacqulynn Cadet M.D.   On: 05/07/2018 13:33   Ct Head Code Stroke Wo Contrast  Result Date: 04/28/2018 CLINICAL DATA:  Code stroke. RIGHT-sided weakness, altered mental status with facial droop. EXAM: CT HEAD WITHOUT CONTRAST TECHNIQUE: Contiguous axial images were obtained from the base of the skull through the vertex without intravenous contrast. COMPARISON:  04/22/2017. FINDINGS: Brain: No evidence for acute infarction, hemorrhage, mass lesion, hydrocephalus, or extra-axial fluid. Generalized atrophy. T2 and FLAIR hyperintensities throughout the white matter, consistent with small vessel disease. Vascular: Calcification of the cavernous internal carotid arteries consistent with cerebrovascular atherosclerotic disease. No signs of intracranial large vessel occlusion. Skull: Normal. Negative for fracture or focal lesion. Sinuses/Orbits: Chronic RIGHT maxillary sinusitis. Negative orbits. Other: None. ASPECTS [facility] Stroke Program Early CT Score) - Ganglionic level infarction (caudate, lentiform nuclei, internal capsule, insula, M1-M3 cortex): 7 - Supraganglionic  infarction (M4-M6 cortex): 3 Total score (0-10 with 10 being normal): 10 IMPRESSION: 1. Atrophy and small vessel disease. No acute intracranial findings. 2. ASPECTS is 10. 3. These results were communicated to Dr. Lorraine Gates at 1:07 pmon 04/26/2020by text page via the [facility] messaging system. * Electronically Signed   By: [name] M.D.   On: 05/22/2018 13:09    Microbiology Recent Results (from the past 240 hour(s))  MRSA PCR Screening     Status: None   Collection Time: 05/31/18  7:27 AM  Result Value Ref Range Status   MRSA by PCR NEGATIVE NEGATIVE Final    Comment:        The GeneXpert MRSA Assay (FDA approved for NASAL specimens only), is one component of a comprehensive MRSA colonization surveillance program. It is not intended to diagnose MRSA infection nor to guide or monitor treatment for MRSA infections. Performed at [facility], [place] [address] [place], [place] [address]   SARS Coronavirus 2 (CEPHEID- Performed in [facility] hospital lab), Hosp Order     Status: Abnormal   Collection Time: 06/01/18 11:15 AM  Result Value Ref Range Status   SARS Coronavirus 2 POSITIVE (A) NEGATIVE Final    Comment: RESULT CALLED TO, READ BACK BY AND VERIFIED WITH: CURRY,E. RN @1448  ON 05.07.2020 BY COHEN,K (NOTE) SARS CoV 2 target nucleic acids are DETECTED. The SARS CoV 2 RNA is generally detectable in upper and lower respiratory specimens during the acute phase of infection. Positive results are indicative of active infection with SARS CoV 2. Clinical  correlation with patient history and other diagnostic information is necessary to determine patient infection status. Positive results do  not rule out bacterial infection or coinfection with other viruses. The expected result is Negative. Fact Sheet for Patients:  https [employer] 371062 download Fact Sheet for Healthcare Providers:  https [employer] media (346) 771-9916 download This test is not yet approved or  cleared by the [place] and  has been authorized for detection and/or diagnosis of SARS CoV 2 by FDA under an Emergency Use Authorization (EUA).  This EUA will remain in effect (meaning this test can be used) fo r the duration of  the COVID19 declaration under Section 564(b)(1) of the Act, 21 U.S.C.  section 360bbb-3(b)(1), unless the authorization is terminated or revoked sooner. Performed  at [facility], [place] [address] [place], [place] [address]     Lab Basic Metabolic Panel: Recent Labs  Lab 05/30/18 0500 05/31/18 0600 06/01/18 0421 06/02/18 0447 06/03/18 2110 06/04/18 0610 06/08/2018 0415  NA 142 140 144 145 153* 153* 153*  K 3.5 3.4* 3.2* 3.6 2.8* 3.5 2.9*  CL 114* 114* 116* 116*  --  119* 120*  CO2 18* 18* 21* 20*  --  24 25  GLUCOSE 127* 132* 106* 145*  --  113* 131*  BUN 24* 21 19 21   --  21 23  CREATININE 1.07 1.08 0.93 0.91  --  0.91 0.95  CALCIUM 7.9* 7.7* 7.7* 7.7*  --  7.6* 7.3*  MG 2.0 2.0 2.0 2.1  --  2.1  --    Liver Function Tests: Recent Labs  Lab 05/30/18 0500 05/31/18 0600 06/01/18 0421 06/02/18 0447 06/08/18 0415  AST 28 25 21 22  36  ALT 22 23 23 22  36  ALKPHOS 51 51 51 60 61  BILITOT 0.5 0.7 0.6 0.6 0.3  PROT 6.0* 5.9* 5.7* 5.8* 4.8*  ALBUMIN 2.3* 2.4* 2.3* 2.2* 1.7*   No results for input(s): LIPASE, AMYLASE in the last 168 hours. No results for input(s): AMMONIA in the last 168 hours. CBC: Recent Labs  Lab 05/30/18 0500 05/31/18 0600 06/01/18 0421 06/02/18 0447 06/03/18 0515 06/03/18 2110 06/04/18 0610 2018-06-08 0415  WBC 10.1 9.6 12.3* 14.6* 10.5  --  10.1 10.3  NEUTROABS 8.1* 7.9* 10.4* 13.0* 8.2*  --   --   --   HGB 13.5 13.0 12.8* 12.4* 12.0* 10.9* 12.1* 11.8*  HCT 42.9 40.2 40.7 38.5* 38.7* 32.0* 39.3 38.2*  MCV 89.6 89.5 89.3 89.5 90.4  --  91.0 93.6  PLT 242 222 206 186 243  --  276 276   Cardiac Enzymes: No results for input(s): CKTOTAL, CKMB, CKMBINDEX, TROPONINI in the last 168  hours. Sepsis Labs: Recent Labs  Lab 06/02/18 0447 06/03/18 0515 06/04/18 0610 06-08-18 0415  PROCALCITON  --  <0.10 <0.10  --   WBC 14.6* 10.5 10.1 10.3    Procedures/Operations  None   Patrecia Pour, MD 08-Jun-2018, 3:44 PM

## 2018-06-26 NOTE — Progress Notes (Signed)
OT Cancellation Note/ Discharge.   Patient Details Name: [name] MRN: [id number] DOB: 1941-01-01   Cancelled Treatment:    Reason Eval/Treat Not Completed: Other (comment)(Per MD note, anticipating hospital death. ) Family coming to hospital to be with pt.   Ramond Dial, OT/L   Acute OT Clinical Specialist Acute Rehabilitation Services Pager [phone] Office [phone]  06/27/18, 12:20 PM

## 2018-06-26 NOTE — Progress Notes (Signed)
ANTICOAGULATION CONSULT NOTE - Follow Up Consult  Pharmacy Consult for Warfarin Indication: Hx PE  Labs: Recent Labs    06/03/18 0515 06/03/18 2110 06/04/18 0610 2018/06/26 0415  HGB 12.0* 10.9* 12.1* 11.8*  HCT 38.7* 32.0* 39.3 38.2*  PLT 243  --  276 276  LABPROT 27.3*  --  25.1* 30.3*  INR 2.6*  --  2.3* 3.0*  CREATININE  --   --  0.91 0.95    Estimated Creatinine Clearance: 67.2 mL/min (by C-G formula based on SCr of 0.95 mg/dL).   Medications:  Scheduled:  . amLODipine  5 mg Oral Daily  . carvedilol  6.25 mg Oral BID WC  . chlorhexidine  15 mL Mouth Rinse BID  . divalproex  125 mg Oral TID  . escitalopram  10 mg Oral Daily  . feeding supplement (ENSURE ENLIVE)  237 mL Oral TID BM  . gabapentin  300 mg Oral TID  . hydrocortisone sod succinate (SOLU-CORTEF) inj  50 mg Intravenous Q12H  . mouth rinse  15 mL Mouth Rinse q12n4p  . sodium chloride flush  3 mL Intravenous Q12H  . thyroid  60 mg Oral Daily  . traZODone  75 mg Oral QHS  . trihexyphenidyl  2 mg Oral Daily  . Warfarin - Pharmacist Dosing Inpatient   Does not apply q1800    Assessment: 72 yom on warfarin PTA for PE/DVT history presenting with AMS, sepsis due to COVID positive. CVA work-up initiated. Noted remote hx of GIB, but no active bleed issues documented.  Warfarin PTA (5mg  MWThSaSu, 2.5mg  Tu,F) for hx of PE. INR back up to 3 after 2 doses of warfarin. Warfarin was held for a week due to elevated INR and restarted on 5/9. Probably caused by poor PO intake. Ddimer only slightly elevated. H/H low stable, Plt wnl   Goal of Therapy:  INR 2-3 Monitor platelets by anticoagulation protocol: Yes   Plan:  Will hold Warfarin today given INR bump and previous trends. Will likely need lower doses moving forward  Monitor daily INR, CBC, s/s of bleed  [name], PharmD., BCPS Clinical Pharmacist Clinical phone for 2018-06-26 until 5pm: [phone] If after 5pm, please refer to [facility] for unit-specific  pharmacist

## 2018-06-26 DEATH — deceased

## 2020-08-02 IMAGING — CT CT HEAD CODE STROKE W/O CM
3 series · 14 of 47 positions shown, 16 images · non-contrast
Comparison: 04/22/2017.

CLINICAL DATA: Code stroke. RIGHT-sided weakness, altered mental
status with facial droop.

EXAM:
CT HEAD WITHOUT CONTRAST
TECHNIQUE: Contiguous axial images were obtained from the base of the skull
through the vertex without intravenous contrast.

[Series 3: head 5.0 h30s · axial · 0.46mm/px · z∈[-137,-7]mm · 8 of 32 slices shown, 10 images]
[im 3/32  brain]
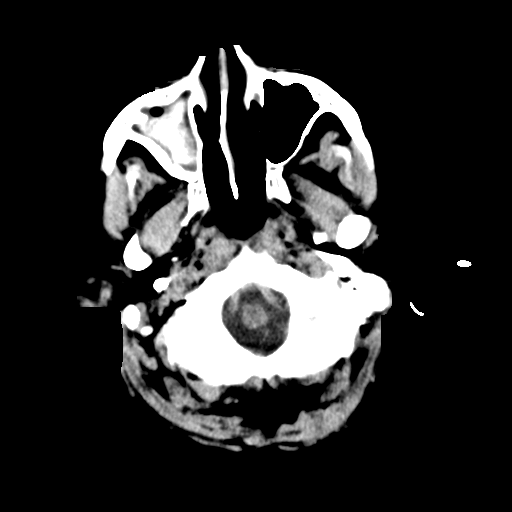
[im 3/32  bone]
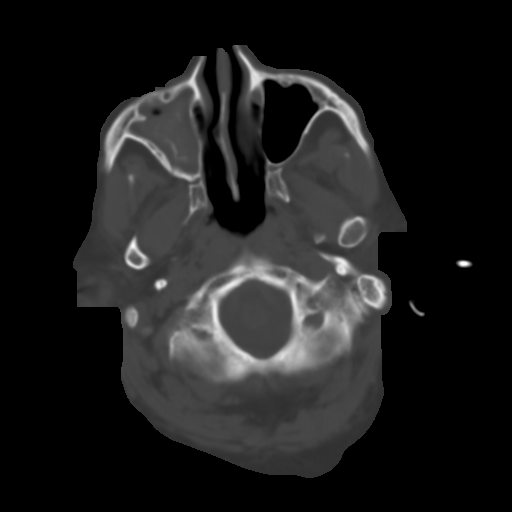
[im 7/32  brain]
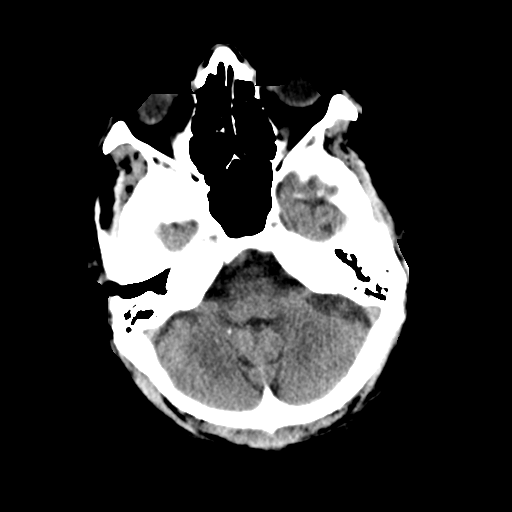
[im 10/32  brain]
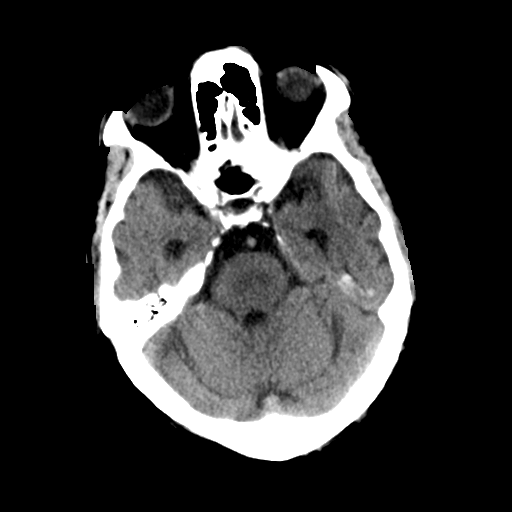
[im 14/32  brain]
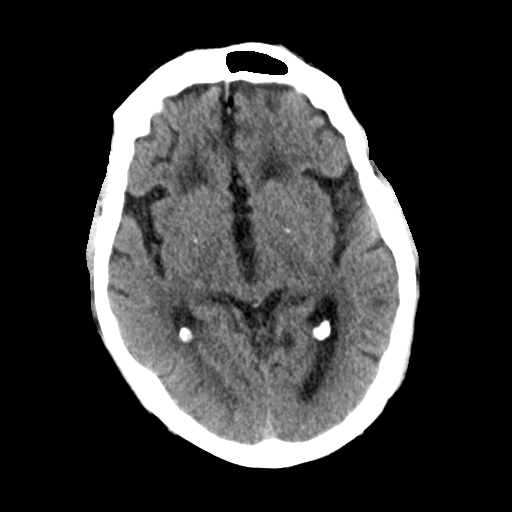
[im 18/32  brain]
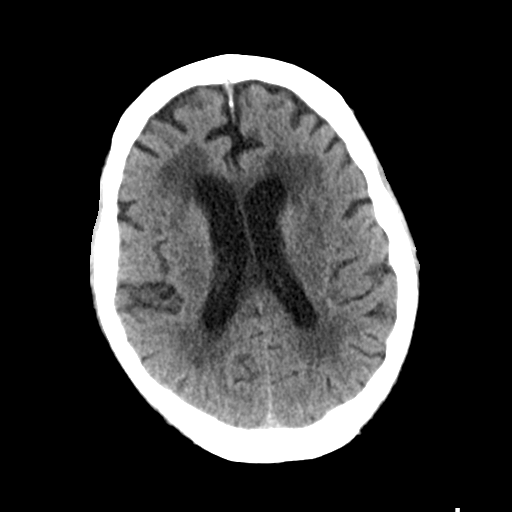
[im 18/32  bone]
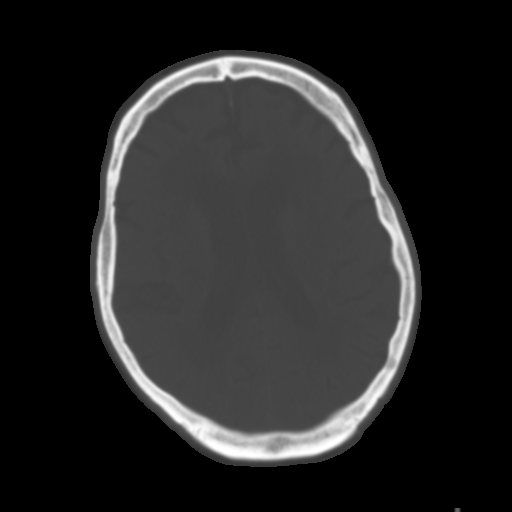
[im 22/32  brain]
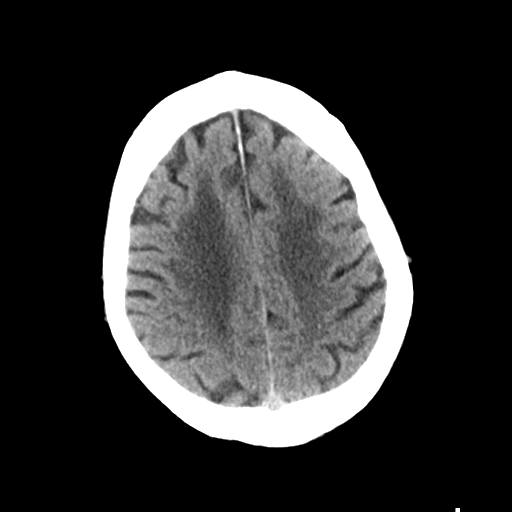
[im 25/32  brain]
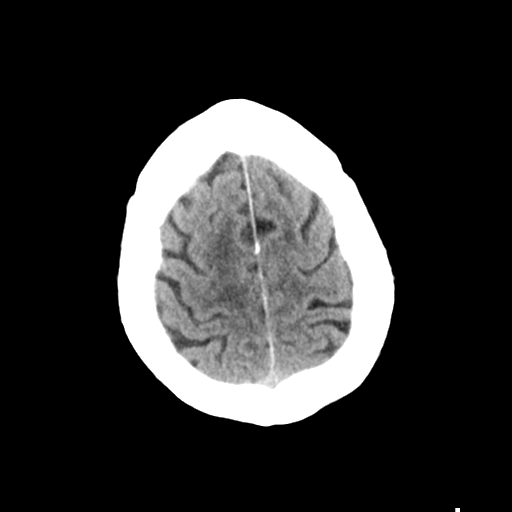
[im 29/32  brain]
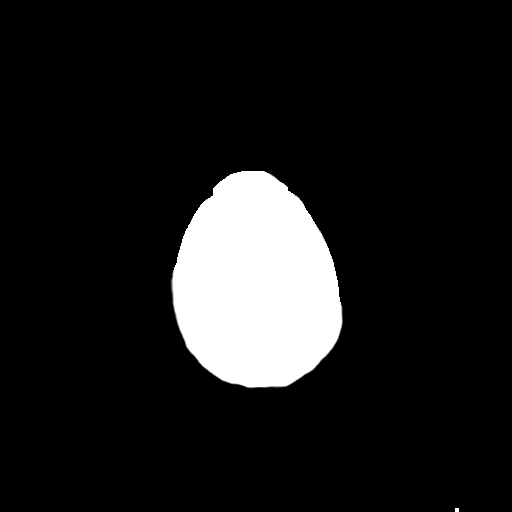

[Series 5: head 3.0 mpr cor · coronal · 0.35mm/px · 3 of 68 slices shown]
[im 23/68  brain]
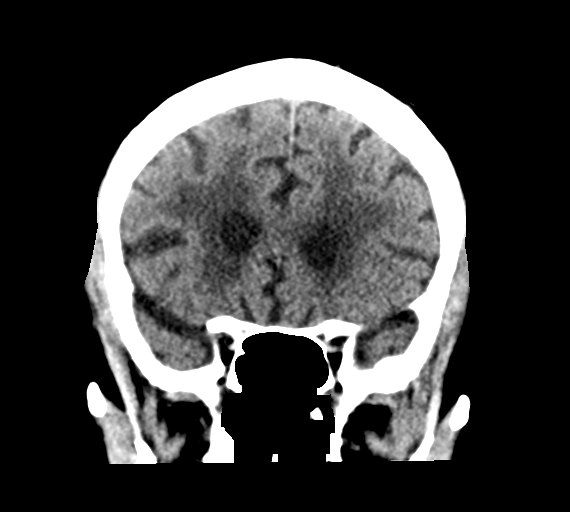
[im 30/68  brain]
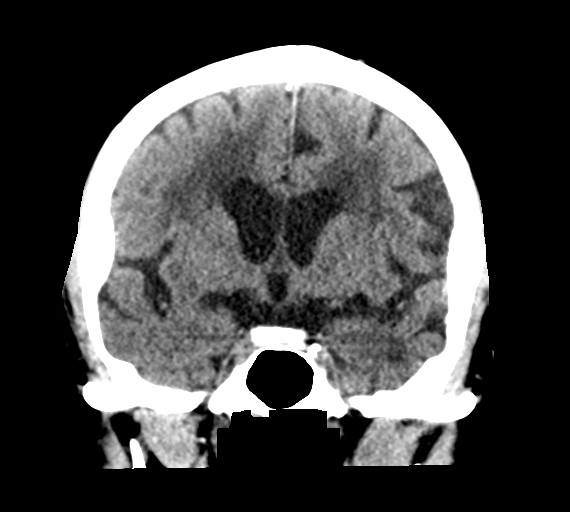
[im 38/68  brain]
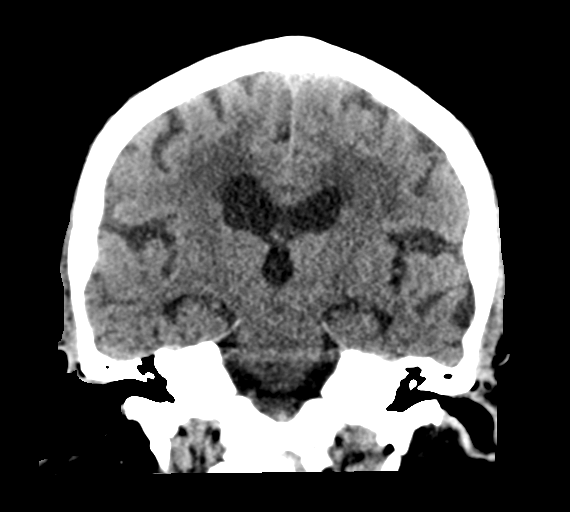

[Series 6: head 3.0 mpr sag · sagittal · 0.33mm/px · 3 of 56 slices shown]
[im 19/56  brain]
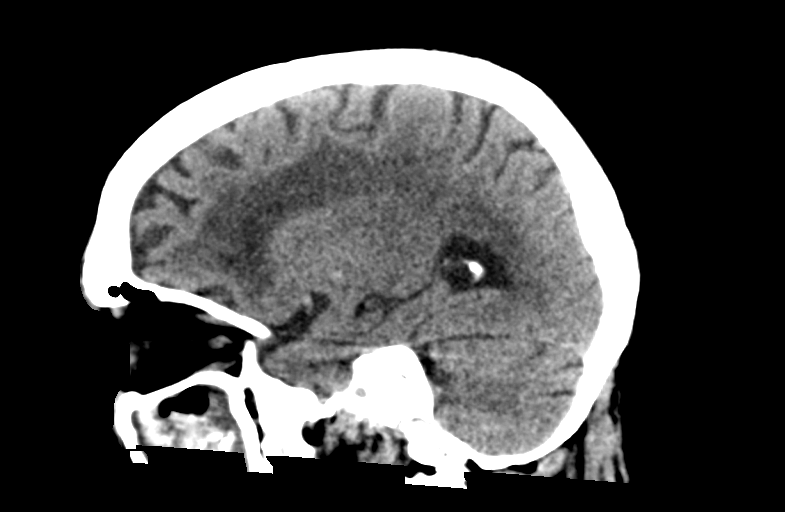
[im 28/56  brain]
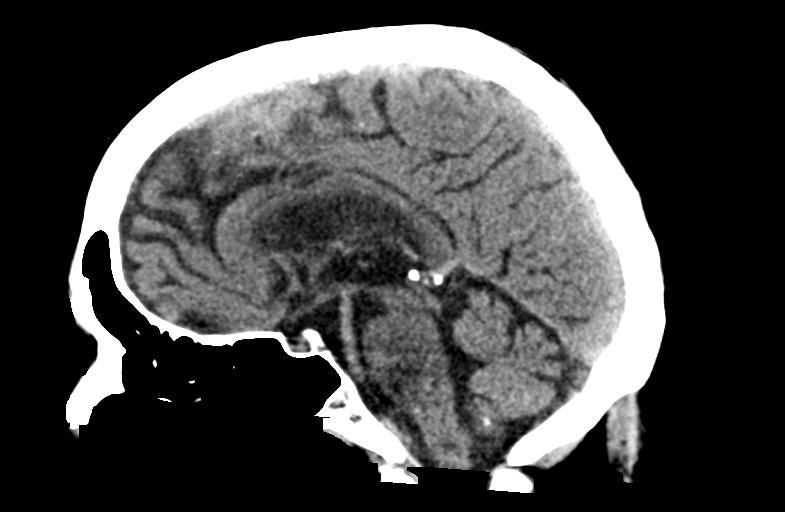
[im 37/56  brain]
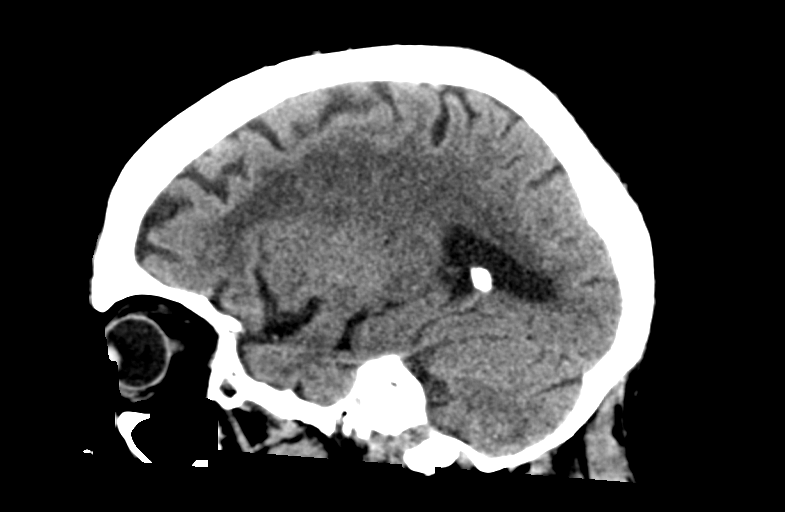

[14 of 47 positions shown; findings below may reference images not displayed]

FINDINGS: Brain: No evidence for acute infarction, hemorrhage, mass lesion,
hydrocephalus, or extra-axial fluid. Generalized atrophy. T2 and
FLAIR hyperintensities throughout the white matter, consistent with
small vessel disease.

Vascular: Calcification of the cavernous internal carotid arteries
consistent with cerebrovascular atherosclerotic disease. No signs of
intracranial large vessel occlusion.

Skull: Normal. Negative for fracture or focal lesion.

Sinuses/Orbits: Chronic RIGHT maxillary sinusitis. Negative orbits.

Other: None.

ASPECTS ([place] Stroke Program Early CT Score)

- Ganglionic level infarction (caudate, lentiform nuclei, internal
capsule, insula, M1-M3 cortex): 7

- Supraganglionic infarction (M4-M6 cortex): 3

Total score (0-10 with 10 being normal): 10
IMPRESSION: 1. Atrophy and small vessel disease. No acute intracranial findings.
2. ASPECTS is 10.
3. These results were communicated to Dr. Nor Laila at [DATE] pmon
05/22/2018by text page via the [employer] messaging system.

*
